# Patient Record
Sex: Male | Born: 1945 | Race: White | Hispanic: No | Marital: Married | Smoking: Former smoker
Health system: Southern US, Community
[De-identification: ages and names within clinical notes are randomized; demographics above are authoritative.]

## PROBLEM LIST (undated history)

## (undated) DIAGNOSIS — J449 Chronic obstructive pulmonary disease, unspecified: Secondary | ICD-10-CM

## (undated) DIAGNOSIS — I6529 Occlusion and stenosis of unspecified carotid artery: Secondary | ICD-10-CM

## (undated) DIAGNOSIS — Z9289 Personal history of other medical treatment: Secondary | ICD-10-CM

## (undated) DIAGNOSIS — E119 Type 2 diabetes mellitus without complications: Secondary | ICD-10-CM

## (undated) DIAGNOSIS — I350 Nonrheumatic aortic (valve) stenosis: Secondary | ICD-10-CM

## (undated) DIAGNOSIS — H269 Unspecified cataract: Secondary | ICD-10-CM

## (undated) DIAGNOSIS — E785 Hyperlipidemia, unspecified: Secondary | ICD-10-CM

## (undated) DIAGNOSIS — E669 Obesity, unspecified: Secondary | ICD-10-CM

## (undated) DIAGNOSIS — I771 Stricture of artery: Secondary | ICD-10-CM

## (undated) DIAGNOSIS — I10 Essential (primary) hypertension: Secondary | ICD-10-CM

## (undated) DIAGNOSIS — I2510 Atherosclerotic heart disease of native coronary artery without angina pectoris: Secondary | ICD-10-CM

## (undated) DIAGNOSIS — N289 Disorder of kidney and ureter, unspecified: Secondary | ICD-10-CM

## (undated) DIAGNOSIS — I4891 Unspecified atrial fibrillation: Secondary | ICD-10-CM

## (undated) DIAGNOSIS — R0600 Dyspnea, unspecified: Secondary | ICD-10-CM

## (undated) HISTORY — DX: Occlusion and stenosis of unspecified carotid artery: I65.29

## (undated) HISTORY — PX: OTHER SURGICAL HISTORY: SHX169

## (undated) HISTORY — DX: Unspecified cataract: H26.9

## (undated) HISTORY — DX: Stricture of artery: I77.1

## (undated) HISTORY — DX: Obesity, unspecified: E66.9

## (undated) HISTORY — DX: Atherosclerotic heart disease of native coronary artery without angina pectoris: I25.10

## (undated) HISTORY — PX: CATARACT EXTRACTION: SUR2

## (undated) HISTORY — DX: Unspecified atrial fibrillation: I48.91

## (undated) HISTORY — PX: CORONARY ARTERY BYPASS GRAFT: SHX141

## (undated) HISTORY — DX: Chronic obstructive pulmonary disease, unspecified: J44.9

## (undated) HISTORY — PX: HERNIA REPAIR: SHX51

## (undated) HISTORY — DX: Disorder of kidney and ureter, unspecified: N28.9

## (undated) HISTORY — DX: Personal history of other medical treatment: Z92.89

## (undated) HISTORY — PX: TONSILLECTOMY: SUR1361

## (undated) HISTORY — DX: Hyperlipidemia, unspecified: E78.5

## (undated) HISTORY — DX: Essential (primary) hypertension: I10

## (undated) HISTORY — DX: Type 2 diabetes mellitus without complications: E11.9

---
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
## 2008-01-11 ENCOUNTER — Emergency Department (HOSPITAL_COMMUNITY): Admission: EM | Admit: 2008-01-11 | Discharge: 2008-01-12 | Payer: Self-pay | Admitting: Emergency Medicine

## 2009-09-29 ENCOUNTER — Ambulatory Visit: Payer: Self-pay | Admitting: Internal Medicine

## 2009-09-29 ENCOUNTER — Inpatient Hospital Stay (HOSPITAL_COMMUNITY): Admission: EM | Admit: 2009-09-29 | Discharge: 2009-10-05 | Payer: Self-pay | Admitting: Emergency Medicine

## 2009-09-30 ENCOUNTER — Ambulatory Visit: Payer: Self-pay | Admitting: Surgery

## 2009-10-07 ENCOUNTER — Telehealth (INDEPENDENT_AMBULATORY_CARE_PROVIDER_SITE_OTHER): Payer: Self-pay | Admitting: *Deleted

## 2009-10-26 ENCOUNTER — Encounter: Payer: Self-pay | Admitting: Cardiovascular Disease

## 2009-10-26 ENCOUNTER — Encounter: Admission: RE | Admit: 2009-10-26 | Discharge: 2009-10-26 | Payer: Self-pay | Admitting: Surgery

## 2009-10-26 ENCOUNTER — Ambulatory Visit: Payer: Self-pay | Admitting: Surgery

## 2009-10-26 DIAGNOSIS — I10 Essential (primary) hypertension: Secondary | ICD-10-CM

## 2009-10-26 DIAGNOSIS — E1159 Type 2 diabetes mellitus with other circulatory complications: Secondary | ICD-10-CM | POA: Insufficient documentation

## 2009-11-10 ENCOUNTER — Ambulatory Visit: Payer: Self-pay | Admitting: Cardiology

## 2009-11-10 DIAGNOSIS — R609 Edema, unspecified: Secondary | ICD-10-CM | POA: Insufficient documentation

## 2009-11-10 DIAGNOSIS — E785 Hyperlipidemia, unspecified: Secondary | ICD-10-CM

## 2009-11-10 DIAGNOSIS — I2510 Atherosclerotic heart disease of native coronary artery without angina pectoris: Secondary | ICD-10-CM | POA: Insufficient documentation

## 2009-11-10 DIAGNOSIS — E1169 Type 2 diabetes mellitus with other specified complication: Secondary | ICD-10-CM | POA: Insufficient documentation

## 2009-12-09 ENCOUNTER — Telehealth (INDEPENDENT_AMBULATORY_CARE_PROVIDER_SITE_OTHER): Payer: Self-pay | Admitting: *Deleted

## 2009-12-14 ENCOUNTER — Telehealth: Payer: Self-pay | Admitting: Cardiology

## 2009-12-15 ENCOUNTER — Ambulatory Visit: Payer: Self-pay | Admitting: Cardiology

## 2010-01-07 ENCOUNTER — Encounter: Payer: Self-pay | Admitting: Cardiology

## 2010-01-11 ENCOUNTER — Telehealth: Payer: Self-pay | Admitting: Cardiology

## 2010-01-12 ENCOUNTER — Ambulatory Visit: Payer: Self-pay | Admitting: Cardiology

## 2010-01-24 ENCOUNTER — Emergency Department (HOSPITAL_COMMUNITY): Admission: EM | Admit: 2010-01-24 | Discharge: 2010-01-24 | Payer: Self-pay | Admitting: Emergency Medicine

## 2010-01-24 ENCOUNTER — Telehealth (INDEPENDENT_AMBULATORY_CARE_PROVIDER_SITE_OTHER): Payer: Self-pay | Admitting: *Deleted

## 2010-01-27 ENCOUNTER — Encounter: Payer: Self-pay | Admitting: Cardiology

## 2010-01-31 ENCOUNTER — Telehealth: Payer: Self-pay | Admitting: Cardiology

## 2010-02-05 ENCOUNTER — Emergency Department (HOSPITAL_COMMUNITY): Admission: EM | Admit: 2010-02-05 | Discharge: 2010-02-05 | Payer: Self-pay | Admitting: Emergency Medicine

## 2010-02-07 ENCOUNTER — Emergency Department (HOSPITAL_COMMUNITY): Admission: EM | Admit: 2010-02-07 | Discharge: 2010-02-07 | Payer: Self-pay | Admitting: Emergency Medicine

## 2010-02-09 ENCOUNTER — Ambulatory Visit: Payer: Self-pay | Admitting: Cardiology

## 2010-02-09 DIAGNOSIS — R229 Localized swelling, mass and lump, unspecified: Secondary | ICD-10-CM | POA: Insufficient documentation

## 2010-03-15 ENCOUNTER — Ambulatory Visit: Payer: Self-pay | Admitting: Surgery

## 2010-07-06 NOTE — Assessment & Plan Note (Signed)
Summary: [facility]   Visit Type:  Follow-up Primary Provider:  Belva Agee, NP  CC:  CAD.  History of Present Illness: The patient presents for followup after CABG. He presented with a non-Q-wave myocardial infarction on April 27. He subsequently was found to have three-vessel coronary artery disease and underwent bypass surgery as described below. He has done relatively well postoperatively although he continues to have significant lower extremity swelling. He has had some mild incisional chest discomfort. He has had no cough fevers or chills. He had none of the chest discomfort that was his previous angina. He denies any palpitations, presyncope or syncope.  Current Medications (verified): 1)  Pravastatin Sodium 40 Mg Tabs (Pravastatin Sodium) .Marland Kitchen.. 1 By Mouth At Bedtime 2)  Klor-Con M20 20 Meq Cr-Tabs (Potassium Chloride Crys Cr) .Marland Kitchen.. 1 By Mouth Daily 3)  Metoprolol Tartrate 25 Mg Tabs (Metoprolol Tartrate) .... 1/2 By Mouth Two Times A Day 4)  Furosemide 40 Mg Tabs (Furosemide) .Marland Kitchen.. 1 By Mouth Daily 5)  Levothroid 25 Mcg Tabs (Levothyroxine Sodium) .Marland Kitchen.. 1 By Mouth Daily  Allergies (verified): No Known Drug Allergies  Past History:  Past Medical History: Hypertension Coronary artery disease Renal insufficiency COPD Obesity  Past Surgical History: Hernia repair Tonsillectomy  CABG( left internal mammary artery   graft to left anterior descending coronary artery, with a saphenous vein   graft to the second obtuse marginal branch of the left circumflex   coronary artery, and a sequential saphenous vein graft to the posterior   descending and posterolateral branches of the right coronary artery.   Endoscopic vein harvesting from the right leg.)  Review of Systems       As stated in the HPI and negative for all other systems.   Vital Signs:  Patient profile:   65 year old male Height:      69 inches Weight:      234 pounds BMI:     34.68 Pulse rate:   76 /  minute Resp:     18 per minute BP sitting:   102 / 68  (right arm)  Vitals Entered By: Marrion Coy, CNA (November 10, 2009 1:39 PM)  Physical Exam  General:  Well developed, well nourished, in no acute distress. Head:  normocephalic and atraumatic Eyes:  PERRLA/EOM intact; conjunctiva and lids normal. Neck:  Neck supple, no JVD. No masses, thyromegaly or abnormal cervical nodes. Chest Wall:  Well-healed sternotomy scar Lungs:  Clear bilaterally to auscultation and percussion. Abdomen:  Bowel sounds positive; abdomen soft and non-tender without masses, organomegaly, or hernias noted. No hepatosplenomegaly, obese Msk:  Back normal, normal gait. Muscle strength and tone normal. Extremities:  moderate to severe bilateral lower extremity edema to the knees Neurologic:  Alert and oriented x 3. Skin:  Intact without lesions or rashes. Cervical Nodes:  no significant adenopathy Psych:  Normal affect.   Detailed Cardiovascular Exam  Neck    Carotids: Carotids full and equal bilaterally without bruits.      Neck Veins: Normal, no JVD.    Heart    Inspection: no deformities or lifts noted.      Palpation: normal PMI with no thrills palpable.      Auscultation: regular rate and rhythm, S1, S2 without murmurs, rubs, gallops, or clicks.    Vascular    Abdominal Aorta: no palpable masses, pulsations, or audible bruits.      Femoral Pulses: normal femoral pulses bilaterally.      Pedal Pulses: normal pedal  pulses bilaterally.      Radial Pulses: normal radial pulses bilaterally.     EKG  Procedure date:  11/10/2009  Findings:      Sinus rhythm, rate 76, axis within normal limits, intervals within normal limits, old anteroseptal infarct  Impression & Recommendations:  Problem # 1:  CORONARY ATHEROSCLEROSIS NATIVE CORONARY ARTERY (ICD-414.01)  The patient is doing well status post CABG. We will continue with risk reduction. He does not want dissipating cardiac  rehabilitation. Orders: EKG w/ Interpretation (93000)  His updated medication list for this problem includes:    Metoprolol Tartrate 25 Mg Tabs (Metoprolol tartrate) .Marland Kitchen... 1/2 by mouth two times a day  Problem # 2:  DYSLIPIDEMIA (ICD-272.4)  his LDL was 168 in the hospital. I doubt that the pravastatin is bringing up to goal but I will check this with a fasting lipid profile. His updated medication list for this problem includes:    Pravastatin Sodium 40 Mg Tabs (Pravastatin sodium) .Marland Kitchen... 1 by mouth at bedtime  His updated medication list for this problem includes:    Pravastatin Sodium 40 Mg Tabs (Pravastatin sodium) .Marland Kitchen... 1 by mouth at bedtime  Problem # 3:  EDEMA (ICD-782.3) I will switch him to torsemide 20 mg daily with potassium 10 mL. He'll get compression stockings. I instructed him on how to keep his feet elevated. I will check a basic metabolic profile in one   Patient Instructions: 1)  Your physician recommends that you schedule a follow-up appointment in: 1 month with Dr Antoine Poche 2)  Your physician recommends that you return for lab work in: 10 days BMP, fasting lipid and liver 3)  Your physician has recommended you make the following change in your medication: Stop Furosemide and start Torsemide 20 mg a day  dedrease potassium chlo to 10 MEq a day Prescriptions: JOBST KNEE HIGH COMPRESSION SM  MISC (ELASTIC BANDAGES & SUPPORTS) 20 to 30 mm/hg  #1 x 0   Entered by:   Charolotte Capuchin, RN   Authorized by:   Rollene Rotunda, MD, [facility]   Signed by:   Charolotte Capuchin, RN on 11/10/2009   Method used:   Electronically to        [facility]  [address] (retail)       [address]       [place], [place]  [address]       Ph: [phone]       Fax: [phone]   RxID:   [id number] TORSEMIDE 20 MG TABS (TORSEMIDE) one daily  #30 x 11   Entered by:   Charolotte Capuchin, RN   Authorized by:   Rollene Rotunda, MD, [facility]   Signed by:   Charolotte Capuchin, RN on 11/10/2009   Method used:   Electronically to        [facility]  [address] (retail)       [address]       [place], [place]  [address]       Ph: [phone]       Fax: [phone]   RxID:   [id number]

## 2010-07-06 NOTE — Progress Notes (Signed)
Summary: work note  Phone Note Call from Patient Call back at [employer] [phone]   Caller: Spouse/Lois Reason for Call: Talk to Nurse Summary of Call: wife needs a note for work.... she is going to be out from 5/3-5/19, while caring for her husband.  Works @ [employer] Initial call taken by: Migdalia Dk,  Oct 07, 2009 9:31 AM  Follow-up for Phone Call        Spoke with wife about leave of absence letter. Advised her to contact Dr. Laneta Simmers office as her husband was just discharged 2 days ago following CABG.  Pt will see Dr. Antoine Poche as 11/03/2009 in [place].  She understands. Lisabeth Devoid RN

## 2010-07-06 NOTE — Progress Notes (Signed)
  Request Recieved from DDS forwarded to [facility]  December 09, 2009 11:19 AM    Appended Document:  Recieved Request from DDS sent to [facility]

## 2010-07-06 NOTE — Letter (Signed)
Summary: [facility]   [facility]   Imported By: Roderic Ovens 12/18/2009 11:29:10  _____________________________________________________________________  External Attachment:    Type:   Image     Comment:   External Document

## 2010-07-06 NOTE — Assessment & Plan Note (Signed)
Summary: [facility]   Visit Type:  Follow-up Primary Provider:  Belva Agee, NP  CC:  Edema and CAD.  History of Present Illness: The patient presents for followup of his lower extremity edema. He had this at the last visit and I increased his diuretics were short period. He had followup laboratories included a normal basic metabolic profile. He also had a lipid profile done recently demonstrates his LDL to be 187 with an HDL of 34. He presents today for followup of the edema. He has not had any chest pressure, neck or upper arm discomfort. He has not had any palpitations, presyncope or syncope. He's had no new shortness of breath, PND or orthopnea.  Of note he has some swelling under his left elbow. He has been to 3 emergency room doctors apparently and had x-rays. The diagnosis has not been forthcoming. He's had some swelling in the antecubital fossa and some discomfort but no fevers or chills. He has no trauma to this area.  Current Medications (verified): 1)  Pravastatin Sodium 40 Mg Tabs (Pravastatin Sodium) .Marland Kitchen.. 1 By Mouth At Bedtime 2)  Klor-Con M20 20 Meq Cr-Tabs (Potassium Chloride Crys Cr) .Marland Kitchen.. 1 By Mouth Daily 3)  Metoprolol Tartrate 25 Mg Tabs (Metoprolol Tartrate) .... 1/2 By Mouth Two Times A Day 4)  Levothroid 25 Mcg Tabs (Levothyroxine Sodium) .Marland Kitchen.. 1 By Mouth Daily 5)  Torsemide 20 Mg Tabs (Torsemide) .... One Daily 6)  Jobst Knee High Compression Sm  Misc (Elastic Bandages & Supports) .... 20 To 30 Mm/hg  Allergies (verified): No Known Drug Allergies  Past History:  Past Medical History: Reviewed history from 11/10/2009 and no changes required. Hypertension Coronary artery disease Renal insufficiency COPD Obesity  Past Surgical History: Reviewed history from 11/10/2009 and no changes required. Hernia repair Tonsillectomy  CABG( left internal mammary artery   graft to left anterior descending coronary artery, with a saphenous vein   graft to the second  obtuse marginal branch of the left circumflex   coronary artery, and a sequential saphenous vein graft to the posterior   descending and posterolateral branches of the right coronary artery.   Endoscopic vein harvesting from the right leg.)  Review of Systems       As stated in the HPI and negative for all other systems.   Vital Signs:  Patient profile:   65 year old male Height:      69 inches Weight:      253 pounds BMI:     37.50 Pulse rate:   72 / minute Resp:     18 per minute BP sitting:   120 / 68  (right arm)  Vitals Entered By: Marrion Coy, CNA (February 09, 2010 2:40 PM)  Physical Exam  General:  Well developed, well nourished, in no acute distress. Head:  normocephalic and atraumatic Eyes:  PERRLA/EOM intact; conjunctiva and lids normal. Mouth:  edentulous.. Oral mucosa normal. Neck:  Neck supple, no JVD. No masses, thyromegaly or abnormal cervical nodes. Chest Wall:  Well-healed sternotomy scar Lungs:  Clear bilaterally to auscultation and percussion. Abdomen:  Bowel sounds positive; abdomen soft and non-tender without masses, organomegaly, or hernias noted. No hepatosplenomegaly, obese Msk:  Back normal, normal gait. Muscle strength and tone normal, There is a firm 3 x 7 cm swelling under the left elbow that is nonfluctuant and nontender Extremities:  Mildto severe bilateral lower extremity edema to the knees Neurologic:  Alert and oriented x 3. Skin:  Intact without lesions or  rashes. Cervical Nodes:  no significant adenopathy Inguinal Nodes:  no significant adenopathy Psych:  Normal affect.   Detailed Cardiovascular Exam  Neck    Carotids: Carotids full and equal bilaterally without bruits.      Neck Veins: Normal, no JVD.    Heart    Inspection: no deformities or lifts noted.      Palpation: normal PMI with no thrills palpable.      Auscultation: regular rate and rhythm, S1, S2 without murmurs, rubs, gallops, or clicks.    Vascular    Abdominal  Aorta: no palpable masses, pulsations, or audible bruits.      Femoral Pulses: normal femoral pulses bilaterally.      Pedal Pulses: normal pedal pulses bilaterally.      Radial Pulses: normal radial pulses bilaterally.     Impression & Recommendations:  Problem # 1:  EDEMA (ICD-782.3) His edema is much improved. He will continue on the meds as listed above in salt and keeping his feet elevated. He will wear his compression stockings.  Problem # 2:  DYSLIPIDEMIA (ICD-272.4) His lipid profile is not at target. He can only afford generics. I will switch him to simvastatin 40 mg daily and check a lipid profile with liver enzymes in 10 weeks.  Problem # 3:  HYPERTENSION (ICD-401.9) His blood pressure is controlled. He'll continue the meds as listed.  Problem # 4:  CORONARY ATHEROSCLEROSIS NATIVE CORONARY ARTERY (ICD-414.01) He has no new symptoms. He will continue the meds as listed.  Problem # 5:  LOCALIZED SUPERFICIAL SWELLING MASS OR LUMP (ICD-782.2) I have suggested that he followup with an orthopedist and then an MRI might be indicated.  Patient Instructions: 1)  Your physician recommends that you schedule a follow-up appointment in: 6 months with Dr Antoine Poche 2)  Your physician recommends that you return for a FASTING lipid and liver profile: in 10 weeks 3)  Your physician has recommended you make the following change in your medication: Stop Pravastatin and start Simvastatin 40 mg one a day Prescriptions: SIMVASTATIN 40 MG TABS (SIMVASTATIN) one daily  #90 x 3   Entered by:   Charolotte Capuchin, RN   Authorized by:   Rollene Rotunda, MD, [facility]   Signed by:   Charolotte Capuchin, RN on 02/09/2010   Method used:   Electronically to        [facility]  [address] (retail)       [address]       [place], [place]  [address]       Ph: [phone]       Fax: [phone]   RxID:   [id number]  I have reviewed and approved all prescriptions at the time of this  visit. Rollene Rotunda, MD, [facility]  February 09, 2010 3:21 PM

## 2010-07-06 NOTE — Progress Notes (Signed)
Summary: pt needs to have dental work done  Phone Note Call from Patient Call back at [employer] [phone]   Caller: Patient Reason for Call: Talk to Nurse, Talk to Doctor Summary of Call: pt needs to have teeth pulled and needs approval and needs to know if he needs pre meds Initial call taken by: Omer Jack,  December 14, 2009 4:01 PM  Follow-up for Phone Call        [facility] for surgery.  No need for SBE prophylaxis. Follow-up by: Rollene Rotunda, MD, [facility],  December 14, 2009 6:01 PM  Additional Follow-up for Phone Call Additional follow up Details #1::        pt aware of Dr Shontez Sermon's orders and states understanding Additional Follow-up by: Charolotte Capuchin, RN,  December 14, 2009 6:11 PM

## 2010-07-06 NOTE — Progress Notes (Signed)
  DDS request recieved, sent to [facility]. Cala Bradford Mesiemore  January 24, 2010 9:16 AM

## 2010-07-06 NOTE — Progress Notes (Signed)
Summary: pt having chest pain  Phone Note Call from Patient Call back at Home Phone [phone]   Caller: Patient Reason for Call: Talk to Nurse, Talk to Doctor Summary of Call: pt having chest pain and numdness in hands and arms Initial call taken by: Omer Jack,  January 11, 2010 11:52 AM  Follow-up for Phone Call        has been having chest pain for the past week, comes and goes described as "tightness"  doesn't feel he needs to go to ER, pt also complains that his chest swells up sometimes, he feels the left side gets bigger than the right, sch appt w/Dr Antoine Poche tom in [facility], RN  January 11, 2010 12:02 PM

## 2010-07-06 NOTE — Assessment & Plan Note (Signed)
Summary: [facility]   Visit Type:  Follow-up Primary Drayden Lukas:  Belva Agee, NP  CC:  CAD.  History of Present Illness: The patient presents for followup of his known coronary disease. He is status post CABG. Since I last saw him he has had none of the chest pain that was his previous angina. He has had some atypical chest discomfort some associated with movement or position possibly related to his sternal incision. He continues to have dyspnea which is episodic. He is not describing PND or orthopnea. He does have increased lower extremity swelling compared with previous. He does not keep his feet elevated during the day. He doesn't add salt but he doesn't avoid it either.  Current Medications (verified): 1)  Pravastatin Sodium 40 Mg Tabs (Pravastatin Sodium) .Marland Kitchen.. 1 By Mouth At Bedtime 2)  Klor-Con M20 20 Meq Cr-Tabs (Potassium Chloride Crys Cr) .Marland Kitchen.. 1 By Mouth Daily 3)  Metoprolol Tartrate 25 Mg Tabs (Metoprolol Tartrate) .... 1/2 By Mouth Two Times A Day 4)  Levothroid 25 Mcg Tabs (Levothyroxine Sodium) .Marland Kitchen.. 1 By Mouth Daily 5)  Torsemide 20 Mg Tabs (Torsemide) .... One Daily 6)  Jobst Knee High Compression Sm  Misc (Elastic Bandages & Supports) .... 20 To 30 Mm/hg  Allergies (verified): No Known Drug Allergies  Past History:  Past Medical History: Reviewed history from 11/10/2009 and no changes required. Hypertension Coronary artery disease Renal insufficiency COPD Obesity  Past Surgical History: Reviewed history from 11/10/2009 and no changes required. Hernia repair Tonsillectomy  CABG( left internal mammary artery   graft to left anterior descending coronary artery, with a saphenous vein   graft to the second obtuse marginal branch of the left circumflex   coronary artery, and a sequential saphenous vein graft to the posterior   descending and posterolateral branches of the right coronary artery.   Endoscopic vein harvesting from the right leg.)  Review of  Systems       As stated in the HPI and negative for all other systems.   Vital Signs:  Patient profile:   65 year old male Height:      69 inches Weight:      251 pounds BMI:     37.20 Pulse rate:   69 / minute Resp:     20 per minute BP sitting:   110 / 84  (right arm)  Vitals Entered By: Marrion Coy, CNA (January 12, 2010 1:55 PM)  Physical Exam  General:  Well developed, well nourished, in no acute distress. Head:  normocephalic and atraumatic Eyes:  PERRLA/EOM intact; conjunctiva and lids normal. Mouth:  edentulous.. Oral mucosa normal. Neck:  Neck supple, no JVD. No masses, thyromegaly or abnormal cervical nodes. Chest Wall:  Well-healed sternotomy scar Lungs:  Clear bilaterally to auscultation and percussion. Abdomen:  Bowel sounds positive; abdomen soft and non-tender without masses, organomegaly, or hernias noted. No hepatosplenomegaly, obese Msk:  Back normal, normal gait. Muscle strength and tone normal. Extremities:  moderate to severe bilateral lower extremity edema to the knees Neurologic:  Alert and oriented x 3. Skin:  Intact without lesions or rashes. Cervical Nodes:  no significant adenopathy Inguinal Nodes:  no significant adenopathy Psych:  Normal affect.   Detailed Cardiovascular Exam  Neck    Carotids: Carotids full and equal bilaterally without bruits.      Neck Veins: Normal, no JVD.    Heart    Inspection: no deformities or lifts noted.      Palpation: normal PMI with no  thrills palpable.      Auscultation: regular rate and rhythm, S1, S2 without murmurs, rubs, gallops, or clicks.    Vascular    Abdominal Aorta: no palpable masses, pulsations, or audible bruits.      Femoral Pulses: normal femoral pulses bilaterally.      Pedal Pulses: normal pedal pulses bilaterally.      Radial Pulses: normal radial pulses bilaterally.     EKG  Procedure date:  01/12/2010  Findings:      Sinus rhythm, rate 69, axis within normal limits, intervals  within normal limits, premature atrial contractions, nonspecific T-wave flattening.  Impression & Recommendations:  Problem # 1:  CORONARY ATHEROSCLEROSIS NATIVE CORONARY ARTERY (ICD-414.01) The patient has no new chest pain consistent with angina. He will continue with risk reduction. Orders: EKG w/ Interpretation (93000)  Problem # 2:  DYSLIPIDEMIA (ICD-272.4)  When he comes back L. make sure he has a fasting lipid profile and dose adjustment of his statin to get his LDL less than 100 and HDL greater than 40.  Problem # 3:  HYPERTENSION (ICD-401.9)  His blood pressure is controlled. He will continue the meds as listed. Orders: EKG w/ Interpretation (93000)  His updated medication list for this problem includes:    Metoprolol Tartrate 25 Mg Tabs (Metoprolol tartrate) .Marland Kitchen... 1/2 by mouth two times a day    Torsemide 20 Mg Tabs (Torsemide) ..... One daily  Problem # 4:  EDEMA (ICD-782.3) He has continued lower extremity edema. We discussed salt restriction and keeping his feet elevated. For 3 days he will take torsemide 40 mg and double his potassium. He will get a basic metabolic profile after this and go back to 20 mg daily  Patient Instructions: 1)  Your physician recommends that you schedule a follow-up appointment October 19, 2011at 12N 2)  Your physician has recommended you make the following change in your medication: for the next three days, double your Torsemide and take one whole tablet of potassium chlo.  Have basic metabolic panel in 2 weeks  401.1  v58.69

## 2010-07-06 NOTE — Progress Notes (Signed)
Summary: pt needs refill  Phone Note Refill Request Call back at Home Phone [phone] Message from:  Patient on [facility]  Refills Requested: Medication #1:  KLOR-CON M20 20 MEQ CR-TABS 1 by mouth daily Initial call taken by: Omer Jack,  January 31, 2010 12:39 PM  Follow-up for Phone Call        RX sent into pharmacy. Pt notified Marrion Coy, CNA  January 31, 2010 1:00 PM  Follow-up by: Marrion Coy, CNA,  January 31, 2010 1:00 PM    Prescriptions: KLOR-CON M20 20 MEQ CR-TABS (POTASSIUM CHLORIDE CRYS CR) 1 by mouth daily  #30 x 6   Entered by:   Marrion Coy, CNA   Authorized by:   Rollene Rotunda, MD, [facility]   Signed by:   Marrion Coy, CNA on 01/31/2010   Method used:   Electronically to        [facility]  [address] (retail)       [address]       [place], [place]  [address]       Ph: [phone]       Fax: [phone]   RxID:   [id number]

## 2010-08-17 ENCOUNTER — Encounter: Payer: Self-pay | Admitting: Cardiology

## 2010-08-17 ENCOUNTER — Ambulatory Visit (INDEPENDENT_AMBULATORY_CARE_PROVIDER_SITE_OTHER): Payer: Self-pay | Admitting: Cardiology

## 2010-08-17 DIAGNOSIS — R609 Edema, unspecified: Secondary | ICD-10-CM

## 2010-08-17 DIAGNOSIS — I2510 Atherosclerotic heart disease of native coronary artery without angina pectoris: Secondary | ICD-10-CM

## 2010-08-22 ENCOUNTER — Telehealth: Payer: Self-pay | Admitting: Cardiology

## 2010-08-23 LAB — BASIC METABOLIC PANEL
BUN: 13 mg/dL (ref 6–23)
BUN: 17 mg/dL (ref 6–23)
BUN: 11 mg/dL (ref 6–23)
BUN: 16 mg/dL (ref 6–23)
BUN: 7 mg/dL (ref 6–23)
CO2: 29 mEq/L (ref 19–32)
CO2: 33 mEq/L — ABNORMAL HIGH (ref 19–32)
CO2: 24 mEq/L (ref 19–32)
CO2: 25 mEq/L (ref 19–32)
CO2: 26 mEq/L (ref 19–32)
Calcium: 8.2 mg/dL — ABNORMAL LOW (ref 8.4–10.5)
Calcium: 8.2 mg/dL — ABNORMAL LOW (ref 8.4–10.5)
Calcium: 7.8 mg/dL — ABNORMAL LOW (ref 8.4–10.5)
Calcium: 8.6 mg/dL (ref 8.4–10.5)
Calcium: 8.8 mg/dL (ref 8.4–10.5)
Chloride: 102 mEq/L (ref 96–112)
Chloride: 99 mEq/L (ref 96–112)
Chloride: 105 mEq/L (ref 96–112)
Chloride: 106 mEq/L (ref 96–112)
Chloride: 109 mEq/L (ref 96–112)
Creatinine, Ser: 0.9 mg/dL (ref 0.4–1.5)
Creatinine, Ser: 0.98 mg/dL (ref 0.4–1.5)
Creatinine, Ser: 0.85 mg/dL (ref 0.4–1.5)
Creatinine, Ser: 0.88 mg/dL (ref 0.4–1.5)
Creatinine, Ser: 0.98 mg/dL (ref 0.4–1.5)
GFR calc Af Amer: >60 mL/min (ref >60)
GFR calc Af Amer: >60 mL/min (ref >60)
GFR calc Af Amer: >60 mL/min (ref >60)
GFR calc Af Amer: >60 mL/min (ref >60)
GFR calc Af Amer: >60 mL/min (ref >60)
GFR calc non Af Amer: >60 mL/min (ref >60)
GFR calc non Af Amer: >60 mL/min (ref >60)
GFR calc non Af Amer: >60 mL/min (ref >60)
GFR calc non Af Amer: >60 mL/min (ref >60)
GFR calc non Af Amer: >60 mL/min (ref >60)
Glucose, Bld: 121 mg/dL — ABNORMAL HIGH (ref 70–99)
Glucose, Bld: 151 mg/dL — ABNORMAL HIGH (ref 70–99)
Glucose, Bld: 119 mg/dL — ABNORMAL HIGH (ref 70–99)
Glucose, Bld: 119 mg/dL — ABNORMAL HIGH (ref 70–99)
Glucose, Bld: 129 mg/dL — ABNORMAL HIGH (ref 70–99)
Potassium: 3.9 mEq/L (ref 3.5–5.1)
Potassium: 4.3 mEq/L (ref 3.5–5.1)
Potassium: 3.8 mEq/L (ref 3.5–5.1)
Potassium: 4 mEq/L (ref 3.5–5.1)
Potassium: 4.5 mEq/L (ref 3.5–5.1)
Sodium: 137 mEq/L (ref 135–145)
Sodium: 139 mEq/L (ref 135–145)
Sodium: 137 mEq/L (ref 135–145)
Sodium: 138 mEq/L (ref 135–145)
Sodium: 139 mEq/L (ref 135–145)

## 2010-08-23 LAB — CBC
HCT: 35.9 % — ABNORMAL LOW (ref 39.0–52.0)
HCT: 36 % — ABNORMAL LOW (ref 39.0–52.0)
HCT: 31.5 % — ABNORMAL LOW (ref 39.0–52.0)
HCT: 32.4 % — ABNORMAL LOW (ref 39.0–52.0)
HCT: 35.9 % — ABNORMAL LOW (ref 39.0–52.0)
HCT: 39.3 % (ref 39.0–52.0)
HCT: 40.5 % (ref 39.0–52.0)
HCT: 42.7 % (ref 39.0–52.0)
Hemoglobin: 12.6 g/dL — ABNORMAL LOW (ref 13.0–17.0)
Hemoglobin: 12.6 g/dL — ABNORMAL LOW (ref 13.0–17.0)
Hemoglobin: 11 g/dL — ABNORMAL LOW (ref 13.0–17.0)
Hemoglobin: 11.4 g/dL — ABNORMAL LOW (ref 13.0–17.0)
Hemoglobin: 12.7 g/dL — ABNORMAL LOW (ref 13.0–17.0)
Hemoglobin: 13.5 g/dL (ref 13.0–17.0)
Hemoglobin: 14.2 g/dL (ref 13.0–17.0)
Hemoglobin: 15 g/dL (ref 13.0–17.0)
MCHC: 34.9 g/dL (ref 30.0–36.0)
MCHC: 35.1 g/dL (ref 30.0–36.0)
MCHC: 34.3 g/dL (ref 30.0–36.0)
MCHC: 34.9 g/dL (ref 30.0–36.0)
MCHC: 35 g/dL (ref 30.0–36.0)
MCHC: 35.1 g/dL (ref 30.0–36.0)
MCHC: 35.3 g/dL (ref 30.0–36.0)
MCHC: 35.3 g/dL (ref 30.0–36.0)
MCV: 91.9 fL (ref 78.0–100.0)
MCV: 92.1 fL (ref 78.0–100.0)
MCV: 91 fL (ref 78.0–100.0)
MCV: 91.2 fL (ref 78.0–100.0)
MCV: 91.3 fL (ref 78.0–100.0)
MCV: 91.4 fL (ref 78.0–100.0)
MCV: 91.7 fL (ref 78.0–100.0)
MCV: 92.3 fL (ref 78.0–100.0)
Platelets: 138 10*3/uL — ABNORMAL LOW (ref 150–400)
Platelets: 144 10*3/uL — ABNORMAL LOW (ref 150–400)
Platelets: 125 10*3/uL — ABNORMAL LOW (ref 150–400)
Platelets: 126 10*3/uL — ABNORMAL LOW (ref 150–400)
Platelets: 139 10*3/uL — ABNORMAL LOW (ref 150–400)
Platelets: 140 10*3/uL — ABNORMAL LOW (ref 150–400)
Platelets: 143 10*3/uL — ABNORMAL LOW (ref 150–400)
Platelets: 147 10*3/uL — ABNORMAL LOW (ref 150–400)
RBC: 3.91 MIL/uL — ABNORMAL LOW (ref 4.22–5.81)
RBC: 3.91 MIL/uL — ABNORMAL LOW (ref 4.22–5.81)
RBC: 3.42 MIL/uL — ABNORMAL LOW (ref 4.22–5.81)
RBC: 3.55 MIL/uL — ABNORMAL LOW (ref 4.22–5.81)
RBC: 3.93 MIL/uL — ABNORMAL LOW (ref 4.22–5.81)
RBC: 4.29 MIL/uL (ref 4.22–5.81)
RBC: 4.44 MIL/uL (ref 4.22–5.81)
RBC: 4.69 MIL/uL (ref 4.22–5.81)
RDW: 13.8 % (ref 11.5–15.5)
RDW: 13.8 % (ref 11.5–15.5)
RDW: 13.4 % (ref 11.5–15.5)
RDW: 13.5 % (ref 11.5–15.5)
RDW: 13.5 % (ref 11.5–15.5)
RDW: 13.6 % (ref 11.5–15.5)
RDW: 13.6 % (ref 11.5–15.5)
RDW: 13.7 % (ref 11.5–15.5)
WBC: 12.1 10*3/uL — ABNORMAL HIGH (ref 4.0–10.5)
WBC: 12.1 10*3/uL — ABNORMAL HIGH (ref 4.0–10.5)
WBC: 12.5 10*3/uL — ABNORMAL HIGH (ref 4.0–10.5)
WBC: 7 10*3/uL (ref 4.0–10.5)
WBC: 8.1 10*3/uL (ref 4.0–10.5)
WBC: 8.4 10*3/uL (ref 4.0–10.5)
WBC: 8.6 10*3/uL (ref 4.0–10.5)
WBC: 8.6 10*3/uL (ref 4.0–10.5)

## 2010-08-23 LAB — APTT
aPTT: 32 seconds (ref 24–37)
aPTT: 47 seconds — ABNORMAL HIGH (ref 24–37)

## 2010-08-23 LAB — HEMOGLOBIN A1C
Hgb A1c MFr Bld: 5.9 % — ABNORMAL HIGH (ref <5.7)
Hgb A1c MFr Bld: 5.9 % — ABNORMAL HIGH (ref <5.7)
Hgb A1c MFr Bld: 6.1 % — ABNORMAL HIGH (ref <5.7)
Mean Plasma Glucose: 123 mg/dL — ABNORMAL HIGH (ref <117)
Mean Plasma Glucose: 123 mg/dL — ABNORMAL HIGH (ref <117)
Mean Plasma Glucose: 128 mg/dL — ABNORMAL HIGH (ref <117)

## 2010-08-23 LAB — COMPREHENSIVE METABOLIC PANEL
ALT: 22 U/L (ref 0–53)
AST: 21 U/L (ref 0–37)
Albumin: 3.5 g/dL (ref 3.5–5.2)
Alkaline Phosphatase: 60 U/L (ref 39–117)
BUN: 12 mg/dL (ref 6–23)
CO2: 25 mEq/L (ref 19–32)
Calcium: 8.3 mg/dL — ABNORMAL LOW (ref 8.4–10.5)
Chloride: 107 mEq/L (ref 96–112)
Creatinine, Ser: 0.87 mg/dL (ref 0.4–1.5)
GFR calc Af Amer: >60 mL/min (ref >60)
GFR calc non Af Amer: >60 mL/min (ref >60)
Glucose, Bld: 102 mg/dL — ABNORMAL HIGH (ref 70–99)
Potassium: 3.6 mEq/L (ref 3.5–5.1)
Sodium: 136 mEq/L (ref 135–145)
Total Bilirubin: 0.7 mg/dL (ref 0.3–1.2)
Total Protein: 6.1 g/dL (ref 6.0–8.3)

## 2010-08-23 LAB — POCT I-STAT 3, VENOUS BLOOD GAS (G3P V)
Acid-base deficit: 2 mmol/L (ref 0.0–2.0)
Bicarbonate: 23.3 mEq/L (ref 20.0–24.0)
O2 Saturation: 72 %
TCO2: 25 mmol/L (ref 0–100)
pCO2, Ven: 42.7 mmHg — ABNORMAL LOW (ref 45.0–50.0)
pH, Ven: 7.345 — ABNORMAL HIGH (ref 7.250–7.300)
pO2, Ven: 40 mmHg (ref 30.0–45.0)

## 2010-08-23 LAB — URINALYSIS, ROUTINE W REFLEX MICROSCOPIC
Bilirubin Urine: NEGATIVE
Glucose, UA: NEGATIVE mg/dL
Hgb urine dipstick: NEGATIVE
Ketones, ur: NEGATIVE mg/dL
Nitrite: NEGATIVE
Protein, ur: NEGATIVE mg/dL
Specific Gravity, Urine: 1.017 (ref 1.005–1.030)
Urobilinogen, UA: 1 mg/dL (ref 0.0–1.0)
pH: 6 (ref 5.0–8.0)

## 2010-08-23 LAB — TROPONIN I: Troponin I: 0.32 ng/mL — ABNORMAL HIGH (ref 0.00–0.06)

## 2010-08-23 LAB — BLOOD GAS, ARTERIAL
Acid-Base Excess: 0.8 mmol/L (ref 0.0–2.0)
Bicarbonate: 25 mEq/L — ABNORMAL HIGH (ref 20.0–24.0)
FIO2: 0.21 %
O2 Saturation: 93.9 %
Patient temperature: 98.6
TCO2: 26.2 mmol/L (ref 0–100)
pCO2 arterial: 40.7 mmHg (ref 35.0–45.0)
pH, Arterial: 7.405 (ref 7.350–7.450)
pO2, Arterial: 68.1 mmHg — ABNORMAL LOW (ref 80.0–100.0)

## 2010-08-23 LAB — POCT CARDIAC MARKERS
CKMB, poc: 3 ng/mL (ref 1.0–8.0)
CKMB, poc: 3.3 ng/mL (ref 1.0–8.0)
Myoglobin, poc: 102 ng/mL (ref 12–200)
Myoglobin, poc: 75 ng/mL (ref 12–200)
Troponin i, poc: 0.06 ng/mL (ref 0.00–0.09)
Troponin i, poc: 0.13 ng/mL — ABNORMAL HIGH (ref 0.00–0.09)

## 2010-08-23 LAB — HEMOGLOBIN AND HEMATOCRIT, BLOOD
HCT: 28 % — ABNORMAL LOW (ref 39.0–52.0)
Hemoglobin: 9.9 g/dL — ABNORMAL LOW (ref 13.0–17.0)

## 2010-08-23 LAB — LIPID PANEL
Cholesterol: 218 mg/dL — ABNORMAL HIGH (ref 0–200)
Cholesterol: 230 mg/dL — ABNORMAL HIGH (ref 0–200)
HDL: 33 mg/dL — ABNORMAL LOW (ref >39)
HDL: 37 mg/dL — ABNORMAL LOW (ref >39)
LDL Cholesterol: 165 mg/dL — ABNORMAL HIGH (ref 0–99)
LDL Cholesterol: 182 mg/dL — ABNORMAL HIGH (ref 0–99)
Total CHOL/HDL Ratio: 5.9 RATIO
Total CHOL/HDL Ratio: 7 RATIO
Triglycerides: 76 mg/dL (ref <150)
Triglycerides: 82 mg/dL (ref <150)
VLDL: 15 mg/dL (ref 0–40)
VLDL: 16 mg/dL (ref 0–40)

## 2010-08-23 LAB — POCT I-STAT 3, ART BLOOD GAS (G3+)
Acid-Base Excess: 1 mmol/L (ref 0.0–2.0)
Acid-Base Excess: 2 mmol/L (ref 0.0–2.0)
Acid-base deficit: 1 mmol/L (ref 0.0–2.0)
Acid-base deficit: 1 mmol/L (ref 0.0–2.0)
Bicarbonate: 24 mEq/L (ref 20.0–24.0)
Bicarbonate: 24.2 mEq/L — ABNORMAL HIGH (ref 20.0–24.0)
Bicarbonate: 25 mEq/L — ABNORMAL HIGH (ref 20.0–24.0)
Bicarbonate: 25.5 mEq/L — ABNORMAL HIGH (ref 20.0–24.0)
Bicarbonate: 25.6 mEq/L — ABNORMAL HIGH (ref 20.0–24.0)
Bicarbonate: 26.4 mEq/L — ABNORMAL HIGH (ref 20.0–24.0)
O2 Saturation: 100 %
O2 Saturation: 100 %
O2 Saturation: 83 %
O2 Saturation: 90 %
O2 Saturation: 96 %
O2 Saturation: 98 %
Patient temperature: 35.2
Patient temperature: 37.2
TCO2: 25 mmol/L (ref 0–100)
TCO2: 25 mmol/L (ref 0–100)
TCO2: 26 mmol/L (ref 0–100)
TCO2: 27 mmol/L (ref 0–100)
TCO2: 27 mmol/L (ref 0–100)
TCO2: 28 mmol/L (ref 0–100)
pCO2 arterial: 37.7 mmHg (ref 35.0–45.0)
pCO2 arterial: 38 mmHg (ref 35.0–45.0)
pCO2 arterial: 38.4 mmHg (ref 35.0–45.0)
pCO2 arterial: 38.6 mmHg (ref 35.0–45.0)
pCO2 arterial: 40.5 mmHg (ref 35.0–45.0)
pCO2 arterial: 45.8 mmHg — ABNORMAL HIGH (ref 35.0–45.0)
pH, Arterial: 7.357 (ref 7.350–7.450)
pH, Arterial: 7.385 (ref 7.350–7.450)
pH, Arterial: 7.403 (ref 7.350–7.450)
pH, Arterial: 7.42 (ref 7.350–7.450)
pH, Arterial: 7.43 (ref 7.350–7.450)
pH, Arterial: 7.449 (ref 7.350–7.450)
pO2, Arterial: 259 mmHg — ABNORMAL HIGH (ref 80.0–100.0)
pO2, Arterial: 308 mmHg — ABNORMAL HIGH (ref 80.0–100.0)
pO2, Arterial: 48 mmHg — ABNORMAL LOW (ref 80.0–100.0)
pO2, Arterial: 52 mmHg — ABNORMAL LOW (ref 80.0–100.0)
pO2, Arterial: 84 mmHg (ref 80.0–100.0)
pO2, Arterial: 97 mmHg (ref 80.0–100.0)

## 2010-08-23 LAB — PREPARE PLATELETS

## 2010-08-23 LAB — CREATININE, SERUM
Creatinine, Ser: 0.93 mg/dL (ref 0.4–1.5)
GFR calc Af Amer: >60 mL/min (ref >60)
GFR calc non Af Amer: >60 mL/min (ref >60)

## 2010-08-23 LAB — POCT I-STAT 4, (NA,K, GLUC, HGB,HCT)
Glucose, Bld: 101 mg/dL — ABNORMAL HIGH (ref 70–99)
Glucose, Bld: 121 mg/dL — ABNORMAL HIGH (ref 70–99)
Glucose, Bld: 122 mg/dL — ABNORMAL HIGH (ref 70–99)
Glucose, Bld: 129 mg/dL — ABNORMAL HIGH (ref 70–99)
Glucose, Bld: 92 mg/dL (ref 70–99)
HCT: 27 % — ABNORMAL LOW (ref 39.0–52.0)
HCT: 28 % — ABNORMAL LOW (ref 39.0–52.0)
HCT: 29 % — ABNORMAL LOW (ref 39.0–52.0)
HCT: 35 % — ABNORMAL LOW (ref 39.0–52.0)
HCT: 38 % — ABNORMAL LOW (ref 39.0–52.0)
Hemoglobin: 11.9 g/dL — ABNORMAL LOW (ref 13.0–17.0)
Hemoglobin: 12.9 g/dL — ABNORMAL LOW (ref 13.0–17.0)
Hemoglobin: 9.2 g/dL — ABNORMAL LOW (ref 13.0–17.0)
Hemoglobin: 9.5 g/dL — ABNORMAL LOW (ref 13.0–17.0)
Hemoglobin: 9.9 g/dL — ABNORMAL LOW (ref 13.0–17.0)
Potassium: 4 mEq/L (ref 3.5–5.1)
Potassium: 4.1 mEq/L (ref 3.5–5.1)
Potassium: 4.5 mEq/L (ref 3.5–5.1)
Potassium: 5 mEq/L (ref 3.5–5.1)
Potassium: 5.4 mEq/L — ABNORMAL HIGH (ref 3.5–5.1)
Sodium: 135 mEq/L (ref 135–145)
Sodium: 136 mEq/L (ref 135–145)
Sodium: 138 mEq/L (ref 135–145)
Sodium: 138 mEq/L (ref 135–145)
Sodium: 139 mEq/L (ref 135–145)

## 2010-08-23 LAB — DIFFERENTIAL
Basophils Absolute: 0 10*3/uL (ref 0.0–0.1)
Basophils Relative: 0 % (ref 0–1)
Eosinophils Absolute: 0.3 10*3/uL (ref 0.0–0.7)
Eosinophils Relative: 4 % (ref 0–5)
Lymphocytes Relative: 17 % (ref 12–46)
Lymphs Abs: 1.4 10*3/uL (ref 0.7–4.0)
Monocytes Absolute: 0.4 10*3/uL (ref 0.1–1.0)
Monocytes Relative: 5 % (ref 3–12)
Neutro Abs: 6.2 10*3/uL (ref 1.7–7.7)
Neutrophils Relative %: 74 % (ref 43–77)

## 2010-08-23 LAB — GLUCOSE, CAPILLARY
Glucose-Capillary: 124 mg/dL — ABNORMAL HIGH (ref 70–99)
Glucose-Capillary: 144 mg/dL — ABNORMAL HIGH (ref 70–99)
Glucose-Capillary: 128 mg/dL — ABNORMAL HIGH (ref 70–99)
Glucose-Capillary: 132 mg/dL — ABNORMAL HIGH (ref 70–99)
Glucose-Capillary: 134 mg/dL — ABNORMAL HIGH (ref 70–99)
Glucose-Capillary: 136 mg/dL — ABNORMAL HIGH (ref 70–99)
Glucose-Capillary: 139 mg/dL — ABNORMAL HIGH (ref 70–99)
Glucose-Capillary: 174 mg/dL — ABNORMAL HIGH (ref 70–99)
Glucose-Capillary: 91 mg/dL (ref 70–99)

## 2010-08-23 LAB — POCT I-STAT, CHEM 8
BUN: 10 mg/dL (ref 6–23)
BUN: 7 mg/dL (ref 6–23)
Calcium, Ion: 1.1 mmol/L — ABNORMAL LOW (ref 1.12–1.32)
Calcium, Ion: 1.1 mmol/L — ABNORMAL LOW (ref 1.12–1.32)
Chloride: 101 mEq/L (ref 96–112)
Chloride: 107 mEq/L (ref 96–112)
Creatinine, Ser: 0.6 mg/dL (ref 0.4–1.5)
Creatinine, Ser: 0.9 mg/dL (ref 0.4–1.5)
Glucose, Bld: 100 mg/dL — ABNORMAL HIGH (ref 70–99)
Glucose, Bld: 142 mg/dL — ABNORMAL HIGH (ref 70–99)
HCT: 32 % — ABNORMAL LOW (ref 39.0–52.0)
HCT: 38 % — ABNORMAL LOW (ref 39.0–52.0)
Hemoglobin: 10.9 g/dL — ABNORMAL LOW (ref 13.0–17.0)
Hemoglobin: 12.9 g/dL — ABNORMAL LOW (ref 13.0–17.0)
Potassium: 3.5 mEq/L (ref 3.5–5.1)
Potassium: 4.1 mEq/L (ref 3.5–5.1)
Sodium: 139 mEq/L (ref 135–145)
Sodium: 142 mEq/L (ref 135–145)
TCO2: 24 mmol/L (ref 0–100)
TCO2: 27 mmol/L (ref 0–100)

## 2010-08-23 LAB — CARDIAC PANEL(CRET KIN+CKTOT+MB+TROPI)
CK, MB: 2.1 ng/mL (ref 0.3–4.0)
CK, MB: 2.8 ng/mL (ref 0.3–4.0)
CK, MB: 3 ng/mL (ref 0.3–4.0)
CK, MB: 4.6 ng/mL — ABNORMAL HIGH (ref 0.3–4.0)
Relative Index: 1.9 (ref 0.0–2.5)
Relative Index: 2.3 (ref 0.0–2.5)
Relative Index: 2.4 (ref 0.0–2.5)
Relative Index: 2.5 (ref 0.0–2.5)
Total CK: 109 U/L (ref 7–232)
Total CK: 121 U/L (ref 7–232)
Total CK: 126 U/L (ref 7–232)
Total CK: 186 U/L (ref 7–232)
Troponin I: 0.15 ng/mL — ABNORMAL HIGH (ref 0.00–0.06)
Troponin I: 0.17 ng/mL — ABNORMAL HIGH (ref 0.00–0.06)
Troponin I: 0.19 ng/mL — ABNORMAL HIGH (ref 0.00–0.06)
Troponin I: 0.33 ng/mL — ABNORMAL HIGH (ref 0.00–0.06)

## 2010-08-23 LAB — TYPE AND SCREEN
ABO/RH(D): B NEG
Antibody Screen: NEGATIVE

## 2010-08-23 LAB — MRSA PCR SCREENING: MRSA by PCR: NEGATIVE

## 2010-08-23 LAB — CK TOTAL AND CKMB (NOT AT ARMC)
CK, MB: 5.3 ng/mL — ABNORMAL HIGH (ref 0.3–4.0)
Relative Index: 2.4 (ref 0.0–2.5)
Total CK: 221 U/L (ref 7–232)

## 2010-08-23 LAB — POCT I-STAT GLUCOSE
Glucose, Bld: 132 mg/dL — ABNORMAL HIGH (ref 70–99)
Operator id: 3390

## 2010-08-23 LAB — HEPARIN LEVEL (UNFRACTIONATED)
Heparin Unfractionated: 0.24 IU/mL — ABNORMAL LOW (ref 0.30–0.70)
Heparin Unfractionated: 0.31 IU/mL (ref 0.30–0.70)
Heparin Unfractionated: 0.43 IU/mL (ref 0.30–0.70)

## 2010-08-23 LAB — ABO/RH: ABO/RH(D): B NEG

## 2010-08-23 LAB — D-DIMER, QUANTITATIVE (NOT AT ARMC): D-Dimer, Quant: 0.54 ug/mL-FEU — ABNORMAL HIGH (ref 0.00–0.48)

## 2010-08-23 LAB — MAGNESIUM
Magnesium: 2.1 mg/dL (ref 1.5–2.5)
Magnesium: 2.3 mg/dL (ref 1.5–2.5)

## 2010-08-23 LAB — PROTIME-INR
INR: 0.96 (ref 0.00–1.49)
INR: 1.17 (ref 0.00–1.49)
Prothrombin Time: 12.7 seconds (ref 11.6–15.2)
Prothrombin Time: 14.8 seconds (ref 11.6–15.2)

## 2010-08-23 LAB — PLATELET COUNT: Platelets: 91 10*3/uL — ABNORMAL LOW (ref 150–400)

## 2010-08-23 LAB — TSH: TSH: 52.128 u[IU]/mL — ABNORMAL HIGH (ref 0.350–4.500)

## 2010-08-23 NOTE — Assessment & Plan Note (Signed)
Summary: FOLLOW UP - 6 MONTHS/mt   Visit Type:  Follow-up Primary Provider:  Belva Agee, NP  CC:  CAD.  History of Present Illness: The patient presents for followup of his known coronary disease. Since I last saw him he has had no new anginal chest pain. He does describe some occasional sharp pains worse with certain movements of his arms. These have been occurring since his sternotomy. He denies any new neck or arm discomfort. He has had no new shortness of breath, PND or orthopnea. He has no palpitations presyncope or syncope. Of note he does have continued lower extremity swelling left greater than right leg though he says he tried to reduce his salt and keep his legs elevated at night.  Current Medications (verified): 1)  Klor-Con M20 20 Meq Cr-Tabs (Potassium Chloride Crys Cr) .Marland Kitchen.. 1 By Mouth Daily 2)  Levothroid 25 Mcg Tabs (Levothyroxine Sodium) .Marland Kitchen.. 1 By Mouth Daily 3)  Torsemide 20 Mg Tabs (Torsemide) .... One Daily 4)  Jobst Knee High Compression Sm  Misc (Elastic Bandages & Supports) .... 20 To 30 Mm/hg 5)  Simvastatin 40 Mg Tabs (Simvastatin) .... One Daily 6)  Aspirin 325 Mg  Tabs (Aspirin) .Marland Kitchen.. 1 By Mouth Daily  Allergies (verified): No Known Drug Allergies  Past History:  Past Medical History: Reviewed history from 11/10/2009 and no changes required. Hypertension Coronary artery disease Renal insufficiency COPD Obesity  Past Surgical History: Reviewed history from 11/10/2009 and no changes required. Hernia repair Tonsillectomy  CABG( left internal mammary artery   graft to left anterior descending coronary artery, with a saphenous vein   graft to the second obtuse marginal branch of the left circumflex   coronary artery, and a sequential saphenous vein graft to the posterior   descending and posterolateral branches of the right coronary artery.   Endoscopic vein harvesting from the right leg.)  Review of Systems       As stated in the HPI and negative for  all other systems.   Vital Signs:  Patient profile:   65 year old male Height:      69 inches Weight:      259 pounds BMI:     38.39 Pulse rate:   79 / minute Resp:     18 per minute BP sitting:   130 / 88  (right arm)  Vitals Entered By: Marrion Coy, CNA (August 17, 2010 1:26 PM)  Physical Exam  General:  Well developed, well nourished, in no acute distress. Head:  normocephalic and atraumatic Eyes:  PERRLA/EOM intact; conjunctiva and lids normal. Neck:  Neck supple, no JVD. No masses, thyromegaly or abnormal cervical nodes. Chest Wall:  Well-healed sternotomy scar Lungs:  Clear bilaterally to auscultation and percussion. Abdomen:  Bowel sounds positive; abdomen soft and non-tender without masses, organomegaly, or hernias noted. No hepatosplenomegaly, obese Msk:  Back normal, normal gait. Muscle strength and tone normal, There is a firm 3 x 7 cm swelling under the left elbow that is nonfluctuant and nontender Extremities:  Moderate bilateral lower extremity edema to the knees Neurologic:  Alert and oriented x 3. Skin:  Intact without lesions or rashes. Cervical Nodes:  no significant adenopathy Inguinal Nodes:  no significant adenopathy Psych:  Normal affect.   Detailed Cardiovascular Exam  Neck    Carotids: Carotids full and equal bilaterally without bruits.      Neck Veins: Normal, no JVD.    Heart    Inspection: no deformities or lifts noted.  Palpation: normal PMI with no thrills palpable.      Auscultation: regular rate and rhythm, S1, S2 without murmurs, rubs, gallops, or clicks.    Vascular    Abdominal Aorta: no palpable masses, pulsations, or audible bruits.      Femoral Pulses: normal femoral pulses bilaterally.      Pedal Pulses: normal pedal pulses bilaterally.      Radial Pulses: normal radial pulses bilaterally.     EKG  Procedure date:  08/17/2010  Findings:      Sinus rhythm, rate 80, axis within normal limits, intervals within normal  limits, no acute ST-T wave changes  Impression & Recommendations:  Problem # 1:  EDEMA (ICD-782.3) I will increase his torsemide to 2 pills daily for the next 3 days along with an extra potassium pill on these days. He will come back in about 2 weeks for a basic metabolic profile. We discussed keeping his feet elevated and reducing salt. We also discussed weight loss.  Problem # 2:  DYSLIPIDEMIA (ICD-272.4) He will come back in 2 weeks for a fasting lipid profile with a goal LDL less than 100 and HDL greater than 40. His updated medication list for this problem includes:    Simvastatin 40 Mg Tabs (Simvastatin) ..... One daily  Problem # 3:  CORONARY ATHEROSCLEROSIS NATIVE CORONARY ARTERY (ICD-414.01) He is having some nonanginal incisional chest discomfort. We will continue with the risk reduction Orders: EKG w/ Interpretation (93000)  Problem # 4:  HYPERTENSION (ICD-401.9) He will continue meds as listed.  Patient Instructions: 1)  Your physician recommends that you schedule a follow-up appointment in: 6 months with Dr Antoine Poche 2)  Your physician recommends that you return for lab work in:  2 weeks for a BMP and fasting lipid profile 401.1  272.0  v58.69 3)  Your physician has recommended you make the following change in your medication: Increase Torsemide to two tablets for 3 days then back to your normal dose.  Take one extra potassium daily for the next three days

## 2010-09-01 ENCOUNTER — Encounter: Payer: Self-pay | Admitting: Cardiology

## 2010-09-01 NOTE — Progress Notes (Signed)
Summary: pt rtn call   Phone Note Call from Patient   Caller: Patient [phone] Reason for Call: Talk to Nurse Summary of Call: pt calling re lab results Initial call taken by: Glynda Jaeger,  August 22, 2010 11:32 AM  Follow-up for Phone Call        na at home number   pt due for lab as outlined in last office note by Dr Dorchester Lions around 3/28  Avie Arenas, RN  I spoke with both pt and wife who says [facility] called them and said they have his blood work results and that he "needs to see a doctor".    I requested the pt call them and clarify what it is they need.   Follow-up by: Charolotte Capuchin, RN,  August 22, 2010 5:12 PM

## 2010-10-12 ENCOUNTER — Other Ambulatory Visit: Payer: Self-pay | Admitting: Cardiology

## 2010-10-13 ENCOUNTER — Telehealth: Payer: Self-pay | Admitting: Cardiology

## 2010-10-13 MED ORDER — POTASSIUM CHLORIDE CRYS ER 20 MEQ PO TBCR: 20.0000 meq | EXTENDED_RELEASE_TABLET | Freq: Every day | ORAL | Status: DC

## 2010-10-13 NOTE — Telephone Encounter (Signed)
Pt needs his potassium to be call in to [facility] # [phone]

## 2010-10-18 NOTE — Assessment & Plan Note (Signed)
OFFICE VISIT   Matthew May, Matthew May  DOB:  1945-10-28                                        Oct 26, 2009  CHART #:  [id number]   HISTORY:  The patient returned today for followup status post coronary  bypass graft surgery x4 on October 01, 2009.  He said that he has  continued to abstain from smoking and is feeling well overall.  He is  walking daily without chest pain or shortness of breath.   PHYSICAL EXAMINATION:  Blood pressure 125/80, pulse is 68 and regular,  and respiratory rate is 18, unlabored.  Oxygen saturation on room air is  95%.  He looks well.  Cardiac exam shows regular rate and rhythm with  normal heart sounds.  His lung exam is clear.  The chest incision is  healing well and the sternum is stable.  He does have mild bilateral  lower extremity edema, greater in the right lower leg than the left  lower leg, which is his vein harvest side.  The vein harvest incisions  are healing well.   MEDICATIONS:  1. Synthroid 25 mcg daily.  2. Lopressor 12.5 mg b.i.d.  3. Crestor 40 mg nightly.  4. Aspirin 325 mg daily.  5. Oxycodone IR 5 mg p.r.n.   DIAGNOSTIC TESTS:  Chest x-ray today shows a tiny left pleural effusion  and bibasilar atelectasis.   IMPRESSION:  Overall, the patient is making a good recovery following  his surgery.  He does have bilateral lower extremity edema as well as a  small left effusion on chest x-ray.  I have decided to start him on  Lasix 40 mg daily and potassium chloride 20 mEq daily.  I told him that  if his edema completely resolved, he could discontinue these.  I  encouraged him to continue abstaining from smoking.  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  He will continue to follow up with Dr. Clifton James for his  cardiology care and will contact me if he develops any problems with his  incisions.   Evelene Croon, M.D.  Electronically Signed   BB/MEDQ  D:  10/26/2009  T:  10/26/2009  Job:  [id number]   cc:   Verne Carrow, MD

## 2010-11-07 ENCOUNTER — Emergency Department (HOSPITAL_COMMUNITY): Payer: Self-pay

## 2010-11-07 ENCOUNTER — Emergency Department (HOSPITAL_COMMUNITY)
Admission: EM | Admit: 2010-11-07 | Discharge: 2010-11-07 | Disposition: A | Discharge status: Home / Self Care | Payer: Self-pay | Attending: Emergency Medicine | Admitting: Emergency Medicine

## 2010-11-07 DIAGNOSIS — Z951 Presence of aortocoronary bypass graft: Secondary | ICD-10-CM | POA: Insufficient documentation

## 2010-11-07 DIAGNOSIS — Z79899 Other long term (current) drug therapy: Secondary | ICD-10-CM | POA: Insufficient documentation

## 2010-11-07 DIAGNOSIS — I252 Old myocardial infarction: Secondary | ICD-10-CM | POA: Insufficient documentation

## 2010-11-07 DIAGNOSIS — Z7982 Long term (current) use of aspirin: Secondary | ICD-10-CM | POA: Insufficient documentation

## 2010-11-07 DIAGNOSIS — I10 Essential (primary) hypertension: Secondary | ICD-10-CM | POA: Insufficient documentation

## 2010-11-07 DIAGNOSIS — M25569 Pain in unspecified knee: Secondary | ICD-10-CM | POA: Insufficient documentation

## 2010-11-07 DIAGNOSIS — E7800 Pure hypercholesterolemia, unspecified: Secondary | ICD-10-CM | POA: Insufficient documentation

## 2010-12-14 ENCOUNTER — Other Ambulatory Visit: Payer: Self-pay | Admitting: Cardiology

## 2011-01-03 IMAGING — CR DG ELBOW COMPLETE 3+V*L*
4 series · 4 of 4 positions shown · non-contrast
Comparison: 02/05/2010; 01/24/2010

CLINICAL DATA: Elbow swelling.  Pain.  No known injury.

LEFT ELBOW - COMPLETE 3+ VIEW

[x elbow joint ap left]
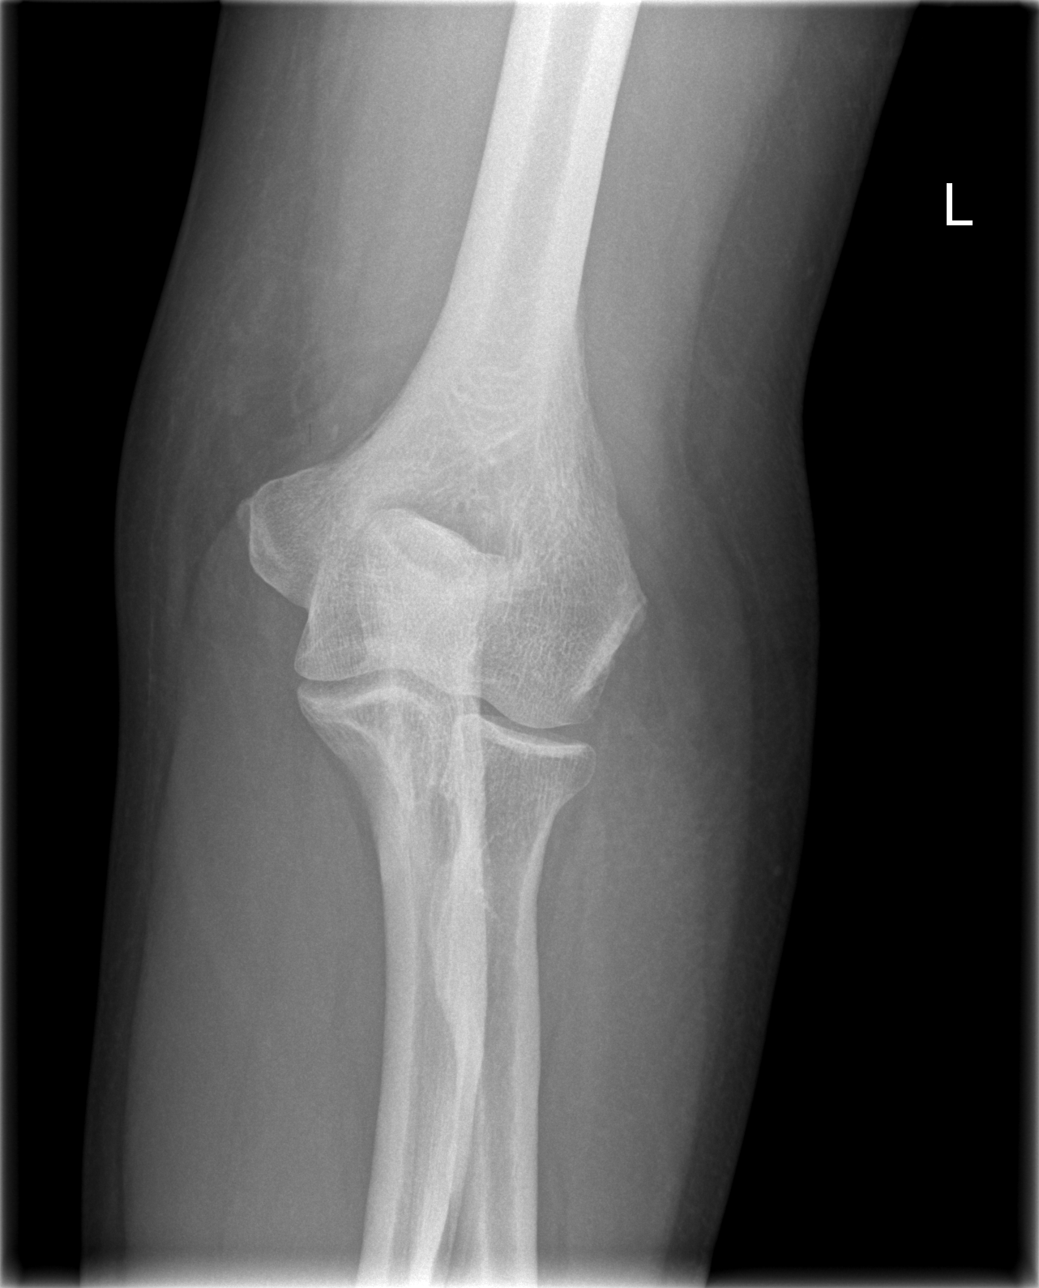

[x elbow joint obl. left (1 of 2)]
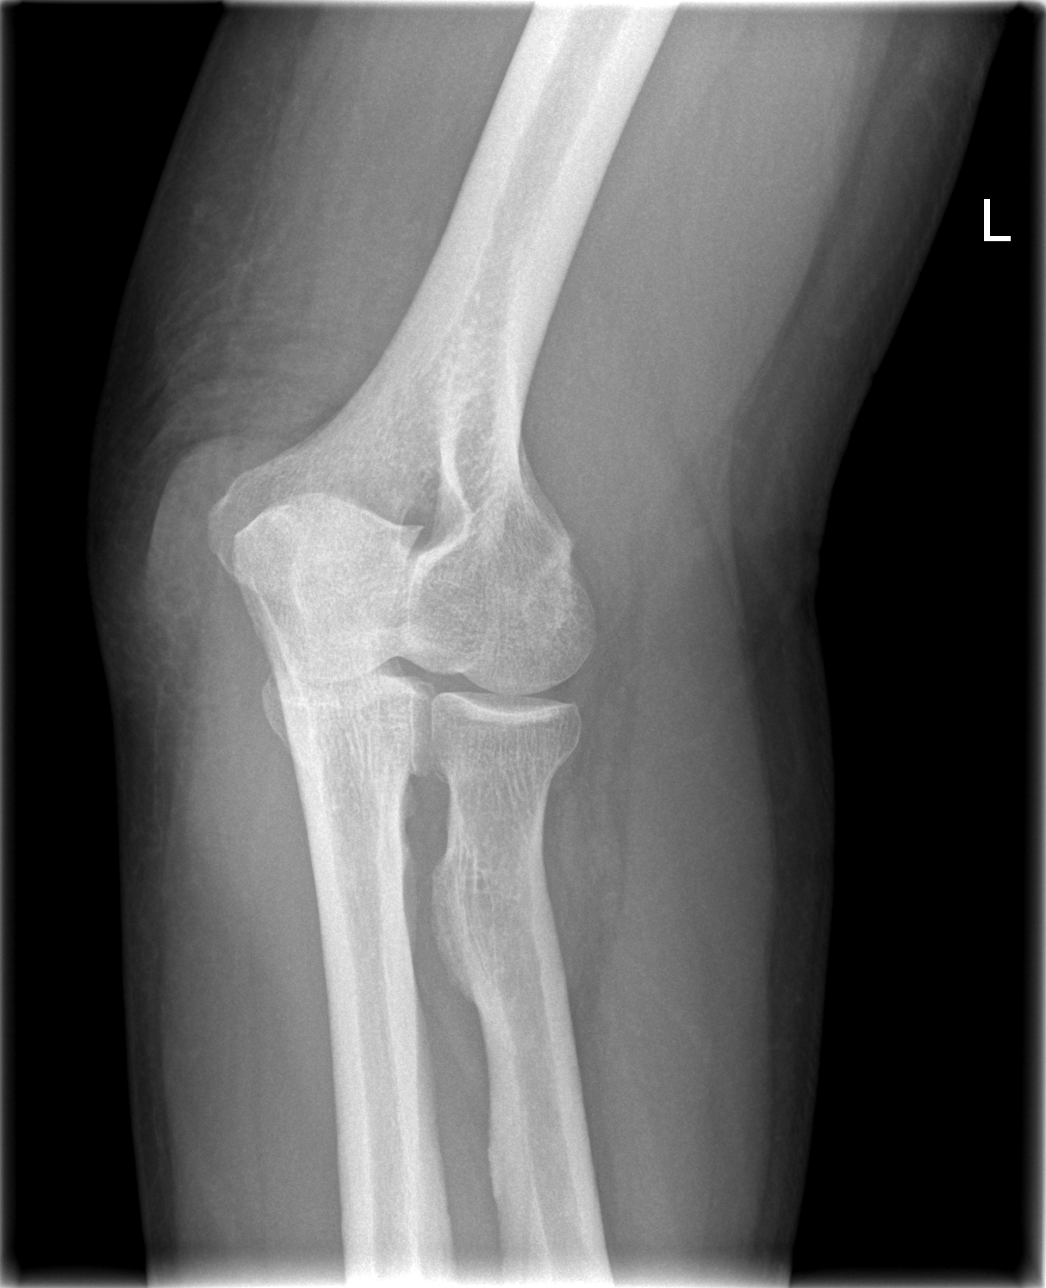

[x elbow joint obl. left (2 of 2)]
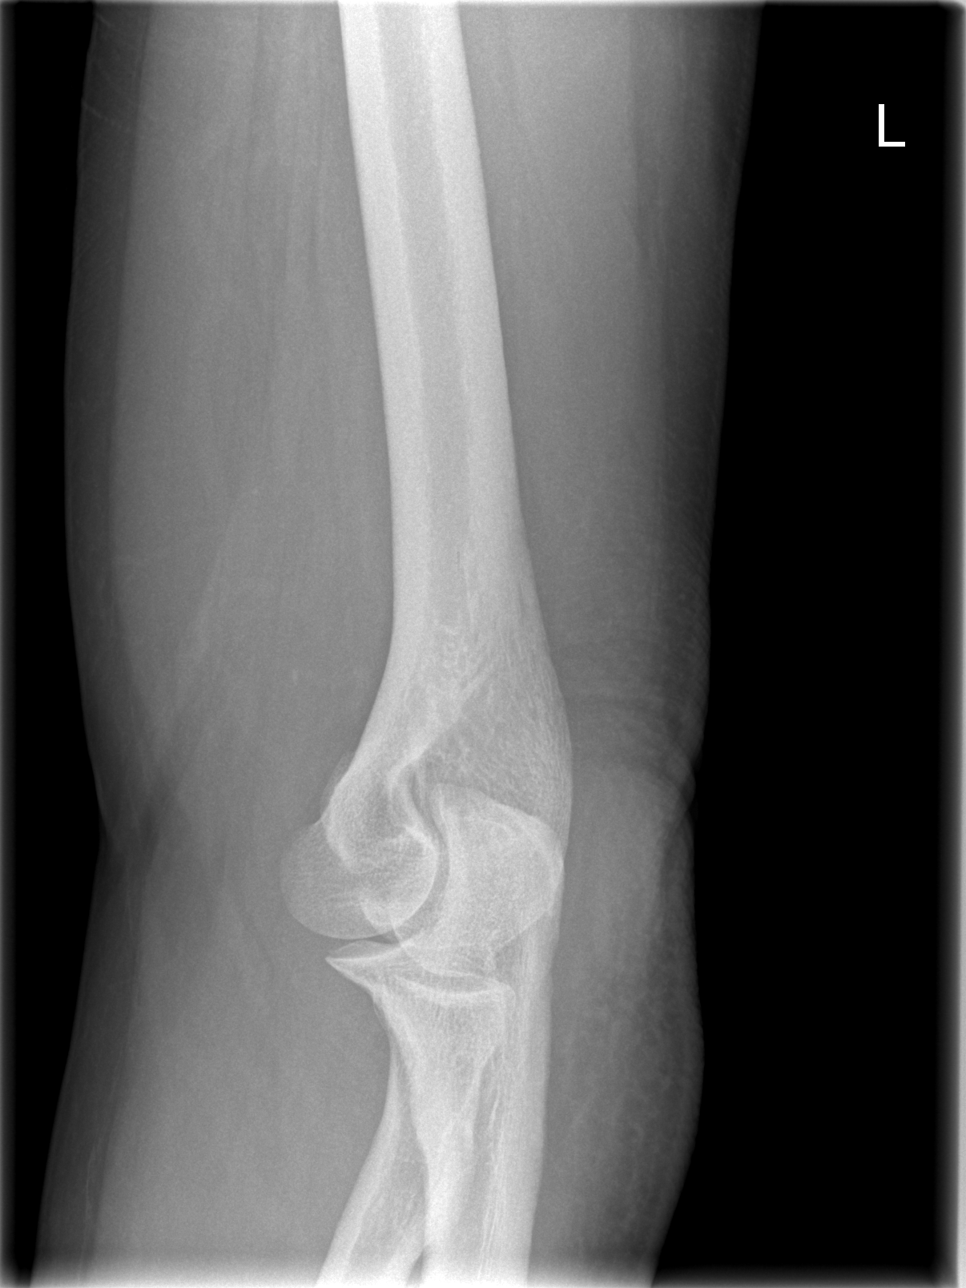

[x elbow joint lat left]
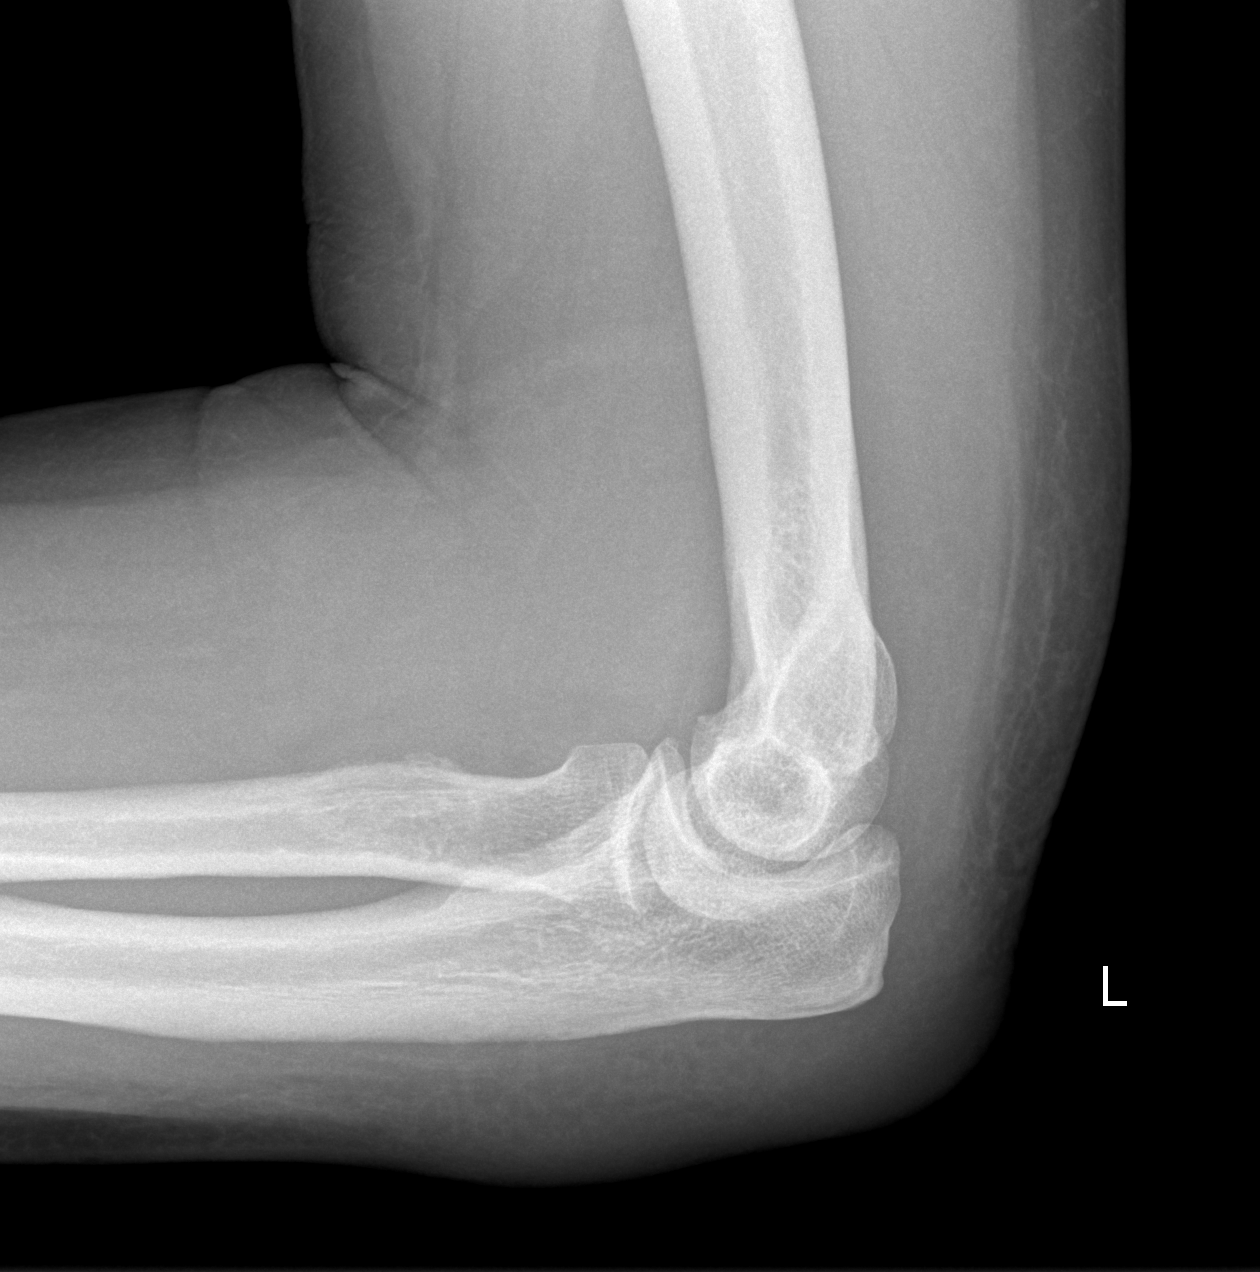

[4 of 4 positions shown; findings below may reference images not displayed]

FINDINGS: There continues to be a significant posterior soft tissue
swelling.  No evidence for acute fracture, subluxation, or joint
effusion.  No radiopaque foreign body identified.
IMPRESSION: Soft tissue swelling.

## 2011-01-06 ENCOUNTER — Encounter: Payer: Self-pay | Admitting: Cardiology

## 2011-02-15 ENCOUNTER — Other Ambulatory Visit: Payer: Self-pay | Admitting: Cardiology

## 2011-03-03 LAB — DIFFERENTIAL
Basophils Absolute: 0
Basophils Relative: 0
Eosinophils Absolute: 0.5
Eosinophils Relative: 6 — ABNORMAL HIGH
Lymphocytes Relative: 25
Lymphs Abs: 2.2
Monocytes Absolute: 0.4
Monocytes Relative: 4
Neutro Abs: 5.8
Neutrophils Relative %: 65

## 2011-03-03 LAB — BASIC METABOLIC PANEL
BUN: 14
CO2: 26
Calcium: 9.2
Chloride: 108
Creatinine, Ser: 0.94
GFR calc Af Amer: >60
GFR calc non Af Amer: >60
Glucose, Bld: 118 — ABNORMAL HIGH
Potassium: 4
Sodium: 139

## 2011-03-03 LAB — URINALYSIS, ROUTINE W REFLEX MICROSCOPIC
Glucose, UA: NEGATIVE
Ketones, ur: NEGATIVE
Leukocytes, UA: NEGATIVE
Nitrite: NEGATIVE
Protein, ur: NEGATIVE
Specific Gravity, Urine: >1.03 — ABNORMAL HIGH
Urobilinogen, UA: 1
pH: 6

## 2011-03-03 LAB — URINE MICROSCOPIC-ADD ON

## 2011-03-03 LAB — CBC
HCT: 42.5
Hemoglobin: 14.4
MCHC: 33.9
MCV: 90.6
Platelets: 166
RBC: 4.7
RDW: 14.1
WBC: 9

## 2011-03-03 LAB — URINE CULTURE: Colony Count: >=100000

## 2011-03-15 ENCOUNTER — Other Ambulatory Visit: Payer: Self-pay | Admitting: Cardiology

## 2011-03-15 NOTE — Telephone Encounter (Signed)
rx request. Can not find rx for lopressor 25 in pt chart other than sept last. Is said to be a [place] pt. Has been seen since sept 2011 but not mention of lopressor in recent visits discontinued or taking.

## 2011-03-16 NOTE — Telephone Encounter (Signed)
Tried to call patient on number listed. No answer after several rings. Will try again.

## 2011-03-29 ENCOUNTER — Ambulatory Visit (INDEPENDENT_AMBULATORY_CARE_PROVIDER_SITE_OTHER): Payer: Self-pay | Admitting: Cardiology

## 2011-03-29 ENCOUNTER — Encounter: Payer: Self-pay | Admitting: Cardiology

## 2011-03-29 DIAGNOSIS — E785 Hyperlipidemia, unspecified: Secondary | ICD-10-CM

## 2011-03-29 DIAGNOSIS — I10 Essential (primary) hypertension: Secondary | ICD-10-CM

## 2011-03-29 DIAGNOSIS — I2510 Atherosclerotic heart disease of native coronary artery without angina pectoris: Secondary | ICD-10-CM

## 2011-03-29 DIAGNOSIS — E0590 Thyrotoxicosis, unspecified without thyrotoxic crisis or storm: Secondary | ICD-10-CM

## 2011-03-29 MED ORDER — LEVOTHYROXINE SODIUM 25 MCG PO TABS: 25.0000 ug | ORAL_TABLET | Freq: Every day | ORAL | Status: DC

## 2011-03-29 MED ORDER — SIMVASTATIN 40 MG PO TABS: 40.0000 mg | ORAL_TABLET | Freq: Every day | ORAL | Status: DC

## 2011-03-29 NOTE — Assessment & Plan Note (Signed)
I will check a lipid profile fasting.

## 2011-03-29 NOTE — Assessment & Plan Note (Signed)
The patient has no new sypmtoms.  No further cardiovascular testing is indicated.  We will continue with aggressive risk reduction and meds as listed.  He is s/p CABG in 2011.

## 2011-03-29 NOTE — Assessment & Plan Note (Signed)
The blood pressure is at target. No change in medications is indicated. We will continue with therapeutic lifestyle changes (TLC).  

## 2011-03-29 NOTE — Patient Instructions (Addendum)
The current medical regimen is effective;  continue present plan and medications.  Please have a fasting lipid, liver profile and a TSH.  Follow up in 6 months with Dr Antoine Poche.  You will receive a letter in the mail 2 months before you are due.  Please call us when you receive this letter to schedule your follow up appointment.

## 2011-03-29 NOTE — Progress Notes (Signed)
HPI The patient returns for followup of his known coronary disease. He continues to have some discomfort with his sternal incision but none of the angina that he had previously. He denies any chest pressure, neck or arm discomfort. He has no shortness of breath, PND or orthopnea. He has had no weight gain. He does have mild lower extremity swelling. This is chronic. 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 He says at night when he does stretch his legs he will start to get an ache in his posterior thigh that goes away when he gets up and ambulates.  No Known Allergies  Current Outpatient Prescriptions  Medication Sig Dispense Refill  . aspirin 325 MG tablet Take 325 mg by mouth daily.        . NON FORMULARY jobst knee high compression sm misc       . potassium chloride SA (K-DUR,KLOR-CON) 20 MEQ tablet Take 1 tablet (20 mEq total) by mouth daily.  30 tablet  10  . torsemide (DEMADEX) 20 MG tablet TAKE ONE TABLET BY MOUTH EVERY DAY  30 tablet  6  . levothyroxine (SYNTHROID, LEVOTHROID) 25 MCG tablet Take 25 mcg by mouth daily.        . simvastatin (ZOCOR) 40 MG tablet Take 40 mg by mouth daily.          Past Medical History  Diagnosis Date  . HTN (hypertension)   . CAD (coronary artery disease)   . Renal insufficiency   . COPD (chronic obstructive pulmonary disease)   . Obesity     Past Surgical History  Procedure Date  . Hernia repair   . Tonsillectomy   . Coronary artery bypass graft     LIMA to LAD coronary artery, SVG to OM2 branch of lect circumflex coronary artery, and a sequential SVG to psot descening to posterolateral branches to RCA  . Endoscopic vein harvesting     right leg     ROS:  As stated in the HPI and negative for all other systems.  PHYSICAL EXAM BP 134/80  Pulse 77  Resp 18  Ht 5\' 9"  (1.753 m)  Wt 260 lb (117.935 kg)  BMI 38.40 kg/m2 GENERAL:  Well appearing HEENT:  Pupils equal round and reactive, fundi not visualized, oral mucosa  unremarkable NECK:  No jugular venous distention, waveform within normal limits, carotid upstroke brisk and symmetric, no bruits, no thyromegaly LYMPHATICS:  No cervical, inguinal adenopathy LUNGS:  Clear to auscultation bilaterally BACK:  No CVA tenderness CHEST:  Well healed sternotomy scar. HEART:  PMI not displaced or sustained,S1 and S2 within normal limits, no S3, no S4, no clicks, no rubs, soft right upper sternal border murmur early peaking ABD:  Flat, positive bowel sounds normal in frequency in pitch, no bruits, no rebound, no guarding, no midline pulsatile mass, no hepatomegaly, no splenomegaly EXT:  2 plus pulses throughout, moderate edema, no cyanosis no clubbing SKIN:  No rashes no nodules NEURO:  Cranial nerves II through XII grossly intact, motor grossly intact throughout PSYCH:  Cognitively intact, oriented to person place and time  EKG:  Sinus rhythm, rate 73, axis within normal limits, and intervals within normal limits, old anteroseptal MI, premature ventricular contractions, nonspecific lateral T wave flattening  ASSESSMENT AND PLAN

## 2011-05-15 ENCOUNTER — Encounter: Payer: Self-pay | Admitting: Cardiology

## 2011-06-22 ENCOUNTER — Other Ambulatory Visit: Payer: Self-pay | Admitting: Cardiology

## 2011-06-22 MED ORDER — POTASSIUM CHLORIDE CRYS ER 20 MEQ PO TBCR: 20.0000 meq | EXTENDED_RELEASE_TABLET | Freq: Every day | ORAL | Status: DC

## 2011-06-22 MED ORDER — TORSEMIDE 20 MG PO TABS: 20.0000 mg | ORAL_TABLET | Freq: Every day | ORAL | Status: DC

## 2011-06-22 NOTE — Telephone Encounter (Signed)
New Problem   Patient wants to get refill on meds but does not know what all of his medications are.  Please verify refill info with patient.  He can be reached at hm#

## 2011-07-12 ENCOUNTER — Encounter: Payer: Self-pay | Admitting: Cardiology

## 2011-07-12 ENCOUNTER — Ambulatory Visit (INDEPENDENT_AMBULATORY_CARE_PROVIDER_SITE_OTHER): Payer: Self-pay | Admitting: Cardiology

## 2011-07-12 DIAGNOSIS — I2510 Atherosclerotic heart disease of native coronary artery without angina pectoris: Secondary | ICD-10-CM

## 2011-07-12 DIAGNOSIS — I10 Essential (primary) hypertension: Secondary | ICD-10-CM

## 2011-07-12 DIAGNOSIS — E785 Hyperlipidemia, unspecified: Secondary | ICD-10-CM

## 2011-07-12 DIAGNOSIS — E0590 Thyrotoxicosis, unspecified without thyrotoxic crisis or storm: Secondary | ICD-10-CM

## 2011-07-12 DIAGNOSIS — E039 Hypothyroidism, unspecified: Secondary | ICD-10-CM

## 2011-07-12 MED ORDER — SIMVASTATIN 40 MG PO TABS: 40.0000 mg | ORAL_TABLET | Freq: Every day | ORAL | Status: DC

## 2011-07-12 MED ORDER — LEVOTHYROXINE SODIUM 25 MCG PO TABS: 25.0000 ug | ORAL_TABLET | Freq: Every day | ORAL | Status: DC

## 2011-07-12 NOTE — Progress Notes (Signed)
   HPI The patient returns for followup of his known coronary disease.  Since I last saw him he has had no new cardiovascular complaints. He has been out of his Synthroid and simvastatin. His last cholesterol was 263 with an LDL of 208. His last TSH was 75.625. He denies any chest pressure, neck or arm discomfort. He denies any palpitations, presyncope or syncope. He has had no shortness of breath, PND or orthopnea. He says he is active when the weather permits.  No Known Allergies  Current Outpatient Prescriptions  Medication Sig Dispense Refill  . aspirin 325 MG tablet Take 325 mg by mouth daily.        Marland Kitchen torsemide (DEMADEX) 20 MG tablet Take 1 tablet (20 mg total) by mouth daily.  30 tablet  6  . levothyroxine (SYNTHROID, LEVOTHROID) 25 MCG tablet Take 1 tablet (25 mcg total) by mouth daily.  30 tablet  6  . NON FORMULARY jobst knee high compression sm misc       . potassium chloride SA (K-DUR,KLOR-CON) 20 MEQ tablet Take 1 tablet (20 mEq total) by mouth daily.  30 tablet  10  . simvastatin (ZOCOR) 40 MG tablet Take 1 tablet (40 mg total) by mouth daily.  30 tablet  6    Past Medical History  Diagnosis Date  . HTN (hypertension)   . CAD (coronary artery disease)   . Renal insufficiency   . COPD (chronic obstructive pulmonary disease)   . Obesity     Past Surgical History  Procedure Date  . Hernia repair   . Tonsillectomy   . Coronary artery bypass graft     LIMA to LAD coronary artery, SVG to OM2 branch of lect circumflex coronary artery, and a sequential SVG to psot descening to posterolateral branches to RCA  . Endoscopic vein harvesting     right leg     ROS:  As stated in the HPI and negative for all other systems.  PHYSICAL EXAM BP 110/76  Pulse 76  Ht 5\' 9"  (1.753 m)  Wt 264 lb (119.75 kg)  BMI 38.99 kg/m2 GENERAL:  Well appearing HEENT:  Pupils equal round and reactive, fundi not visualized, oral mucosa unremarkable NECK:  No jugular venous distention, waveform  within normal limits, carotid upstroke brisk and symmetric, no bruits, no thyromegaly LYMPHATICS:  No cervical, inguinal adenopathy LUNGS:  Clear to auscultation bilaterally BACK:  No CVA tenderness CHEST:  Well healed sternotomy scar. HEART:  PMI not displaced or sustained,S1 and S2 within normal limits, no S3, no S4, no clicks, no rubs, soft right upper sternal border murmur early peaking ABD:  Flat, positive bowel sounds normal in frequency in pitch, no bruits, no rebound, no guarding, no midline pulsatile mass, no hepatomegaly, no splenomegaly, obese EXT:  2 plus pulses throughout, moderate edema, no cyanosis no clubbing SKIN:  No rashes no nodules NEURO:  Cranial nerves II through XII grossly intact, motor grossly intact throughout PSYCH:  Cognitively intact, oriented to person place and time  EKG:  Sinus rhythm, rate 76, axis within normal limits, and intervals within normal limits, old anteroseptal MI, premature atrial contractions, nonspecific lateral T wave flattening  07/12/2011  ASSESSMENT AND PLAN

## 2011-07-12 NOTE — Assessment & Plan Note (Signed)
The blood pressure is at target. No change in medications is indicated. We will continue with therapeutic lifestyle changes (TLC).  

## 2011-07-12 NOTE — Assessment & Plan Note (Signed)
I have renewed this and directed him to followup with his primary providers.

## 2011-07-12 NOTE — Patient Instructions (Addendum)
Please continue all medications as listed  Follow up in 1 year with Dr Antoine Poche.  You will receive a letter in the mail 2 months before you are due.  Please call us when you receive this letter to schedule your follow up appointment.

## 2011-07-12 NOTE — Assessment & Plan Note (Signed)
His lipids are not well controlled he's been off his meds. I will renew this. I will send him back to her [facility] to follow up this and the markedly abnormal TSH.

## 2011-07-12 NOTE — Assessment & Plan Note (Signed)
He is having no new symptoms. No further cardiovascular testing is suggested that he does need continued risk reduction.

## 2011-07-13 ENCOUNTER — Telehealth: Payer: Self-pay | Admitting: Cardiology

## 2011-07-13 MED ORDER — NITROGLYCERIN 0.4 MG SL SUBL: 0.4000 mg | SUBLINGUAL_TABLET | SUBLINGUAL | Status: DC | PRN

## 2011-07-13 NOTE — Telephone Encounter (Signed)
New problem Pt saw Dr Antoine Poche yesterday and Nitroglycerin  Refill was to be called to [facility] in [place]. They have not received it Please resend

## 2012-02-13 ENCOUNTER — Other Ambulatory Visit: Payer: Self-pay | Admitting: Cardiology

## 2012-07-03 ENCOUNTER — Emergency Department (HOSPITAL_COMMUNITY)
Admission: EM | Admit: 2012-07-03 | Discharge: 2012-07-03 | Disposition: A | Discharge status: Home / Self Care | Payer: Medicare Other | Attending: Emergency Medicine | Admitting: Emergency Medicine

## 2012-07-03 ENCOUNTER — Encounter (HOSPITAL_COMMUNITY): Payer: Self-pay | Admitting: Emergency Medicine

## 2012-07-03 ENCOUNTER — Emergency Department (HOSPITAL_COMMUNITY): Payer: Medicare Other

## 2012-07-03 DIAGNOSIS — R7309 Other abnormal glucose: Secondary | ICD-10-CM | POA: Insufficient documentation

## 2012-07-03 DIAGNOSIS — Z79899 Other long term (current) drug therapy: Secondary | ICD-10-CM | POA: Insufficient documentation

## 2012-07-03 DIAGNOSIS — I10 Essential (primary) hypertension: Secondary | ICD-10-CM | POA: Insufficient documentation

## 2012-07-03 DIAGNOSIS — J4489 Other specified chronic obstructive pulmonary disease: Secondary | ICD-10-CM | POA: Insufficient documentation

## 2012-07-03 DIAGNOSIS — IMO0002 Reserved for concepts with insufficient information to code with codable children: Secondary | ICD-10-CM | POA: Insufficient documentation

## 2012-07-03 DIAGNOSIS — Y939 Activity, unspecified: Secondary | ICD-10-CM | POA: Insufficient documentation

## 2012-07-03 DIAGNOSIS — J449 Chronic obstructive pulmonary disease, unspecified: Secondary | ICD-10-CM | POA: Insufficient documentation

## 2012-07-03 DIAGNOSIS — Z87448 Personal history of other diseases of urinary system: Secondary | ICD-10-CM | POA: Insufficient documentation

## 2012-07-03 DIAGNOSIS — I2510 Atherosclerotic heart disease of native coronary artery without angina pectoris: Secondary | ICD-10-CM | POA: Insufficient documentation

## 2012-07-03 DIAGNOSIS — Z87891 Personal history of nicotine dependence: Secondary | ICD-10-CM | POA: Insufficient documentation

## 2012-07-03 DIAGNOSIS — Y92009 Unspecified place in unspecified non-institutional (private) residence as the place of occurrence of the external cause: Secondary | ICD-10-CM | POA: Insufficient documentation

## 2012-07-03 DIAGNOSIS — R739 Hyperglycemia, unspecified: Secondary | ICD-10-CM

## 2012-07-03 DIAGNOSIS — L0390 Cellulitis, unspecified: Secondary | ICD-10-CM

## 2012-07-03 DIAGNOSIS — Z7982 Long term (current) use of aspirin: Secondary | ICD-10-CM | POA: Insufficient documentation

## 2012-07-03 DIAGNOSIS — Z951 Presence of aortocoronary bypass graft: Secondary | ICD-10-CM | POA: Insufficient documentation

## 2012-07-03 DIAGNOSIS — L0291 Cutaneous abscess, unspecified: Secondary | ICD-10-CM | POA: Insufficient documentation

## 2012-07-03 DIAGNOSIS — E669 Obesity, unspecified: Secondary | ICD-10-CM | POA: Insufficient documentation

## 2012-07-03 DIAGNOSIS — L539 Erythematous condition, unspecified: Secondary | ICD-10-CM | POA: Insufficient documentation

## 2012-07-03 DIAGNOSIS — Z8614 Personal history of Methicillin resistant Staphylococcus aureus infection: Secondary | ICD-10-CM | POA: Insufficient documentation

## 2012-07-03 LAB — CBC WITH DIFFERENTIAL/PLATELET
Basophils Absolute: 0 10*3/uL (ref 0.0–0.1)
Basophils Relative: 0 % (ref 0–1)
Eosinophils Absolute: 0.3 10*3/uL (ref 0.0–0.7)
Eosinophils Relative: 4 % (ref 0–5)
HCT: 40.5 % (ref 39.0–52.0)
Hemoglobin: 13.8 g/dL (ref 13.0–17.0)
Lymphocytes Relative: 18 % (ref 12–46)
Lymphs Abs: 1.2 10*3/uL (ref 0.7–4.0)
MCH: 29.6 pg (ref 26.0–34.0)
MCHC: 34.1 g/dL (ref 30.0–36.0)
MCV: 86.9 fL (ref 78.0–100.0)
Monocytes Absolute: 0.3 10*3/uL (ref 0.1–1.0)
Monocytes Relative: 5 % (ref 3–12)
Neutro Abs: 4.8 10*3/uL (ref 1.7–7.7)
Neutrophils Relative %: 73 % (ref 43–77)
Platelets: 141 10*3/uL — ABNORMAL LOW (ref 150–400)
RBC: 4.66 MIL/uL (ref 4.22–5.81)
RDW: 12.7 % (ref 11.5–15.5)
WBC: 6.6 10*3/uL (ref 4.0–10.5)

## 2012-07-03 LAB — COMPREHENSIVE METABOLIC PANEL
ALT: 36 U/L (ref 0–53)
AST: 31 U/L (ref 0–37)
Albumin: 3.9 g/dL (ref 3.5–5.2)
Alkaline Phosphatase: 80 U/L (ref 39–117)
BUN: 13 mg/dL (ref 6–23)
CO2: 30 mEq/L (ref 19–32)
Calcium: 9.3 mg/dL (ref 8.4–10.5)
Chloride: 95 mEq/L — ABNORMAL LOW (ref 96–112)
Creatinine, Ser: 0.78 mg/dL (ref 0.50–1.35)
GFR calc Af Amer: >90 mL/min (ref >90)
GFR calc non Af Amer: >90 mL/min (ref >90)
Glucose, Bld: 362 mg/dL — ABNORMAL HIGH (ref 70–99)
Potassium: 4 mEq/L (ref 3.5–5.1)
Sodium: 133 mEq/L — ABNORMAL LOW (ref 135–145)
Total Bilirubin: 0.3 mg/dL (ref 0.3–1.2)
Total Protein: 7.5 g/dL (ref 6.0–8.3)

## 2012-07-03 MED ORDER — METFORMIN HCL 500 MG PO TABS: 500.0000 mg | ORAL_TABLET | Freq: Two times a day (BID) | ORAL | Status: DC

## 2012-07-03 MED ORDER — INSULIN ASPART 100 UNIT/ML ~~LOC~~ SOLN
10.0000 [IU] | Freq: Once | SUBCUTANEOUS | Status: AC
  Administered 2012-07-03: 10 [IU] via INTRAVENOUS
  Filled 2012-07-03: qty 1

## 2012-07-03 MED ORDER — VANCOMYCIN HCL IN DEXTROSE 1-5 GM/200ML-% IV SOLN
1000.0000 mg | Freq: Once | INTRAVENOUS | Status: AC
  Administered 2012-07-03: 1000 mg via INTRAVENOUS
  Filled 2012-07-03: qty 200

## 2012-07-03 MED ORDER — INSULIN REGULAR HUMAN 100 UNIT/ML IJ SOLN
10.0000 [IU] | Freq: Once | INTRAMUSCULAR | Status: DC
  Filled 2012-07-03: qty 0.1

## 2012-07-03 MED ORDER — CLINDAMYCIN HCL 300 MG PO CAPS: 300.0000 mg | ORAL_CAPSULE | Freq: Three times a day (TID) | ORAL | Status: DC

## 2012-07-03 NOTE — ED Notes (Signed)
Pt hit L anterior leg last week. States swelling/would redness still present. Redness/swelling noted with dime size black wound in middle

## 2012-07-03 NOTE — ED Provider Notes (Signed)
History  This chart was scribed for Matthew Racer, MD by Bennett Scrape, ED Scribe. This patient was seen in room APA19/[facility]19 and the patient's care was started at 11:20 AM.  CSN: [id number]  Arrival date & time 07/03/12  1100   First MD Initiated Contact with Patient 07/03/12 1120      Chief Complaint  Patient presents with  . Leg Pain    Patient is a 67 y.o. male presenting with leg pain. The history is provided by the patient. No language interpreter was used.  Leg Pain  The incident occurred more than 2 days ago. The incident occurred at home. The pain is present in the left leg. The pain is mild. The pain has been constant since onset. He reports no foreign bodies present. The symptoms are aggravated by bearing weight. He has tried nothing for the symptoms.    Matthew May is a 67 y.o. male who presents to the Emergency Department complaining of 4 days of sudden onset, gradually worsening, constant left lower leg pain that started 4 days ago after hitting his leg on the corner of the stove with associated erythema and edema that started 2 days ago. The pain is worse with ambulation, but pt states that he can ambulate without difficulty. He denies taking OTC medications at home to improve symptoms. He reports a similar episode on his right hand that was diagnosed as MRSA. He reports chronic right leg swelling and denies changes. He denies fevers, chills, nausea, emesis and abdominal pain as associated symptoms. He has a h/o HTN, CAD, COPD and is a former smoker but denies alcohol use.  Past Medical History  Diagnosis Date  . HTN (hypertension)   . CAD (coronary artery disease)   . Renal insufficiency   . COPD (chronic obstructive pulmonary disease)   . Obesity     Past Surgical History  Procedure Date  . Hernia repair   . Tonsillectomy   . Coronary artery bypass graft     2011, LIMA to LAD coronary artery, SVG to OM2 branch of lect circumflex coronary artery, and a  sequential SVG to psot descening to posterolateral branches to RCA  . Endoscopic vein harvesting     right leg     History reviewed. No pertinent family history.  History  Substance Use Topics  . Smoking status: Former Smoker    Quit date: 07/11/2009  . Smokeless tobacco: Not on file     Comment: smokes about 10 cig/day; used to smoke 2 ppd for many years   . Alcohol Use: No      Review of Systems  Constitutional: Negative for fever and chills.  Gastrointestinal: Negative for nausea, vomiting and abdominal pain.  Musculoskeletal: Positive for myalgias (left lower leg pain). Negative for back pain.  Skin: Positive for color change (lower left leg redness ).  All other systems reviewed and are negative.    Allergies  Review of patient's allergies indicates no known allergies.  Home Medications   Current Outpatient Rx  Name  Route  Sig  Dispense  Refill  . ASPIRIN 325 MG PO TABS   Oral   Take 325 mg by mouth daily.           Marland Kitchen LEVOTHYROXINE SODIUM 25 MCG PO TABS   Oral   Take 1 tablet (25 mcg total) by mouth daily.   30 tablet   11   . NITROGLYCERIN 0.4 MG SL SUBL   Sublingual   Place 1 tablet (  0.4 mg total) under the tongue every 5 (five) minutes as needed for chest pain.   25 tablet   6   . POTASSIUM CHLORIDE CRYS ER 20 MEQ PO TBCR   Oral   Take 1 tablet (20 mEq total) by mouth daily.   30 tablet   10   . SIMVASTATIN 40 MG PO TABS   Oral   Take 1 tablet (40 mg total) by mouth daily.   30 tablet   11   . TORSEMIDE 20 MG PO TABS   Oral   Take 20 mg by mouth daily.         Marland Kitchen CLINDAMYCIN HCL 300 MG PO CAPS   Oral   Take 1 capsule (300 mg total) by mouth 3 (three) times daily.   30 capsule   0   . METFORMIN HCL 500 MG PO TABS   Oral   Take 1 tablet (500 mg total) by mouth 2 (two) times daily with a meal.   60 tablet   0   . NON FORMULARY      jobst knee high compression sm misc          . TORSEMIDE 20 MG PO TABS                  Triage Vitals: BP 128/94  Pulse 68  Temp 98.7 F (37.1 C) (Oral)  Resp 20  Ht 5\' 9"  (1.753 m)  Wt 265 lb (120.203 kg)  BMI 39.13 kg/m2  SpO2 100%  Physical Exam  Nursing note and vitals reviewed. Constitutional: He is oriented to person, place, and time. He appears well-developed and well-nourished. No distress.  HENT:  Head: Normocephalic and atraumatic.  Eyes: EOM are normal.  Neck: Neck supple. No tracheal deviation present.  Cardiovascular: Normal rate.   Pulmonary/Chest: Effort normal. No respiratory distress.  Musculoskeletal: He exhibits edema and tenderness.       Left calf tenderness to palpation, 2 cm aberrated area on left anterior tibia with 5 to 6 cm surrounding erythema   Neurological: He is alert and oriented to person, place, and time.  Skin: Skin is warm and dry.  Psychiatric: He has a normal mood and affect. His behavior is normal.    ED Course  Procedures (including critical care time)  DIAGNOSTIC STUDIES: Oxygen Saturation is 100% on room air, normal by my interpretation.    COORDINATION OF CARE: 11:24 AM- Discussed treatment plan which includes IV antibiotics, [place] and CBC with pt at bedside and pt agreed to plan.   11:45 AM- Ordered IV /vancomycin  Labs Reviewed  CBC WITH DIFFERENTIAL - Abnormal; Notable for the following:    Platelets 141 (*)     All other components within normal limits  COMPREHENSIVE METABOLIC PANEL - Abnormal; Notable for the following:    Sodium 133 (*)     Chloride 95 (*)     Glucose, Bld 362 (*)     All other components within normal limits   US Venous Img Lower Unilateral Left  07/03/2012  *RADIOLOGY REPORT*  Clinical Data: r/o DVT;;  LEFT LOWER EXTREMITY VENOUS DUPLEX ULTRASOUND  Technique: Gray-scale sonography with compression, as well as color and duplex ultrasound, were performed to evaluate the deep venous system from the level of the common femoral vein through the popliteal and proximal calf veins.  Comparison:  None  Findings:  Normal compressibility and normal Doppler signal within the common femoral, superficial femoral and popliteal veins, down to the proximal  calf veins.  No grayscale filling defects to suggest DVT.  IMPRESSION: No evidence of left lower extremity deep vein thrombosis.   Original Report Authenticated By: Charlett Nose, M.D.      1. Cellulitis   2. Hyperglycemia       MDM  I personally performed the services described in this documentation, which was scribed in my presence. The recorded information has been reviewed and is accurate.   Pt advised to return in 48 hour for wound/cellulitis re-check. Advised to return immediately for constitutional symptoms, worsening erythema or warmth.   I will start pt on metformin for presumed new onset DM. Pt states he will find PMD to follow.    Matthew Racer, MD 07/03/12 1359

## 2012-07-06 ENCOUNTER — Encounter (HOSPITAL_COMMUNITY): Payer: Self-pay | Admitting: *Deleted

## 2012-07-06 ENCOUNTER — Emergency Department (HOSPITAL_COMMUNITY): Payer: Medicare Other

## 2012-07-06 ENCOUNTER — Emergency Department (HOSPITAL_COMMUNITY)
Admission: EM | Admit: 2012-07-06 | Discharge: 2012-07-06 | Disposition: A | Discharge status: Home / Self Care | Payer: Medicare Other | Attending: Emergency Medicine | Admitting: Emergency Medicine

## 2012-07-06 DIAGNOSIS — I2510 Atherosclerotic heart disease of native coronary artery without angina pectoris: Secondary | ICD-10-CM | POA: Insufficient documentation

## 2012-07-06 DIAGNOSIS — I10 Essential (primary) hypertension: Secondary | ICD-10-CM | POA: Insufficient documentation

## 2012-07-06 DIAGNOSIS — J4489 Other specified chronic obstructive pulmonary disease: Secondary | ICD-10-CM | POA: Insufficient documentation

## 2012-07-06 DIAGNOSIS — Z7982 Long term (current) use of aspirin: Secondary | ICD-10-CM | POA: Insufficient documentation

## 2012-07-06 DIAGNOSIS — Z79899 Other long term (current) drug therapy: Secondary | ICD-10-CM | POA: Insufficient documentation

## 2012-07-06 DIAGNOSIS — Z87891 Personal history of nicotine dependence: Secondary | ICD-10-CM | POA: Insufficient documentation

## 2012-07-06 DIAGNOSIS — Z87448 Personal history of other diseases of urinary system: Secondary | ICD-10-CM | POA: Insufficient documentation

## 2012-07-06 DIAGNOSIS — E669 Obesity, unspecified: Secondary | ICD-10-CM | POA: Insufficient documentation

## 2012-07-06 DIAGNOSIS — J449 Chronic obstructive pulmonary disease, unspecified: Secondary | ICD-10-CM | POA: Insufficient documentation

## 2012-07-06 DIAGNOSIS — Z951 Presence of aortocoronary bypass graft: Secondary | ICD-10-CM | POA: Insufficient documentation

## 2012-07-06 DIAGNOSIS — R079 Chest pain, unspecified: Secondary | ICD-10-CM | POA: Insufficient documentation

## 2012-07-06 LAB — CBC WITH DIFFERENTIAL/PLATELET
Basophils Absolute: 0 10*3/uL (ref 0.0–0.1)
Basophils Relative: 1 % (ref 0–1)
Eosinophils Absolute: 0.3 10*3/uL (ref 0.0–0.7)
Eosinophils Relative: 4 % (ref 0–5)
HCT: 43.2 % (ref 39.0–52.0)
Hemoglobin: 14.8 g/dL (ref 13.0–17.0)
Lymphocytes Relative: 22 % (ref 12–46)
Lymphs Abs: 1.8 10*3/uL (ref 0.7–4.0)
MCH: 30.1 pg (ref 26.0–34.0)
MCHC: 34.3 g/dL (ref 30.0–36.0)
MCV: 87.8 fL (ref 78.0–100.0)
Monocytes Absolute: 0.4 10*3/uL (ref 0.1–1.0)
Monocytes Relative: 5 % (ref 3–12)
Neutro Abs: 5.7 10*3/uL (ref 1.7–7.7)
Neutrophils Relative %: 69 % (ref 43–77)
Platelets: 163 10*3/uL (ref 150–400)
RBC: 4.92 MIL/uL (ref 4.22–5.81)
RDW: 12.8 % (ref 11.5–15.5)
WBC: 8.2 10*3/uL (ref 4.0–10.5)

## 2012-07-06 LAB — BASIC METABOLIC PANEL
BUN: 14 mg/dL (ref 6–23)
CO2: 30 mEq/L (ref 19–32)
Calcium: 9.8 mg/dL (ref 8.4–10.5)
Chloride: 95 mEq/L — ABNORMAL LOW (ref 96–112)
Creatinine, Ser: 1.1 mg/dL (ref 0.50–1.35)
GFR calc Af Amer: 80 mL/min — ABNORMAL LOW (ref >90)
GFR calc non Af Amer: 69 mL/min — ABNORMAL LOW (ref >90)
Glucose, Bld: 279 mg/dL — ABNORMAL HIGH (ref 70–99)
Potassium: 3.9 mEq/L (ref 3.5–5.1)
Sodium: 134 mEq/L — ABNORMAL LOW (ref 135–145)

## 2012-07-06 LAB — TROPONIN I: Troponin I: <0.3 ng/mL (ref <0.30)

## 2012-07-06 MED ORDER — NITROGLYCERIN 0.4 MG SL SUBL: 0.4000 mg | SUBLINGUAL_TABLET | SUBLINGUAL | Status: DC | PRN

## 2012-07-06 MED ORDER — HYDROCODONE-ACETAMINOPHEN 5-325 MG PO TABS: 1.0000 | ORAL_TABLET | Freq: Three times a day (TID) | ORAL | Status: DC | PRN

## 2012-07-06 NOTE — ED Notes (Addendum)
Pt c/o chest pain that started last night and has been off and on today. Pt states he feels like his chest is "stuffy" Pt believes he was placed on a medicine for diabetes and an antibiotic and he feels like his pain is coming from the medicine. Pt was seen here 2 days ago for lower left leg swelling.

## 2012-07-06 NOTE — ED Notes (Signed)
MD at bedside. 

## 2012-07-06 NOTE — ED Provider Notes (Signed)
History     CSN: [id number]  Arrival date & time 07/06/12  1857   First MD Initiated Contact with Patient 07/06/12 1910      Chief Complaint  Patient presents with  . Chest Pain     HPI  The patient presents several days after evaluation for cellulitis of the left distal lower extremity, now with ongoing chest discomfort.  He states over the past 2 days he has had diffuse tenderness across his chest.  No exertional or pleuritic pain.  Mild associated generalized pain.  No nausea, no lightheadedness, no vomiting, no syncope. Pain is not clearly relieved by anything.there is also no clear exacerbating factors. The patient continues to have pain in his left lower extremity, though he notes that this is improved.  The other characteristics of the leg has not changed appreciably.    Past Medical History  Diagnosis Date  . HTN (hypertension)   . CAD (coronary artery disease)   . Renal insufficiency   . COPD (chronic obstructive pulmonary disease)   . Obesity     Past Surgical History  Procedure Date  . Hernia repair   . Tonsillectomy   . Coronary artery bypass graft     2011, LIMA to LAD coronary artery, SVG to OM2 branch of lect circumflex coronary artery, and a sequential SVG to psot descening to posterolateral branches to RCA  . Endoscopic vein harvesting     right leg     History reviewed. No pertinent family history.  History  Substance Use Topics  . Smoking status: Former Smoker    Quit date: 07/11/2009  . Smokeless tobacco: Not on file     Comment: smokes about 10 cig/day; used to smoke 2 ppd for many years   . Alcohol Use: No      Review of Systems  Constitutional:       Per HPI, otherwise negative  HENT:       Per HPI, otherwise negative  Eyes: Negative.   Respiratory:       Per HPI, otherwise negative  Cardiovascular:       Per HPI, otherwise negative  Gastrointestinal: Negative for vomiting.  Genitourinary: Negative.   Musculoskeletal:       Per  HPI, otherwise negative  Skin: Negative.   Neurological: Negative for syncope.    Allergies  Review of patient's allergies indicates no known allergies.  Home Medications   Current Outpatient Rx  Name  Route  Sig  Dispense  Refill  . ASPIRIN 325 MG PO TABS   Oral   Take 325 mg by mouth daily.           Marland Kitchen CLINDAMYCIN HCL 300 MG PO CAPS   Oral   Take 1 capsule (300 mg total) by mouth 3 (three) times daily.   30 capsule   0   . LEVOTHYROXINE SODIUM 25 MCG PO TABS   Oral   Take 1 tablet (25 mcg total) by mouth daily.   30 tablet   11   . METFORMIN HCL 500 MG PO TABS   Oral   Take 1 tablet (500 mg total) by mouth 2 (two) times daily with a meal.   60 tablet   0   . NON FORMULARY      jobst knee high compression sm misc          . POTASSIUM CHLORIDE CRYS ER 20 MEQ PO TBCR   Oral   Take 1 tablet (20 mEq total) by mouth  daily.   30 tablet   10   . SIMVASTATIN 40 MG PO TABS   Oral   Take 1 tablet (40 mg total) by mouth daily.   30 tablet   11   . TORSEMIDE 20 MG PO TABS   Oral   Take 20 mg by mouth daily.         Marland Kitchen HYDROCODONE-ACETAMINOPHEN 5-325 MG PO TABS   Oral   Take 1 tablet by mouth every 8 (eight) hours as needed for pain.   10 tablet   0   . NITROGLYCERIN 0.4 MG SL SUBL   Sublingual   Place 1 tablet (0.4 mg total) under the tongue every 5 (five) minutes as needed for chest pain.   25 tablet   6     BP 144/61  Pulse 68  Temp 97.9 F (36.6 C) (Oral)  Resp 17  Ht 5\' 9"  (1.753 m)  Wt 265 lb (120.203 kg)  BMI 39.13 kg/m2  SpO2 95%  Physical Exam  Nursing note and vitals reviewed. Constitutional: He is oriented to person, place, and time. He appears well-developed. No distress.  HENT:  Head: Normocephalic and atraumatic.  Eyes: Conjunctivae normal and EOM are normal.  Cardiovascular: Normal rate and regular rhythm.   Pulmonary/Chest: Effort normal. No stridor. No respiratory distress.  Abdominal: He exhibits no distension.    Musculoskeletal: He exhibits no edema.  Neurological: He is alert and oriented to person, place, and time.  Skin: Skin is warm and dry.     Psychiatric: He has a normal mood and affect.    ED Course  Procedures (including critical care time)  Labs Reviewed  BASIC METABOLIC PANEL - Abnormal; Notable for the following:    Sodium 134 (*)     Chloride 95 (*)     Glucose, Bld 279 (*)     GFR calc non Af Amer 69 (*)     GFR calc Af Amer 80 (*)     All other components within normal limits  TROPONIN I  CBC WITH DIFFERENTIAL   Dg Chest Portable 1 View  07/06/2012  *RADIOLOGY REPORT*  Clinical Data: Chest pain.  PORTABLE CHEST - 1 VIEW  Comparison: Two-view chest 10/26/2009.  Findings: The heart is enlarged.  The patient is status post median sternotomy for CABG.  Previously seen left basilar airspace disease and effusion have cleared.  Minimal bibasilar atelectasis is evident.  There is no edema or effusion to suggest failure.  IMPRESSION:  1.  Stable cardiomegaly without failure. 2.  Minimal bibasilar atelectasis.   Original Report Authenticated By: Marin Roberts, M.D.      1. Chest pain    Cardiac 70 sinus rhythm normal Pulse ox 100% are normal    Date: 07/06/2012  Rate: 72  Rhythm: normal sinus rhythm  QRS Axis: normal  Intervals: PR prolonged  ST/T Wave abnormalities: nonspecific T wave changes  Conduction Disutrbances:nonspecific intraventricular conduction delay and pvc  Narrative Interpretation:   Old EKG Reviewed: none available  ABNORMAL - non-ischemic   On several repeat evaluation the patient states that he is pain-free.  We discussed all results, x-ray findings, EKG, the patient's history of COPD, long smoking history, need for primary care followup.  The patient requested a refill of his nitroglycerin tablets, as his have expired.   MDM  This patient presents with his after an initial evaluation for left distal lower extremity cellulitis, now with her  systems chest pain for greater than one day.  On exam the patient is in no distress, with unremarkable vital signs, nonischemic EKG, and little suspicion for acute ongoing disease.  The patient's labs, including negative troponin are consistent with the low suspicion of acute coronary ischemia.  We discussed all results, the need for primary care followup, and the patient was discharged with close follow up instructions.        Gerhard Munch, MD 07/06/12 2156

## 2012-07-10 ENCOUNTER — Ambulatory Visit (INDEPENDENT_AMBULATORY_CARE_PROVIDER_SITE_OTHER): Payer: Medicare Other | Admitting: Cardiology

## 2012-07-10 ENCOUNTER — Encounter: Payer: Self-pay | Admitting: Cardiology

## 2012-07-10 VITALS — BP 115/80 | HR 60 | Ht 69.0 in | Wt 250.0 lb

## 2012-07-10 DIAGNOSIS — I10 Essential (primary) hypertension: Secondary | ICD-10-CM

## 2012-07-10 DIAGNOSIS — R229 Localized swelling, mass and lump, unspecified: Secondary | ICD-10-CM

## 2012-07-10 DIAGNOSIS — E785 Hyperlipidemia, unspecified: Secondary | ICD-10-CM

## 2012-07-10 DIAGNOSIS — I2510 Atherosclerotic heart disease of native coronary artery without angina pectoris: Secondary | ICD-10-CM

## 2012-07-10 DIAGNOSIS — R609 Edema, unspecified: Secondary | ICD-10-CM

## 2012-07-10 NOTE — Patient Instructions (Addendum)
The current medical regimen is effective;  continue present plan and medications.  Follow up in 6 months with Dr Hochrein.  You will receive a letter in the mail 2 months before you are due.  Please call us when you receive this letter to schedule your follow up appointment.  

## 2012-07-10 NOTE — Progress Notes (Signed)
HPI The patient returns for followup of his known coronary disease.  Since I last saw him he has been in the emergency room twice. Once was in January with cellulitis of his leg after he bumped his shin. The second time was with chest discomfort after he started his antibiotics. This was severe. It was mid sternal. It happened tonight in a row. It was somewhat sharp without radiation to his jaw or to his arms. I reviewed the emergency room records and his cardiac enzymes were negative. EKG demonstrated no acute findings. Since that time he has had no further symptoms. He says this discomfort was not like his previous angina. He has been doing some walking and his usual activities without bradycardia on further discomfort. He denies any new shortness of breath, PND or orthopnea. He has had no new palpitations, presyncope or syncope.  Of note he has been diagnosed with diabetes and is started on metformin.    No Known Allergies  Current Outpatient Prescriptions  Medication Sig Dispense Refill  . aspirin 325 MG tablet Take 325 mg by mouth daily.        . clindamycin (CLEOCIN) 300 MG capsule Take 1 capsule (300 mg total) by mouth 3 (three) times daily.  30 capsule  0  . levothyroxine (SYNTHROID, LEVOTHROID) 25 MCG tablet Take 1 tablet (25 mcg total) by mouth daily.  30 tablet  11  . metFORMIN (GLUCOPHAGE) 500 MG tablet Take 1 tablet (500 mg total) by mouth 2 (two) times daily with a meal.  60 tablet  0  . nitroGLYCERIN (NITROSTAT) 0.4 MG SL tablet Place 1 tablet (0.4 mg total) under the tongue every 5 (five) minutes as needed for chest pain.  25 tablet  6  . potassium chloride SA (K-DUR,KLOR-CON) 20 MEQ tablet Take 1 tablet (20 mEq total) by mouth daily.  30 tablet  10  . simvastatin (ZOCOR) 40 MG tablet Take 1 tablet (40 mg total) by mouth daily.  30 tablet  11  . torsemide (DEMADEX) 20 MG tablet Take 20 mg by mouth daily.        Past Medical History  Diagnosis Date  . HTN (hypertension)   . CAD  (coronary artery disease)   . Renal insufficiency   . COPD (chronic obstructive pulmonary disease)   . Obesity     Past Surgical History  Procedure Date  . Hernia repair   . Tonsillectomy   . Coronary artery bypass graft     2011, LIMA to LAD coronary artery, SVG to OM2 branch of lect circumflex coronary artery, and a sequential SVG to psot descening to posterolateral branches to RCA  . Endoscopic vein harvesting     right leg     ROS:  As stated in the HPI and negative for all other systems.  PHYSICAL EXAM BP 115/80  Pulse 60  Ht 5\' 9"  (1.753 m)  Wt 250 lb (113.399 kg)  BMI 36.92 kg/m2 GENERAL:  Well appearing HEENT:  Pupils equal round and reactive, fundi not visualized, oral mucosa unremarkable NECK:  No jugular venous distention, waveform within normal limits, carotid upstroke brisk and symmetric, no bruits, no thyromegaly LYMPHATICS:  No cervical, inguinal adenopathy LUNGS:  Clear to auscultation bilaterally BACK:  No CVA tenderness CHEST:  Well healed sternotomy scar. HEART:  PMI not displaced or sustained,S1 and S2 within normal limits, no S3, no S4, no clicks, no rubs, soft right upper sternal border murmur early peaking ABD:  Flat, positive bowel sounds normal  in frequency in pitch, no bruits, no rebound, no guarding, no midline pulsatile mass, no hepatomegaly, no splenomegaly, obese EXT:  2 plus pulses throughout, moderate edema, no cyanosis no clubbing SKIN:  No rashes no nodules NEURO:  Cranial nerves II through XII grossly intact, motor grossly intact throughout PSYCH:  Cognitively intact, oriented to person place and time   ASSESSMENT AND PLAN  CORONARY ATHEROSCLEROSIS NATIVE CORONARY ARTERY - I do not suspect that the pain the other night was cardiac.  He is having no further pain.  No further work up is planned.  He will continue with risk reduciton.  HYPERTENSION -  The blood pressure is at target. No change in medications is indicated. We will continue  with therapeutic lifestyle changes (TLC).   DYSLIPIDEMIA -   I have asked him to establish with a primary MD.  He needs to have his hypothyroidism, diabetes as well as his lipids followed.    Hypothyroidism -   I have renewed this and directed him to followup with his primary providers.  Diabetes - He has been newly diagnosed with this and we discussed this.  He will continue with the metformin.  He will establish with a primary MD.

## 2012-07-16 ENCOUNTER — Other Ambulatory Visit: Payer: Self-pay | Admitting: Cardiology

## 2012-07-31 ENCOUNTER — Other Ambulatory Visit: Payer: Self-pay | Admitting: *Deleted

## 2012-07-31 DIAGNOSIS — E785 Hyperlipidemia, unspecified: Secondary | ICD-10-CM

## 2012-07-31 DIAGNOSIS — E0590 Thyrotoxicosis, unspecified without thyrotoxic crisis or storm: Secondary | ICD-10-CM

## 2012-07-31 MED ORDER — SIMVASTATIN 40 MG PO TABS: 40.0000 mg | ORAL_TABLET | Freq: Every day | ORAL | Status: DC

## 2012-07-31 MED ORDER — LEVOTHYROXINE SODIUM 25 MCG PO TABS: 25.0000 ug | ORAL_TABLET | Freq: Every day | ORAL | Status: DC

## 2012-07-31 NOTE — Telephone Encounter (Signed)
Fax Received. Refill Completed. Matthew May (R.M.A)   

## 2012-08-05 ENCOUNTER — Telehealth: Payer: Self-pay | Admitting: *Deleted

## 2012-08-05 NOTE — Telephone Encounter (Signed)
pharmacy called to say they were changing manufacturers of pts thyroid medication.  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  Pharmacy should notify his PCP.

## 2012-08-12 ENCOUNTER — Other Ambulatory Visit: Payer: Self-pay | Admitting: Cardiology

## 2012-08-26 ENCOUNTER — Ambulatory Visit (INDEPENDENT_AMBULATORY_CARE_PROVIDER_SITE_OTHER): Payer: Medicare Other | Admitting: Pharmacist

## 2012-08-26 VITALS — BP 128/74 | HR 72 | Ht 69.0 in | Wt 245.0 lb

## 2012-08-26 DIAGNOSIS — E559 Vitamin D deficiency, unspecified: Secondary | ICD-10-CM

## 2012-08-26 DIAGNOSIS — E119 Type 2 diabetes mellitus without complications: Secondary | ICD-10-CM | POA: Insufficient documentation

## 2012-08-26 DIAGNOSIS — R609 Edema, unspecified: Secondary | ICD-10-CM

## 2012-08-26 DIAGNOSIS — E1159 Type 2 diabetes mellitus with other circulatory complications: Secondary | ICD-10-CM

## 2012-08-26 MED ORDER — METFORMIN HCL 1000 MG PO TABS: 1000.0000 mg | ORAL_TABLET | Freq: Two times a day (BID) | ORAL | Status: DC

## 2012-08-26 MED ORDER — TORSEMIDE 20 MG PO TABS: 40.0000 mg | ORAL_TABLET | Freq: Every day | ORAL | Status: DC

## 2012-08-26 NOTE — Progress Notes (Signed)
Called [facility] - they confirmed that patient has been getting simvastatin 40mg  filled regularly. Called patient and he does have simvastatin 40mg  at home.  Discussed the importance of compliance with this medication. Pt agrees to take daily and will have cholesterol rechecked in June 2014.  If LDL-P still not at goal will consider medication change.

## 2012-08-26 NOTE — Progress Notes (Signed)
  Subjective:    Patient ID: Matthew May, male    DOB: [birth year], 67 y.o.   MRN: [id number]  HPI:  Pt diagnosed with type 2 DM in Feb. 2014 when he presented to ER for edema.   Most recent A1C was 8.2% on 08/06/2012.  No family h/o of DM.   Pt also c/o of continued edema for last 6 weeks.    Review of Systems: WD obese WM in NAD.   Denies SOB Denies diarrhea since starting metformin Denies polydipsia, polyuria and polyphagia  Low fat/carbohydrate diet?  no Nicotine Abuse? no Medication Compliance?  No - per pt he has not been taking simvastatin because he did know he was suppose to be taking anything for cholesterol Exercise?  occasional walking Alcohol Abuse? no  Glucose Readings Pt's wife checks BG QD - usually in the am Did not bring meter in today but pt reports this am's reading was 220.        Objective:   Physical Exam Filed Vitals:   08/26/12 0845  BP: 128/74  Pulse: 72  BMI:  Body mass index is 36.16 kg/(m^2).   Heart Rate - Regular rate and rhythm 2+ putting edema bilaterally  TSH  25.780 (H)  - 08/06/2012 Vitamin D  13 (L) - 08/06/2012 LDL-P  1393 (H) - 08/06/2012     Assessment & Plan:     Assessment: 1.  type 2 Diabetes.  Recently diagnosis and uncontrollled  2.  Blood Pressure Control.  controlled 3.  Lipid Control.  LDL-P goal of less than 1000/ LDL goal less than 70 4.  Edema  Recommendations: 1.  1800 calorie, carbohydrate counting diet.  Patient is counseled extensively on carbohydrate counting, serving sizes, saturated fat intake and meal planning.  Patient is instructed to eat 3 meals a day and 3 small snacks.  Patient will supplement snacks based on physical activity. 45 grams of CHO or less per meal and 15 grams of CHO or less per snack. 2. 10 minutes of physical activity daily to start with a goal of 50min/day as tolerated by patient.   Patient is counseled to always carry glucose tablets, lifesavers, hard candies, etc., while exercising  in case of hypoglycemic event. 3.  Patient is counseled on pathophysiology of diabetes and the risk of long-term complications.  Fasting blood glucose goals are 70-130mg /dL.  Post-prandial goals are < 180.  A1C goals < 7.0%. 4.  LDL goal of < 70, HDL > 40 and TG < 150; BP goal < 130/80 5.  Continue to check BG daily and vary times during the day.   6.  Increase metformin 1000mg  1 tablet by mouth BID 7.  Increase torsemide 20mg  1 tablets (=40mg ) by mouth daily 8.  Recheck BMET at appt with Dr. Modesto Charon in April 9.  F/U with [facility] in May 2014    Time spent counseling patient:  60 minutes

## 2012-10-29 ENCOUNTER — Ambulatory Visit (INDEPENDENT_AMBULATORY_CARE_PROVIDER_SITE_OTHER): Payer: Medicare Other | Admitting: Physician Assistant

## 2012-10-29 ENCOUNTER — Ambulatory Visit (INDEPENDENT_AMBULATORY_CARE_PROVIDER_SITE_OTHER): Payer: Medicare Other | Admitting: Pharmacist Clinician (PhC)/ Clinical Pharmacy Specialist

## 2012-10-29 ENCOUNTER — Encounter: Payer: Self-pay | Admitting: Physician Assistant

## 2012-10-29 VITALS — BP 90/60 | HR 64 | Ht 69.0 in | Wt 229.1 lb

## 2012-10-29 DIAGNOSIS — R6889 Other general symptoms and signs: Secondary | ICD-10-CM

## 2012-10-29 DIAGNOSIS — I2510 Atherosclerotic heart disease of native coronary artery without angina pectoris: Secondary | ICD-10-CM

## 2012-10-29 DIAGNOSIS — R079 Chest pain, unspecified: Secondary | ICD-10-CM

## 2012-10-29 DIAGNOSIS — I493 Ventricular premature depolarization: Secondary | ICD-10-CM

## 2012-10-29 DIAGNOSIS — I10 Essential (primary) hypertension: Secondary | ICD-10-CM

## 2012-10-29 DIAGNOSIS — E785 Hyperlipidemia, unspecified: Secondary | ICD-10-CM

## 2012-10-29 DIAGNOSIS — I4949 Other premature depolarization: Secondary | ICD-10-CM

## 2012-10-29 DIAGNOSIS — R011 Cardiac murmur, unspecified: Secondary | ICD-10-CM

## 2012-10-29 DIAGNOSIS — E039 Hypothyroidism, unspecified: Secondary | ICD-10-CM

## 2012-10-29 NOTE — Progress Notes (Signed)
Subjective:    Patient ID: Matthew May, male    DOB: 06-15-1945, 67 y.o.   MRN: [id number]  HPI:  Follow up Diabetes Visit    Review of Systems  Constitutional: Negative for appetite change.  Respiratory: Positive for chest tightness.   Cardiovascular: Negative for chest pain.  Skin: Negative.        Objective:   Physical Exam  Constitutional: He is oriented to person, place, and time. He appears well-developed and well-nourished.  Neurological: He is alert and oriented to person, place, and time.  Skin: Skin is warm and dry.  Psychiatric: He has a normal mood and affect. His behavior is normal. Judgment and thought content normal.          Assessment & Plan:   Diabetes Follow-Up Visit Chief Complaint:  No chief complaint on file.    Exam Regularity:  IRIR Edema:  positive  Polyuria:  neg  Polydipsia:  neg Polyphagia:  neg  BMI:  There is no weight on file to calculate BMI.   Weight changes:  Weight loss General Appearance:  alert, oriented, no acute distress Mood/Affect:  normal  HPI:  Follow up visit for type 2 diabetes  Low fat/carbohydrate diet?  No Nicotine Abuse?  No Medication Compliance?  Yes Exercise?  No Alcohol Abuse?  No  Home BG Monitoring:  Checking 1 times a day. Average:  unknown  High: 200  Low:  135   Lab Results  Component Value Date   HGBA1C  Value: 6.1 (NOTE)                                                                       According to the ADA Clinical Practice Recommendations for 2011, when HbA1c is used as a screening test:   >=6.5%   Diagnostic of Diabetes Mellitus           (if abnormal result  is confirmed)  5.7-6.4%   Increased risk of developing Diabetes Mellitus  References:Diagnosis and Classification of Diabetes Mellitus,Diabetes Care,2011,34(Suppl 1):S62-S69 and Standards of Medical Care in         Diabetes - 2011,Diabetes Care,2011,34  (Suppl 1):S11-S61.* 09/30/2009    No results found for this basename: MICROALBUR,  [id number]    Lab Results  Component Value Date   CHOL  Value: 218        ATP III CLASSIFICATION:  <200     mg/dL   Desirable  562-130  mg/dL   Borderline High  >=865    mg/dL   High       * 7/84/6962   HDL 37* 10/01/2009   LDLCALC  Value: 165        Total Cholesterol/HDL:CHD Risk Coronary Heart Disease Risk Table                     Men   Women  1/2 Average Risk   3.4   3.3  Average Risk       5.0   4.4  2 X Average Risk   9.6   7.1  3 X Average Risk  23.4   11.0        Use the calculated Patient Ratio above and the CHD Risk Table to  determine the patient's CHD Risk.        ATP III CLASSIFICATION (LDL):  <100     mg/dL   Optimal  191-478  mg/dL   Near or Above                    Optimal  130-159  mg/dL   Borderline  295-621  mg/dL   High  >308     mg/dL   Very High* 6/57/8469   TRIG 82 10/01/2009   CHOLHDL 5.9 10/01/2009      Assessment: 1.  Diabetes.  Improving control  2.  Blood Pressure.  142/75 3.  Lipids.  Pending check in June, previously LDL-P elevated 4.  Foot Care.  Reviewed daily 5.  Dental Care.  unknown 6.  Eye Care/Exam.  annual  Recommendations: 1.  Patient is counseled on appropriate foot care. 2.  BP goal < 130/80. 3.  LDL goal of < 100, HDL > 40 and TG < 150. 4.  Eye Exam yearly and Dental Exam every 6 months. 5.  Dietary recommendations:  1800 cal ADA diet reviewed 6.  Physical Activity recommendations:  15 minutes  Day as tolerated and ok by cardiologist 7.  Medication recommendations at this time are as follows:  Continue metformin 1 gm bid 8.  Return to clinic in 4-6 wks   Time spent counseling patient:  30 min  Physician time spent with patient:  0 Referring provider:  moore   PharmD:  Chari Manning, [facility]

## 2012-10-29 NOTE — Patient Instructions (Addendum)
LABS TODAY; BMET, CBC W/DIFF, TSH  PLEASE SCHEDULE FOR EXERCISE MYOVIEW DX CHEST PAIN  PLEASE SCHEDULE ECHO DX MURMUR  PLEASE SCHEDULE CAROTID DUPLEX DX BP DISCREPANCY RIGHT 90/60; LEFT 120/70  PLEASE FOLLOW UP WITH DR. HOCHREIN IN [place]  NO MEDICATION CHANGES TODAY

## 2012-10-29 NOTE — Progress Notes (Signed)
[address], [address] [place], [place]  [address] Phone: [phone] Fax:  [phone]  Date:  10/29/2012   ID:  Matthew May, DOB 03-Dec-1945, MRN [id number]  PCP:  No primary provider on file.  Cardiologist:  Dr. Rollene Rotunda     History of Present Illness: Matthew May is a 67 y.o. male who is added on to my schedule today for PVCs.  He has a hx of CAD, s/p CABG in the setting of NSTEMI 09/2009, preserved LVF, HTN, HL, DM2, COPD.  He has had CP off and on since his CABG.  He notes increased CP this past week.  Sometimes has it with positional changes.  Also gets it at rest.  No assoc symptoms.  No dyspnea.  No syncope.  No orthopnea, PND, edema.  No palpitations.  Describes chest pain as "chest is full."  He discussed his symptoms with his PCP today.  ECG was done and he was referred here for evaluation.    Labs (1/14):  K 4, Cr 0.78, ALT 36 Labs (2/14):  K 3.9, Cr 1.10, Hgb 14.8  Wt Readings from Last 3 Encounters:  10/29/12 229 lb 1.9 oz (103.928 kg)  08/26/12 245 lb (111.131 kg)  07/10/12 250 lb (113.399 kg)     Past Medical History  Diagnosis Date  . HTN (hypertension)   . CAD (coronary artery disease)     s/p NSTEMI 4/11 => s/p CABG (L-LAD, S-OM2, S-PDA/PL)  . Renal insufficiency   . COPD (chronic obstructive pulmonary disease)   . Obesity   . HLD (hyperlipidemia)   . Leg edema     Current Outpatient Prescriptions  Medication Sig Dispense Refill  . aspirin 325 MG tablet Take 325 mg by mouth daily.        Marland Kitchen KLOR-CON M20 20 MEQ tablet TAKE ONE TABLET BY MOUTH ONCE DAILY  30 tablet  6  . levothyroxine (SYNTHROID, LEVOTHROID) 75 MCG tablet Take 75 mcg by mouth daily.      . metFORMIN (GLUCOPHAGE) 1000 MG tablet Take 1 tablet (1,000 mg total) by mouth 2 (two) times daily with a meal.  180 tablet  0  . nitroGLYCERIN (NITROSTAT) 0.4 MG SL tablet Place 1 tablet (0.4 mg total) under the tongue every 5 (five) minutes as needed for chest pain.  25 tablet  6    . simvastatin (ZOCOR) 40 MG tablet Take 1 tablet (40 mg total) by mouth daily.  30 tablet  6  . torsemide (DEMADEX) 20 MG tablet Take 2 tablets (40 mg total) by mouth daily.  180 tablet  0  . Vitamin D, Ergocalciferol, (DRISDOL) 50000 UNITS CAPS Take 50,000 Units by mouth every 7 (seven) days.       No current facility-administered medications for this visit.    Allergies:   No Known Allergies  Social History:  The patient  reports that he quit smoking about 3 years ago. He does not have any smokeless tobacco history on file. He reports that he does not drink alcohol or use illicit drugs.   ROS:  Please see the history of present illness.   He occasionally gets lightheadedness.  This lasts just seconds.   All other systems reviewed and negative.   PHYSICAL EXAM: VS:  BP 120/70  Pulse 64  Ht 5\' 9"  (1.753 m)  Wt 229 lb 1.9 oz (103.928 kg)  BMI 33.82 kg/m2  SpO2 98% L arm 120/70;  R arm 90/60  Well nourished, well developed,  in no acute distress HEENT: normal Neck: no JVD Vascular:  No carotid bruits Cardiac:  normal S1, S2; RRR; 2/6 harsh systolic murmur at the RUSB Lungs:  clear to auscultation bilaterally, no wheezing, rhonchi or rales Abd: soft, nontender, no hepatomegaly Ext: trace to 1+ LE edema Skin: warm and dry Neuro:  CNs 2-12 intact, no focal abnormalities noted  EKG:  NSR, HR 67, PVC, no change from prior tracing     ASSESSMENT AND PLAN:  1. Chest Pain:  He has had CP off and on since his CABG.  Pain seems to have escalated recently. He is a diabetic.  It has been 3 years since his CABG.  Will arrange ETT-Myoview.  Check CBC, BMET, TSH. 2. Murmur:  I cannot find an echo in his chart.  Heart sounds are difficult to hear. But, this sounds like AS.  Will obtain an echocardiogram. 3. PVC's:  Largely asymptomatic.  Check BMET and TSH. 4. BP Discrepancy:  He has a 30 point difference in systolic BP from L to R arms.  No syncope or near syncope.  No carotid or subclavian  bruits.  Will obtain carotid dopplers to r/o retrograde vertebral flow.  He has been advised to have his BP taken on the left. 5. CAD:  Continue ASA and statin.  He has had a lot of chest pain.  Obtain myoview as noted. 6. Hypertension:  Controlled. 7. Hyperlipidemia:  Continue statin. 8. LE Edema:  Continue torsemide.  Check BMET today. 9. Hypothyroidism:  Check TSH today.  10. Disposition:  F/u with Dr. Rollene Rotunda in 1 month.   Signed, Tereso Newcomer, PA-C  10/29/2012 4:54 PM

## 2012-10-30 ENCOUNTER — Telehealth: Payer: Self-pay | Admitting: *Deleted

## 2012-10-30 LAB — CBC WITH DIFFERENTIAL/PLATELET
Basophils Absolute: 0 10*3/uL (ref 0.0–0.1)
Basophils Relative: 0.2 % (ref 0.0–3.0)
Eosinophils Absolute: 0.3 10*3/uL (ref 0.0–0.7)
Eosinophils Relative: 3.1 % (ref 0.0–5.0)
HCT: 41.2 % (ref 39.0–52.0)
Hemoglobin: 13.9 g/dL (ref 13.0–17.0)
Lymphocytes Relative: 23.3 % (ref 12.0–46.0)
Lymphs Abs: 2.1 10*3/uL (ref 0.7–4.0)
MCHC: 33.7 g/dL (ref 30.0–36.0)
MCV: 87.5 fl (ref 78.0–100.0)
Monocytes Absolute: 0.5 10*3/uL (ref 0.1–1.0)
Monocytes Relative: 5.7 % (ref 3.0–12.0)
Neutro Abs: 6 10*3/uL (ref 1.4–7.7)
Neutrophils Relative %: 67.7 % (ref 43.0–77.0)
Platelets: 162 10*3/uL (ref 150.0–400.0)
RBC: 4.7 Mil/uL (ref 4.22–5.81)
RDW: 14.1 % (ref 11.5–14.6)
WBC: 8.8 10*3/uL (ref 4.5–10.5)

## 2012-10-30 LAB — BASIC METABOLIC PANEL
BUN: 14 mg/dL (ref 6–23)
CO2: 31 mEq/L (ref 19–32)
Calcium: 8.7 mg/dL (ref 8.4–10.5)
Chloride: 97 mEq/L (ref 96–112)
Creatinine, Ser: 1 mg/dL (ref 0.4–1.5)
GFR: 84.51 mL/min (ref >60.00)
Glucose, Bld: 99 mg/dL (ref 70–99)
Potassium: 3.7 mEq/L (ref 3.5–5.1)
Sodium: 136 mEq/L (ref 135–145)

## 2012-10-30 LAB — TSH: TSH: 11.12 u[IU]/mL — ABNORMAL HIGH (ref 0.35–5.50)

## 2012-10-30 NOTE — Telephone Encounter (Signed)
pt notified about lab results and will mail copy of results to pt today, pt aware to f/u w/PCP about elevated TSH and needs to have synthroid adjusted for hypothyroidism, pt verbalized understanding today

## 2012-10-30 NOTE — Telephone Encounter (Signed)
Message copied by Tarri Fuller on Wed Oct 30, 2012  4:50 PM ------      Message from: [place], [place] T      Created: Wed Oct 30, 2012  1:41 PM       K+, creatinine, Hgb ok.      TSH elevated.      Please fax copy of labs to PCP and have patient f/u with PCP to adjust synthroid for hypothyroidism.      Tereso Newcomer, PA-C        10/30/2012 1:41 PM ------

## 2012-11-06 ENCOUNTER — Encounter: Payer: Self-pay | Admitting: Family Medicine

## 2012-11-06 ENCOUNTER — Encounter (INDEPENDENT_AMBULATORY_CARE_PROVIDER_SITE_OTHER): Payer: Medicare Other | Admitting: Family Medicine

## 2012-11-06 NOTE — Progress Notes (Signed)
Pt left without being seen.

## 2012-11-06 NOTE — Progress Notes (Signed)
Patient ID: Matthew May, male   DOB: 1946-03-15, 67 y.o.   MRN: [id number] Not seen. Patient left. [name] P. [place] Charon, M.D.

## 2012-11-12 ENCOUNTER — Other Ambulatory Visit: Payer: Self-pay | Admitting: Family Medicine

## 2012-11-12 ENCOUNTER — Encounter (INDEPENDENT_AMBULATORY_CARE_PROVIDER_SITE_OTHER): Payer: Medicare Other

## 2012-11-12 ENCOUNTER — Ambulatory Visit (HOSPITAL_COMMUNITY): Payer: Medicare Other | Attending: Cardiology | Admitting: Radiology

## 2012-11-12 ENCOUNTER — Telehealth: Payer: Self-pay | Admitting: *Deleted

## 2012-11-12 ENCOUNTER — Ambulatory Visit (HOSPITAL_BASED_OUTPATIENT_CLINIC_OR_DEPARTMENT_OTHER): Payer: Medicare Other | Admitting: Radiology

## 2012-11-12 ENCOUNTER — Encounter: Payer: Self-pay | Admitting: Physician Assistant

## 2012-11-12 VITALS — BP 129/60 | Ht 67.5 in | Wt 229.0 lb

## 2012-11-12 DIAGNOSIS — I4949 Other premature depolarization: Secondary | ICD-10-CM | POA: Insufficient documentation

## 2012-11-12 DIAGNOSIS — E669 Obesity, unspecified: Secondary | ICD-10-CM | POA: Insufficient documentation

## 2012-11-12 DIAGNOSIS — R011 Cardiac murmur, unspecified: Secondary | ICD-10-CM

## 2012-11-12 DIAGNOSIS — J4489 Other specified chronic obstructive pulmonary disease: Secondary | ICD-10-CM | POA: Insufficient documentation

## 2012-11-12 DIAGNOSIS — G458 Other transient cerebral ischemic attacks and related syndromes: Secondary | ICD-10-CM

## 2012-11-12 DIAGNOSIS — Z951 Presence of aortocoronary bypass graft: Secondary | ICD-10-CM | POA: Insufficient documentation

## 2012-11-12 DIAGNOSIS — I6529 Occlusion and stenosis of unspecified carotid artery: Secondary | ICD-10-CM

## 2012-11-12 DIAGNOSIS — E119 Type 2 diabetes mellitus without complications: Secondary | ICD-10-CM | POA: Insufficient documentation

## 2012-11-12 DIAGNOSIS — I079 Rheumatic tricuspid valve disease, unspecified: Secondary | ICD-10-CM | POA: Insufficient documentation

## 2012-11-12 DIAGNOSIS — Z87891 Personal history of nicotine dependence: Secondary | ICD-10-CM | POA: Insufficient documentation

## 2012-11-12 DIAGNOSIS — R002 Palpitations: Secondary | ICD-10-CM | POA: Insufficient documentation

## 2012-11-12 DIAGNOSIS — R609 Edema, unspecified: Secondary | ICD-10-CM | POA: Insufficient documentation

## 2012-11-12 DIAGNOSIS — R6889 Other general symptoms and signs: Secondary | ICD-10-CM

## 2012-11-12 DIAGNOSIS — R42 Dizziness and giddiness: Secondary | ICD-10-CM | POA: Insufficient documentation

## 2012-11-12 DIAGNOSIS — E039 Hypothyroidism, unspecified: Secondary | ICD-10-CM | POA: Insufficient documentation

## 2012-11-12 DIAGNOSIS — E785 Hyperlipidemia, unspecified: Secondary | ICD-10-CM | POA: Insufficient documentation

## 2012-11-12 DIAGNOSIS — R079 Chest pain, unspecified: Secondary | ICD-10-CM

## 2012-11-12 DIAGNOSIS — J449 Chronic obstructive pulmonary disease, unspecified: Secondary | ICD-10-CM | POA: Insufficient documentation

## 2012-11-12 DIAGNOSIS — I359 Nonrheumatic aortic valve disorder, unspecified: Secondary | ICD-10-CM | POA: Insufficient documentation

## 2012-11-12 DIAGNOSIS — R0602 Shortness of breath: Secondary | ICD-10-CM

## 2012-11-12 DIAGNOSIS — I2510 Atherosclerotic heart disease of native coronary artery without angina pectoris: Secondary | ICD-10-CM

## 2012-11-12 DIAGNOSIS — I252 Old myocardial infarction: Secondary | ICD-10-CM | POA: Insufficient documentation

## 2012-11-12 DIAGNOSIS — I10 Essential (primary) hypertension: Secondary | ICD-10-CM | POA: Insufficient documentation

## 2012-11-12 MED ORDER — TECHNETIUM TC 99M SESTAMIBI GENERIC - CARDIOLITE
30.0000 | Freq: Once | INTRAVENOUS | Status: AC | PRN
  Administered 2012-11-12: 30 via INTRAVENOUS

## 2012-11-12 MED ORDER — TECHNETIUM TC 99M SESTAMIBI GENERIC - CARDIOLITE
10.0000 | Freq: Once | INTRAVENOUS | Status: AC | PRN
  Administered 2012-11-12: 10 via INTRAVENOUS

## 2012-11-12 NOTE — Telephone Encounter (Signed)
pt notified about echo results with verbal understanding; repeat echo in 1 yr

## 2012-11-12 NOTE — Progress Notes (Signed)
[facility] SITE 3 NUCLEAR MED [address] [place], [place] [address] [phone]    Cardiology Nuclear Med Study  Matthew May is a 67 y.o. male     MRN : [id number]     DOB: [birth year]  Procedure Date: 11/12/2012  Nuclear Med Background Indication for Stress Test:  Evaluation for Ischemia History:  COPD and '11 MI-Heart Cath: EF: 65% -CABG Cardiac Risk Factors: History of Smoking, Hypertension and Lipids  Symptoms:  Chest Pain, Light-Headedness and Palpitations   Nuclear Pre-Procedure Caffeine/Decaff Intake:  None NPO After: 10:30pm   Lungs:  clear O2 Sat: 94-98% on room air. IV 0.9% NS with Angio Cath:  20g  IV Site: R Antecubital  IV Started by:  Stanton Kidney, EMT-P  Chest Size (in):  44 Cup Size: n/a  Height: 5' 7.5" (1.715 m)  Weight:  229 lb (103.874 kg)  BMI:  Body mass index is 35.32 kg/(m^2). Tech Comments:  CBG= 140 @ 8am, per patient.    Nuclear Med Study 1 or 2 day study: 1 day  Stress Test Type:  Stress  Reading MD: Kristeen Miss, MD  Order Authorizing Provider:  J. Hochrein  Resting Radionuclide: Technetium 41m Sestamibi  Resting Radionuclide Dose: 11.0 mCi   Stress Radionuclide:  Technetium 77m Sestamibi  Stress Radionuclide Dose: 33.0 mCi           Stress Protocol Rest HR: 70 Stress HR: 136  Rest BP: 129/60 Stress BP: 188/59  Exercise Time (min): 5:00 METS: 6.50   Predicted Max HR: 155 bpm % Max HR: 87.74 bpm Rate Pressure Product: 21308   Dose of Adenosine (mg):  n/a Dose of Lexiscan: n/a mg  Dose of Atropine (mg): n/a Dose of Dobutamine: n/a mcg/kg/min (at max HR)  Stress Test Technologist: Milana Na, EMT-P  Nuclear Technologist:  Domenic Polite, CNMT     Rest Procedure:  Myocardial perfusion imaging was performed at rest 45 minutes following the intravenous administration of Technetium 56m Sestamibi. Rest ECG: NSR with frequent PVCs,   Stress Procedure:  The patient exercised on the treadmill utilizing the Bruce  Protocol for 5:00 minutes. The patient stopped due to sob, fatigue, freq pvcs/trigeminy and denied any chest pain.  Technetium 78m Sestamibi was injected at peak exercise and myocardial perfusion imaging was performed after a brief delay. Stress ECG: No significant ST segment change suggestive of ischemia.  QPS Raw Data Images:  Normal; no motion artifact; normal heart/lung ratio. Stress Images:  Normal homogeneous uptake in all areas of the myocardium. Rest Images:  Normal homogeneous uptake in all areas of the myocardium. Subtraction (SDS):  No evidence of ischemia. Transient Ischemic Dilatation (Normal <1.22):  0.97 Lung/Heart Ratio (Normal <0.45):  0.34  Quantitative Gated Spect Images QGS EDV:  101 ml QGS ESV:  33 ml  Impression Exercise Capacity:  Fair exercise capacity. BP Response:  Normal blood pressure response. Clinical Symptoms:  No significant symptoms noted. ECG Impression:  No significant ST segment change suggestive of ischemia. Comparison with Prior Nuclear Study: No images to compare  Overall Impression:  Normal stress nuclear study.  LV Ejection Fraction: 67%.  LV Wall Motion:  NL LV Function; NL Wall Motion.   Vesta Mixer, Montez Hageman., MD, [facility] 11/12/2012, 3:36 PM Office - [phone] Pager [phone]

## 2012-11-12 NOTE — Progress Notes (Signed)
Echocardiogram performed.  

## 2012-11-13 ENCOUNTER — Other Ambulatory Visit: Payer: Self-pay | Admitting: Family Medicine

## 2012-11-13 ENCOUNTER — Telehealth: Payer: Self-pay | Admitting: Family Medicine

## 2012-11-13 DIAGNOSIS — E039 Hypothyroidism, unspecified: Secondary | ICD-10-CM

## 2012-11-13 MED ORDER — LEVOTHYROXINE SODIUM 88 MCG PO TABS: 75.0000 ug | ORAL_TABLET | Freq: Every day | ORAL | Status: DC

## 2012-11-13 NOTE — Telephone Encounter (Signed)
[facility] notfiied and rx given for torsemide and levothyroxine

## 2012-11-14 ENCOUNTER — Telehealth: Payer: Self-pay | Admitting: *Deleted

## 2012-11-14 ENCOUNTER — Encounter: Payer: Self-pay | Admitting: Physician Assistant

## 2012-11-14 DIAGNOSIS — I2510 Atherosclerotic heart disease of native coronary artery without angina pectoris: Secondary | ICD-10-CM

## 2012-11-14 NOTE — Telephone Encounter (Signed)
no answer at the Home # or cell #. I will try again later tcb to give myoview and carotid results and recommendations

## 2012-11-14 NOTE — Telephone Encounter (Signed)
Message copied by Tarri Fuller on Thu Nov 14, 2012  9:34 AM ------      Message from: [place], [place] T      Created: Thu Nov 14, 2012  9:15 AM       Mild to mod plaque bilat carotids - repeat carotid U/S in 1 year.      Suggestion of right subclavian steal on dopplers.  His R arm BP is much lower.  He did note occasional lightheadedness when I saw him.      Probably nothing has to be done for this.      But, would like to refer to PV (Dr. Kirke Corin or Dr. Clifton James) for consultation of right subclavian steal to make sure.      Tereso Newcomer, PA-C        11/14/2012 9:11 AM ------

## 2012-11-15 ENCOUNTER — Telehealth: Payer: Self-pay | Admitting: *Deleted

## 2012-11-15 NOTE — Telephone Encounter (Signed)
error 

## 2012-11-15 NOTE — Telephone Encounter (Signed)
pt notified about myoview and carotid results. Per Bing Neighbors. PA pt needs PV consult per carotid results. will have scheduling call pt with date , time and provider

## 2012-11-27 ENCOUNTER — Ambulatory Visit (INDEPENDENT_AMBULATORY_CARE_PROVIDER_SITE_OTHER): Payer: Medicare Other | Admitting: Cardiology

## 2012-11-27 ENCOUNTER — Encounter: Payer: Self-pay | Admitting: Cardiology

## 2012-11-27 VITALS — BP 133/78 | HR 68 | Ht 67.0 in | Wt 228.4 lb

## 2012-11-27 DIAGNOSIS — I10 Essential (primary) hypertension: Secondary | ICD-10-CM

## 2012-11-27 DIAGNOSIS — I2510 Atherosclerotic heart disease of native coronary artery without angina pectoris: Secondary | ICD-10-CM

## 2012-11-27 DIAGNOSIS — E785 Hyperlipidemia, unspecified: Secondary | ICD-10-CM

## 2012-11-27 NOTE — Progress Notes (Signed)
HPI The patient returns for followup of his known coronary disease.  Since I last saw him he was in the emergency room again. I reviewed these records. He came back to our office. He has been having some PVCs. He was sent for a stress perfusion study which demonstrated an EF of 67% with no ischemia. He had a carotid Doppler which demonstrated some moderate carotid plaque suggested a possible right subclavian steal. Because he is having dizziness he has been set up to see one of our PV specialist. Finally he had an echocardiogram which demonstrated only mild aortic stenosis.  He does think he's feeling pretty well. He has some dizziness though it is not necessarily with positions of his arm overhead. He's not had any frank syncope. He does feel some palpitations although these are not particularly severe or sustained. He's had no further chest pressure, neck or arm discomfort. He's had no new shortness of breath, PND or orthopnea. He's lost about 37 pounds over the course of several months.  He remains very active in his job.  No Known Allergies  Current Outpatient Prescriptions  Medication Sig Dispense Refill  . aspirin 325 MG tablet Take 325 mg by mouth daily.        Marland Kitchen KLOR-CON M20 20 MEQ tablet TAKE ONE TABLET BY MOUTH ONCE DAILY  30 tablet  6  . levothyroxine (SYNTHROID, LEVOTHROID) 88 MCG tablet Take 1 tablet (88 mcg total) by mouth daily.  30 tablet  3  . metFORMIN (GLUCOPHAGE) 1000 MG tablet Take 1 tablet (1,000 mg total) by mouth 2 (two) times daily with a meal.  180 tablet  0  . nitroGLYCERIN (NITROSTAT) 0.4 MG SL tablet Place 1 tablet (0.4 mg total) under the tongue every 5 (five) minutes as needed for chest pain.  25 tablet  6  . simvastatin (ZOCOR) 40 MG tablet Take 1 tablet (40 mg total) by mouth daily.  30 tablet  6  . torsemide (DEMADEX) 20 MG tablet TAKE TWO TABLETS BY MOUTH ONCE DAILY  180 tablet  0  . Vitamin D, Ergocalciferol, (DRISDOL) 50000 UNITS CAPS Take 50,000 Units by  mouth every 7 (seven) days.       No current facility-administered medications for this visit.    Past Medical History  Diagnosis Date  . HTN (hypertension)   . CAD (coronary artery disease)     a. s/p NSTEMI 4/11 => s/p CABG (L-LAD, S-OM2, S-PDA/PL);  b.  ETT-Myoview 6/14:  normal study, no ischemia, EF 67%  . Renal insufficiency   . COPD (chronic obstructive pulmonary disease)   . Obesity   . HLD (hyperlipidemia)   . Leg edema   . Hx of echocardiogram     Echo 6/14: Mod LVH, focal basal hypertrophy, EF 50-55%, mild AS (mean 17 mmHg) and mild AI, mild to mod LAE, mild RAE  . Carotid stenosis     Carotid U/S 6/14:  RICA 1-39%, LICA 40-59%; right vertebral flow retrograde suggestive of steel-consider PV consult  . Subclavian artery stenosis, right     based upon carotid U/S done 11/2012    Past Surgical History  Procedure Laterality Date  . Hernia repair    . Tonsillectomy    . Coronary artery bypass graft      2011, LIMA to LAD coronary artery, SVG to OM2 branch of lect circumflex coronary artery, and a sequential SVG to psot descening to posterolateral branches to RCA  . Endoscopic vein harvesting  right leg     ROS:  As stated in the HPI and negative for all other systems.  PHYSICAL EXAM BP 133/78  Pulse 68  Ht 5\' 7"  (1.702 m)  Wt 228 lb 6.4 oz (103.602 kg)  BMI 35.76 kg/m2 GENERAL:  Well appearing HEENT:  Pupils equal round and reactive, fundi not visualized, oral mucosa unremarkable NECK:  No jugular venous distention, waveform within normal limits, carotid upstroke brisk and symmetric, no bruits, no thyromegaly LYMPHATICS:  No cervical, inguinal adenopathy LUNGS:  Clear to auscultation bilaterally BACK:  No CVA tenderness CHEST:  Well healed sternotomy scar. HEART:  PMI not displaced or sustained,S1 and S2 within normal limits, no S3, no S4, no clicks, no rubs, soft right upper sternal border murmur early peaking ABD:  Flat, positive bowel sounds normal in  frequency in pitch, no bruits, no rebound, no guarding, no midline pulsatile mass, no hepatomegaly, no splenomegaly, obese EXT:  2 plus pulses throughout, moderate edema, no cyanosis no clubbing SKIN:  No rashes no nodules NEURO:  Cranial nerves II through XII grossly intact, motor grossly intact throughout PSYCH:  Cognitively intact, oriented to person place and time  EKG: Sinus rhythm, rate 68, axis within normal limits, intervals within normal limits, no acute ST-T wave changes.  11/27/2012   ASSESSMENT AND PLAN  CORONARY ATHEROSCLEROSIS NATIVE CORONARY ARTERY - I reviewed the results of his stress perfusion study. No further testing is suggested. He will continue with risk reduction.  HYPERTENSION -  The blood pressure is at target. No change in medications is indicated. We will continue with therapeutic lifestyle changes (TLC).   DYSLIPIDEMIA -   I have asked him to establish with a primary MD.  He needs to have his hypothyroidism, diabetes as well as his lipids followed.    PVD -   I don't really think that he had symptoms consistent with subclavian steal. However, he would like to keep his appointment in PV.    PVCs - At this point he is a mild but he will let me know if they worsen. I will likely add a low-dose beta blocker at that point.  Diabetes - He has been newly diagnosed with this and we discussed this.  He will continue with the metformin.  His weight loss should help with this area and  AS - This is mild and we can follow this clinically.

## 2012-11-27 NOTE — Patient Instructions (Addendum)
The current medical regimen is effective;  continue present plan and medications.  Follow up in 6 months with Dr Hochrein.  You will receive a letter in the mail 2 months before you are due.  Please call us when you receive this letter to schedule your follow up appointment.  

## 2012-12-02 ENCOUNTER — Other Ambulatory Visit: Payer: Self-pay | Admitting: Family Medicine

## 2012-12-03 ENCOUNTER — Encounter: Payer: Self-pay | Admitting: Cardiovascular Disease

## 2012-12-03 ENCOUNTER — Ambulatory Visit (INDEPENDENT_AMBULATORY_CARE_PROVIDER_SITE_OTHER): Payer: Medicare Other | Admitting: Cardiovascular Disease

## 2012-12-03 VITALS — BP 132/70 | HR 64 | Ht 67.0 in | Wt 226.8 lb

## 2012-12-03 DIAGNOSIS — I708 Atherosclerosis of other arteries: Secondary | ICD-10-CM

## 2012-12-03 DIAGNOSIS — I771 Stricture of artery: Secondary | ICD-10-CM

## 2012-12-03 NOTE — Progress Notes (Signed)
HPI This is a 67 year old male who is referred for evaluation of right subclavian stenosis and possible steal syndrome. He has known history of coronary artery disease status post CABG. He was noted to have discrepancy in blood pressure between the right and left arm. He had a carotid Doppler which demonstrated  moderate carotid plaque with evidence of right subclavian stenosis with retrograde flow in the vertebral artery.  He is right-handed and denies any right arm discomfort even with intense physical activities. He does complain of dizziness but that's mainly happens with sudden standing up. It does not happen after using the right arm. He denies any nausea, vomiting or vision changes.  No Known Allergies  Current Outpatient Prescriptions  Medication Sig Dispense Refill  . aspirin 325 MG tablet Take 325 mg by mouth daily.        Marland Kitchen KLOR-CON M20 20 MEQ tablet TAKE ONE TABLET BY MOUTH ONCE DAILY  30 tablet  6  . levothyroxine (SYNTHROID, LEVOTHROID) 88 MCG tablet Take 1 tablet (88 mcg total) by mouth daily.  30 tablet  3  . metFORMIN (GLUCOPHAGE) 1000 MG tablet Take 1 tablet (1,000 mg total) by mouth 2 (two) times daily with a meal.  180 tablet  0  . nitroGLYCERIN (NITROSTAT) 0.4 MG SL tablet Place 1 tablet (0.4 mg total) under the tongue every 5 (five) minutes as needed for chest pain.  25 tablet  6  . simvastatin (ZOCOR) 40 MG tablet Take 1 tablet (40 mg total) by mouth daily.  30 tablet  6  . torsemide (DEMADEX) 20 MG tablet TAKE TWO TABLETS BY MOUTH ONCE DAILY  180 tablet  0  . Vitamin D, Ergocalciferol, (DRISDOL) 50000 UNITS CAPS Take 50,000 Units by mouth every 7 (seven) days.       No current facility-administered medications for this visit.    Past Medical History  Diagnosis Date  . HTN (hypertension)   . CAD (coronary artery disease)     a. s/p NSTEMI 4/11 => s/p CABG (L-LAD, S-OM2, S-PDA/PL);  b.  ETT-Myoview 6/14:  normal study, no ischemia, EF 67%  . Renal insufficiency   .  COPD (chronic obstructive pulmonary disease)   . Obesity   . HLD (hyperlipidemia)   . Leg edema   . Hx of echocardiogram     Echo 6/14: Mod LVH, focal basal hypertrophy, EF 50-55%, mild AS (mean 17 mmHg) and mild AI, mild to mod LAE, mild RAE  . Carotid stenosis     Carotid U/S 6/14:  RICA 1-39%, LICA 40-59%; right vertebral flow retrograde suggestive of steel-consider PV consult  . Subclavian artery stenosis, right     based upon carotid U/S done 11/2012    Past Surgical History  Procedure Laterality Date  . Hernia repair    . Tonsillectomy    . Coronary artery bypass graft      2011, LIMA to LAD coronary artery, SVG to OM2 branch of lect circumflex coronary artery, and a sequential SVG to psot descening to posterolateral branches to RCA  . Endoscopic vein harvesting      right leg     ROS:  As stated in the HPI and negative for all other systems.  PHYSICAL EXAM BP 132/70  Pulse 64  Ht 5\' 7"  (1.702 m)  Wt 226 lb 12.8 oz (102.876 kg)  BMI 35.51 kg/m2  SpO2 98% GENERAL:  Well appearing HEENT:  Pupils equal round and reactive, fundi not visualized, oral mucosa unremarkable NECK:  No  jugular venous distention, waveform within normal limits, carotid upstroke brisk and symmetric, no bruits, no thyromegaly LYMPHATICS:  No cervical, inguinal adenopathy LUNGS:  Clear to auscultation bilaterally BACK:  No CVA tenderness CHEST:  Well healed sternotomy scar. HEART:  PMI not displaced or sustained,S1 and S2 within normal limits, no S3, no S4, no clicks, no rubs, soft right upper sternal border murmur early peaking ABD:  Flat, positive bowel sounds normal in frequency in pitch, no bruits, no rebound, no guarding, no midline pulsatile mass, no hepatomegaly, no splenomegaly, obese EXT:  2 plus pulses throughout, moderate edema, no cyanosis no clubbing SKIN:  No rashes no nodules NEURO:  Cranial nerves II through XII grossly intact, motor grossly intact throughout PSYCH:  Cognitively  intact, oriented to person place and time Right radial pulse is very diminished. Left radial pulse is normal.   ASSESSMENT AND PLAN

## 2012-12-03 NOTE — Patient Instructions (Addendum)
Your physician recommends that you schedule a follow-up appointment as needed  

## 2012-12-03 NOTE — Assessment & Plan Note (Addendum)
The patient seems to have at least moderate right subclavian artery stenosis with diminished radial pulse and blood pressure discrepancy between the left and right arm. By Doppler, there was evidence of retrograde flow in the left vertebral artery suggestive of steal by Doppler criteria. However, clinically he does not have symptoms of subclavian steal syndrome even with vigorous use of his right arm. He denies any arm claudication. His dizziness seems to be suggestive more of orthostatic hypotension.  Based on the above, there is no indication for revascularization. Continue observation for now. I discussed with him the expected symptoms of this to get worse.  His blood pressure should be always checked in the left arm which better reflects his central aortic pressure.

## 2012-12-26 ENCOUNTER — Ambulatory Visit (INDEPENDENT_AMBULATORY_CARE_PROVIDER_SITE_OTHER): Payer: Medicare Other | Admitting: Family Medicine

## 2012-12-26 ENCOUNTER — Encounter: Payer: Self-pay | Admitting: Family Medicine

## 2012-12-26 VITALS — BP 140/70 | HR 61 | Temp 97.0°F | Wt 224.6 lb

## 2012-12-26 DIAGNOSIS — E039 Hypothyroidism, unspecified: Secondary | ICD-10-CM | POA: Insufficient documentation

## 2012-12-26 DIAGNOSIS — E785 Hyperlipidemia, unspecified: Secondary | ICD-10-CM

## 2012-12-26 DIAGNOSIS — E559 Vitamin D deficiency, unspecified: Secondary | ICD-10-CM

## 2012-12-26 DIAGNOSIS — E1159 Type 2 diabetes mellitus with other circulatory complications: Secondary | ICD-10-CM

## 2012-12-26 DIAGNOSIS — I10 Essential (primary) hypertension: Secondary | ICD-10-CM

## 2012-12-26 DIAGNOSIS — I771 Stricture of artery: Secondary | ICD-10-CM

## 2012-12-26 DIAGNOSIS — I2510 Atherosclerotic heart disease of native coronary artery without angina pectoris: Secondary | ICD-10-CM

## 2012-12-26 DIAGNOSIS — I708 Atherosclerosis of other arteries: Secondary | ICD-10-CM

## 2012-12-26 LAB — POCT GLYCOSYLATED HEMOGLOBIN (HGB A1C): Hemoglobin A1C: 6

## 2012-12-26 MED ORDER — METFORMIN HCL 1000 MG PO TABS: ORAL_TABLET | ORAL | Status: DC

## 2012-12-26 NOTE — Patient Instructions (Addendum)
      Dr Cadin Luka's Recommendations  Diet and Exercise discussed with patient.  For nutrition information, I recommend books:  1).Eat to Live by Dr Joel Fuhrman. 2).Prevent and Reverse Heart Disease by Dr Caldwell Esselstyn. 3) Dr Neal Barnard's Book:  Program to Reverse Diabetes  Exercise recommendations are:  If unable to walk, then the patient can exercise in a chair 3 times a day. By flapping arms like a bird gently and raising legs outwards to the front.  If ambulatory, the patient can go for walks for 30 minutes 3 times a week. Then increase the intensity and duration as tolerated.  Goal is to try to attain exercise frequency to 5 times a week.  If applicable: Best to perform resistance exercises (machines or weights) 2 days a week and cardio type exercises 3 days per week.  

## 2012-12-26 NOTE — Progress Notes (Signed)
Patient ID: AMUN STEMM, male   DOB: 08-26-1945, 67 y.o.   MRN: [id number] SUBJECTIVE: CC: Chief Complaint  Patient presents with  . Follow-up    pt happy --cont. to diet   refill metformin    HPI: Patient is here for follow up of Diabetes Mellitus: Symptoms of DM: Denies Nocturia ,Denies Urinary Frequency , denies Blurred vision ,deniesDizziness,denies.Dysuria,denies paresthesias, denies extremity pain or ulcers.Matthew May chest pain. has had an annual eye exam. do check the feet. Does check CBGs. Average CBG:144 this am Denies episodes of hypoglycemia. Does have an emergency hypoglycemic plan. admits toCompliance with medications. Denies Problems with medications. Pleased with his progress  Past Medical History  Diagnosis Date  . HTN (hypertension)   . CAD (coronary artery disease)     a. s/p NSTEMI 4/11 => s/p CABG (L-LAD, S-OM2, S-PDA/PL);  b.  ETT-Myoview 6/14:  normal study, no ischemia, EF 67%  . Renal insufficiency   . COPD (chronic obstructive pulmonary disease)   . Obesity   . HLD (hyperlipidemia)   . Leg edema   . Hx of echocardiogram     Echo 6/14: Mod LVH, focal basal hypertrophy, EF 50-55%, mild AS (mean 17 mmHg) and mild AI, mild to mod LAE, mild RAE  . Carotid stenosis     Carotid U/S 6/14:  RICA 1-39%, LICA 40-59%; right vertebral flow retrograde suggestive of steel-consider PV consult  . Subclavian artery stenosis, right     based upon carotid U/S done 11/2012   Past Surgical History  Procedure Laterality Date  . Hernia repair    . Tonsillectomy    . Coronary artery bypass graft      2011, LIMA to LAD coronary artery, SVG to OM2 branch of lect circumflex coronary artery, and a sequential SVG to psot descening to posterolateral branches to RCA  . Endoscopic vein harvesting      right leg    History   Social History  . Marital Status: Married    Spouse Name: N/A    Number of Children: N/A  . Years of Education: N/A   Occupational History  . Not  on file.   Social History Main Topics  . Smoking status: Former Smoker    Quit date: 07/11/2009  . Smokeless tobacco: Not on file     Comment: smokes about 10 cig/day; used to smoke 2 ppd for many years   . Alcohol Use: No  . Drug Use: No  . Sexually Active: Not on file   Other Topics Concern  . Not on file   Social History Narrative   Lives with family   No family history on file. Current Outpatient Prescriptions on File Prior to Visit  Medication Sig Dispense Refill  . aspirin 325 MG tablet Take 325 mg by mouth daily.        Matthew Kitchen KLOR-CON M20 20 MEQ tablet TAKE ONE TABLET BY MOUTH ONCE DAILY  30 tablet  6  . levothyroxine (SYNTHROID, LEVOTHROID) 88 MCG tablet Take 1 tablet (88 mcg total) by mouth daily.  30 tablet  3  . metFORMIN (GLUCOPHAGE) 1000 MG tablet TAKE ONE TABLET BY MOUTH TWICE DAILY WITH MEALS  60 tablet  1  . nitroGLYCERIN (NITROSTAT) 0.4 MG SL tablet Place 1 tablet (0.4 mg total) under the tongue every 5 (five) minutes as needed for chest pain.  25 tablet  6  . simvastatin (ZOCOR) 40 MG tablet Take 1 tablet (40 mg total) by mouth daily.  30 tablet  6  .  torsemide (DEMADEX) 20 MG tablet TAKE TWO TABLETS BY MOUTH ONCE DAILY  180 tablet  0  . Vitamin D, Ergocalciferol, (DRISDOL) 50000 UNITS CAPS Take 50,000 Units by mouth every 7 (seven) days.       No current facility-administered medications on file prior to visit.   No Known Allergies  There is no immunization history on file for this patient. Prior to Admission medications   Medication Sig Start Date End Date Taking? Authorizing Provider  aspirin 325 MG tablet Take 325 mg by mouth daily.     Yes Historical Provider, MD  KLOR-CON M20 20 MEQ tablet TAKE ONE TABLET BY MOUTH ONCE DAILY 08/12/12  Yes Rollene Rotunda, MD  levothyroxine (SYNTHROID, LEVOTHROID) 88 MCG tablet Take 1 tablet (88 mcg total) by mouth daily. 11/13/12  Yes Ileana Ladd, MD  metFORMIN (GLUCOPHAGE) 1000 MG tablet TAKE ONE TABLET BY MOUTH TWICE DAILY  WITH MEALS 12/02/12  Yes Ernestina Penna, MD  nitroGLYCERIN (NITROSTAT) 0.4 MG SL tablet Place 1 tablet (0.4 mg total) under the tongue every 5 (five) minutes as needed for chest pain. 07/06/12 07/06/13 Yes Gerhard Munch, MD  simvastatin (ZOCOR) 40 MG tablet Take 1 tablet (40 mg total) by mouth daily. 07/31/12  Yes Rollene Rotunda, MD  torsemide (DEMADEX) 20 MG tablet TAKE TWO TABLETS BY MOUTH ONCE DAILY 11/12/12  Yes Ileana Ladd, MD  Vitamin D, Ergocalciferol, (DRISDOL) 50000 UNITS CAPS Take 50,000 Units by mouth every 7 (seven) days. 08/06/12  Yes Historical Provider, MD    ROS: As above in the HPI. All other systems are stable or negative.  OBJECTIVE: APPEARANCE:  Patient in no acute distress.The patient appeared well nourished and normally developed. Acyanotic. Waist: VITAL SIGNS:BP 140/70  Pulse 61  Temp(Src) 97 F (36.1 C) (Oral)  Wt 224 lb 9.6 oz (101.878 kg)  BMI 35.17 kg/m2 Obese WM Health status and appearance improving with weight loss  SKIN: warm and  Dry without overt rashes, tattoos and scars  HEAD and Neck: without JVD, Head and scalp: normal Eyes:No scleral icterus. Fundi normal, eye movements normal. Ears: Auricle normal, canal normal, Tympanic membranes normal, insufflation normal. Nose: normal Throat: normal Neck & thyroid: normal  CHEST & LUNGS: Chest wall: normal Lungs: Clear  CVS: Reveals the PMI to be normally located. Regular rhythm, First and Second Heart sounds are normal,  absence of murmurs, rubs or gallops. Peripheral vasculature: Radial pulses: normal Dorsal pedis pulses: normal  ABdomen Appearance: Obese Benign, no organomegaly, no masses, no Abdominal Aortic enlargement. No Guarding , no rebound. No Bruits. Bowel sounds: normal  RECTAL: N/A GU: N/A  EXTREMETIES: nonedematous. Both Femoral and Pedal pulses are normal.  MUSCULOSKELETAL:  Spine: normal Joints: intact  NEUROLOGIC: oriented to time,place and person;  nonfocal. Strength is normal Sensory is normal Reflexes are normal Cranial Nerves are normal.  Results for orders placed in visit on 10/29/12  BASIC METABOLIC PANEL      Result Value Range   Sodium 136  135 - 145 mEq/L   Potassium 3.7  3.5 - 5.1 mEq/L   Chloride 97  96 - 112 mEq/L   CO2 31  19 - 32 mEq/L   Glucose, Bld 99  70 - 99 mg/dL   BUN 14  6 - 23 mg/dL   Creatinine, Ser 1.0  0.4 - 1.5 mg/dL   Calcium 8.7  8.4 - 19.1 mg/dL   GFR 47.82  >95.62 mL/min  CBC WITH DIFFERENTIAL      Result Value  Range   WBC 8.8  4.5 - 10.5 K/uL   RBC 4.70  4.22 - 5.81 Mil/uL   Hemoglobin 13.9  13.0 - 17.0 g/dL   HCT 62.1  30.8 - 65.7 %   MCV 87.5  78.0 - 100.0 fl   MCHC 33.7  30.0 - 36.0 g/dL   RDW 84.6  96.2 - 95.2 %   Platelets 162.0  150.0 - 400.0 K/uL   Neutrophils Relative % 67.7  43.0 - 77.0 %   Lymphocytes Relative 23.3  12.0 - 46.0 %   Monocytes Relative 5.7  3.0 - 12.0 %   Eosinophils Relative 3.1  0.0 - 5.0 %   Basophils Relative 0.2  0.0 - 3.0 %   Neutro Abs 6.0  1.4 - 7.7 K/uL   Lymphs Abs 2.1  0.7 - 4.0 K/uL   Monocytes Absolute 0.5  0.1 - 1.0 K/uL   Eosinophils Absolute 0.3  0.0 - 0.7 K/uL   Basophils Absolute 0.0  0.0 - 0.1 K/uL  TSH      Result Value Range   TSH 11.12 (*) 0.35 - 5.50 uIU/mL    ASSESSMENT: DYSLIPIDEMIA - Plan: COMPLETE METABOLIC PANEL WITH GFR, NMR Lipoprofile with Lipids  Unspecified vitamin D deficiency - Plan: Vitamin D 25 hydroxy  Type II or unspecified type diabetes mellitus with peripheral circulatory disorders, uncontrolled(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 COMPLETE METABOLIC PANEL WITH GFR  . NMR Lipoprofile  with Lipids  . Vitamin D 25 hydroxy  . TSH  . POCT glycosylated hemoglobin (Hb A1C)  . POCT UA - Microalbumin   Meds ordered this encounter  Medications  . metFORMIN (GLUCOPHAGE) 1000 MG tablet    Sig: TAKE ONE TABLET BY MOUTH TWICE DAILY WITH MEALS    Dispense:  60 tablet    Refill:  11        Dr Woodroe Mode Recommendations  Diet and Exercise discussed with patient.  For nutrition information, I recommend books:  1).Eat to Live by Dr Monico Hoar. 2).Prevent and Reverse Heart Disease by Dr Suzzette Righter. 3) Dr Katherina Right Book:  Program to Reverse Diabetes  Exercise recommendations are:  If unable to walk, then the patient can exercise in a chair 3 times a day. By flapping arms like a bird gently and raising legs outwards to the front.  If ambulatory, the patient can go for walks for 30 minutes 3 times a week. Then increase the intensity and duration as tolerated.  Goal is to try to attain exercise frequency to 5 times a week.  If applicable: Best to perform resistance exercises (machines or weights) 2 days a week and cardio type exercises 3 days per week.   Praised patient on his  Successes and he needs to continue with his  Successful program of dietary changes and exercise.  Return in about 3 months (around 03/28/2013) for Recheck medical problems.  Javayah Magaw P. [place] Charon, M.D.

## 2012-12-27 LAB — COMPLETE METABOLIC PANEL WITH GFR
ALT: 23 U/L (ref 0–53)
AST: 23 U/L (ref 0–37)
Albumin: 4.2 g/dL (ref 3.5–5.2)
Alkaline Phosphatase: 62 U/L (ref 39–117)
BUN: 16 mg/dL (ref 6–23)
CO2: 25 mEq/L (ref 19–32)
Calcium: 9.1 mg/dL (ref 8.4–10.5)
Chloride: 102 mEq/L (ref 96–112)
Creat: 0.84 mg/dL (ref 0.50–1.35)
GFR, Est African American: >89 mL/min
GFR, Est Non African American: >89 mL/min
Glucose, Bld: 98 mg/dL (ref 70–99)
Potassium: 4.2 mEq/L (ref 3.5–5.3)
Sodium: 139 mEq/L (ref 135–145)
Total Bilirubin: 0.4 mg/dL (ref 0.3–1.2)
Total Protein: 7.2 g/dL (ref 6.0–8.3)

## 2012-12-27 LAB — VITAMIN D 25 HYDROXY (VIT D DEFICIENCY, FRACTURES): Vit D, 25-Hydroxy: 56 ng/mL (ref 30–89)

## 2012-12-27 LAB — TSH: TSH: 5.026 u[IU]/mL — ABNORMAL HIGH (ref 0.350–4.500)

## 2012-12-30 LAB — NMR LIPOPROFILE WITH LIPIDS
Cholesterol, Total: 138 mg/dL (ref <200)
HDL Particle Number: 25.3 umol/L — ABNORMAL LOW (ref >=30.5)
HDL Size: 8.6 nm — ABNORMAL LOW (ref >=9.2)
HDL-C: 33 mg/dL — ABNORMAL LOW (ref >=40)
LDL (calc): 83 mg/dL (ref <100)
LDL Particle Number: 1272 nmol/L — ABNORMAL HIGH (ref <1000)
LDL Size: 20.4 nm — ABNORMAL LOW (ref >20.5)
LP-IR Score: 71 — ABNORMAL HIGH (ref <=45)
Large HDL-P: <1.3 umol/L — ABNORMAL LOW (ref >=4.8)
Large VLDL-P: 2.6 nmol/L (ref <=2.7)
Small LDL Particle Number: 723 nmol/L — ABNORMAL HIGH (ref <=527)
Triglycerides: 108 mg/dL (ref <150)
VLDL Size: 47.3 nm — ABNORMAL HIGH (ref <=46.6)

## 2012-12-31 ENCOUNTER — Other Ambulatory Visit: Payer: Self-pay | Admitting: Family Medicine

## 2012-12-31 DIAGNOSIS — E039 Hypothyroidism, unspecified: Secondary | ICD-10-CM

## 2012-12-31 MED ORDER — LEVOTHYROXINE SODIUM 100 MCG PO TABS: 100.0000 ug | ORAL_TABLET | Freq: Every day | ORAL | Status: DC

## 2012-12-31 NOTE — Progress Notes (Signed)
Quick Note:  Labs abnormal.Thyroid still not at goal although better. This would affect the lipids which are close to goal. Sugar is good and at goal. So I will just increase the thyroid medication and recheck in 6 weeks the thyroid. Orders placed in EPIC.  ______

## 2013-01-14 ENCOUNTER — Other Ambulatory Visit: Payer: Self-pay | Admitting: Family Medicine

## 2013-01-21 ENCOUNTER — Telehealth: Payer: Self-pay | Admitting: Family Medicine

## 2013-01-21 NOTE — Telephone Encounter (Signed)
MED CALLED IN 01/15/13

## 2013-01-22 NOTE — Telephone Encounter (Signed)
Spoke with pt called to report he took last vit d on Tuesday. Per last labs vit d level was 56 and advised pt since appt next week would address at office visit. Pt verbalized understanding and no further questions.

## 2013-01-29 ENCOUNTER — Ambulatory Visit: Payer: Medicare Other | Admitting: Family Medicine

## 2013-02-20 ENCOUNTER — Ambulatory Visit (INDEPENDENT_AMBULATORY_CARE_PROVIDER_SITE_OTHER): Payer: Medicare Other | Admitting: Family Medicine

## 2013-02-20 ENCOUNTER — Encounter: Payer: Self-pay | Admitting: Family Medicine

## 2013-02-20 VITALS — BP 127/69 | HR 62 | Temp 97.1°F | Ht 67.1 in | Wt 221.2 lb

## 2013-02-20 DIAGNOSIS — E1159 Type 2 diabetes mellitus with other circulatory complications: Secondary | ICD-10-CM

## 2013-02-20 DIAGNOSIS — E559 Vitamin D deficiency, unspecified: Secondary | ICD-10-CM

## 2013-02-20 DIAGNOSIS — R229 Localized swelling, mass and lump, unspecified: Secondary | ICD-10-CM

## 2013-02-20 DIAGNOSIS — E785 Hyperlipidemia, unspecified: Secondary | ICD-10-CM

## 2013-02-20 DIAGNOSIS — E039 Hypothyroidism, unspecified: Secondary | ICD-10-CM

## 2013-02-20 DIAGNOSIS — I10 Essential (primary) hypertension: Secondary | ICD-10-CM

## 2013-02-20 DIAGNOSIS — I2510 Atherosclerotic heart disease of native coronary artery without angina pectoris: Secondary | ICD-10-CM

## 2013-02-20 LAB — POCT GLYCOSYLATED HEMOGLOBIN (HGB A1C): Hemoglobin A1C: 5.9

## 2013-02-20 NOTE — Progress Notes (Signed)
Patient ID: Matthew May, male   DOB: Feb 29, 1946, 67 y.o.   MRN: [id number] SUBJECTIVE: CC: Chief Complaint  Patient presents with  . Follow-up    FASTING concerned about legs swelling  . Medication Refill    refill strips and lancets, simvatatin, potassium,toresmide.  refill NTG    HPI: Patient is here for follow up of Diabetes Mellitus: Symptoms evaluated: Denies Nocturia ,Denies Urinary Frequency , denies Blurred vision ,deniesDizziness,denies.Dysuria,denies paresthesias, denies extremity pain or ulcers.Marland Kitchendenies chest pain. has had an annual eye exam. do check the feet. Does check CBGs. Average CBG:117 Denies episodes of hypoglycemia. Does have an emergency hypoglycemic plan. admits toCompliance with medications. Denies Problems with medications.  Feels  So much better since having his meds adjusted.  Here to recheck the thyroid as well.   Past Medical History  Diagnosis Date  . HTN (hypertension)   . CAD (coronary artery disease)     a. s/p NSTEMI 4/11 => s/p CABG (L-LAD, S-OM2, S-PDA/PL);  b.  ETT-Myoview 6/14:  normal study, no ischemia, EF 67%  . Renal insufficiency   . COPD (chronic obstructive pulmonary disease)   . Obesity   . HLD (hyperlipidemia)   . Leg edema   . Hx of echocardiogram     Echo 6/14: Mod LVH, focal basal hypertrophy, EF 50-55%, mild AS (mean 17 mmHg) and mild AI, mild to mod LAE, mild RAE  . Carotid stenosis     Carotid U/S 6/14:  RICA 1-39%, LICA 40-59%; right vertebral flow retrograde suggestive of steel-consider PV consult  . Subclavian artery stenosis, right     based upon carotid U/S done 11/2012   Past Surgical History  Procedure Laterality Date  . Hernia repair    . Tonsillectomy    . Coronary artery bypass graft      2011, LIMA to LAD coronary artery, SVG to OM2 branch of lect circumflex coronary artery, and a sequential SVG to psot descening to posterolateral branches to RCA  . Endoscopic vein harvesting      right leg     History   Social History  . Marital Status: Married    Spouse Name: N/A    Number of Children: N/A  . Years of Education: N/A   Occupational History  . Not on file.   Social History Main Topics  . Smoking status: Former Smoker    Quit date: 07/11/2009  . Smokeless tobacco: Not on file     Comment: smokes about 10 cig/day; used to smoke 2 ppd for many years   . Alcohol Use: No  . Drug Use: No  . Sexual Activity: Not on file   Other Topics Concern  . Not on file   Social History Narrative   Lives with family   No family history on file. Current Outpatient Prescriptions on File Prior to Visit  Medication Sig Dispense Refill  . aspirin 325 MG tablet Take 325 mg by mouth daily.        Marland Kitchen KLOR-CON M20 20 MEQ tablet TAKE ONE TABLET BY MOUTH ONCE DAILY  30 tablet  6  . levothyroxine (SYNTHROID, LEVOTHROID) 100 MCG tablet Take 1 tablet (100 mcg total) by mouth daily.  30 tablet  3  . metFORMIN (GLUCOPHAGE) 1000 MG tablet TAKE ONE TABLET BY MOUTH TWICE DAILY WITH MEALS  60 tablet  11  . nitroGLYCERIN (NITROSTAT) 0.4 MG SL tablet Place 1 tablet (0.4 mg total) under the tongue every 5 (five) minutes as needed for chest pain.  25 tablet  6  . simvastatin (ZOCOR) 40 MG tablet Take 1 tablet (40 mg total) by mouth daily.  30 tablet  6  . torsemide (DEMADEX) 20 MG tablet TAKE TWO TABLETS BY MOUTH ONCE DAILY  180 tablet  0  . Vitamin D, Ergocalciferol, (DRISDOL) 50000 UNITS CAPS capsule TAKE ONE CAPSULE BY MOUTH ONCE A WEEK  12 capsule  0   No current facility-administered medications on file prior to visit.   No Known Allergies  There is no immunization history on file for this patient. Prior to Admission medications   Medication Sig Start Date End Date Taking? Authorizing Provider  aspirin 325 MG tablet Take 325 mg by mouth daily.     Yes Historical Provider, MD  KLOR-CON M20 20 MEQ tablet TAKE ONE TABLET BY MOUTH ONCE DAILY 08/12/12  Yes Rollene Rotunda, MD  levothyroxine (SYNTHROID,  LEVOTHROID) 100 MCG tablet Take 1 tablet (100 mcg total) by mouth daily. 12/31/12  Yes Ileana Ladd, MD  metFORMIN (GLUCOPHAGE) 1000 MG tablet TAKE ONE TABLET BY MOUTH TWICE DAILY WITH MEALS 12/26/12  Yes Ileana Ladd, MD  nitroGLYCERIN (NITROSTAT) 0.4 MG SL tablet Place 1 tablet (0.4 mg total) under the tongue every 5 (five) minutes as needed for chest pain. 07/06/12 07/06/13 Yes Gerhard Munch, MD  simvastatin (ZOCOR) 40 MG tablet Take 1 tablet (40 mg total) by mouth daily. 07/31/12  Yes Rollene Rotunda, MD  torsemide (DEMADEX) 20 MG tablet TAKE TWO TABLETS BY MOUTH ONCE DAILY 11/12/12  Yes Ileana Ladd, MD  Vitamin D, Ergocalciferol, (DRISDOL) 50000 UNITS CAPS capsule TAKE ONE CAPSULE BY MOUTH ONCE A WEEK 01/14/13  Yes Ernestina Penna, MD    ROS: As above in the HPI. All other systems are stable or negative.  OBJECTIVE: APPEARANCE:  Patient in no acute distress.The patient appeared well nourished and normally developed. Acyanotic. Waist: VITAL SIGNS:BP 127/69  Pulse 62  Temp(Src) 97.1 F (36.2 C) (Oral)  Ht 5' 7.1" (1.704 m)  Wt 221 lb 3.2 oz (100.336 kg)  BMI 34.56 kg/m2 WM central obesity. Looks well today.  SKIN: warm and  Dry without overt rashes, tattoos and scars  HEAD and Neck: without JVD, Head and scalp: normal Eyes:No scleral icterus. Fundi normal, eye movements normal. Ears: Auricle normal, canal normal, Tympanic membranes normal, insufflation normal. Nose: normal Throat: normal Neck & thyroid: normal  CHEST & LUNGS: Chest wall: normal Lungs: Clear  CVS: Reveals the PMI to be normally located. Regular rhythm, First and Second Heart sounds are normal,  absence of murmurs, rubs or gallops. Peripheral vasculature: Radial pulses: normal Dorsal pedis pulses: normal Posterior pulses: normal  ABDOMEN:  Appearance: Obese Benign, no organomegaly, no masses, no Abdominal Aortic enlargement. No Guarding , no rebound. No Bruits. Bowel sounds: normal  RECTAL:  N/A GU: N/A  EXTREMETIES: nonedematous.  MUSCULOSKELETAL:  Spine: normal Joints: intact  NEUROLOGIC: oriented to time,place and person; nonfocal. Strength is normal Sensory is normal Reflexes are normal Cranial Nerves are normal. Results for orders placed in visit on 12/26/12  COMPLETE METABOLIC PANEL WITH GFR      Result Value Range   Sodium 139  135 - 145 mEq/L   Potassium 4.2  3.5 - 5.3 mEq/L   Chloride 102  96 - 112 mEq/L   CO2 25  19 - 32 mEq/L   Glucose, Bld 98  70 - 99 mg/dL   BUN 16  6 - 23 mg/dL   Creat 1.61  0.96 - 0.45  mg/dL   Total Bilirubin 0.4  0.3 - 1.2 mg/dL   Alkaline Phosphatase 62  39 - 117 U/L   AST 23  0 - 37 U/L   ALT 23  0 - 53 U/L   Total Protein 7.2  6.0 - 8.3 g/dL   Albumin 4.2  3.5 - 5.2 g/dL   Calcium 9.1  8.4 - 16.1 mg/dL   GFR, Est African American >89     GFR, Est Non African American >89    NMR LIPOPROFILE WITH LIPIDS      Result Value Range   LDL Particle Number [id number] (*) <1000 nmol/L   LDL (calc) 83  <100 mg/dL   HDL-C 33 (*) >=09 mg/dL   Triglycerides 604  <540 mg/dL   Cholesterol, Total 981  <200 mg/dL   HDL Particle Number 19.1 (*) >=30.5 umol/L   Large HDL-P <1.3 (*) >=4.8 umol/L   Large VLDL-P 2.6  <=2.7 nmol/L   Small LDL Particle Number 723 (*) <=527 nmol/L   LDL Size 20.4 (*) >20.5 nm   HDL Size 8.6 (*) >=9.2 nm   VLDL Size 47.3 (*) <=46.6 nm   LP-IR Score 71 (*) <=45  VITAMIN D 25 HYDROXY      Result Value Range   Vit D, 25-Hydroxy 56  30 - 89 ng/mL  TSH      Result Value Range   TSH 5.026 (*) 0.350 - 4.500 uIU/mL  POCT GLYCOSYLATED HEMOGLOBIN (HGB A1C)      Result Value Range   Hemoglobin A1C 6.0%      ASSESSMENT: DYSLIPIDEMIA  CORONARY ATHEROSCLEROSIS NATIVE CORONARY ARTERY  HYPERTENSION - Plan: CMP14+EGFR  Hypothyroidism - Plan: TSH  LOCALIZED SUPERFICIAL SWELLING MASS OR LUMP  Type II or unspecified type diabetes mellitus with peripheral circulatory disorders, uncontrolled(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 CMP14+EGFR  . TSH  . Vit D  25 hydroxy (rtn osteoporosis monitoring)  . POCT glycosylated hemoglobin (Hb A1C)    Diet and exercise.  Await labs.  Return in about 2 months (around 04/22/2013) for Recheck medical problems.  Kylyn Mcdade P. [place] Charon, M.D.

## 2013-02-21 LAB — CMP14+EGFR
ALT: 21 IU/L (ref 0–44)
AST: 23 IU/L (ref 0–40)
Albumin/Globulin Ratio: 1.8 (ref 1.1–2.5)
Albumin: 4.4 g/dL (ref 3.6–4.8)
Alkaline Phosphatase: 61 IU/L (ref 39–117)
BUN/Creatinine Ratio: 15 (ref 10–22)
BUN: 13 mg/dL (ref 8–27)
CO2: 27 mmol/L (ref 18–29)
Calcium: 9 mg/dL (ref 8.6–10.2)
Chloride: 96 mmol/L — ABNORMAL LOW (ref 97–108)
Creatinine, Ser: 0.86 mg/dL (ref 0.76–1.27)
GFR calc Af Amer: 105 mL/min/{1.73_m2} (ref >59)
GFR calc non Af Amer: 91 mL/min/{1.73_m2} (ref >59)
Globulin, Total: 2.5 g/dL (ref 1.5–4.5)
Glucose: 98 mg/dL (ref 65–99)
Potassium: 4.3 mmol/L (ref 3.5–5.2)
Sodium: 137 mmol/L (ref 134–144)
Total Bilirubin: 0.6 mg/dL (ref 0.0–1.2)
Total Protein: 6.9 g/dL (ref 6.0–8.5)

## 2013-02-21 LAB — TSH: TSH: 2.67 u[IU]/mL (ref 0.450–4.500)

## 2013-02-21 LAB — VITAMIN D 25 HYDROXY (VIT D DEFICIENCY, FRACTURES): Vit D, 25-Hydroxy: 63.3 ng/mL (ref 30.0–100.0)

## 2013-02-22 ENCOUNTER — Other Ambulatory Visit: Payer: Self-pay | Admitting: Family Medicine

## 2013-02-22 DIAGNOSIS — R7303 Prediabetes: Secondary | ICD-10-CM | POA: Insufficient documentation

## 2013-02-24 ENCOUNTER — Other Ambulatory Visit: Payer: Self-pay | Admitting: Family Medicine

## 2013-02-24 ENCOUNTER — Telehealth: Payer: Self-pay | Admitting: *Deleted

## 2013-02-24 DIAGNOSIS — E785 Hyperlipidemia, unspecified: Secondary | ICD-10-CM

## 2013-02-24 MED ORDER — SIMVASTATIN 40 MG PO TABS: 40.0000 mg | ORAL_TABLET | Freq: Every day | ORAL | Status: DC

## 2013-02-24 MED ORDER — TORSEMIDE 20 MG PO TABS: ORAL_TABLET | ORAL | Status: DC

## 2013-02-24 MED ORDER — NITROGLYCERIN 0.4 MG SL SUBL: 0.4000 mg | SUBLINGUAL_TABLET | SUBLINGUAL | Status: DC | PRN

## 2013-02-24 NOTE — Telephone Encounter (Signed)
rx called in to [facility] and [facility] strips and lancets.   Pt aware.

## 2013-02-24 NOTE — Telephone Encounter (Signed)
Patient needs Rx for strips and lancets.  His machine is a One Touch Ultra Mini (50 strips).  He said that he was told this was going to be done after visit the other day, but pharmacy doesn't have anything.

## 2013-02-24 NOTE — Telephone Encounter (Signed)
Rx ready for Phone in.please The other Rx were refilled inEPIC.

## 2013-03-12 ENCOUNTER — Other Ambulatory Visit: Payer: Self-pay

## 2013-03-12 MED ORDER — POTASSIUM CHLORIDE CRYS ER 20 MEQ PO TBCR: EXTENDED_RELEASE_TABLET | ORAL | Status: DC

## 2013-04-01 ENCOUNTER — Ambulatory Visit: Payer: Medicare Other | Admitting: Family Medicine

## 2013-04-29 ENCOUNTER — Other Ambulatory Visit: Payer: Self-pay | Admitting: Family Medicine

## 2013-05-07 ENCOUNTER — Telehealth: Payer: Self-pay | Admitting: Family Medicine

## 2013-05-08 ENCOUNTER — Other Ambulatory Visit: Payer: Self-pay | Admitting: Family Medicine

## 2013-05-08 MED ORDER — LEVOTHYROXINE SODIUM 100 MCG PO TABS: ORAL_TABLET | ORAL | Status: DC

## 2013-05-08 NOTE — Telephone Encounter (Signed)
Call patient : Prescription refilled & sent to pharmacy in [facility]. 

## 2013-05-08 NOTE — Telephone Encounter (Signed)
Pt notified rx called to [facility] for levothyroxine

## 2013-05-23 ENCOUNTER — Ambulatory Visit (INDEPENDENT_AMBULATORY_CARE_PROVIDER_SITE_OTHER): Payer: Medicare Other

## 2013-05-23 ENCOUNTER — Ambulatory Visit (INDEPENDENT_AMBULATORY_CARE_PROVIDER_SITE_OTHER): Payer: Medicare Other | Admitting: Family Medicine

## 2013-05-23 ENCOUNTER — Encounter: Payer: Self-pay | Admitting: Family Medicine

## 2013-05-23 ENCOUNTER — Encounter (INDEPENDENT_AMBULATORY_CARE_PROVIDER_SITE_OTHER): Payer: Self-pay

## 2013-05-23 VITALS — BP 138/71 | HR 72 | Temp 97.0°F | Ht 67.0 in | Wt 222.6 lb

## 2013-05-23 DIAGNOSIS — E559 Vitamin D deficiency, unspecified: Secondary | ICD-10-CM

## 2013-05-23 DIAGNOSIS — I771 Stricture of artery: Secondary | ICD-10-CM

## 2013-05-23 DIAGNOSIS — I2510 Atherosclerotic heart disease of native coronary artery without angina pectoris: Secondary | ICD-10-CM

## 2013-05-23 DIAGNOSIS — I708 Atherosclerosis of other arteries: Secondary | ICD-10-CM

## 2013-05-23 DIAGNOSIS — R0789 Other chest pain: Secondary | ICD-10-CM | POA: Insufficient documentation

## 2013-05-23 DIAGNOSIS — R0602 Shortness of breath: Secondary | ICD-10-CM

## 2013-05-23 DIAGNOSIS — E785 Hyperlipidemia, unspecified: Secondary | ICD-10-CM

## 2013-05-23 DIAGNOSIS — I498 Other specified cardiac arrhythmias: Secondary | ICD-10-CM

## 2013-05-23 DIAGNOSIS — E039 Hypothyroidism, unspecified: Secondary | ICD-10-CM

## 2013-05-23 DIAGNOSIS — E119 Type 2 diabetes mellitus without complications: Secondary | ICD-10-CM

## 2013-05-23 DIAGNOSIS — R008 Other abnormalities of heart beat: Secondary | ICD-10-CM | POA: Insufficient documentation

## 2013-05-23 DIAGNOSIS — I10 Essential (primary) hypertension: Secondary | ICD-10-CM

## 2013-05-23 DIAGNOSIS — E1159 Type 2 diabetes mellitus with other circulatory complications: Secondary | ICD-10-CM

## 2013-05-23 DIAGNOSIS — R609 Edema, unspecified: Secondary | ICD-10-CM

## 2013-05-23 LAB — POCT GLYCOSYLATED HEMOGLOBIN (HGB A1C): Hemoglobin A1C: 6

## 2013-05-23 LAB — POCT UA - MICROALBUMIN: Microalbumin Ur, POC: 20 mg/L

## 2013-05-23 NOTE — Addendum Note (Signed)
Addended by: Prescott Gum on: 05/23/2013 03:21 PM   Modules accepted: Orders

## 2013-05-23 NOTE — Progress Notes (Addendum)
Patient ID: Matthew May, male   DOB: [birth year], 67 y.o.   MRN: [id number] SUBJECTIVE: CC: Chief Complaint  Patient presents with  . Hypertension  . Hyperlipidemia  . Diabetes  . Vit D deficiency    HPI: Patient is here for follow up of Diabetes Mellitus/HTN/HLD/Vit D Def: Symptoms evaluated: Denies Nocturia ,Denies Urinary Frequency , denies Blurred vision ,deniesDizziness,denies.Dysuria,denies paresthesias, denies extremity pain or ulcers. chest pain: stuffy feeling in the chest for the past week. Occasional thump felt. Always has had the thump. Had symptoms today earlier.  has had an annual eye exam. do check the feet. Does check CBGs. Average CBG:? Not checked regularly Denies episodes of hypoglycemia. Does have an emergency hypoglycemic plan. admits toCompliance with medications. Denies Problems with medications.  Past Medical History  Diagnosis Date  . HTN (hypertension)   . CAD (coronary artery disease)     a. s/p NSTEMI 4/11 => s/p CABG (L-LAD, S-OM2, S-PDA/PL);  b.  ETT-Myoview 6/14:  normal study, no ischemia, EF 67%  . Renal insufficiency   . COPD (chronic obstructive pulmonary disease)   . Obesity   . HLD (hyperlipidemia)   . Leg edema   . Hx of echocardiogram     Echo 6/14: Mod LVH, focal basal hypertrophy, EF 50-55%, mild AS (mean 17 mmHg) and mild AI, mild to mod LAE, mild RAE  . Carotid stenosis     Carotid U/S 6/14:  RICA 1-39%, LICA 40-59%; right vertebral flow retrograde suggestive of steel-consider PV consult  . Subclavian artery stenosis, right     based upon carotid U/S done 11/2012   Past Surgical History  Procedure Laterality Date  . Hernia repair    . Tonsillectomy    . Coronary artery bypass graft      2011, LIMA to LAD coronary artery, SVG to OM2 branch of lect circumflex coronary artery, and a sequential SVG to psot descening to posterolateral branches to RCA  . Endoscopic vein harvesting      right leg    History   Social History  .  Marital Status: Married    Spouse Name: N/A    Number of Children: N/A  . Years of Education: N/A   Occupational History  . Not on file.   Social History Main Topics  . Smoking status: Former Smoker    Quit date: 07/11/2009  . Smokeless tobacco: Not on file     Comment: smokes about 10 cig/day; used to smoke 2 ppd for many years   . Alcohol Use: No  . Drug Use: No  . Sexual Activity: Not on file   Other Topics Concern  . Not on file   Social History Narrative   Lives with family   History reviewed. No pertinent family history. Current Outpatient Prescriptions on File Prior to Visit  Medication Sig Dispense Refill  . aspirin 325 MG tablet Take 325 mg by mouth daily.        Marland Kitchen levothyroxine (SYNTHROID, LEVOTHROID) 100 MCG tablet TAKE ONE TABLET BY MOUTH ONCE DAILY  30 tablet  2  . metFORMIN (GLUCOPHAGE) 1000 MG tablet TAKE ONE TABLET BY MOUTH TWICE DAILY WITH MEALS  60 tablet  11  . nitroGLYCERIN (NITROSTAT) 0.4 MG SL tablet Place 1 tablet (0.4 mg total) under the tongue every 5 (five) minutes as needed for chest pain.  25 tablet  3  . potassium chloride SA (KLOR-CON M20) 20 MEQ tablet TAKE ONE TABLET BY MOUTH ONCE DAILY  30 tablet  6  .  simvastatin (ZOCOR) 40 MG tablet Take 1 tablet (40 mg total) by mouth daily.  30 tablet  6  . torsemide (DEMADEX) 20 MG tablet TAKE TWO TABLETS BY MOUTH ONCE DAILY  180 tablet  1  . Vitamin D, Ergocalciferol, (DRISDOL) 50000 UNITS CAPS capsule TAKE ONE CAPSULE BY MOUTH ONCE A WEEK  12 capsule  0   No current facility-administered medications on file prior to visit.   No Known Allergies  There is no immunization history on file for this patient. Prior to Admission medications   Medication Sig Start Date End Date Taking? Authorizing Provider  aspirin 325 MG tablet Take 325 mg by mouth daily.      Historical Provider, MD  levothyroxine (SYNTHROID, LEVOTHROID) 100 MCG tablet TAKE ONE TABLET BY MOUTH ONCE DAILY 05/08/13   Ileana Ladd, MD   metFORMIN (GLUCOPHAGE) 1000 MG tablet TAKE ONE TABLET BY MOUTH TWICE DAILY WITH MEALS 12/26/12   Ileana Ladd, MD  nitroGLYCERIN (NITROSTAT) 0.4 MG SL tablet Place 1 tablet (0.4 mg total) under the tongue every 5 (five) minutes as needed for chest pain. 02/24/13 02/24/14  Ileana Ladd, MD  potassium chloride SA (KLOR-CON M20) 20 MEQ tablet TAKE ONE TABLET BY MOUTH ONCE DAILY 03/12/13   Rollene Rotunda, MD  simvastatin (ZOCOR) 40 MG tablet Take 1 tablet (40 mg total) by mouth daily. 02/24/13   Ileana Ladd, MD  torsemide (DEMADEX) 20 MG tablet TAKE TWO TABLETS BY MOUTH ONCE DAILY 02/24/13   Ileana Ladd, MD  Vitamin D, Ergocalciferol, (DRISDOL) 50000 UNITS CAPS capsule TAKE ONE CAPSULE BY MOUTH ONCE A WEEK 01/14/13   Ernestina Penna, MD     ROS: As above in the HPI. All other systems are stable or negative.  OBJECTIVE: APPEARANCE:  Patient in no acute distress.The patient appeared well nourished and normally developed. Acyanotic. Waist: VITAL SIGNS:BP 138/71  Pulse 72  Temp(Src) 97 F (36.1 C) (Oral)  Ht 5\' 7"  (1.702 m)  Wt 222 lb 9.6 oz (100.971 kg)  BMI 34.86 kg/m2  SpO2 96%  Obese WM  SKIN: warm and  Dry without overt rashes, tattoos and scars  HEAD and Neck: without JVD, Head and scalp: normal Eyes:No scleral icterus. Fundi normal, eye movements normal. Ears: Auricle normal, canal normal, Tympanic membranes normal, insufflation normal. Nose: normal Throat: normal Neck & thyroid: normal  CHEST & LUNGS: Chest wall: normal Lungs: Clear  CVS: Reveals the PMI to be normally located. Regular rhythm, First and Second Heart sounds are normal,  absence of murmurs, rubs or gallops. Peripheral vasculature: Radial pulses: normal Dorsal pedis pulses: normal Posterior pulses: normal  ABDOMEN:  Appearance: Obese Benign, no organomegaly, no masses, no Abdominal Aortic enlargement. No Guarding , no rebound. No Bruits. Bowel sounds: normal  RECTAL: N/A GU:  N/A  EXTREMETIES: trace edema  NEUROLOGIC: oriented to time,place and person; nonfocal.  Results for orders placed in visit on 02/20/13  CMP14+EGFR      Result Value Range   Glucose 98  65 - 99 mg/dL   BUN 13  8 - 27 mg/dL   Creatinine, Ser 1.61  0.76 - 1.27 mg/dL   GFR calc non Af Amer 91  >59 mL/min/1.73   GFR calc Af Amer 105  >59 mL/min/1.73   BUN/Creatinine Ratio 15  10 - 22   Sodium 137  134 - 144 mmol/L   Potassium 4.3  3.5 - 5.2 mmol/L   Chloride 96 (*) 97 - 108 mmol/L  CO2 27  18 - 29 mmol/L   Calcium 9.0  8.6 - 10.2 mg/dL   Total Protein 6.9  6.0 - 8.5 g/dL   Albumin 4.4  3.6 - 4.8 g/dL   Globulin, Total 2.5  1.5 - 4.5 g/dL   Albumin/Globulin Ratio 1.8  1.1 - 2.5   Total Bilirubin 0.6  0.0 - 1.2 mg/dL   Alkaline Phosphatase 61  39 - 117 IU/L   AST 23  0 - 40 IU/L   ALT 21  0 - 44 IU/L  TSH      Result Value Range   TSH 2.670  0.450 - 4.500 uIU/mL  VITAMIN D 25 HYDROXY      Result Value Range   Vit D, 25-Hydroxy 63.3  30.0 - 100.0 ng/mL  POCT GLYCOSYLATED HEMOGLOBIN (HGB A1C)      Result Value Range   Hemoglobin A1C 5.9      ASSESSMENT: Atypical chest pain  Hypertension - Plan: CMP14+EGFR  Hypothyroid - Plan: TSH  Diabetes - Plan: POCT glycosylated hemoglobin (Hb A1C), POCT UA - Microalbumin, CMP14+EGFR  Unspecified vitamin D deficiency  Hyperlipemia - Plan: CMP14+EGFR, NMR, lipoprofile  Unspecified hypothyroidism  Edema  Hypothyroidism  Type II or unspecified type diabetes mellitus with peripheral circulatory disorders, uncontrolled(250.72)  Subclavian artery stenosis, right  HYPERTENSION  DYSLIPIDEMIA  CORONARY ATHEROSCLEROSIS NATIVE CORONARY ARTERY - Plan: Ambulatory referral to Cardiology  SOB (shortness of breath) - Plan: EKG 12-Lead, DG Chest 2 View, Ambulatory referral to Cardiology  Trigeminy  PLAN: [facility] reading (PRIMARY) by  Dr. Modesto May: stable cardiomegaly and basilar atelectasis. Postop changes.                                   Orders Placed This Encounter  Procedures  . DG Chest 2 View    Order Specific Question:  Reason for Exam (SYMPTOM  OR DIAGNOSIS REQUIRED)    Answer:  sob    Order Specific Question:  Preferred imaging location?    Answer:  Internal  . CMP14+EGFR  . NMR, lipoprofile  . TSH  . Ambulatory referral to Cardiology    Referral Priority:  Routine    Referral Type:  Consultation    Referral Reason:  Specialty Services Required    Requested Specialty:  Cardiology    Number of Visits Requested:  1  . POCT glycosylated hemoglobin (Hb A1C)  . POCT UA - Microalbumin  . EKG 12-Lead   EKG : trigeminy                               Same medications. Meds ordered this encounter  Medications  . ONE TOUCH ULTRA TEST test strip    Sig:   . FLUZONE HIGH-DOSE injection    Sig:   . ONETOUCH DELICA LANCETS 33G MISC    Sig:    There are no discontinued medications. Return in about 3 months (around 08/21/2013), or Cardiology eavaluation today, for Recheck medical problems. In 3 months.  Matthew May, M.D.    Addendum: discussed with Dr Patty Sermons. Check labs for electrolyte disturbance. Nothing acute on EKG, only the Trigeminy. Ensure he has NTG. Call 911 if symptoms return or any other symptoms. Will expedite an early cardiac follow up with Dr Antoine Poche. Patient informed.  Matthew May, M.D.

## 2013-05-24 LAB — MICROALBUMIN, URINE: Microalbumin, Urine: 3.5 ug/mL (ref 0.0–17.0)

## 2013-05-25 ENCOUNTER — Other Ambulatory Visit: Payer: Self-pay | Admitting: Family Medicine

## 2013-05-25 DIAGNOSIS — E039 Hypothyroidism, unspecified: Secondary | ICD-10-CM

## 2013-05-25 LAB — CMP14+EGFR
ALT: 18 IU/L (ref 0–44)
AST: 20 IU/L (ref 0–40)
Albumin/Globulin Ratio: 1.6 (ref 1.1–2.5)
Albumin: 4.5 g/dL (ref 3.6–4.8)
Alkaline Phosphatase: 74 IU/L (ref 39–117)
BUN/Creatinine Ratio: 17 (ref 10–22)
BUN: 17 mg/dL (ref 8–27)
CO2: 29 mmol/L (ref 18–29)
Calcium: 9.3 mg/dL (ref 8.6–10.2)
Chloride: 96 mmol/L — ABNORMAL LOW (ref 97–108)
Creatinine, Ser: 1.01 mg/dL (ref 0.76–1.27)
GFR calc Af Amer: 90 mL/min/{1.73_m2} (ref >59)
GFR calc non Af Amer: 78 mL/min/{1.73_m2} (ref >59)
Globulin, Total: 2.8 g/dL (ref 1.5–4.5)
Glucose: 127 mg/dL — ABNORMAL HIGH (ref 65–99)
Potassium: 4.6 mmol/L (ref 3.5–5.2)
Sodium: 141 mmol/L (ref 134–144)
Total Bilirubin: 0.4 mg/dL (ref 0.0–1.2)
Total Protein: 7.3 g/dL (ref 6.0–8.5)

## 2013-05-25 LAB — NMR, LIPOPROFILE
Cholesterol: 133 mg/dL (ref <200)
HDL Cholesterol by NMR: 34 mg/dL — ABNORMAL LOW (ref >=40)
HDL Particle Number: 28.4 umol/L — ABNORMAL LOW (ref >=30.5)
LDL Particle Number: 1432 nmol/L — ABNORMAL HIGH (ref <1000)
LDL Size: 19.8 nm — ABNORMAL LOW (ref >20.5)
LDLC SERPL CALC-MCNC: 81 mg/dL (ref <100)
LP-IR Score: 60 — ABNORMAL HIGH (ref <=45)
Small LDL Particle Number: 1057 nmol/L — ABNORMAL HIGH (ref <=527)
Triglycerides by NMR: 90 mg/dL (ref <150)

## 2013-05-25 LAB — TSH: TSH: 5.66 u[IU]/mL — ABNORMAL HIGH (ref 0.450–4.500)

## 2013-05-25 MED ORDER — LEVOTHYROXINE SODIUM 112 MCG PO TABS: ORAL_TABLET | ORAL | Status: DC

## 2013-06-25 ENCOUNTER — Encounter: Payer: Self-pay | Admitting: Cardiology

## 2013-06-25 ENCOUNTER — Ambulatory Visit (INDEPENDENT_AMBULATORY_CARE_PROVIDER_SITE_OTHER): Payer: Medicare Other | Admitting: Cardiology

## 2013-06-25 VITALS — BP 140/76 | HR 62 | Ht 67.0 in | Wt 228.0 lb

## 2013-06-25 DIAGNOSIS — I2510 Atherosclerotic heart disease of native coronary artery without angina pectoris: Secondary | ICD-10-CM

## 2013-06-25 NOTE — Patient Instructions (Signed)
The current medical regimen is effective;  continue present plan and medications.  Follow up in 1 year with Dr Hochrein.  You will receive a letter in the mail 2 months before you are due.  Please call us when you receive this letter to schedule your follow up appointment.  

## 2013-06-25 NOTE — Progress Notes (Signed)
HPI The patient returns for followup of his known coronary disease.  He has had an extensive workup in past year with a nuclear study an echo. He did have some symptoms of some shortness of breath and any palpitations.  I reviewed some records from mid December. Chest x-ray was unremarkable.  He does have PVCs on his EKG but it seemed his in the past. He actually thinks his getting along relatively well now. He denies any new chest pressure, neck or arm discomfort. He's not really noticing any palpitations, presyncope or syncope. He's had no PND or orthopnea. He's had no weight gain or edema.  No Known Allergies  Current Outpatient Prescriptions  Medication Sig Dispense Refill  . aspirin 325 MG tablet Take 325 mg by mouth daily.        Marland Kitchen FLUZONE HIGH-DOSE injection       . levothyroxine (SYNTHROID, LEVOTHROID) 112 MCG tablet TAKE ONE TABLET BY MOUTH ONCE DAILY  30 tablet  2  . metFORMIN (GLUCOPHAGE) 1000 MG tablet TAKE ONE TABLET BY MOUTH TWICE DAILY WITH MEALS  60 tablet  11  . nitroGLYCERIN (NITROSTAT) 0.4 MG SL tablet Place 1 tablet (0.4 mg total) under the tongue every 5 (five) minutes as needed for chest pain.  25 tablet  3  . ONE TOUCH ULTRA TEST test strip       . ONETOUCH DELICA LANCETS 52W MISC       . potassium chloride SA (KLOR-CON M20) 20 MEQ tablet TAKE ONE TABLET BY MOUTH ONCE DAILY  30 tablet  6  . simvastatin (ZOCOR) 40 MG tablet Take 1 tablet (40 mg total) by mouth daily.  30 tablet  6  . torsemide (DEMADEX) 20 MG tablet TAKE TWO TABLETS BY MOUTH ONCE DAILY  180 tablet  1  . Vitamin D, Ergocalciferol, (DRISDOL) 50000 UNITS CAPS capsule TAKE ONE CAPSULE BY MOUTH ONCE A WEEK  12 capsule  0   No current facility-administered medications for this visit.    Past Medical History  Diagnosis Date  . HTN (hypertension)   . CAD (coronary artery disease)     a. s/p NSTEMI 4/11 => s/p CABG (L-LAD, S-OM2, S-PDA/PL);  b.  ETT-Myoview 6/14:  normal study, no ischemia, EF 67%  . Renal  insufficiency   . COPD (chronic obstructive pulmonary disease)   . Obesity   . HLD (hyperlipidemia)   . Leg edema   . Hx of echocardiogram     Echo 6/14: Mod LVH, focal basal hypertrophy, EF 50-55%, mild AS (mean 17 mmHg) and mild AI, mild to mod LAE, mild RAE  . Carotid stenosis     Carotid U/S 4/13:  RICA 2-44%, LICA 01-02%; right vertebral flow retrograde suggestive of steel-consider PV consult  . Subclavian artery stenosis, right     based upon carotid U/S done 11/2012    Past Surgical History  Procedure Laterality Date  . Hernia repair    . Tonsillectomy    . Coronary artery bypass graft      2011, LIMA to LAD coronary artery, SVG to OM2 branch of lect circumflex coronary artery, and a sequential SVG to psot descening to posterolateral branches to RCA  . Endoscopic vein harvesting      right leg     ROS:  As stated in the HPI and negative for all other systems.  PHYSICAL EXAM BP 140/76  Pulse 62  Ht 5\' 7"  (1.702 m)  Wt 228 lb (103.42 kg)  BMI 35.70 kg/m2  GENERAL:  Well appearing HEENT:  Pupils equal round and reactive, fundi not visualized, oral mucosa unremarkable NECK:  No jugular venous distention, waveform within normal limits, carotid upstroke brisk and symmetric, no bruits, no thyromegaly LUNGS:  Clear to auscultation bilaterally BACK:  No CVA tenderness CHEST:  Well healed sternotomy scar. HEART:  PMI not displaced or sustained,S1 and S2 within normal limits, no S3, no S4, no clicks, no rubs, soft right upper sternal border murmur early peaking ABD:  Flat, positive bowel sounds normal in frequency in pitch, no bruits, no rebound, no guarding, no midline pulsatile mass, no hepatomegaly, no splenomegaly, obese EXT:  2 plus pulses throughout, moderate edema, no cyanosis no clubbing   EKG: Sinus rhythm, rate 68, axis within normal limits, intervals within normal limits, no acute ST-T wave changes.  06/25/2013   ASSESSMENT AND PLAN  CORONARY ATHEROSCLEROSIS NATIVE  CORONARY ARTERY - He's had no new symptoms consistent with angina since his stress test last year. At this point no change in therapy or further imaging is indicated.  HYPERTENSION -  The blood pressure is slightly elevated but typically at target. No change in medications is indicated. We will continue with therapeutic lifestyle changes (TLC).   DYSLIPIDEMIA -   This is now followed at [facility]  PVD -   He did see Dr. Fletcher Anon and was not thought to have evidence of subclavian steal. No further therapy is indicated.   PVCs - Reason not particularly symptomatic. No change in therapy is indicated.  AS - This is mild and we can follow this clinically.

## 2013-07-10 ENCOUNTER — Other Ambulatory Visit: Payer: Self-pay | Admitting: Family Medicine

## 2013-07-14 ENCOUNTER — Telehealth: Payer: Self-pay | Admitting: Family Medicine

## 2013-07-14 NOTE — Telephone Encounter (Signed)
Last seen 05/23/13  [facility]   Last Vit D 02/20/13  63.3

## 2013-07-14 NOTE — Telephone Encounter (Signed)
Call patient : Prescription refilled & sent to pharmacy in [facility]. 

## 2013-08-22 ENCOUNTER — Ambulatory Visit (INDEPENDENT_AMBULATORY_CARE_PROVIDER_SITE_OTHER): Payer: Medicare Other | Admitting: Family Medicine

## 2013-08-22 ENCOUNTER — Encounter: Payer: Self-pay | Admitting: Family Medicine

## 2013-08-22 VITALS — BP 126/68 | HR 69 | Temp 97.3°F | Ht 67.0 in | Wt 228.4 lb

## 2013-08-22 DIAGNOSIS — E039 Hypothyroidism, unspecified: Secondary | ICD-10-CM

## 2013-08-22 DIAGNOSIS — I771 Stricture of artery: Secondary | ICD-10-CM

## 2013-08-22 DIAGNOSIS — E785 Hyperlipidemia, unspecified: Secondary | ICD-10-CM

## 2013-08-22 DIAGNOSIS — R609 Edema, unspecified: Secondary | ICD-10-CM

## 2013-08-22 DIAGNOSIS — I2510 Atherosclerotic heart disease of native coronary artery without angina pectoris: Secondary | ICD-10-CM

## 2013-08-22 DIAGNOSIS — E559 Vitamin D deficiency, unspecified: Secondary | ICD-10-CM

## 2013-08-22 DIAGNOSIS — I10 Essential (primary) hypertension: Secondary | ICD-10-CM

## 2013-08-22 DIAGNOSIS — I708 Atherosclerosis of other arteries: Secondary | ICD-10-CM

## 2013-08-22 DIAGNOSIS — I498 Other specified cardiac arrhythmias: Secondary | ICD-10-CM

## 2013-08-22 DIAGNOSIS — E1159 Type 2 diabetes mellitus with other circulatory complications: Secondary | ICD-10-CM

## 2013-08-22 DIAGNOSIS — R008 Other abnormalities of heart beat: Secondary | ICD-10-CM

## 2013-08-22 LAB — POCT GLYCOSYLATED HEMOGLOBIN (HGB A1C): Hemoglobin A1C: 5.9

## 2013-08-22 MED ORDER — GLUCOSE BLOOD VI STRP: ORAL_STRIP | Status: DC

## 2013-08-22 MED ORDER — ONETOUCH DELICA LANCETS 33G MISC: Status: DC

## 2013-08-22 NOTE — Patient Instructions (Signed)
Diabetes and Foot Care Diabetes may cause you to have problems because of poor blood supply (circulation) to your feet and legs. This may cause the skin on your feet to become thinner, break easier, and heal more slowly. Your skin may become dry, and the skin may peel and crack. You may also have nerve damage in your legs and feet causing decreased feeling in them. You may not notice minor injuries to your feet that could lead to infections or more serious problems. Taking care of your feet is one of the most important things you can do for yourself.  HOME CARE INSTRUCTIONS  Wear shoes at all times, even in the house. Do not go barefoot. Bare feet are easily injured.  Check your feet daily for blisters, cuts, and redness. If you cannot see the bottom of your feet, use a mirror or ask someone for help.  Wash your feet with warm water (do not use hot water) and mild soap. Then pat your feet and the areas between your toes until they are completely dry. Do not soak your feet as this can dry your skin.  Apply a moisturizing lotion or petroleum jelly (that does not contain alcohol and is unscented) to the skin on your feet and to dry, brittle toenails. Do not apply lotion between your toes.  Trim your toenails straight across. Do not dig under them or around the cuticle. File the edges of your nails with an emery board or nail file.  Do not cut corns or calluses or try to remove them with medicine.  Wear clean socks or stockings every day. Make sure they are not too tight. Do not wear knee-high stockings since they may decrease blood flow to your legs.  Wear shoes that fit properly and have enough cushioning. To break in new shoes, wear them for just a few hours a day. This prevents you from injuring your feet. Always look in your shoes before you put them on to be sure there are no objects inside.  Do not cross your legs. This may decrease the blood flow to your feet.  If you find a minor scrape,  cut, or break in the skin on your feet, keep it and the skin around it clean and dry. These areas may be cleansed with mild soap and water. Do not cleanse the area with peroxide, alcohol, or iodine.  When you remove an adhesive bandage, be sure not to damage the skin around it.  If you have a wound, look at it several times a day to make sure it is healing.  Do not use heating pads or hot water bottles. They may burn your skin. If you have lost feeling in your feet or legs, you may not know it is happening until it is too late.  Make sure your health care provider performs a complete foot exam at least annually or more often if you have foot problems. Report any cuts, sores, or bruises to your health care provider immediately. SEEK MEDICAL CARE IF:   You have an injury that is not healing.  You have cuts or breaks in the skin.  You have an ingrown nail.  You notice redness on your legs or feet.  You feel burning or tingling in your legs or feet.  You have pain or cramps in your legs and feet.  Your legs or feet are numb.  Your feet always feel cold. SEEK IMMEDIATE MEDICAL CARE IF:   There is increasing redness,   swelling, or pain in or around a wound.  There is a red line that goes up your leg.  Pus is coming from a wound.  You develop a fever or as directed by your health care provider.  You notice a bad smell coming from an ulcer or wound. Document Released: 05/19/2000 Document Revised: 01/22/2013 Document Reviewed: 10/29/2012 [facility] Patient Information 2014 [place].        Dr Paula Libra Recommendations  For nutrition information, I recommend books:  1).Eat to Live by Dr Excell Seltzer. 2).Prevent and Reverse Heart Disease by Dr Karl Luke. 3) Dr Janene Harvey Book:  Program to Reverse Diabetes  Exercise recommendations are:  If unable to walk, then the patient can exercise in a chair 3 times a day. By flapping arms like a bird gently and  raising legs outwards to the front.  If ambulatory, the patient can go for walks for 30 minutes 3 times a week. Then increase the intensity and duration as tolerated.  Goal is to try to attain exercise frequency to 5 times a week.  If applicable: Best to perform resistance exercises (machines or weights) 2 days a week and cardio type exercises 3 days per week.

## 2013-08-22 NOTE — Progress Notes (Signed)
Patient ID: Matthew May, male   DOB: [birth year], 68 y.o.   MRN: [id number] SUBJECTIVE: CC: Chief Complaint  Patient presents with  . Follow-up    3 month follow up chronic problems  refill lancets and strips    HPI: Patient is here for follow up of Diabetes Mellitus/CAD/hypothyroid//vit d def/HTN: Symptoms evaluated: Denies Nocturia ,Denies Urinary Frequency , denies Blurred vision ,deniesDizziness,denies.Dysuria,denies paresthesias, denies extremity pain or ulcers.Marland Kitchendenies chest pain. has had an annual eye exam. do check the feet. Does check CBGs. Average CBG:100s Denies episodes of hypoglycemia. Does have an emergency hypoglycemic plan. admits toCompliance with medications. Denies Problems with medications.  Doing and feeling great. Plans to work on losing 20 lbs.  Past Medical History  Diagnosis Date  . HTN (hypertension)   . CAD (coronary artery disease)     a. s/p NSTEMI 4/11 => s/p CABG (L-LAD, S-OM2, S-PDA/PL);  b.  ETT-Myoview 6/14:  normal study, no ischemia, EF 67%  . Renal insufficiency   . COPD (chronic obstructive pulmonary disease)   . Obesity   . HLD (hyperlipidemia)   . Leg edema   . Hx of echocardiogram     Echo 6/14: Mod LVH, focal basal hypertrophy, EF 50-55%, mild AS (mean 17 mmHg) and mild AI, mild to mod LAE, mild RAE  . Carotid stenosis     Carotid U/S 5/97:  RICA 4-16%, LICA 38-45%; right vertebral flow retrograde suggestive of steel-consider PV consult  . Subclavian artery stenosis, right     based upon carotid U/S done 11/2012   Past Surgical History  Procedure Laterality Date  . Hernia repair    . Tonsillectomy    . Coronary artery bypass graft      2011, LIMA to LAD coronary artery, SVG to OM2 branch of lect circumflex coronary artery, and a sequential SVG to psot descening to posterolateral branches to RCA  . Endoscopic vein harvesting      right leg    History   Social History  . Marital Status: Married    Spouse Name: N/A    Number  of Children: N/A  . Years of Education: N/A   Occupational History  . Not on file.   Social History Main Topics  . Smoking status: Former Smoker    Quit date: 07/11/2009  . Smokeless tobacco: Not on file     Comment: smokes about 10 cig/day; used to smoke 2 ppd for many years   . Alcohol Use: No  . Drug Use: No  . Sexual Activity: Not on file   Other Topics Concern  . Not on file   Social History Narrative   Lives with family   No family history on file. Current Outpatient Prescriptions on File Prior to Visit  Medication Sig Dispense Refill  . aspirin 325 MG tablet Take 325 mg by mouth daily.        Marland Kitchen FLUZONE HIGH-DOSE injection       . levothyroxine (SYNTHROID, LEVOTHROID) 112 MCG tablet TAKE ONE TABLET BY MOUTH ONCE DAILY  30 tablet  2  . metFORMIN (GLUCOPHAGE) 1000 MG tablet TAKE ONE TABLET BY MOUTH TWICE DAILY WITH MEALS  60 tablet  11  . nitroGLYCERIN (NITROSTAT) 0.4 MG SL tablet Place 1 tablet (0.4 mg total) under the tongue every 5 (five) minutes as needed for chest pain.  25 tablet  3  . potassium chloride SA (KLOR-CON M20) 20 MEQ tablet TAKE ONE TABLET BY MOUTH ONCE DAILY  30 tablet  6  .  simvastatin (ZOCOR) 40 MG tablet Take 1 tablet (40 mg total) by mouth daily.  30 tablet  6  . torsemide (DEMADEX) 20 MG tablet TAKE TWO TABLETS BY MOUTH ONCE DAILY  180 tablet  1  . Vitamin D, Ergocalciferol, (DRISDOL) 50000 UNITS CAPS capsule TAKE ONE CAPSULE BY MOUTH ONCE A WEEK  12 capsule  0   No current facility-administered medications on file prior to visit.   No Known Allergies Immunization History  Administered Date(s) Administered  . Influenza-Unspecified 02/03/2013   Prior to Admission medications   Medication Sig Start Date End Date Taking? Authorizing Provider  aspirin 325 MG tablet Take 325 mg by mouth daily.      Historical Provider, MD  FLUZONE HIGH-DOSE injection  04/08/13   Historical Provider, MD  levothyroxine (SYNTHROID, LEVOTHROID) 112 MCG tablet TAKE ONE  TABLET BY MOUTH ONCE DAILY 05/25/13   Vernie Shanks, MD  metFORMIN (GLUCOPHAGE) 1000 MG tablet TAKE ONE TABLET BY MOUTH TWICE DAILY WITH MEALS 12/26/12   Vernie Shanks, MD  nitroGLYCERIN (NITROSTAT) 0.4 MG SL tablet Place 1 tablet (0.4 mg total) under the tongue every 5 (five) minutes as needed for chest pain. 02/24/13 02/24/14  Vernie Shanks, MD  ONE TOUCH ULTRA TEST test strip  02/24/13   Historical Provider, MD  Jonetta Speak LANCETS 16X [place]  02/24/13   Historical Provider, MD  potassium chloride SA (KLOR-CON M20) 20 MEQ tablet TAKE ONE TABLET BY MOUTH ONCE DAILY 03/12/13   Minus Breeding, MD  simvastatin (ZOCOR) 40 MG tablet Take 1 tablet (40 mg total) by mouth daily. 02/24/13   Vernie Shanks, MD  torsemide (DEMADEX) 20 MG tablet TAKE TWO TABLETS BY MOUTH ONCE DAILY 02/24/13   Vernie Shanks, MD  Vitamin D, Ergocalciferol, (DRISDOL) 50000 UNITS CAPS capsule TAKE ONE CAPSULE BY MOUTH ONCE A WEEK 07/10/13   Vernie Shanks, MD     ROS: As above in the HPI. All other systems are stable or negative.  OBJECTIVE: APPEARANCE:  Patient in no acute distress.The patient appeared well nourished and normally developed. Acyanotic. Waist: VITAL SIGNS:BP 126/68  Pulse 69  Temp(Src) 97.3 F (36.3 C) (Oral)  Ht '5\' 7"'  (1.702 m)  Wt 228 lb 6.4 oz (103.602 kg)  BMI 35.76 kg/m2  WM obese  SKIN: warm and  Dry without overt rashes, tattoos and scars  HEAD and Neck: without JVD, Head and scalp: normal Eyes:No scleral icterus. Fundi normal, eye movements normal. Ears: Auricle normal, canal normal, Tympanic membranes normal, insufflation normal. Nose: normal Throat: normal Neck & thyroid: normal  CHEST & LUNGS: Chest wall: normal Lungs: Clear  CVS: Reveals the PMI to be normally located. Regular rhythm, First and Second Heart sounds are normal,  absence of murmurs, rubs or gallops. Peripheral vasculature: Radial pulses: normal Dorsal pedis pulses: normal Posterior pulses: normal  ABDOMEN:   Appearance: obese Benign, no organomegaly, no masses, no Abdominal Aortic enlargement. No Guarding , no rebound. No Bruits. Bowel sounds: normal  RECTAL: N/A GU: N/A  EXTREMETIES: nonedematous.  MUSCULOSKELETAL: Ambulates with a cane  NEUROLOGIC: oriented to time,place and person; nonfocal. Results for orders placed in visit on 05/23/13  CMP14+EGFR      Result Value Ref Range   Glucose 127 (*) 65 - 99 mg/dL   BUN 17  8 - 27 mg/dL   Creatinine, Ser 1.01  0.76 - 1.27 mg/dL   GFR calc non Af Amer 78  >59 mL/min/1.73   GFR calc Af Amer 90  >  59 mL/min/1.73   BUN/Creatinine Ratio 17  10 - 22   Sodium 141  134 - 144 mmol/L   Potassium 4.6  3.5 - 5.2 mmol/L   Chloride 96 (*) 97 - 108 mmol/L   CO2 29  18 - 29 mmol/L   Calcium 9.3  8.6 - 10.2 mg/dL   Total Protein 7.3  6.0 - 8.5 g/dL   Albumin 4.5  3.6 - 4.8 g/dL   Globulin, Total 2.8  1.5 - 4.5 g/dL   Albumin/Globulin Ratio 1.6  1.1 - 2.5   Total Bilirubin 0.4  0.0 - 1.2 mg/dL   Alkaline Phosphatase 74  39 - 117 IU/L   AST 20  0 - 40 IU/L   ALT 18  0 - 44 IU/L  NMR, LIPOPROFILE      Result Value Ref Range   LDL Particle Number [id number] (*) <1000 nmol/L   LDLC SERPL CALC-MCNC 81  <100 mg/dL   HDL Cholesterol by NMR 34 (*) >=40 mg/dL   Triglycerides by NMR 90  <150 mg/dL   Cholesterol 133  <200 mg/dL   HDL Particle Number 28.4 (*) >=30.5 umol/L   Small LDL Particle Number [id number] (*) <=527 nmol/L   LDL Size 19.8 (*) >20.5 nm   LP-IR Score 60 (*) <=45  TSH      Result Value Ref Range   TSH 5.660 (*) 0.450 - 4.500 uIU/mL  MICROALBUMIN, URINE      Result Value Ref Range   Microalbum.,U,Random 3.5  0.0 - 17.0 ug/mL  POCT GLYCOSYLATED HEMOGLOBIN (HGB A1C)      Result Value Ref Range   Hemoglobin A1C 6.0    POCT UA - MICROALBUMIN      Result Value Ref Range   Microalbumin Ur, POC 20      ASSESSMENT:  Type II or unspecified type diabetes mellitus with peripheral circulatory disorders, uncontrolled(250.72) - Plan: glucose blood  (ONE TOUCH ULTRA TEST) test strip, ONETOUCH DELICA LANCETS 01V MISC, POCT glycosylated hemoglobin (Hb A1C), CMP14+EGFR  HYPERTENSION  Subclavian artery stenosis, right  DYSLIPIDEMIA - Plan: CMP14+EGFR, Lipid panel  CORONARY ATHEROSCLEROSIS NATIVE CORONARY ARTERY  Unspecified vitamin D deficiency - Plan: Vit D  25 hydroxy (rtn osteoporosis monitoring)  Unspecified hypothyroidism - Plan: TSH  Hypothyroidism  Edema  Trigeminy  PLAN:       Dr Paula Libra Recommendations  For nutrition information, I recommend books:  1).Eat to Live by Dr Excell Seltzer. 2).Prevent and Reverse Heart Disease by Dr Karl Luke. 3) Dr Janene Harvey Book:  Program to Reverse Diabetes  Exercise recommendations are:  If unable to walk, then the patient can exercise in a chair 3 times a day. By flapping arms like a bird gently and raising legs outwards to the front.  If ambulatory, the patient can go for walks for 30 minutes 3 times a week. Then increase the intensity and duration as tolerated.  Goal is to try to attain exercise frequency to 5 times a week.  If applicable: Best to perform resistance exercises (machines or weights) 2 days a week and cardio type exercises 3 days per week.   DM foot care Orders Placed This Encounter  Procedures  . CMP14+EGFR  . Lipid panel  . TSH  . Vit D  25 hydroxy (rtn osteoporosis monitoring)  . POCT glycosylated hemoglobin (Hb A1C)   Meds ordered this encounter  Medications  . glucose blood (ONE TOUCH ULTRA TEST) test strip    Sig: Use to test daily  Dispense:  100 each    Refill:  3  . ONETOUCH DELICA LANCETS 94W MISC    Sig: Use one daily    Dispense:  100 each    Refill:  3   Medications Discontinued During This Encounter  Medication Reason  . ONE TOUCH ULTRA TEST test strip Reorder  . ONETOUCH DELICA LANCETS 96P MISC Reorder   Return in about 3 months (around 11/22/2013) for Recheck medical problems.  Whitni Pasquini P. Jacelyn Grip,  M.D.

## 2013-08-23 LAB — CMP14+EGFR
ALT: 22 IU/L (ref 0–44)
AST: 22 IU/L (ref 0–40)
Albumin/Globulin Ratio: 1.8 (ref 1.1–2.5)
Albumin: 4.5 g/dL (ref 3.6–4.8)
Alkaline Phosphatase: 60 IU/L (ref 39–117)
BUN/Creatinine Ratio: 17 (ref 10–22)
BUN: 15 mg/dL (ref 8–27)
CO2: 25 mmol/L (ref 18–29)
Calcium: 9.2 mg/dL (ref 8.6–10.2)
Chloride: 95 mmol/L — ABNORMAL LOW (ref 97–108)
Creatinine, Ser: 0.88 mg/dL (ref 0.76–1.27)
GFR calc Af Amer: 103 mL/min/{1.73_m2} (ref >59)
GFR calc non Af Amer: 90 mL/min/{1.73_m2} (ref >59)
Globulin, Total: 2.5 g/dL (ref 1.5–4.5)
Glucose: 107 mg/dL — ABNORMAL HIGH (ref 65–99)
Potassium: 3.8 mmol/L (ref 3.5–5.2)
Sodium: 139 mmol/L (ref 134–144)
Total Bilirubin: 0.5 mg/dL (ref 0.0–1.2)
Total Protein: 7 g/dL (ref 6.0–8.5)

## 2013-08-23 LAB — TSH: TSH: 3.27 u[IU]/mL (ref 0.450–4.500)

## 2013-08-23 LAB — LIPID PANEL
Chol/HDL Ratio: 4.4 ratio units (ref 0.0–5.0)
Cholesterol, Total: 141 mg/dL (ref 100–199)
HDL: 32 mg/dL — ABNORMAL LOW (ref >39)
LDL Calculated: 90 mg/dL (ref 0–99)
Triglycerides: 96 mg/dL (ref 0–149)
VLDL Cholesterol Cal: 19 mg/dL (ref 5–40)

## 2013-08-23 LAB — VITAMIN D 25 HYDROXY (VIT D DEFICIENCY, FRACTURES): Vit D, 25-Hydroxy: 47.3 ng/mL (ref 30.0–100.0)

## 2013-09-02 ENCOUNTER — Telehealth: Payer: Self-pay | Admitting: Family Medicine

## 2013-09-02 ENCOUNTER — Other Ambulatory Visit: Payer: Self-pay | Admitting: Family Medicine

## 2013-09-03 ENCOUNTER — Other Ambulatory Visit: Payer: Self-pay | Admitting: Family Medicine

## 2013-09-03 MED ORDER — LEVOTHYROXINE SODIUM 112 MCG PO TABS: ORAL_TABLET | ORAL | Status: DC

## 2013-09-03 NOTE — Telephone Encounter (Signed)
Call patient : Prescription refilled & sent to pharmacy in [facility]. 

## 2013-09-10 ENCOUNTER — Other Ambulatory Visit: Payer: Self-pay | Admitting: Family Medicine

## 2013-09-26 ENCOUNTER — Other Ambulatory Visit: Payer: Self-pay | Admitting: Family Medicine

## 2013-09-29 NOTE — Telephone Encounter (Signed)
Last seen 08/22/13 FPW  Last Vit D level 08/23/13 normal 47.3

## 2013-09-29 NOTE — Telephone Encounter (Signed)
Call patient : Prescription refilled & sent to pharmacy in [facility]. 

## 2013-09-30 ENCOUNTER — Telehealth: Payer: Self-pay | Admitting: Family Medicine

## 2013-10-13 ENCOUNTER — Other Ambulatory Visit: Payer: Self-pay | Admitting: Cardiology

## 2013-10-13 ENCOUNTER — Other Ambulatory Visit: Payer: Self-pay | Admitting: Family Medicine

## 2013-10-14 ENCOUNTER — Telehealth: Payer: Self-pay | Admitting: Family

## 2013-11-24 ENCOUNTER — Ambulatory Visit: Payer: Medicare Other | Admitting: Family

## 2013-12-01 ENCOUNTER — Encounter (HOSPITAL_COMMUNITY): Payer: Medicare Other

## 2013-12-01 ENCOUNTER — Other Ambulatory Visit (HOSPITAL_COMMUNITY): Payer: Self-pay | Admitting: Cardiology

## 2013-12-01 DIAGNOSIS — I6529 Occlusion and stenosis of unspecified carotid artery: Secondary | ICD-10-CM

## 2013-12-11 ENCOUNTER — Other Ambulatory Visit: Payer: Self-pay | Admitting: *Deleted

## 2013-12-11 MED ORDER — VITAMIN D (ERGOCALCIFEROL) 1.25 MG (50000 UNIT) PO CAPS: ORAL_CAPSULE | ORAL | Status: DC

## 2013-12-11 NOTE — Telephone Encounter (Signed)
Last vit D was normal 3/15. Dr. Jacelyn Grip pt

## 2013-12-12 ENCOUNTER — Ambulatory Visit (HOSPITAL_COMMUNITY): Payer: Medicare Other | Attending: Cardiology | Admitting: Cardiology

## 2013-12-12 DIAGNOSIS — F17200 Nicotine dependence, unspecified, uncomplicated: Secondary | ICD-10-CM | POA: Insufficient documentation

## 2013-12-12 DIAGNOSIS — G458 Other transient cerebral ischemic attacks and related syndromes: Secondary | ICD-10-CM | POA: Insufficient documentation

## 2013-12-12 DIAGNOSIS — I2510 Atherosclerotic heart disease of native coronary artery without angina pectoris: Secondary | ICD-10-CM | POA: Insufficient documentation

## 2013-12-12 DIAGNOSIS — R42 Dizziness and giddiness: Secondary | ICD-10-CM | POA: Insufficient documentation

## 2013-12-12 DIAGNOSIS — I10 Essential (primary) hypertension: Secondary | ICD-10-CM | POA: Insufficient documentation

## 2013-12-12 DIAGNOSIS — E119 Type 2 diabetes mellitus without complications: Secondary | ICD-10-CM | POA: Insufficient documentation

## 2013-12-12 DIAGNOSIS — I6529 Occlusion and stenosis of unspecified carotid artery: Secondary | ICD-10-CM | POA: Insufficient documentation

## 2013-12-12 DIAGNOSIS — E785 Hyperlipidemia, unspecified: Secondary | ICD-10-CM | POA: Insufficient documentation

## 2013-12-12 NOTE — Progress Notes (Signed)
Carotid duplex performed 

## 2013-12-16 ENCOUNTER — Ambulatory Visit (INDEPENDENT_AMBULATORY_CARE_PROVIDER_SITE_OTHER): Payer: Medicare Other | Admitting: Family

## 2013-12-16 ENCOUNTER — Encounter: Payer: Self-pay | Admitting: Family

## 2013-12-16 VITALS — BP 101/69 | HR 62 | Temp 97.0°F | Ht 67.0 in | Wt 223.2 lb

## 2013-12-16 DIAGNOSIS — R609 Edema, unspecified: Secondary | ICD-10-CM

## 2013-12-16 DIAGNOSIS — Z125 Encounter for screening for malignant neoplasm of prostate: Secondary | ICD-10-CM

## 2013-12-16 DIAGNOSIS — E785 Hyperlipidemia, unspecified: Secondary | ICD-10-CM

## 2013-12-16 DIAGNOSIS — E559 Vitamin D deficiency, unspecified: Secondary | ICD-10-CM

## 2013-12-16 DIAGNOSIS — E1159 Type 2 diabetes mellitus with other circulatory complications: Secondary | ICD-10-CM

## 2013-12-16 DIAGNOSIS — E039 Hypothyroidism, unspecified: Secondary | ICD-10-CM

## 2013-12-16 LAB — POCT GLYCOSYLATED HEMOGLOBIN (HGB A1C): Hemoglobin A1C: 5.8

## 2013-12-16 MED ORDER — VITAMIN D (ERGOCALCIFEROL) 1.25 MG (50000 UNIT) PO CAPS: ORAL_CAPSULE | ORAL | Status: DC

## 2013-12-16 MED ORDER — TORSEMIDE 20 MG PO TABS: ORAL_TABLET | ORAL | Status: DC

## 2013-12-16 NOTE — Patient Instructions (Signed)

## 2013-12-16 NOTE — Progress Notes (Signed)
Subjective:    Patient ID: Matthew May, male    DOB: [birth year], 68 y.o.   MRN: [id number]  Diabetes He presents for his follow-up diabetic visit. He has type 2 diabetes mellitus. His disease course has been improving. Pertinent negatives for hypoglycemia include no confusion or dizziness. Pertinent negatives for diabetes include no foot paresthesias, no foot ulcerations and no visual change. Pertinent negatives for hypoglycemia complications include no blackouts. Symptoms are stable. Diabetic complications include heart disease. Pertinent negatives for diabetic complications include no nephropathy or peripheral neuropathy. Risk factors for coronary artery disease include diabetes mellitus, family history, dyslipidemia, hypertension, male sex and post-menopausal. Current diabetic treatment includes oral agent (monotherapy). He is compliant with treatment all of the time. He is following a diabetic diet. His breakfast blood glucose range is generally 110-130 mg/dl. Eye exam is not current.  Hyperlipidemia This is a chronic problem. The current episode started more than 1 year ago. The problem is controlled. Recent lipid tests were reviewed and are normal. Exacerbating diseases include diabetes and hypothyroidism. Factors aggravating his hyperlipidemia include fatty foods. Pertinent negatives include no leg pain or myalgias. Current antihyperlipidemic treatment includes statins. The current treatment provides moderate improvement of lipids. Risk factors for coronary artery disease include diabetes mellitus, dyslipidemia, hypertension and male sex.  Thyroid Problem Presents for follow-up visit. Symptoms include leg swelling. Patient reports no anxiety, constipation, diarrhea, hair loss, palpitations or visual change. The symptoms have been stable. Past treatments include levothyroxine. The treatment provided significant relief. His past medical history is significant for diabetes and hyperlipidemia.       Review of Systems  Constitutional: Negative.   HENT: Negative.   Respiratory: Negative.   Cardiovascular: Negative.  Negative for palpitations.  Gastrointestinal: Negative.  Negative for diarrhea and constipation.  Endocrine: Negative.   Genitourinary: Negative.   Musculoskeletal: Negative.  Negative for myalgias.  Neurological: Negative.  Negative for dizziness.  Hematological: Negative.   Psychiatric/Behavioral: Negative.  Negative for confusion.  All other systems reviewed and are negative.      Objective:   Physical Exam  Vitals reviewed. Constitutional: He is oriented to person, place, and time. He appears well-developed and well-nourished. No distress.  HENT:  Head: Normocephalic.  Right Ear: External ear normal.  Left Ear: External ear normal.  Mouth/Throat: Oropharynx is clear and moist.  Eyes: Pupils are equal, round, and reactive to light. Right eye exhibits no discharge. Left eye exhibits no discharge.  Neck: Normal range of motion. Neck supple. No thyromegaly present.  Cardiovascular: Normal rate, regular rhythm, normal heart sounds and intact distal pulses.   No murmur heard. Pulmonary/Chest: Effort normal and breath sounds normal. No respiratory distress. He has no wheezes.  Abdominal: Soft. Bowel sounds are normal. He exhibits no distension. There is no tenderness.  Musculoskeletal: Normal range of motion. He exhibits no edema and no tenderness.  Neurological: He is alert and oriented to person, place, and time. He has normal reflexes. No cranial nerve deficit.  Skin: Skin is warm and dry. No rash noted. No erythema.  Psychiatric: He has a normal mood and affect. His behavior is normal. Judgment and thought content normal.    BP 101/69  Pulse 62  Temp(Src) 97 F (36.1 C) (Oral)  Ht '5\' 7"'  (1.702 m)  Wt 223 lb 3.2 oz (101.243 kg)  BMI 34.95 kg/m2       Assessment & Plan:  1. Type II or unspecified type diabetes mellitus with peripheral  circulatory disorders, uncontrolled(250.72) -  CMP14+EGFR - POCT glycosylated hemoglobin (Hb A1C)  2. Hypothyroidism, unspecified hypothyroidism type - Thyroid Panel With TSH  3. Unspecified vitamin D deficiency - Vit D  25 hydroxy (rtn osteoporosis monitoring) - Vitamin D, Ergocalciferol, (DRISDOL) 50000 UNITS CAPS capsule; TAKE ONE CAPSULE BY MOUTH ONCE A WEEK  Dispense: 12 capsule; Refill: 6  4. DYSLIPIDEMIA - Lipid panel  5. Peripheral edema - torsemide (DEMADEX) 20 MG tablet; TAKE TWO TABLETS BY MOUTH ONCE DAILY  Dispense: 180 tablet; Refill: 1  6. Prostate cancer screening - PSA, total and free   Continue all meds Labs pending Health Maintenance reviewed Diet and exercise encouraged RTO 6 months  Evelina Dun, FNP

## 2013-12-17 ENCOUNTER — Encounter: Payer: Self-pay | Admitting: *Deleted

## 2013-12-17 LAB — THYROID PANEL WITH TSH
Free Thyroxine Index: 2.2 (ref 1.2–4.9)
T3 Uptake Ratio: 29 % (ref 24–39)
T4, Total: 7.6 ug/dL (ref 4.5–12.0)
TSH: 4.05 u[IU]/mL (ref 0.450–4.500)

## 2013-12-17 LAB — CMP14+EGFR
ALT: 19 IU/L (ref 0–44)
AST: 20 IU/L (ref 0–40)
Albumin/Globulin Ratio: 2 (ref 1.1–2.5)
Albumin: 4.5 g/dL (ref 3.6–4.8)
Alkaline Phosphatase: 62 IU/L (ref 39–117)
BUN/Creatinine Ratio: 14 (ref 10–22)
BUN: 12 mg/dL (ref 8–27)
CO2: 24 mmol/L (ref 18–29)
Calcium: 9 mg/dL (ref 8.6–10.2)
Chloride: 97 mmol/L (ref 97–108)
Creatinine, Ser: 0.87 mg/dL (ref 0.76–1.27)
GFR calc Af Amer: 104 mL/min/{1.73_m2} (ref >59)
GFR calc non Af Amer: 90 mL/min/{1.73_m2} (ref >59)
Globulin, Total: 2.3 g/dL (ref 1.5–4.5)
Glucose: 115 mg/dL — ABNORMAL HIGH (ref 65–99)
Potassium: 4.1 mmol/L (ref 3.5–5.2)
Sodium: 140 mmol/L (ref 134–144)
Total Bilirubin: 0.4 mg/dL (ref 0.0–1.2)
Total Protein: 6.8 g/dL (ref 6.0–8.5)

## 2013-12-17 LAB — LIPID PANEL
Chol/HDL Ratio: 3.7 ratio units (ref 0.0–5.0)
Cholesterol, Total: 131 mg/dL (ref 100–199)
HDL: 35 mg/dL — ABNORMAL LOW (ref >39)
LDL Calculated: 75 mg/dL (ref 0–99)
Triglycerides: 107 mg/dL (ref 0–149)
VLDL Cholesterol Cal: 21 mg/dL (ref 5–40)

## 2013-12-17 LAB — PSA, TOTAL AND FREE
PSA, Free Pct: 52.9 %
PSA, Free: 0.37 ng/mL
PSA: 0.7 ng/mL (ref 0.0–4.0)

## 2013-12-17 LAB — VITAMIN D 25 HYDROXY (VIT D DEFICIENCY, FRACTURES): Vit D, 25-Hydroxy: 46.9 ng/mL (ref 30.0–100.0)

## 2014-01-14 ENCOUNTER — Other Ambulatory Visit: Payer: Self-pay | Admitting: *Deleted

## 2014-01-14 DIAGNOSIS — E1159 Type 2 diabetes mellitus with other circulatory complications: Secondary | ICD-10-CM

## 2014-01-14 MED ORDER — METFORMIN HCL 1000 MG PO TABS: ORAL_TABLET | ORAL | Status: DC

## 2014-02-10 ENCOUNTER — Other Ambulatory Visit: Payer: Self-pay

## 2014-02-10 MED ORDER — SIMVASTATIN 40 MG PO TABS: ORAL_TABLET | ORAL | Status: DC

## 2014-03-18 ENCOUNTER — Encounter (INDEPENDENT_AMBULATORY_CARE_PROVIDER_SITE_OTHER): Payer: Self-pay

## 2014-03-18 ENCOUNTER — Ambulatory Visit (INDEPENDENT_AMBULATORY_CARE_PROVIDER_SITE_OTHER): Payer: Medicare Other | Admitting: Family

## 2014-03-18 ENCOUNTER — Encounter: Payer: Self-pay | Admitting: Family

## 2014-03-18 VITALS — BP 122/71 | HR 59 | Temp 97.0°F | Ht 67.0 in | Wt 228.4 lb

## 2014-03-18 DIAGNOSIS — E785 Hyperlipidemia, unspecified: Secondary | ICD-10-CM

## 2014-03-18 DIAGNOSIS — E559 Vitamin D deficiency, unspecified: Secondary | ICD-10-CM

## 2014-03-18 DIAGNOSIS — I2510 Atherosclerotic heart disease of native coronary artery without angina pectoris: Secondary | ICD-10-CM

## 2014-03-18 DIAGNOSIS — E119 Type 2 diabetes mellitus without complications: Secondary | ICD-10-CM

## 2014-03-18 DIAGNOSIS — E039 Hypothyroidism, unspecified: Secondary | ICD-10-CM

## 2014-03-18 DIAGNOSIS — I10 Essential (primary) hypertension: Secondary | ICD-10-CM

## 2014-03-18 DIAGNOSIS — R609 Edema, unspecified: Secondary | ICD-10-CM

## 2014-03-18 LAB — POCT GLYCOSYLATED HEMOGLOBIN (HGB A1C): Hemoglobin A1C: 6

## 2014-03-18 MED ORDER — VITAMIN D (ERGOCALCIFEROL) 1.25 MG (50000 UNIT) PO CAPS: ORAL_CAPSULE | ORAL | Status: DC

## 2014-03-18 MED ORDER — TORSEMIDE 20 MG PO TABS: ORAL_TABLET | ORAL | Status: DC

## 2014-03-18 NOTE — Patient Instructions (Signed)

## 2014-03-18 NOTE — Progress Notes (Signed)
Subjective:    Patient ID: Matthew May, male    DOB: 1945/07/08, 68 y.o.   MRN: [id number]  Hyperlipidemia This is a chronic problem. The current episode started more than 1 year ago. The problem is controlled. Recent lipid tests were reviewed and are normal. Exacerbating diseases include diabetes and hypothyroidism. He has no history of obesity. Factors aggravating his hyperlipidemia include fatty foods. Pertinent negatives include no leg pain or myalgias. Current antihyperlipidemic treatment includes statins. The current treatment provides moderate improvement of lipids. Risk factors for coronary artery disease include diabetes mellitus, dyslipidemia, hypertension and male sex.  Diabetes He presents for his follow-up diabetic visit. He has type 2 diabetes mellitus. His disease course has been improving. Pertinent negatives for hypoglycemia include no confusion or dizziness. Pertinent negatives for diabetes include no foot paresthesias, no foot ulcerations and no visual change. Pertinent negatives for hypoglycemia complications include no blackouts. Symptoms are stable. Diabetic complications include heart disease. Pertinent negatives for diabetic complications include no CVA, nephropathy or peripheral neuropathy. Risk factors for coronary artery disease include diabetes mellitus, family history, dyslipidemia, hypertension, male sex and post-menopausal. Current diabetic treatment includes oral agent (monotherapy). He is compliant with treatment all of the time. He is following a diabetic diet. His breakfast blood glucose range is generally 110-130 mg/dl. An ACE inhibitor/angiotensin II receptor blocker is not being taken. Eye exam is not current.  Thyroid Problem Presents for follow-up visit. Symptoms include leg swelling. Patient reports no anxiety, constipation, diarrhea, hair loss or visual change. The symptoms have been stable. Past treatments include levothyroxine. The treatment provided  significant relief. His past medical history is significant for diabetes and hyperlipidemia.      Review of Systems  Constitutional: Negative.   HENT: Negative.   Respiratory: Negative.   Cardiovascular: Negative.   Gastrointestinal: Negative.  Negative for diarrhea and constipation.  Endocrine: Negative.   Genitourinary: Negative.   Musculoskeletal: Negative.  Negative for myalgias.  Neurological: Negative.  Negative for dizziness.  Hematological: Negative.   Psychiatric/Behavioral: Negative.  Negative for confusion.  All other systems reviewed and are negative.      Objective:   Physical Exam  Vitals reviewed. Constitutional: He is oriented to person, place, and time. He appears well-developed and well-nourished. No distress.  HENT:  Head: Normocephalic.  Right Ear: External ear normal.  Left Ear: External ear normal.  Nose: Nose normal.  Mouth/Throat: Oropharynx is clear and moist.  Eyes: Pupils are equal, round, and reactive to light. Right eye exhibits no discharge. Left eye exhibits no discharge.  Neck: Normal range of motion. Neck supple. No thyromegaly present.  Cardiovascular: Normal rate, regular rhythm and intact distal pulses.   Murmur heard. Pulmonary/Chest: Effort normal and breath sounds normal. No respiratory distress. He has no wheezes.  Abdominal: Soft. Bowel sounds are normal. He exhibits no distension. There is no tenderness.  Musculoskeletal: Normal range of motion. He exhibits no edema and no tenderness.  Neurological: He is alert and oriented to person, place, and time. He has normal reflexes. No cranial nerve deficit.  Skin: Skin is warm and dry. No rash noted. No erythema.  Psychiatric: He has a normal mood and affect. His behavior is normal. Judgment and thought content normal.      BP 122/71  Pulse 59  Temp(Src) 97 F (36.1 C) (Oral)  Ht '5\' 7"'  (1.702 m)  Wt 228 lb 6.4 oz (103.602 kg)  BMI 35.76 kg/m2     Assessment & Plan:  1. Type  2  diabetes mellitus without complication - POCT glycosylated hemoglobin (Hb A1C) - CMP14+EGFR  2. Essential hypertension - CMP14+EGFR  3. Atherosclerosis of native coronary artery of native heart without angina pectoris - CMP14+EGFR  4. Hypothyroidism, unspecified hypothyroidism type - CMP14+EGFR - Thyroid Panel With TSH  5. Vitamin D deficiency - CMP14+EGFR - Vit D  25 hydroxy (rtn osteoporosis monitoring) - Vitamin D, Ergocalciferol, (DRISDOL) 50000 UNITS CAPS capsule; TAKE ONE CAPSULE BY MOUTH ONCE A WEEK  Dispense: 12 capsule; Refill: 6  6. Hyperlipidemia - CMP14+EGFR - Lipid panel  7. Peripheral edema - CMP14+EGFR - torsemide (DEMADEX) 20 MG tablet; TAKE TWO TABLETS BY MOUTH ONCE DAILY  Dispense: 180 tablet; Refill: 1   Continue all meds Labs pending Health Maintenance reviewed-hemoccult cards given to patient with directions Diet and exercise encouraged RTO 6 months  Evelina Dun, FNP

## 2014-03-19 ENCOUNTER — Other Ambulatory Visit: Payer: Self-pay | Admitting: Family

## 2014-03-19 DIAGNOSIS — E785 Hyperlipidemia, unspecified: Secondary | ICD-10-CM

## 2014-03-19 LAB — THYROID PANEL WITH TSH
Free Thyroxine Index: 1.9 (ref 1.2–4.9)
T3 Uptake Ratio: 29 % (ref 24–39)
T4, Total: 6.5 ug/dL (ref 4.5–12.0)
TSH: 4.37 u[IU]/mL (ref 0.450–4.500)

## 2014-03-19 LAB — CMP14+EGFR
ALT: 23 IU/L (ref 0–44)
AST: 18 IU/L (ref 0–40)
Albumin/Globulin Ratio: 1.7 (ref 1.1–2.5)
Albumin: 4.2 g/dL (ref 3.6–4.8)
Alkaline Phosphatase: 62 IU/L (ref 39–117)
BUN/Creatinine Ratio: 21 (ref 10–22)
BUN: 17 mg/dL (ref 8–27)
CO2: 21 mmol/L (ref 18–29)
Calcium: 9 mg/dL (ref 8.6–10.2)
Chloride: 101 mmol/L (ref 97–108)
Creatinine, Ser: 0.81 mg/dL (ref 0.76–1.27)
GFR calc Af Amer: 107 mL/min/{1.73_m2} (ref >59)
GFR calc non Af Amer: 93 mL/min/{1.73_m2} (ref >59)
Globulin, Total: 2.5 g/dL (ref 1.5–4.5)
Glucose: 114 mg/dL — ABNORMAL HIGH (ref 65–99)
Potassium: 4.4 mmol/L (ref 3.5–5.2)
Sodium: 141 mmol/L (ref 134–144)
Total Bilirubin: <0.2 mg/dL (ref 0.0–1.2)
Total Protein: 6.7 g/dL (ref 6.0–8.5)

## 2014-03-19 LAB — LIPID PANEL
Chol/HDL Ratio: 6.1 ratio units — ABNORMAL HIGH (ref 0.0–5.0)
Cholesterol, Total: 190 mg/dL (ref 100–199)
HDL: 31 mg/dL — ABNORMAL LOW (ref >39)
LDL Calculated: 121 mg/dL — ABNORMAL HIGH (ref 0–99)
Triglycerides: 189 mg/dL — ABNORMAL HIGH (ref 0–149)
VLDL Cholesterol Cal: 38 mg/dL (ref 5–40)

## 2014-03-19 LAB — VITAMIN D 25 HYDROXY (VIT D DEFICIENCY, FRACTURES): Vit D, 25-Hydroxy: 42.5 ng/mL (ref 30.0–100.0)

## 2014-03-19 MED ORDER — ATORVASTATIN CALCIUM 40 MG PO TABS: 40.0000 mg | ORAL_TABLET | Freq: Every day | ORAL | Status: DC

## 2014-03-20 ENCOUNTER — Other Ambulatory Visit: Payer: Medicare Other

## 2014-03-20 DIAGNOSIS — Z1212 Encounter for screening for malignant neoplasm of rectum: Secondary | ICD-10-CM

## 2014-03-20 NOTE — Progress Notes (Signed)
Lab only 

## 2014-03-22 LAB — FECAL OCCULT BLOOD, IMMUNOCHEMICAL: Fecal Occult Bld: NEGATIVE

## 2014-03-23 ENCOUNTER — Telehealth: Payer: Self-pay | Admitting: *Deleted

## 2014-03-23 NOTE — Telephone Encounter (Signed)
Aware. 

## 2014-03-24 ENCOUNTER — Telehealth: Payer: Self-pay | Admitting: Family Medicine

## 2014-03-24 NOTE — Telephone Encounter (Signed)
Pt aware of lab results and rx at pharmacy. °

## 2014-03-30 ENCOUNTER — Encounter: Payer: Self-pay | Admitting: *Deleted

## 2014-04-14 ENCOUNTER — Other Ambulatory Visit: Payer: Self-pay | Admitting: Nurse Practitioner

## 2014-04-17 ENCOUNTER — Other Ambulatory Visit: Payer: Self-pay

## 2014-04-17 MED ORDER — POTASSIUM CHLORIDE CRYS ER 20 MEQ PO TBCR: EXTENDED_RELEASE_TABLET | ORAL | Status: DC

## 2014-06-17 ENCOUNTER — Other Ambulatory Visit: Payer: Self-pay | Admitting: Family Medicine

## 2014-07-15 ENCOUNTER — Other Ambulatory Visit: Payer: Self-pay | Admitting: Family Medicine

## 2014-08-11 ENCOUNTER — Other Ambulatory Visit: Payer: Self-pay | Admitting: Family

## 2014-09-15 ENCOUNTER — Other Ambulatory Visit: Payer: Self-pay | Admitting: Family

## 2014-09-16 ENCOUNTER — Encounter: Payer: Self-pay | Admitting: *Deleted

## 2014-09-16 ENCOUNTER — Ambulatory Visit (INDEPENDENT_AMBULATORY_CARE_PROVIDER_SITE_OTHER): Payer: Medicare Other | Admitting: *Deleted

## 2014-09-16 VITALS — BP 127/70 | HR 70 | Ht 67.0 in | Wt 229.0 lb

## 2014-09-16 DIAGNOSIS — E559 Vitamin D deficiency, unspecified: Secondary | ICD-10-CM | POA: Diagnosis not present

## 2014-09-16 DIAGNOSIS — Z0000 Encounter for general adult medical examination without abnormal findings: Secondary | ICD-10-CM | POA: Diagnosis not present

## 2014-09-16 DIAGNOSIS — Z23 Encounter for immunization: Secondary | ICD-10-CM

## 2014-09-16 DIAGNOSIS — E119 Type 2 diabetes mellitus without complications: Secondary | ICD-10-CM

## 2014-09-16 MED ORDER — GLUCOSE BLOOD VI STRP: ORAL_STRIP | Status: DC

## 2014-09-16 MED ORDER — ONETOUCH DELICA LANCETS 33G MISC: Status: DC

## 2014-09-16 MED ORDER — VITAMIN D (ERGOCALCIFEROL) 1.25 MG (50000 UNIT) PO CAPS: ORAL_CAPSULE | ORAL | Status: DC

## 2014-09-16 NOTE — Patient Instructions (Addendum)
I have scheduled you for a follow up appointment with Evelina Dun NP on Wednesday September 21, 2014 at 11:25 am  I am going to check into referring you to an eye doctor and a dentist  You received a pneumonia booster shot today  The website to look at for information on Advanced Directives is [email or web address]  Preventive Care for Adults A healthy lifestyle and preventive care can promote health and wellness. Preventive health guidelines for men include the following key practices:  A routine yearly physical is a good way to check with your health care provider about your health and preventative screening. It is a chance to share any concerns and updates on your health and to receive a thorough exam.  Visit your dentist for a routine exam and preventative care every 6 months. Brush your teeth twice a day and floss once a day. Good oral hygiene prevents tooth decay and gum disease.  The frequency of eye exams is based on your age, health, family medical history, use of contact lenses, and other factors. Follow your health care provider's recommendations for frequency of eye exams.  Eat a healthy diet. Foods such as vegetables, fruits, whole grains, low-fat dairy products, and lean protein foods contain the nutrients you need without too many calories. Decrease your intake of foods high in solid fats, added sugars, and salt. Eat the right amount of calories for you.Get information about a proper diet from your health care provider, if necessary.  Regular physical exercise is one of the most important things you can do for your health. Most adults should get at least 150 minutes of moderate-intensity exercise (any activity that increases your heart rate and causes you to sweat) each week. In addition, most adults need muscle-strengthening exercises on 2 or more days a week.  Maintain a healthy weight. The body mass index (BMI) is a screening tool to identify possible weight problems. It provides  an estimate of body fat based on height and weight. Your health care provider can find your BMI and can help you achieve or maintain a healthy weight.For adults 20 years and older:  A BMI below 18.5 is considered underweight.  A BMI of 18.5 to 24.9 is normal.  A BMI of 25 to 29.9 is considered overweight.  A BMI of 30 and above is considered obese.  Maintain normal blood lipids and cholesterol levels by exercising and minimizing your intake of saturated fat. Eat a balanced diet with plenty of fruit and vegetables. Blood tests for lipids and cholesterol should begin at age 45 and be repeated every 5 years. If your lipid or cholesterol levels are high, you are over 50, or you are at high risk for heart disease, you may need your cholesterol levels checked more frequently.Ongoing high lipid and cholesterol levels should be treated with medicines if diet and exercise are not working.  If you smoke, find out from your health care provider how to quit. If you do not use tobacco, do not start.  Lung cancer screening is recommended for adults aged 59-80 years who are at high risk for developing lung cancer because of a history of smoking. A yearly low-dose CT scan of the lungs is recommended for people who have at least a 30-pack-year history of smoking and are a current smoker or have quit within the past 15 years. A pack year of smoking is smoking an average of 1 pack of cigarettes a day for 1 year (for example: 1 pack a day  for 30 years or 2 packs a day for 15 years). Yearly screening should continue until the smoker has stopped smoking for at least 15 years. Yearly screening should be stopped for people who develop a health problem that would prevent them from having lung cancer treatment.  If you choose to drink alcohol, do not have more than 2 drinks per day. One drink is considered to be 12 ounces (355 mL) of beer, 5 ounces (148 mL) of wine, or 1.5 ounces (44 mL) of liquor.  Avoid use of street  drugs. Do not share needles with anyone. Ask for help if you need support or instructions about stopping the use of drugs.  High blood pressure causes heart disease and increases the risk of stroke. Your blood pressure should be checked at least every 1-2 years. Ongoing high blood pressure should be treated with medicines, if weight loss and exercise are not effective.  If you are 16-37 years old, ask your health care provider if you should take aspirin to prevent heart disease.  Diabetes screening involves taking a blood sample to check your fasting blood sugar level. This should be done once every 3 years, after age 27, if you are within normal weight and without risk factors for diabetes. Testing should be considered at a younger age or be carried out more frequently if you are overweight and have at least 1 risk factor for diabetes.  Colorectal cancer can be detected and often prevented. Most routine colorectal cancer screening begins at the age of 72 and continues through age 37. However, your health care provider may recommend screening at an earlier age if you have risk factors for colon cancer. On a yearly basis, your health care provider may provide home test kits to check for hidden blood in the stool. Use of a small camera at the end of a tube to directly examine the colon (sigmoidoscopy or colonoscopy) can detect the earliest forms of colorectal cancer. Talk to your health care provider about this at age 24, when routine screening begins. Direct exam of the colon should be repeated every 5-10 years through age 6, unless early forms of precancerous polyps or small growths are found.  People who are at an increased risk for hepatitis B should be screened for this virus. You are considered at high risk for hepatitis B if:  You were born in a country where hepatitis B occurs often. Talk with your health care provider about which countries are considered high risk.  Your parents were born in a  high-risk country and you have not received a shot to protect against hepatitis B (hepatitis B vaccine).  You have HIV or AIDS.  You use needles to inject street drugs.  You live with, or have sex with, someone who has hepatitis B.  You are a man who has sex with other men (MSM).  You get hemodialysis treatment.  You take certain medicines for conditions such as cancer, organ transplantation, and autoimmune conditions.  Hepatitis C blood testing is recommended for all people born from [birth year] through 1965 and any individual with known risks for hepatitis C.  Practice safe sex. Use condoms and avoid high-risk sexual practices to reduce the spread of sexually transmitted infections (STIs). STIs include gonorrhea, chlamydia, syphilis, trichomonas, herpes, HPV, and human immunodeficiency virus (HIV). Herpes, HIV, and HPV are viral illnesses that have no cure. They can result in disability, cancer, and death.  If you are at risk of being infected with HIV, it  is recommended that you take a prescription medicine daily to prevent HIV infection. This is called preexposure prophylaxis (PrEP). You are considered at risk if:  You are a man who has sex with other men (MSM) and have other risk factors.  You are a heterosexual man, are sexually active, and are at increased risk for HIV infection.  You take drugs by injection.  You are sexually active with a partner who has HIV.  Talk with your health care provider about whether you are at high risk of being infected with HIV. If you choose to begin PrEP, you should first be tested for HIV. You should then be tested every 3 months for as long as you are taking PrEP.  A one-time screening for abdominal aortic aneurysm (AAA) and surgical repair of large AAAs by ultrasound are recommended for men ages 80 to 72 years who are current or former smokers.  Healthy men should no longer receive prostate-specific antigen (PSA) blood tests as part of routine  cancer screening. Talk with your health care provider about prostate cancer screening.  Testicular cancer screening is not recommended for adult males who have no symptoms. Screening includes self-exam, a health care provider exam, and other screening tests. Consult with your health care provider about any symptoms you have or any concerns you have about testicular cancer.  Use sunscreen. Apply sunscreen liberally and repeatedly throughout the day. You should seek shade when your shadow is shorter than you. Protect yourself by wearing long sleeves, pants, a wide-brimmed hat, and sunglasses year round, whenever you are outdoors.  Once a month, do a whole-body skin exam, using a mirror to look at the skin on your back. Tell your health care provider about new moles, moles that have irregular borders, moles that are larger than a pencil eraser, or moles that have changed in shape or color.  Stay current with required vaccines (immunizations).  Influenza vaccine. All adults should be immunized every year.  Tetanus, diphtheria, and acellular pertussis (Td, Tdap) vaccine. An adult who has not previously received Tdap or who does not know his vaccine status should receive 1 dose of Tdap. This initial dose should be followed by tetanus and diphtheria toxoids (Td) booster doses every 10 years. Adults with an unknown or incomplete history of completing a 3-dose immunization series with Td-containing vaccines should begin or complete a primary immunization series including a Tdap dose. Adults should receive a Td booster every 10 years.  Varicella vaccine. An adult without evidence of immunity to varicella should receive 2 doses or a second dose if he has previously received 1 dose.  Human papillomavirus (HPV) vaccine. Males aged 77-21 years who have not received the vaccine previously should receive the 3-dose series. Males aged 22-26 years may be immunized. Immunization is recommended through the age of 44  years for any male who has sex with males and did not get any or all doses earlier. Immunization is recommended for any person with an immunocompromised condition through the age of 37 years if he did not get any or all doses earlier. During the 3-dose series, the second dose should be obtained 4-8 weeks after the first dose. The third dose should be obtained 24 weeks after the first dose and 16 weeks after the second dose.  Zoster vaccine. One dose is recommended for adults aged 20 years or older unless certain conditions are present.  Measles, mumps, and rubella (MMR) vaccine. Adults born before [birth year] generally are considered immune to measles  and mumps. Adults born in [birth year] or later should have 1 or more doses of MMR vaccine unless there is a contraindication to the vaccine or there is laboratory evidence of immunity to each of the three diseases. A routine second dose of MMR vaccine should be obtained at least 28 days after the first dose for students attending postsecondary schools, health care workers, or international travelers. People who received inactivated measles vaccine or an unknown type of measles vaccine during 1963-1967 should receive 2 doses of MMR vaccine. People who received inactivated mumps vaccine or an unknown type of mumps vaccine before 1979 and are at high risk for mumps infection should consider immunization with 2 doses of MMR vaccine. Unvaccinated health care workers born before [birth year] who lack laboratory evidence of measles, mumps, or rubella immunity or laboratory confirmation of disease should consider measles and mumps immunization with 2 doses of MMR vaccine or rubella immunization with 1 dose of MMR vaccine.  Pneumococcal 13-valent conjugate (PCV13) vaccine. When indicated, a person who is uncertain of his immunization history and has no record of immunization should receive the PCV13 vaccine. An adult aged 41 years or older who has certain medical conditions and has not been  previously immunized should receive 1 dose of PCV13 vaccine. This PCV13 should be followed with a dose of pneumococcal polysaccharide (PPSV23) vaccine. The PPSV23 vaccine dose should be obtained at least 8 weeks after the dose of PCV13 vaccine. An adult aged 39 years or older who has certain medical conditions and previously received 1 or more doses of PPSV23 vaccine should receive 1 dose of PCV13. The PCV13 vaccine dose should be obtained 1 or more years after the last PPSV23 vaccine dose.  Pneumococcal polysaccharide (PPSV23) vaccine. When PCV13 is also indicated, PCV13 should be obtained first. All adults aged 84 years and older should be immunized. An adult younger than age 66 years who has certain medical conditions should be immunized. Any person who resides in a nursing home or long-term care facility should be immunized. An adult smoker should be immunized. People with an immunocompromised condition and certain other conditions should receive both PCV13 and PPSV23 vaccines. People with human immunodeficiency virus (HIV) infection should be immunized as soon as possible after diagnosis. Immunization during chemotherapy or radiation therapy should be avoided. Routine use of PPSV23 vaccine is not recommended for American Indians, [place] Natives, or people younger than 65 years unless there are medical conditions that require PPSV23 vaccine. When indicated, people who have unknown immunization and have no record of immunization should receive PPSV23 vaccine. One-time revaccination 5 years after the first dose of PPSV23 is recommended for people aged 19-64 years who have chronic kidney failure, nephrotic syndrome, asplenia, or immunocompromised conditions. People who received 1-2 doses of PPSV23 before age 43 years should receive another dose of PPSV23 vaccine at age 44 years or later if at least 5 years have passed since the previous dose. Doses of PPSV23 are not needed for people immunized with PPSV23 at or  after age 78 years.  Meningococcal vaccine. Adults with asplenia or persistent complement component deficiencies should receive 2 doses of quadrivalent meningococcal conjugate (MenACWY-D) vaccine. The doses should be obtained at least 2 months apart. Microbiologists working with certain meningococcal bacteria, [employer] recruits, people at risk during an outbreak, and people who travel to or live in countries with a high rate of meningitis should be immunized. A first-year college student up through age 35 years who is living in a residence hall  should receive a dose if he did not receive a dose on or after his 16th birthday. Adults who have certain high-risk conditions should receive one or more doses of vaccine.  Hepatitis A vaccine. Adults who wish to be protected from this disease, have certain high-risk conditions, work with hepatitis A-infected animals, work in hepatitis A research labs, or travel to or work in countries with a high rate of hepatitis A should be immunized. Adults who were previously unvaccinated and who anticipate close contact with an international adoptee during the first 60 days after arrival in the [place] from a country with a high rate of hepatitis A should be immunized.  Hepatitis B vaccine. Adults should be immunized if they wish to be protected from this disease, have certain high-risk conditions, may be exposed to blood or other infectious body fluids, are household contacts or sex partners of hepatitis B positive people, are clients or workers in certain care facilities, or travel to or work in countries with a high rate of hepatitis B.  Haemophilus influenzae type b (Hib) vaccine. A previously unvaccinated person with asplenia or sickle cell disease or having a scheduled splenectomy should receive 1 dose of Hib vaccine. Regardless of previous immunization, a recipient of a hematopoietic stem cell transplant should receive a 3-dose series 6-12 months after his  successful transplant. Hib vaccine is not recommended for adults with HIV infection. Preventive Service / Frequency Ages 52 to 17  Blood pressure check.** / Every 1 to 2 years.  Lipid and cholesterol check.** / Every 5 years beginning at age 57.  Hepatitis C blood test.** / For any individual with known risks for hepatitis C.  Skin self-exam. / Monthly.  Influenza vaccine. / Every year.  Tetanus, diphtheria, and acellular pertussis (Tdap, Td) vaccine.** / Consult your health care provider. 1 dose of Td every 10 years.  Varicella vaccine.** / Consult your health care provider.  HPV vaccine. / 3 doses over 6 months, if 45 or younger.  Measles, mumps, rubella (MMR) vaccine.** / You need at least 1 dose of MMR if you were born in 1957 or later. You may also need a second dose.  Pneumococcal 13-valent conjugate (PCV13) vaccine.** / Consult your health care provider.  Pneumococcal polysaccharide (PPSV23) vaccine.** / 1 to 2 doses if you smoke cigarettes or if you have certain conditions.  Meningococcal vaccine.** / 1 dose if you are age 11 to 66 years and a Market researcher living in a residence hall, or have one of several medical conditions. You may also need additional booster doses.  Hepatitis A vaccine.** / Consult your health care provider.  Hepatitis B vaccine.** / Consult your health care provider.  Haemophilus influenzae type b (Hib) vaccine.** / Consult your health care provider. Ages 34 to 15  Blood pressure check.** / Every 1 to 2 years.  Lipid and cholesterol check.** / Every 5 years beginning at age 65.  Lung cancer screening. / Every year if you are aged 44-80 years and have a 30-pack-year history of smoking and currently smoke or have quit within the past 15 years. Yearly screening is stopped once you have quit smoking for at least 15 years or develop a health problem that would prevent you from having lung cancer treatment.  Fecal occult blood test (FOBT)  of stool. / Every year beginning at age 8 and continuing until age 29. You may not have to do this test if you get a colonoscopy every 10 years.  Flexible  sigmoidoscopy** or colonoscopy.** / Every 5 years for a flexible sigmoidoscopy or every 10 years for a colonoscopy beginning at age 22 and continuing until age 47.  Hepatitis C blood test.** / For all people born from [birth year] through 1965 and any individual with known risks for hepatitis C.  Skin self-exam. / Monthly.  Influenza vaccine. / Every year.  Tetanus, diphtheria, and acellular pertussis (Tdap/Td) vaccine.** / Consult your health care provider. 1 dose of Td every 10 years.  Varicella vaccine.** / Consult your health care provider.  Zoster vaccine.** / 1 dose for adults aged 76 years or older.  Measles, mumps, rubella (MMR) vaccine.** / You need at least 1 dose of MMR if you were born in 1955 or later. You may also need a second dose.  Pneumococcal 13-valent conjugate (PCV13) vaccine.** / Consult your health care provider.  Pneumococcal polysaccharide (PPSV23) vaccine.** / 1 to 2 doses if you smoke cigarettes or if you have certain conditions.  Meningococcal vaccine.** / Consult your health care provider.  Hepatitis A vaccine.** / Consult your health care provider.  Hepatitis B vaccine.** / Consult your health care provider.  Haemophilus influenzae type b (Hib) vaccine.** / Consult your health care provider. Ages 61 and over  Blood pressure check.** / Every 1 to 2 years.  Lipid and cholesterol check.**/ Every 5 years beginning at age 52.  Lung cancer screening. / Every year if you are aged 50-80 years and have a 30-pack-year history of smoking and currently smoke or have quit within the past 15 years. Yearly screening is stopped once you have quit smoking for at least 15 years or develop a health problem that would prevent you from having lung cancer treatment.  Fecal occult blood test (FOBT) of stool. / Every year  beginning at age 22 and continuing until age 47. You may not have to do this test if you get a colonoscopy every 10 years.  Flexible sigmoidoscopy** or colonoscopy.** / Every 5 years for a flexible sigmoidoscopy or every 10 years for a colonoscopy beginning at age 50 and continuing until age 67.  Hepatitis C blood test.** / For all people born from [birth year] through 1963 and any individual with known risks for hepatitis C.  Abdominal aortic aneurysm (AAA) screening.** / A one-time screening for ages 13 to 40 years who are current or former smokers.  Skin self-exam. / Monthly.  Influenza vaccine. / Every year.  Tetanus, diphtheria, and acellular pertussis (Tdap/Td) vaccine.** / 1 dose of Td every 10 years.  Varicella vaccine.** / Consult your health care provider.  Zoster vaccine.** / 1 dose for adults aged 38 years or older.  Pneumococcal 13-valent conjugate (PCV13) vaccine.** / Consult your health care provider.  Pneumococcal polysaccharide (PPSV23) vaccine.** / 1 dose for all adults aged 20 years and older.  Meningococcal vaccine.** / Consult your health care provider.  Hepatitis A vaccine.** / Consult your health care provider.  Hepatitis B vaccine.** / Consult your health care provider.  Haemophilus influenzae type b (Hib) vaccine.** / Consult your health care provider. **Family history and personal history of risk and conditions may change your health care provider's recommendations. Document Released: 07/18/2001 Document Revised: 05/27/2013 Document Reviewed: 10/17/2010 [facility] Patient Information 2015 [employer], [place]. This information is not intended to replace advice given to you by your health care provider. Make sure you discuss any questions you have with your health care provider.

## 2014-09-17 ENCOUNTER — Other Ambulatory Visit: Payer: Self-pay

## 2014-09-17 MED ORDER — LEVOTHYROXINE SODIUM 112 MCG PO TABS: ORAL_TABLET | ORAL | Status: DC

## 2014-09-17 NOTE — Progress Notes (Signed)
Subjective:   Matthew May is a 69 y.o. male who presents for an Initial Medicare Annual Wellness Visit.  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  He lives at home with his wife who works at a Conservator, museum/gallery home.  He had CABG in 2011 and follows up with Dr. Percival Spanish at least yearly.  He denies any chest pain, states he has slight shortness of breath when he bends over.  He requests refills on several of his medications today, and after reviewing medication list I determined that patient is taking both zocor and lipitor.  After lab results from last visit, Evelina Dun stopped zocor and started lipitor per note.  Discussed this medication changed with patient and he expressed understanding.  Patient reports that he has fallen once this year when he tripped on next to last step on his porch.  States he does not feel unsteady when walking, this was an accident and does not usually have problems using stairs.   Review of Systems  Cardiac Risk Factors include: advanced age (>4men, >60 women);diabetes mellitus;dyslipidemia;hypertension;male gender    Objective:    Today's Vitals   09/16/14 1424  BP: 127/70  Pulse: 70  Height: 5\' 7"  (1.702 m)  Weight: 229 lb (103.874 kg)    Current Medications (verified) Outpatient Encounter Prescriptions as of 09/16/2014  Medication Sig  . aspirin 325 MG tablet Take 325 mg by mouth daily.    Marland Kitchen atorvastatin (LIPITOR) 40 MG tablet Take 1 tablet (40 mg total) by mouth daily.  Marland Kitchen glucose blood (ONE TOUCH ULTRA TEST) test strip Use to test daily  . levothyroxine (SYNTHROID, LEVOTHROID) 112 MCG tablet TAKE ONE TABLET BY MOUTH ONCE DAILY  . metFORMIN (GLUCOPHAGE) 1000 MG tablet TAKE ONE TABLET BY MOUTH TWICE DAILY WITH MEALS  . ONETOUCH DELICA LANCETS 52D MISC Use one daily  . potassium chloride SA (KLOR-CON M20) 20 MEQ tablet TAKE ONE TABLET BY  MOUTH ONCE DAILY  . simvastatin (ZOCOR) 40 MG tablet TAKE ONE TABLET BY MOUTH ONCE DAILY  . torsemide (DEMADEX) 20 MG tablet TAKE TWO TABLETS BY MOUTH ONCE DAILY  . Vitamin D, Ergocalciferol, (DRISDOL) 50000 UNITS CAPS capsule TAKE ONE CAPSULE BY MOUTH ONCE A WEEK  . [DISCONTINUED] glucose blood (ONE TOUCH ULTRA TEST) test strip Use to test daily  . [DISCONTINUED] ONETOUCH DELICA LANCETS 78E MISC Use one daily  . [DISCONTINUED] Vitamin D, Ergocalciferol, (DRISDOL) 50000 UNITS CAPS capsule TAKE ONE CAPSULE BY MOUTH ONCE A WEEK  . nitroGLYCERIN (NITROSTAT) 0.4 MG SL tablet Place 1 tablet (0.4 mg total) under the tongue every 5 (five) minutes as needed for chest pain.    Allergies (verified) Review of patient's allergies indicates no known allergies.   History: Past Medical History  Diagnosis Date  . HTN (hypertension)   . CAD (coronary artery disease)     a. s/p NSTEMI 4/11 => s/p CABG (L-LAD, S-OM2, S-PDA/PL);  b.  ETT-Myoview 6/14:  normal study, no ischemia, EF 67%  . Renal insufficiency   . COPD (chronic obstructive pulmonary disease)   . Obesity   . HLD (hyperlipidemia)   . Leg edema   . Hx of echocardiogram     Echo 6/14: Mod LVH, focal basal hypertrophy, EF 50-55%, mild AS (mean 17 mmHg) and mild AI, mild to mod LAE, mild RAE  . Carotid stenosis     Carotid  U/S 3/71:  RICA 0-62%, LICA 69-48%; right vertebral flow retrograde suggestive of steel-consider PV consult  . Subclavian artery stenosis, right     based upon carotid U/S done 11/2012   Past Surgical History  Procedure Laterality Date  . Hernia repair    . Tonsillectomy    . Coronary artery bypass graft      2011, LIMA to LAD coronary artery, SVG to OM2 branch of lect circumflex coronary artery, and a sequential SVG to psot descening to posterolateral branches to RCA  . Endoscopic vein harvesting      right leg    Family History  Problem Relation Age of Onset  . Diabetes Brother    Social History   Occupational  History  . Not on file.   Social History Main Topics  . Smoking status: Former Smoker    Quit date: 07/11/2009  . Smokeless tobacco: Never Used     Comment: smoked about 10 cig/day; used to smoke 2 ppd for many years   . Alcohol Use: No  . Drug Use: No  . Sexual Activity: Not on file   Tobacco Counseling Counseling given: Not Answered   Activities of Daily Living In your present state of health, do you have any difficulty performing the following activities: 09/16/2014  Hearing? N  Vision? Y  Difficulty concentrating or making decisions? N  Walking or climbing stairs? N  Dressing or bathing? N  Doing errands, shopping? N  Preparing Food and eating ? N  Using the Toilet? N  In the past six months, have you accidently leaked urine? N  Do you have problems with loss of bowel control? N  Managing your Medications? N  Managing your Finances? N  Housekeeping or managing your Housekeeping? N    Immunizations and Health Maintenance Immunization History  Administered Date(s) Administered  . Influenza, High Dose Seasonal PF 03/31/2014  . Influenza-Unspecified 02/03/2013   Health Maintenance Due  Topic Date Due  . OPHTHALMOLOGY EXAM  08/06/1957  . TETANUS/TDAP  08/07/1966  . COLONOSCOPY  08/06/1997  . ZOSTAVAX  08/07/2007  . PNA vac Low Risk Adult (1 of 2 - PCV13) 08/06/2012  . URINE MICROALBUMIN  05/23/2014  . FOOT EXAM  08/23/2014  . HEMOGLOBIN A1C  09/17/2014     Patient Care Team: Sharion Balloon, FNP as PCP - General (Nurse Practitioner)  Indicate any recent Medical Services you may have received from other than [facility] providers in the past year (date may be approximate).    Assessment:     Hearing/Vision screen Patient indicates that he has never had an eye exam as he did not know if it was covered by his insurance.  Made call to [facility] in [place], [place] - they do accept patient's insurance.  Called patient and gave him the information needed to make an  appointment there.   No hearing deficit noted     Dietary issues and exercise activities discussed: Current Exercise Habits:: The patient does not participate in regular exercise at present (patient states he is very active around home and walks around stores daily- for exercise)   Depression Screen PHQ 2/9 Scores 03/18/2014 12/16/2013  PHQ - 2 Score 0 0    Fall Risk Fall Risk  03/18/2014 12/16/2013  Falls in the past year? No No    Cognitive Function:   Screening Tests Health Maintenance  Topic Date Due  . OPHTHALMOLOGY EXAM  08/06/1957  . TETANUS/TDAP  08/07/1966  . COLONOSCOPY  08/06/1997  .  ZOSTAVAX  08/07/2007  . PNA vac Low Risk Adult (1 of 2 - PCV13) 08/06/2012  . URINE MICROALBUMIN  05/23/2014  . FOOT EXAM  08/23/2014  . HEMOGLOBIN A1C  09/17/2014  . INFLUENZA VACCINE  01/04/2015   Deferred colonoscopy appointment.  Deferred zostavax and tdap due to cost.   Prevnar 13 given today       Plan:   Follow up with Evelina Dun Monday 09/21/14 on chronic medical issues.  Continue current walking regimen for exercise.  Call [facility] office in [place] to schedule vision check- no referral needed, they accept [facility] Medicare Call dentist to schedule check up on gums, and to discuss getting bottom dentures replaced  During the course of the visit Sylvanus was educated and counseled about the following appropriate screening and preventive services:   Vaccines to include Pneumoccal, Influenza, Td, Zostavax  Colorectal cancer screening- deferred colonoscopy  Cardiovascular disease screening- sees Dr. Percival Spanish  Diabetes screening- scheduled follow up appointment with Evelina Dun, FNP 09/21/14  Glaucoma screening- information for [facility] center given - no referral needed for patient to make appointment, urged him to call for an appointment  Prostate cancer screening - up to date  Smoking cessation counseling- encouraged to continue with cessation   Patient  Instructions (the written plan) were given to the patient.   Laveah Gloster M, RN   09/17/2014       I have reviewed and agree with the above AWV documentation.  Claretta Fraise, M.D.

## 2014-09-21 ENCOUNTER — Ambulatory Visit: Payer: Medicare Other | Admitting: Family

## 2014-09-28 DIAGNOSIS — M79674 Pain in right toe(s): Secondary | ICD-10-CM | POA: Diagnosis not present

## 2014-09-28 DIAGNOSIS — M19071 Primary osteoarthritis, right ankle and foot: Secondary | ICD-10-CM | POA: Diagnosis not present

## 2014-09-30 ENCOUNTER — Ambulatory Visit: Payer: Self-pay | Admitting: Family

## 2014-10-16 ENCOUNTER — Other Ambulatory Visit: Payer: Self-pay

## 2014-10-16 MED ORDER — POTASSIUM CHLORIDE CRYS ER 20 MEQ PO TBCR: EXTENDED_RELEASE_TABLET | ORAL | Status: DC

## 2014-10-23 ENCOUNTER — Other Ambulatory Visit: Payer: Self-pay | Admitting: Family Medicine

## 2014-10-28 ENCOUNTER — Ambulatory Visit (INDEPENDENT_AMBULATORY_CARE_PROVIDER_SITE_OTHER): Payer: Self-pay | Admitting: Cardiology

## 2014-10-28 ENCOUNTER — Encounter: Payer: Self-pay | Admitting: Cardiology

## 2014-10-28 VITALS — BP 110/70 | HR 78 | Ht 67.5 in | Wt 227.0 lb

## 2014-10-28 DIAGNOSIS — E785 Hyperlipidemia, unspecified: Secondary | ICD-10-CM

## 2014-10-28 DIAGNOSIS — I2510 Atherosclerotic heart disease of native coronary artery without angina pectoris: Secondary | ICD-10-CM

## 2014-10-28 NOTE — Progress Notes (Signed)
HPI The patient returns for followup of his known coronary disease.  He has had an extensive workup in 2014 with a nuclear study an echo.  He was having some atypical chest pain at that point. He says that he is continuing to have this. However, he's not sure if it's the same. It is some right-sided pain. It is sporadic. He can do activities such as working in his garden without bringing on any of these symptoms. He's not describing any substernal chest pressure, neck or arm discomfort. Is not having any palpitations, presyncope or syncope. He's had no PND or orthopnea.   No Known Allergies  Current Outpatient Prescriptions  Medication Sig Dispense Refill  . aspirin 325 MG tablet Take 325 mg by mouth daily.      Marland Kitchen atorvastatin (LIPITOR) 40 MG tablet Take 1 tablet (40 mg total) by mouth daily. 90 tablet 3  . HYDROcodone-acetaminophen (NORCO/VICODIN) 5-325 MG per tablet Take 1 tablet by mouth every 6 (six) hours as needed for moderate pain (gout).     Marland Kitchen levothyroxine (SYNTHROID, LEVOTHROID) 112 MCG tablet TAKE ONE TABLET BY MOUTH ONCE DAILY 30 tablet 5  . metFORMIN (GLUCOPHAGE) 1000 MG tablet TAKE ONE TABLET BY MOUTH TWICE DAILY WITH MEALS NEEDS  LABS 60 tablet 0  . potassium chloride SA (KLOR-CON M20) 20 MEQ tablet TAKE ONE TABLET BY MOUTH ONCE DAILY 30 tablet 1  . torsemide (DEMADEX) 20 MG tablet TAKE TWO TABLETS BY MOUTH ONCE DAILY 180 tablet 1  . Vitamin D, Ergocalciferol, (DRISDOL) 50000 UNITS CAPS capsule TAKE ONE CAPSULE BY MOUTH ONCE A WEEK 12 capsule 0  . glucose blood (ONE TOUCH ULTRA TEST) test strip Use to test daily 100 each 3  . nitroGLYCERIN (NITROSTAT) 0.4 MG SL tablet Place 1 tablet (0.4 mg total) under the tongue every 5 (five) minutes as needed for chest pain. 25 tablet 3  . ONETOUCH DELICA LANCETS 32G MISC Use one daily 100 each 3   No current facility-administered medications for this visit.    Past Medical History  Diagnosis Date  . HTN (hypertension)   . CAD (coronary  artery disease)     a. s/p NSTEMI 4/11 => s/p CABG (L-LAD, S-OM2, S-PDA/PL);  b.  ETT-Myoview 6/14:  normal study, no ischemia, EF 67%  . Renal insufficiency   . COPD (chronic obstructive pulmonary disease)   . Obesity   . HLD (hyperlipidemia)   . Leg edema   . Hx of echocardiogram     Echo 6/14: Mod LVH, focal basal hypertrophy, EF 50-55%, mild AS (mean 17 mmHg) and mild AI, mild to mod LAE, mild RAE  . Carotid stenosis     Carotid U/S 4/01:  RICA 0-27%, LICA 25-36%; right vertebral flow retrograde suggestive of steel-consider PV consult  . Subclavian artery stenosis, right     based upon carotid U/S done 11/2012    Past Surgical History  Procedure Laterality Date  . Hernia repair    . Tonsillectomy    . Coronary artery bypass graft      2011, LIMA to LAD coronary artery, SVG to OM2 branch of lect circumflex coronary artery, and a sequential SVG to psot descening to posterolateral branches to RCA  . Endoscopic vein harvesting      right leg     ROS:  As stated in the HPI and negative for all other systems.  PHYSICAL EXAM BP 110/70 mmHg  Pulse 78  Ht 5' 7.5" (1.715 m)  Wt 227 lb (  102.967 kg)  BMI 35.01 kg/m2 GENERAL:  Well appearing HEENT:  Pupils equal round and reactive, fundi not visualized, oral mucosa unremarkable NECK:  No jugular venous distention, waveform within normal limits, carotid upstroke brisk and symmetric, no bruits, no thyromegaly LUNGS:  Clear to auscultation bilaterally BACK:  No CVA tenderness CHEST:  Well healed sternotomy scar. HEART:  PMI not displaced or sustained,S1 and S2 within normal limits, no S3, no S4, no clicks, no rubs, soft right upper sternal border murmur early peaking ABD:  Flat, positive bowel sounds normal in frequency in pitch, no bruits, no rebound, no guarding, no midline pulsatile mass, no hepatomegaly, no splenomegaly, obese EXT:  2 plus pulses throughout, moderate edema, no cyanosis no clubbing   EKG: Sinus rhythm, rate 78, axis  within normal limits, intervals within normal limits, no acute ST-T wave changes.  10/28/2014   ASSESSMENT AND PLAN  CORONARY ATHEROSCLEROSIS NATIVE CORONARY ARTERY - He's had no new symptoms consistent with angina since his stress test in 2014.   We will reduce the ASA to 81 mg.    HYPERTENSION -  The blood pressure is at target.  He will remain on the meds as listed.   DYSLIPIDEMIA -   I will have him come back for a fasting lipid profile.   If he is still not at target and he'll need to be on 80 mg of Lipitor.  AS - This is mild and we can follow this clinically.

## 2014-10-28 NOTE — Patient Instructions (Signed)
Medication Instructions:  Your physician recommends that you continue on your current medications as directed. Please refer to the Current Medication list given to you today.  Labwork: Please have fasting blood work drawn at [facility] (lipid).  Follow-Up: Follow up in 1 year with Dr. Percival Spanish in [place].  You will receive a letter in the mail 2 months before you are due.  Please call us when you receive this letter to schedule your follow up appointment.  Thank you for choosing [facility]!!

## 2014-10-29 ENCOUNTER — Other Ambulatory Visit (INDEPENDENT_AMBULATORY_CARE_PROVIDER_SITE_OTHER): Payer: Medicare Other

## 2014-10-29 DIAGNOSIS — E785 Hyperlipidemia, unspecified: Secondary | ICD-10-CM | POA: Diagnosis not present

## 2014-10-29 NOTE — Progress Notes (Signed)
Lab only 

## 2014-10-30 LAB — LIPID PANEL
Chol/HDL Ratio: 4.4 ratio units (ref 0.0–5.0)
Cholesterol, Total: 142 mg/dL (ref 100–199)
HDL: 32 mg/dL — ABNORMAL LOW (ref >39)
LDL Calculated: 83 mg/dL (ref 0–99)
Triglycerides: 136 mg/dL (ref 0–149)
VLDL Cholesterol Cal: 27 mg/dL (ref 5–40)

## 2014-11-05 ENCOUNTER — Ambulatory Visit (INDEPENDENT_AMBULATORY_CARE_PROVIDER_SITE_OTHER): Payer: Medicare Other | Admitting: Family

## 2014-11-05 ENCOUNTER — Encounter: Payer: Self-pay | Admitting: Family

## 2014-11-05 VITALS — BP 135/80 | HR 85 | Temp 97.7°F | Ht 67.5 in | Wt 225.4 lb

## 2014-11-05 DIAGNOSIS — E1165 Type 2 diabetes mellitus with hyperglycemia: Secondary | ICD-10-CM | POA: Diagnosis not present

## 2014-11-05 DIAGNOSIS — E039 Hypothyroidism, unspecified: Secondary | ICD-10-CM

## 2014-11-05 DIAGNOSIS — E559 Vitamin D deficiency, unspecified: Secondary | ICD-10-CM

## 2014-11-05 DIAGNOSIS — E785 Hyperlipidemia, unspecified: Secondary | ICD-10-CM | POA: Diagnosis not present

## 2014-11-05 DIAGNOSIS — I2510 Atherosclerotic heart disease of native coronary artery without angina pectoris: Secondary | ICD-10-CM | POA: Diagnosis not present

## 2014-11-05 LAB — POCT GLYCOSYLATED HEMOGLOBIN (HGB A1C): Hemoglobin A1C: 6.9

## 2014-11-05 LAB — POCT UA - MICROALBUMIN: Microalbumin Ur, POC: 20 mg/L

## 2014-11-05 NOTE — Addendum Note (Signed)
Addended by: Earlene Plater on: 11/05/2014 04:06 PM   Modules accepted: Orders

## 2014-11-05 NOTE — Patient Instructions (Signed)

## 2014-11-05 NOTE — Progress Notes (Signed)
Subjective:    Patient ID: Matthew May, male    DOB: [birth year], 69 y.o.   MRN: [id number]  Diabetes He presents for his follow-up diabetic visit. He has type 2 diabetes mellitus. His disease course has been stable. Pertinent negatives for hypoglycemia include no confusion or dizziness. There are no diabetic associated symptoms. Pertinent negatives for diabetes include no blurred vision, no foot paresthesias, no foot ulcerations and no visual change. Pertinent negatives for hypoglycemia complications include no blackouts. Symptoms are stable. Diabetic complications include heart disease. Pertinent negatives for diabetic complications include no CVA, nephropathy or peripheral neuropathy. Risk factors for coronary artery disease include diabetes mellitus, family history, dyslipidemia, hypertension, male sex and post-menopausal. Current diabetic treatment includes oral agent (monotherapy). He is compliant with treatment all of the time. He is following a diabetic diet. His breakfast blood glucose range is generally 110-130 mg/dl. An ACE inhibitor/angiotensin II receptor blocker is not being taken. Eye exam is not current.  Hyperlipidemia This is a chronic problem. The current episode started more than 1 year ago. The problem is uncontrolled. Recent lipid tests were reviewed and are high. Exacerbating diseases include diabetes and hypothyroidism. Pertinent negatives include no leg pain or myalgias. Current antihyperlipidemic treatment includes statins. The current treatment provides moderate improvement of lipids. Risk factors for coronary artery disease include dyslipidemia, diabetes mellitus, family history, hypertension, male sex and obesity.  Thyroid Problem Presents for follow-up visit. Patient reports no constipation, depressed mood, diarrhea, heat intolerance, nail problem or visual change. The symptoms have been stable. Past treatments include levothyroxine. The treatment provided significant  relief. His past medical history is significant for diabetes and hyperlipidemia.   *Pt is followed by Cardiologists. Pt had "open heart surgery" in May 2011.    Review of Systems  Constitutional: Negative.   HENT: Negative.   Eyes: Negative for blurred vision.  Respiratory: Negative.   Cardiovascular: Negative.   Gastrointestinal: Negative.  Negative for diarrhea and constipation.  Endocrine: Negative.  Negative for heat intolerance.  Genitourinary: Negative.   Musculoskeletal: Negative.  Negative for myalgias.  Neurological: Negative.  Negative for dizziness.  Hematological: Negative.   Psychiatric/Behavioral: Negative.  Negative for confusion.  All other systems reviewed and are negative.      Objective:   Physical Exam  Constitutional: He is oriented to person, place, and time. He appears well-developed and well-nourished. No distress.  HENT:  Head: Normocephalic.  Right Ear: External ear normal.  Left Ear: External ear normal.  Nose: Nose normal.  Mouth/Throat: Oropharynx is clear and moist.  Eyes: Pupils are equal, round, and reactive to light. Right eye exhibits no discharge. Left eye exhibits no discharge.  Neck: Normal range of motion. Neck supple. No thyromegaly present.  Cardiovascular: Normal rate, regular rhythm, normal heart sounds and intact distal pulses.   No murmur heard. Pulmonary/Chest: Effort normal and breath sounds normal. No respiratory distress. He has no wheezes.  Abdominal: Soft. Bowel sounds are normal. He exhibits no distension. There is no tenderness.  Musculoskeletal: Normal range of motion. He exhibits no edema or tenderness.  Neurological: He is alert and oriented to person, place, and time. He has normal reflexes. No cranial nerve deficit.  Skin: Skin is warm and dry. No rash noted. No erythema.  Psychiatric: He has a normal mood and affect. His behavior is normal. Judgment and thought content normal.  Vitals reviewed.   BP 135/80 mmHg   Pulse 85  Temp(Src) 97.7 F (36.5 C) (Oral)  Ht 5' 7.5" (  1.715 m)  Wt 225 lb 6.4 oz (102.241 kg)  BMI 34.76 kg/m2       Assessment & Plan:  1. Atherosclerosis of native coronary artery of native heart without angina pectoris - CMP14+EGFR  2. Type 2 diabetes mellitus with hyperglycemia - POCT glycosylated hemoglobin (Hb A1C) - POCT UA - Microalbumin - CMP14+EGFR  3. Hypothyroidism, unspecified hypothyroidism type - CMP14+EGFR - Thyroid Panel With TSH  4. Hyperlipidemia - CMP14+EGFR  5. Vitamin D deficiency - CMP14+EGFR - Vit D  25 hydroxy (rtn osteoporosis monitoring)   Continue all meds Labs pending Health Maintenance reviewed Diet and exercise encouraged RTO 6 months  Evelina Dun, FNP

## 2014-11-06 ENCOUNTER — Other Ambulatory Visit: Payer: Self-pay | Admitting: Family

## 2014-11-06 ENCOUNTER — Telehealth: Payer: Self-pay | Admitting: *Deleted

## 2014-11-06 LAB — CMP14+EGFR
ALT: 32 IU/L (ref 0–44)
AST: 23 IU/L (ref 0–40)
Albumin/Globulin Ratio: 1.6 (ref 1.1–2.5)
Albumin: 4.4 g/dL (ref 3.6–4.8)
Alkaline Phosphatase: 78 IU/L (ref 39–117)
BUN/Creatinine Ratio: 16 (ref 10–22)
BUN: 16 mg/dL (ref 8–27)
Bilirubin Total: 0.4 mg/dL (ref 0.0–1.2)
CO2: 25 mmol/L (ref 18–29)
Calcium: 9.5 mg/dL (ref 8.6–10.2)
Chloride: 98 mmol/L (ref 97–108)
Creatinine, Ser: 1.02 mg/dL (ref 0.76–1.27)
GFR calc Af Amer: 88 mL/min/{1.73_m2} (ref >59)
GFR calc non Af Amer: 76 mL/min/{1.73_m2} (ref >59)
Globulin, Total: 2.7 g/dL (ref 1.5–4.5)
Glucose: 160 mg/dL — ABNORMAL HIGH (ref 65–99)
Potassium: 4 mmol/L (ref 3.5–5.2)
Sodium: 142 mmol/L (ref 134–144)
Total Protein: 7.1 g/dL (ref 6.0–8.5)

## 2014-11-06 LAB — MICROALBUMIN, URINE: Microalbumin, Urine: <3 ug/mL

## 2014-11-06 LAB — THYROID PANEL WITH TSH
Free Thyroxine Index: 2.3 (ref 1.2–4.9)
T3 Uptake Ratio: 29 % (ref 24–39)
T4, Total: 8 ug/dL (ref 4.5–12.0)
TSH: 2.13 u[IU]/mL (ref 0.450–4.500)

## 2014-11-06 LAB — VITAMIN D 25 HYDROXY (VIT D DEFICIENCY, FRACTURES): Vit D, 25-Hydroxy: 47 ng/mL (ref 30.0–100.0)

## 2014-11-06 MED ORDER — LISINOPRIL 10 MG PO TABS: 10.0000 mg | ORAL_TABLET | Freq: Every day | ORAL | Status: DC

## 2014-11-06 NOTE — Telephone Encounter (Signed)
-----   Message from Sharion Balloon, [place] sent at 11/06/2014 10:18 AM EDT ----- Microalbumin in urine-  Lisinopril 10 mg sent to pharmacy- Pt needs good control over blood glucose and HTN HgbA1C - WNL Kidney and liver function stable Thyroid levels WNL Vit D levels WNL

## 2014-11-11 ENCOUNTER — Other Ambulatory Visit: Payer: Self-pay | Admitting: Family Medicine

## 2014-12-17 ENCOUNTER — Other Ambulatory Visit: Payer: Self-pay | Admitting: *Deleted

## 2014-12-17 MED ORDER — POTASSIUM CHLORIDE CRYS ER 20 MEQ PO TBCR: EXTENDED_RELEASE_TABLET | ORAL | Status: DC

## 2015-01-12 ENCOUNTER — Other Ambulatory Visit: Payer: Self-pay | Admitting: Family

## 2015-03-12 ENCOUNTER — Other Ambulatory Visit: Payer: Self-pay | Admitting: Family Medicine

## 2015-03-13 NOTE — Telephone Encounter (Signed)
Sent X 3 months.   Controlled A1C with kidney function checked in June.   Laroy Apple, MD [facility] 03/13/2015, 11:11 AM

## 2015-03-17 ENCOUNTER — Other Ambulatory Visit: Payer: Self-pay | Admitting: Family

## 2015-03-17 ENCOUNTER — Other Ambulatory Visit: Payer: Self-pay | Admitting: Family Medicine

## 2015-05-18 ENCOUNTER — Other Ambulatory Visit: Payer: Self-pay | Admitting: Family

## 2015-05-19 NOTE — Telephone Encounter (Signed)
Last seen and last Vit D 11/05/14  Christy   47.0

## 2015-06-11 ENCOUNTER — Ambulatory Visit: Payer: Medicare Other | Admitting: Family

## 2015-06-16 ENCOUNTER — Other Ambulatory Visit: Payer: Self-pay | Admitting: Family Medicine

## 2015-06-16 ENCOUNTER — Other Ambulatory Visit: Payer: Self-pay | Admitting: Family

## 2015-06-16 NOTE — Telephone Encounter (Signed)
Last seen 11/05/14  Matthew May

## 2015-06-18 ENCOUNTER — Other Ambulatory Visit: Payer: Self-pay | Admitting: Family Medicine

## 2015-07-06 ENCOUNTER — Telehealth: Payer: Self-pay | Admitting: Family

## 2015-08-10 ENCOUNTER — Encounter: Payer: Medicare Other | Admitting: Family

## 2015-08-11 ENCOUNTER — Encounter: Payer: Self-pay | Admitting: Family

## 2015-08-23 ENCOUNTER — Ambulatory Visit (INDEPENDENT_AMBULATORY_CARE_PROVIDER_SITE_OTHER): Payer: Medicare Other | Admitting: Family

## 2015-08-23 ENCOUNTER — Encounter: Payer: Self-pay | Admitting: Family

## 2015-08-23 VITALS — BP 109/65 | HR 74 | Temp 97.1°F | Ht 67.5 in | Wt 241.6 lb

## 2015-08-23 DIAGNOSIS — R0789 Other chest pain: Secondary | ICD-10-CM

## 2015-08-23 DIAGNOSIS — R600 Localized edema: Secondary | ICD-10-CM

## 2015-08-23 DIAGNOSIS — E039 Hypothyroidism, unspecified: Secondary | ICD-10-CM

## 2015-08-23 DIAGNOSIS — Z1211 Encounter for screening for malignant neoplasm of colon: Secondary | ICD-10-CM

## 2015-08-23 DIAGNOSIS — E559 Vitamin D deficiency, unspecified: Secondary | ICD-10-CM

## 2015-08-23 DIAGNOSIS — E1165 Type 2 diabetes mellitus with hyperglycemia: Secondary | ICD-10-CM | POA: Diagnosis not present

## 2015-08-23 DIAGNOSIS — E785 Hyperlipidemia, unspecified: Secondary | ICD-10-CM | POA: Diagnosis not present

## 2015-08-23 DIAGNOSIS — I2510 Atherosclerotic heart disease of native coronary artery without angina pectoris: Secondary | ICD-10-CM

## 2015-08-23 DIAGNOSIS — Z1159 Encounter for screening for other viral diseases: Secondary | ICD-10-CM

## 2015-08-23 LAB — BAYER DCA HB A1C WAIVED: HB A1C (BAYER DCA - WAIVED): 7.6 % — ABNORMAL HIGH (ref <7.0)

## 2015-08-23 MED ORDER — NITROGLYCERIN 0.4 MG SL SUBL: 0.4000 mg | SUBLINGUAL_TABLET | SUBLINGUAL | Status: DC | PRN

## 2015-08-23 NOTE — Progress Notes (Signed)
Subjective:    Patient ID: Matthew May, male    DOB: [birth year], 70 y.o.   MRN: [id number]  Pt presents to the office today for chronic follow up. Pt is followed by Cardiologists, but states he has not seen him in "about a year". Pt encouraged to make follow up appt.  Pt had "open heart surgery" in May 2011.  Diabetes He presents for his follow-up diabetic visit. He has type 2 diabetes mellitus. His disease course has been stable. Pertinent negatives for hypoglycemia include no confusion or dizziness. There are no diabetic associated symptoms. Pertinent negatives for diabetes include no blurred vision, no foot paresthesias, no foot ulcerations and no visual change. Pertinent negatives for hypoglycemia complications include no blackouts. Symptoms are stable. Diabetic complications include heart disease. Pertinent negatives for diabetic complications include no CVA, nephropathy or peripheral neuropathy. Risk factors for coronary artery disease include diabetes mellitus, family history, dyslipidemia, hypertension, male sex and post-menopausal. Current diabetic treatment includes oral agent (monotherapy). He is compliant with treatment all of the time. He is following a diabetic diet. His breakfast blood glucose range is generally 110-130 mg/dl. An ACE inhibitor/angiotensin II receptor blocker is not being taken. Eye exam is not current.  Hyperlipidemia This is a chronic problem. The current episode started more than 1 year ago. The problem is controlled. Recent lipid tests were reviewed and are normal. Exacerbating diseases include diabetes and hypothyroidism. Pertinent negatives include no leg pain or myalgias. Current antihyperlipidemic treatment includes statins. The current treatment provides moderate improvement of lipids. Risk factors for coronary artery disease include dyslipidemia, diabetes mellitus, family history, hypertension, male sex and obesity.  Thyroid Problem Presents for follow-up  visit. Patient reports no constipation, depressed mood, diarrhea, heat intolerance, nail problem or visual change. The symptoms have been stable. Past treatments include levothyroxine. The treatment provided significant relief. His past medical history is significant for diabetes and hyperlipidemia.      Review of Systems  Constitutional: Negative.   HENT: Negative.   Eyes: Negative for blurred vision.  Respiratory: Negative.   Cardiovascular: Negative.   Gastrointestinal: Negative.  Negative for diarrhea and constipation.  Endocrine: Negative.  Negative for heat intolerance.  Genitourinary: Negative.   Musculoskeletal: Negative.  Negative for myalgias.  Neurological: Negative.  Negative for dizziness.  Hematological: Negative.   Psychiatric/Behavioral: Negative.  Negative for confusion.  All other systems reviewed and are negative.      Objective:   Physical Exam  Constitutional: He is oriented to person, place, and time. He appears well-developed and well-nourished. No distress.  HENT:  Head: Normocephalic.  Right Ear: External ear normal.  Left Ear: External ear normal.  Nose: Nose normal.  Mouth/Throat: Oropharynx is clear and moist.  Eyes: Pupils are equal, round, and reactive to light. Right eye exhibits no discharge. Left eye exhibits no discharge.  Neck: Normal range of motion. Neck supple. No thyromegaly present.  Cardiovascular: Normal rate, regular rhythm and intact distal pulses.   Murmur heard. Pulmonary/Chest: Effort normal. No respiratory distress.  Diminished breath sounds   Abdominal: Soft. Bowel sounds are normal. He exhibits no distension. There is no tenderness.  Musculoskeletal: Normal range of motion. He exhibits no edema or tenderness.  Neurological: He is alert and oriented to person, place, and time. He has normal reflexes. No cranial nerve deficit.  Skin: Skin is warm and dry. No rash noted. No erythema.  Psychiatric: He has a normal mood and  affect. His behavior is normal. Judgment and thought content  normal.  Vitals reviewed.   BP 109/65 mmHg  Pulse 74  Temp(Src) 97.1 F (36.2 C) (Oral)  Ht 5' 7.5" (1.715 m)  Wt 241 lb 9.6 oz (109.589 kg)  BMI 37.26 kg/m2       Assessment & Plan:  1. Hypothyroidism, unspecified hypothyroidism type - CMP14+EGFR - Thyroid Panel With TSH  2. Type 2 diabetes mellitus with hyperglycemia, without long-term current use of insulin (HCC) - CMP14+EGFR - Microalbumin / creatinine urine ratio - Bayer DCA Hb A1c Waived - Ambulatory referral to Ophthalmology  3. Vitamin D deficiency - CMP14+EGFR  4. Hyperlipidemia - CMP14+EGFR - Lipid panel  5. Atherosclerosis of native coronary artery of native heart without angina pectoris - CMP14+EGFR  6. Localized edema - CMP14+EGFR  7. Need for hepatitis C screening test - CMP14+EGFR - Hepatitis C antibody  8. Atypical chest pain  - CMP14+EGFR - nitroGLYCERIN (NITROSTAT) 0.4 MG SL tablet; Place 1 tablet (0.4 mg total) under the tongue every 5 (five) minutes as needed for chest pain.  Dispense: 25 tablet; Refill: 3  9. Colon cancer screening - CMP14+EGFR - Fecal occult blood, imunochemical; Future   Continue all meds, pt needs appt with Cardiologists  Labs pending Health Maintenance reviewed Diet and exercise encouraged RTO 3 months  Evelina Dun, FNP

## 2015-08-23 NOTE — Patient Instructions (Signed)

## 2015-08-24 ENCOUNTER — Telehealth: Payer: Self-pay

## 2015-08-24 ENCOUNTER — Other Ambulatory Visit: Payer: Self-pay | Admitting: *Deleted

## 2015-08-24 DIAGNOSIS — R609 Edema, unspecified: Secondary | ICD-10-CM

## 2015-08-24 LAB — LIPID PANEL
Chol/HDL Ratio: 4.5 ratio units (ref 0.0–5.0)
Cholesterol, Total: 154 mg/dL (ref 100–199)
HDL: 34 mg/dL — ABNORMAL LOW (ref >39)
LDL Calculated: 93 mg/dL (ref 0–99)
Triglycerides: 136 mg/dL (ref 0–149)
VLDL Cholesterol Cal: 27 mg/dL (ref 5–40)

## 2015-08-24 LAB — CMP14+EGFR
ALT: 33 IU/L (ref 0–44)
AST: 21 IU/L (ref 0–40)
Albumin/Globulin Ratio: 1.6 (ref 1.2–2.2)
Albumin: 4.5 g/dL (ref 3.6–4.8)
Alkaline Phosphatase: 82 IU/L (ref 39–117)
BUN/Creatinine Ratio: 20 (ref 10–22)
BUN: 21 mg/dL (ref 8–27)
Bilirubin Total: 0.5 mg/dL (ref 0.0–1.2)
CO2: 23 mmol/L (ref 18–29)
Calcium: 9.5 mg/dL (ref 8.6–10.2)
Chloride: 94 mmol/L — ABNORMAL LOW (ref 96–106)
Creatinine, Ser: 1.04 mg/dL (ref 0.76–1.27)
GFR calc Af Amer: 85 mL/min/{1.73_m2} (ref >59)
GFR calc non Af Amer: 73 mL/min/{1.73_m2} (ref >59)
Globulin, Total: 2.9 g/dL (ref 1.5–4.5)
Glucose: 170 mg/dL — ABNORMAL HIGH (ref 65–99)
Potassium: 4.5 mmol/L (ref 3.5–5.2)
Sodium: 137 mmol/L (ref 134–144)
Total Protein: 7.4 g/dL (ref 6.0–8.5)

## 2015-08-24 LAB — HCV ANTIBODY: Hep C Virus Ab: 0.1 s/co ratio (ref 0.0–0.9)

## 2015-08-24 LAB — MICROALBUMIN / CREATININE URINE RATIO
Creatinine, Urine: 45.6 mg/dL
MICROALB/CREAT RATIO: <6.6 mg/g creat (ref 0.0–30.0)
Microalbumin, Urine: <3 ug/mL

## 2015-08-24 LAB — THYROID PANEL WITH TSH
Free Thyroxine Index: 2.4 (ref 1.2–4.9)
T3 Uptake Ratio: 28 % (ref 24–39)
T4, Total: 8.6 ug/dL (ref 4.5–12.0)
TSH: 6.23 u[IU]/mL — ABNORMAL HIGH (ref 0.450–4.500)

## 2015-08-24 MED ORDER — TORSEMIDE 20 MG PO TABS: ORAL_TABLET | ORAL | Status: DC

## 2015-08-24 MED ORDER — LEVOTHYROXINE SODIUM 112 MCG PO TABS: 112.0000 ug | ORAL_TABLET | Freq: Every day | ORAL | Status: DC

## 2015-08-24 MED ORDER — POTASSIUM CHLORIDE CRYS ER 20 MEQ PO TBCR: EXTENDED_RELEASE_TABLET | ORAL | Status: DC

## 2015-08-24 MED ORDER — ATORVASTATIN CALCIUM 40 MG PO TABS: 40.0000 mg | ORAL_TABLET | Freq: Every day | ORAL | Status: DC

## 2015-08-24 MED ORDER — LISINOPRIL 10 MG PO TABS: 10.0000 mg | ORAL_TABLET | Freq: Every day | ORAL | Status: DC

## 2015-08-24 NOTE — Telephone Encounter (Signed)
rx refill

## 2015-08-25 ENCOUNTER — Other Ambulatory Visit: Payer: Medicare Other

## 2015-08-25 DIAGNOSIS — Z1211 Encounter for screening for malignant neoplasm of colon: Secondary | ICD-10-CM | POA: Diagnosis not present

## 2015-08-26 ENCOUNTER — Other Ambulatory Visit: Payer: Self-pay | Admitting: Family

## 2015-08-26 MED ORDER — LEVOTHYROXINE SODIUM 125 MCG PO TABS: 125.0000 ug | ORAL_TABLET | Freq: Every day | ORAL | Status: DC

## 2015-08-26 MED ORDER — METFORMIN HCL 1000 MG PO TABS: ORAL_TABLET | ORAL | Status: DC

## 2015-08-27 ENCOUNTER — Other Ambulatory Visit: Payer: Self-pay

## 2015-08-27 MED ORDER — POTASSIUM CHLORIDE CRYS ER 20 MEQ PO TBCR: EXTENDED_RELEASE_TABLET | ORAL | Status: DC

## 2015-08-28 LAB — FECAL OCCULT BLOOD, IMMUNOCHEMICAL: Fecal Occult Bld: NEGATIVE

## 2015-09-08 ENCOUNTER — Other Ambulatory Visit: Payer: Self-pay

## 2015-09-08 MED ORDER — LEVOTHYROXINE SODIUM 125 MCG PO TABS: 125.0000 ug | ORAL_TABLET | Freq: Every day | ORAL | Status: DC

## 2015-09-08 MED ORDER — VITAMIN D (ERGOCALCIFEROL) 1.25 MG (50000 UNIT) PO CAPS: 50000.0000 [IU] | ORAL_CAPSULE | ORAL | Status: DC

## 2015-09-13 ENCOUNTER — Other Ambulatory Visit: Payer: Self-pay | Admitting: *Deleted

## 2015-09-13 DIAGNOSIS — R609 Edema, unspecified: Secondary | ICD-10-CM

## 2015-09-13 DIAGNOSIS — E119 Type 2 diabetes mellitus without complications: Secondary | ICD-10-CM

## 2015-09-13 MED ORDER — ONETOUCH DELICA LANCETS 33G MISC: Status: DC

## 2015-09-13 MED ORDER — TORSEMIDE 20 MG PO TABS: ORAL_TABLET | ORAL | Status: DC

## 2015-09-13 MED ORDER — METFORMIN HCL 1000 MG PO TABS: ORAL_TABLET | ORAL | Status: DC

## 2015-09-13 MED ORDER — ATORVASTATIN CALCIUM 40 MG PO TABS: 40.0000 mg | ORAL_TABLET | Freq: Every day | ORAL | Status: DC

## 2015-09-13 MED ORDER — GLUCOSE BLOOD VI STRP: ORAL_STRIP | Status: DC

## 2015-10-06 ENCOUNTER — Ambulatory Visit (INDEPENDENT_AMBULATORY_CARE_PROVIDER_SITE_OTHER): Payer: Medicare Other | Admitting: Cardiology

## 2015-10-06 ENCOUNTER — Encounter: Payer: Self-pay | Admitting: Cardiology

## 2015-10-06 VITALS — BP 119/63 | HR 74 | Ht 67.5 in | Wt 243.0 lb

## 2015-10-06 DIAGNOSIS — I6523 Occlusion and stenosis of bilateral carotid arteries: Secondary | ICD-10-CM | POA: Diagnosis not present

## 2015-10-06 DIAGNOSIS — R0602 Shortness of breath: Secondary | ICD-10-CM

## 2015-10-06 NOTE — Progress Notes (Signed)
HPI The patient returns for followup of his known coronary disease.  He has had an extensive workup in 2014 with a nuclear study an echo.   He returns for routine follow-up. He does say that he occasionally gets some chest discomfort but he thinks this pattern is similar to previous. Once in a while he did take a nitroglycerin but this not usual.  He does get some SOB with moderate activity.  He denies PND or orthopnea.  He has put on about 15 lbs since I last saw him.    No Known Allergies  Current Outpatient Prescriptions  Medication Sig Dispense Refill  . aspirin EC 81 MG tablet Take 81 mg by mouth daily.    Marland Kitchen atorvastatin (LIPITOR) 40 MG tablet Take 1 tablet (40 mg total) by mouth daily. 90 tablet 1  . levothyroxine (SYNTHROID, LEVOTHROID) 125 MCG tablet Take 1 tablet (125 mcg total) by mouth daily. 90 tablet 1  . lisinopril (PRINIVIL) 10 MG tablet Take 1 tablet (10 mg total) by mouth daily. 90 tablet 1  . metFORMIN (GLUCOPHAGE) 1000 MG tablet TAKE ONE TABLET BY MOUTH TWICE DAILY 180 tablet 1  . nitroGLYCERIN (NITROSTAT) 0.4 MG SL tablet Place 1 tablet (0.4 mg total) under the tongue every 5 (five) minutes as needed for chest pain. 25 tablet 3  . potassium chloride SA (KLOR-CON M20) 20 MEQ tablet TAKE ONE TABLET BY MOUTH ONCE DAILY 90 tablet 1  . torsemide (DEMADEX) 20 MG tablet TAKE TWO TABLETS BY MOUTH ONCE DAILY 180 tablet 0  . Vitamin D, Ergocalciferol, (DRISDOL) 50000 units CAPS capsule Take 1 capsule (50,000 Units total) by mouth once a week. 12 capsule 1  . glucose blood (ONE TOUCH ULTRA TEST) test strip Use to test daily 300 each 3  . ONETOUCH DELICA LANCETS [id number] MISC Use one daily 300 each 3   No current facility-administered medications for this visit.    Past Medical History  Diagnosis Date  . HTN (hypertension)   . CAD (coronary artery disease)     a. s/p NSTEMI 4/11 => s/p CABG (L-LAD, S-OM2, S-PDA/PL);  b.  ETT-Myoview 6/14:  normal study, no ischemia, EF 67%  . Renal  insufficiency   . COPD (chronic obstructive pulmonary disease) ([place])   . Obesity   . HLD (hyperlipidemia)   . Leg edema   . Hx of echocardiogram     Echo 6/14: Mod LVH, focal basal hypertrophy, EF 50-55%, mild AS (mean 17 mmHg) and mild AI, mild to mod LAE, mild RAE  . Carotid stenosis     Carotid U/S [id number]:  RICA 123456, LICA 123456; right vertebral flow retrograde suggestive of steel-consider PV consult  . Subclavian artery stenosis, right     based upon carotid U/S done 11/2012    Past Surgical History  Procedure Laterality Date  . Hernia repair    . Tonsillectomy    . Coronary artery bypass graft      2011, LIMA to LAD coronary artery, SVG to OM2 branch of lect circumflex coronary artery, and a sequential SVG to psot descening to posterolateral branches to RCA  . Endoscopic vein harvesting      right leg     ROS:  As stated in the HPI and negative for all other systems.  PHYSICAL EXAM BP 119/63 mmHg  Pulse 74  Ht 5' 7.5" (1.715 m)  Wt 243 lb (110.224 kg)  BMI 37.48 kg/m2 GENERAL:  Well appearing HEENT:  Pupils equal round and reactive,  fundi not visualized, oral mucosa unremarkable NECK:  No jugular venous distention, waveform within normal limits, carotid upstroke brisk and symmetric, no bruits, no thyromegaly LUNGS:  Clear to auscultation bilaterally BACK:  No CVA tenderness CHEST:  Well healed sternotomy scar. HEART:  PMI not displaced or sustained,S1 and S2 within normal limits, no S3, no S4, no clicks, no rubs, soft right upper sternal border murmur early peaking ABD:  Flat, positive bowel sounds normal in frequency in pitch, no bruits, no rebound, no guarding, no midline pulsatile mass, no hepatomegaly, no splenomegaly, obese EXT:  2 plus pulses throughout, moderate edema, no cyanosis no clubbing   EKG: Sinus rhythm, rate 80, axis within normal limits, intervals within normal limits, no acute ST-T wave changes.  PVCs. 10/06/2015   ASSESSMENT AND PLAN  CORONARY  ATHEROSCLEROSIS NATIVE CORONARY ARTERY - He's had no new symptoms consistent with angina since his stress test in 2014.   He needs better risk reduction.    HYPERTENSION -  The blood pressure is at target.  He will remain on the meds as listed.   DYSLIPIDEMIA -   I will have him come back for a fasting lipid profile.   If he is still not at target and he'll need to be on 80 mg of Lipitor.  AS - I will follow up with an echo.    CAROTID STENOSIS - He is due for follow up.  He has less than 60% bilateral stenosis.   OBESITY - The patient understands the need to lose weight with diet and exercise. We have discussed specific strategies for this.  He is going to cut out bread.

## 2015-10-06 NOTE — Patient Instructions (Signed)
Medication Instructions:  The current medical regimen is effective;  continue present plan and medications.  Testing/Procedures: Your physician has requested that you have an echocardiogram. Echocardiography is a painless test that uses sound waves to create images of your heart. It provides your doctor with information about the size and shape of your heart and how well your heart's chambers and valves are working. This procedure takes approximately one hour. There are no restrictions for this procedure.  Your physician has requested that you have a carotid duplex. This test is an ultrasound of the carotid arteries in your neck. It looks at blood flow through these arteries that supply the brain with blood. Allow one hour for this exam. There are no restrictions or special instructions.  Follow-Up: Follow up in 1 year with Dr. Hochrein.  You will receive a letter in the mail 2 months before you are due.  Please call us when you receive this letter to schedule your follow up appointment.  If you need a refill on your cardiac medications before your next appointment, please call your pharmacy.  Thank you for choosing [facility]!!     

## 2015-10-11 ENCOUNTER — Ambulatory Visit (HOSPITAL_COMMUNITY)
Admission: RE | Admit: 2015-10-11 | Discharge: 2015-10-11 | Disposition: A | Discharge status: Home / Self Care | Payer: Medicare Other | Source: Ambulatory Visit | Attending: Cardiology | Admitting: Cardiology

## 2015-10-11 DIAGNOSIS — R0602 Shortness of breath: Secondary | ICD-10-CM

## 2015-10-11 DIAGNOSIS — I517 Cardiomegaly: Secondary | ICD-10-CM | POA: Diagnosis not present

## 2015-10-11 DIAGNOSIS — I351 Nonrheumatic aortic (valve) insufficiency: Secondary | ICD-10-CM | POA: Diagnosis not present

## 2015-10-11 DIAGNOSIS — E119 Type 2 diabetes mellitus without complications: Secondary | ICD-10-CM | POA: Insufficient documentation

## 2015-10-11 DIAGNOSIS — E785 Hyperlipidemia, unspecified: Secondary | ICD-10-CM | POA: Diagnosis not present

## 2015-10-11 DIAGNOSIS — I059 Rheumatic mitral valve disease, unspecified: Secondary | ICD-10-CM | POA: Diagnosis not present

## 2015-10-11 DIAGNOSIS — Z951 Presence of aortocoronary bypass graft: Secondary | ICD-10-CM | POA: Diagnosis not present

## 2015-10-11 DIAGNOSIS — I6523 Occlusion and stenosis of bilateral carotid arteries: Secondary | ICD-10-CM

## 2015-10-11 DIAGNOSIS — I350 Nonrheumatic aortic (valve) stenosis: Secondary | ICD-10-CM | POA: Diagnosis present

## 2015-11-01 ENCOUNTER — Other Ambulatory Visit: Payer: Self-pay | Admitting: Family

## 2015-11-04 ENCOUNTER — Other Ambulatory Visit: Payer: Self-pay | Admitting: *Deleted

## 2015-11-04 MED ORDER — LISINOPRIL 10 MG PO TABS: 10.0000 mg | ORAL_TABLET | Freq: Every day | ORAL | Status: DC

## 2015-11-24 ENCOUNTER — Other Ambulatory Visit: Payer: Self-pay | Admitting: Family

## 2015-11-24 ENCOUNTER — Other Ambulatory Visit: Payer: Self-pay | Admitting: Cardiology

## 2015-11-29 ENCOUNTER — Ambulatory Visit (INDEPENDENT_AMBULATORY_CARE_PROVIDER_SITE_OTHER): Payer: Medicare Other | Admitting: Family

## 2015-11-29 ENCOUNTER — Encounter: Payer: Self-pay | Admitting: Family

## 2015-11-29 ENCOUNTER — Ambulatory Visit (INDEPENDENT_AMBULATORY_CARE_PROVIDER_SITE_OTHER): Payer: Medicare Other

## 2015-11-29 VITALS — BP 135/65 | HR 66 | Temp 97.0°F | Ht 67.5 in | Wt 242.6 lb

## 2015-11-29 DIAGNOSIS — E669 Obesity, unspecified: Secondary | ICD-10-CM | POA: Diagnosis not present

## 2015-11-29 DIAGNOSIS — R062 Wheezing: Secondary | ICD-10-CM

## 2015-11-29 DIAGNOSIS — E559 Vitamin D deficiency, unspecified: Secondary | ICD-10-CM | POA: Diagnosis not present

## 2015-11-29 DIAGNOSIS — I2510 Atherosclerotic heart disease of native coronary artery without angina pectoris: Secondary | ICD-10-CM

## 2015-11-29 DIAGNOSIS — E785 Hyperlipidemia, unspecified: Secondary | ICD-10-CM

## 2015-11-29 DIAGNOSIS — E1165 Type 2 diabetes mellitus with hyperglycemia: Secondary | ICD-10-CM | POA: Diagnosis not present

## 2015-11-29 DIAGNOSIS — E039 Hypothyroidism, unspecified: Secondary | ICD-10-CM

## 2015-11-29 LAB — BAYER DCA HB A1C WAIVED: HB A1C (BAYER DCA - WAIVED): 7.2 % — ABNORMAL HIGH (ref <7.0)

## 2015-11-29 MED ORDER — ALBUTEROL SULFATE HFA 108 (90 BASE) MCG/ACT IN AERS: 2.0000 | INHALATION_SPRAY | Freq: Four times a day (QID) | RESPIRATORY_TRACT | Status: DC | PRN

## 2015-11-29 NOTE — Progress Notes (Signed)
Subjective:    Patient ID: Matthew May, male    DOB: June 15, 1945, 70 y.o.   MRN: [id number]  Pt presents to the office today for chronic follow up. Pt is followed by Cardiologists annually for CAD. Diabetes He presents for his follow-up diabetic visit. He has type 2 diabetes mellitus. His disease course has been stable. Pertinent negatives for hypoglycemia include no confusion or dizziness. There are no diabetic associated symptoms. Pertinent negatives for diabetes include no blurred vision, no foot paresthesias, no foot ulcerations and no visual change. Pertinent negatives for hypoglycemia complications include no blackouts. Symptoms are stable. Diabetic complications include heart disease. Pertinent negatives for diabetic complications include no CVA, nephropathy or peripheral neuropathy. Risk factors for coronary artery disease include diabetes mellitus, family history, dyslipidemia, hypertension, male sex and post-menopausal. Current diabetic treatment includes oral agent (monotherapy). He is compliant with treatment all of the time. He is following a diabetic diet. His breakfast blood glucose range is generally 110-130 mg/dl. An ACE inhibitor/angiotensin II receptor blocker is being taken. Eye exam is not current.  Hyperlipidemia This is a chronic problem. The current episode started more than 1 year ago. The problem is controlled. Recent lipid tests were reviewed and are normal. Exacerbating diseases include diabetes and hypothyroidism. Pertinent negatives include no leg pain or myalgias. Current antihyperlipidemic treatment includes statins. The current treatment provides moderate improvement of lipids. Risk factors for coronary artery disease include dyslipidemia, diabetes mellitus, family history, hypertension, male sex and obesity.  Thyroid Problem Presents for follow-up visit. Patient reports no constipation, depressed mood, diarrhea, heat intolerance, nail problem or visual change. The  symptoms have been stable. Past treatments include levothyroxine. The treatment provided significant relief. His past medical history is significant for diabetes and hyperlipidemia.      Review of Systems  Constitutional: Negative.   HENT: Negative.   Eyes: Negative for blurred vision.  Respiratory: Negative.   Cardiovascular: Negative.   Gastrointestinal: Negative.  Negative for diarrhea and constipation.  Endocrine: Negative.  Negative for heat intolerance.  Genitourinary: Negative.   Musculoskeletal: Negative.  Negative for myalgias.  Neurological: Negative.  Negative for dizziness.  Hematological: Negative.   Psychiatric/Behavioral: Negative.  Negative for confusion.  All other systems reviewed and are negative.      Objective:   Physical Exam  Constitutional: He is oriented to person, place, and time. He appears well-developed and well-nourished. No distress.  HENT:  Head: Normocephalic.  Right Ear: External ear normal.  Left Ear: External ear normal.  Nose: Nose normal.  Mouth/Throat: Oropharynx is clear and moist.  Eyes: Pupils are equal, round, and reactive to light. Right eye exhibits no discharge. Left eye exhibits no discharge.  Neck: Normal range of motion. Neck supple. No thyromegaly present.  Cardiovascular: Normal rate, regular rhythm and intact distal pulses.   Murmur heard. Pulmonary/Chest: Effort normal. No respiratory distress. He has wheezes.  Diminished breath sounds   Abdominal: Soft. Bowel sounds are normal. He exhibits no distension. There is no tenderness.  Musculoskeletal: Normal range of motion. He exhibits no edema or tenderness.  Neurological: He is alert and oriented to person, place, and time. He has normal reflexes. No cranial nerve deficit.  Skin: Skin is warm and dry. No rash noted. No erythema.  Psychiatric: He has a normal mood and affect. His behavior is normal. Judgment and thought content normal.  Vitals reviewed.   BP 135/65 mmHg   Pulse 66  Temp(Src) 97 F (36.1 C) (Oral)  Ht 5' 7.5" (  1.715 m)  Wt 242 lb 9.6 oz (110.043 kg)  BMI 37.41 kg/m2  Chest x-ray- Enlarged heart Preliminary reading by Evelina Dun, FNP [facility]      Assessment & Plan:  1. Vitamin D deficiency - CMP14+EGFR  2. Hyperlipidemia - Lipid panel - CMP14+EGFR  3. Type 2 diabetes mellitus with hyperglycemia, without long-term current use of insulin (HCC) - Bayer DCA Hb A1c Waived - CMP14+EGFR - Ambulatory referral to Ophthalmology  4. Hypothyroidism, unspecified hypothyroidism type - CMP14+EGFR - Thyroid Panel With TSH  5. Atherosclerosis of native coronary artery of native heart without angina pectoris - CMP14+EGFR  6. Obesity (BMI 30-39.9) - CMP14+EGFR  7. Wheezing -PT started on albuterol today - DG Chest 2 View; Future - albuterol (PROVENTIL HFA;VENTOLIN HFA) 108 (90 Base) MCG/ACT inhaler; Inhale 2 puffs into the lungs every 6 (six) hours as needed for wheezing or shortness of breath.  Dispense: 1 Inhaler; Refill: 2   Continue all meds Labs pending Health Maintenance reviewed Diet and exercise encouraged RTO 3 months  Evelina Dun, FNP

## 2015-11-29 NOTE — Patient Instructions (Signed)
Diabetes Mellitus and Food It is important for you to manage your blood sugar (glucose) level. Your blood glucose level can be greatly affected by what you eat. Eating healthier foods in the appropriate amounts throughout the day at about the same time each day will help you control your blood glucose level. It can also help slow or prevent worsening of your diabetes mellitus. Healthy eating may even help you improve the level of your blood pressure and reach or maintain a healthy weight.  General recommendations for healthful eating and cooking habits include:  Eating meals and snacks regularly. Avoid going long periods of time without eating to lose weight.  Eating a diet that consists mainly of plant-based foods, such as fruits, vegetables, nuts, legumes, and whole grains.  Using low-heat cooking methods, such as baking, instead of high-heat cooking methods, such as deep frying. Work with your dietitian to make sure you understand how to use the Nutrition Facts information on food labels. HOW CAN FOOD AFFECT ME? Carbohydrates Carbohydrates affect your blood glucose level more than any other type of food. Your dietitian will help you determine how many carbohydrates to eat at each meal and teach you how to count carbohydrates. Counting carbohydrates is important to keep your blood glucose at a healthy level, especially if you are using insulin or taking certain medicines for diabetes mellitus. Alcohol Alcohol can cause sudden decreases in blood glucose (hypoglycemia), especially if you use insulin or take certain medicines for diabetes mellitus. Hypoglycemia can be a life-threatening condition. Symptoms of hypoglycemia (sleepiness, dizziness, and disorientation) are similar to symptoms of having too much alcohol.  If your health care provider has given you approval to drink alcohol, do so in moderation and use the following guidelines:  Women should not have more than one drink per day, and men  should not have more than two drinks per day. One drink is equal to:  12 oz of beer.  5 oz of wine.  1 oz of hard liquor.  Do not drink on an empty stomach.  Keep yourself hydrated. Have water, diet soda, or unsweetened iced tea.  Regular soda, juice, and other mixers might contain a lot of carbohydrates and should be counted. WHAT FOODS ARE NOT RECOMMENDED? As you make food choices, it is important to remember that all foods are not the same. Some foods have fewer nutrients per serving than other foods, even though they might have the same number of calories or carbohydrates. It is difficult to get your body what it needs when you eat foods with fewer nutrients. Examples of foods that you should avoid that are high in calories and carbohydrates but low in nutrients include:  Trans fats (most processed foods list trans fats on the Nutrition Facts label).  Regular soda.  Juice.  Candy.  Sweets, such as cake, pie, doughnuts, and cookies.  Fried foods. WHAT FOODS CAN I EAT? Eat nutrient-rich foods, which will nourish your body and keep you healthy. The food you should eat also will depend on several factors, including:  The calories you need.  The medicines you take.  Your weight.  Your blood glucose level.  Your blood pressure level.  Your cholesterol level. You should eat a variety of foods, including:  Protein.  Lean cuts of meat.  Proteins low in saturated fats, such as fish, egg whites, and beans. Avoid processed meats.  Fruits and vegetables.  Fruits and vegetables that may help control blood glucose levels, such as apples, mangoes, and   yams.  Dairy products.  Choose fat-free or low-fat dairy products, such as milk, yogurt, and cheese.  Grains, bread, pasta, and rice.  Choose whole grain products, such as multigrain bread, whole oats, and brown rice. These foods may help control blood pressure.  Fats.  Foods containing healthful fats, such as nuts,  avocado, olive oil, canola oil, and fish. DOES EVERYONE WITH DIABETES MELLITUS HAVE THE SAME MEAL PLAN? Because every person with diabetes mellitus is different, there is not one meal plan that works for everyone. It is very important that you meet with a dietitian who will help you create a meal plan that is just right for you.   This information is not intended to replace advice given to you by your health care provider. Make sure you discuss any questions you have with your health care provider.   Document Released: 02/16/2005 Document Revised: 06/12/2014 Document Reviewed: 04/18/2013 [employer] Interactive Patient Education 2016 [employer]  

## 2015-11-30 LAB — CMP14+EGFR
ALT: 31 IU/L (ref 0–44)
AST: 19 IU/L (ref 0–40)
Albumin/Globulin Ratio: 1.7 (ref 1.2–2.2)
Albumin: 4.3 g/dL (ref 3.6–4.8)
Alkaline Phosphatase: 72 IU/L (ref 39–117)
BUN/Creatinine Ratio: 15 (ref 10–24)
BUN: 13 mg/dL (ref 8–27)
Bilirubin Total: 0.4 mg/dL (ref 0.0–1.2)
CO2: 26 mmol/L (ref 18–29)
Calcium: 9.1 mg/dL (ref 8.6–10.2)
Chloride: 100 mmol/L (ref 96–106)
Creatinine, Ser: 0.86 mg/dL (ref 0.76–1.27)
GFR calc Af Amer: 103 mL/min/{1.73_m2} (ref >59)
GFR calc non Af Amer: 89 mL/min/{1.73_m2} (ref >59)
Globulin, Total: 2.5 g/dL (ref 1.5–4.5)
Glucose: 142 mg/dL — ABNORMAL HIGH (ref 65–99)
Potassium: 4.9 mmol/L (ref 3.5–5.2)
Sodium: 141 mmol/L (ref 134–144)
Total Protein: 6.8 g/dL (ref 6.0–8.5)

## 2015-11-30 LAB — LIPID PANEL
Chol/HDL Ratio: 3.8 ratio units (ref 0.0–5.0)
Cholesterol, Total: 118 mg/dL (ref 100–199)
HDL: 31 mg/dL — ABNORMAL LOW (ref >39)
LDL Calculated: 60 mg/dL (ref 0–99)
Triglycerides: 133 mg/dL (ref 0–149)
VLDL Cholesterol Cal: 27 mg/dL (ref 5–40)

## 2015-11-30 LAB — THYROID PANEL WITH TSH
Free Thyroxine Index: 2.2 (ref 1.2–4.9)
T3 Uptake Ratio: 29 % (ref 24–39)
T4, Total: 7.7 ug/dL (ref 4.5–12.0)
TSH: 2.31 u[IU]/mL (ref 0.450–4.500)

## 2015-12-03 ENCOUNTER — Other Ambulatory Visit: Payer: Self-pay | Admitting: Family Medicine

## 2015-12-03 DIAGNOSIS — E119 Type 2 diabetes mellitus without complications: Secondary | ICD-10-CM

## 2015-12-03 MED ORDER — GLUCOSE BLOOD VI STRP: ORAL_STRIP | Status: DC

## 2015-12-06 ENCOUNTER — Telehealth: Payer: Self-pay | Admitting: *Deleted

## 2015-12-06 MED ORDER — GLUCOSE BLOOD VI STRP: ORAL_STRIP | Status: DC

## 2015-12-06 NOTE — Telephone Encounter (Signed)
done

## 2016-01-10 ENCOUNTER — Telehealth: Payer: Self-pay | Admitting: Family

## 2016-01-12 ENCOUNTER — Other Ambulatory Visit: Payer: Self-pay | Admitting: Family

## 2016-02-23 ENCOUNTER — Encounter: Payer: Self-pay | Admitting: Pharmacist

## 2016-02-23 ENCOUNTER — Ambulatory Visit (INDEPENDENT_AMBULATORY_CARE_PROVIDER_SITE_OTHER): Payer: Medicare Other | Admitting: Pharmacist

## 2016-02-23 VITALS — BP 132/66 | HR 70 | Ht 67.5 in | Wt 242.5 lb

## 2016-02-23 DIAGNOSIS — Z0000 Encounter for general adult medical examination without abnormal findings: Secondary | ICD-10-CM | POA: Diagnosis not present

## 2016-02-23 DIAGNOSIS — R062 Wheezing: Secondary | ICD-10-CM

## 2016-02-23 MED ORDER — ALBUTEROL SULFATE HFA 108 (90 BASE) MCG/ACT IN AERS: 2.0000 | INHALATION_SPRAY | Freq: Four times a day (QID) | RESPIRATORY_TRACT | 1 refills | Status: DC | PRN

## 2016-02-23 MED ORDER — VALSARTAN 80 MG PO TABS: 80.0000 mg | ORAL_TABLET | Freq: Every day | ORAL | 1 refills | Status: DC

## 2016-02-23 NOTE — Progress Notes (Signed)
Patient ID: Matthew May, male   DOB: 05/23/1946, 70 y.o.   MRN: [id number]     Subjective:   Matthew May is a 70 y.o. male who presents for a subsequent Medicare Annual Wellness Visit.  Matthew May is married for 47 years.  He and his wife along with his 43 year old grandson live in [place], [place].   He is retired now.  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    Matthew May served in the [employer] when he was younger 3 to 4 years with 2 tours in [place].   Matthew May reports that he has developed a dry cough over the last 6 months.He recently read an article in the newspaper about ACE-I and that they can cause a cough. He is concerned that his lisinopril is causing cough.     Current Medications (verified) Outpatient Encounter Prescriptions as of 02/23/2016  Medication Sig  . albuterol (PROVENTIL HFA;VENTOLIN HFA) 108 (90 Base) MCG/ACT inhaler Inhale 2 puffs into the lungs every 6 (six) hours as needed for wheezing or shortness of breath.  Marland Kitchen aspirin EC 81 MG tablet Take 81 mg by mouth daily.  Marland Kitchen atorvastatin (LIPITOR) 40 MG tablet Take 1 tablet by mouth  daily  . glucose blood (ONETOUCH VERIO) test strip Test sugar qd. DX E11.9  . levothyroxine (SYNTHROID, LEVOTHROID) 125 MCG tablet Take 1 tablet by mouth  daily  . metFORMIN (GLUCOPHAGE) 1000 MG tablet Take 1 tablet by mouth  twice a day  . nitroGLYCERIN (NITROSTAT) 0.4 MG SL tablet Place 1 tablet (0.4 mg total) under the tongue every 5 (five) minutes as needed for chest pain.  Glory Rosebush DELICA LANCETS 99991111 MISC Use one daily  . potassium chloride SA (K-DUR,KLOR-CON) 20 MEQ tablet Take 1 tablet by mouth once daily  . torsemide (DEMADEX) 20 MG tablet Take 2 tablets by mouth  once daily  . Vitamin D, Ergocalciferol, (DRISDOL) 50000 units CAPS capsule Take 1 capsule (50,000 Units total) by mouth once a week.  . [DISCONTINUED] albuterol  (PROVENTIL HFA;VENTOLIN HFA) 108 (90 Base) MCG/ACT inhaler Inhale 2 puffs into the lungs every 6 (six) hours as needed for wheezing or shortness of breath.  . [DISCONTINUED] lisinopril (PRINIVIL) 10 MG tablet Take 1 tablet (10 mg total) by mouth daily.  . valsartan (DIOVAN) 80 MG tablet Take 1 tablet (80 mg total) by mouth daily. Discontinue lisinopril due to cough   No facility-administered encounter medications on file as of 02/23/2016.     Allergies (verified) Lisinopril   History: Past Medical History:  Diagnosis Date  . CAD (coronary artery disease)    a. s/p NSTEMI 4/11 => s/p CABG (L-LAD, S-OM2, S-PDA/PL);  b.  ETT-Myoview 6/14:  normal study, no ischemia, EF 67%  . Carotid stenosis    Carotid U/S [id number]:  RICA 123456, LICA 123456; right vertebral flow retrograde suggestive of steel-consider PV consult  . COPD (chronic obstructive pulmonary disease) ([place])   . HLD (hyperlipidemia)   . HTN (hypertension)   . Hx of echocardiogram    Echo 6/14: Mod LVH, focal basal hypertrophy, EF 50-55%, mild AS (mean 17 mmHg) and mild AI, mild to mod LAE, mild RAE  . Leg edema   . Obesity   . Renal insufficiency   . Subclavian artery stenosis, right    based upon carotid U/S done 11/2012   Past Surgical History:  Procedure Laterality Date  . CORONARY ARTERY BYPASS GRAFT     2011, LIMA to LAD coronary artery, SVG to OM2 branch of lect circumflex coronary artery, and a sequential SVG to psot descening to posterolateral branches to RCA  . endoscopic vein harvesting     right leg   . HERNIA REPAIR    . TONSILLECTOMY     Family History  Problem Relation Age of Onset  . Heart disease Father   . Heart attack Father   . Mental illness Mother   . Mental illness Sister   . Diabetes Brother   . Depression Maternal Aunt    Social History   Occupational History  . Not on file.   Social History Main Topics  . Smoking status: Former Smoker    Quit date: 07/11/2009  . Smokeless tobacco: Never Used      Comment: smoked about 10 cig/day; used to smoke 2 ppd for many years   . Alcohol use No  . Drug use: No  . Sexual activity: No    Do you feel safe at home?  Yes Are there smokers in your home (other than you)? No  Dietary issues and exercise activities discussed: Current Exercise Habits: The patient does not participate in regular exercise at present, Exercise limited by: cardiac condition(s)  Current Dietary habits:  Patient states he limits sodium intake (only salts his watermelon).  He eats lots of vegetables but sometimes likes them fried like fried okra.    Cardiac Risk Factors include: advanced age (>80men, >27 women);diabetes mellitus;dyslipidemia;hypertension;male gender;obesity (BMI >30kg/m2)  Objective:    Today's Vitals   02/23/16 1525  BP: 132/66  Pulse: 70  Weight: 242 lb 8 oz (110 kg)  Height: 5' 7.5" (1.715 m)  PainSc: 0-No pain   Body mass index is 37.42 kg/m.   Activities of Daily Living In your present state of health, do you have any difficulty performing the following activities: 02/23/2016 08/23/2015  Hearing? N N  Vision? Y N  Difficulty concentrating or making decisions? N N  Walking or climbing stairs? Y N  Dressing or bathing? N N  Doing errands, shopping? N N  Preparing Food and eating ? N -  Using the Toilet? N -  In the past six months, have you accidently leaked urine? N -  Do you have problems with loss of bowel control? N -  Managing your Medications? N -  Managing your Finances? N -  Housekeeping or managing your Housekeeping? N -  Some recent data might be hidden     Depression Screen PHQ 2/9 Scores 02/23/2016 11/29/2015 08/23/2015 09/17/2014  PHQ - 2 Score 0 0 0 0     Fall Risk Fall Risk  02/23/2016 11/29/2015 08/23/2015 09/17/2014 03/18/2014  Falls in the past year? Yes No Yes Yes No  Number falls in past yr: 1 - 1 1 -  Injury with Fall? No - No No -  Risk for fall due to : Impaired balance/gait - - Impaired balance/gait -    Risk for fall due to (comments): c/o of occ leg pain with excertion - - - -  Follow up Falls prevention discussed - Falls prevention discussed Education provided -    Cognitive Function: MMSE - Mini Mental State Exam 02/23/2016 09/17/2014  Orientation to time 5 5  Orientation to Place 5 5  Registration 3 3  Attention/ Calculation 4 5  Recall 2 1  Language- name 2 objects 2 2  Language- repeat 1  1  Language- follow 3 step command 2 3  Language- read & follow direction 1 1  Write a sentence 1 1  Copy design 1 1  Total score 27 28    Immunizations and Health Maintenance Immunization History  Administered Date(s) Administered  . Influenza, High Dose Seasonal PF 03/31/2014, 04/24/2015  . Influenza-Unspecified 02/03/2013  . Pneumococcal Conjugate-13 09/16/2014  . Pneumococcal Polysaccharide-23 08/06/2012   Health Maintenance Due  Topic Date Due  . INFLUENZA VACCINE  01/04/2016    Patient Care Team: Sharion Balloon, FNP as PCP - General (Nurse Practitioner) Minus Breeding, MD as Consulting Physician (Cardiology)  Indicate any recent Medical Services you may have received from other than [facility] providers in the past year (date may be approximate).    Assessment:    Annual Wellness Visit  Leg pain with history of falls Dry cough - Possible side effect from lisinopril - ACE    Screening Tests Health Maintenance  Topic Date Due  . INFLUENZA VACCINE  01/04/2016  . OPHTHALMOLOGY EXAM  02/23/2016 (Originally 08/06/1955)  . COLONOSCOPY  02/23/2016 (Originally 08/06/1997)  . ZOSTAVAX  02/23/2016 (Originally 08/07/2007)  . HEMOGLOBIN A1C  05/30/2016  . COLON CANCER SCREENING ANNUAL FOBT  08/24/2016  . FOOT EXAM  11/28/2016  . TETANUS/TDAP  10/04/2019  . Hepatitis C Screening  Completed  . PNA vac Low Risk Adult  Completed        Plan:   During the course of the visit Antonin was educated and counseled about the following appropriate screening and preventive services:    Vaccines to include Pneumoccal, Influenza,  Td, Zostavax - patient plans to get flu vaccine tomorrow at [facility] with his grandson.  De declined Zostavax due to cost.    Colorectal cancer screening - patient refuses colonoscopy  Cardiovascular disease screening - follows up with Dr Jenkins [place] regularly.  EKG, ECHO and carotid US is UTD   Glaucoma screening / Diabetic Eye Exam - patient  Refuses.  States that he feels would be a waste of time because he cannot afford glasses.  I discussed going to [place] in [place] as he should qualify for [facility] services however he refused  Nutrition counseling - discussed limiting fried foods and alternative ways to prepare vegetables (not fried).  Discussed limiting sugar and high CHO foods.  Changed lisinopril to valsartan 80mg  1 tablet daily  Advanced Directives - declined  Physical Activity - Recommend walking daily as able.   Offered referral to PCP or vascular for evaluation of leg pain.  Patient declined.     Patient Instructions (the written plan) were given to the patient.   Cherre Robins, PharmD   02/23/2016

## 2016-02-23 NOTE — Patient Instructions (Addendum)
[name] , Thank you for taking time to come for your Medicare Wellness Visit. I appreciate your ongoing commitment to your health goals. Please review the following plan we discussed and let me know if I can assist you in the future.   These are the goals we discussed: Continue to stay active - gardening.   Increase non-starchy vegetables - carrots, green bean, squash, zucchini, tomatoes, onions, peppers, spinach and other green leafy vegetables, cabbage, lettuce, cucumbers, asparagus, okra (not fried), eggplant Limit sugar and processed foods (cakes, cookies, ice cream, crackers and chips) Increase fresh fruit but limit serving sizes 1/2 cup or about the size of tennis or baseball Limit red meat to no more than 1-2 times per week (serving size about the size of your palm) Choose whole grains / lean proteins - whole wheat bread, quinoa, whole grain rice (1/2 cup), fish, chicken, [place] Avoid sugar and calorie containing beverages - soda, sweet tea and juice.  Choose water or unsweetened tea instead.    This is a list of the screening recommended for you and due dates:  Health Maintenance  Topic Date Due  . Flu Shot  01/04/2016  . Eye exam for diabetics  02/23/2016*  . Colon Cancer Screening  02/23/2016*  . Shingles Vaccine  02/23/2016*  . Hemoglobin A1C  05/30/2016  . Stool Blood Test  08/24/2016  . Complete foot exam   11/28/2016  . Tetanus Vaccine  10/04/2019  .  Hepatitis C: One time screening is recommended by Center for Disease Control  (CDC) for  adults born from [birth year] through 1965.   Completed  . Pneumonia vaccines  Completed  *Topic was postponed. The date shown is not the original due date.   Health Maintenance, Male A healthy lifestyle and preventative care can promote health and wellness.  Maintain regular health, dental, and eye exams.  Eat a healthy diet. Foods like vegetables, fruits, whole grains, low-fat dairy products, and lean protein foods contain the  nutrients you need and are low in calories. Decrease your intake of foods high in solid fats, added sugars, and salt. Get information about a proper diet from your health care provider, if necessary.  Regular physical exercise is one of the most important things you can do for your health. Most adults should get at least 150 minutes of moderate-intensity exercise (any activity that increases your heart rate and causes you to sweat) each week. In addition, most adults need muscle-strengthening exercises on 2 or more days a week.   Maintain a healthy weight. The body mass index (BMI) is a screening tool to identify possible weight problems. It provides an estimate of body fat based on height and weight. Your health care provider can find your BMI and can help you achieve or maintain a healthy weight. For males 20 years and older:  A BMI below 18.5 is considered underweight.  A BMI of 18.5 to 24.9 is normal.  A BMI of 25 to 29.9 is considered overweight.  A BMI of 30 and above is considered obese.  Maintain normal blood lipids and cholesterol by exercising and minimizing your intake of saturated fat. Eat a balanced diet with plenty of fruits and vegetables. Blood tests for lipids and cholesterol should begin at age 19 and be repeated every 5 years. If your lipid or cholesterol levels are high, you are over age 63, or you are at high risk for heart disease, you may need your cholesterol levels checked more frequently.Ongoing high lipid and  cholesterol levels should be treated with medicines if diet and exercise are not working.  If you smoke, find out from your health care provider how to quit. If you do not use tobacco, do not start.  Lung cancer screening is recommended for adults aged 55-80 years who are at high risk for developing lung cancer because of a history of smoking. A yearly low-dose CT scan of the lungs is recommended for people who have at least a 30-pack-year history of smoking and  are current smokers or have quit within the past 15 years. A pack year of smoking is smoking an average of 1 pack of cigarettes a day for 1 year (for example, a 30-pack-year history of smoking could mean smoking 1 pack a day for 30 years or 2 packs a day for 15 years). Yearly screening should continue until the smoker has stopped smoking for at least 15 years. Yearly screening should be stopped for people who develop a health problem that would prevent them from having lung cancer treatment.  If you choose to drink alcohol, do not have more than 2 drinks per day. One drink is considered to be 12 oz (360 mL) of beer, 5 oz (150 mL) of wine, or 1.5 oz (45 mL) of liquor.  Avoid the use of street drugs. Do not share needles with anyone. Ask for help if you need support or instructions about stopping the use of drugs.  High blood pressure causes heart disease and increases the risk of stroke. High blood pressure is more likely to develop in:  People who have blood pressure in the end of the normal range (100-139/85-89 mm Hg).  People who are overweight or obese.  People who are African American.  If you are 52-40 years of age, have your blood pressure checked every 3-5 years. If you are 31 years of age or older, have your blood pressure checked every year. You should have your blood pressure measured twice--once when you are at a hospital or clinic, and once when you are not at a hospital or clinic. Record the average of the two measurements. To check your blood pressure when you are not at a hospital or clinic, you can use:  An automated blood pressure machine at a pharmacy.  A home blood pressure monitor.  If you are 63-88 years old, ask your health care provider if you should take aspirin to prevent heart disease.  Diabetes screening involves taking a blood sample to check your fasting blood sugar level. This should be done once every 3 years after age 27 if you are at a normal weight and without  risk factors for diabetes. Testing should be considered at a younger age or be carried out more frequently if you are overweight and have at least 1 risk factor for diabetes.  Colorectal cancer can be detected and often prevented. Most routine colorectal cancer screening begins at the age of 37 and continues through age 2. However, your health care provider may recommend screening at an earlier age if you have risk factors for colon cancer. On a yearly basis, your health care provider may provide home test kits to check for hidden blood in the stool. A small camera at the end of a tube may be used to directly examine the colon (sigmoidoscopy or colonoscopy) to detect the earliest forms of colorectal cancer. Talk to your health care provider about this at age 33 when routine screening begins. A direct exam of the colon should be  repeated every 5-10 years through age 41, unless early forms of precancerous polyps or small growths are found.  People who are at an increased risk for hepatitis B should be screened for this virus. You are considered at high risk for hepatitis B if:  You were born in a country where hepatitis B occurs often. Talk with your health care provider about which countries are considered high risk.  Your parents were born in a high-risk country and you have not received a shot to protect against hepatitis B (hepatitis B vaccine).  You have HIV or AIDS.  You use needles to inject street drugs.  You live with, or have sex with, someone who has hepatitis B.  You are a man who has sex with other men (MSM).  You get hemodialysis treatment.  You take certain medicines for conditions like cancer, organ transplantation, and autoimmune conditions.  Hepatitis C blood testing is recommended for all people born from [birth year] through 1965 and any individual with known risk factors for hepatitis C.  Healthy men should no longer receive prostate-specific antigen (PSA) blood tests as part of  routine cancer screening. Talk to your health care provider about prostate cancer screening.  Testicular cancer screening is not recommended for adolescents or adult males who have no symptoms. Screening includes self-exam, a health care provider exam, and other screening tests. Consult with your health care provider about any symptoms you have or any concerns you have about testicular cancer.  Practice safe sex. Use condoms and avoid high-risk sexual practices to reduce the spread of sexually transmitted infections (STIs).  You should be screened for STIs, including gonorrhea and chlamydia if:  You are sexually active and are younger than 24 years.  You are older than 24 years, and your health care provider tells you that you are at risk for this type of infection.  Your sexual activity has changed since you were last screened, and you are at an increased risk for chlamydia or gonorrhea. Ask your health care provider if you are at risk.  If you are at risk of being infected with HIV, it is recommended that you take a prescription medicine daily to prevent HIV infection. This is called pre-exposure prophylaxis (PrEP). You are considered at risk if:  You are a man who has sex with other men (MSM).  You are a heterosexual man who is sexually active with multiple partners.  You take drugs by injection.  You are sexually active with a partner who has HIV.  Talk with your health care provider about whether you are at high risk of being infected with HIV. If you choose to begin PrEP, you should first be tested for HIV. You should then be tested every 3 months for as long as you are taking PrEP.  Use sunscreen. Apply sunscreen liberally and repeatedly throughout the day. You should seek shade when your shadow is shorter than you. Protect yourself by wearing long sleeves, pants, a wide-brimmed hat, and sunglasses year round whenever you are outdoors.  Tell your health care provider of new moles  or changes in moles, especially if there is a change in shape or color. Also, tell your health care provider if a mole is larger than the size of a pencil eraser.  A one-time screening for abdominal aortic aneurysm (AAA) and surgical repair of large AAAs by ultrasound is recommended for men aged 34-75 years who are current or former smokers.  Stay current with your vaccines (immunizations).  This information is not intended to replace advice given to you by your health care provider. Make sure you discuss any questions you have with your health care provider.   Document Released: 11/18/2007 Document Revised: 06/12/2014 Document Reviewed: 10/17/2010 Elsevier Interactive Patient Education 2016 [employer] Inc[employer]  

## 2016-02-28 ENCOUNTER — Telehealth: Payer: Self-pay | Admitting: Family

## 2016-02-28 NOTE — Telephone Encounter (Signed)
Lisinopril was discontinued due to c/o cough.  Switched to Diovan / valsartan 80mg  - take 1 tablet daily

## 2016-02-28 NOTE — Telephone Encounter (Signed)
Patient aware of medication change. 

## 2016-03-01 ENCOUNTER — Ambulatory Visit: Payer: Medicare Other | Admitting: Family

## 2016-03-07 ENCOUNTER — Ambulatory Visit (INDEPENDENT_AMBULATORY_CARE_PROVIDER_SITE_OTHER): Payer: Medicare Other | Admitting: Family Medicine

## 2016-03-07 ENCOUNTER — Encounter: Payer: Self-pay | Admitting: Family Medicine

## 2016-03-07 VITALS — BP 107/67 | HR 105 | Temp 97.1°F | Ht 67.5 in | Wt 238.5 lb

## 2016-03-07 DIAGNOSIS — I872 Venous insufficiency (chronic) (peripheral): Secondary | ICD-10-CM

## 2016-03-07 MED ORDER — JOBST ACTIVE 20-30MMHG MEDIUM MISC: 11 refills | Status: DC

## 2016-03-07 MED ORDER — CIPROFLOXACIN HCL 500 MG PO TABS: 500.0000 mg | ORAL_TABLET | Freq: Two times a day (BID) | ORAL | 0 refills | Status: DC

## 2016-03-07 NOTE — Patient Instructions (Signed)
Elevate the right leg so that the ankle is higher than the heart except for essential trips such as going to the restroom etc.

## 2016-03-07 NOTE — Progress Notes (Signed)
Subjective:  Patient ID: Matthew May, male    DOB: June 20, 1945  Age: 70 y.o. MRN: [id number]  CC: Leg Swelling (pt here today c/o right lower leg swelling and red that he noticed about 2 days ago. The redness has gotten worse and is painful.)   HPI [name] presents for Onset 2 days ago with fever and chills. That went away yesterday but today he noted more and more swelling in the right leg from the knee to the ankle. This afternoon he noted more redness in the shin and calf. It is exquisitely painful. He says he always has some swelling ever since he had his open heart surgery. Veins were harvested from the right leg. He keeps it elevated and that helps some but as soon as he puts it down it hurts severely for several minutes until the blood start circulating. He does not work compression stocking History Jermain has a past medical history of CAD (coronary artery disease); Carotid stenosis; COPD (chronic obstructive pulmonary disease) (HCC); HLD (hyperlipidemia); HTN (hypertension); echocardiogram; Leg edema; Obesity; Renal insufficiency; and Subclavian artery stenosis, right ([name]).   He has a past surgical history that includes Hernia repair; Tonsillectomy; Coronary artery bypass graft; and endoscopic vein harvesting.   His family history includes Depression in his maternal aunt; Diabetes in his brother; Heart attack in his father; Heart disease in his father; Mental illness in his mother and sister.He reports that he quit smoking about 6 years ago. He has never used smokeless tobacco. He reports that he does not drink alcohol or use drugs.    ROS Review of Systems  Constitutional: Negative for chills, diaphoresis and fever.  HENT: Negative for rhinorrhea and sore throat.   Respiratory: Negative for cough and shortness of breath.   Cardiovascular: Negative for chest pain.  Gastrointestinal: Negative for abdominal pain.  Musculoskeletal: Negative for arthralgias and myalgias.    Skin: Negative for rash.  Neurological: Negative for weakness and headaches.    Objective:  BP 107/67   Pulse (!) 105   Temp 97.1 F (36.2 C) (Oral)   Ht 5' 7.5" (1.715 m)   Wt 238 lb 8 oz (108.2 kg)   BMI 36.80 kg/m   BP Readings from Last 3 Encounters:  03/07/16 107/67  02/23/16 132/66  11/29/15 135/65    Wt Readings from Last 3 Encounters:  03/07/16 238 lb 8 oz (108.2 kg)  02/23/16 242 lb 8 oz (110 kg)  11/29/15 242 lb 9.6 oz (110 kg)     Physical Exam  Constitutional: He appears well-developed and well-nourished.  HENT:  Head: Normocephalic and atraumatic.  Right Ear: Tympanic membrane and external ear normal. No decreased hearing is noted.  Left Ear: Tympanic membrane and external ear normal. No decreased hearing is noted.  Nose: Mucosal edema present. Right sinus exhibits no frontal sinus tenderness. Left sinus exhibits no frontal sinus tenderness.  Mouth/Throat: No oropharyngeal exudate or posterior oropharyngeal erythema.  Eyes: Pupils are equal, round, and reactive to light.  Neck: Normal range of motion. Neck supple. No Brudzinski's sign noted.  Cardiovascular: Normal rate and regular rhythm.   No murmur heard. Pulmonary/Chest: Breath sounds normal. No respiratory distress.  Abdominal: Soft. Bowel sounds are normal. He exhibits no mass. There is no tenderness.  Lymphadenopathy:       Head (right side): No preauricular adenopathy present.       Head (left side): No preauricular adenopathy present.       Right cervical: No superficial  cervical adenopathy present.      Left cervical: No superficial cervical adenopathy present.  Vitals reviewed.    Lab Results  Component Value Date   WBC 8.8 10/29/2012   HGB 13.9 10/29/2012   HCT 41.2 10/29/2012   PLT 162.0 10/29/2012   GLUCOSE 142 (H) 11/29/2015   CHOL 118 11/29/2015   TRIG 133 11/29/2015   HDL 31 (L) 11/29/2015   LDLCALC 60 11/29/2015   ALT 31 11/29/2015   AST 19 11/29/2015   NA 141 11/29/2015    K 4.9 11/29/2015   CL 100 11/29/2015   CREATININE 0.86 11/29/2015   BUN 13 11/29/2015   CO2 26 11/29/2015   TSH 2.310 11/29/2015   PSA 0.7 12/16/2013   INR 1.17 10/01/2009   HGBA1C 6.9 11/05/2014   MICROALBUR 20 11/05/2014    US Carotid Duplex Bilateral  Result Date: 10/11/2015 CLINICAL DATA:  Follow-up carotid artery stenosis. History of retrograde flow within the right vertebral artery. EXAM: BILATERAL CAROTID DUPLEX ULTRASOUND TECHNIQUE: Pearline Cables scale imaging, color Doppler and duplex ultrasound were performed of bilateral carotid and vertebral arteries in the neck. COMPARISON:  Chest CT - 09/29/2009 FINDINGS: Criteria: Quantification of carotid stenosis is based on velocity parameters that correlate the residual internal carotid diameter with NASCET-based stenosis levels, using the diameter of the distal internal carotid lumen as the denominator for stenosis measurement. The following velocity measurements were obtained: RIGHT ICA:  90/28 cm/sec CCA:  14/78 cm/sec SYSTOLIC ICA/CCA RATIO:  1.0 DIASTOLIC ICA/CCA RATIO:  1.5 ECA:  89 cm/sec LEFT ICA:  95/30 cm/sec CCA:  295/62 cm/sec SYSTOLIC ICA/CCA RATIO:  0.9 DIASTOLIC ICA/CCA RATIO:  1.4 ECA:  86 cm/sec RIGHT CAROTID ARTERY: There is a minimal to moderate amount of eccentric mixed echogenic plaque within the right carotid bulb (images 15 and 17), extending to involve the origin and proximal aspect the right internal carotid artery (image 25, not resulting in elevated peak systolic velocities within the interrogated course the right internal carotid artery to suggest a hemodynamically significant stenosis. RIGHT VERTEBRAL ARTERY:  Retrograde flow (image 36). LEFT CAROTID ARTERY: There is a moderate amount of eccentric mixed echogenic partially shadowing plaque within the left carotid bulb (image 49 and 52), which extends to involve the origin and proximal aspect the left internal carotid artery (59, not resulting in elevated peak systolic velocities  within the interrogated course of the left internal carotid artery to suggest a hemodynamically significant stenosis. LEFT VERTEBRAL ARTERY:  Antegrade Flow IMPRESSION: 1. Moderate amount of bilateral atherosclerotic plaque, not resulting in elevated peak systolic velocities within either internal carotid artery to suggest a hemodynamically significant stenosis. 2. Retrograde flow demonstrated within the right vertebral artery - per report, this is of chronic etiology though a comparison examination is not available at the time of this dictation. Correlation with prior outside examinations is recommended. Retrograde flow within the right vertebral artery could be seen in the setting of right sided subclavian artery steal syndrome and correlation for asymmetric upper extremity blood pressures and posterior fossa symptoms during right upper extremity exercise / activity is recommended. Further evaluation with CTA could performed as clinically indicated. Electronically Signed   By: Sandi Mariscal M.D.   On: 10/11/2015 12:36    Assessment & Plan:   Kensley was seen today for leg swelling.  Diagnoses and all orders for this visit:  Venous stasis dermatitis of right lower extremity -     CBC with Differential/Platelet -     CMP14+EGFR -  D-dimer, quantitative (not at [facility])  Other orders -     ciprofloxacin (CIPRO) 500 MG tablet; Take 1 tablet (500 mg total) by mouth 2 (two) times daily. -     Elastic Bandages & Supports ([place] 20-30MMHG MEDIUM) MISC; Wear daily to support circulation and prevent swelling      I am having Mr. Paris start on ciprofloxacin and JOBST ACTIVE 20-30MMHG MEDIUM. I am also having him maintain his aspirin EC, nitroGLYCERIN, Vitamin D (Ergocalciferol), ONETOUCH DELICA LANCETS 40C, levothyroxine, atorvastatin, metFORMIN, potassium chloride SA, glucose blood, torsemide, valsartan, and albuterol.  Meds ordered this encounter  Medications  . ciprofloxacin (CIPRO) 500 MG  tablet    Sig: Take 1 tablet (500 mg total) by mouth 2 (two) times daily.    Dispense:  14 tablet    Refill:  0  . Elastic Bandages & Supports (JOBST ACTIVE 20-30MMHG MEDIUM) MISC    Sig: Wear daily to support circulation and prevent swelling    Dispense:  2 each    Refill:  11     Follow-up: Return in about 3 days (around 03/10/2016).  Claretta Fraise, M.D.

## 2016-03-08 ENCOUNTER — Telehealth: Payer: Self-pay | Admitting: Family Medicine

## 2016-03-08 LAB — D-DIMER, QUANTITATIVE (NOT AT ARMC): D-DIMER: 0.94 mg/L FEU — ABNORMAL HIGH (ref 0.00–0.49)

## 2016-03-08 LAB — CMP14+EGFR
ALT: 38 IU/L (ref 0–44)
AST: 24 IU/L (ref 0–40)
Albumin/Globulin Ratio: 1.5 (ref 1.2–2.2)
Albumin: 4.4 g/dL (ref 3.6–4.8)
Alkaline Phosphatase: 75 IU/L (ref 39–117)
BUN/Creatinine Ratio: 16 (ref 10–24)
BUN: 20 mg/dL (ref 8–27)
Bilirubin Total: 0.6 mg/dL (ref 0.0–1.2)
CO2: 25 mmol/L (ref 18–29)
Calcium: 9.1 mg/dL (ref 8.6–10.2)
Chloride: 95 mmol/L — ABNORMAL LOW (ref 96–106)
Creatinine, Ser: 1.26 mg/dL (ref 0.76–1.27)
GFR calc Af Amer: 67 mL/min/{1.73_m2} (ref >59)
GFR calc non Af Amer: 58 mL/min/{1.73_m2} — ABNORMAL LOW (ref >59)
Globulin, Total: 3 g/dL (ref 1.5–4.5)
Glucose: 154 mg/dL — ABNORMAL HIGH (ref 65–99)
Potassium: 4.1 mmol/L (ref 3.5–5.2)
Sodium: 139 mmol/L (ref 134–144)
Total Protein: 7.4 g/dL (ref 6.0–8.5)

## 2016-03-08 LAB — CBC WITH DIFFERENTIAL/PLATELET
Basophils Absolute: 0 10*3/uL (ref 0.0–0.2)
Basos: 0 %
EOS (ABSOLUTE): 0.1 10*3/uL (ref 0.0–0.4)
Eos: 1 %
Hematocrit: 38.8 % (ref 37.5–51.0)
Hemoglobin: 13.3 g/dL (ref 12.6–17.7)
Immature Grans (Abs): 0 10*3/uL (ref 0.0–0.1)
Immature Granulocytes: 0 %
Lymphocytes Absolute: 1.3 10*3/uL (ref 0.7–3.1)
Lymphs: 12 %
MCH: 29.9 pg (ref 26.6–33.0)
MCHC: 34.3 g/dL (ref 31.5–35.7)
MCV: 87 fL (ref 79–97)
Monocytes Absolute: 0.7 10*3/uL (ref 0.1–0.9)
Monocytes: 6 %
Neutrophils Absolute: 8.8 10*3/uL — ABNORMAL HIGH (ref 1.4–7.0)
Neutrophils: 81 %
Platelets: 188 10*3/uL (ref 150–379)
RBC: 4.45 x10E6/uL (ref 4.14–5.80)
RDW: 13.6 % (ref 12.3–15.4)
WBC: 10.9 10*3/uL — ABNORMAL HIGH (ref 3.4–10.8)

## 2016-03-08 MED ORDER — CIPROFLOXACIN HCL 500 MG PO TABS: 500.0000 mg | ORAL_TABLET | Freq: Two times a day (BID) | ORAL | 0 refills | Status: DC

## 2016-03-08 NOTE — Telephone Encounter (Signed)
Cipro needed to be sent to [facility] and not mail order. Rx canceled and sent to correct pharmacy.

## 2016-03-13 ENCOUNTER — Ambulatory Visit (INDEPENDENT_AMBULATORY_CARE_PROVIDER_SITE_OTHER): Payer: Medicare Other | Admitting: Family Medicine

## 2016-03-13 ENCOUNTER — Encounter: Payer: Self-pay | Admitting: Family Medicine

## 2016-03-13 VITALS — BP 107/63 | HR 68 | Temp 98.3°F | Ht 67.5 in | Wt 240.0 lb

## 2016-03-13 DIAGNOSIS — I872 Venous insufficiency (chronic) (peripheral): Secondary | ICD-10-CM | POA: Diagnosis not present

## 2016-03-13 DIAGNOSIS — R609 Edema, unspecified: Secondary | ICD-10-CM

## 2016-03-13 DIAGNOSIS — E669 Obesity, unspecified: Secondary | ICD-10-CM

## 2016-03-13 MED ORDER — JOBST ACTIVE 20-30MMHG MEDIUM MISC: 11 refills | Status: DC

## 2016-03-13 NOTE — Progress Notes (Signed)
Subjective:  Patient ID: Matthew May, male    DOB: 01-11-1946  Age: 70 y.o. MRN: [id number]  CC: Venous Stasis Ulcer (pt here today for a recheck on his right lower leg, pt states it has improved quite a bit.)   HPI Matthew May presents for Onset 2 days ago with fever and chills. That went away yesterday but today he noted more and more swelling in the right leg from the knee to the ankle. This afternoon he noted more redness in the shin and calf. It is exquisitely painful. He says he always has some swelling ever since he had his open heart surgery. Veins were harvested from the right leg. He keeps it elevated and that helps some but as soon as he puts it down it hurts severely for several minutes until the blood start circulating. He does not work compression stocking History Matthew May has a past medical history of CAD (coronary artery disease); Carotid stenosis; COPD (chronic obstructive pulmonary disease) (HCC); HLD (hyperlipidemia); HTN (hypertension); echocardiogram; Leg edema; Obesity; Renal insufficiency; and Subclavian artery stenosis, right ([place]).   He has a past surgical history that includes Hernia repair; Tonsillectomy; Coronary artery bypass graft; and endoscopic vein harvesting.   His family history includes Depression in his maternal aunt; Diabetes in his brother; Heart attack in his father; Heart disease in his father; Mental illness in his mother and sister.He reports that he quit smoking about 6 years ago. He has never used smokeless tobacco. He reports that he does not drink alcohol or use drugs.    ROS Review of Systems  Constitutional: Negative for chills, diaphoresis and fever.  HENT: Negative for rhinorrhea and sore throat.   Respiratory: Negative for cough and shortness of breath.   Cardiovascular: Negative for chest pain.  Gastrointestinal: Negative for abdominal pain.  Musculoskeletal: Negative for arthralgias and myalgias.  Skin: Negative for rash.    Neurological: Negative for weakness and headaches.    Objective:  BP 107/63   Pulse 68   Temp 98.3 F (36.8 C) (Oral)   Ht 5' 7.5" (1.715 m)   Wt 240 lb (108.9 kg)   BMI 37.03 kg/m   BP Readings from Last 3 Encounters:  03/13/16 107/63  03/07/16 107/67  02/23/16 132/66    Wt Readings from Last 3 Encounters:  03/13/16 240 lb (108.9 kg)  03/07/16 238 lb 8 oz (108.2 kg)  02/23/16 242 lb 8 oz (110 kg)     Physical Exam  Constitutional: He appears well-developed and well-nourished.  HENT:  Head: Normocephalic and atraumatic.  Right Ear: Tympanic membrane and external ear normal. No decreased hearing is noted.  Left Ear: Tympanic membrane and external ear normal. No decreased hearing is noted.  Nose: Mucosal edema present. Right sinus exhibits no frontal sinus tenderness. Left sinus exhibits no frontal sinus tenderness.  Mouth/Throat: No oropharyngeal exudate or posterior oropharyngeal erythema.  Eyes: Pupils are equal, round, and reactive to light.  Neck: Normal range of motion. Neck supple. No Brudzinski's sign noted.  Cardiovascular: Normal rate and regular rhythm.   No murmur heard. Pulmonary/Chest: Breath sounds normal. No respiratory distress.  Abdominal: Soft. Bowel sounds are normal. He exhibits no mass. There is no tenderness.  Lymphadenopathy:       Head (right side): No preauricular adenopathy present.       Head (left side): No preauricular adenopathy present.       Right cervical: No superficial cervical adenopathy present.      Left cervical:  No superficial cervical adenopathy present.  Vitals reviewed.    Lab Results  Component Value Date   WBC 10.9 (H) 03/07/2016   HGB 13.9 10/29/2012   HCT 38.8 03/07/2016   PLT 188 03/07/2016   GLUCOSE 154 (H) 03/07/2016   CHOL 118 11/29/2015   TRIG 133 11/29/2015   HDL 31 (L) 11/29/2015   LDLCALC 60 11/29/2015   ALT 38 03/07/2016   AST 24 03/07/2016   NA 139 03/07/2016   K 4.1 03/07/2016   CL 95 (L)  03/07/2016   CREATININE 1.26 03/07/2016   BUN 20 03/07/2016   CO2 25 03/07/2016   TSH 2.310 11/29/2015   PSA 0.7 12/16/2013   INR 1.17 10/01/2009   HGBA1C 6.9 11/05/2014   MICROALBUR 20 11/05/2014    US Carotid Duplex Bilateral  Result Date: 10/11/2015 CLINICAL DATA:  Follow-up carotid artery stenosis. History of retrograde flow within the right vertebral artery. EXAM: BILATERAL CAROTID DUPLEX ULTRASOUND TECHNIQUE: Pearline Cables scale imaging, color Doppler and duplex ultrasound were performed of bilateral carotid and vertebral arteries in the neck. COMPARISON:  Chest CT - 09/29/2009 FINDINGS: Criteria: Quantification of carotid stenosis is based on velocity parameters that correlate the residual internal carotid diameter with NASCET-based stenosis levels, using the diameter of the distal internal carotid lumen as the denominator for stenosis measurement. The following velocity measurements were obtained: RIGHT ICA:  90/28 cm/sec CCA:  [id number] cm/sec SYSTOLIC ICA/CCA RATIO:  1.0 DIASTOLIC ICA/CCA RATIO:  1.5 ECA:  89 cm/sec LEFT ICA:  95/30 cm/sec CCA:  [id number] cm/sec SYSTOLIC ICA/CCA RATIO:  0.9 DIASTOLIC ICA/CCA RATIO:  1.4 ECA:  86 cm/sec RIGHT CAROTID ARTERY: There is a minimal to moderate amount of eccentric mixed echogenic plaque within the right carotid bulb (images 15 and 17), extending to involve the origin and proximal aspect the right internal carotid artery (image 25, not resulting in elevated peak systolic velocities within the interrogated course the right internal carotid artery to suggest a hemodynamically significant stenosis. RIGHT VERTEBRAL ARTERY:  Retrograde flow (image 36). LEFT CAROTID ARTERY: There is a moderate amount of eccentric mixed echogenic partially shadowing plaque within the left carotid bulb (image 49 and 52), which extends to involve the origin and proximal aspect the left internal carotid artery (59, not resulting in elevated peak systolic velocities within the interrogated  course of the left internal carotid artery to suggest a hemodynamically significant stenosis. LEFT VERTEBRAL ARTERY:  Antegrade Flow IMPRESSION: 1. Moderate amount of bilateral atherosclerotic plaque, not resulting in elevated peak systolic velocities within either internal carotid artery to suggest a hemodynamically significant stenosis. 2. Retrograde flow demonstrated within the right vertebral artery - per report, this is of chronic etiology though a comparison examination is not available at the time of this dictation. Correlation with prior outside examinations is recommended. Retrograde flow within the right vertebral artery could be seen in the setting of right sided subclavian artery steal syndrome and correlation for asymmetric upper extremity blood pressures and posterior fossa symptoms during right upper extremity exercise / activity is recommended. Further evaluation with CTA could performed as clinically indicated. Electronically Signed   By: Sandi Mariscal M.D.   On: 10/11/2015 12:36    Assessment & Plan:   Matthew May was seen today for venous stasis ulcer.  Diagnoses and all orders for this visit:  Venous stasis dermatitis of right lower extremity  Peripheral edema  Obesity (BMI 30-39.9)  Other orders -     Elastic Bandages & Supports (JOBST ACTIVE 20-30MMHG MEDIUM) MISC;  Wear daily to support circulation and prevent swelling      I am having Matthew May maintain his aspirin EC, nitroGLYCERIN, Vitamin D (Ergocalciferol), ONETOUCH DELICA LANCETS [id number], levothyroxine, atorvastatin, potassium chloride SA, glucose blood, torsemide, valsartan, albuterol, ciprofloxacin, and JOBST ACTIVE 20-30MMHG MEDIUM.  Meds ordered this encounter  Medications  . Elastic Bandages & Supports (JOBST ACTIVE 20-30MMHG MEDIUM) MISC    Sig: Wear daily to support circulation and prevent swelling    Dispense:  2 each    Refill:  11     Follow-up: Return in about 2 weeks (around 03/27/2016).  Claretta Fraise, M.D.

## 2016-03-15 ENCOUNTER — Other Ambulatory Visit: Payer: Self-pay | Admitting: Family

## 2016-04-25 ENCOUNTER — Other Ambulatory Visit: Payer: Self-pay | Admitting: Family

## 2016-05-22 ENCOUNTER — Other Ambulatory Visit: Payer: Self-pay | Admitting: Pharmacist

## 2016-05-22 ENCOUNTER — Other Ambulatory Visit: Payer: Self-pay | Admitting: Family

## 2016-06-15 LAB — HM DIABETES EYE EXAM

## 2016-07-10 ENCOUNTER — Other Ambulatory Visit: Payer: Self-pay | Admitting: Family

## 2016-07-17 ENCOUNTER — Other Ambulatory Visit: Payer: Self-pay | Admitting: Family

## 2016-08-21 ENCOUNTER — Other Ambulatory Visit: Payer: Self-pay | Admitting: Family

## 2016-08-21 MED ORDER — METFORMIN HCL 1000 MG PO TABS: 1000.0000 mg | ORAL_TABLET | Freq: Two times a day (BID) | ORAL | 0 refills | Status: DC

## 2016-08-21 MED ORDER — TORSEMIDE 20 MG PO TABS: 40.0000 mg | ORAL_TABLET | Freq: Every day | ORAL | 0 refills | Status: DC

## 2016-08-28 ENCOUNTER — Other Ambulatory Visit: Payer: Self-pay | Admitting: *Deleted

## 2016-08-28 MED ORDER — ATORVASTATIN CALCIUM 40 MG PO TABS: 40.0000 mg | ORAL_TABLET | Freq: Every day | ORAL | 0 refills | Status: DC

## 2016-08-28 MED ORDER — VALSARTAN 80 MG PO TABS: 80.0000 mg | ORAL_TABLET | Freq: Every day | ORAL | 0 refills | Status: DC

## 2016-09-13 ENCOUNTER — Encounter: Payer: Self-pay | Admitting: Family Medicine

## 2016-09-13 ENCOUNTER — Ambulatory Visit: Payer: Medicare Other | Admitting: Family Medicine

## 2016-09-13 ENCOUNTER — Ambulatory Visit (INDEPENDENT_AMBULATORY_CARE_PROVIDER_SITE_OTHER): Payer: Medicare Other | Admitting: Family Medicine

## 2016-09-13 VITALS — BP 91/67 | HR 76 | Temp 97.4°F | Ht 67.5 in | Wt 232.0 lb

## 2016-09-13 DIAGNOSIS — E1165 Type 2 diabetes mellitus with hyperglycemia: Secondary | ICD-10-CM | POA: Diagnosis not present

## 2016-09-13 DIAGNOSIS — E039 Hypothyroidism, unspecified: Secondary | ICD-10-CM | POA: Diagnosis not present

## 2016-09-13 DIAGNOSIS — E782 Mixed hyperlipidemia: Secondary | ICD-10-CM | POA: Diagnosis not present

## 2016-09-13 LAB — BAYER DCA HB A1C WAIVED: HB A1C (BAYER DCA - WAIVED): 7.2 % — ABNORMAL HIGH (ref <7.0)

## 2016-09-13 LAB — URINALYSIS
Bilirubin, UA: NEGATIVE
Glucose, UA: NEGATIVE
Ketones, UA: NEGATIVE
Leukocytes, UA: NEGATIVE
Nitrite, UA: NEGATIVE
Protein, UA: NEGATIVE
RBC, UA: NEGATIVE
Specific Gravity, UA: 1.01 (ref 1.005–1.030)
Urobilinogen, Ur: 0.2 mg/dL (ref 0.2–1.0)
pH, UA: 5 (ref 5.0–7.5)

## 2016-09-13 NOTE — Progress Notes (Signed)
Subjective:  Patient ID: Matthew May, male    DOB: [birth year]  Age: 71 y.o. MRN: [id number]  CC: Diabetes (pt here today for routine follow up on Diabetes)   HPI Yadir Zentner Marchio presents for  follow-up of hypertension. Patient has no history of headache chest pain or shortness of breath or recent cough. Patient also denies symptoms of TIA such as numbness weakness lateralizing. Patient checks  blood pressure at homerarely. Recent readings have been good Patient denies side effects from medication. States taking it regularly.  Patient also  in for follow-up of elevated cholesterol. Doing well without complaints on current medication. Denies side effects of statin including myalgia and arthralgia and nausea. Also in today for liver function testing. Currently no chest pain, shortness of breath or other cardiovascular related symptoms noted.  Follow-up of diabetes. Patient does check blood sugar at home. Readings are "good."  Patient denies symptoms such as polyuria, polydipsia, excessive hunger, nausea No significant hypoglycemic spells noted. Medications reviewed. Pt reports taking them regularly. Pt. denies complication/adverse reaction today.    History Ashur has a past medical history of CAD (coronary artery disease); Carotid stenosis; COPD (chronic obstructive pulmonary disease) (HCC); HLD (hyperlipidemia); HTN (hypertension); echocardiogram; Leg edema; Obesity; Renal insufficiency; and Subclavian artery stenosis, right ([place]).   He has a past surgical history that includes Hernia repair; Tonsillectomy; Coronary artery bypass graft; and endoscopic vein harvesting.   His family history includes Depression in his maternal aunt; Diabetes in his brother; Heart attack in his father; Heart disease in his father; Mental illness in his mother and sister.He reports that he quit smoking about 7 years ago. He has never used smokeless tobacco. He reports that he does not drink alcohol or use  drugs.  Current Outpatient Prescriptions on File Prior to Visit  Medication Sig Dispense Refill  . albuterol (PROVENTIL HFA;VENTOLIN HFA) 108 (90 Base) MCG/ACT inhaler Inhale 2 puffs into the lungs every 6 (six) hours as needed for wheezing or shortness of breath. 1 Inhaler 1  . aspirin EC 81 MG tablet Take 81 mg by mouth daily.    Marland Kitchen atorvastatin (LIPITOR) 40 MG tablet Take 1 tablet (40 mg total) by mouth daily. 90 tablet 0  . Elastic Bandages & Supports (JOBST ACTIVE 20-30MMHG MEDIUM) MISC Wear daily to support circulation and prevent swelling 2 each 11  . glucose blood (ONETOUCH VERIO) test strip Test sugar qd. DX E11.9 300 each 1  . levothyroxine (SYNTHROID, LEVOTHROID) 125 MCG tablet TAKE 1 TABLET BY MOUTH  DAILY 90 tablet 0  . metFORMIN (GLUCOPHAGE) 1000 MG tablet Take 1 tablet (1,000 mg total) by mouth 2 (two) times daily. 180 tablet 0  . nitroGLYCERIN (NITROSTAT) 0.4 MG SL tablet Place 1 tablet (0.4 mg total) under the tongue every 5 (five) minutes as needed for chest pain. 25 tablet 3  . ONETOUCH DELICA LANCETS 78H MISC Use one daily 300 each 3  . potassium chloride SA (K-DUR,KLOR-CON) 20 MEQ tablet Take 1 tablet by mouth once daily 90 tablet 3  . torsemide (DEMADEX) 20 MG tablet Take 2 tablets (40 mg total) by mouth daily. 180 tablet 0  . valsartan (DIOVAN) 80 MG tablet Take 1 tablet (80 mg total) by mouth daily. 90 tablet 0  . Vitamin D, Ergocalciferol, (DRISDOL) 50000 units CAPS capsule Take 1 capsule (50,000 Units total) by mouth once a week. 12 capsule 1   No current facility-administered medications on file prior to visit.     ROS Review of  Systems  Constitutional: Negative for chills, diaphoresis, fever and unexpected weight change.  HENT: Negative for congestion, hearing loss, rhinorrhea and sore throat.   Eyes: Negative for visual disturbance.  Respiratory: Negative for cough and shortness of breath.   Cardiovascular: Negative for chest pain.  Gastrointestinal: Negative  for abdominal pain, constipation and diarrhea.  Genitourinary: Negative for dysuria and flank pain.  Musculoskeletal: Negative for arthralgias and joint swelling.  Skin: Negative for rash.  Neurological: Negative for dizziness and headaches.  Psychiatric/Behavioral: Negative for dysphoric mood and sleep disturbance.    Objective:  BP 91/67   Pulse 76   Temp 97.4 F (36.3 C) (Oral)   Ht 5' 7.5" (1.715 m)   Wt 232 lb (105.2 kg)   BMI 35.80 kg/m   BP Readings from Last 3 Encounters:  09/13/16 91/67  03/13/16 107/63  03/07/16 107/67    Wt Readings from Last 3 Encounters:  09/13/16 232 lb (105.2 kg)  03/13/16 240 lb (108.9 kg)  03/07/16 238 lb 8 oz (108.2 kg)     Physical Exam  Constitutional: He is oriented to person, place, and time. He appears well-developed and well-nourished. No distress.  HENT:  Head: Normocephalic and atraumatic.  Right Ear: External ear normal.  Left Ear: External ear normal.  Nose: Nose normal.  Mouth/Throat: Oropharynx is clear and moist.  Eyes: Conjunctivae and EOM are normal. Pupils are equal, round, and reactive to light.  Neck: Normal range of motion. Neck supple. No thyromegaly present.  Cardiovascular: Normal rate, regular rhythm and normal heart sounds.   No murmur heard. Pulmonary/Chest: Effort normal and breath sounds normal. No respiratory distress. He has no wheezes. He has no rales.  Abdominal: Soft. Bowel sounds are normal. He exhibits no distension. There is no tenderness.  Lymphadenopathy:    He has no cervical adenopathy.  Neurological: He is alert and oriented to person, place, and time. He has normal reflexes.  Skin: Skin is warm and dry.  Psychiatric: He has a normal mood and affect. His behavior is normal. Judgment and thought content normal.    No components found for: BAYER DCA HB A1C WAIVED    Assessment & Plan:   Bubber was seen today for diabetes.  Diagnoses and all orders for this visit:  Mixed  hyperlipidemia  Hypothyroidism, unspecified type -     TSH + free T4  Type 2 diabetes mellitus with hyperglycemia, without long-term current use of insulin (HCC) -     Microalbumin / creatinine urine ratio -     Urinalysis -     [place] DCA Hb A1c Waived -     Lipid panel -     CMP14+EGFR -     TSH + free T4   I have discontinued Mr. Rarick's ciprofloxacin. I am also having him maintain his aspirin EC, nitroGLYCERIN, Vitamin D (Ergocalciferol), ONETOUCH DELICA LANCETS 51V, potassium chloride SA, glucose blood, albuterol, JOBST ACTIVE 20-30MMHG MEDIUM, levothyroxine, torsemide, metFORMIN, atorvastatin, and valsartan.  No orders of the defined types were placed in this encounter.    Follow-up: Return in about 3 months (around 12/13/2016).  Claretta Fraise, M.D.

## 2016-09-14 LAB — CMP14+EGFR
ALT: 33 IU/L (ref 0–44)
AST: 23 IU/L (ref 0–40)
Albumin/Globulin Ratio: 1.5 (ref 1.2–2.2)
Albumin: 4.4 g/dL (ref 3.6–4.8)
Alkaline Phosphatase: 82 IU/L (ref 39–117)
BUN/Creatinine Ratio: 24 (ref 10–24)
BUN: 20 mg/dL (ref 8–27)
Bilirubin Total: 0.6 mg/dL (ref 0.0–1.2)
CO2: 25 mmol/L (ref 18–29)
Calcium: 9.1 mg/dL (ref 8.6–10.2)
Chloride: 95 mmol/L — ABNORMAL LOW (ref 96–106)
Creatinine, Ser: 0.85 mg/dL (ref 0.76–1.27)
GFR calc Af Amer: 103 mL/min/{1.73_m2} (ref >59)
GFR calc non Af Amer: 89 mL/min/{1.73_m2} (ref >59)
Globulin, Total: 3 g/dL (ref 1.5–4.5)
Glucose: 139 mg/dL — ABNORMAL HIGH (ref 65–99)
Potassium: 4.3 mmol/L (ref 3.5–5.2)
Sodium: 139 mmol/L (ref 134–144)
Total Protein: 7.4 g/dL (ref 6.0–8.5)

## 2016-09-14 LAB — MICROALBUMIN / CREATININE URINE RATIO
Creatinine, Urine: 21.9 mg/dL
Microalb/Creat Ratio: <13.7 mg/g creat (ref 0.0–30.0)
Microalbumin, Urine: <3 ug/mL

## 2016-09-14 LAB — LIPID PANEL
Chol/HDL Ratio: 4.4 ratio (ref 0.0–5.0)
Cholesterol, Total: 137 mg/dL (ref 100–199)
HDL: 31 mg/dL — ABNORMAL LOW (ref >39)
LDL Calculated: 78 mg/dL (ref 0–99)
Triglycerides: 141 mg/dL (ref 0–149)
VLDL Cholesterol Cal: 28 mg/dL (ref 5–40)

## 2016-09-14 LAB — TSH+FREE T4
Free T4: 1.42 ng/dL (ref 0.82–1.77)
TSH: 1.28 u[IU]/mL (ref 0.450–4.500)

## 2016-10-12 ENCOUNTER — Other Ambulatory Visit: Payer: Self-pay | Admitting: Family Medicine

## 2016-10-12 NOTE — Telephone Encounter (Signed)
Vit D level was last checked 11/2014

## 2016-10-24 ENCOUNTER — Other Ambulatory Visit: Payer: Self-pay | Admitting: Family Medicine

## 2016-10-26 ENCOUNTER — Other Ambulatory Visit: Payer: Self-pay | Admitting: Family Medicine

## 2016-10-27 NOTE — Telephone Encounter (Signed)
Last vit D level was 11/05/14

## 2016-10-30 NOTE — Progress Notes (Signed)
HPI The patient returns for followup of his known coronary disease.  He has had an extensive workup in 2014 with a nuclear study and echo.   Since I last saw him he has done well.  He already is harvesting squash from his garden.  The patient denies any new symptoms such as chest discomfort, neck or arm discomfort. There has been no new shortness of breath, PND or orthopnea. There have been no reported palpitations, presyncope or syncope.  He does have some mild left leg pain with ambulation at times but this is not severe.  Allergies  Allergen Reactions  . Lisinopril Cough    Current Outpatient Prescriptions  Medication Sig Dispense Refill  . albuterol (PROVENTIL HFA;VENTOLIN HFA) 108 (90 Base) MCG/ACT inhaler Inhale 2 puffs into the lungs every 6 (six) hours as needed for wheezing or shortness of breath. 1 Inhaler 1  . aspirin EC 81 MG tablet Take 81 mg by mouth daily.    Marland Kitchen atorvastatin (LIPITOR) 40 MG tablet Take 1 tablet (40 mg total) by mouth daily. 90 tablet 0  . levothyroxine (SYNTHROID, LEVOTHROID) 125 MCG tablet TAKE 1 TABLET BY MOUTH  DAILY 90 tablet 0  . metFORMIN (GLUCOPHAGE) 1000 MG tablet Take 1 tablet (1,000 mg total) by mouth 2 (two) times daily. 180 tablet 0  . potassium chloride SA (K-DUR,KLOR-CON) 20 MEQ tablet Take 1 tablet by mouth once daily 90 tablet 3  . torsemide (DEMADEX) 20 MG tablet Take 2 tablets (40 mg total) by mouth daily. 180 tablet 0  . valsartan (DIOVAN) 80 MG tablet Take 1 tablet (80 mg total) by mouth daily. 90 tablet 0  . Vitamin D, Ergocalciferol, (DRISDOL) 50000 units CAPS capsule Take 1 capsule (50,000 Units total) by mouth once a week. 12 capsule 1  . Elastic Bandages & Supports (JOBST ACTIVE 20-30MMHG MEDIUM) MISC Wear daily to support circulation and prevent swelling 2 each 11  . glucose blood (ONETOUCH VERIO) test strip Test sugar qd. DX E11.9 300 each 1  . nitroGLYCERIN (NITROSTAT) 0.4 MG SL tablet Place 1 tablet (0.4 mg total) under the tongue  every 5 (five) minutes as needed for chest pain. 25 tablet prn  . ONETOUCH DELICA LANCETS 10C MISC Use one daily 300 each 3   No current facility-administered medications for this visit.     Past Medical History:  Diagnosis Date  . CAD (coronary artery disease)    a. s/p NSTEMI 4/11 => s/p CABG (L-LAD, S-OM2, S-PDA/PL);  b.  ETT-Myoview 6/14:  normal study, no ischemia, EF 67%  . Carotid stenosis    Carotid U/S 5/85:  RICA 2-77%, LICA 82-42%; right vertebral flow retrograde suggestive of steel-consider PV consult  . COPD (chronic obstructive pulmonary disease) ([place])   . HLD (hyperlipidemia)   . HTN (hypertension)   . Hx of echocardiogram    Echo 6/14: Mod LVH, focal basal hypertrophy, EF 50-55%, mild AS (mean 17 mmHg) and mild AI, mild to mod LAE, mild RAE  . Leg edema   . Obesity   . Renal insufficiency   . Subclavian artery stenosis, right ([place])    based upon carotid U/S done 11/2012    Past Surgical History:  Procedure Laterality Date  . CORONARY ARTERY BYPASS GRAFT     2011, LIMA to LAD coronary artery, SVG to OM2 branch of lect circumflex coronary artery, and a sequential SVG to psot descening to posterolateral branches to RCA  . endoscopic vein harvesting     right leg   .  HERNIA REPAIR    . TONSILLECTOMY      ROS:  As stated in the HPI and negative for all other systems.  PHYSICAL EXAM BP 116/62   Pulse 70   Ht 5' 7.5" (1.715 m)   Wt 227 lb (103 kg)   BMI 35.03 kg/m   GENERAL:  Well appearing NECK:  No jugular venous distention, waveform within normal limits, carotid upstroke brisk and symmetric, no bruits, no thyromegaly LUNGS:  Clear to auscultation bilaterally BACK:  No CVA tenderness CHEST:  Well healed sternotomy scar. HEART:  PMI not displaced or sustained,S1 and S2 within normal limits, no S3, no S4, no clicks, no rubs, 2 out of 6 apical systolic murmur radiating slightly out the aortic tract, no diastolic murmurs ABD:  Flat, positive bowel sounds normal  in frequency in pitch, no bruits, no rebound, no guarding, no midline pulsatile mass, no hepatomegaly, no splenomegaly EXT:  2 plus pulses upper and decreased left DP/PT, trace edema, no cyanosis no clubbing SKIN:  No rashes no nodules, mild chronic venous stasis changes   EKG: Sinus rhythm, rate 69, axis within normal limits, intervals within normal limits, no acute ST-T wave changes. PVCs PVCs. 11/01/2016   Lab Results  Component Value Date   CHOL 137 09/13/2016   TRIG 141 09/13/2016   HDL 31 (L) 09/13/2016   LDLCALC 78 09/13/2016     ASSESSMENT AND PLAN  CORONARY ATHEROSCLEROSIS NATIVE CORONARY ARTERY -  He's had no new symptoms consistent with angina since his stress test in 2014.   I will continue with risk reduction.  No further testing is indicated this year.   HYPERTENSION -  The blood pressure is at target. No change in medications is indicated. We will continue with therapeutic lifestyle changes (TLC).  DYSLIPIDEMIA -   LDL was 78.  He will remain on the meds as listed.  He will continue on meds as listed.   AS - I will order an echo to follow up.  CAROTID STENOSIS - He is due for follow up.  I will order this.  OBESITY - He has lost some weight and I am proud of this and I encouraged more of the same.

## 2016-10-31 ENCOUNTER — Encounter: Payer: Self-pay | Admitting: Cardiology

## 2016-11-01 ENCOUNTER — Ambulatory Visit (INDEPENDENT_AMBULATORY_CARE_PROVIDER_SITE_OTHER): Payer: Medicare Other | Admitting: Cardiology

## 2016-11-01 ENCOUNTER — Encounter: Payer: Self-pay | Admitting: Cardiology

## 2016-11-01 VITALS — BP 116/62 | HR 70 | Ht 67.5 in | Wt 227.0 lb

## 2016-11-01 DIAGNOSIS — I2510 Atherosclerotic heart disease of native coronary artery without angina pectoris: Secondary | ICD-10-CM | POA: Diagnosis not present

## 2016-11-01 DIAGNOSIS — I6523 Occlusion and stenosis of bilateral carotid arteries: Secondary | ICD-10-CM | POA: Diagnosis not present

## 2016-11-01 DIAGNOSIS — I350 Nonrheumatic aortic (valve) stenosis: Secondary | ICD-10-CM | POA: Insufficient documentation

## 2016-11-01 DIAGNOSIS — R0789 Other chest pain: Secondary | ICD-10-CM | POA: Diagnosis not present

## 2016-11-01 MED ORDER — NITROGLYCERIN 0.4 MG SL SUBL: 0.4000 mg | SUBLINGUAL_TABLET | SUBLINGUAL | 99 refills | Status: DC | PRN

## 2016-11-01 NOTE — Patient Instructions (Signed)
Medication Instructions:  The current medical regimen is effective;  continue present plan and medications.  Testing/Procedures: Your physician has requested that you have an echocardiogram. Echocardiography is a painless test that uses sound waves to create images of your heart. It provides your doctor with information about the size and shape of your heart and how well your heart's chambers and valves are working. This procedure takes approximately one hour. There are no restrictions for this procedure.  Your physician has requested that you have a carotid duplex. This test is an ultrasound of the carotid arteries in your neck. It looks at blood flow through these arteries that supply the brain with blood. Allow one hour for this exam. There are no restrictions or special instructions.  Follow-Up: Follow up in 1 year with Dr. Percival Spanish.  You will receive a letter in the mail 2 months before you are due.  Please call us when you receive this letter to schedule your follow up appointment.  If you need a refill on your cardiac medications before your next appointment, please call your pharmacy.  Thank you for choosing [facility]!!

## 2016-11-06 ENCOUNTER — Ambulatory Visit (HOSPITAL_COMMUNITY)
Admission: RE | Admit: 2016-11-06 | Discharge: 2016-11-06 | Disposition: A | Discharge status: Home / Self Care | Payer: Medicare Other | Source: Ambulatory Visit | Attending: Cardiology | Admitting: Cardiology

## 2016-11-06 DIAGNOSIS — I6523 Occlusion and stenosis of bilateral carotid arteries: Secondary | ICD-10-CM | POA: Insufficient documentation

## 2016-11-06 DIAGNOSIS — I082 Rheumatic disorders of both aortic and tricuspid valves: Secondary | ICD-10-CM | POA: Insufficient documentation

## 2016-11-06 DIAGNOSIS — E669 Obesity, unspecified: Secondary | ICD-10-CM | POA: Diagnosis not present

## 2016-11-06 DIAGNOSIS — E119 Type 2 diabetes mellitus without complications: Secondary | ICD-10-CM | POA: Insufficient documentation

## 2016-11-06 DIAGNOSIS — I2510 Atherosclerotic heart disease of native coronary artery without angina pectoris: Secondary | ICD-10-CM | POA: Insufficient documentation

## 2016-11-06 DIAGNOSIS — E785 Hyperlipidemia, unspecified: Secondary | ICD-10-CM | POA: Insufficient documentation

## 2016-11-06 DIAGNOSIS — I350 Nonrheumatic aortic (valve) stenosis: Secondary | ICD-10-CM

## 2016-11-06 DIAGNOSIS — Z6835 Body mass index (BMI) 35.0-35.9, adult: Secondary | ICD-10-CM | POA: Diagnosis not present

## 2016-11-06 LAB — ECHOCARDIOGRAM COMPLETE
AO mean calculated velocity dopler: 264 cm/s
AV Area VTI index: 0.32 cm2/m2
AV Area VTI: 0.78 cm2
AV Area mean vel: 0.7 cm2
AV Mean grad: 31 mmHg
AV Peak grad: 52 mmHg
AV VEL mean LVOT/AV: 0.25
AV area mean vel ind: 0.31 cm2/m2
AV peak Index: 0.35
AV pk vel: 360 cm/s
AV vel: 0.72
Ao pk vel: 0.28 m/s
E decel time: 268 msec
E/e' ratio: 12.42
FS: 25 % — AB (ref 28–44)
IVS/LV PW RATIO, ED: 1.18
LA ID, A-P, ES: 48 mm
LA diam end sys: 48 mm
LA diam index: 2.13 cm/m2
LA vol A4C: 51.7 ml
LA vol index: 21.6 mL/m2
LA vol: 48.6 mL
LV E/e' medial: 12.42
LV E/e'average: 12.42
LV PW d: 12.2 mm — AB (ref 0.6–1.1)
LV dias vol index: 38 mL/m2
LV dias vol: 85 mL (ref 62–150)
LV e' LATERAL: 8.05 cm/s
LV sys vol index: 16 mL/m2
LV sys vol: 36 mL (ref 21–61)
LVOT SV: 60 mL
LVOT VTI: 21 cm
LVOT area: 2.84 cm2
LVOT diameter: 19 mm
LVOT peak VTI: 0.25 cm
LVOT peak grad rest: 4 mmHg
LVOT peak vel: 99 cm/s
Lateral S' vel: 9.68 cm/s
MV Dec: 268
MV Peak grad: 4 mmHg
MV pk A vel: 123 m/s
MV pk E vel: 100 m/s
P 1/2 time: 445 ms
Simpson's disk: 58
Stroke v: 49 ml
TAPSE: 13.1 mm
TDI e' lateral: 8.05
TDI e' medial: 6.42
VTI: 82.7 cm
Valve area index: 0.32
Valve area: 0.72 cm2

## 2016-11-06 NOTE — Progress Notes (Signed)
*  PRELIMINARY RESULTS* Echocardiogram 2D Echocardiogram has been performed.  Leavy Cella 11/06/2016, 3:20 PM

## 2016-11-13 ENCOUNTER — Telehealth: Payer: Self-pay | Admitting: Cardiology

## 2016-11-13 NOTE — Telephone Encounter (Signed)
Patient returning  Your call,thanks.

## 2016-11-13 NOTE — Telephone Encounter (Signed)
Close encounter 

## 2016-11-13 NOTE — Telephone Encounter (Signed)
PT AWARE  OF ECHO  AND  CAROTID RESULTS .Matthew May

## 2016-12-18 ENCOUNTER — Ambulatory Visit: Payer: Medicare Other | Admitting: Family Medicine

## 2016-12-19 ENCOUNTER — Encounter: Payer: Self-pay | Admitting: Family

## 2016-12-19 ENCOUNTER — Ambulatory Visit (INDEPENDENT_AMBULATORY_CARE_PROVIDER_SITE_OTHER): Payer: Medicare Other | Admitting: Family

## 2016-12-19 VITALS — BP 106/53 | HR 68 | Temp 97.2°F | Ht 67.5 in | Wt 227.0 lb

## 2016-12-19 DIAGNOSIS — E669 Obesity, unspecified: Secondary | ICD-10-CM | POA: Diagnosis not present

## 2016-12-19 DIAGNOSIS — E1165 Type 2 diabetes mellitus with hyperglycemia: Secondary | ICD-10-CM

## 2016-12-19 DIAGNOSIS — Z1211 Encounter for screening for malignant neoplasm of colon: Secondary | ICD-10-CM | POA: Diagnosis not present

## 2016-12-19 DIAGNOSIS — E039 Hypothyroidism, unspecified: Secondary | ICD-10-CM

## 2016-12-19 DIAGNOSIS — E559 Vitamin D deficiency, unspecified: Secondary | ICD-10-CM

## 2016-12-19 DIAGNOSIS — E782 Mixed hyperlipidemia: Secondary | ICD-10-CM

## 2016-12-19 LAB — BAYER DCA HB A1C WAIVED: HB A1C (BAYER DCA - WAIVED): 7 % — ABNORMAL HIGH (ref <7.0)

## 2016-12-19 NOTE — Progress Notes (Signed)
Subjective:    Patient ID: Matthew May, male    DOB: May 05, 1946, 71 y.o.   MRN: [id number]  Pt presents to the office today for chronic follow up. Pt is followed by Cardiologists annually for CAD. Pt's granddaughter is with pt today.  Hyperlipidemia  This is a chronic problem. The current episode started more than 1 year ago. The problem is controlled. Recent lipid tests were reviewed and are normal. Exacerbating diseases include obesity. Current antihyperlipidemic treatment includes statins. The current treatment provides moderate improvement of lipids. Risk factors for coronary artery disease include dyslipidemia, diabetes mellitus, hypertension, a sedentary lifestyle, male sex and obesity.  Diabetes  He presents for his follow-up diabetic visit. He has type 2 diabetes mellitus. His disease course has been stable. There are no hypoglycemic associated symptoms. Associated symptoms include fatigue. Pertinent negatives for diabetes include no blurred vision, no foot paresthesias and no visual change. There are no hypoglycemic complications. Symptoms are stable. Diabetic complications include heart disease. Pertinent negatives for diabetic complications include no peripheral neuropathy. Risk factors for coronary artery disease include diabetes mellitus, dyslipidemia, male sex, obesity and sedentary lifestyle. His breakfast blood glucose range is generally 110-130 mg/dl. An ACE inhibitor/angiotensin II receptor blocker is being taken. Eye exam is not current.  Thyroid Problem  Presents for follow-up visit. Symptoms include constipation, fatigue and hoarse voice. Patient reports no cold intolerance, depressed mood, diarrhea or visual change. The symptoms have been stable. His past medical history is significant for hyperlipidemia.      Review of Systems  Constitutional: Positive for fatigue.  HENT: Positive for hoarse voice.   Eyes: Negative for blurred vision.  Gastrointestinal: Positive for  constipation. Negative for diarrhea.  Endocrine: Negative for cold intolerance.  All other systems reviewed and are negative.      Objective:   Physical Exam  Constitutional: He is oriented to person, place, and time. He appears well-developed and well-nourished. No distress.  HENT:  Head: Normocephalic.  Right Ear: External ear normal.  Left Ear: External ear normal.  Nose: Nose normal.  Mouth/Throat: Oropharynx is clear and moist.  Eyes: Pupils are equal, round, and reactive to light. Right eye exhibits no discharge. Left eye exhibits no discharge.  Neck: Normal range of motion. Neck supple. No thyromegaly present.  Cardiovascular: Normal rate, regular rhythm and intact distal pulses.   Murmur heard. Pulmonary/Chest: Effort normal and breath sounds normal. No respiratory distress. He has no wheezes.  Abdominal: Soft. Bowel sounds are normal. He exhibits no distension. There is no tenderness.  Musculoskeletal: Normal range of motion. He exhibits edema (trace LLE). He exhibits no tenderness.  Neurological: He is alert and oriented to person, place, and time.  Skin: Skin is warm and dry. No rash noted. No erythema.  Psychiatric: He has a normal mood and affect. His behavior is normal. Judgment and thought content normal.  Vitals reviewed.  Diabetic Foot Exam - Simple   Simple Foot Form Diabetic Foot exam was performed with the following findings:  Yes 12/19/2016  8:45 AM  Visual Inspection No deformities, no ulcerations, no other skin breakdown bilaterally:  Yes Sensation Testing Intact to touch and monofilament testing bilaterally:  Yes Pulse Check Posterior Tibialis and Dorsalis pulse intact bilaterally:  Yes Comments      BP (!) 106/53   Pulse 68   Temp (!) 97.2 F (36.2 C) (Oral)   Ht 5' 7.5" (1.715 m)   Wt 227 lb (103 kg)   BMI 35.03 kg/m  Assessment & Plan:  1. Hypothyroidism, unspecified type - CMP14+EGFR - Thyroid Panel With TSH  2. Type 2 diabetes  mellitus with hyperglycemia, without long-term current use of insulin (HCC) - CMP14+EGFR - Bayer DCA Hb A1c Waived - Ambulatory referral to Ophthalmology  3. Mixed hyperlipidemia  - CMP14+EGFR - Lipid panel  4. Obesity (BMI 30-39.9) - CMP14+EGFR  5. Vitamin D deficiency - CMP14+EGFR  6. Colon cancer screening - Fecal occult blood, imunochemical; Future   Continue all meds Labs pending Health Maintenance reviewed Diet and exercise encouraged RTO 6 months   Evelina Dun, FNP

## 2016-12-19 NOTE — Patient Instructions (Signed)
Diabetes Mellitus and Food It is important for you to manage your blood sugar (glucose) level. Your blood glucose level can be greatly affected by what you eat. Eating healthier foods in the appropriate amounts throughout the day at about the same time each day will help you control your blood glucose level. It can also help slow or prevent worsening of your diabetes mellitus. Healthy eating may even help you improve the level of your blood pressure and reach or maintain a healthy weight. General recommendations for healthful eating and cooking habits include:  Eating meals and snacks regularly. Avoid going long periods of time without eating to lose weight.  Eating a diet that consists mainly of plant-based foods, such as fruits, vegetables, nuts, legumes, and whole grains.  Using low-heat cooking methods, such as baking, instead of high-heat cooking methods, such as deep frying.  Work with your dietitian to make sure you understand how to use the Nutrition Facts information on food labels. How can food affect me? Carbohydrates Carbohydrates affect your blood glucose level more than any other type of food. Your dietitian will help you determine how many carbohydrates to eat at each meal and teach you how to count carbohydrates. Counting carbohydrates is important to keep your blood glucose at a healthy level, especially if you are using insulin or taking certain medicines for diabetes mellitus. Alcohol Alcohol can cause sudden decreases in blood glucose (hypoglycemia), especially if you use insulin or take certain medicines for diabetes mellitus. Hypoglycemia can be a life-threatening condition. Symptoms of hypoglycemia (sleepiness, dizziness, and disorientation) are similar to symptoms of having too much alcohol. If your health care provider has given you approval to drink alcohol, do so in moderation and use the following guidelines:  Women should not have more than one drink per day, and men  should not have more than two drinks per day. One drink is equal to: ? 12 oz of beer. ? 5 oz of wine. ? 1 oz of hard liquor.  Do not drink on an empty stomach.  Keep yourself hydrated. Have water, diet soda, or unsweetened iced tea.  Regular soda, juice, and other mixers might contain a lot of carbohydrates and should be counted.  What foods are not recommended? As you make food choices, it is important to remember that all foods are not the same. Some foods have fewer nutrients per serving than other foods, even though they might have the same number of calories or carbohydrates. It is difficult to get your body what it needs when you eat foods with fewer nutrients. Examples of foods that you should avoid that are high in calories and carbohydrates but low in nutrients include:  Trans fats (most processed foods list trans fats on the Nutrition Facts label).  Regular soda.  Juice.  Candy.  Sweets, such as cake, pie, doughnuts, and cookies.  Fried foods.  What foods can I eat? Eat nutrient-rich foods, which will nourish your body and keep you healthy. The food you should eat also will depend on several factors, including:  The calories you need.  The medicines you take.  Your weight.  Your blood glucose level.  Your blood pressure level.  Your cholesterol level.  You should eat a variety of foods, including:  Protein. ? Lean cuts of meat. ? Proteins low in saturated fats, such as fish, egg whites, and beans. Avoid processed meats.  Fruits and vegetables. ? Fruits and vegetables that may help control blood glucose levels, such as apples,   mangoes, and yams.  Dairy products. ? Choose fat-free or low-fat dairy products, such as milk, yogurt, and cheese.  Grains, bread, pasta, and rice. ? Choose whole grain products, such as multigrain bread, whole oats, and brown rice. These foods may help control blood pressure.  Fats. ? Foods containing healthful fats, such as  nuts, avocado, olive oil, canola oil, and fish.  Does everyone with diabetes mellitus have the same meal plan? Because every person with diabetes mellitus is different, there is not one meal plan that works for everyone. It is very important that you meet with a dietitian who will help you create a meal plan that is just right for you. This information is not intended to replace advice given to you by your health care provider. Make sure you discuss any questions you have with your health care provider. Document Released: 02/16/2005 Document Revised: 10/28/2015 Document Reviewed: 04/18/2013 [employer] Interactive Patient Education  2017 [employer]  

## 2016-12-20 LAB — CMP14+EGFR
ALT: 23 IU/L (ref 0–44)
AST: 17 IU/L (ref 0–40)
Albumin/Globulin Ratio: 1.6 (ref 1.2–2.2)
Albumin: 4.2 g/dL (ref 3.6–4.8)
Alkaline Phosphatase: 67 IU/L (ref 39–117)
BUN/Creatinine Ratio: 19 (ref 10–24)
BUN: 16 mg/dL (ref 8–27)
Bilirubin Total: 0.4 mg/dL (ref 0.0–1.2)
CO2: 23 mmol/L (ref 20–29)
Calcium: 9.2 mg/dL (ref 8.6–10.2)
Chloride: 99 mmol/L (ref 96–106)
Creatinine, Ser: 0.84 mg/dL (ref 0.76–1.27)
GFR calc Af Amer: 103 mL/min/{1.73_m2} (ref >59)
GFR calc non Af Amer: 89 mL/min/{1.73_m2} (ref >59)
Globulin, Total: 2.6 g/dL (ref 1.5–4.5)
Glucose: 128 mg/dL — ABNORMAL HIGH (ref 65–99)
Potassium: 4.4 mmol/L (ref 3.5–5.2)
Sodium: 142 mmol/L (ref 134–144)
Total Protein: 6.8 g/dL (ref 6.0–8.5)

## 2016-12-20 LAB — THYROID PANEL WITH TSH
Free Thyroxine Index: 2.3 (ref 1.2–4.9)
T3 Uptake Ratio: 27 % (ref 24–39)
T4, Total: 8.4 ug/dL (ref 4.5–12.0)
TSH: 0.839 u[IU]/mL (ref 0.450–4.500)

## 2016-12-20 LAB — LIPID PANEL
Chol/HDL Ratio: 3.4 ratio (ref 0.0–5.0)
Cholesterol, Total: 106 mg/dL (ref 100–199)
HDL: 31 mg/dL — ABNORMAL LOW (ref >39)
LDL Calculated: 55 mg/dL (ref 0–99)
Triglycerides: 100 mg/dL (ref 0–149)
VLDL Cholesterol Cal: 20 mg/dL (ref 5–40)

## 2017-01-09 ENCOUNTER — Other Ambulatory Visit: Payer: Self-pay | Admitting: Cardiology

## 2017-01-09 ENCOUNTER — Other Ambulatory Visit: Payer: Self-pay | Admitting: Family

## 2017-01-09 ENCOUNTER — Other Ambulatory Visit: Payer: Self-pay | Admitting: Family Medicine

## 2017-01-12 ENCOUNTER — Other Ambulatory Visit: Payer: Self-pay | Admitting: Family Medicine

## 2017-01-16 ENCOUNTER — Emergency Department (HOSPITAL_COMMUNITY)
Admission: EM | Admit: 2017-01-16 | Discharge: 2017-01-17 | Disposition: A | Discharge status: Home / Self Care | Payer: Medicare Other | Source: Home / Self Care | Attending: Emergency Medicine | Admitting: Emergency Medicine

## 2017-01-16 ENCOUNTER — Encounter (HOSPITAL_COMMUNITY): Payer: Self-pay | Admitting: *Deleted

## 2017-01-16 DIAGNOSIS — M109 Gout, unspecified: Secondary | ICD-10-CM

## 2017-01-16 DIAGNOSIS — Z888 Allergy status to other drugs, medicaments and biological substances status: Secondary | ICD-10-CM | POA: Diagnosis not present

## 2017-01-16 DIAGNOSIS — M10072 Idiopathic gout, left ankle and foot: Secondary | ICD-10-CM | POA: Insufficient documentation

## 2017-01-16 DIAGNOSIS — L03115 Cellulitis of right lower limb: Secondary | ICD-10-CM

## 2017-01-16 DIAGNOSIS — E119 Type 2 diabetes mellitus without complications: Secondary | ICD-10-CM | POA: Insufficient documentation

## 2017-01-16 DIAGNOSIS — I350 Nonrheumatic aortic (valve) stenosis: Secondary | ICD-10-CM | POA: Diagnosis not present

## 2017-01-16 DIAGNOSIS — Z7982 Long term (current) use of aspirin: Secondary | ICD-10-CM | POA: Insufficient documentation

## 2017-01-16 DIAGNOSIS — L03116 Cellulitis of left lower limb: Secondary | ICD-10-CM

## 2017-01-16 DIAGNOSIS — E559 Vitamin D deficiency, unspecified: Secondary | ICD-10-CM | POA: Diagnosis not present

## 2017-01-16 DIAGNOSIS — Z951 Presence of aortocoronary bypass graft: Secondary | ICD-10-CM | POA: Diagnosis not present

## 2017-01-16 DIAGNOSIS — I10 Essential (primary) hypertension: Secondary | ICD-10-CM | POA: Insufficient documentation

## 2017-01-16 DIAGNOSIS — M79605 Pain in left leg: Secondary | ICD-10-CM | POA: Diagnosis not present

## 2017-01-16 DIAGNOSIS — Z87891 Personal history of nicotine dependence: Secondary | ICD-10-CM | POA: Insufficient documentation

## 2017-01-16 DIAGNOSIS — Z79899 Other long term (current) drug therapy: Secondary | ICD-10-CM

## 2017-01-16 DIAGNOSIS — E039 Hypothyroidism, unspecified: Secondary | ICD-10-CM | POA: Diagnosis not present

## 2017-01-16 DIAGNOSIS — Z7984 Long term (current) use of oral hypoglycemic drugs: Secondary | ICD-10-CM

## 2017-01-16 DIAGNOSIS — J449 Chronic obstructive pulmonary disease, unspecified: Secondary | ICD-10-CM

## 2017-01-16 DIAGNOSIS — I2510 Atherosclerotic heart disease of native coronary artery without angina pectoris: Secondary | ICD-10-CM

## 2017-01-16 DIAGNOSIS — M79662 Pain in left lower leg: Secondary | ICD-10-CM | POA: Diagnosis not present

## 2017-01-16 DIAGNOSIS — E785 Hyperlipidemia, unspecified: Secondary | ICD-10-CM | POA: Diagnosis not present

## 2017-01-16 NOTE — ED Triage Notes (Signed)
Pt c/o redness and swelling to left foot that started today

## 2017-01-17 LAB — CBC WITH DIFFERENTIAL/PLATELET
Basophils Absolute: 0 10*3/uL (ref 0.0–0.1)
Basophils Relative: 0 %
Eosinophils Absolute: 0.3 10*3/uL (ref 0.0–0.7)
Eosinophils Relative: 3 %
HCT: 38.2 % — ABNORMAL LOW (ref 39.0–52.0)
Hemoglobin: 12.8 g/dL — ABNORMAL LOW (ref 13.0–17.0)
Lymphocytes Relative: 19 %
Lymphs Abs: 1.9 10*3/uL (ref 0.7–4.0)
MCH: 30 pg (ref 26.0–34.0)
MCHC: 33.5 g/dL (ref 30.0–36.0)
MCV: 89.7 fL (ref 78.0–100.0)
Monocytes Absolute: 0.6 10*3/uL (ref 0.1–1.0)
Monocytes Relative: 6 %
Neutro Abs: 7.3 10*3/uL (ref 1.7–7.7)
Neutrophils Relative %: 72 %
Platelets: 170 10*3/uL (ref 150–400)
RBC: 4.26 MIL/uL (ref 4.22–5.81)
RDW: 13.2 % (ref 11.5–15.5)
WBC: 10.1 10*3/uL (ref 4.0–10.5)

## 2017-01-17 LAB — BASIC METABOLIC PANEL
Anion gap: 10 (ref 5–15)
BUN: 14 mg/dL (ref 6–20)
CO2: 27 mmol/L (ref 22–32)
Calcium: 9 mg/dL (ref 8.9–10.3)
Chloride: 104 mmol/L (ref 101–111)
Creatinine, Ser: 0.93 mg/dL (ref 0.61–1.24)
GFR calc Af Amer: >60 mL/min (ref >60)
GFR calc non Af Amer: >60 mL/min (ref >60)
Glucose, Bld: 207 mg/dL — ABNORMAL HIGH (ref 65–99)
Potassium: 3.8 mmol/L (ref 3.5–5.1)
Sodium: 141 mmol/L (ref 135–145)

## 2017-01-17 LAB — URIC ACID: Uric Acid, Serum: 8.9 mg/dL — ABNORMAL HIGH (ref 4.4–7.6)

## 2017-01-17 MED ORDER — INDOMETHACIN 25 MG PO CAPS: ORAL_CAPSULE | ORAL | 0 refills | Status: DC

## 2017-01-17 MED ORDER — DOXYCYCLINE HYCLATE 100 MG PO CAPS: 100.0000 mg | ORAL_CAPSULE | Freq: Two times a day (BID) | ORAL | 0 refills | Status: DC

## 2017-01-17 MED ORDER — DOXYCYCLINE HYCLATE 100 MG PO TABS
100.0000 mg | ORAL_TABLET | Freq: Once | ORAL | Status: AC
Start: 2017-01-17 — End: 2017-01-17
  Administered 2017-01-17: 100 mg via ORAL
  Filled 2017-01-17: qty 1

## 2017-01-17 MED ORDER — COLCHICINE 0.6 MG PO TABS
0.6000 mg | ORAL_TABLET | Freq: Once | ORAL | Status: AC
  Administered 2017-01-17: 0.6 mg via ORAL
  Filled 2017-01-17: qty 1

## 2017-01-17 MED ORDER — COLCHICINE 0.6 MG PO TABS: 0.6000 mg | ORAL_TABLET | Freq: Two times a day (BID) | ORAL | 0 refills | Status: DC

## 2017-01-17 MED ORDER — NAPROXEN 250 MG PO TABS
500.0000 mg | ORAL_TABLET | Freq: Once | ORAL | Status: AC
  Administered 2017-01-17: 500 mg via ORAL
  Filled 2017-01-17: qty 2

## 2017-01-17 NOTE — ED Provider Notes (Signed)
[facility] DEPT Provider Note   CSN: [id number] Arrival date & time: 01/16/17  2259  Time seen 12:26 AM   History   Chief Complaint Chief Complaint  Patient presents with  . Leg Swelling    HPI CUAHUTEMOC May is a 71 y.o. male.  HPI  Patient reports about dark this evening which would be around 9 PM he had acute onset of pain and redness in his left foot. He denies any known injury including insect sting or bite, stepping on something or twisting his ankle. He denies fever or chills. He states he's never had this before. He complains of a lot of pain in his foot. He is diabetic and states his CBGs have been normal. He denies any family history of gout.  PCP Sharion Balloon, FNP   Past Medical History:  Diagnosis Date  . CAD (coronary artery disease)    a. s/p NSTEMI 4/11 => s/p CABG (L-LAD, S-OM2, S-PDA/PL);  b.  ETT-Myoview 6/14:  normal study, no ischemia, EF 67%  . Carotid stenosis    Carotid U/S 1/24:  RICA 5-80%, LICA 99-83%; right vertebral flow retrograde suggestive of steel-consider PV consult  . COPD (chronic obstructive pulmonary disease) ([place])   . HLD (hyperlipidemia)   . HTN (hypertension)   . Hx of echocardiogram    Echo 6/14: Mod LVH, focal basal hypertrophy, EF 50-55%, mild AS (mean 17 mmHg) and mild AI, mild to mod LAE, mild RAE  . Leg edema   . Obesity   . Renal insufficiency   . Subclavian artery stenosis, right ([place])    based upon carotid U/S done 11/2012    Patient Active Problem List   Diagnosis Date Noted  . Bilateral carotid artery stenosis 11/01/2016  . Aortic valve stenosis 11/01/2016  . Obesity (BMI 30-39.9) 11/29/2015  . Trigeminy 05/23/2013  . Subclavian artery stenosis, right ([place]) 12/03/2012  . Diabetes mellitus, type 2 ([place]) 08/26/2012  . Vitamin D deficiency 08/26/2012  . Hypothyroidism 07/12/2011  . Hyperlipidemia 11/10/2009  . CORONARY ATHEROSCLEROSIS NATIVE CORONARY ARTERY 11/10/2009    Past Surgical History:  Procedure  Laterality Date  . CORONARY ARTERY BYPASS GRAFT     2011, LIMA to LAD coronary artery, SVG to OM2 branch of lect circumflex coronary artery, and a sequential SVG to psot descening to posterolateral branches to RCA  . endoscopic vein harvesting     right leg   . HERNIA REPAIR    . TONSILLECTOMY         Home Medications    Prior to Admission medications   Medication Sig Start Date End Date Taking? Authorizing Provider  albuterol (PROVENTIL HFA;VENTOLIN HFA) 108 (90 Base) MCG/ACT inhaler Inhale 2 puffs into the lungs every 6 (six) hours as needed for wheezing or shortness of breath. 02/23/16   Evelina Dun A, FNP  aspirin EC 81 MG tablet Take 81 mg by mouth daily.    [provider]  atorvastatin (LIPITOR) 40 MG tablet TAKE 1 TABLET BY MOUTH  DAILY 01/09/17   Evelina Dun A, FNP  colchicine 0.6 MG tablet Take 1 tablet (0.6 mg total) by mouth 2 (two) times daily. 01/17/17   Rolland Porter, MD  doxycycline (VIBRAMYCIN) 100 MG capsule Take 1 capsule (100 mg total) by mouth 2 (two) times daily. 01/17/17   Rolland Porter, MD  Elastic Bandages & Supports ([place] 20-30MMHG MEDIUM) MISC Wear daily to support circulation and prevent swelling 03/13/16   Claretta Fraise, MD  indomethacin (INDOCIN) 25 MG  capsule Take 1 po TID x 2 d then 1 po BID x 3d then 1 po QD x 3d. Take with food. 01/17/17   Rolland Porter, MD  levothyroxine (SYNTHROID, LEVOTHROID) 125 MCG tablet TAKE 1 TABLET BY MOUTH  DAILY 01/09/17   Evelina Dun A, FNP  metFORMIN (GLUCOPHAGE) 1000 MG tablet TAKE 1 TABLET BY MOUTH TWO  TIMES DAILY 01/09/17   Evelina Dun A, FNP  nitroGLYCERIN (NITROSTAT) 0.4 MG SL tablet Place 1 tablet (0.4 mg total) under the tongue every 5 (five) minutes as needed for chest pain. 11/01/16 11/23/17  Minus Breeding, MD  [facility] DELICA LANCETS 02H MISC Use one daily 09/13/15   Sharion Balloon, FNP  [facility] VERIO test strip TEST EVERY DAY 01/09/17   Evelina Dun A, FNP  potassium chloride SA (K-DUR,KLOR-CON) 20 MEQ  tablet TAKE 1 TABLET BY MOUTH  EVERY DAY 01/09/17   Minus Breeding, MD  torsemide (DEMADEX) 20 MG tablet TAKE 2 TABLETS BY MOUTH  DAILY 01/09/17   Evelina Dun A, FNP  valsartan (DIOVAN) 80 MG tablet TAKE 1 TABLET BY MOUTH  DAILY 01/09/17   Evelina Dun A, FNP  Vitamin D, Ergocalciferol, (DRISDOL) 50000 units CAPS capsule Take 1 capsule (50,000 Units total) by mouth once a week. 09/08/15   Sharion Balloon, FNP  Vitamin D, Ergocalciferol, (DRISDOL) 50000 units CAPS capsule TAKE 1 CAPSULE BY MOUTH ONCE A WEEK 01/15/17   Sharion Balloon, FNP    Family History Family History  Problem Relation Age of Onset  . Heart disease Father   . Heart attack Father   . Mental illness Mother   . Mental illness Sister   . Diabetes Brother   . Depression Maternal Aunt     Social History Social History  Substance Use Topics  . Smoking status: Former Smoker    Quit date: 07/11/2009  . Smokeless tobacco: Never Used     Comment: smoked about 10 cig/day; used to smoke 2 ppd for many years   . Alcohol use No  lives at home Lives with spouse   Allergies   Lisinopril   Review of Systems Review of Systems  All other systems reviewed and are negative.    Physical Exam Updated Vital Signs BP 128/78 (BP Location: Right Arm)   Pulse 77   Temp 97.8 F (36.6 C) (Oral)   Resp 18   Ht 5' 7.5" (1.715 m)   Wt 98.4 kg (217 lb)   SpO2 98%   BMI 33.49 kg/m   Vital signs normal    Physical Exam  Constitutional: He is oriented to person, place, and time. He appears well-developed and well-nourished.  Non-toxic appearance. He does not appear ill. No distress.  HENT:  Head: Normocephalic and atraumatic.  Right Ear: External ear normal.  Left Ear: External ear normal.  Nose: Nose normal. No mucosal edema or rhinorrhea.  Mouth/Throat: No dental abscesses or uvula swelling.  Eyes: Pupils are equal, round, and reactive to light. Conjunctivae and EOM are normal.  Neck: Normal range of motion and full  passive range of motion without pain. Neck supple.  Cardiovascular: Normal rate.   Pulmonary/Chest: Effort normal. No respiratory distress. He has no rhonchi. He exhibits no crepitus.  Abdominal: Normal appearance. There is no tenderness.  Musculoskeletal: Normal range of motion. He exhibits edema and tenderness.  Moves all extremities well.  Patient's noted to have diffuse reddening of his distal left foot with swelling. There is a gap between his ankle and his  lower leg and then there is another area of redness, it is well circumscribed and is questionably from a sock. His foot is tender to even light touch. When I inspect the skin there is no obvious insect sting bite areas.there are no breaks in the skin.  Neurological: He is alert and oriented to person, place, and time. He has normal strength. No cranial nerve deficit.  Skin: Skin is warm, dry and intact. No rash noted. No erythema. No pallor.  Psychiatric: He has a normal mood and affect. His speech is normal and behavior is normal. His mood appears not anxious.  Nursing note and vitals reviewed.         ED Treatments / Results  Labs (all labs ordered are listed, but only abnormal results are displayed) Results for orders placed or performed during the hospital encounter of 53/66/44  Basic metabolic panel  Result Value Ref Range   Sodium 141 135 - 145 mmol/L   Potassium 3.8 3.5 - 5.1 mmol/L   Chloride 104 101 - 111 mmol/L   CO2 27 22 - 32 mmol/L   Glucose, Bld 207 (H) 65 - 99 mg/dL   BUN 14 6 - 20 mg/dL   Creatinine, Ser 0.93 0.61 - 1.24 mg/dL   Calcium 9.0 8.9 - 10.3 mg/dL   GFR calc non Af Amer >60 >60 mL/min   GFR calc Af Amer >60 >60 mL/min   Anion gap 10 5 - 15  CBC with Differential  Result Value Ref Range   WBC 10.1 4.0 - 10.5 K/uL   RBC 4.26 4.22 - 5.81 MIL/uL   Hemoglobin 12.8 (L) 13.0 - 17.0 g/dL   HCT 38.2 (L) 39.0 - 52.0 %   MCV 89.7 78.0 - 100.0 fL   MCH 30.0 26.0 - 34.0 pg   MCHC 33.5 30.0 - 36.0 g/dL     RDW 13.2 11.5 - 15.5 %   Platelets 170 150 - 400 K/uL   Neutrophils Relative % 72 %   Neutro Abs 7.3 1.7 - 7.7 K/uL   Lymphocytes Relative 19 %   Lymphs Abs 1.9 0.7 - 4.0 K/uL   Monocytes Relative 6 %   Monocytes Absolute 0.6 0.1 - 1.0 K/uL   Eosinophils Relative 3 %   Eosinophils Absolute 0.3 0.0 - 0.7 K/uL   Basophils Relative 0 %   Basophils Absolute 0.0 0.0 - 0.1 K/uL  Uric acid  Result Value Ref Range   Uric Acid, Serum 8.9 (H) 4.4 - 7.6 mg/dL   Laboratory interpretation all normal except Mild anemia, elevated uric acid with normal total white blood cell count    EKG  EKG Interpretation None       Radiology No results found.  Procedures Procedures (including critical care time)  Medications Ordered in ED Medications  doxycycline (VIBRA-TABS) tablet 100 mg (100 mg Oral Given 01/17/17 0037)  colchicine tablet 0.6 mg (0.6 mg Oral Given 01/17/17 0037)  naproxen (NAPROSYN) tablet 500 mg (500 mg Oral Given 01/17/17 0037)     Initial Impression / Assessment and Plan / ED Course  I have reviewed the triage vital signs and the nursing notes.  Pertinent labs & imaging results that were available during my care of the patient were reviewed by me and considered in my medical decision making (see chart for details).     Patient was extremely painful just a minimal touch, he was given cultures seen in the proximal and for pain for possible acute gouty arthritis episode and  also doxycycline in case it was more of a cellulitic problem. Patient's BUN and creatinine from October were normal.  Patient was sleeping at time of discharge. He states his pain is much improved. The redness is almost gone. We discussed his test results and need to follow-up.the etiology of his gout is not clear, he does not drink alcohol, he has no family history, he is not on a dyazide diuretic.  Final Clinical Impressions(s) / ED Diagnoses   Final diagnoses:  Acute gout of left foot, unspecified  cause  Cellulitis of right foot    New Prescriptions New Prescriptions   COLCHICINE 0.6 MG TABLET    Take 1 tablet (0.6 mg total) by mouth 2 (two) times daily.   DOXYCYCLINE (VIBRAMYCIN) 100 MG CAPSULE    Take 1 capsule (100 mg total) by mouth 2 (two) times daily.   INDOMETHACIN (INDOCIN) 25 MG CAPSULE    Take 1 po TID x 2 d then 1 po BID x 3d then 1 po QD x 3d. Take with food.   Plan discharge  Rolland Porter, MD, Barbette Or, MD 01/17/17 [phone]

## 2017-01-17 NOTE — Discharge Instructions (Signed)
Elevate your foot. Use ice and heat for comfort. Take the medications as prescribed. Please call your doctor's office to let them know you had an attack of gout.  Return to the ED if you get a fever, or the pain, swelling or redness gets worse.

## 2017-01-18 ENCOUNTER — Emergency Department (HOSPITAL_COMMUNITY): Payer: Medicare Other

## 2017-01-18 ENCOUNTER — Inpatient Hospital Stay (HOSPITAL_COMMUNITY)
Admission: EM | Admit: 2017-01-18 | Discharge: 2017-01-20 | DRG: 603 | Disposition: A | Discharge status: Home / Self Care | Payer: Medicare Other | Attending: Internal Medicine | Admitting: Internal Medicine

## 2017-01-18 ENCOUNTER — Encounter (HOSPITAL_COMMUNITY): Payer: Self-pay | Admitting: Emergency Medicine

## 2017-01-18 ENCOUNTER — Ambulatory Visit: Payer: Medicare Other | Admitting: Nurse Practitioner

## 2017-01-18 DIAGNOSIS — L02416 Cutaneous abscess of left lower limb: Secondary | ICD-10-CM | POA: Diagnosis present

## 2017-01-18 DIAGNOSIS — Z888 Allergy status to other drugs, medicaments and biological substances status: Secondary | ICD-10-CM | POA: Diagnosis not present

## 2017-01-18 DIAGNOSIS — M79662 Pain in left lower leg: Secondary | ICD-10-CM | POA: Diagnosis not present

## 2017-01-18 DIAGNOSIS — L03116 Cellulitis of left lower limb: Secondary | ICD-10-CM | POA: Diagnosis not present

## 2017-01-18 DIAGNOSIS — E785 Hyperlipidemia, unspecified: Secondary | ICD-10-CM | POA: Diagnosis present

## 2017-01-18 DIAGNOSIS — L02419 Cutaneous abscess of limb, unspecified: Secondary | ICD-10-CM

## 2017-01-18 DIAGNOSIS — Z951 Presence of aortocoronary bypass graft: Secondary | ICD-10-CM

## 2017-01-18 DIAGNOSIS — E038 Other specified hypothyroidism: Secondary | ICD-10-CM

## 2017-01-18 DIAGNOSIS — I2510 Atherosclerotic heart disease of native coronary artery without angina pectoris: Secondary | ICD-10-CM | POA: Diagnosis present

## 2017-01-18 DIAGNOSIS — I10 Essential (primary) hypertension: Secondary | ICD-10-CM | POA: Diagnosis present

## 2017-01-18 DIAGNOSIS — Z6833 Body mass index (BMI) 33.0-33.9, adult: Secondary | ICD-10-CM | POA: Diagnosis not present

## 2017-01-18 DIAGNOSIS — E559 Vitamin D deficiency, unspecified: Secondary | ICD-10-CM | POA: Diagnosis present

## 2017-01-18 DIAGNOSIS — Z87891 Personal history of nicotine dependence: Secondary | ICD-10-CM | POA: Diagnosis not present

## 2017-01-18 DIAGNOSIS — Z7984 Long term (current) use of oral hypoglycemic drugs: Secondary | ICD-10-CM | POA: Diagnosis not present

## 2017-01-18 DIAGNOSIS — Z7982 Long term (current) use of aspirin: Secondary | ICD-10-CM | POA: Diagnosis not present

## 2017-01-18 DIAGNOSIS — I350 Nonrheumatic aortic (valve) stenosis: Secondary | ICD-10-CM

## 2017-01-18 DIAGNOSIS — E119 Type 2 diabetes mellitus without complications: Secondary | ICD-10-CM | POA: Diagnosis present

## 2017-01-18 DIAGNOSIS — E1169 Type 2 diabetes mellitus with other specified complication: Secondary | ICD-10-CM | POA: Diagnosis present

## 2017-01-18 DIAGNOSIS — J449 Chronic obstructive pulmonary disease, unspecified: Secondary | ICD-10-CM | POA: Diagnosis present

## 2017-01-18 DIAGNOSIS — M79605 Pain in left leg: Secondary | ICD-10-CM | POA: Diagnosis present

## 2017-01-18 DIAGNOSIS — E784 Other hyperlipidemia: Secondary | ICD-10-CM | POA: Diagnosis not present

## 2017-01-18 DIAGNOSIS — L03119 Cellulitis of unspecified part of limb: Secondary | ICD-10-CM | POA: Diagnosis not present

## 2017-01-18 DIAGNOSIS — E669 Obesity, unspecified: Secondary | ICD-10-CM

## 2017-01-18 DIAGNOSIS — E039 Hypothyroidism, unspecified: Secondary | ICD-10-CM | POA: Diagnosis present

## 2017-01-18 LAB — CBC WITH DIFFERENTIAL/PLATELET
Basophils Absolute: 0 10*3/uL (ref 0.0–0.1)
Basophils Relative: 0 %
Eosinophils Absolute: 0.1 10*3/uL (ref 0.0–0.7)
Eosinophils Relative: 1 %
HCT: 38 % — ABNORMAL LOW (ref 39.0–52.0)
Hemoglobin: 12.7 g/dL — ABNORMAL LOW (ref 13.0–17.0)
Lymphocytes Relative: 12 %
Lymphs Abs: 1.4 10*3/uL (ref 0.7–4.0)
MCH: 29.5 pg (ref 26.0–34.0)
MCHC: 33.4 g/dL (ref 30.0–36.0)
MCV: 88.2 fL (ref 78.0–100.0)
Monocytes Absolute: 0.7 10*3/uL (ref 0.1–1.0)
Monocytes Relative: 6 %
Neutro Abs: 9.8 10*3/uL — ABNORMAL HIGH (ref 1.7–7.7)
Neutrophils Relative %: 81 %
Platelets: 149 10*3/uL — ABNORMAL LOW (ref 150–400)
RBC: 4.31 MIL/uL (ref 4.22–5.81)
RDW: 13 % (ref 11.5–15.5)
WBC: 12.1 10*3/uL — ABNORMAL HIGH (ref 4.0–10.5)

## 2017-01-18 LAB — BASIC METABOLIC PANEL
Anion gap: 10 (ref 5–15)
BUN: 16 mg/dL (ref 6–20)
CO2: 26 mmol/L (ref 22–32)
Calcium: 9 mg/dL (ref 8.9–10.3)
Chloride: 100 mmol/L — ABNORMAL LOW (ref 101–111)
Creatinine, Ser: 1.05 mg/dL (ref 0.61–1.24)
GFR calc Af Amer: >60 mL/min (ref >60)
GFR calc non Af Amer: >60 mL/min (ref >60)
Glucose, Bld: 179 mg/dL — ABNORMAL HIGH (ref 65–99)
Potassium: 3.8 mmol/L (ref 3.5–5.1)
Sodium: 136 mmol/L (ref 135–145)

## 2017-01-18 LAB — TROPONIN I: Troponin I: <0.03 ng/mL (ref <0.03)

## 2017-01-18 LAB — CK: Total CK: 83 U/L (ref 49–397)

## 2017-01-18 MED ORDER — ONDANSETRON HCL 4 MG PO TABS: 4.0000 mg | ORAL_TABLET | Freq: Four times a day (QID) | ORAL | Status: DC | PRN

## 2017-01-18 MED ORDER — CEFAZOLIN SODIUM-DEXTROSE 1-4 GM/50ML-% IV SOLN
INTRAVENOUS | Status: AC
  Filled 2017-01-18: qty 100

## 2017-01-18 MED ORDER — ENOXAPARIN SODIUM 40 MG/0.4ML ~~LOC~~ SOLN
40.0000 mg | SUBCUTANEOUS | Status: DC
  Administered 2017-01-18 – 2017-01-19 (×2): 40 mg via SUBCUTANEOUS
  Filled 2017-01-18 (×2): qty 0.4

## 2017-01-18 MED ORDER — LEVOTHYROXINE SODIUM 50 MCG PO TABS
125.0000 ug | ORAL_TABLET | Freq: Every day | ORAL | Status: DC
  Administered 2017-01-19 – 2017-01-20 (×2): 125 ug via ORAL
  Filled 2017-01-18 (×2): qty 3

## 2017-01-18 MED ORDER — ASPIRIN EC 81 MG PO TBEC
81.0000 mg | DELAYED_RELEASE_TABLET | Freq: Every day | ORAL | Status: DC
  Administered 2017-01-19 – 2017-01-20 (×2): 81 mg via ORAL
  Filled 2017-01-18 (×2): qty 1

## 2017-01-18 MED ORDER — ONDANSETRON HCL 4 MG/2ML IJ SOLN: 4.0000 mg | Freq: Four times a day (QID) | INTRAMUSCULAR | Status: DC | PRN

## 2017-01-18 MED ORDER — TORSEMIDE 20 MG PO TABS
40.0000 mg | ORAL_TABLET | Freq: Every day | ORAL | Status: DC
  Administered 2017-01-19 – 2017-01-20 (×2): 40 mg via ORAL
  Filled 2017-01-18 (×2): qty 2

## 2017-01-18 MED ORDER — POTASSIUM CHLORIDE CRYS ER 20 MEQ PO TBCR
20.0000 meq | EXTENDED_RELEASE_TABLET | Freq: Every day | ORAL | Status: DC
  Administered 2017-01-19 – 2017-01-20 (×2): 20 meq via ORAL
  Filled 2017-01-18 (×2): qty 1

## 2017-01-18 MED ORDER — CEFAZOLIN SODIUM-DEXTROSE 2-4 GM/100ML-% IV SOLN
2.0000 g | Freq: Three times a day (TID) | INTRAVENOUS | Status: DC
  Administered 2017-01-18 – 2017-01-20 (×6): 2 g via INTRAVENOUS
  Filled 2017-01-18 (×13): qty 100

## 2017-01-18 MED ORDER — IRBESARTAN 75 MG PO TABS
75.0000 mg | ORAL_TABLET | Freq: Every day | ORAL | Status: DC
  Administered 2017-01-19 – 2017-01-20 (×2): 75 mg via ORAL
  Filled 2017-01-18 (×2): qty 1

## 2017-01-18 MED ORDER — ACETAMINOPHEN 325 MG PO TABS: 650.0000 mg | ORAL_TABLET | Freq: Four times a day (QID) | ORAL | Status: DC | PRN

## 2017-01-18 MED ORDER — ATORVASTATIN CALCIUM 40 MG PO TABS
40.0000 mg | ORAL_TABLET | Freq: Every day | ORAL | Status: DC
  Administered 2017-01-19: 40 mg via ORAL
  Filled 2017-01-18: qty 1

## 2017-01-18 MED ORDER — HYDROCODONE-ACETAMINOPHEN 5-325 MG PO TABS: 1.0000 | ORAL_TABLET | ORAL | Status: DC | PRN

## 2017-01-18 MED ORDER — VITAMIN D (ERGOCALCIFEROL) 1.25 MG (50000 UNIT) PO CAPS
50000.0000 [IU] | ORAL_CAPSULE | ORAL | Status: DC
  Filled 2017-01-18 (×2): qty 1

## 2017-01-18 MED ORDER — ACETAMINOPHEN 650 MG RE SUPP: 650.0000 mg | Freq: Four times a day (QID) | RECTAL | Status: DC | PRN

## 2017-01-18 NOTE — ED Provider Notes (Signed)
[facility] DEPT Provider Note   CSN: [id number] Arrival date & time: 01/18/17  1115     History   Chief Complaint Chief Complaint  Patient presents with  . Gout    left leg    HPI Matthew May is a 71 y.o. male.  Patient is a 71 year old male with past medical history of CAD with CABG, COPD, hypertension, chronic renal insufficiency. He presents today for evaluation of left leg pain, redness, and swelling that has worsened over the past several days. He was seen here 2 days ago and diagnosed with possible gout versus cellulitis. He was started on colchicine, Indocin, and doxycycline, however the redness, swelling, and pain has worsened. He denies any fevers or chills. He denies any chest pain or shortness of breath.   The history is provided by the patient.    Past Medical History:  Diagnosis Date  . CAD (coronary artery disease)    a. s/p NSTEMI 4/11 => s/p CABG (L-LAD, S-OM2, S-PDA/PL);  b.  ETT-Myoview 6/14:  normal study, no ischemia, EF 67%  . Carotid stenosis    Carotid U/S 4/78:  RICA 2-95%, LICA 62-13%; right vertebral flow retrograde suggestive of steel-consider PV consult  . COPD (chronic obstructive pulmonary disease) ([place])   . HLD (hyperlipidemia)   . HTN (hypertension)   . Hx of echocardiogram    Echo 6/14: Mod LVH, focal basal hypertrophy, EF 50-55%, mild AS (mean 17 mmHg) and mild AI, mild to mod LAE, mild RAE  . Leg edema   . Obesity   . Renal insufficiency   . Subclavian artery stenosis, right ([place])    based upon carotid U/S done 11/2012    Patient Active Problem List   Diagnosis Date Noted  . Bilateral carotid artery stenosis 11/01/2016  . Aortic valve stenosis 11/01/2016  . Obesity (BMI 30-39.9) 11/29/2015  . Trigeminy 05/23/2013  . Subclavian artery stenosis, right ([place]) 12/03/2012  . Diabetes mellitus, type 2 ([place]) 08/26/2012  . Vitamin D deficiency 08/26/2012  . Hypothyroidism 07/12/2011  . Hyperlipidemia 11/10/2009  . CORONARY  ATHEROSCLEROSIS NATIVE CORONARY ARTERY 11/10/2009    Past Surgical History:  Procedure Laterality Date  . CORONARY ARTERY BYPASS GRAFT     2011, LIMA to LAD coronary artery, SVG to OM2 branch of lect circumflex coronary artery, and a sequential SVG to psot descening to posterolateral branches to RCA  . endoscopic vein harvesting     right leg   . HERNIA REPAIR    . TONSILLECTOMY         Home Medications    Prior to Admission medications   Medication Sig Start Date End Date Taking? Authorizing Provider  albuterol (PROVENTIL HFA;VENTOLIN HFA) 108 (90 Base) MCG/ACT inhaler Inhale 2 puffs into the lungs every 6 (six) hours as needed for wheezing or shortness of breath. 02/23/16   Evelina Dun A, FNP  aspirin EC 81 MG tablet Take 81 mg by mouth daily.    [provider]  atorvastatin (LIPITOR) 40 MG tablet TAKE 1 TABLET BY MOUTH  DAILY 01/09/17   Evelina Dun A, FNP  colchicine 0.6 MG tablet Take 1 tablet (0.6 mg total) by mouth 2 (two) times daily. 01/17/17   Rolland Porter, MD  doxycycline (VIBRAMYCIN) 100 MG capsule Take 1 capsule (100 mg total) by mouth 2 (two) times daily. 01/17/17   Rolland Porter, MD  Elastic Bandages & Supports ([place] 20-30MMHG MEDIUM) MISC Wear daily to support circulation and prevent swelling 03/13/16   Claretta Fraise,  MD  indomethacin (INDOCIN) 25 MG capsule Take 1 po TID x 2 d then 1 po BID x 3d then 1 po QD x 3d. Take with food. 01/17/17   Rolland Porter, MD  levothyroxine (SYNTHROID, LEVOTHROID) 125 MCG tablet TAKE 1 TABLET BY MOUTH  DAILY 01/09/17   Evelina Dun A, FNP  metFORMIN (GLUCOPHAGE) 1000 MG tablet TAKE 1 TABLET BY MOUTH TWO  TIMES DAILY 01/09/17   Evelina Dun A, FNP  nitroGLYCERIN (NITROSTAT) 0.4 MG SL tablet Place 1 tablet (0.4 mg total) under the tongue every 5 (five) minutes as needed for chest pain. 11/01/16 11/23/17  Minus Breeding, MD  [facility] DELICA LANCETS 26J MISC Use one daily 09/13/15   Sharion Balloon, FNP  [facility] VERIO test strip  TEST EVERY DAY 01/09/17   Evelina Dun A, FNP  potassium chloride SA (K-DUR,KLOR-CON) 20 MEQ tablet TAKE 1 TABLET BY MOUTH  EVERY DAY 01/09/17   Minus Breeding, MD  torsemide (DEMADEX) 20 MG tablet TAKE 2 TABLETS BY MOUTH  DAILY 01/09/17   Evelina Dun A, FNP  valsartan (DIOVAN) 80 MG tablet TAKE 1 TABLET BY MOUTH  DAILY 01/09/17   Evelina Dun A, FNP  Vitamin D, Ergocalciferol, (DRISDOL) 50000 units CAPS capsule Take 1 capsule (50,000 Units total) by mouth once a week. 09/08/15   Sharion Balloon, FNP  Vitamin D, Ergocalciferol, (DRISDOL) 50000 units CAPS capsule TAKE 1 CAPSULE BY MOUTH ONCE A WEEK 01/15/17   Sharion Balloon, FNP    Family History Family History  Problem Relation Age of Onset  . Heart disease Father   . Heart attack Father   . Mental illness Mother   . Mental illness Sister   . Diabetes Brother   . Depression Maternal Aunt     Social History Social History  Substance Use Topics  . Smoking status: Former Smoker    Quit date: 07/11/2009  . Smokeless tobacco: Never Used     Comment: smoked about 10 cig/day; used to smoke 2 ppd for many years   . Alcohol use No     Allergies   Lisinopril   Review of Systems Review of Systems  All other systems reviewed and are negative.    Physical Exam Updated Vital Signs BP 135/70 (BP Location: Left Arm)   Temp 97.9 F (36.6 C) (Oral)   Resp 16   Ht 5\' 7"  (1.702 m)   Wt 98.4 kg (217 lb)   SpO2 98%   BMI 33.99 kg/m   Physical Exam  Constitutional: He is oriented to person, place, and time. He appears well-developed and well-nourished. No distress.  HENT:  Head: Normocephalic and atraumatic.  Mouth/Throat: Oropharynx is clear and moist.  Neck: Normal range of motion. Neck supple.  Cardiovascular: Normal rate and regular rhythm.  Exam reveals no friction rub.   No murmur heard. Pulmonary/Chest: Effort normal and breath sounds normal. No respiratory distress. He has no wheezes. He has no rales.  Abdominal: Soft.  Bowel sounds are normal. He exhibits no distension. There is no tenderness.  Musculoskeletal: Normal range of motion. He exhibits edema.  There is redness, warmth, swelling, and tenderness of the left lower leg in a stocking distribution. DP pulses are palpable.  Neurological: He is alert and oriented to person, place, and time. Coordination normal.  Skin: Skin is warm and dry. He is not diaphoretic.  Nursing note and vitals reviewed.    ED Treatments / Results  Labs (all labs ordered are listed, but only abnormal  results are displayed) Labs Reviewed  BASIC METABOLIC PANEL  CBC WITH DIFFERENTIAL/PLATELET    EKG  EKG Interpretation None       Radiology No results found.  Procedures Procedures (including critical care time)  Medications Ordered in ED Medications - No data to display   Initial Impression / Assessment and Plan / ED Course  I have reviewed the triage vital signs and the nursing notes.  Pertinent labs & imaging results that were available during my care of the patient were reviewed by me and considered in my medical decision making (see chart for details).  Patient returns to the ER for recheck 2 days after being diagnosed with possible cellulitis versus gout of the left ankle. The redness, warmth, and pain or spreading up the leg despite 2 days of doxycycline. DVT study today is negative. As he is worsening despite outpatient therapy, I feel as though he will require admission for repeat doses of IV antibiotics. He was given IV vancomycin. I've spoken with Dr. Carles Collet who will evaluate and determine the final disposition.  Final Clinical Impressions(s) / ED Diagnoses   Final diagnoses:  None    New Prescriptions New Prescriptions   No medications on file     Veryl Speak, MD 01/18/17 1536

## 2017-01-18 NOTE — ED Triage Notes (Signed)
Here In [facility] on 08/14 for left leg pain.  Instructed to come back to ED if not any better.  Left leg with redness knee to feet with edema noted.  Rates pain 7/10.

## 2017-01-18 NOTE — H&P (Signed)
History and Physical  Matthew May OQH:[id number] DOB: [birth year] DOA: 01/18/2017   PCP: Sharion Balloon, FNP   Patient coming from: Home  Chief Complaint: left leg pain  HPI:  Matthew May is a 71 y.o. male with medical history of coronary artery disease status post CABG, hypertension, hyperlipidemia, aortic stenosis, hypothyroidism presented with 3-4 day history of left leg pain. The patient denies any recent injury, trauma, or insect bites. The patient presented to the emergency department on the morning of 01/17/2017. He was discharged home with doxycycline, indomethacin, and colchicine. Since that period of time, he states that the erythema in his left leg has progressed proximally, and it has been more swollen. He states that the pain is about the same as it was on 01/17/2017. He denies any fevers, chills, shortness breath, nausea, vomiting, diarrhea, abdominal pain. He states that he had "chest stuffiness" this morning while sitting at the kitchen table. He states that it lasted only a few minutes and it has resolved. There are no associated symptoms or radiation to his arm or neck. He endorses compliance with the medications that were provided to him on 01/17/2017. Because of worsening redness, and swelling, the patient presented back to the emergency department on 01/18/2017. In the emergency department, the patient was afebrile and hemodynamically stable centering on 90% on room air. WBC was 12.1.  BMP was unremarkable. Left lower extremity ultrasound was negative for DVT. Because of failure to respond to oral antibiotics, the patient was admitted for intravenous antibiotics for left lower extremity cellulitis.   Assessment/Plan: Left lower extremity cellulitis -The patient failed oral doxycycline as outpatient -Start cefazolin -Check CPK -WBC increased 10.1-->12.1  Atypical chest discomfort /Coronary artery disease -Cycle troponins  -11/06/2000 echo EF 55-60%, no WMA,  grade 1 daily, severe AS -Continue aspirin and Lipitor  Severe aortic stenosis  -Patient follows Dr. Percival Spanish -He has been essentially asymptomatic   Diabetes mellitus type 2 -Holding metformin -12/19/2016 hemoglobin A1c 7.0 -NovoLog sliding scale  Essential hypertension -Continue ARB  Hypothyroidism -Continue levothyroxine  Hyperlipidemia -Continue statin  Obesity (BMI 30-39.9)    Past Medical History:  Diagnosis Date  . CAD (coronary artery disease)    a. s/p NSTEMI 4/11 => s/p CABG (L-LAD, S-OM2, S-PDA/PL);  b.  ETT-Myoview 6/14:  normal study, no ischemia, EF 67%  . Carotid stenosis    Carotid U/S 5/46:  RICA 5-68%, LICA 12-75%; right vertebral flow retrograde suggestive of steel-consider PV consult  . COPD (chronic obstructive pulmonary disease) ([place])   . HLD (hyperlipidemia)   . HTN (hypertension)   . Hx of echocardiogram    Echo 6/14: Mod LVH, focal basal hypertrophy, EF 50-55%, mild AS (mean 17 mmHg) and mild AI, mild to mod LAE, mild RAE  . Leg edema   . Obesity   . Renal insufficiency   . Subclavian artery stenosis, right ([place])    based upon carotid U/S done 11/2012   Past Surgical History:  Procedure Laterality Date  . CORONARY ARTERY BYPASS GRAFT     2011, LIMA to LAD coronary artery, SVG to OM2 branch of lect circumflex coronary artery, and a sequential SVG to psot descening to posterolateral branches to RCA  . endoscopic vein harvesting     right leg   . HERNIA REPAIR    . TONSILLECTOMY     Social History:  reports that he quit smoking about 7 years ago. He has never used smokeless tobacco. He reports that  he does not drink alcohol or use drugs.   Family History  Problem Relation Age of Onset  . Heart disease Father   . Heart attack Father   . Mental illness Mother   . Mental illness Sister   . Diabetes Brother   . Depression Maternal Aunt      Allergies  Allergen Reactions  . Lisinopril Cough     Prior to Admission medications     Medication Sig Start Date End Date Taking? Authorizing Provider  albuterol (PROVENTIL HFA;VENTOLIN HFA) 108 (90 Base) MCG/ACT inhaler Inhale 2 puffs into the lungs every 6 (six) hours as needed for wheezing or shortness of breath. 02/23/16  Yes [name], Christy A, FNP  aspirin EC 81 MG tablet Take 81 mg by mouth daily.   Yes [provider]  atorvastatin (LIPITOR) 40 MG tablet TAKE 1 TABLET BY MOUTH  DAILY 01/09/17  Yes [name], Christy A, FNP  colchicine 0.6 MG tablet Take 1 tablet (0.6 mg total) by mouth 2 (two) times daily. 01/17/17  Yes Rolland Porter, MD  doxycycline (VIBRAMYCIN) 100 MG capsule Take 1 capsule (100 mg total) by mouth 2 (two) times daily. 01/17/17  Yes Rolland Porter, MD  indomethacin (INDOCIN) 25 MG capsule Take 1 po TID x 2 d then 1 po BID x 3d then 1 po QD x 3d. Take with food. 01/17/17  Yes Rolland Porter, MD  levothyroxine (SYNTHROID, LEVOTHROID) 125 MCG tablet TAKE 1 TABLET BY MOUTH  DAILY 01/09/17  Yes Evelina Dun A, FNP  metFORMIN (GLUCOPHAGE) 1000 MG tablet TAKE 1 TABLET BY MOUTH TWO  TIMES DAILY 01/09/17  Yes [name], Christy A, FNP  nitroGLYCERIN (NITROSTAT) 0.4 MG SL tablet Place 1 tablet (0.4 mg total) under the tongue every 5 (five) minutes as needed for chest pain. 11/01/16 11/23/17 Yes Minus Breeding, MD  potassium chloride SA (K-DUR,KLOR-CON) 20 MEQ tablet TAKE 1 TABLET BY MOUTH  EVERY DAY 01/09/17  Yes Minus Breeding, MD  torsemide (DEMADEX) 20 MG tablet TAKE 2 TABLETS BY MOUTH  DAILY 01/09/17  Yes Evelina Dun A, FNP  valsartan (DIOVAN) 80 MG tablet TAKE 1 TABLET BY MOUTH  DAILY 01/09/17  Yes [name], Christy A, FNP  Vitamin D, Ergocalciferol, (DRISDOL) 50000 units CAPS capsule Take 1 capsule (50,000 Units total) by mouth once a week. 09/08/15  Yes [name], Christy A, FNP  Vitamin D, Ergocalciferol, (DRISDOL) 50000 units CAPS capsule TAKE 1 CAPSULE BY MOUTH ONCE A WEEK 01/15/17   Evelina Dun A, FNP    Review of Systems:  Constitutional:  No weight loss, night sweats, Fevers,  chills, fatigue.  Head&Eyes: No headache.  No vision loss.  No eye pain or scotoma ENT:  No Difficulty swallowing,Tooth/dental problems,Sore throat,  No ear ache, post nasal drip,  Cardio-vascular:  No chest pain, Orthopnea, PND, swelling in lower extremities,  dizziness, palpitations  GI:  No  abdominal pain, nausea, vomiting, diarrhea, loss of appetite, hematochezia, melena, heartburn, indigestion, Resp:  No shortness of breath with exertion or at rest. No cough. No coughing up of blood .No wheezing.No chest wall deformity  Skin:  no rash or lesions.  GU:  no dysuria, change in color of urine, no urgency or frequency. No flank pain.  Musculoskeletal:  C/o left leg pain.. No decreased range of motion. No back pain.  Psych:  No change in mood or affect. No depression or anxiety. Neurologic: No headache, no dysesthesia, no focal weakness, no vision loss. No syncope  Physical Exam: Vitals:   01/18/17  1124 01/18/17 1527 01/18/17 1528 01/18/17 1530  BP:  (!) 117/92  128/79  Pulse:   78 80  Resp:      Temp:      TempSrc:      SpO2:   95% 96%  Weight: 98.4 kg (217 lb)     Height: 5\' 7"  (1.702 m)      General:  A&O x 3, NAD, nontoxic, pleasant/cooperative Head/Eye: No conjunctival hemorrhage, no icterus, /AT, No nystagmus ENT:  No icterus,  No thrush, good dentition, no pharyngeal exudate Neck:  No masses, no lymphadenpathy, no bruits CV:  RRR, no rub, no gallop, no S3 Lung:  CTAB, good air movement, no wheeze, no rhonchi Abdomen: soft/NT, +BS, nondistended, no peritoneal signs Ext: No cyanosis, No rashes, No petechiae, No lymphangitis, 1 + LLE edema--see picture of leg below Neuro: CNII-XII intact, strength 4/5 in bilateral upper and lower extremities, no dysmetria        Labs on Admission:  Basic Metabolic Panel:  Recent Labs Lab 01/17/17 0036 01/18/17 1345  NA 141 136  K 3.8 3.8  CL 104 100*  CO2 27 26  GLUCOSE 207* 179*  BUN 14 16  CREATININE 0.93 1.05    CALCIUM 9.0 9.0   Liver Function Tests: No results for input(s): AST, ALT, ALKPHOS, BILITOT, PROT, ALBUMIN in the last 168 hours. No results for input(s): LIPASE, AMYLASE in the last 168 hours. No results for input(s): AMMONIA in the last 168 hours. CBC:  Recent Labs Lab 01/17/17 0036 01/18/17 1345  WBC 10.1 12.1*  NEUTROABS 7.3 9.8*  HGB 12.8* 12.7*  HCT 38.2* 38.0*  MCV 89.7 88.2  PLT 170 149*   Coagulation Profile: No results for input(s): INR, PROTIME in the last 168 hours. Cardiac Enzymes: No results for input(s): CKTOTAL, CKMB, CKMBINDEX, TROPONINI in the last 168 hours. BNP: Invalid input(s): POCBNP CBG: No results for input(s): GLUCAP in the last 168 hours. Urine analysis:    Component Value Date/Time   COLORURINE YELLOW 09/30/2009 2108   APPEARANCEUR Clear 09/13/2016 0849   LABSPEC 1.017 09/30/2009 2108   PHURINE 6.0 09/30/2009 2108   GLUCOSEU Negative 09/13/2016 0849   HGBUR NEGATIVE 09/30/2009 2108   BILIRUBINUR Negative 09/13/2016 0849   KETONESUR NEGATIVE 09/30/2009 2108   PROTEINUR Negative 09/13/2016 0849   PROTEINUR NEGATIVE 09/30/2009 2108   UROBILINOGEN 1.0 09/30/2009 2108   NITRITE Negative 09/13/2016 0849   NITRITE NEGATIVE 09/30/2009 2108   LEUKOCYTESUR Negative 09/13/2016 0849   Sepsis Labs: @LABRCNTIP (procalcitonin:4,lacticidven:4) )No results found for this or any previous visit (from the past 240 hour(s)).   Radiological Exams on Admission: US Venous Img Lower Unilateral Left  Result Date: 01/18/2017 CLINICAL DATA:  Left lower extremity pain and swelling for 2 days. EXAM: Left LOWER EXTREMITY VENOUS DOPPLER ULTRASOUND TECHNIQUE: Gray-scale sonography with graded compression, as well as color Doppler and duplex ultrasound were performed to evaluate the lower extremity deep venous systems from the level of the common femoral vein and including the common femoral, femoral, profunda femoral, popliteal and calf veins including the posterior  tibial, peroneal and gastrocnemius veins when visible. The superficial great saphenous vein was also interrogated. Spectral Doppler was utilized to evaluate flow at rest and with distal augmentation maneuvers in the common femoral, femoral and popliteal veins. COMPARISON:  Ultrasound of July 03, 2012. FINDINGS: Contralateral Common Femoral Vein: Respiratory phasicity is normal and symmetric with the symptomatic side. No evidence of thrombus. Normal compressibility. Common Femoral Vein: No evidence of thrombus. Normal compressibility,  respiratory phasicity and response to augmentation. Saphenofemoral Junction: No evidence of thrombus. Normal compressibility and flow on color Doppler imaging. Profunda Femoral Vein: No evidence of thrombus. Normal compressibility and flow on color Doppler imaging. Femoral Vein: No evidence of thrombus. Normal compressibility, respiratory phasicity and response to augmentation. Popliteal Vein: No evidence of thrombus. Normal compressibility, respiratory phasicity and response to augmentation. Calf Veins: No evidence of thrombus. Normal compressibility and flow on color Doppler imaging. Superficial Great Saphenous Vein: No evidence of thrombus. Normal compressibility and flow on color Doppler imaging. Venous Reflux:  None. Other Findings:  None. IMPRESSION: No evidence of DVT within the left lower extremity. Electronically Signed   By: Marijo Conception, M.D.   On: 01/18/2017 13:57    EKG: Independently reviewed. pending    Time spent:60 minutes Code Status:   FULL Family Communication:  Spouse updated at bedside Disposition Plan: expect 2-3 day hospitalization Consults called: none DVT Prophylaxis: [place] Lovenox  Amarien Carne, DO  [facility] Pager [phone]  If 7PM-7AM, please contact night-coverage [email or web address] Password [facility] 01/18/2017, 3:49 PM

## 2017-01-19 DIAGNOSIS — L03116 Cellulitis of left lower limb: Secondary | ICD-10-CM

## 2017-01-19 DIAGNOSIS — E784 Other hyperlipidemia: Secondary | ICD-10-CM

## 2017-01-19 LAB — CBC
HCT: 37.5 % — ABNORMAL LOW (ref 39.0–52.0)
Hemoglobin: 12.6 g/dL — ABNORMAL LOW (ref 13.0–17.0)
MCH: 29.9 pg (ref 26.0–34.0)
MCHC: 33.6 g/dL (ref 30.0–36.0)
MCV: 89.1 fL (ref 78.0–100.0)
Platelets: 165 10*3/uL (ref 150–400)
RBC: 4.21 MIL/uL — ABNORMAL LOW (ref 4.22–5.81)
RDW: 13.2 % (ref 11.5–15.5)
WBC: 9.4 10*3/uL (ref 4.0–10.5)

## 2017-01-19 LAB — BASIC METABOLIC PANEL
Anion gap: 8 (ref 5–15)
BUN: 15 mg/dL (ref 6–20)
CO2: 26 mmol/L (ref 22–32)
Calcium: 8.7 mg/dL — ABNORMAL LOW (ref 8.9–10.3)
Chloride: 101 mmol/L (ref 101–111)
Creatinine, Ser: 0.82 mg/dL (ref 0.61–1.24)
GFR calc Af Amer: >60 mL/min (ref >60)
GFR calc non Af Amer: >60 mL/min (ref >60)
Glucose, Bld: 209 mg/dL — ABNORMAL HIGH (ref 65–99)
Potassium: 3.7 mmol/L (ref 3.5–5.1)
Sodium: 135 mmol/L (ref 135–145)

## 2017-01-19 LAB — HEMOGLOBIN A1C
Hgb A1c MFr Bld: 6.7 % — ABNORMAL HIGH (ref 4.8–5.6)
Mean Plasma Glucose: 145.59 mg/dL

## 2017-01-19 LAB — TROPONIN I
Troponin I: <0.03 ng/mL (ref <0.03)
Troponin I: <0.03 ng/mL (ref <0.03)

## 2017-01-19 NOTE — Progress Notes (Addendum)
PROGRESS NOTE  Matthew May XKG:[id number] DOB: 1946-04-20 DOA: 01/18/2017 PCP: Sharion Balloon, FNP  Brief History:  71 y.o. male with medical history of coronary artery disease status post CABG, hypertension, hyperlipidemia, aortic stenosis, hypothyroidism presented with 3-4 day history of left leg pain. The patient denies any recent injury, trauma, or insect bites. The patient presented to the emergency department on the morning of 01/17/2017. He was discharged home with doxycycline, indomethacin, and colchicine. Since that period of time, he states that the erythema in his left leg has progressed proximally, and it has been more swollen. He states that the pain is about the same as it was on 01/17/2017.  Because of worsening redness, and swelling, the patient presented back to the emergency department on 01/18/2017. In the emergency department, the patient was afebrile and hemodynamically stable centering on 90% on room air. WBC was 12.1.  BMP was unremarkable. Left lower extremity ultrasound was negative for DVT. Because of failure to respond to oral antibiotics, the patient was admitted for intravenous antibiotics for left lower extremity cellulitis.  Assessment/Plan: Left lower extremity cellulitis -The patient failed oral doxycycline as outpatient -continue cefazolin--improving -Check CPK--83 -WBC improving -am CBC  Atypical chest discomfort /Coronary artery disease -Cycle troponins--neg x 3 -11/06/2016 echo EF 55-60%, no WMA, grade 1 daily, severe AS -Continue aspirin and Lipitor  Severe aortic stenosis  -Patient follows Dr. Percival Spanish -He has been essentially asymptomatic   Diabetes mellitus type 2 -Holding metformin -12/19/2016 hemoglobin A1c 7.0 -d/c SSI  Essential hypertension -Continue ARB  Hypothyroidism -Continue levothyroxine  Hyperlipidemia -Continue statin  Obesity (BMI 30-39.9)    Disposition Plan:   Home 8/18 if stable Family  Communication:   Sister updated at bedside 8/17  Consultants: none   Code Status:  FULL   DVT Prophylaxis:  [place] Lovenox   Procedures: As Listed in Progress Note Above  Antibiotics: Cefazolin 8/16>>>    Subjective: Patient states that the sharp pain (mild) in his left lower extremity is improving. It is isolated to the pretibial area and calf. He denies any chest pain, suspect, nausea, vomiting, diarrhea, abdominal pain. No dysuria or hematuria.  Objective: Vitals:   01/18/17 1921 01/18/17 2245 01/19/17 0528 01/19/17 1407  BP: (!) 144/63 (!) 131/44 (!) 118/57 128/61  Pulse: 77 69 82 75  Resp: 16 16 16 16   Temp: 97.8 F (36.6 C) 98.2 F (36.8 C) 97.7 F (36.5 C) 98.6 F (37 C)  TempSrc:  Oral Oral Oral  SpO2: 98% 98% 96% 98%  Weight: 103 kg (227 lb)     Height:        Intake/Output Summary (Last 24 hours) at 01/19/17 1739 Last data filed at 01/19/17 1300  Gross per 24 hour  Intake              880 ml  Output                0 ml  Net              880 ml   Weight change:  Exam:   General:  Pt is alert, follows commands appropriately, not in acute distress  HEENT: No icterus, No thrush, No neck mass, [place]/AT  Cardiovascular: RRR, S1/S2, no rubs, no gallops  Respiratory: CTA bilaterally, no wheezing, no crackles, no rhonchi  Abdomen: Soft/+BS, non tender, non distended, no guarding  Extremities: 1 + LLE without necrosis--see pictures below  Data Reviewed: I have personally reviewed following labs and imaging studies Basic Metabolic Panel:  Recent Labs Lab 01/17/17 0036 01/18/17 1345 01/19/17 0634  NA 141 136 135  K 3.8 3.8 3.7  CL 104 100* 101  CO2 27 26 26   GLUCOSE 207* 179* 209*  BUN 14 16 15   CREATININE 0.93 1.05 0.82  CALCIUM 9.0 9.0 8.7*   Liver Function Tests: No results for input(s): AST, ALT, ALKPHOS, BILITOT, PROT, ALBUMIN in the last 168 hours. No results for input(s): LIPASE, AMYLASE in the last 168 hours. No results for  input(s): AMMONIA in the last 168 hours. Coagulation Profile: No results for input(s): INR, PROTIME in the last 168 hours. CBC:  Recent Labs Lab 01/17/17 0036 01/18/17 1345 01/19/17 0634  WBC 10.1 12.1* 9.4  NEUTROABS 7.3 9.8*  --   HGB 12.8* 12.7* 12.6*  HCT 38.2* 38.0* 37.5*  MCV 89.7 88.2 89.1  PLT 170 149* 165   Cardiac Enzymes:  Recent Labs Lab 01/18/17 1754 01/19/17 0018 01/19/17 0634  CKTOTAL 83  --   --   TROPONINI <0.03 <0.03 <0.03   BNP: Invalid input(s): POCBNP CBG: No results for input(s): GLUCAP in the last 168 hours. HbA1C:  Recent Labs  01/18/17 1345  HGBA1C 6.7*   Urine analysis:    Component Value Date/Time   COLORURINE YELLOW 09/30/2009 2108   APPEARANCEUR Clear 09/13/2016 0849   LABSPEC 1.017 09/30/2009 2108   PHURINE 6.0 09/30/2009 2108   GLUCOSEU Negative 09/13/2016 0849   HGBUR NEGATIVE 09/30/2009 2108   BILIRUBINUR Negative 09/13/2016 0849   KETONESUR NEGATIVE 09/30/2009 2108   PROTEINUR Negative 09/13/2016 0849   PROTEINUR NEGATIVE 09/30/2009 2108   UROBILINOGEN 1.0 09/30/2009 2108   NITRITE Negative 09/13/2016 0849   NITRITE NEGATIVE 09/30/2009 2108   LEUKOCYTESUR Negative 09/13/2016 0849   Sepsis Labs: @LABRCNTIP (procalcitonin:4,lacticidven:4) )No results found for this or any previous visit (from the past 240 hour(s)).   Scheduled Meds: . aspirin EC  81 mg Oral Daily  . atorvastatin  40 mg Oral q1800  . enoxaparin (LOVENOX) injection  40 mg Subcutaneous Q24H  . irbesartan  75 mg Oral Daily  . levothyroxine  125 mcg Oral QAC breakfast  . potassium chloride SA  20 mEq Oral Daily  . torsemide  40 mg Oral Daily  . Vitamin D (Ergocalciferol)  50,000 Units Oral Weekly   Continuous Infusions: .  ceFAZolin (ANCEF) IV 2 g (01/19/17 1719)    Procedures/Studies: US Venous Img Lower Unilateral Left  Result Date: 01/18/2017 CLINICAL DATA:  Left lower extremity pain and swelling for 2 days. EXAM: Left LOWER EXTREMITY VENOUS  DOPPLER ULTRASOUND TECHNIQUE: Gray-scale sonography with graded compression, as well as color Doppler and duplex ultrasound were performed to evaluate the lower extremity deep venous systems from the level of the common femoral vein and including the common femoral, femoral, profunda femoral, popliteal and calf veins including the posterior tibial, peroneal and gastrocnemius veins when visible. The superficial great saphenous vein was also interrogated. Spectral Doppler was utilized to evaluate flow at rest and with distal augmentation maneuvers in the common femoral, femoral and popliteal veins. COMPARISON:  Ultrasound of July 03, 2012. FINDINGS: Contralateral Common Femoral Vein: Respiratory phasicity is normal and symmetric with the symptomatic side. No evidence of thrombus. Normal compressibility. Common Femoral Vein: No evidence of thrombus. Normal compressibility, respiratory phasicity and response to augmentation. Saphenofemoral Junction: No evidence of thrombus. Normal compressibility and flow on color Doppler imaging. Profunda Femoral Vein: No evidence of thrombus.  Normal compressibility and flow on color Doppler imaging. Femoral Vein: No evidence of thrombus. Normal compressibility, respiratory phasicity and response to augmentation. Popliteal Vein: No evidence of thrombus. Normal compressibility, respiratory phasicity and response to augmentation. Calf Veins: No evidence of thrombus. Normal compressibility and flow on color Doppler imaging. Superficial Great Saphenous Vein: No evidence of thrombus. Normal compressibility and flow on color Doppler imaging. Venous Reflux:  None. Other Findings:  None. IMPRESSION: No evidence of DVT within the left lower extremity. Electronically Signed   By: Marijo Conception, M.D.   On: 01/18/2017 13:57    Phong Isenberg, DO  [facility] Pager [phone]  If 7PM-7AM, please contact night-coverage [email or web address] Password [email or web address] 01/19/2017, 5:39 PM   LOS: 1 day

## 2017-01-19 NOTE — Care Management Obs Status (Signed)
[facility] NOTIFICATION   Patient Details  Name: Matthew May MRN: [id number] Date of Birth: 22-Sep-1945   Medicare Observation Status Notification Given:  Yes    Sherald Barge, RN 01/19/2017, 11:44 AM

## 2017-01-20 LAB — CBC
HCT: 36.6 % — ABNORMAL LOW (ref 39.0–52.0)
Hemoglobin: 12.2 g/dL — ABNORMAL LOW (ref 13.0–17.0)
MCH: 29.9 pg (ref 26.0–34.0)
MCHC: 33.3 g/dL (ref 30.0–36.0)
MCV: 89.7 fL (ref 78.0–100.0)
Platelets: 165 10*3/uL (ref 150–400)
RBC: 4.08 MIL/uL — ABNORMAL LOW (ref 4.22–5.81)
RDW: 13.3 % (ref 11.5–15.5)
WBC: 7.5 10*3/uL (ref 4.0–10.5)

## 2017-01-20 MED ORDER — CEPHALEXIN 500 MG PO CAPS: 500.0000 mg | ORAL_CAPSULE | Freq: Four times a day (QID) | ORAL | Status: DC

## 2017-01-20 MED ORDER — CEPHALEXIN 500 MG PO CAPS: 500.0000 mg | ORAL_CAPSULE | Freq: Four times a day (QID) | ORAL | 0 refills | Status: DC

## 2017-01-20 NOTE — Progress Notes (Signed)
Patient IV removed, tolerated well. Patient and family given discharge instructions at bedside.  

## 2017-01-20 NOTE — Discharge Summary (Signed)
Physician Discharge Summary  Matthew May HYI:[id number] DOB: March 17, 1946 DOA: 01/18/2017  PCP: Sharion Balloon, FNP  Admit date: 01/18/2017 Discharge date: 01/20/2017  Admitted From:Home Disposition:  Home  Recommendations for Outpatient Follow-up:  1. Follow up with PCP in 1-2 weeks 2. Please obtain BMP/CBC in one week    Discharge Condition: Stable CODE STATUS: FULL Diet recommendation: Heart Healthy / Carb Modified  Brief/Interim Summary: 71 y.o.malewith medical history of coronary artery disease status post CABG, hypertension, hyperlipidemia, aortic stenosis, hypothyroidism presented with 3-4 day history of left leg pain. The patient denies any recent injury, trauma, or insect bites. The patient presented to the emergency department on the morning of 01/17/2017. He was discharged home with doxycycline, indomethacin, and colchicine. Since that period of time, he states that the erythema in his left leg has progressed proximally, and it has been more swollen. He states that the pain is about the same as it was on 01/17/2017.  Because of worsening redness, and swelling, the patient presented back to the emergency department on 01/18/2017. In the emergency department, the patient was afebrile and hemodynamically stable centering on 90% on room air. WBC was 12.1. BMP was unremarkable. Left lower extremity ultrasound was negative for DVT. Because of failure to respond to oral antibiotics, the patient was admitted for intravenous antibiotics for left lower extremity cellulitis.  Discharge Diagnoses:  Left lower extremity cellulitis -The patient failed oral doxycycline as outpatient -continue cefazolin--improving -Check CPK--83 -WBC improving -home with cephalexin x 5 more days to complete one week of tx  Atypical chest discomfort /Coronary artery disease -Cycle troponins--neg x 3 -11/06/2016 echo EF 55-60%, no WMA, grade 1 daily, severe AS -Continue aspirin and  Lipitor  Severe aortic stenosis  -Patient follows Dr. Percival Spanish -He has been essentially asymptomatic   Diabetes mellitus type 2 -Holding metformin -12/19/2016 hemoglobin A1c 7.0 -d/c SSI  Essential hypertension -Continue ARB  Hypothyroidism -Continue levothyroxine  Hyperlipidemia -Continue statin  Obesity (BMI 30-39.9)   Discharge Instructions  Discharge Instructions    Diet - low sodium heart healthy    Complete by:  As directed    Increase activity slowly    Complete by:  As directed      Allergies as of 01/20/2017      Reactions   Lisinopril Cough      Medication List    STOP taking these medications   colchicine 0.6 MG tablet   doxycycline 100 MG capsule Commonly known as:  VIBRAMYCIN   indomethacin 25 MG capsule Commonly known as:  INDOCIN     TAKE these medications   albuterol 108 (90 Base) MCG/ACT inhaler Commonly known as:  PROVENTIL HFA;VENTOLIN HFA Inhale 2 puffs into the lungs every 6 (six) hours as needed for wheezing or shortness of breath.   aspirin EC 81 MG tablet Take 81 mg by mouth daily.   atorvastatin 40 MG tablet Commonly known as:  LIPITOR TAKE 1 TABLET BY MOUTH  DAILY   cephALEXin 500 MG capsule Commonly known as:  KEFLEX Take 1 capsule (500 mg total) by mouth every 6 (six) hours.   levothyroxine 125 MCG tablet Commonly known as:  SYNTHROID, LEVOTHROID TAKE 1 TABLET BY MOUTH  DAILY   metFORMIN 1000 MG tablet Commonly known as:  GLUCOPHAGE TAKE 1 TABLET BY MOUTH TWO  TIMES DAILY   nitroGLYCERIN 0.4 MG SL tablet Commonly known as:  NITROSTAT Place 1 tablet (0.4 mg total) under the tongue every 5 (five) minutes as needed for chest pain.  potassium chloride SA 20 MEQ tablet Commonly known as:  K-DUR,KLOR-CON TAKE 1 TABLET BY MOUTH  EVERY DAY   torsemide 20 MG tablet Commonly known as:  DEMADEX TAKE 2 TABLETS BY MOUTH  DAILY   valsartan 80 MG tablet Commonly known as:  DIOVAN TAKE 1 TABLET BY MOUTH  DAILY    Vitamin D (Ergocalciferol) 50000 units Caps capsule Commonly known as:  DRISDOL Take 1 capsule (50,000 Units total) by mouth once a week. What changed:  Another medication with the same name was removed. Continue taking this medication, and follow the directions you see here.       Allergies  Allergen Reactions  . Lisinopril Cough    Consultations:  none   Procedures/Studies: US Venous Img Lower Unilateral Left  Result Date: 01/18/2017 CLINICAL DATA:  Left lower extremity pain and swelling for 2 days. EXAM: Left LOWER EXTREMITY VENOUS DOPPLER ULTRASOUND TECHNIQUE: Gray-scale sonography with graded compression, as well as color Doppler and duplex ultrasound were performed to evaluate the lower extremity deep venous systems from the level of the common femoral vein and including the common femoral, femoral, profunda femoral, popliteal and calf veins including the posterior tibial, peroneal and gastrocnemius veins when visible. The superficial great saphenous vein was also interrogated. Spectral Doppler was utilized to evaluate flow at rest and with distal augmentation maneuvers in the common femoral, femoral and popliteal veins. COMPARISON:  Ultrasound of July 03, 2012. FINDINGS: Contralateral Common Femoral Vein: Respiratory phasicity is normal and symmetric with the symptomatic side. No evidence of thrombus. Normal compressibility. Common Femoral Vein: No evidence of thrombus. Normal compressibility, respiratory phasicity and response to augmentation. Saphenofemoral Junction: No evidence of thrombus. Normal compressibility and flow on color Doppler imaging. Profunda Femoral Vein: No evidence of thrombus. Normal compressibility and flow on color Doppler imaging. Femoral Vein: No evidence of thrombus. Normal compressibility, respiratory phasicity and response to augmentation. Popliteal Vein: No evidence of thrombus. Normal compressibility, respiratory phasicity and response to augmentation.  Calf Veins: No evidence of thrombus. Normal compressibility and flow on color Doppler imaging. Superficial Great Saphenous Vein: No evidence of thrombus. Normal compressibility and flow on color Doppler imaging. Venous Reflux:  None. Other Findings:  None. IMPRESSION: No evidence of DVT within the left lower extremity. Electronically Signed   By: Marijo Conception, M.D.   On: 01/18/2017 13:57        Discharge Exam: Vitals:   01/19/17 2147 01/20/17 0550  BP: (!) 127/53 139/86  Pulse: 64 64  Resp: 16 16  Temp: 98 F (36.7 C) 97.6 F (36.4 C)  SpO2: 98% 98%   Vitals:   01/19/17 0528 01/19/17 1407 01/19/17 2147 01/20/17 0550  BP: (!) 118/57 128/61 (!) 127/53 139/86  Pulse: 82 75 64 64  Resp: 16 16 16 16   Temp: 97.7 F (36.5 C) 98.6 F (37 C) 98 F (36.7 C) 97.6 F (36.4 C)  TempSrc: Oral Oral Oral Oral  SpO2: 96% 98% 98% 98%  Weight:      Height:        General: Pt is alert, awake, not in acute distress Cardiovascular: RRR, S1/S2 +, no rubs, no gallops Respiratory: CTA bilaterally, no wheezing, no rhonchi Abdominal: Soft, NT, ND, bowel sounds + Extremities: no edema, no cyanosis   The results of significant diagnostics from this hospitalization (including imaging, microbiology, ancillary and laboratory) are listed below for reference.    Significant Diagnostic Studies: US Venous Img Lower Unilateral Left  Result Date: 01/18/2017 CLINICAL DATA:  Left lower extremity pain and swelling for 2 days. EXAM: Left LOWER EXTREMITY VENOUS DOPPLER ULTRASOUND TECHNIQUE: Gray-scale sonography with graded compression, as well as color Doppler and duplex ultrasound were performed to evaluate the lower extremity deep venous systems from the level of the common femoral vein and including the common femoral, femoral, profunda femoral, popliteal and calf veins including the posterior tibial, peroneal and gastrocnemius veins when visible. The superficial great saphenous vein was also interrogated.  Spectral Doppler was utilized to evaluate flow at rest and with distal augmentation maneuvers in the common femoral, femoral and popliteal veins. COMPARISON:  Ultrasound of July 03, 2012. FINDINGS: Contralateral Common Femoral Vein: Respiratory phasicity is normal and symmetric with the symptomatic side. No evidence of thrombus. Normal compressibility. Common Femoral Vein: No evidence of thrombus. Normal compressibility, respiratory phasicity and response to augmentation. Saphenofemoral Junction: No evidence of thrombus. Normal compressibility and flow on color Doppler imaging. Profunda Femoral Vein: No evidence of thrombus. Normal compressibility and flow on color Doppler imaging. Femoral Vein: No evidence of thrombus. Normal compressibility, respiratory phasicity and response to augmentation. Popliteal Vein: No evidence of thrombus. Normal compressibility, respiratory phasicity and response to augmentation. Calf Veins: No evidence of thrombus. Normal compressibility and flow on color Doppler imaging. Superficial Great Saphenous Vein: No evidence of thrombus. Normal compressibility and flow on color Doppler imaging. Venous Reflux:  None. Other Findings:  None. IMPRESSION: No evidence of DVT within the left lower extremity. Electronically Signed   By: Marijo Conception, M.D.   On: 01/18/2017 13:57     Microbiology: No results found for this or any previous visit (from the past 240 hour(s)).   Labs: Basic Metabolic Panel:  Recent Labs Lab 01/17/17 0036 01/18/17 1345 01/19/17 0634  NA 141 136 135  K 3.8 3.8 3.7  CL 104 100* 101  CO2 27 26 26   GLUCOSE 207* 179* 209*  BUN 14 16 15   CREATININE 0.93 1.05 0.82  CALCIUM 9.0 9.0 8.7*   Liver Function Tests: No results for input(s): AST, ALT, ALKPHOS, BILITOT, PROT, ALBUMIN in the last 168 hours. No results for input(s): LIPASE, AMYLASE in the last 168 hours. No results for input(s): AMMONIA in the last 168 hours. CBC:  Recent Labs Lab  01/17/17 0036 01/18/17 1345 01/19/17 0634 01/20/17 0608  WBC 10.1 12.1* 9.4 7.5  NEUTROABS 7.3 9.8*  --   --   HGB 12.8* 12.7* 12.6* 12.2*  HCT 38.2* 38.0* 37.5* 36.6*  MCV 89.7 88.2 89.1 89.7  PLT 170 149* 165 165   Cardiac Enzymes:  Recent Labs Lab 01/18/17 1754 01/19/17 0018 01/19/17 0634  CKTOTAL 83  --   --   TROPONINI <0.03 <0.03 <0.03   BNP: Invalid input(s): POCBNP CBG: No results for input(s): GLUCAP in the last 168 hours.  Time coordinating discharge:  Greater than 30 minutes  Signed:  Mireille Lacombe, DO [facility] Pager: [phone] 01/20/2017, 10:54 AM

## 2017-02-13 ENCOUNTER — Encounter: Payer: Self-pay | Admitting: Physician Assistant

## 2017-02-13 ENCOUNTER — Ambulatory Visit (INDEPENDENT_AMBULATORY_CARE_PROVIDER_SITE_OTHER): Payer: Medicare Other | Admitting: Physician Assistant

## 2017-02-13 VITALS — BP 125/68 | HR 79 | Temp 98.3°F | Ht 67.5 in | Wt 221.0 lb

## 2017-02-13 DIAGNOSIS — L03115 Cellulitis of right lower limb: Secondary | ICD-10-CM

## 2017-02-13 MED ORDER — CLINDAMYCIN HCL 300 MG PO CAPS: 300.0000 mg | ORAL_CAPSULE | Freq: Three times a day (TID) | ORAL | 1 refills | Status: DC

## 2017-02-13 NOTE — Patient Instructions (Signed)
In a few days you may receive a survey in the mail or online from [employer] regarding your visit with us today. Please take a moment to fill this out. Your feedback is very important to our whole office. It can help us better understand your needs as well as improve your experience and satisfaction. Thank you for taking your time to complete it. We care about you.  Jaelee Laughter, PA-C  

## 2017-02-14 ENCOUNTER — Ambulatory Visit: Payer: Medicare Other | Admitting: Family Medicine

## 2017-02-14 LAB — CBC WITH DIFFERENTIAL/PLATELET
Basophils Absolute: 0 10*3/uL (ref 0.0–0.2)
Basos: 0 %
EOS (ABSOLUTE): 0.1 10*3/uL (ref 0.0–0.4)
Eos: 1 %
Hematocrit: 36.9 % — ABNORMAL LOW (ref 37.5–51.0)
Hemoglobin: 12.5 g/dL — ABNORMAL LOW (ref 13.0–17.7)
Immature Grans (Abs): 0 10*3/uL (ref 0.0–0.1)
Immature Granulocytes: 0 %
Lymphocytes Absolute: 1.8 10*3/uL (ref 0.7–3.1)
Lymphs: 16 %
MCH: 29.1 pg (ref 26.6–33.0)
MCHC: 33.9 g/dL (ref 31.5–35.7)
MCV: 86 fL (ref 79–97)
Monocytes Absolute: 0.7 10*3/uL (ref 0.1–0.9)
Monocytes: 6 %
Neutrophils Absolute: 8.4 10*3/uL — ABNORMAL HIGH (ref 1.4–7.0)
Neutrophils: 77 %
Platelets: 175 10*3/uL (ref 150–379)
RBC: 4.3 x10E6/uL (ref 4.14–5.80)
RDW: 13.8 % (ref 12.3–15.4)
WBC: 11.1 10*3/uL — ABNORMAL HIGH (ref 3.4–10.8)

## 2017-02-14 LAB — CMP14+EGFR
ALT: 15 IU/L (ref 0–44)
AST: 14 IU/L (ref 0–40)
Albumin/Globulin Ratio: 1.6 (ref 1.2–2.2)
Albumin: 4.2 g/dL (ref 3.6–4.8)
Alkaline Phosphatase: 76 IU/L (ref 39–117)
BUN/Creatinine Ratio: 16 (ref 10–24)
BUN: 15 mg/dL (ref 8–27)
Bilirubin Total: 0.7 mg/dL (ref 0.0–1.2)
CO2: 21 mmol/L (ref 20–29)
Calcium: 8.7 mg/dL (ref 8.6–10.2)
Chloride: 96 mmol/L (ref 96–106)
Creatinine, Ser: 0.95 mg/dL (ref 0.76–1.27)
GFR calc Af Amer: 94 mL/min/{1.73_m2} (ref >59)
GFR calc non Af Amer: 81 mL/min/{1.73_m2} (ref >59)
Globulin, Total: 2.7 g/dL (ref 1.5–4.5)
Glucose: 159 mg/dL — ABNORMAL HIGH (ref 65–99)
Potassium: 3.8 mmol/L (ref 3.5–5.2)
Sodium: 139 mmol/L (ref 134–144)
Total Protein: 6.9 g/dL (ref 6.0–8.5)

## 2017-02-14 NOTE — Progress Notes (Signed)
Patient labs given by Georgina Pillion.

## 2017-02-14 NOTE — Progress Notes (Signed)
BP 125/68   Pulse 79   Temp 98.3 F (36.8 C) (Oral)   Ht 5' 7.5" (1.715 m)   Wt 221 lb (100.2 kg)   BMI 34.10 kg/m    Subjective:    Patient ID: Matthew May, male    DOB: [birth year], 71 y.o.   MRN: [id number]  HPI: Matthew May is a 71 y.o. male presenting on 02/13/2017 for Redness and swelling in right leg (fever, chills and vomiting yesterday, fever today)  Approximately one month ago the patient was hospitalized for cellulitis in the left leg. He feels that it is getting better. However his right leg has begun in the past few days. It has started the same way. He did complete his antibiotic once he left the hospital, it was Keflex. He denies any fever or chills. He denies any shortness of breath. The patient has had a vein harvested from the right leg many years ago.  Relevant past medical, surgical, family and social history reviewed and updated as indicated. Allergies and medications reviewed and updated.  Past Medical History:  Diagnosis Date  . CAD (coronary artery disease)    a. s/p NSTEMI 4/11 => s/p CABG (L-LAD, S-OM2, S-PDA/PL);  b.  ETT-Myoview 6/14:  normal study, no ischemia, EF 67%  . Carotid stenosis    Carotid U/S 4/09:  RICA 8-11%, LICA 91-47%; right vertebral flow retrograde suggestive of steel-consider PV consult  . COPD (chronic obstructive pulmonary disease) ([place])   . HLD (hyperlipidemia)   . HTN (hypertension)   . Hx of echocardiogram    Echo 6/14: Mod LVH, focal basal hypertrophy, EF 50-55%, mild AS (mean 17 mmHg) and mild AI, mild to mod LAE, mild RAE  . Leg edema   . Obesity   . Renal insufficiency   . Subclavian artery stenosis, right ([place])    based upon carotid U/S done 11/2012    Past Surgical History:  Procedure Laterality Date  . CORONARY ARTERY BYPASS GRAFT     2011, LIMA to LAD coronary artery, SVG to OM2 branch of lect circumflex coronary artery, and a sequential SVG to psot descening to posterolateral branches to RCA  . endoscopic  vein harvesting     right leg   . HERNIA REPAIR    . TONSILLECTOMY      Review of Systems  Constitutional: Negative.  Negative for appetite change and fatigue.  HENT: Negative.   Eyes: Negative.  Negative for pain and visual disturbance.  Respiratory: Negative.  Negative for cough, chest tightness, shortness of breath and wheezing.   Cardiovascular: Negative.  Negative for chest pain, palpitations and leg swelling.  Gastrointestinal: Negative.  Negative for abdominal pain, diarrhea, nausea and vomiting.  Endocrine: Negative.   Genitourinary: Negative.   Musculoskeletal: Negative.   Skin: Positive for color change and rash. Negative for wound.  Neurological: Negative.  Negative for weakness, numbness and headaches.  Psychiatric/Behavioral: Negative.     Allergies as of 02/13/2017      Reactions   Lisinopril Cough      Medication List       Accurate as of 02/13/17 11:59 PM. Always use your most recent med list.          albuterol 108 (90 Base) MCG/ACT inhaler Commonly known as:  PROVENTIL HFA;VENTOLIN HFA Inhale 2 puffs into the lungs every 6 (six) hours as needed for wheezing or shortness of breath.   aspirin EC 81 MG tablet Take 81 mg by mouth daily.  atorvastatin 40 MG tablet Commonly known as:  LIPITOR TAKE 1 TABLET BY MOUTH  DAILY   clindamycin 300 MG capsule Commonly known as:  CLEOCIN Take 1 capsule (300 mg total) by mouth 3 (three) times daily.   levothyroxine 125 MCG tablet Commonly known as:  SYNTHROID, LEVOTHROID TAKE 1 TABLET BY MOUTH  DAILY   metFORMIN 1000 MG tablet Commonly known as:  GLUCOPHAGE TAKE 1 TABLET BY MOUTH TWO  TIMES DAILY   nitroGLYCERIN 0.4 MG SL tablet Commonly known as:  NITROSTAT Place 1 tablet (0.4 mg total) under the tongue every 5 (five) minutes as needed for chest pain.   potassium chloride SA 20 MEQ tablet Commonly known as:  K-DUR,KLOR-CON TAKE 1 TABLET BY MOUTH  EVERY DAY   torsemide 20 MG tablet Commonly known as:   DEMADEX TAKE 2 TABLETS BY MOUTH  DAILY   valsartan 80 MG tablet Commonly known as:  DIOVAN TAKE 1 TABLET BY MOUTH  DAILY   Vitamin D (Ergocalciferol) 50000 units Caps capsule Commonly known as:  DRISDOL Take 1 capsule (50,000 Units total) by mouth once a week.            Discharge Care Instructions        Start     Ordered   02/13/17 0000  clindamycin (CLEOCIN) 300 MG capsule  3 times daily    Question:  Supervising Provider  Answer:  Timmothy Euler   02/13/17 1738   02/13/17 0000  CBC with Differential     02/13/17 1742   02/13/17 0000  CMP14+EGFR     02/13/17 1742         Objective:    BP 125/68   Pulse 79   Temp 98.3 F (36.8 C) (Oral)   Ht 5' 7.5" (1.715 m)   Wt 221 lb (100.2 kg)   BMI 34.10 kg/m   Allergies  Allergen Reactions  . Lisinopril Cough    Physical Exam  Constitutional: He appears well-developed and well-nourished.  HENT:  Head: Normocephalic and atraumatic.  Eyes: Pupils are equal, round, and reactive to light. Conjunctivae and EOM are normal.  Neck: Normal range of motion. Neck supple.  Cardiovascular: Normal rate, regular rhythm and normal heart sounds.   Pulmonary/Chest: Effort normal and breath sounds normal.  Abdominal: Soft. Bowel sounds are normal.  Musculoskeletal: Normal range of motion.  Skin: Skin is warm and dry. Rash noted. Rash is macular. There is erythema.     Swelling in right lower leg with edema. There is a demarcation line on the anterior shin and to the mid calf on the back. Pulses are good, temperature is warm, there are no open wounds    Results for orders placed or performed in visit on 02/13/17  CBC with Differential  Result Value Ref Range   WBC 11.1 (H) 3.4 - 10.8 x10E3/uL   RBC 4.30 4.14 - 5.80 x10E6/uL   Hemoglobin 12.5 (L) 13.0 - 17.7 g/dL   Hematocrit 36.9 (L) 37.5 - 51.0 %   MCV 86 79 - 97 fL   MCH 29.1 26.6 - 33.0 pg   MCHC 33.9 31.5 - 35.7 g/dL   RDW 13.8 12.3 - 15.4 %   Platelets 175 150 -  379 x10E3/uL   Neutrophils 77 Not Estab. %   Lymphs 16 Not Estab. %   Monocytes 6 Not Estab. %   Eos 1 Not Estab. %   Basos 0 Not Estab. %   Neutrophils Absolute 8.4 (H) 1.4 - 7.0  x10E3/uL   Lymphocytes Absolute 1.8 0.7 - 3.1 x10E3/uL   Monocytes Absolute 0.7 0.1 - 0.9 x10E3/uL   EOS (ABSOLUTE) 0.1 0.0 - 0.4 x10E3/uL   Basophils Absolute 0.0 0.0 - 0.2 x10E3/uL   Immature Granulocytes 0 Not Estab. %   Immature Grans (Abs) 0.0 0.0 - 0.1 x10E3/uL  CMP14+EGFR  Result Value Ref Range   Glucose 159 (H) 65 - 99 mg/dL   BUN 15 8 - 27 mg/dL   Creatinine, Ser 0.95 0.76 - 1.27 mg/dL   GFR calc non Af Amer 81 >59 mL/min/1.73   GFR calc Af Amer 94 >59 mL/min/1.73   BUN/Creatinine Ratio 16 10 - 24   Sodium 139 134 - 144 mmol/L   Potassium 3.8 3.5 - 5.2 mmol/L   Chloride 96 96 - 106 mmol/L   CO2 21 20 - 29 mmol/L   Calcium 8.7 8.6 - 10.2 mg/dL   Total Protein 6.9 6.0 - 8.5 g/dL   Albumin 4.2 3.6 - 4.8 g/dL   Globulin, Total 2.7 1.5 - 4.5 g/dL   Albumin/Globulin Ratio 1.6 1.2 - 2.2   Bilirubin Total 0.7 0.0 - 1.2 mg/dL   Alkaline Phosphatase 76 39 - 117 IU/L   AST 14 0 - 40 IU/L   ALT 15 0 - 44 IU/L      Assessment & Plan:   1. Cellulitis of right lower extremity - clindamycin (CLEOCIN) 300 MG capsule; Take 1 capsule (300 mg total) by mouth 3 (three) times daily.  Dispense: 30 capsule; Refill: 1 - CBC with Differential - CMP14+EGFR    Current Outpatient Prescriptions:  .  albuterol (PROVENTIL HFA;VENTOLIN HFA) 108 (90 Base) MCG/ACT inhaler, Inhale 2 puffs into the lungs every 6 (six) hours as needed for wheezing or shortness of breath., Disp: 1 Inhaler, Rfl: 1 .  aspirin EC 81 MG tablet, Take 81 mg by mouth daily., Disp: , Rfl:  .  atorvastatin (LIPITOR) 40 MG tablet, TAKE 1 TABLET BY MOUTH  DAILY, Disp: 90 tablet, Rfl: 1 .  levothyroxine (SYNTHROID, LEVOTHROID) 125 MCG tablet, TAKE 1 TABLET BY MOUTH  DAILY, Disp: 90 tablet, Rfl: 3 .  metFORMIN (GLUCOPHAGE) 1000 MG tablet, TAKE 1  TABLET BY MOUTH TWO  TIMES DAILY, Disp: 180 tablet, Rfl: 0 .  nitroGLYCERIN (NITROSTAT) 0.4 MG SL tablet, Place 1 tablet (0.4 mg total) under the tongue every 5 (five) minutes as needed for chest pain., Disp: 25 tablet, Rfl: prn .  potassium chloride SA (K-DUR,KLOR-CON) 20 MEQ tablet, TAKE 1 TABLET BY MOUTH  EVERY DAY, Disp: 90 tablet, Rfl: 2 .  torsemide (DEMADEX) 20 MG tablet, TAKE 2 TABLETS BY MOUTH  DAILY, Disp: 180 tablet, Rfl: 1 .  valsartan (DIOVAN) 80 MG tablet, TAKE 1 TABLET BY MOUTH  DAILY, Disp: 90 tablet, Rfl: 1 .  Vitamin D, Ergocalciferol, (DRISDOL) 50000 units CAPS capsule, Take 1 capsule (50,000 Units total) by mouth once a week., Disp: 12 capsule, Rfl: 1 .  clindamycin (CLEOCIN) 300 MG capsule, Take 1 capsule (300 mg total) by mouth 3 (three) times daily., Disp: 30 capsule, Rfl: 1 Continue all other maintenance medications as listed above.  Follow up plan: Return if symptoms worsen or fail to improve.  Educational handout given for [place] PA-C [place] [address]  [place], [place] [address] [phone]   02/14/2017, 2:00 PM

## 2017-03-14 DIAGNOSIS — G44219 Episodic tension-type headache, not intractable: Secondary | ICD-10-CM | POA: Diagnosis not present

## 2017-03-14 DIAGNOSIS — H40033 Anatomical narrow angle, bilateral: Secondary | ICD-10-CM | POA: Diagnosis not present

## 2017-03-20 DIAGNOSIS — H029 Unspecified disorder of eyelid: Secondary | ICD-10-CM | POA: Diagnosis not present

## 2017-03-20 DIAGNOSIS — E119 Type 2 diabetes mellitus without complications: Secondary | ICD-10-CM | POA: Diagnosis not present

## 2017-03-20 DIAGNOSIS — H25812 Combined forms of age-related cataract, left eye: Secondary | ICD-10-CM | POA: Diagnosis not present

## 2017-03-20 DIAGNOSIS — L720 Epidermal cyst: Secondary | ICD-10-CM | POA: Diagnosis not present

## 2017-03-20 DIAGNOSIS — H2512 Age-related nuclear cataract, left eye: Secondary | ICD-10-CM | POA: Diagnosis not present

## 2017-03-20 DIAGNOSIS — H02831 Dermatochalasis of right upper eyelid: Secondary | ICD-10-CM | POA: Diagnosis not present

## 2017-03-21 DIAGNOSIS — D438 Neoplasm of uncertain behavior of other specified parts of central nervous system: Secondary | ICD-10-CM | POA: Diagnosis not present

## 2017-03-21 DIAGNOSIS — D485 Neoplasm of uncertain behavior of skin: Secondary | ICD-10-CM | POA: Diagnosis not present

## 2017-04-22 ENCOUNTER — Other Ambulatory Visit: Payer: Self-pay | Admitting: Family

## 2017-04-23 ENCOUNTER — Encounter: Payer: Self-pay | Admitting: Family

## 2017-04-23 ENCOUNTER — Ambulatory Visit: Payer: Medicare Other | Admitting: Family

## 2017-04-23 VITALS — BP 128/64 | HR 77 | Temp 97.7°F | Ht 67.5 in | Wt 232.8 lb

## 2017-04-23 DIAGNOSIS — I10 Essential (primary) hypertension: Secondary | ICD-10-CM

## 2017-04-23 DIAGNOSIS — E1165 Type 2 diabetes mellitus with hyperglycemia: Secondary | ICD-10-CM

## 2017-04-23 DIAGNOSIS — E785 Hyperlipidemia, unspecified: Secondary | ICD-10-CM | POA: Diagnosis not present

## 2017-04-23 DIAGNOSIS — Z1211 Encounter for screening for malignant neoplasm of colon: Secondary | ICD-10-CM

## 2017-04-23 DIAGNOSIS — E038 Other specified hypothyroidism: Secondary | ICD-10-CM | POA: Diagnosis not present

## 2017-04-23 DIAGNOSIS — E559 Vitamin D deficiency, unspecified: Secondary | ICD-10-CM

## 2017-04-23 DIAGNOSIS — E1159 Type 2 diabetes mellitus with other circulatory complications: Secondary | ICD-10-CM | POA: Diagnosis not present

## 2017-04-23 DIAGNOSIS — I2510 Atherosclerotic heart disease of native coronary artery without angina pectoris: Secondary | ICD-10-CM

## 2017-04-23 DIAGNOSIS — E1169 Type 2 diabetes mellitus with other specified complication: Secondary | ICD-10-CM

## 2017-04-23 DIAGNOSIS — I152 Hypertension secondary to endocrine disorders: Secondary | ICD-10-CM

## 2017-04-23 LAB — BAYER DCA HB A1C WAIVED: HB A1C (BAYER DCA - WAIVED): 7.1 % — ABNORMAL HIGH (ref <7.0)

## 2017-04-23 MED ORDER — NITROGLYCERIN 0.4 MG SL SUBL: 0.4000 mg | SUBLINGUAL_TABLET | SUBLINGUAL | 99 refills | Status: DC | PRN

## 2017-04-23 NOTE — Progress Notes (Signed)
Subjective:    Patient ID: Matthew May, male    DOB: 24-Oct-1945, 71 y.o.   MRN: [id number]  Pt presents to the office today for chronic follow up. Pt is followed by Cardiologists every 3-6 months for CAD.  Diabetes  He presents for his follow-up diabetic visit. He has type 2 diabetes mellitus. His disease course has been stable. There are no hypoglycemic associated symptoms. There are no diabetic associated symptoms. Pertinent negatives for diabetes include no blurred vision, no foot paresthesias and no foot ulcerations. Symptoms are stable. Diabetic complications include heart disease. Pertinent negatives for diabetic complications include no nephropathy. Risk factors for coronary artery disease include diabetes mellitus, dyslipidemia, obesity, male sex, hypertension and sedentary lifestyle. He is following a generally unhealthy diet. His breakfast blood glucose range is generally 130-140 mg/dl. Eye exam is current.  Hypertension  This is a chronic problem. The current episode started more than 1 year ago. The problem has been resolved since onset. The problem is controlled. Associated symptoms include peripheral edema ("in my legs"). Pertinent negatives include no blurred vision or shortness of breath. Risk factors for coronary artery disease include diabetes mellitus, dyslipidemia, obesity and male gender. Hypertensive end-organ damage includes CAD/MI. There is no history of heart failure.  Hyperlipidemia  This is a chronic problem. The current episode started more than 1 year ago. The problem is controlled. Recent lipid tests were reviewed and are normal. Exacerbating diseases include obesity. Pertinent negatives include no shortness of breath. Current antihyperlipidemic treatment includes statins. The current treatment provides moderate improvement of lipids. Risk factors for coronary artery disease include diabetes mellitus, dyslipidemia, male sex, obesity and a sedentary lifestyle.       Review of Systems  Eyes: Negative for blurred vision.  Respiratory: Negative for shortness of breath.   All other systems reviewed and are negative.      Objective:   Physical Exam  Constitutional: He is oriented to person, place, and time. He appears well-developed and well-nourished. No distress.  HENT:  Head: Normocephalic.  Right Ear: External ear normal.  Left Ear: External ear normal.  Nose: Nose normal.  Mouth/Throat: Oropharynx is clear and moist.  Eyes: Pupils are equal, round, and reactive to light. Right eye exhibits no discharge. Left eye exhibits no discharge.  Neck: Normal range of motion. Neck supple. No thyromegaly present.  Cardiovascular: Normal rate, regular rhythm and intact distal pulses.  Murmur heard. Pulmonary/Chest: Effort normal and breath sounds normal. No respiratory distress. He has no wheezes.  Abdominal: Soft. Bowel sounds are normal. He exhibits no distension. There is no tenderness.  Musculoskeletal: Normal range of motion. He exhibits edema (2+ BLE). He exhibits no tenderness.  Neurological: He is alert and oriented to person, place, and time.  Skin: Skin is warm and dry. No rash noted. No erythema.  Psychiatric: He has a normal mood and affect. His behavior is normal. Judgment and thought content normal.  Vitals reviewed.     BP 128/64   Pulse 77   Temp 97.7 F (36.5 C) (Oral)   Ht 5' 7.5" (1.715 m)   Wt 232 lb 12.8 oz (105.6 kg)   BMI 35.92 kg/m      Assessment & Plan:  1. Type 2 diabetes mellitus with hyperglycemia, without long-term current use of insulin (HCC) - CMP14+EGFR - Bayer DCA Hb A1c Waived  2. Other specified hypothyroidism - CMP14+EGFR  3. Hyperlipidemia associated with type 2 diabetes mellitus (HCC) - CMP14+EGFR - Lipid panel  4.  Vitamin D deficiency - CMP14+EGFR - VITAMIN D 25 Hydroxy (Vit-D Deficiency, Fractures)  5. Morbid obesity ([place]) - CMP14+EGFR  6. Colon cancer screening - CMP14+EGFR -  Fecal occult blood, imunochemical; Future  7. Hypertension associated with diabetes ([place])  8. Atherosclerosis of native coronary artery of native heart without angina pectoris   Continue all meds Labs pending Health Maintenance reviewed Diet and exercise encouraged RTO 3 months and keep Cardiologists appt!!!  Evelina Dun, FNP

## 2017-04-23 NOTE — Patient Instructions (Signed)
 Exercise Guidelines During Cardiac Rehabilitation When you are recovering from a heart condition, such as from heart surgery, heart attack, or heart failure, it is important to have heart-healthy habits, including exercise routines. Discuss an appropriate exercise program with your heart specialist (cardiologist) and rehabilitation therapist. It is important to design a program that is safe and effective for you. The program should meet your specific abilities and needs. Walking, biking, jogging, and swimming are all good aerobic activities. These take light to moderate effort. Adding some light resistance training is also important. Even simple lifestyle changes can help. These lifestyle changes may include parking farther from the store or taking the stairs instead of the elevator. At first, you may begin exercising under supervision, such as at a hospital or clinic. Over time, you may begin exercising at home, with your health care provider's approval. Types of exercise Aerobic exercise  During cardiac rehabilitation, it is important to do aerobic activities. Aerobic exercise keeps joints and muscles moving. It involves large muscle groups. It is also rhythmic and must be done for a longer period of time. Doing these exercises improves circulation and endurance. Examples of aerobic exercise include:  Swimming.  Walking.  Hiking.  Jogging.  Cross-country skiing.  Biking.  Dancing. Static exercise  Static exercise (isometric exercise) uses muscles at high intensities without moving the joints. Some examples of static exercise include pushing against a heavy couch that does not move, doing a wall sit, or holding a plank position. Static exercise improves strength but also quickly increases blood pressure. Follow these guidelines:  If you have circulation problems or high blood pressure, talk with your health care provider before starting any static exercise routines. Do not do static  exercises if your health care provider tells you not to.  Do not hold your breath while doing static exercises. Holding your breath during static exercises can raise your blood pressure to a dangerously high level. Weight-resistance exercise  Weight-resistance exercises are another important part of rehabilitation. These exercises strengthen your muscles by making them work against resistance. Resistance exercises may help you return to activities of daily living sooner and improve your quality of life. They also help reduce cardiac risk factors. Examples of weight-resistance exercise include using:  Free weights.  Weight-lifting machines.  Large, specially designed rubber bands. You will usually do weight-resistance exercises 2 times a week with a 2-day rest period between workouts. Stretching Stretching before you exercise warms up your muscles and prevents injury. Stretching also improves your flexibility, balance, coordination, and range of motion. Follow these guidelines:  Stretch both before and after exercising.  Do not force a muscle or joint into a painful angle. Stretching should be a relaxing part of your exercise routine.  Once you feel resistance in your muscle, hold the stretch for a few seconds. Make sure you keep breathing while you hold the stretch.  Go slowly when doing all stretches. Setting a pace  Choose a pace that is comfortable for you.  You should be able to talk while exercising. If you are short of breath or unable to speak while you exercise, slow down.  If you are able to sing while exercising, you are not exercising hard enough.  Keep track of how hard you are working as you exercise (exertion level). Your rehabilitation therapist can teach you to use a mental scale to measure your level of exertion (perceived exertion). Using a mental scale, you will think about your exertion level and rate it in   a range from 6 to 20.  A rating of 6 to 9. This means that  you are doing "very light" exercise and are not exerting yourself enough. For a healthy person, this may be walking at a slow pace.  A rating of 11 to 15. This is exercise that is "somewhat hard." For a healthy exercise session, you should aim for an exertion rate that is within this range.  A rating of 16 to 17. This is considered "very hard" or strenuous. For a healthy person, exercise at this rating may start to feel heavy and difficult.  A rating of 19 to 20. This means that you are working "extremely hard." For most people, these numbers represent the hardest you've ever worked to exercise.  Your health care provider or cardiac rehabilitation specialist may also recommend that you wear a heart rate monitor while you exercise. This will help you keep track of your heart rate zones and how hard your heart is working. Frequency As you are recovering, it is important to start exercising slowly and to gradually work up to your goal. Work with your health care provider to set up an exercise routine that works for you. Generally, cardiac rehabilitation exercise should include:  40 minutes of aerobic activity 3 - 4 days a week.  Stretching and strength exercises 2 - 3 days a week. Contact a doctor if:  You have any of the following symptoms while exercising:  Pain, pressure, or burning in your chest, jaw, shoulder, or back (angina).  Lightheadedness.  Dizziness.  Irregular or fast heartbeat.  Shortness of breath.  You are extremely tired after exercising. Get help right away if:  You have angina that does not get better with medicine and lasts for more than 5 minutes.  You have nausea or you vomit.  You have excessive sweating that is not caused by exercise. Summary  When you are recovering from a heart condition, it is important to have heart-healthy habits, including exercise routines.  At first, you may begin exercising under supervision, such as at a hospital or clinic. Over  time, you may begin exercising at home, with your health care provider's approval.  Aim for 40 minutes of aerobic exercises 3 - 4 days a week.  Aim to do stretching and strength exercises 2 - 3 days a week.  Choose a pace that is comfortable for you. You should be able to talk while exercising. This information is not intended to replace advice given to you by your health care provider. Make sure you discuss any questions you have with your health care provider. Document Released: 05/27/2013 Document Revised: 04/21/2016 Document Reviewed: 04/21/2016 Elsevier Interactive Patient Education  2017 [employer]  

## 2017-04-24 ENCOUNTER — Telehealth: Payer: Self-pay | Admitting: Cardiology

## 2017-04-24 ENCOUNTER — Other Ambulatory Visit: Payer: Self-pay | Admitting: Family

## 2017-04-24 ENCOUNTER — Other Ambulatory Visit: Payer: Medicare Other

## 2017-04-24 DIAGNOSIS — R0789 Other chest pain: Secondary | ICD-10-CM

## 2017-04-24 DIAGNOSIS — I350 Nonrheumatic aortic (valve) stenosis: Secondary | ICD-10-CM

## 2017-04-24 DIAGNOSIS — Z1211 Encounter for screening for malignant neoplasm of colon: Secondary | ICD-10-CM

## 2017-04-24 LAB — CMP14+EGFR
ALT: 22 IU/L (ref 0–44)
AST: 18 IU/L (ref 0–40)
Albumin/Globulin Ratio: 1.8 (ref 1.2–2.2)
Albumin: 4.4 g/dL (ref 3.6–4.8)
Alkaline Phosphatase: 78 IU/L (ref 39–117)
BUN/Creatinine Ratio: 17 (ref 10–24)
BUN: 15 mg/dL (ref 8–27)
Bilirubin Total: 0.4 mg/dL (ref 0.0–1.2)
CO2: 25 mmol/L (ref 20–29)
Calcium: 9.2 mg/dL (ref 8.6–10.2)
Chloride: 96 mmol/L (ref 96–106)
Creatinine, Ser: 0.87 mg/dL (ref 0.76–1.27)
GFR calc Af Amer: 102 mL/min/{1.73_m2} (ref >59)
GFR calc non Af Amer: 88 mL/min/{1.73_m2} (ref >59)
Globulin, Total: 2.5 g/dL (ref 1.5–4.5)
Glucose: 146 mg/dL — ABNORMAL HIGH (ref 65–99)
Potassium: 4.2 mmol/L (ref 3.5–5.2)
Sodium: 138 mmol/L (ref 134–144)
Total Protein: 6.9 g/dL (ref 6.0–8.5)

## 2017-04-24 LAB — LIPID PANEL
Chol/HDL Ratio: 4 ratio (ref 0.0–5.0)
Cholesterol, Total: 137 mg/dL (ref 100–199)
HDL: 34 mg/dL — ABNORMAL LOW (ref >39)
LDL Calculated: 77 mg/dL (ref 0–99)
Triglycerides: 130 mg/dL (ref 0–149)
VLDL Cholesterol Cal: 26 mg/dL (ref 5–40)

## 2017-04-24 LAB — VITAMIN D 25 HYDROXY (VIT D DEFICIENCY, FRACTURES): Vit D, 25-Hydroxy: 56 ng/mL (ref 30.0–100.0)

## 2017-04-24 NOTE — Telephone Encounter (Signed)
Left msg to call.

## 2017-04-24 NOTE — Telephone Encounter (Signed)
New message  Pt call requesting to speak with RN. Pt would like to be seen by Dr. Percival Spanish as soon as possible, but preferred to discuss what's going on with him with the RN. Please call back to discuss

## 2017-04-24 NOTE — Telephone Encounter (Signed)
He can follow with me or APP.

## 2017-04-24 NOTE — Telephone Encounter (Signed)
Spoke with patient who is c/o feeling "stuffy" on the left side of his chest and also feels like it is swollen.  He denies CP and reports he only feels SOB when it's cold outside.  In the past 2 to 3 weeks he has been having bouts of dizziness 2 to 3 times a day.  They revolve on their own.  BP has been running around 120/60s and HR 50 to 60s.  He was seen yesterday by Evelina Dun, NP though nothing documented as abnormal in cardiovascular exam except a notation of "heard murmur".  Pt's last 2 D Echo demonstrated an increase in his aortic valve stenosis.  The orders were to had echo repeated in 6 months.  This will be ordered to be completed at [facility] as requested by pt.  I will forward this information to Dr Percival Spanish for review and further recommendations/orders.

## 2017-04-25 ENCOUNTER — Encounter: Payer: Self-pay | Admitting: *Deleted

## 2017-04-25 ENCOUNTER — Ambulatory Visit (INDEPENDENT_AMBULATORY_CARE_PROVIDER_SITE_OTHER): Payer: Medicare Other | Admitting: *Deleted

## 2017-04-25 VITALS — BP 129/69 | HR 67 | Ht 67.0 in | Wt 234.0 lb

## 2017-04-25 DIAGNOSIS — Z0000 Encounter for general adult medical examination without abnormal findings: Secondary | ICD-10-CM | POA: Diagnosis not present

## 2017-04-25 NOTE — Progress Notes (Signed)
Subjective:   Matthew May is a 71 y.o. male who presents for an Initial Medicare Annual Wellness Visit. Matthew May is retired and lives at home with his wife. They have 3 adult children.   Review of Systems  Health is about the same as last year.   Cardiac Risk Factors include: advanced age (>38men, >84 women);dyslipidemia;hypertension;sedentary lifestyle;obesity (BMI >30kg/m2);male gender;diabetes mellitus. Recent visit with cardiologist. Having an echocardiogram in 05/2017 to follow up on valve disorder. Has had several recent episodes of angina that were relieved with 1 nitroglycerin.   Other systems negative today.     Objective:    Today's Vitals   04/25/17 1013  BP: 129/69  Pulse: 67  Weight: 234 lb (106.1 kg)  Height: 5\' 7"  (1.702 m)   Body mass index is 36.65 kg/m.  Current Medications (verified) Outpatient Encounter Medications as of 04/25/2017  Medication Sig  . albuterol (PROVENTIL HFA;VENTOLIN HFA) 108 (90 Base) MCG/ACT inhaler Inhale 2 puffs into the lungs every 6 (six) hours as needed for wheezing or shortness of breath.  Marland Kitchen aspirin EC 81 MG tablet Take 81 mg by mouth daily.  Marland Kitchen atorvastatin (LIPITOR) 40 MG tablet TAKE 1 TABLET BY MOUTH  DAILY  . levothyroxine (SYNTHROID, LEVOTHROID) 125 MCG tablet TAKE 1 TABLET BY MOUTH  DAILY  . metFORMIN (GLUCOPHAGE) 1000 MG tablet TAKE 1 TABLET BY MOUTH TWO  TIMES DAILY  . nitroGLYCERIN (NITROSTAT) 0.4 MG SL tablet Place 1 tablet (0.4 mg total) every 5 (five) minutes as needed under the tongue for chest pain.  . nitroGLYCERIN (NITROSTAT) 0.4 MG SL tablet DISSOLVE ONE TABLET UNDER THE TONGUE EVERY 5 MINUTES AS NEEDED FOR CHEST PAIN.  DO NOT EXCEED A TOTAL OF 3 DOSES IN 15 MINUTES  . ONETOUCH VERIO test strip   . potassium chloride SA (K-DUR,KLOR-CON) 20 MEQ tablet TAKE 1 TABLET BY MOUTH  EVERY DAY  . torsemide (DEMADEX) 20 MG tablet TAKE 2 TABLETS BY MOUTH  DAILY  . valsartan (DIOVAN) 80 MG tablet TAKE 1 TABLET BY MOUTH   DAILY  . Vitamin D, Ergocalciferol, (DRISDOL) 50000 units CAPS capsule Take 1 capsule (50,000 Units total) by mouth once a week.   No facility-administered encounter medications on file as of 04/25/2017.     Allergies (verified) Lisinopril   History: Past Medical History:  Diagnosis Date  . CAD (coronary artery disease)    a. s/p NSTEMI 4/11 => s/p CABG (L-LAD, S-OM2, S-PDA/PL);  b.  ETT-Myoview 6/14:  normal study, no ischemia, EF 67%  . Carotid stenosis    Carotid U/S 3/15:  RICA 1-76%, LICA 16-07%; right vertebral flow retrograde suggestive of steel-consider PV consult  . COPD (chronic obstructive pulmonary disease) ([place])   . HLD (hyperlipidemia)   . HTN (hypertension)   . Hx of echocardiogram    Echo 6/14: Mod LVH, focal basal hypertrophy, EF 50-55%, mild AS (mean 17 mmHg) and mild AI, mild to mod LAE, mild RAE  . Leg edema   . Obesity   . Renal insufficiency   . Subclavian artery stenosis, right ([place])    based upon carotid U/S done 11/2012   Past Surgical History:  Procedure Laterality Date  . CORONARY ARTERY BYPASS GRAFT     2011, LIMA to LAD coronary artery, SVG to OM2 branch of lect circumflex coronary artery, and a sequential SVG to psot descening to posterolateral branches to RCA  . endoscopic vein harvesting     right leg   . HERNIA REPAIR    .  TONSILLECTOMY     Family History  Problem Relation Age of Onset  . Heart disease Father   . Heart attack Father   . Mental illness Mother   . Mental illness Sister   . Diabetes Brother   . Depression Maternal Aunt    Social History   Occupational History  . Occupation: Retired  Tobacco Use  . Smoking status: Former Smoker    Last attempt to quit: 07/11/2009    Years since quitting: 7.7  . Smokeless tobacco: Never Used  . Tobacco comment: smoked about 10 cig/day; used to smoke 2 ppd for many years   Substance and Sexual Activity  . Alcohol use: No    Alcohol/week: 0.0 oz  . Drug use: No  . Sexual activity: No     Tobacco Counseling Counseling given: Not Answered Comment: smoked about 10 cig/day; used to smoke 2 ppd for many years    Activities of Daily Living In your present state of health, do you have any difficulty performing the following activities: 04/25/2017 01/18/2017  Hearing? N N  Vision? N N  Difficulty concentrating or making decisions? Y N  Comment sometimes has trouble remembering what he's talking about -  Walking or climbing stairs? Y N  Comment dyspnea with exertion -  Dressing or bathing? N N  Doing errands, shopping? N N  Preparing Food and eating ? N -  Using the Toilet? N -  In the past six months, have you accidently leaked urine? N -  Do you have problems with loss of bowel control? N -  Managing your Medications? N -  Managing your Finances? N -  Housekeeping or managing your Housekeeping? N -  Some recent data might be hidden    Immunizations and Health Maintenance Immunization History  Administered Date(s) Administered  . Influenza, High Dose Seasonal PF 03/31/2014, 04/24/2015, 03/29/2017  . Influenza-Unspecified 02/03/2013, 03/31/2016, 03/29/2017  . Pneumococcal Conjugate-13 09/16/2014  . Pneumococcal Polysaccharide-23 08/06/2012   There are no preventive care reminders to display for this patient.  Patient Care Team: Sharion Balloon, FNP as PCP - General (Nurse Practitioner) Minus Breeding, MD as Consulting Physician (Cardiology)  ED visit on 01/16/17 for acute gout and ED to hospital admission on 01/18/17 for left leg cellulitis. No surgeries this past year.   Assessment:   This is a routine wellness examination for [facility].   Hearing/Vision screen No deficits noted during visit. Eye exam is current. Has had left cataract removed.   Dietary issues and exercise activities discussed: Current Exercise Habits: The patient does not participate in regular exercise at present, Exercise limited by: respiratory conditions(s);cardiac condition(s)    Diet: Typically only eats supper. Has breakfast on occasion. Today he had an egg sandwich for breakfast. No problems with access to food. Drinks 2 to 3 diet sodas a day.   Goals Increase physical activity slowly. Goal of walking 10 min a day at first.  Decrease soda intake. Increase water intake.  Plan 3 meals a day  Depression Screen PHQ 2/9 Scores 04/25/2017 04/23/2017 02/13/2017 12/19/2016  PHQ - 2 Score 0 0 0 0    Fall Risk Fall Risk  04/25/2017 04/23/2017 02/13/2017 12/19/2016 09/13/2016  Falls in the past year? No No No No No  Number falls in past yr: - - - - -  Injury with Fall? - - - - -  Risk for fall due to : - - - - -  Risk for fall due to: Comment - - - - -  Follow up - - - - -    Cognitive Function: MMSE - Mini Mental State Exam 02/23/2016 09/17/2014  Orientation to time 5 5  Orientation to Place 5 5  Registration 3 3  Attention/ Calculation 4 5  Recall 2 1  Language- name 2 objects 2 2  Language- repeat 1 1  Language- follow 3 step command 2 3  Language- read & follow direction 1 1  Write a sentence 1 1  Copy design 1 1  Total score 27 28        Screening Tests Health Maintenance  Topic Date Due  . COLON CANCER SCREENING ANNUAL FOBT  10/23/2017 (Originally 08/24/2016)  . OPHTHALMOLOGY EXAM  06/15/2017  . HEMOGLOBIN A1C  10/21/2017  . FOOT EXAM  12/19/2017  . TETANUS/TDAP  10/04/2019  . INFLUENZA VACCINE  Completed  . Hepatitis C Screening  Completed  . PNA vac Low Risk Adult  Completed        Plan:  Keep f/u with PCP Decrease soda intake. Increase water intake.  Plan 3 meals a day.  Increase physical activity as tolerated. Aim for walking 10 min a day at first.    I have personally reviewed and noted the following in the patient's chart:   . Medical and social history . Use of alcohol, tobacco or illicit drugs  . Current medications and supplements . Functional ability and status . Nutritional status . Physical activity . Advanced  directives . List of other physicians . Hospitalizations, surgeries, and ER visits in previous 12 months . Vitals . Screenings to include cognitive, depression, and falls . Referrals and appointments  In addition, I have reviewed and discussed with patient certain preventive protocols, quality metrics, and best practice recommendations. A written personalized care plan for preventive services as well as general preventive health recommendations were provided to patient.     Chong Sicilian, RN   04/25/2017    I have reviewed and agree with the above AWV documentation.   Evelina Dun, FNP

## 2017-04-25 NOTE — Patient Instructions (Addendum)
  Matthew May ,  Thank you for taking time to come for your Medicare Wellness Visit. I appreciate your ongoing commitment to your health goals. Please review the following plan we discussed and let me know if I can assist you in the future.   These are the goals we discussed: Decrease soda intake. Increase water intake. Plan three meals a day Try to walk for 10 minutes a day  This is a list of the screening recommended for you and due dates:  Health Maintenance  Topic Date Due  . Stool Blood Test  10/23/2017*  . Eye exam for diabetics  06/15/2017  . Hemoglobin A1C  10/21/2017  . Complete foot exam   12/19/2017  . Tetanus Vaccine  10/04/2019  . Flu Shot  Completed  .  Hepatitis C: One time screening is recommended by Center for Disease Control  (CDC) for  adults born from [birth year] through 1963.   Completed  . Pneumonia vaccines  Completed  *Topic was postponed. The date shown is not the original due date.

## 2017-04-26 LAB — FECAL OCCULT BLOOD, IMMUNOCHEMICAL: Fecal Occult Bld: NEGATIVE

## 2017-05-01 ENCOUNTER — Telehealth: Payer: Self-pay | Admitting: Cardiology

## 2017-05-01 NOTE — Telephone Encounter (Signed)
msg left for patient to call. 

## 2017-05-01 NOTE — Telephone Encounter (Signed)
Pt called to see when next appt with Hochrein was, I told him he is due in May 2019, he says he comes every 3 months. I explained at last ov in May 2018 he was to come back in one year. He states he can't see him before then, I asked what problem he was having and he said none he is just suppose to be seen every 3 months and it's been over that, I explained again that he was not due this time for a year, due in May. He says he needs to see Dr. Percival Spanish in [place], asked agin if he was having any problems, now he says some SOB and some chest pain at night about 2-3 days ago-constant, did not take Nitro. He would like to know when he should take the [facility], and if he needs to make a sooner appt. Pls advise

## 2017-05-02 ENCOUNTER — Ambulatory Visit (HOSPITAL_COMMUNITY)
Admission: RE | Admit: 2017-05-02 | Discharge: 2017-05-02 | Disposition: A | Discharge status: Home / Self Care | Payer: Medicare Other | Source: Ambulatory Visit | Attending: Cardiology | Admitting: Cardiology

## 2017-05-02 DIAGNOSIS — I080 Rheumatic disorders of both mitral and aortic valves: Secondary | ICD-10-CM | POA: Diagnosis not present

## 2017-05-02 DIAGNOSIS — E039 Hypothyroidism, unspecified: Secondary | ICD-10-CM | POA: Insufficient documentation

## 2017-05-02 DIAGNOSIS — E119 Type 2 diabetes mellitus without complications: Secondary | ICD-10-CM | POA: Insufficient documentation

## 2017-05-02 DIAGNOSIS — E785 Hyperlipidemia, unspecified: Secondary | ICD-10-CM | POA: Diagnosis not present

## 2017-05-02 DIAGNOSIS — I429 Cardiomyopathy, unspecified: Secondary | ICD-10-CM | POA: Diagnosis not present

## 2017-05-02 DIAGNOSIS — I10 Essential (primary) hypertension: Secondary | ICD-10-CM | POA: Diagnosis not present

## 2017-05-02 DIAGNOSIS — I6523 Occlusion and stenosis of bilateral carotid arteries: Secondary | ICD-10-CM | POA: Insufficient documentation

## 2017-05-02 DIAGNOSIS — I350 Nonrheumatic aortic (valve) stenosis: Secondary | ICD-10-CM | POA: Diagnosis not present

## 2017-05-02 LAB — ECHOCARDIOGRAM COMPLETE
AO mean calculated velocity dopler: 239 cm/s
AV Area VTI index: 0.47 cm2/m2
AV Area VTI: 0.98 cm2
AV Area mean vel: 1.07 cm2
AV Mean grad: 28 mmHg
AV Peak grad: 54 mmHg
AV VEL mean LVOT/AV: 0.28
AV area mean vel ind: 0.47 cm2/m2
AV peak Index: 0.43
AV pk vel: 367 cm/s
AV vel: 1.08
Ao pk vel: 0.26 m/s
E decel time: 232 msec
E/e' ratio: 13.3
FS: 50 % — AB (ref 28–44)
IVS/LV PW RATIO, ED: 1.03
LA ID, A-P, ES: 49 mm
LA diam end sys: 49 mm
LA diam index: 2.14 cm/m2
LA vol A4C: 54.5 ml
LA vol index: 19.2 mL/m2
LA vol: 43.8 mL
LV E/e' medial: 13.3
LV E/e'average: 13.3
LV PW d: 12.7 mm — AB (ref 0.6–1.1)
LV dias vol index: 55 mL/m2
LV dias vol: 126 mL (ref 62–150)
LV e' LATERAL: 10.3 cm/s
LV sys vol index: 18 mL/m2
LV sys vol: 41 mL (ref 21–61)
LVOT SV: 95 mL
LVOT VTI: 25 cm
LVOT area: 3.8 cm2
LVOT diameter: 22 mm
LVOT peak VTI: 0.28 cm
LVOT peak grad rest: 4 mmHg
LVOT peak vel: 95 cm/s
Lateral S' vel: 9.25 cm/s
MV Dec: 232
MV Peak grad: 8 mmHg
MV pk A vel: 127 m/s
MV pk E vel: 137 m/s
P 1/2 time: 685 ms
Simpson's disk: 67
Stroke v: 85 ml
TAPSE: 14.7 mm
TDI e' lateral: 10.3
TDI e' medial: 7.94
VTI: 88.3 cm
Valve area index: 0.47
Valve area: 1.08 cm2

## 2017-05-02 NOTE — Progress Notes (Signed)
*  PRELIMINARY RESULTS* Echocardiogram 2D Echocardiogram has been performed.  Samuel Germany 05/02/2017, 3:09 PM

## 2017-05-21 ENCOUNTER — Encounter: Payer: Self-pay | Admitting: *Deleted

## 2017-05-24 DIAGNOSIS — Z0289 Encounter for other administrative examinations: Secondary | ICD-10-CM

## 2017-06-03 ENCOUNTER — Other Ambulatory Visit: Payer: Self-pay | Admitting: Family

## 2017-06-03 DIAGNOSIS — E119 Type 2 diabetes mellitus without complications: Secondary | ICD-10-CM

## 2017-06-15 ENCOUNTER — Emergency Department (HOSPITAL_COMMUNITY)
Admission: EM | Admit: 2017-06-15 | Discharge: 2017-06-15 | Disposition: A | Discharge status: Home / Self Care | Payer: Medicare Other | Attending: Emergency Medicine | Admitting: Emergency Medicine

## 2017-06-15 ENCOUNTER — Encounter (HOSPITAL_COMMUNITY): Payer: Self-pay | Admitting: Emergency Medicine

## 2017-06-15 ENCOUNTER — Other Ambulatory Visit: Payer: Self-pay

## 2017-06-15 DIAGNOSIS — Z7984 Long term (current) use of oral hypoglycemic drugs: Secondary | ICD-10-CM | POA: Insufficient documentation

## 2017-06-15 DIAGNOSIS — E1159 Type 2 diabetes mellitus with other circulatory complications: Secondary | ICD-10-CM | POA: Insufficient documentation

## 2017-06-15 DIAGNOSIS — J449 Chronic obstructive pulmonary disease, unspecified: Secondary | ICD-10-CM | POA: Diagnosis not present

## 2017-06-15 DIAGNOSIS — R109 Unspecified abdominal pain: Secondary | ICD-10-CM | POA: Insufficient documentation

## 2017-06-15 DIAGNOSIS — Z87891 Personal history of nicotine dependence: Secondary | ICD-10-CM | POA: Insufficient documentation

## 2017-06-15 DIAGNOSIS — E039 Hypothyroidism, unspecified: Secondary | ICD-10-CM | POA: Diagnosis not present

## 2017-06-15 DIAGNOSIS — I10 Essential (primary) hypertension: Secondary | ICD-10-CM | POA: Diagnosis not present

## 2017-06-15 DIAGNOSIS — Z951 Presence of aortocoronary bypass graft: Secondary | ICD-10-CM | POA: Diagnosis not present

## 2017-06-15 DIAGNOSIS — Z7982 Long term (current) use of aspirin: Secondary | ICD-10-CM | POA: Diagnosis not present

## 2017-06-15 DIAGNOSIS — Z79899 Other long term (current) drug therapy: Secondary | ICD-10-CM | POA: Diagnosis not present

## 2017-06-15 DIAGNOSIS — M545 Low back pain: Secondary | ICD-10-CM | POA: Diagnosis present

## 2017-06-15 DIAGNOSIS — M5432 Sciatica, left side: Secondary | ICD-10-CM | POA: Insufficient documentation

## 2017-06-15 DIAGNOSIS — I2510 Atherosclerotic heart disease of native coronary artery without angina pectoris: Secondary | ICD-10-CM | POA: Insufficient documentation

## 2017-06-15 DIAGNOSIS — M5442 Lumbago with sciatica, left side: Secondary | ICD-10-CM | POA: Diagnosis not present

## 2017-06-15 LAB — BASIC METABOLIC PANEL
Anion gap: 14 (ref 5–15)
BUN: 21 mg/dL — ABNORMAL HIGH (ref 6–20)
CO2: 25 mmol/L (ref 22–32)
Calcium: 9.3 mg/dL (ref 8.9–10.3)
Chloride: 99 mmol/L — ABNORMAL LOW (ref 101–111)
Creatinine, Ser: 1.1 mg/dL (ref 0.61–1.24)
GFR calc Af Amer: >60 mL/min (ref >60)
GFR calc non Af Amer: >60 mL/min (ref >60)
Glucose, Bld: 138 mg/dL — ABNORMAL HIGH (ref 65–99)
Potassium: 4 mmol/L (ref 3.5–5.1)
Sodium: 138 mmol/L (ref 135–145)

## 2017-06-15 LAB — URINALYSIS, ROUTINE W REFLEX MICROSCOPIC
Bilirubin Urine: NEGATIVE
Glucose, UA: NEGATIVE mg/dL
Hgb urine dipstick: NEGATIVE
Ketones, ur: NEGATIVE mg/dL
Leukocytes, UA: NEGATIVE
Nitrite: NEGATIVE
Protein, ur: NEGATIVE mg/dL
Specific Gravity, Urine: 1.018 (ref 1.005–1.030)
pH: 5 (ref 5.0–8.0)

## 2017-06-15 LAB — CBC WITH DIFFERENTIAL/PLATELET
Basophils Absolute: 0 10*3/uL (ref 0.0–0.1)
Basophils Relative: 0 %
Eosinophils Absolute: 0.3 10*3/uL (ref 0.0–0.7)
Eosinophils Relative: 3 %
HCT: 41.8 % (ref 39.0–52.0)
Hemoglobin: 13.9 g/dL (ref 13.0–17.0)
Lymphocytes Relative: 26 %
Lymphs Abs: 2.3 10*3/uL (ref 0.7–4.0)
MCH: 29.7 pg (ref 26.0–34.0)
MCHC: 33.3 g/dL (ref 30.0–36.0)
MCV: 89.3 fL (ref 78.0–100.0)
Monocytes Absolute: 0.5 10*3/uL (ref 0.1–1.0)
Monocytes Relative: 6 %
Neutro Abs: 5.6 10*3/uL (ref 1.7–7.7)
Neutrophils Relative %: 65 %
Platelets: 168 10*3/uL (ref 150–400)
RBC: 4.68 MIL/uL (ref 4.22–5.81)
RDW: 13.1 % (ref 11.5–15.5)
WBC: 8.6 10*3/uL (ref 4.0–10.5)

## 2017-06-15 MED ORDER — PREDNISONE 20 MG PO TABS: 40.0000 mg | ORAL_TABLET | Freq: Every day | ORAL | 0 refills | Status: DC

## 2017-06-15 MED ORDER — PREDNISONE 50 MG PO TABS
60.0000 mg | ORAL_TABLET | ORAL | Status: AC
  Administered 2017-06-15: 60 mg via ORAL
  Filled 2017-06-15: qty 1

## 2017-06-15 MED ORDER — KETOROLAC TROMETHAMINE 30 MG/ML IJ SOLN
15.0000 mg | Freq: Once | INTRAMUSCULAR | Status: AC
  Administered 2017-06-15: 15 mg via INTRAVENOUS
  Filled 2017-06-15: qty 1

## 2017-06-15 MED ORDER — LIDOCAINE 5 % EX PTCH: 1.0000 | MEDICATED_PATCH | CUTANEOUS | 0 refills | Status: DC

## 2017-06-15 NOTE — ED Triage Notes (Signed)
Left side lower back pain x 4 days. Has not taken anything for pain. States pain started radiating down left leg today

## 2017-06-15 NOTE — ED Provider Notes (Signed)
[facility] EMERGENCY DEPARTMENT Provider Note   CSN: [id number] Arrival date & time: 06/15/17  1616     History   Chief Complaint Chief Complaint  Patient presents with  . Back Pain    HPI Matthew May is a 72 y.o. male.  HPI Patient presents with concern of left low back and left flank pain. Onset was about 3 days ago. Since onset pain is been persistent, sore, severe, worse with ambulation. Pain radiates down the left lateral thigh. No other back pain, no history of back pain, nor back surgery. No concurrent dysuria, hematuria, fever, chills, chest pain. Patient does have history of multiple medical issues including hypertension, CAD, status post bypass, with graft harvest from the right leg. He has a recent episode of cellulitis in the left leg, but notes that this has resolved entirely.  \Since this episode of pain began he has taken no new medication for pain relief. Past Medical History:  Diagnosis Date  . CAD (coronary artery disease)    a. s/p NSTEMI 4/11 => s/p CABG (L-LAD, S-OM2, S-PDA/PL);  b.  ETT-Myoview 6/14:  normal study, no ischemia, EF 67%  . Carotid stenosis    Carotid U/S 4/78:  RICA 2-95%, LICA 62-13%; right vertebral flow retrograde suggestive of steel-consider PV consult  . COPD (chronic obstructive pulmonary disease) ([place])   . HLD (hyperlipidemia)   . HTN (hypertension)   . Hx of echocardiogram    Echo 6/14: Mod LVH, focal basal hypertrophy, EF 50-55%, mild AS (mean 17 mmHg) and mild AI, mild to mod LAE, mild RAE  . Leg edema   . Obesity   . Renal insufficiency   . Subclavian artery stenosis, right ([place])    based upon carotid U/S done 11/2012    Patient Active Problem List   Diagnosis Date Noted  . Cellulitis and abscess of left leg 01/18/2017  . Bilateral carotid artery stenosis 11/01/2016  . Aortic valve stenosis 11/01/2016  . Morbid obesity ([place]) 11/29/2015  . Trigeminy 05/23/2013  . Subclavian artery stenosis, right ([place])  12/03/2012  . Diabetes mellitus, type 2 ([place]) 08/26/2012  . Vitamin D deficiency 08/26/2012  . Hypothyroidism 07/12/2011  . Hyperlipidemia associated with type 2 diabetes mellitus ([place]) 11/10/2009  . CORONARY ATHEROSCLEROSIS NATIVE CORONARY ARTERY 11/10/2009  . Hypertension associated with diabetes ([place]) 10/26/2009    Past Surgical History:  Procedure Laterality Date  . CORONARY ARTERY BYPASS GRAFT     2011, LIMA to LAD coronary artery, SVG to OM2 branch of lect circumflex coronary artery, and a sequential SVG to psot descening to posterolateral branches to RCA  . endoscopic vein harvesting     right leg   . HERNIA REPAIR    . TONSILLECTOMY         Home Medications    Prior to Admission medications   Medication Sig Start Date End Date Taking? Authorizing Provider  albuterol (PROVENTIL HFA;VENTOLIN HFA) 108 (90 Base) MCG/ACT inhaler Inhale 2 puffs into the lungs every 6 (six) hours as needed for wheezing or shortness of breath. 02/23/16   Evelina Dun A, FNP  aspirin EC 81 MG tablet Take 81 mg by mouth daily.    [provider]  atorvastatin (LIPITOR) 40 MG tablet TAKE 1 TABLET BY MOUTH  DAILY 06/06/17   Evelina Dun A, FNP  levothyroxine (SYNTHROID, LEVOTHROID) 125 MCG tablet TAKE 1 TABLET BY MOUTH  DAILY 01/09/17   Evelina Dun A, FNP  metFORMIN (GLUCOPHAGE) 1000 MG tablet TAKE 1 TABLET BY  MOUTH TWO  TIMES DAILY 04/23/17   Evelina Dun A, FNP  nitroGLYCERIN (NITROSTAT) 0.4 MG SL tablet Place 1 tablet (0.4 mg total) every 5 (five) minutes as needed under the tongue for chest pain. 04/23/17 05/15/18  Evelina Dun A, FNP  nitroGLYCERIN (NITROSTAT) 0.4 MG SL tablet DISSOLVE ONE TABLET UNDER THE TONGUE EVERY 5 MINUTES AS NEEDED FOR CHEST PAIN.  DO NOT EXCEED A TOTAL OF 3 DOSES IN 15 MINUTES 04/24/17   Sharion Balloon, FNP  [facility] DELICA LANCETS 95M MISC USE ONE DAILY 06/06/17   Sharion Balloon, FNP  [facility] VERIO test strip  03/04/17   [provider]    potassium chloride SA (K-DUR,KLOR-CON) 20 MEQ tablet TAKE 1 TABLET BY MOUTH  EVERY DAY 01/09/17   Minus Breeding, MD  torsemide (DEMADEX) 20 MG tablet TAKE 2 TABLETS BY MOUTH  DAILY 06/06/17   Evelina Dun A, FNP  valsartan (DIOVAN) 80 MG tablet TAKE 1 TABLET BY MOUTH  DAILY 06/06/17   Evelina Dun A, FNP  Vitamin D, Ergocalciferol, (DRISDOL) 50000 units CAPS capsule Take 1 capsule (50,000 Units total) by mouth once a week. 09/08/15   Sharion Balloon, FNP    Family History Family History  Problem Relation Age of Onset  . Heart disease Father   . Heart attack Father   . Mental illness Mother   . Mental illness Sister   . Diabetes Brother   . Depression Maternal Aunt     Social History Social History   Tobacco Use  . Smoking status: Former Smoker    Last attempt to quit: 07/11/2009    Years since quitting: 7.9  . Smokeless tobacco: Never Used  . Tobacco comment: smoked about 10 cig/day; used to smoke 2 ppd for many years   Substance Use Topics  . Alcohol use: No    Alcohol/week: 0.0 oz  . Drug use: No     Allergies   Lisinopril   Review of Systems Review of Systems  Constitutional:       Per HPI, otherwise negative  HENT:       Per HPI, otherwise negative  Respiratory:       Per HPI, otherwise negative  Cardiovascular:       Per HPI, otherwise negative  Gastrointestinal: Negative for vomiting.  Endocrine:       Negative aside from HPI  Genitourinary:       Neg aside from HPI   Musculoskeletal:       Per HPI, otherwise negative  Skin: Negative.   Neurological: Negative for syncope.     Physical Exam Updated Vital Signs BP (!) 121/57 (BP Location: Right Arm)   Pulse 70   Temp (!) 97.3 F (36.3 C) (Oral)   Resp 20   Ht 5' 7.5" (1.715 m)   Wt 104.3 kg (230 lb)   SpO2 96%   BMI 35.49 kg/m   Physical Exam  Constitutional: He is oriented to person, place, and time. He appears well-developed. No distress.  HENT:  Head: Normocephalic and atraumatic.   Eyes: Conjunctivae and EOM are normal.  Cardiovascular: Normal rate and regular rhythm.  Pulmonary/Chest: Effort normal. No stridor. No respiratory distress.  Abdominal: He exhibits no distension.  Musculoskeletal: He exhibits no edema.       Arms: Neurological: He is alert and oriented to person, place, and time.  Skin: Skin is warm and dry.  Psychiatric: He has a normal mood and affect.  Nursing note and vitals reviewed.  ED Treatments / Results  Labs (all labs ordered are listed, but only abnormal results are displayed) Labs Reviewed  BASIC METABOLIC PANEL - Abnormal; Notable for the following components:      Result Value   Chloride 99 (*)    Glucose, Bld 138 (*)    BUN 21 (*)    All other components within normal limits  CBC WITH DIFFERENTIAL/PLATELET  URINALYSIS, ROUTINE W REFLEX MICROSCOPIC     Procedures Procedures (including critical care time)  Medications Ordered in ED Medications  ketorolac (TORADOL) 30 MG/ML injection 15 mg (not administered)     Initial Impression / Assessment and Plan / ED Course  I have reviewed the triage vital signs and the nursing notes.  Pertinent labs & imaging results that were available during my care of the patient were reviewed by me and considered in my medical decision making (see chart for details).     6:47 PM Patient in no distress.  This elderly male presents with new left flank and low back pain, with radiation on the left leg. Patient has no history of sciatica, nor lumbar spine disease, but today's presentation is most consistent with the former.  No evidence for distal neurologic dysfunction, no abdominal pain, low suspicion for aortic pathology or peritonitis given his otherwise denial of complaints. Labs do not suggest renal etiology either. Patient discharged with topical meds, outpatient follow-up.  Final Clinical Impressions(s) / ED Diagnoses  Acute left-sided low back pain   Carmin Muskrat,  MD 06/15/17 [phone]

## 2017-06-18 ENCOUNTER — Ambulatory Visit: Payer: Medicare Other | Admitting: Family

## 2017-06-18 ENCOUNTER — Encounter: Payer: Self-pay | Admitting: Family

## 2017-06-18 VITALS — BP 135/59 | HR 64 | Temp 96.6°F | Ht 67.5 in | Wt 228.0 lb

## 2017-06-18 DIAGNOSIS — A084 Viral intestinal infection, unspecified: Secondary | ICD-10-CM | POA: Diagnosis not present

## 2017-06-18 MED ORDER — ONDANSETRON 4 MG PO TBDP: 4.0000 mg | ORAL_TABLET | Freq: Three times a day (TID) | ORAL | 0 refills | Status: DC | PRN

## 2017-06-18 NOTE — Patient Instructions (Signed)

## 2017-06-18 NOTE — Progress Notes (Signed)
   Subjective:    Patient ID: Matthew May, male    DOB: 1946-04-26, 72 y.o.   MRN: [id number]  Emesis   This is a new problem. The current episode started in the past 7 days (started two days ago). The problem occurs 2 to 4 times per day. The problem has been rapidly worsening. The emesis has an appearance of stomach contents. There has been no fever. Associated symptoms include coughing, dizziness, headaches and sweats. Pertinent negatives include no abdominal pain, chills, diarrhea, fever or myalgias. Risk factors: Recently at ED. He has tried bed rest and increased fluids for the symptoms. The treatment provided mild relief.      Review of Systems  Constitutional: Negative for chills and fever.  Respiratory: Positive for cough.   Gastrointestinal: Positive for vomiting. Negative for abdominal pain and diarrhea.  Musculoskeletal: Negative for myalgias.  Neurological: Positive for dizziness and headaches.  All other systems reviewed and are negative.      Objective:   Physical Exam  Constitutional: He is oriented to person, place, and time. He appears well-developed and well-nourished. No distress.  HENT:  Head: Normocephalic.  Right Ear: External ear normal.  Left Ear: External ear normal.  Mouth/Throat: Posterior oropharyngeal erythema present.  Eyes: Pupils are equal, round, and reactive to light. Right eye exhibits no discharge. Left eye exhibits no discharge.  Neck: Normal range of motion. Neck supple. No thyromegaly present.  Cardiovascular: Normal rate, regular rhythm, normal heart sounds and intact distal pulses.  No murmur heard. Pulmonary/Chest: Effort normal and breath sounds normal. No respiratory distress. He has no wheezes.  Abdominal: Soft. Bowel sounds are normal. He exhibits no distension. There is no tenderness.  Musculoskeletal: Normal range of motion. He exhibits no edema or tenderness.  Neurological: He is alert and oriented to person, place, and time.    Skin: Skin is warm and dry. No rash noted. No erythema.  Psychiatric: He has a normal mood and affect. His behavior is normal. Judgment and thought content normal.  Vitals reviewed.    BP (!) 135/59   Pulse 64   Temp (!) 96.6 F (35.9 C) (Oral)   Ht 5' 7.5" (1.715 m)   Wt 228 lb (103.4 kg)   BMI 35.18 kg/m      Assessment & Plan:  1. Viral gastroenteritis Force fluids Bland diet Zofran as needed If dizziness worsens he will need to go to ED, fall precautions discussed  - ondansetron (ZOFRAN ODT) 4 MG disintegrating tablet; Take 1 tablet (4 mg total) by mouth every 8 (eight) hours as needed for nausea or vomiting.  Dispense: 20 tablet; Refill: 0    Evelina Dun, FNP

## 2017-06-29 ENCOUNTER — Other Ambulatory Visit: Payer: Self-pay | Admitting: Family

## 2017-07-02 NOTE — Telephone Encounter (Signed)
Last Vit D 04/23/17  56.0

## 2017-07-22 ENCOUNTER — Other Ambulatory Visit: Payer: Self-pay | Admitting: Family

## 2017-07-24 ENCOUNTER — Ambulatory Visit: Payer: Medicare Other | Admitting: Family

## 2017-07-25 ENCOUNTER — Encounter: Payer: Self-pay | Admitting: Family

## 2017-07-27 ENCOUNTER — Ambulatory Visit: Payer: Medicare Other | Admitting: Family

## 2017-07-27 ENCOUNTER — Telehealth: Payer: Self-pay | Admitting: Cardiology

## 2017-07-27 ENCOUNTER — Encounter: Payer: Self-pay | Admitting: Family

## 2017-07-27 VITALS — BP 122/67 | HR 84 | Temp 97.3°F | Ht 67.5 in | Wt 234.0 lb

## 2017-07-27 DIAGNOSIS — I350 Nonrheumatic aortic (valve) stenosis: Secondary | ICD-10-CM | POA: Diagnosis not present

## 2017-07-27 DIAGNOSIS — E1169 Type 2 diabetes mellitus with other specified complication: Secondary | ICD-10-CM

## 2017-07-27 DIAGNOSIS — E1165 Type 2 diabetes mellitus with hyperglycemia: Secondary | ICD-10-CM | POA: Diagnosis not present

## 2017-07-27 DIAGNOSIS — I10 Essential (primary) hypertension: Secondary | ICD-10-CM | POA: Diagnosis not present

## 2017-07-27 DIAGNOSIS — E785 Hyperlipidemia, unspecified: Secondary | ICD-10-CM | POA: Diagnosis not present

## 2017-07-27 DIAGNOSIS — I152 Hypertension secondary to endocrine disorders: Secondary | ICD-10-CM

## 2017-07-27 DIAGNOSIS — E1159 Type 2 diabetes mellitus with other circulatory complications: Secondary | ICD-10-CM | POA: Diagnosis not present

## 2017-07-27 DIAGNOSIS — I2510 Atherosclerotic heart disease of native coronary artery without angina pectoris: Secondary | ICD-10-CM | POA: Diagnosis not present

## 2017-07-27 DIAGNOSIS — E038 Other specified hypothyroidism: Secondary | ICD-10-CM

## 2017-07-27 DIAGNOSIS — E559 Vitamin D deficiency, unspecified: Secondary | ICD-10-CM

## 2017-07-27 LAB — BAYER DCA HB A1C WAIVED: HB A1C (BAYER DCA - WAIVED): 7.3 % — ABNORMAL HIGH (ref <7.0)

## 2017-07-27 NOTE — Telephone Encounter (Signed)
New message  Patient has concerns about dizziness that has been happening for weeks.  Patient states he has chest pain from time to time.  STAT if patient feels like he/she is going to faint   1) Are you dizzy now? No  2) Do you feel faint or have you passed out? No  3) Do you have any other symptoms? dizziness  4) Have you checked your HR and BP (record if available)? Patient states he saw PCP recently and BP was good.    Pt c/o of Chest Pain: STAT if CP now or developed within 24 hours  1. Are you having CP right now? NO  2. Are you experiencing any other symptoms (ex. SOB, nausea, vomiting, sweating)? NO  3. How long have you been experiencing CP? Weeks, off and on  4. Is your CP continuous or coming and going? Coming and going  5. Have you taken Nitroglycerin? NO ?

## 2017-07-27 NOTE — Telephone Encounter (Signed)
LMTCB

## 2017-07-27 NOTE — Progress Notes (Signed)
Subjective:    Patient ID: Matthew May, male    DOB: 11-May-1946, 72 y.o.   MRN: [id number]  Pt presents to the office today for chronic follow up. Pt is followed by Cardiologists every 3-6 months for CAD.  Diabetes  He presents for his follow-up diabetic visit. He has type 2 diabetes mellitus. His disease course has been stable. There are no hypoglycemic associated symptoms. Associated symptoms include fatigue. Pertinent negatives for diabetes include no blurred vision, no foot paresthesias and no visual change. Symptoms are stable. Pertinent negatives for diabetic complications include no CVA or peripheral neuropathy. Risk factors for coronary artery disease include diabetes mellitus, dyslipidemia, male sex, obesity and sedentary lifestyle. He is following a generally unhealthy diet. His breakfast blood glucose range is generally 110-130 mg/dl. Eye exam is not current.  Hyperlipidemia  This is a chronic problem. The current episode started more than 1 year ago. The problem is controlled. Recent lipid tests were reviewed and are normal. Exacerbating diseases include obesity. Associated symptoms include leg pain. Pertinent negatives include no shortness of breath. Current antihyperlipidemic treatment includes statins. The current treatment provides moderate improvement of lipids. Risk factors for coronary artery disease include diabetes mellitus, dyslipidemia, family history, male sex, obesity, a sedentary lifestyle and hypertension.  Hypertension  This is a chronic problem. The current episode started more than 1 year ago. The problem has been resolved since onset. The problem is controlled. Associated symptoms include malaise/fatigue and peripheral edema. Pertinent negatives include no blurred vision or shortness of breath. Risk factors for coronary artery disease include diabetes mellitus, dyslipidemia, obesity and male gender. The current treatment provides moderate improvement. There is no  history of CVA. Identifiable causes of hypertension include a thyroid problem.  Thyroid Problem  Presents for follow-up visit. Symptoms include fatigue. Patient reports no constipation, depressed mood, diarrhea or visual change. The symptoms have been stable. His past medical history is significant for hyperlipidemia.  Leg Pain   There was no injury mechanism. The pain is present in the left leg. The quality of the pain is described as aching. The pain is at a severity of 9/10. The pain is moderate. The pain has been intermittent since onset. The symptoms are aggravated by movement. He has tried NSAIDs for the symptoms. The treatment provided mild relief.      Review of Systems  Constitutional: Positive for fatigue and malaise/fatigue.  Eyes: Negative for blurred vision.  Respiratory: Negative for shortness of breath.   Gastrointestinal: Negative for constipation and diarrhea.  All other systems reviewed and are negative.      Objective:   Physical Exam  Constitutional: He is oriented to person, place, and time. He appears well-developed and well-nourished. No distress.  HENT:  Head: Normocephalic.  Right Ear: External ear normal.  Left Ear: External ear normal.  Nose: Nose normal.  Mouth/Throat: Oropharynx is clear and moist.  Eyes: Pupils are equal, round, and reactive to light. Right eye exhibits no discharge. Left eye exhibits no discharge.  Neck: Normal range of motion. Neck supple. No thyromegaly present.  Cardiovascular: Normal rate, regular rhythm and intact distal pulses.  Murmur heard. Pulmonary/Chest: Effort normal. No respiratory distress. He has decreased breath sounds. He has no wheezes.  Abdominal: Soft. Bowel sounds are normal. He exhibits no distension. There is no tenderness.  Musculoskeletal: Normal range of motion. He exhibits edema (trace BLE). He exhibits no tenderness.  Neurological: He is alert and oriented to person, place, and time.  Skin: Skin  is warm and  dry. No rash noted. No erythema.  Psychiatric: He has a normal mood and affect. His behavior is normal. Judgment and thought content normal.  Vitals reviewed.    BP 122/67   Pulse 84   Temp (!) 97.3 F (36.3 C) (Oral)   Ht 5' 7.5" (1.715 m)   Wt 234 lb (106.1 kg)   BMI 36.11 kg/m      Assessment & Plan:  1. Hypertension associated with diabetes ([place]) - CMP14+EGFR - Ambulatory referral to Cardiology  2. Hyperlipidemia associated with type 2 diabetes mellitus ([facility]) - CMP14+EGFR - Lipid panel  3. Other specified hypothyroidism  - CMP14+EGFR - TSH  4. Type 2 diabetes mellitus with hyperglycemia, without long-term current use of insulin ([facility]) - Bayer DCA Hb A1c Waived - CMP14+EGFR  5. Morbid obesity ([facility]) - CMP14+EGFR  6. Vitamin D deficiency - CMP14+EGFR - VITAMIN D 25 Hydroxy (Vit-D Deficiency, Fractures)  7. Atherosclerosis of native coronary artery of native heart without angina pectoris  - CMP14+EGFR - Ambulatory referral to Cardiology  8. Aortic valve stenosis, etiology of cardiac valve disease unspecified - CMP14+EGFR - Ambulatory referral to Cardiology   Continue all meds Labs pending Health Maintenance reviewed Diet and exercise encouraged RTO 3 months   Evelina Dun, FNP

## 2017-07-27 NOTE — Patient Instructions (Signed)
Sciatica Sciatica is pain, numbness, weakness, or tingling along the path of the sciatic nerve. The sciatic nerve starts in the lower back and runs down the back of each leg. The nerve controls the muscles in the lower leg and in the back of the knee. It also provides feeling (sensation) to the back of the thigh, the lower leg, and the sole of the foot. Sciatica is a symptom of another medical condition that pinches or puts pressure on the sciatic nerve. Generally, sciatica only affects one side of the body. Sciatica usually goes away on its own or with treatment. In some cases, sciatica may keep coming back (recur). What are the causes? This condition is caused by pressure on the sciatic nerve, or pinching of the sciatic nerve. This may be the result of:  A disk in between the bones of the spine (vertebrae) bulging out too far (herniated disk).  Age-related changes in the spinal disks (degenerative disk disease).  A pain disorder that affects a muscle in the buttock (piriformis syndrome).  Extra bone growth (bone spur) near the sciatic nerve.  An injury or break (fracture) of the pelvis.  Pregnancy.  Tumor (rare).  What increases the risk? The following factors may make you more likely to develop this condition:  Playing sports that place pressure or stress on the spine, such as football or weight lifting.  Having poor strength and flexibility.  A history of back injury.  A history of back surgery.  Sitting for long periods of time.  Doing activities that involve repetitive bending or lifting.  Obesity.  What are the signs or symptoms? Symptoms can vary from mild to very severe, and they may include:  Any of these problems in the lower back, leg, hip, or buttock: ? Mild tingling or dull aches. ? Burning sensations. ? Sharp pains.  Numbness in the back of the calf or the sole of the foot.  Leg weakness.  Severe back pain that makes movement difficult.  These  symptoms may get worse when you cough, sneeze, or laugh, or when you sit or stand for long periods of time. Being overweight may also make symptoms worse. In some cases, symptoms may recur over time. How is this diagnosed? This condition may be diagnosed based on:  Your symptoms.  A physical exam. Your health care provider may ask you to do certain movements to check whether those movements trigger your symptoms.  You may have tests, including: ? Blood tests. ? X-rays. ? MRI. ? CT scan.  How is this treated? In many cases, this condition improves on its own, without any treatment. However, treatment may include:  Reducing or modifying physical activity during periods of pain.  Exercising and stretching to strengthen your abdomen and improve the flexibility of your spine.  Icing and applying heat to the affected area.  Medicines that help: ? To relieve pain and swelling. ? To relax your muscles.  Injections of medicines that help to relieve pain, irritation, and inflammation around the sciatic nerve (steroids).  Surgery.  Follow these instructions at home: Medicines  Take over-the-counter and prescription medicines only as told by your health care provider.  Do not drive or operate heavy machinery while taking prescription pain medicine. Managing pain  If directed, apply ice to the affected area. ? Put ice in a plastic bag. ? Place a towel between your skin and the bag. ? Leave the ice on for 20 minutes, 2-3 times a day.  After icing, apply   heat to the affected area before you exercise or as often as told by your health care provider. Use the heat source that your health care provider recommends, such as a moist heat pack or a heating pad. ? Place a towel between your skin and the heat source. ? Leave the heat on for 20-30 minutes. ? Remove the heat if your skin turns bright red. This is especially important if you are unable to feel pain, heat, or cold. You may have a  greater risk of getting burned. Activity  Return to your normal activities as told by your health care provider. Ask your health care provider what activities are safe for you. ? Avoid activities that make your symptoms worse.  Take brief periods of rest throughout the day. Resting in a lying or standing position is usually better than sitting to rest. ? When you rest for longer periods, mix in some mild activity or stretching between periods of rest. This will help to prevent stiffness and pain. ? Avoid sitting for long periods of time without moving. Get up and move around at least one time each hour.  Exercise and stretch regularly, as told by your health care provider.  Do not lift anything that is heavier than 10 lb (4.5 kg) while you have symptoms of sciatica. When you do not have symptoms, you should still avoid heavy lifting, especially repetitive heavy lifting.  When you lift objects, always use proper lifting technique, which includes: ? Bending your knees. ? Keeping the load close to your body. ? Avoiding twisting. General instructions  Use good posture. ? Avoid leaning forward while sitting. ? Avoid hunching over while standing.  Maintain a healthy weight. Excess weight puts extra stress on your back and makes it difficult to maintain good posture.  Wear supportive, comfortable shoes. Avoid wearing high heels.  Avoid sleeping on a mattress that is too soft or too hard. A mattress that is firm enough to support your back when you sleep may help to reduce your pain.  Keep all follow-up visits as told by your health care provider. This is important. Contact a health care provider if:  You have pain that wakes you up when you are sleeping.  You have pain that gets worse when you lie down.  Your pain is worse than you have experienced in the past.  Your pain lasts longer than 4 weeks.  You experience unexplained weight loss. Get help right away if:  You lose control  of your bowel or bladder (incontinence).  You have: ? Weakness in your lower back, pelvis, buttocks, or legs that gets worse. ? Redness or swelling of your back. ? A burning sensation when you urinate. This information is not intended to replace advice given to you by your health care provider. Make sure you discuss any questions you have with your health care provider. Document Released: 05/16/2001 Document Revised: 10/26/2015 Document Reviewed: 01/29/2015 Elsevier Interactive Patient Education  2018 [employer]  

## 2017-07-28 LAB — CMP14+EGFR
ALT: 31 IU/L (ref 0–44)
AST: 23 IU/L (ref 0–40)
Albumin/Globulin Ratio: 1.6 (ref 1.2–2.2)
Albumin: 4.6 g/dL (ref 3.6–4.8)
Alkaline Phosphatase: 78 IU/L (ref 39–117)
BUN/Creatinine Ratio: 20 (ref 10–24)
BUN: 18 mg/dL (ref 8–27)
Bilirubin Total: 0.5 mg/dL (ref 0.0–1.2)
CO2: 26 mmol/L (ref 20–29)
Calcium: 9.2 mg/dL (ref 8.6–10.2)
Chloride: 98 mmol/L (ref 96–106)
Creatinine, Ser: 0.88 mg/dL (ref 0.76–1.27)
GFR calc Af Amer: 101 mL/min/{1.73_m2} (ref >59)
GFR calc non Af Amer: 88 mL/min/{1.73_m2} (ref >59)
Globulin, Total: 2.8 g/dL (ref 1.5–4.5)
Glucose: 182 mg/dL — ABNORMAL HIGH (ref 65–99)
Potassium: 4.2 mmol/L (ref 3.5–5.2)
Sodium: 141 mmol/L (ref 134–144)
Total Protein: 7.4 g/dL (ref 6.0–8.5)

## 2017-07-28 LAB — LIPID PANEL
Chol/HDL Ratio: 3.9 ratio (ref 0.0–5.0)
Cholesterol, Total: 133 mg/dL (ref 100–199)
HDL: 34 mg/dL — ABNORMAL LOW (ref >39)
LDL Calculated: 67 mg/dL (ref 0–99)
Triglycerides: 162 mg/dL — ABNORMAL HIGH (ref 0–149)
VLDL Cholesterol Cal: 32 mg/dL (ref 5–40)

## 2017-07-28 LAB — TSH: TSH: 2.71 u[IU]/mL (ref 0.450–4.500)

## 2017-07-28 LAB — VITAMIN D 25 HYDROXY (VIT D DEFICIENCY, FRACTURES): Vit D, 25-Hydroxy: 53.6 ng/mL (ref 30.0–100.0)

## 2017-07-30 NOTE — Telephone Encounter (Signed)
Unable to reach pt or leave a message  

## 2017-08-01 NOTE — Telephone Encounter (Signed)
Left message with man answering to have patient return call

## 2017-08-01 NOTE — Telephone Encounter (Signed)
Patient aware of MD advice. He voiced understanding. No further assistance needed at this time.

## 2017-08-01 NOTE — Telephone Encounter (Signed)
New message ° °Pt verbalized that he is returning call for RN °

## 2017-08-01 NOTE — Telephone Encounter (Signed)
Patient of Dr. Percival Spanish who complains of dizziness for a few weeks and occasional chest pain. He reports pain under his right shoulder blade and goes across his back - described like a "crick in his neck" - symptoms for more two weeks. This pain is constant. The pain is sometimes relieved when he moves his right arm around. He is concerned about the artery is his shoulder - per chart review ?subclavian artery steal in 2014 and was evaluated by Dr. Fletcher Anon July 2014. He reports dizziness on occasion, such as when he is bending over to pick things up. He has not been "falling around" and he has not felt like he would pass out.   He saw PCP on 07/27/17 - in epic.   Will route to MD to review and advise what follow up is needed. He is scheduled to see MD in April in [place]

## 2017-08-01 NOTE — Telephone Encounter (Signed)
LMTCB

## 2017-08-01 NOTE — Telephone Encounter (Signed)
His subclavian should not be causing those symptoms.  I would suggest that he start with follow up with Sharion Balloon, FNP.  She can move his appt up if necessary.

## 2017-08-16 ENCOUNTER — Telehealth: Payer: Self-pay | Admitting: Cardiology

## 2017-08-16 NOTE — Telephone Encounter (Signed)
Closed Encounter  °

## 2017-09-05 ENCOUNTER — Ambulatory Visit: Payer: Medicare Other | Admitting: Cardiology

## 2017-09-21 ENCOUNTER — Other Ambulatory Visit: Payer: Self-pay | Admitting: Family

## 2017-09-24 NOTE — Telephone Encounter (Signed)
Last Vit D 07/27/17  53.6 

## 2017-10-26 ENCOUNTER — Other Ambulatory Visit: Payer: Self-pay | Admitting: Cardiology

## 2017-10-26 ENCOUNTER — Other Ambulatory Visit: Payer: Self-pay | Admitting: Family

## 2017-10-26 NOTE — Telephone Encounter (Signed)
Rx request sent to pharmacy.  

## 2017-10-29 NOTE — Progress Notes (Deleted)
HPI The patient returns for followup of his known coronary disease.  He has had an extensive workup in 2014 with a nuclear study and echo.   Since I last saw him ***   he has done well.  He already is harvesting squash from his garden.  The patient denies any new symptoms such as chest discomfort, neck or arm discomfort. There has been no new shortness of breath, PND or orthopnea. There have been no reported palpitations, presyncope or syncope.  He does have some mild left leg pain with ambulation at times but this is not severe.  Allergies  Allergen Reactions  . Lisinopril Cough    Current Outpatient Medications  Medication Sig Dispense Refill  . aspirin EC 81 MG tablet Take 81 mg by mouth daily.    Marland Kitchen atorvastatin (LIPITOR) 40 MG tablet TAKE 1 TABLET BY MOUTH  DAILY 90 tablet 0  . levothyroxine (SYNTHROID, LEVOTHROID) 125 MCG tablet TAKE 1 TABLET BY MOUTH  DAILY 90 tablet 3  . metFORMIN (GLUCOPHAGE) 1000 MG tablet TAKE 1 TABLET BY MOUTH TWO  TIMES DAILY 180 tablet 0  . nitroGLYCERIN (NITROSTAT) 0.4 MG SL tablet DISSOLVE ONE TABLET UNDER THE TONGUE EVERY 5 MINUTES AS NEEDED FOR CHEST PAIN.  DO NOT EXCEED A TOTAL OF 3 DOSES IN 15 MINUTES (Patient not taking: Reported on 07/27/2017) 25 tablet 3  . ONETOUCH DELICA LANCETS 17G MISC USE ONE DAILY (Patient not taking: Reported on 07/27/2017) 100 each 1  . ONETOUCH VERIO test strip     . ONETOUCH VERIO test strip TEST EVERY DAY 100 each 1  . potassium chloride SA (K-DUR,KLOR-CON) 20 MEQ tablet TAKE 1 TABLET BY MOUTH  EVERY DAY 90 tablet 2  . torsemide (DEMADEX) 20 MG tablet TAKE 2 TABLETS BY MOUTH  DAILY 180 tablet 0  . valsartan (DIOVAN) 80 MG tablet TAKE 1 TABLET BY MOUTH  DAILY 90 tablet 0  . Vitamin D, Ergocalciferol, (DRISDOL) 50000 units CAPS capsule Take 1 capsule (50,000 Units total) by mouth once a week. (Patient not taking: Reported on 07/27/2017) 12 capsule 1  . Vitamin D, Ergocalciferol, (DRISDOL) 50000 units CAPS capsule TAKE 1  CAPSULE BY MOUTH ONCE A WEEK 12 capsule 0   No current facility-administered medications for this visit.     Past Medical History:  Diagnosis Date  . CAD (coronary artery disease)    a. s/p NSTEMI 4/11 => s/p CABG (L-LAD, S-OM2, S-PDA/PL);  b.  ETT-Myoview 6/14:  normal study, no ischemia, EF 67%  . Carotid stenosis    Carotid U/S 0/17:  RICA 4-94%, LICA 49-67%; right vertebral flow retrograde suggestive of steel-consider PV consult  . COPD (chronic obstructive pulmonary disease) ([place])   . HLD (hyperlipidemia)   . HTN (hypertension)   . Hx of echocardiogram    Echo 6/14: Mod LVH, focal basal hypertrophy, EF 50-55%, mild AS (mean 17 mmHg) and mild AI, mild to mod LAE, mild RAE  . Leg edema   . Obesity   . Renal insufficiency   . Subclavian artery stenosis, right ([place])    based upon carotid U/S done 11/2012    Past Surgical History:  Procedure Laterality Date  . CORONARY ARTERY BYPASS GRAFT     2011, LIMA to LAD coronary artery, SVG to OM2 branch of lect circumflex coronary artery, and a sequential SVG to psot descening to posterolateral branches to RCA  . endoscopic vein harvesting     right leg   . HERNIA REPAIR    .  TONSILLECTOMY      ROS: ***  PHYSICAL EXAM There were no vitals taken for this visit.  GENERAL:  Well appearing NECK:  No jugular venous distention, waveform within normal limits, carotid upstroke brisk and symmetric, no bruits, no thyromegaly LUNGS:  Clear to auscultation bilaterally CHEST:  Well healed sternotomy scar.   HEART:  PMI not displaced or sustained,S1 and S2 within normal limits, no S3, no S4, no clicks, no rubs, *** murmurs ABD:  Flat, positive bowel sounds normal in frequency in pitch, no bruits, no rebound, no guarding, no midline pulsatile mass, no hepatomegaly, no splenomegaly EXT:  2 plus pulses throughout, no edema, no cyanosis no clubbing    GENERAL:  Well appearing NECK:  No jugular venous distention, waveform within normal limits,  carotid upstroke brisk and symmetric, no bruits, no thyromegaly LUNGS:  Clear to auscultation bilaterally BACK:  No CVA tenderness CHEST:  Well healed sternotomy scar. HEART:  PMI not displaced or sustained,S1 and S2 within normal limits, no S3, no S4, no clicks, no rubs, 2 out of 6 apical systolic murmur radiating slightly out the aortic tract, no diastolic murmurs ABD:  Flat, positive bowel sounds normal in frequency in pitch, no bruits, no rebound, no guarding, no midline pulsatile mass, no hepatomegaly, no splenomegaly EXT:  2 plus pulses upper and decreased left DP/PT, trace edema, no cyanosis no clubbing SKIN:  No rashes no nodules, mild chronic venous stasis changes   EKG: Sinus rhythm, rate 69, axis within normal limits, intervals within normal limits, no acute ST-T wave changes. PVCs PVCs. 10/29/2017   Lab Results  Component Value Date   CHOL 133 07/27/2017   TRIG 162 (H) 07/27/2017   HDL 34 (L) 07/27/2017   LDLCALC 67 07/27/2017     ASSESSMENT AND PLAN  CORONARY ATHEROSCLEROSIS NATIVE CORONARY ARTERY -  ***  He's had no new symptoms consistent with angina since his stress test in 2014.   I will continue with risk reduction.  No further testing is indicated this year.   HYPERTENSION -  The blood pressure is *** at target. No change in medications is indicated. We will continue with therapeutic lifestyle changes (TLC).  DYSLIPIDEMIA -   LDL was as above.  ***    He will remain on the meds as listed.  He will continue on meds as listed.   AS - This was severe last year in Nov but stable.  ***  I will order an echo to follow up.  CAROTID STENOSIS - This was done in June of last year and was moderate.  ***  He is due for follow up.  I will order this.  OBESITY - ***  He has lost some weight and I am proud of this and I encouraged more of the same.

## 2017-10-31 ENCOUNTER — Ambulatory Visit: Payer: Medicare Other | Admitting: Cardiology

## 2017-11-23 ENCOUNTER — Encounter: Payer: Self-pay | Admitting: Pediatrics

## 2017-11-23 ENCOUNTER — Ambulatory Visit: Payer: Medicare Other | Admitting: Pediatrics

## 2017-11-23 VITALS — BP 121/76 | HR 100 | Temp 98.2°F | Resp 22 | Ht 67.5 in | Wt 238.2 lb

## 2017-11-23 DIAGNOSIS — J441 Chronic obstructive pulmonary disease with (acute) exacerbation: Secondary | ICD-10-CM | POA: Diagnosis not present

## 2017-11-23 MED ORDER — AZITHROMYCIN 250 MG PO TABS: ORAL_TABLET | ORAL | 0 refills | Status: DC

## 2017-11-23 MED ORDER — ALBUTEROL SULFATE HFA 108 (90 BASE) MCG/ACT IN AERS: 2.0000 | INHALATION_SPRAY | Freq: Four times a day (QID) | RESPIRATORY_TRACT | 0 refills | Status: DC | PRN

## 2017-11-23 MED ORDER — PREDNISONE 20 MG PO TABS: ORAL_TABLET | ORAL | 0 refills | Status: DC

## 2017-11-23 NOTE — Progress Notes (Signed)
  Subjective:   Patient ID: Matthew May, male    DOB: 1945/07/17, 72 y.o.   MRN: [id number] CC: Sore Throat and Nasal Congestion  HPI: Matthew May is a 72 y.o. male   Sore throat started yesterday, feeling worse today . Now feels like his breathing is affected.  Coughing some off and on.  He does not usually cough.  He had to be on inhalers in the past, has not needed any recently.  No fevers.  No ear pain.  Having some shortness of breath.  No chest pain.  Relevant past medical, surgical, family and social history reviewed. Allergies and medications reviewed and updated. Social History   Tobacco Use  Smoking Status Former Smoker  . Last attempt to quit: 07/11/2009  . Years since quitting: 8.3  Smokeless Tobacco Never Used  Tobacco Comment   smoked about 10 cig/day; used to smoke 2 ppd for many years    ROS: Per HPI   Objective:    BP 121/76   Pulse 100   Temp 98.2 F (36.8 C) (Oral)   Resp (!) 22   Ht 5' 7.5" (1.715 m)   Wt 238 lb 3.2 oz (108 kg)   SpO2 100%   BMI 36.76 kg/m   Wt Readings from Last 3 Encounters:  11/23/17 238 lb 3.2 oz (108 kg)  07/27/17 234 lb (106.1 kg)  06/18/17 228 lb (103.4 kg)    Gen: NAD, alert, cooperative with exam, NCAT EYES: EOMI, no conjunctival injection, or no icterus ENT: Right TM obscured by cerumen, normal left TM. OP with erythema LYMPH: no cervical LAD CV: NRRR, normal D3/O6, II/VI systolic ejection murmur Resp: Expiratory wheezing bilaterally, most prominent anteriorly.  No crackles.  Comfortable WOB Abd: +BS, soft, NTND. no guarding or organomegaly Ext: No edema, warm Neuro: Alert and oriented   Assessment & Plan:  Junie was seen today for sore throat and nasal congestion.  Diagnoses and all orders for this visit:  COPD exacerbation ([place]) + Shortness of breath, wheezing.  Start below.  Return precautions discussed. -     predniSONE (DELTASONE) 20 MG tablet; 2 po at same time daily for 3 days -     azithromycin  (ZITHROMAX) 250 MG tablet; Take 2 the first day and then one each day after. -     albuterol (PROVENTIL HFA;VENTOLIN HFA) 108 (90 Base) MCG/ACT inhaler; Inhale 2 puffs into the lungs every 6 (six) hours as needed for wheezing or shortness of breath.   Follow up plan: As scheduled with PCP on Monday Assunta Found, MD [place]

## 2017-11-26 ENCOUNTER — Ambulatory Visit (INDEPENDENT_AMBULATORY_CARE_PROVIDER_SITE_OTHER): Payer: Medicare Other

## 2017-11-26 ENCOUNTER — Encounter: Payer: Self-pay | Admitting: Family

## 2017-11-26 ENCOUNTER — Ambulatory Visit: Payer: Medicare Other | Admitting: Family

## 2017-11-26 VITALS — BP 131/66 | HR 83 | Temp 97.0°F | Ht 67.5 in | Wt 232.0 lb

## 2017-11-26 DIAGNOSIS — E559 Vitamin D deficiency, unspecified: Secondary | ICD-10-CM | POA: Diagnosis not present

## 2017-11-26 DIAGNOSIS — J42 Unspecified chronic bronchitis: Secondary | ICD-10-CM

## 2017-11-26 DIAGNOSIS — Z1212 Encounter for screening for malignant neoplasm of rectum: Secondary | ICD-10-CM

## 2017-11-26 DIAGNOSIS — I350 Nonrheumatic aortic (valve) stenosis: Secondary | ICD-10-CM

## 2017-11-26 DIAGNOSIS — Z1211 Encounter for screening for malignant neoplasm of colon: Secondary | ICD-10-CM

## 2017-11-26 DIAGNOSIS — E038 Other specified hypothyroidism: Secondary | ICD-10-CM

## 2017-11-26 DIAGNOSIS — R062 Wheezing: Secondary | ICD-10-CM

## 2017-11-26 DIAGNOSIS — E785 Hyperlipidemia, unspecified: Secondary | ICD-10-CM | POA: Diagnosis not present

## 2017-11-26 DIAGNOSIS — I2510 Atherosclerotic heart disease of native coronary artery without angina pectoris: Secondary | ICD-10-CM

## 2017-11-26 DIAGNOSIS — E1159 Type 2 diabetes mellitus with other circulatory complications: Secondary | ICD-10-CM | POA: Diagnosis not present

## 2017-11-26 DIAGNOSIS — I10 Essential (primary) hypertension: Secondary | ICD-10-CM

## 2017-11-26 DIAGNOSIS — E1165 Type 2 diabetes mellitus with hyperglycemia: Secondary | ICD-10-CM | POA: Diagnosis not present

## 2017-11-26 DIAGNOSIS — J449 Chronic obstructive pulmonary disease, unspecified: Secondary | ICD-10-CM | POA: Diagnosis not present

## 2017-11-26 DIAGNOSIS — E1169 Type 2 diabetes mellitus with other specified complication: Secondary | ICD-10-CM | POA: Diagnosis not present

## 2017-11-26 LAB — BAYER DCA HB A1C WAIVED: HB A1C (BAYER DCA - WAIVED): 8.2 % — ABNORMAL HIGH (ref <7.0)

## 2017-11-26 MED ORDER — FLUTICASONE FUROATE-VILANTEROL 100-25 MCG/INH IN AEPB: 1.0000 | INHALATION_SPRAY | Freq: Every day | RESPIRATORY_TRACT | 2 refills | Status: DC

## 2017-11-26 MED ORDER — PREDNISONE 10 MG (21) PO TBPK: ORAL_TABLET | ORAL | 0 refills | Status: DC

## 2017-11-26 NOTE — Patient Instructions (Signed)

## 2017-11-26 NOTE — Progress Notes (Signed)
Subjective:    Patient ID: Matthew May, male    DOB: [birth year], 72 y.o.   MRN: [id number]  Chief Complaint  Patient presents with  . Medical Management of Chronic Issues    four month recheck   Pt presents to the office today for chronic follow up. Pt is followed by Cardiologistsevery 3-6 months for CAD. Diabetes  He presents for his follow-up diabetic visit. He has type 2 diabetes mellitus. His disease course has been stable. There are no hypoglycemic associated symptoms. Associated symptoms include fatigue and foot paresthesias. Pertinent negatives for diabetes include no blurred vision and no visual change. There are no hypoglycemic complications. Symptoms are worsening. Diabetic complications include heart disease. Pertinent negatives for diabetic complications include no CVA. Risk factors for coronary artery disease include dyslipidemia, hypertension, male sex, obesity and sedentary lifestyle. He is following a generally unhealthy diet. (Pt states his wife checks it and states he " thinks they are good") Eye exam is not current.  Hypertension  This is a chronic problem. The current episode started more than 1 year ago. The problem has been waxing and waning since onset. The problem is controlled. Associated symptoms include malaise/fatigue and shortness of breath. Pertinent negatives include no blurred vision or peripheral edema. Risk factors for coronary artery disease include dyslipidemia, diabetes mellitus, male gender and obesity. The current treatment provides moderate improvement. There is no history of CVA. Identifiable causes of hypertension include a thyroid problem.  Hyperlipidemia  This is a chronic problem. The current episode started more than 1 year ago. The problem is controlled. Recent lipid tests were reviewed and are normal. Exacerbating diseases include obesity. Associated symptoms include shortness of breath. Current antihyperlipidemic treatment includes statins. The  current treatment provides moderate improvement of lipids. Risk factors for coronary artery disease include dyslipidemia, male sex, hypertension and a sedentary lifestyle.  Thyroid Problem  Presents for follow-up visit. Symptoms include depressed mood, fatigue and hoarse voice. Patient reports no visual change. The symptoms have been stable. His past medical history is significant for hyperlipidemia.  COPD Pt states he quit smoking in 2011. Having SOB with productive cough. Takine Zpak and prednisone.     Review of Systems  Constitutional: Positive for fatigue and malaise/fatigue.  HENT: Positive for hoarse voice.   Eyes: Negative for blurred vision.  Respiratory: Positive for shortness of breath.   All other systems reviewed and are negative.      Objective:   Physical Exam  Constitutional: He is oriented to person, place, and time. He appears well-developed and well-nourished. No distress.  HENT:  Head: Normocephalic.  Right Ear: External ear normal.  Left Ear: External ear normal.  Mouth/Throat: Oropharynx is clear and moist.  Eyes: Pupils are equal, round, and reactive to light. Right eye exhibits no discharge. Left eye exhibits no discharge.  Neck: Normal range of motion. Neck supple. No thyromegaly present.  Cardiovascular: Normal rate, regular rhythm, normal heart sounds and intact distal pulses.  No murmur heard. Pulmonary/Chest: Effort normal. No respiratory distress. He has wheezes.  Abdominal: Soft. Bowel sounds are normal. He exhibits no distension. There is no tenderness.  Musculoskeletal: Normal range of motion. He exhibits no edema or tenderness.  Using cane  Neurological: He is alert and oriented to person, place, and time. He has normal reflexes. No cranial nerve deficit.  Skin: Skin is warm and dry. No rash noted. No erythema.  Psychiatric: He has a normal mood and affect. His behavior is normal. Judgment and  thought content normal.  Vitals reviewed.     BP  131/66   Pulse 83   Temp (!) 97 F (36.1 C) (Oral)   Ht 5' 7.5" (1.715 m)   Wt 232 lb (105.2 kg)   BMI 35.80 kg/m      Assessment & Plan:  Matthew May comes in today with chief complaint of Medical Management of Chronic Issues (four month recheck)   Diagnosis and orders addressed:  1. Hyperlipidemia associated with type 2 diabetes mellitus (HCC) - CMP14+EGFR - Lipid panel  2. Hypertension associated with diabetes ([place]) - CMP14+EGFR  3. Atherosclerosis of native coronary artery of native heart without angina pectoris - CMP14+EGFR  4. Other specified hypothyroidism - CMP14+EGFR - TSH  5. Type 2 diabetes mellitus with hyperglycemia, without long-term current use of insulin (HCC) - CMP14+EGFR - Bayer DCA Hb A1c Waived - Microalbumin / creatinine urine ratio  6. Morbid obesity ([place]) - CMP14+EGFR  7. Aortic valve stenosis, etiology of cardiac valve disease unspecified - CMP14+EGFR  8. Vitamin D deficiency - CMP14+EGFR  9. Colon cancer screening - CMP14+EGFR - Cologuard  10. Screening for malignant neoplasm of the rectum - CMP14+EGFR - Cologuard  11. Chronic bronchitis, unspecified chronic bronchitis type ([place]) Will start Breo today and do another round of prednisone  - fluticasone furoate-vilanterol (BREO ELLIPTA) 100-25 MCG/INH AEPB; Inhale 1 puff into the lungs daily.  Dispense: 1 each; Refill: 2 - DG Chest 2 View; Future - predniSONE (STERAPRED UNI-PAK 21 TAB) 10 MG (21) TBPK tablet; Use as directed  Dispense: 21 tablet; Refill: 0  12. Wheezing - DG Chest 2 View; Future - predniSONE (STERAPRED UNI-PAK 21 TAB) 10 MG (21) TBPK tablet; Use as directed  Dispense: 21 tablet; Refill: 0   Labs pending Health Maintenance reviewed Diet and exercise encouraged  Follow up plan: 3 months    Evelina Dun, FNP

## 2017-11-27 ENCOUNTER — Telehealth: Payer: Self-pay

## 2017-11-27 ENCOUNTER — Other Ambulatory Visit: Payer: Self-pay | Admitting: Family

## 2017-11-27 LAB — TSH: TSH: 2.97 u[IU]/mL (ref 0.450–4.500)

## 2017-11-27 LAB — CMP14+EGFR
ALT: 43 IU/L (ref 0–44)
AST: 24 IU/L (ref 0–40)
Albumin/Globulin Ratio: 1.6 (ref 1.2–2.2)
Albumin: 4.6 g/dL (ref 3.5–4.8)
Alkaline Phosphatase: 77 IU/L (ref 39–117)
BUN/Creatinine Ratio: 23 (ref 10–24)
BUN: 25 mg/dL (ref 8–27)
Bilirubin Total: 0.4 mg/dL (ref 0.0–1.2)
CO2: 24 mmol/L (ref 20–29)
Calcium: 9.8 mg/dL (ref 8.6–10.2)
Chloride: 100 mmol/L (ref 96–106)
Creatinine, Ser: 1.1 mg/dL (ref 0.76–1.27)
GFR calc Af Amer: 78 mL/min/{1.73_m2} (ref >59)
GFR calc non Af Amer: 68 mL/min/{1.73_m2} (ref >59)
Globulin, Total: 2.8 g/dL (ref 1.5–4.5)
Glucose: 220 mg/dL — ABNORMAL HIGH (ref 65–99)
Potassium: 4 mmol/L (ref 3.5–5.2)
Sodium: 142 mmol/L (ref 134–144)
Total Protein: 7.4 g/dL (ref 6.0–8.5)

## 2017-11-27 LAB — LIPID PANEL
Chol/HDL Ratio: 4.2 ratio (ref 0.0–5.0)
Cholesterol, Total: 143 mg/dL (ref 100–199)
HDL: 34 mg/dL — ABNORMAL LOW (ref >39)
LDL Calculated: 88 mg/dL (ref 0–99)
Triglycerides: 103 mg/dL (ref 0–149)
VLDL Cholesterol Cal: 21 mg/dL (ref 5–40)

## 2017-11-27 LAB — MICROALBUMIN / CREATININE URINE RATIO
Creatinine, Urine: 126.6 mg/dL
Microalb/Creat Ratio: 4.3 mg/g creat (ref 0.0–30.0)
Microalbumin, Urine: 5.5 ug/mL

## 2017-11-27 MED ORDER — EMPAGLIFLOZIN 10 MG PO TABS: 10.0000 mg | ORAL_TABLET | Freq: Every day | ORAL | 2 refills | Status: DC

## 2017-11-27 MED ORDER — DAPAGLIFLOZIN PROPANEDIOL 5 MG PO TABS: 5.0000 mg | ORAL_TABLET | Freq: Every day | ORAL | 1 refills | Status: DC

## 2017-11-27 NOTE — Telephone Encounter (Signed)
Will change Faxiga to [place] per insurance.

## 2017-11-27 NOTE — Telephone Encounter (Signed)
Insurance denfod Faxiga  Must try two of the following  Invokana Jardiance, Metformin, Metformin ER or Riomet

## 2017-11-27 NOTE — Telephone Encounter (Signed)
Patient aware and verbalizes understanding. 

## 2017-11-30 ENCOUNTER — Encounter (HOSPITAL_COMMUNITY): Payer: Self-pay | Admitting: *Deleted

## 2017-11-30 ENCOUNTER — Emergency Department (HOSPITAL_COMMUNITY)
Admission: EM | Admit: 2017-11-30 | Discharge: 2017-12-01 | Disposition: A | Discharge status: Home / Self Care | Payer: Medicare Other | Attending: Emergency Medicine | Admitting: Emergency Medicine

## 2017-11-30 ENCOUNTER — Other Ambulatory Visit: Payer: Self-pay

## 2017-11-30 DIAGNOSIS — Z951 Presence of aortocoronary bypass graft: Secondary | ICD-10-CM | POA: Diagnosis not present

## 2017-11-30 DIAGNOSIS — I2510 Atherosclerotic heart disease of native coronary artery without angina pectoris: Secondary | ICD-10-CM | POA: Insufficient documentation

## 2017-11-30 DIAGNOSIS — J449 Chronic obstructive pulmonary disease, unspecified: Secondary | ICD-10-CM | POA: Insufficient documentation

## 2017-11-30 DIAGNOSIS — Z7982 Long term (current) use of aspirin: Secondary | ICD-10-CM | POA: Insufficient documentation

## 2017-11-30 DIAGNOSIS — Z79899 Other long term (current) drug therapy: Secondary | ICD-10-CM | POA: Diagnosis not present

## 2017-11-30 DIAGNOSIS — R739 Hyperglycemia, unspecified: Secondary | ICD-10-CM

## 2017-11-30 DIAGNOSIS — E1165 Type 2 diabetes mellitus with hyperglycemia: Secondary | ICD-10-CM | POA: Insufficient documentation

## 2017-11-30 DIAGNOSIS — R0981 Nasal congestion: Secondary | ICD-10-CM | POA: Diagnosis not present

## 2017-11-30 DIAGNOSIS — I10 Essential (primary) hypertension: Secondary | ICD-10-CM | POA: Insufficient documentation

## 2017-11-30 DIAGNOSIS — Z7984 Long term (current) use of oral hypoglycemic drugs: Secondary | ICD-10-CM | POA: Insufficient documentation

## 2017-11-30 DIAGNOSIS — R05 Cough: Secondary | ICD-10-CM | POA: Diagnosis not present

## 2017-11-30 DIAGNOSIS — Z87891 Personal history of nicotine dependence: Secondary | ICD-10-CM | POA: Insufficient documentation

## 2017-11-30 LAB — URINALYSIS, ROUTINE W REFLEX MICROSCOPIC
Bacteria, UA: NONE SEEN
Bilirubin Urine: NEGATIVE
Glucose, UA: >=500 mg/dL — AB
Hgb urine dipstick: NEGATIVE
Ketones, ur: NEGATIVE mg/dL
Leukocytes, UA: NEGATIVE
Nitrite: NEGATIVE
Protein, ur: NEGATIVE mg/dL
Specific Gravity, Urine: 1.03 (ref 1.005–1.030)
pH: 5 (ref 5.0–8.0)

## 2017-11-30 LAB — CBG MONITORING, ED: Glucose-Capillary: 380 mg/dL — ABNORMAL HIGH (ref 70–99)

## 2017-11-30 MED ORDER — IPRATROPIUM-ALBUTEROL 0.5-2.5 (3) MG/3ML IN SOLN
3.0000 mL | Freq: Once | RESPIRATORY_TRACT | Status: AC
  Administered 2017-12-01: 3 mL via RESPIRATORY_TRACT
  Filled 2017-11-30: qty 3

## 2017-11-30 MED ORDER — SODIUM CHLORIDE 0.9 % IV BOLUS
1000.0000 mL | Freq: Once | INTRAVENOUS | Status: AC
  Administered 2017-12-01: 1000 mL via INTRAVENOUS

## 2017-11-30 MED ORDER — INSULIN ASPART 100 UNIT/ML ~~LOC~~ SOLN
15.0000 [IU] | Freq: Once | SUBCUTANEOUS | Status: AC
  Administered 2017-12-01: 15 [IU] via SUBCUTANEOUS
  Filled 2017-11-30: qty 1

## 2017-11-30 NOTE — ED Provider Notes (Signed)
[facility] EMERGENCY DEPARTMENT Provider Note   CSN: [id number] Arrival date & time: 11/30/17  2201     History   Chief Complaint Chief Complaint  Patient presents with  . Hyperglycemia    HPI Matthew May is a 72 y.o. male.  Patient presents to the emergency department for evaluation of elevated blood sugar.  Patient has a history of diabetes.  He has been on metformin for a long time.  He has had upper respiratory infection symptoms, his doctor started him on prednisone this past week.  Blood sugars have been elevated, his doctor started him on Jardiance, but sugars have stayed elevated.  He reports that his blood sugar was over 500 tonight, so he came to the ER for evaluation.     Past Medical History:  Diagnosis Date  . CAD (coronary artery disease)    a. s/p NSTEMI 4/11 => s/p CABG (L-LAD, S-OM2, S-PDA/PL);  b.  ETT-Myoview 6/14:  normal study, no ischemia, EF 67%  . Carotid stenosis    Carotid U/S 0/24:  RICA 0-97%, LICA 35-32%; right vertebral flow retrograde suggestive of steel-consider PV consult  . COPD (chronic obstructive pulmonary disease) ([place])   . HLD (hyperlipidemia)   . HTN (hypertension)   . Hx of echocardiogram    Echo 6/14: Mod LVH, focal basal hypertrophy, EF 50-55%, mild AS (mean 17 mmHg) and mild AI, mild to mod LAE, mild RAE  . Leg edema   . Obesity   . Renal insufficiency   . Subclavian artery stenosis, right ([place])    based upon carotid U/S done 11/2012    Patient Active Problem List   Diagnosis Date Noted  . Cellulitis and abscess of left leg 01/18/2017  . Bilateral carotid artery stenosis 11/01/2016  . Aortic valve stenosis 11/01/2016  . Morbid obesity ([place]) 11/29/2015  . Trigeminy 05/23/2013  . Subclavian artery stenosis, right ([place]) 12/03/2012  . Diabetes mellitus, type 2 ([place]) 08/26/2012  . Vitamin D deficiency 08/26/2012  . Hypothyroidism 07/12/2011  . Hyperlipidemia associated with type 2 diabetes mellitus ([place]) 11/10/2009  .  CORONARY ATHEROSCLEROSIS NATIVE CORONARY ARTERY 11/10/2009  . Hypertension associated with diabetes ([place]) 10/26/2009    Past Surgical History:  Procedure Laterality Date  . CORONARY ARTERY BYPASS GRAFT     2011, LIMA to LAD coronary artery, SVG to OM2 branch of lect circumflex coronary artery, and a sequential SVG to psot descening to posterolateral branches to RCA  . endoscopic vein harvesting     right leg   . HERNIA REPAIR    . TONSILLECTOMY          Home Medications    Prior to Admission medications   Medication Sig Start Date End Date Taking? Authorizing Provider  albuterol (PROVENTIL HFA;VENTOLIN HFA) 108 (90 Base) MCG/ACT inhaler Inhale 2 puffs into the lungs every 6 (six) hours as needed for wheezing or shortness of breath. 11/23/17   Eustaquio Maize, MD  aspirin EC 81 MG tablet Take 81 mg by mouth daily.    [provider]  atorvastatin (LIPITOR) 40 MG tablet TAKE 1 TABLET BY MOUTH  DAILY 10/26/17   Evelina Dun A, FNP  azithromycin (ZITHROMAX) 250 MG tablet Take 2 the first day and then one each day after. 11/23/17   Eustaquio Maize, MD  empagliflozin (JARDIANCE) 10 MG TABS tablet Take 10 mg by mouth daily. 11/27/17   [name], Alyse Low A, FNP  fluticasone furoate-vilanterol (BREO ELLIPTA) 100-25 MCG/INH AEPB Inhale 1 puff into the  lungs daily. 11/26/17   Sharion Balloon, FNP  levothyroxine (SYNTHROID, LEVOTHROID) 125 MCG tablet TAKE 1 TABLET BY MOUTH  DAILY 01/09/17   Evelina Dun A, FNP  metFORMIN (GLUCOPHAGE) 1000 MG tablet TAKE 1 TABLET BY MOUTH TWO  TIMES DAILY 10/26/17   Evelina Dun A, FNP  nitroGLYCERIN (NITROSTAT) 0.4 MG SL tablet DISSOLVE ONE TABLET UNDER THE TONGUE EVERY 5 MINUTES AS NEEDED FOR CHEST PAIN.  DO NOT EXCEED A TOTAL OF 3 DOSES IN 15 MINUTES 04/24/17   Sharion Balloon, FNP  [facility] DELICA LANCETS 59D MISC USE ONE DAILY 06/06/17   Sharion Balloon, FNP  [facility] VERIO test strip  03/04/17   [provider]  [facility] VERIO test strip TEST  EVERY DAY 10/26/17   Evelina Dun A, FNP  potassium chloride SA (K-DUR,KLOR-CON) 20 MEQ tablet TAKE 1 TABLET BY MOUTH  EVERY DAY 10/26/17   Minus Breeding, MD  torsemide (DEMADEX) 20 MG tablet TAKE 2 TABLETS BY MOUTH  DAILY 10/26/17   Evelina Dun A, FNP  valsartan (DIOVAN) 80 MG tablet TAKE 1 TABLET BY MOUTH  DAILY 10/26/17   Sharion Balloon, FNP  Vitamin D, Ergocalciferol, (DRISDOL) 50000 units CAPS capsule TAKE 1 CAPSULE BY MOUTH ONCE A WEEK 09/24/17   Sharion Balloon, FNP    Family History Family History  Problem Relation Age of Onset  . Heart disease Father   . Heart attack Father   . Mental illness Mother   . Mental illness Sister   . Diabetes Brother   . Depression Maternal Aunt     Social History Social History   Tobacco Use  . Smoking status: Former Smoker    Last attempt to quit: 07/11/2009    Years since quitting: 8.3  . Smokeless tobacco: Never Used  . Tobacco comment: smoked about 10 cig/day; used to smoke 2 ppd for many years   Substance Use Topics  . Alcohol use: No    Alcohol/week: 0.0 oz  . Drug use: No     Allergies   Lisinopril   Review of Systems Review of Systems  HENT: Positive for congestion.   Respiratory: Positive for cough.   All other systems reviewed and are negative.    Physical Exam Updated Vital Signs BP (!) 152/72   Pulse 83   Temp 97.7 F (36.5 C) (Oral)   Resp 20   Ht 5\' 7"  (1.702 m)   Wt 103.9 kg (229 lb)   SpO2 95%   BMI 35.87 kg/m   Physical Exam  Constitutional: He is oriented to person, place, and time. He appears well-developed and well-nourished. No distress.  HENT:  Head: Normocephalic and atraumatic.  Right Ear: Hearing normal.  Left Ear: Hearing normal.  Nose: Nose normal.  Mouth/Throat: Oropharynx is clear and moist and mucous membranes are normal.  Eyes: Pupils are equal, round, and reactive to light. Conjunctivae and EOM are normal.  Neck: Normal range of motion. Neck supple.  Cardiovascular: Regular  rhythm, S1 normal and S2 normal. Exam reveals no gallop and no friction rub.  No murmur heard. Pulmonary/Chest: Effort normal. No respiratory distress. He has wheezes. He exhibits no tenderness.  Abdominal: Soft. Normal appearance and bowel sounds are normal. There is no hepatosplenomegaly. There is no tenderness. There is no rebound, no guarding, no tenderness at McBurney's point and negative Murphy's sign. No hernia.  Musculoskeletal: Normal range of motion.  Neurological: He is alert and oriented to person, place, and time. He has normal strength.  No cranial nerve deficit or sensory deficit. Coordination normal. GCS eye subscore is 4. GCS verbal subscore is 5. GCS motor subscore is 6.  Skin: Skin is warm, dry and intact. No rash noted. No cyanosis.  Psychiatric: He has a normal mood and affect. His speech is normal and behavior is normal. Thought content normal.  Nursing note and vitals reviewed.    ED Treatments / Results  Labs (all labs ordered are listed, but only abnormal results are displayed) Labs Reviewed  URINALYSIS, ROUTINE W REFLEX MICROSCOPIC - Abnormal; Notable for the following components:      Result Value   Color, Urine STRAW (*)    Glucose, UA >=500 (*)    All other components within normal limits  CBG MONITORING, ED - Abnormal; Notable for the following components:   Glucose-Capillary 380 (*)    All other components within normal limits  CBG MONITORING, ED - Abnormal; Notable for the following components:   Glucose-Capillary 241 (*)    All other components within normal limits  I-STAT CHEM 8, ED - Abnormal; Notable for the following components:   Potassium 3.4 (*)    BUN 28 (*)    Glucose, Bld 233 (*)    Calcium, Ion 0.90 (*)    Hemoglobin 11.9 (*)    HCT 35.0 (*)    All other components within normal limits    EKG None  Radiology No results found.  Procedures Procedures (including critical care time)  Medications Ordered in ED Medications  sodium  chloride 0.9 % bolus 1,000 mL (0 mLs Intravenous Stopped 12/01/17 0133)  insulin aspart (novoLOG) injection 15 Units (15 Units Subcutaneous Given 12/01/17 0013)  ipratropium-albuterol (DUONEB) 0.5-2.5 (3) MG/3ML nebulizer solution 3 mL (3 mLs Nebulization Given 12/01/17 0036)  insulin aspart (novoLOG) injection 5 Units (5 Units Subcutaneous Given 12/01/17 0215)     Initial Impression / Assessment and Plan / ED Course  I have reviewed the triage vital signs and the nursing notes.  Pertinent labs & imaging results that were available during my care of the patient were reviewed by me and considered in my medical decision making (see chart for details).     Patient presents to the emergency department for evaluation of elevated blood sugar.  He reports that his blood sugar was over 500 at home.  Sugar was 380 at arrival.  He was given IV fluid and insulin and has significantly improved.  Chemistries do not suggest DKA.  Patient appears well.  Elevated sugars secondary to prednisone use.  He was prescribed prednisone for upper respiratory symptoms.  He did have slight wheezing here in the ER.  This improved with DuoNeb.  We will continue bronchodilator therapy at home, stop prednisone.  Follow-up with PCP.  Final Clinical Impressions(s) / ED Diagnoses   Final diagnoses:  Hyperglycemia    ED Discharge Orders    None       Pollina, Gwenyth Allegra, MD 12/01/17 [phone]

## 2017-11-30 NOTE — ED Triage Notes (Signed)
Pt arrives ambulatory to triage room. Pt says his sugar was in the 500's at home tonight. He was started on prednisone on Monday and his sugars have been running high since. Pt is taking all of his other medications as prescribed, started on Jardiance on Tuesday.

## 2017-12-01 LAB — I-STAT CHEM 8, ED
BUN: 28 mg/dL — ABNORMAL HIGH (ref 8–23)
Calcium, Ion: 0.9 mmol/L — ABNORMAL LOW (ref 1.15–1.40)
Chloride: 99 mmol/L (ref 98–111)
Creatinine, Ser: 0.9 mg/dL (ref 0.61–1.24)
Glucose, Bld: 233 mg/dL — ABNORMAL HIGH (ref 70–99)
HCT: 35 % — ABNORMAL LOW (ref 39.0–52.0)
Hemoglobin: 11.9 g/dL — ABNORMAL LOW (ref 13.0–17.0)
Potassium: 3.4 mmol/L — ABNORMAL LOW (ref 3.5–5.1)
Sodium: 135 mmol/L (ref 135–145)
TCO2: 26 mmol/L (ref 22–32)

## 2017-12-01 LAB — CBG MONITORING, ED: Glucose-Capillary: 241 mg/dL — ABNORMAL HIGH (ref 70–99)

## 2017-12-01 MED ORDER — INSULIN ASPART 100 UNIT/ML ~~LOC~~ SOLN
5.0000 [IU] | Freq: Once | SUBCUTANEOUS | Status: AC
Start: 2017-12-01 — End: 2017-12-01
  Administered 2017-12-01: 5 [IU] via SUBCUTANEOUS
  Filled 2017-12-01: qty 1

## 2017-12-01 NOTE — Discharge Instructions (Signed)
Stop taking the Prednisone

## 2017-12-13 ENCOUNTER — Other Ambulatory Visit: Payer: Self-pay | Admitting: Family

## 2017-12-14 NOTE — Telephone Encounter (Signed)
Last Vit D 07/27/17  53.6

## 2017-12-30 ENCOUNTER — Other Ambulatory Visit: Payer: Self-pay

## 2017-12-30 ENCOUNTER — Inpatient Hospital Stay (HOSPITAL_COMMUNITY)
Admission: EM | Admit: 2017-12-30 | Discharge: 2018-01-01 | DRG: 287 | Disposition: A | Discharge status: Home / Self Care | Payer: Medicare Other | Attending: Cardiology | Admitting: Cardiology

## 2017-12-30 ENCOUNTER — Emergency Department (HOSPITAL_COMMUNITY): Payer: Medicare Other

## 2017-12-30 ENCOUNTER — Encounter (HOSPITAL_COMMUNITY): Payer: Self-pay | Admitting: *Deleted

## 2017-12-30 DIAGNOSIS — Z79899 Other long term (current) drug therapy: Secondary | ICD-10-CM

## 2017-12-30 DIAGNOSIS — I25810 Atherosclerosis of coronary artery bypass graft(s) without angina pectoris: Secondary | ICD-10-CM | POA: Diagnosis present

## 2017-12-30 DIAGNOSIS — Z7982 Long term (current) use of aspirin: Secondary | ICD-10-CM | POA: Diagnosis not present

## 2017-12-30 DIAGNOSIS — Z79890 Hormone replacement therapy (postmenopausal): Secondary | ICD-10-CM

## 2017-12-30 DIAGNOSIS — E669 Obesity, unspecified: Secondary | ICD-10-CM | POA: Diagnosis present

## 2017-12-30 DIAGNOSIS — I10 Essential (primary) hypertension: Secondary | ICD-10-CM | POA: Diagnosis not present

## 2017-12-30 DIAGNOSIS — J449 Chronic obstructive pulmonary disease, unspecified: Secondary | ICD-10-CM | POA: Diagnosis present

## 2017-12-30 DIAGNOSIS — E785 Hyperlipidemia, unspecified: Secondary | ICD-10-CM | POA: Diagnosis present

## 2017-12-30 DIAGNOSIS — Z6835 Body mass index (BMI) 35.0-35.9, adult: Secondary | ICD-10-CM

## 2017-12-30 DIAGNOSIS — R42 Dizziness and giddiness: Secondary | ICD-10-CM

## 2017-12-30 DIAGNOSIS — I6529 Occlusion and stenosis of unspecified carotid artery: Secondary | ICD-10-CM | POA: Diagnosis not present

## 2017-12-30 DIAGNOSIS — Z7984 Long term (current) use of oral hypoglycemic drugs: Secondary | ICD-10-CM | POA: Diagnosis not present

## 2017-12-30 DIAGNOSIS — I2510 Atherosclerotic heart disease of native coronary artery without angina pectoris: Secondary | ICD-10-CM | POA: Diagnosis not present

## 2017-12-30 DIAGNOSIS — R0602 Shortness of breath: Secondary | ICD-10-CM | POA: Diagnosis not present

## 2017-12-30 DIAGNOSIS — R55 Syncope and collapse: Secondary | ICD-10-CM

## 2017-12-30 DIAGNOSIS — I350 Nonrheumatic aortic (valve) stenosis: Secondary | ICD-10-CM | POA: Diagnosis not present

## 2017-12-30 DIAGNOSIS — Z951 Presence of aortocoronary bypass graft: Secondary | ICD-10-CM

## 2017-12-30 DIAGNOSIS — Z87891 Personal history of nicotine dependence: Secondary | ICD-10-CM

## 2017-12-30 DIAGNOSIS — Z883 Allergy status to other anti-infective agents status: Secondary | ICD-10-CM | POA: Diagnosis not present

## 2017-12-30 DIAGNOSIS — E119 Type 2 diabetes mellitus without complications: Secondary | ICD-10-CM | POA: Diagnosis not present

## 2017-12-30 DIAGNOSIS — Z8249 Family history of ischemic heart disease and other diseases of the circulatory system: Secondary | ICD-10-CM

## 2017-12-30 HISTORY — DX: Nonrheumatic aortic (valve) stenosis: I35.0

## 2017-12-30 LAB — TROPONIN I: Troponin I: <0.03 ng/mL (ref <0.03)

## 2017-12-30 LAB — CBC
HCT: 37.9 % — ABNORMAL LOW (ref 39.0–52.0)
Hemoglobin: 12.5 g/dL — ABNORMAL LOW (ref 13.0–17.0)
MCH: 29.8 pg (ref 26.0–34.0)
MCHC: 33 g/dL (ref 30.0–36.0)
MCV: 90.2 fL (ref 78.0–100.0)
Platelets: 172 10*3/uL (ref 150–400)
RBC: 4.2 MIL/uL — ABNORMAL LOW (ref 4.22–5.81)
RDW: 13 % (ref 11.5–15.5)
WBC: 8.9 10*3/uL (ref 4.0–10.5)

## 2017-12-30 LAB — CBC WITH DIFFERENTIAL/PLATELET
Basophils Absolute: 0 10*3/uL (ref 0.0–0.1)
Basophils Relative: 0 %
Eosinophils Absolute: 0.2 10*3/uL (ref 0.0–0.7)
Eosinophils Relative: 2 %
HCT: 40.4 % (ref 39.0–52.0)
Hemoglobin: 13.5 g/dL (ref 13.0–17.0)
Lymphocytes Relative: 24 %
Lymphs Abs: 2.1 10*3/uL (ref 0.7–4.0)
MCH: 30.1 pg (ref 26.0–34.0)
MCHC: 33.4 g/dL (ref 30.0–36.0)
MCV: 90.2 fL (ref 78.0–100.0)
Monocytes Absolute: 0.4 10*3/uL (ref 0.1–1.0)
Monocytes Relative: 4 %
Neutro Abs: 6.3 10*3/uL (ref 1.7–7.7)
Neutrophils Relative %: 70 %
Platelets: 186 10*3/uL (ref 150–400)
RBC: 4.48 MIL/uL (ref 4.22–5.81)
RDW: 13.3 % (ref 11.5–15.5)
WBC: 9 10*3/uL (ref 4.0–10.5)

## 2017-12-30 LAB — BASIC METABOLIC PANEL
Anion gap: 12 (ref 5–15)
BUN: 16 mg/dL (ref 8–23)
CO2: 26 mmol/L (ref 22–32)
Calcium: 9.2 mg/dL (ref 8.9–10.3)
Chloride: 100 mmol/L (ref 98–111)
Creatinine, Ser: 0.93 mg/dL (ref 0.61–1.24)
GFR calc Af Amer: >60 mL/min (ref >60)
GFR calc non Af Amer: >60 mL/min (ref >60)
Glucose, Bld: 175 mg/dL — ABNORMAL HIGH (ref 70–99)
Potassium: 3.6 mmol/L (ref 3.5–5.1)
Sodium: 138 mmol/L (ref 135–145)

## 2017-12-30 LAB — URINALYSIS, ROUTINE W REFLEX MICROSCOPIC
Bilirubin Urine: NEGATIVE
Glucose, UA: NEGATIVE mg/dL
Hgb urine dipstick: NEGATIVE
Ketones, ur: NEGATIVE mg/dL
Leukocytes, UA: NEGATIVE
Nitrite: NEGATIVE
Protein, ur: NEGATIVE mg/dL
Specific Gravity, Urine: 1.015 (ref 1.005–1.030)
pH: 5 (ref 5.0–8.0)

## 2017-12-30 LAB — CREATININE, SERUM
Creatinine, Ser: 0.94 mg/dL (ref 0.61–1.24)
GFR calc Af Amer: >60 mL/min (ref >60)
GFR calc non Af Amer: >60 mL/min (ref >60)

## 2017-12-30 LAB — GLUCOSE, CAPILLARY: Glucose-Capillary: 188 mg/dL — ABNORMAL HIGH (ref 70–99)

## 2017-12-30 LAB — BRAIN NATRIURETIC PEPTIDE: B Natriuretic Peptide: 81 pg/mL (ref 0.0–100.0)

## 2017-12-30 MED ORDER — LEVOTHYROXINE SODIUM 25 MCG PO TABS
125.0000 ug | ORAL_TABLET | Freq: Every day | ORAL | Status: DC
  Administered 2018-01-01: 125 ug via ORAL
  Filled 2017-12-30 (×2): qty 1

## 2017-12-30 MED ORDER — FLUTICASONE FUROATE-VILANTEROL 100-25 MCG/INH IN AEPB
1.0000 | INHALATION_SPRAY | Freq: Every day | RESPIRATORY_TRACT | Status: DC
  Administered 2017-12-31 – 2018-01-01 (×2): 1 via RESPIRATORY_TRACT
  Filled 2017-12-30: qty 28

## 2017-12-30 MED ORDER — ASPIRIN EC 81 MG PO TBEC
81.0000 mg | DELAYED_RELEASE_TABLET | Freq: Every day | ORAL | Status: DC
  Administered 2017-12-31 – 2018-01-01 (×2): 81 mg via ORAL
  Filled 2017-12-30 (×2): qty 1

## 2017-12-30 MED ORDER — POTASSIUM CHLORIDE CRYS ER 20 MEQ PO TBCR
20.0000 meq | EXTENDED_RELEASE_TABLET | Freq: Every day | ORAL | Status: DC
  Administered 2017-12-31 – 2018-01-01 (×2): 20 meq via ORAL
  Filled 2017-12-30 (×2): qty 1

## 2017-12-30 MED ORDER — CANAGLIFLOZIN 100 MG PO TABS
100.0000 mg | ORAL_TABLET | Freq: Every day | ORAL | Status: DC
  Administered 2017-12-31 – 2018-01-01 (×2): 100 mg via ORAL
  Filled 2017-12-30 (×2): qty 1

## 2017-12-30 MED ORDER — ONDANSETRON HCL 4 MG/2ML IJ SOLN: 4.0000 mg | Freq: Four times a day (QID) | INTRAMUSCULAR | Status: DC | PRN

## 2017-12-30 MED ORDER — ATORVASTATIN CALCIUM 40 MG PO TABS
40.0000 mg | ORAL_TABLET | Freq: Every day | ORAL | Status: DC
  Administered 2017-12-31 – 2018-01-01 (×2): 40 mg via ORAL
  Filled 2017-12-30 (×2): qty 1

## 2017-12-30 MED ORDER — HEPARIN SODIUM (PORCINE) 5000 UNIT/ML IJ SOLN
5000.0000 [IU] | Freq: Three times a day (TID) | INTRAMUSCULAR | Status: DC
  Administered 2017-12-30 – 2018-01-01 (×4): 5000 [IU] via SUBCUTANEOUS
  Filled 2017-12-30 (×4): qty 1

## 2017-12-30 MED ORDER — IRBESARTAN 75 MG PO TABS
75.0000 mg | ORAL_TABLET | Freq: Every day | ORAL | Status: DC
  Administered 2017-12-31: 75 mg via ORAL
  Filled 2017-12-30 (×2): qty 1

## 2017-12-30 MED ORDER — ALBUTEROL SULFATE (2.5 MG/3ML) 0.083% IN NEBU: 3.0000 mL | INHALATION_SOLUTION | Freq: Four times a day (QID) | RESPIRATORY_TRACT | Status: DC | PRN

## 2017-12-30 MED ORDER — TORSEMIDE 20 MG PO TABS
40.0000 mg | ORAL_TABLET | Freq: Every day | ORAL | Status: DC
  Administered 2017-12-31 – 2018-01-01 (×2): 40 mg via ORAL
  Filled 2017-12-30 (×2): qty 2

## 2017-12-30 MED ORDER — ACETAMINOPHEN 325 MG PO TABS: 650.0000 mg | ORAL_TABLET | ORAL | Status: DC | PRN

## 2017-12-30 NOTE — ED Notes (Signed)
Plan of care unchanged unchanged. Awaiting bed assignment and transfer to [facility] [place].

## 2017-12-30 NOTE — ED Notes (Signed)
Oxygen sat 97% on ra

## 2017-12-30 NOTE — ED Provider Notes (Signed)
[facility] EMERGENCY DEPARTMENT Provider Note   CSN: [id number] Arrival date & time: 12/30/17  1456     History   Chief Complaint Chief Complaint  Patient presents with  . Dizziness  . Shortness of Breath    HPI Matthew May is a 72 y.o. male.  HPI  72 year old male, known history of coronary disease status post bypass grafting in 2011, history of COPD, carotid stenosis with approximately 40 to 60% occlusive lesions, history of hypertension hyperlipidemia and Diabetes Type 2 - he states that he has asked his doctors for O2 therapy for home stating that he is chronically SOB but has not qualified for it yet.  He presents today with SOB and a feeling of dizziness.  He reports that it occurred 4 days ago when he was driving - he had a feeling that he was going to pass out - like his vision was going black - this occurred on the road - he pulled over and took a few minutes to wait for it to pass - it eventually did and he started driving again - he does not have symptoms that severe today but has reported some mild "dizziness".  He also reports a chronic shortness of breath which may be a little bit worse lately.  He denies any abdominal discomfort and has no active chest pain, he does have some swelling in his lower extremities which is symmetrical.  He states this is somewhat chronic.  No fevers, no vomiting, no diarrhea, no constipation, no blood in the stools, no headaches, no visual changes.  This does not seem to be associated with eating exertion, standing or laying down.  Past Medical History:  Diagnosis Date  . CAD (coronary artery disease)    a. s/p NSTEMI 4/11 => s/p CABG (L-LAD, S-OM2, S-PDA/PL);  b.  ETT-Myoview 6/14:  normal study, no ischemia, EF 67%  . Carotid stenosis    Carotid U/S 7/56:  RICA 4-33%, LICA 29-51%; right vertebral flow retrograde suggestive of steel-consider PV consult  . COPD (chronic obstructive pulmonary disease) ([place])   . HLD (hyperlipidemia)   .  HTN (hypertension)   . Hx of echocardiogram    Echo 6/14: Mod LVH, focal basal hypertrophy, EF 50-55%, mild AS (mean 17 mmHg) and mild AI, mild to mod LAE, mild RAE  . Leg edema   . Obesity   . Renal insufficiency   . Subclavian artery stenosis, right ([place])    based upon carotid U/S done 11/2012    Patient Active Problem List   Diagnosis Date Noted  . Aortic stenosis 12/30/2017  . Cellulitis and abscess of left leg 01/18/2017  . Bilateral carotid artery stenosis 11/01/2016  . Aortic valve stenosis 11/01/2016  . Morbid obesity ([place]) 11/29/2015  . Trigeminy 05/23/2013  . Subclavian artery stenosis, right ([place]) 12/03/2012  . Diabetes mellitus, type 2 ([place]) 08/26/2012  . Vitamin D deficiency 08/26/2012  . Hypothyroidism 07/12/2011  . Hyperlipidemia associated with type 2 diabetes mellitus ([place]) 11/10/2009  . CORONARY ATHEROSCLEROSIS NATIVE CORONARY ARTERY 11/10/2009  . Hypertension associated with diabetes ([place]) 10/26/2009    Past Surgical History:  Procedure Laterality Date  . CORONARY ARTERY BYPASS GRAFT     2011, LIMA to LAD coronary artery, SVG to OM2 branch of lect circumflex coronary artery, and a sequential SVG to psot descening to posterolateral branches to RCA  . endoscopic vein harvesting     right leg   . HERNIA REPAIR    . TONSILLECTOMY  Home Medications    Prior to Admission medications   Medication Sig Start Date End Date Taking? Authorizing Provider  albuterol (PROVENTIL HFA;VENTOLIN HFA) 108 (90 Base) MCG/ACT inhaler Inhale 2 puffs into the lungs every 6 (six) hours as needed for wheezing or shortness of breath. 11/23/17  Yes Eustaquio Maize, MD  aspirin EC 81 MG tablet Take 81 mg by mouth daily.   Yes [provider]  atorvastatin (LIPITOR) 40 MG tablet TAKE 1 TABLET BY MOUTH  DAILY 10/26/17  Yes [name], Christy A, FNP  empagliflozin (JARDIANCE) 10 MG TABS tablet Take 10 mg by mouth daily. 11/27/17  Yes [name], Christy A, FNP  levothyroxine  (SYNTHROID, LEVOTHROID) 125 MCG tablet TAKE 1 TABLET BY MOUTH  DAILY 01/09/17  Yes [name], Alyse Low A, FNP  metFORMIN (GLUCOPHAGE) 1000 MG tablet TAKE 1 TABLET BY MOUTH TWO  TIMES DAILY 10/26/17  Yes [name], Christy A, FNP  nitroGLYCERIN (NITROSTAT) 0.4 MG SL tablet DISSOLVE ONE TABLET UNDER THE TONGUE EVERY 5 MINUTES AS NEEDED FOR CHEST PAIN.  DO NOT EXCEED A TOTAL OF 3 DOSES IN 15 MINUTES 04/24/17  Yes Sharion Balloon, FNP  [facility] DELICA LANCETS 93J MISC USE ONE DAILY 06/06/17  Yes Sharion Balloon, FNP  ONETOUCH VERIO test strip  03/04/17  Yes [provider]  ONETOUCH VERIO test strip TEST EVERY DAY 10/26/17  Yes Evelina Dun A, FNP  potassium chloride SA (K-DUR,KLOR-CON) 20 MEQ tablet TAKE 1 TABLET BY MOUTH  EVERY DAY 10/26/17  Yes Minus Breeding, MD  torsemide (DEMADEX) 20 MG tablet TAKE 2 TABLETS BY MOUTH  DAILY 10/26/17  Yes Evelina Dun A, FNP  valsartan (DIOVAN) 80 MG tablet TAKE 1 TABLET BY MOUTH  DAILY 10/26/17  Yes [name], Christy A, FNP  Vitamin D, Ergocalciferol, (DRISDOL) 50000 units CAPS capsule TAKE 1 CAPSULE BY MOUTH ONCE A WEEK 12/18/17  Yes [name], Christy A, FNP  azithromycin (ZITHROMAX) 250 MG tablet Take 2 the first day and then one each day after. Patient not taking: Reported on 12/30/2017 11/23/17   Eustaquio Maize, MD  fluticasone furoate-vilanterol (BREO ELLIPTA) 100-25 MCG/INH AEPB Inhale 1 puff into the lungs daily. 11/26/17   Sharion Balloon, FNP    Family History Family History  Problem Relation Age of Onset  . Heart disease Father   . Heart attack Father   . Mental illness Mother   . Mental illness Sister   . Diabetes Brother   . Depression Maternal Aunt     Social History Social History   Tobacco Use  . Smoking status: Former Smoker    Last attempt to quit: 07/11/2009    Years since quitting: 8.4  . Smokeless tobacco: Never Used  . Tobacco comment: smoked about 10 cig/day; used to smoke 2 ppd for many years   Substance Use Topics  . Alcohol use: No      Alcohol/week: 0.0 oz  . Drug use: No     Allergies   Prednisone and Lisinopril   Review of Systems Review of Systems  All other systems reviewed and are negative.    Physical Exam Updated Vital Signs BP 130/70   Pulse 87   Temp (!) 97.5 F (36.4 C) (Oral)   Resp (!) 22   Ht 5' 7.5" (1.715 m)   Wt 104 kg (229 lb 6 oz)   SpO2 94%   BMI 35.39 kg/m   Physical Exam  Constitutional: He appears well-developed and well-nourished. No distress.  HENT:  Head: Normocephalic  and atraumatic.  Mouth/Throat: Oropharynx is clear and moist. No oropharyngeal exudate.  Eyes: Pupils are equal, round, and reactive to light. Conjunctivae and EOM are normal. Right eye exhibits no discharge. Left eye exhibits no discharge. No scleral icterus.  Neck: Normal range of motion. Neck supple. No JVD present. No thyromegaly present.  Cardiovascular: Normal rate, regular rhythm and intact distal pulses. Exam reveals no gallop and no friction rub.  Murmur ( 2/6 systolic) heard. Pulmonary/Chest: Effort normal and breath sounds normal. No respiratory distress. He has no wheezes. He has no rales.  Lung sounds are clear, minimally tachypneic, but speaks in full sentences without accessory muscle use or increased work of breathing.  Abdominal: Soft. Bowel sounds are normal. He exhibits no distension and no mass. There is no tenderness.  Musculoskeletal: Normal range of motion. He exhibits edema ( Bilateral lower extremity edema, 1+, symmetrical). He exhibits no tenderness.  Lymphadenopathy:    He has no cervical adenopathy.  Neurological: He is alert. Coordination normal.  Skin: Skin is warm and dry. No rash noted. No erythema.  Psychiatric: He has a normal mood and affect. His behavior is normal.  Nursing note and vitals reviewed.    ED Treatments / Results  Labs (all labs ordered are listed, but only abnormal results are displayed) Labs Reviewed  BASIC METABOLIC PANEL - Abnormal; Notable for the  following components:      Result Value   Glucose, Bld 175 (*)    All other components within normal limits  CBC WITH DIFFERENTIAL/PLATELET  BRAIN NATRIURETIC PEPTIDE  TROPONIN I  URINALYSIS, ROUTINE W REFLEX MICROSCOPIC    EKG EKG Interpretation  Date/Time:  Sunday December 30 2017 15:09:02 EDT Ventricular Rate:  95 PR Interval:    QRS Duration: 118 QT Interval:  356 QTC Calculation: 448 R Axis:   77 Text Interpretation:  Sinus rhythm Multiple ventricular premature complexes Borderline prolonged PR interval with 1st degree A-V block Incomplete right bundle branch block Low voltage, precordial leads Probable anteroseptal infarct, old Since last tracing rate slower Confirmed by Noemi Chapel [phone]) on 12/30/2017 3:15:51 PM   Radiology Dg Chest Portable 1 View  Result Date: 12/30/2017 CLINICAL DATA:  Shortness of breath EXAM: PORTABLE CHEST 1 VIEW COMPARISON:  11/26/2017 FINDINGS: Cardiac shadow is stable. Postsurgical changes are again seen. Mild vascular congestion is noted without interstitial edema. No focal infiltrate or sizable effusion is noted. No bony abnormality is seen. IMPRESSION: Mild vascular congestion without interstitial edema. Electronically Signed   By: Inez Catalina M.D.   On: 12/30/2017 15:41    Procedures Procedures (including critical care time)  Medications Ordered in ED Medications - No data to display   Initial Impression / Assessment and Plan / ED Course  I have reviewed the triage vital signs and the nursing notes.  Pertinent labs & imaging results that were available during my care of the patient were reviewed by me and considered in my medical decision making (see chart for details).  Clinical Course as of Dec 31 1623  Sun Dec 30, 2017  1600 Chest x-ray reveals mild vascular congestion without any edema or infiltrates.  Diaphragm is normal-appearing without subdiaphragmatic air.  No soft tissue abnormalities.   [BM]  4034 Labs and chest x-ray are  all very unremarkable which is reassuring, the patient will be given albuterol treatment   [BM]    Clinical Course User Index [BM] Noemi Chapel, MD    The EKG shows no significant findings compared to prior EKGs,  he does have a first-degree AV block, nonspecific ST and T's, no signs of elevation or significant arrhythmia.  Per my EMR review, the pt had echo in 05/02/17 during which time he had an ejection fraction of 60 to 65% with aortic stenosis, systolic function was normal, wall motion was normal, diastolic function seem normal as well.  Bicuspid aortic valve with severe stenosis  This does raise concern for aortic stenosis in the presence of near syncope, it raises suspicion that this stenosis may be to the point where it needs to be repaired.  I discussed the case with Dr. Golden Hurter of the cardiology service who accepts transfer of the patient considering that this could be progressive and critical aortic stenosis causing the patient's near syncope.  Patient updated.  Final Clinical Impressions(s) / ED Diagnoses   Final diagnoses:  Near syncope  Aortic valve stenosis, etiology of cardiac valve disease unspecified      Noemi Chapel, MD 12/30/17 1625

## 2017-12-30 NOTE — H&P (Signed)
CARDIOLOGY H&P  HPI: Matthew May is a 72 y.o. male w/ carotid artery stenosis, DM2, HLD, HTN, subclavian stenosis, severe AS, and CAD s/p CABG (2011) who presents with presyncope.  In brief, the patient has a known history of severe aortic stenosis document of by echocardiogram in November 2018.  At this time he was asymptomatic and the decision was made to pursue watchful waiting.  The patient now presents with 1 to 2 weeks of worsening dizziness with presyncopal episodes.  He denies having had any falls or frank syncope, however does note that his worsening symptoms have been concerning to him.  He denies any symptoms of palpitations.  He denies chest pain, chest pressure.  He does endorse symptoms of shortness of breath with exertion.  This is been stable for several weeks.  He has been taking all of his medications as prescribed.  He has stable bilateral lower externally edema with no recent changes.  Review of Systems:     Cardiac Review of Systems: {Y] = yes [ ]  = no  Chest Pain [    ]  Resting SOB [   ] Exertional SOB  [ Y ]  Orthopnea [  ]   Pedal Edema [ Y ]    Palpitations [  ] Syncope  [  ]   Presyncope [   ]  General Review of Systems: [Y] = yes [  ]=no Constitional: recent weight change [  ]; anorexia [  ]; fatigue [  ]; nausea [  ]; night sweats [  ]; fever [  ]; or chills [  ];                                                                     Dental: poor dentition[  ];   Eye : blurred vision [  ]; diplopia [   ]; vision changes [  ];  Amaurosis fugax[  ]; Resp: cough [  ];  wheezing[  ];  hemoptysis[  ]; shortness of breath[ Y ]; paroxysmal nocturnal dyspnea[  ]; dyspnea on exertion[  ]; or orthopnea[  ];  GI:  gallstones[  ], vomiting[  ];  dysphagia[  ]; melena[  ];  hematochezia [  ]; heartburn[  ];   GU: kidney stones [  ]; hematuria[  ];   dysuria [  ];  nocturia[  ];               Skin: rash [  ], swelling[  ];, hair loss[  ];  peripheral edema[  ];  or itching[   ]; Musculosketetal: myalgias[  ];  joint swelling[  ];  joint erythema[  ];  joint pain[  ];  back pain[  ];  Heme/Lymph: bruising[  ];  bleeding[  ];  anemia[  ];  Neuro: TIA[  ];  headaches[  ];  stroke[  ];  vertigo[  ];  seizures[  ];   paresthesias[  ];  difficulty walking[  ];  Psych:depression[  ]; anxiety[  ];  Endocrine: diabetes[  ];  thyroid dysfunction[  ];  Other:  Past Medical History:  Diagnosis Date  . CAD (coronary artery disease)    a. s/p NSTEMI 4/11 => s/p CABG (L-LAD,  S-OM2, S-PDA/PL);  b.  ETT-Myoview 6/14:  normal study, no ischemia, EF 67%  . Carotid stenosis    Carotid U/S 9/67:  RICA 8-93%, LICA 81-01%; right vertebral flow retrograde suggestive of steel-consider PV consult  . COPD (chronic obstructive pulmonary disease) ([place])   . HLD (hyperlipidemia)   . HTN (hypertension)   . Hx of echocardiogram    Echo 6/14: Mod LVH, focal basal hypertrophy, EF 50-55%, mild AS (mean 17 mmHg) and mild AI, mild to mod LAE, mild RAE  . Leg edema   . Obesity   . Renal insufficiency   . Subclavian artery stenosis, right ([place])    based upon carotid U/S done 11/2012    Prior to Admission medications   Medication Sig Start Date End Date Taking? Authorizing Provider  albuterol (PROVENTIL HFA;VENTOLIN HFA) 108 (90 Base) MCG/ACT inhaler Inhale 2 puffs into the lungs every 6 (six) hours as needed for wheezing or shortness of breath. 11/23/17  Yes Eustaquio Maize, MD  aspirin EC 81 MG tablet Take 81 mg by mouth daily.   Yes [provider]  atorvastatin (LIPITOR) 40 MG tablet TAKE 1 TABLET BY MOUTH  DAILY 10/26/17  Yes [name], Christy A, FNP  empagliflozin (JARDIANCE) 10 MG TABS tablet Take 10 mg by mouth daily. 11/27/17  Yes [name], Christy A, FNP  levothyroxine (SYNTHROID, LEVOTHROID) 125 MCG tablet TAKE 1 TABLET BY MOUTH  DAILY 01/09/17  Yes [name], Alyse Low A, FNP  metFORMIN (GLUCOPHAGE) 1000 MG tablet TAKE 1 TABLET BY MOUTH TWO  TIMES DAILY 10/26/17  Yes [name], Christy A, FNP   nitroGLYCERIN (NITROSTAT) 0.4 MG SL tablet DISSOLVE ONE TABLET UNDER THE TONGUE EVERY 5 MINUTES AS NEEDED FOR CHEST PAIN.  DO NOT EXCEED A TOTAL OF 3 DOSES IN 15 MINUTES 04/24/17  Yes Sharion Balloon, FNP  [facility] DELICA LANCETS 75Z MISC USE ONE DAILY 06/06/17  Yes Sharion Balloon, FNP  ONETOUCH VERIO test strip  03/04/17  Yes [provider]  ONETOUCH VERIO test strip TEST EVERY DAY 10/26/17  Yes Evelina Dun A, FNP  potassium chloride SA (K-DUR,KLOR-CON) 20 MEQ tablet TAKE 1 TABLET BY MOUTH  EVERY DAY 10/26/17  Yes Minus Breeding, MD  torsemide (DEMADEX) 20 MG tablet TAKE 2 TABLETS BY MOUTH  DAILY 10/26/17  Yes Evelina Dun A, FNP  valsartan (DIOVAN) 80 MG tablet TAKE 1 TABLET BY MOUTH  DAILY 10/26/17  Yes [name], Christy A, FNP  Vitamin D, Ergocalciferol, (DRISDOL) 50000 units CAPS capsule TAKE 1 CAPSULE BY MOUTH ONCE A WEEK 12/18/17  Yes [name], Christy A, FNP  azithromycin (ZITHROMAX) 250 MG tablet Take 2 the first day and then one each day after. Patient not taking: Reported on 12/30/2017 11/23/17   Eustaquio Maize, MD  fluticasone furoate-vilanterol (BREO ELLIPTA) 100-25 MCG/INH AEPB Inhale 1 puff into the lungs daily. 11/26/17   Sharion Balloon, FNP      Allergies  Allergen Reactions  . Prednisone   . Lisinopril Cough    Social History   Socioeconomic History  . Marital status: Married    Spouse name: Not on file  . Number of children: 3  . Years of education: Not on file  . Highest education level: Not on file  Occupational History  . Occupation: Retired  Scientific laboratory technician  . Financial resource strain: Not very hard  . Food insecurity:    Worry: Never true    Inability: Never true  . Transportation needs:    Medical: No  Non-medical: No  Tobacco Use  . Smoking status: Former Smoker    Last attempt to quit: 07/11/2009    Years since quitting: 8.4  . Smokeless tobacco: Never Used  . Tobacco comment: smoked about 10 cig/day; used to smoke 2 ppd for many years    Substance and Sexual Activity  . Alcohol use: No    Alcohol/week: 0.0 oz  . Drug use: No  . Sexual activity: Never  Lifestyle  . Physical activity:    Days per week: 0 days    Minutes per session: Not on file  . Stress: Only a little  Relationships  . Social connections:    Talks on phone: More than three times a week    Gets together: More than three times a week    Attends religious service: Never    Active member of club or organization: No    Attends meetings of clubs or organizations: Never    Relationship status: Married  . Intimate partner violence:    Fear of current or ex partner: No    Emotionally abused: No    Physically abused: No    Forced sexual activity: No  Other Topics Concern  . Not on file  Social History Narrative   Lives with family    Family History  Problem Relation Age of Onset  . Heart disease Father   . Heart attack Father   . Mental illness Mother   . Mental illness Sister   . Diabetes Brother   . Depression Maternal Aunt     PHYSICAL EXAM: Vitals:   12/30/17 1911 12/30/17 2018  BP: 117/81 (!) 146/78  Pulse: 72 79  Resp: 17 15  Temp: 97.8 F (36.6 C) 97.7 F (36.5 C)  SpO2: 96% 96%   General:  Well appearing. No respiratory difficulty HEENT: normal Neck: supple. no JVD.  Cor: PMI nondisplaced. Regular rate & rhythm.  3/6 crescendo decrescendo murmur best heard at the right upper sternal border with absence of S2.  No gallops. Lungs: Crackles at the bilateral lung bases Abdomen: soft, nontender, nondistended. No hepatosplenomegaly. No bruits or masses. Good bowel sounds. 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 moves all 4 extremities w/o difficulty. Affect pleasant.  ECG: Normal sinus rhythm, heart rate 95 bpm, occasional PVCs,  incomplete right bundle branch block, anteroseptal Q waves, no ST or T wave changes consistent with acute ischemia  Results for orders placed or performed during the hospital encounter of 12/30/17 (from the past 24 hour(s))  Urinalysis, Routine w reflex microscopic     Status: None   Collection Time: 12/30/17  3:17 PM  Result Value Ref Range   Color, Urine YELLOW YELLOW   APPearance CLEAR CLEAR   Specific Gravity, Urine 1.015 1.005 - 1.030   pH 5.0 5.0 - 8.0   Glucose, UA NEGATIVE NEGATIVE mg/dL   Hgb urine dipstick NEGATIVE NEGATIVE   Bilirubin Urine NEGATIVE NEGATIVE   Ketones, ur NEGATIVE NEGATIVE mg/dL   Protein, ur NEGATIVE NEGATIVE mg/dL   Nitrite NEGATIVE NEGATIVE   Leukocytes, UA NEGATIVE NEGATIVE  CBC with Differential     Status: None   Collection Time: 12/30/17  3:24 PM  Result Value Ref Range   WBC 9.0 4.0 - 10.5 K/uL   RBC 4.48 4.22 - 5.81 MIL/uL   Hemoglobin 13.5 13.0 - 17.0  g/dL   HCT 40.4 39.0 - 52.0 %   MCV 90.2 78.0 - 100.0 fL   MCH 30.1 26.0 - 34.0 pg   MCHC 33.4 30.0 - 36.0 g/dL   RDW 13.3 11.5 - 15.5 %   Platelets 186 150 - 400 K/uL   Neutrophils Relative % 70 %   Neutro Abs 6.3 1.7 - 7.7 K/uL   Lymphocytes Relative 24 %   Lymphs Abs 2.1 0.7 - 4.0 K/uL   Monocytes Relative 4 %   Monocytes Absolute 0.4 0.1 - 1.0 K/uL   Eosinophils Relative 2 %   Eosinophils Absolute 0.2 0.0 - 0.7 K/uL   Basophils Relative 0 %   Basophils Absolute 0.0 0.0 - 0.1 K/uL  Basic metabolic panel     Status: Abnormal   Collection Time: 12/30/17  3:24 PM  Result Value Ref Range   Sodium 138 135 - 145 mmol/L   Potassium 3.6 3.5 - 5.1 mmol/L   Chloride 100 98 - 111 mmol/L   CO2 26 22 - 32 mmol/L   Glucose, Bld 175 (H) 70 - 99 mg/dL   BUN 16 8 - 23 mg/dL   Creatinine, Ser 0.93 0.61 - 1.24 mg/dL   Calcium 9.2 8.9 - 10.3 mg/dL   GFR calc non Af Amer >60 >60 mL/min   GFR calc Af Amer >60 >60 mL/min   Anion gap 12 5 - 15  Brain natriuretic peptide     Status: None   Collection  Time: 12/30/17  3:24 PM  Result Value Ref Range   B Natriuretic Peptide 81.0 0.0 - 100.0 pg/mL  Troponin I     Status: None   Collection Time: 12/30/17  3:24 PM  Result Value Ref Range   Troponin I <0.03 <0.03 ng/mL   Dg Chest Portable 1 View  Result Date: 12/30/2017 CLINICAL DATA:  Shortness of breath EXAM: PORTABLE CHEST 1 VIEW COMPARISON:  11/26/2017 FINDINGS: Cardiac shadow is stable. Postsurgical changes are again seen. Mild vascular congestion is noted without interstitial edema. No focal infiltrate or sizable effusion is noted. No bony abnormality is seen. IMPRESSION: Mild vascular congestion without interstitial edema. Electronically Signed   By: Inez Catalina M.D.   On: 12/30/2017 15:41    ASSESSMENT: Matthew May is a 72 y.o. male w/ carotid artery stenosis, DM2, HLD, HTN, subclavian stenosis, severe AS, and CAD s/p CABG (2011) who presents with presyncope in the setting of known severe aortic stenosis.  PLAN/DISCUSSION: #) Severe AS: Previously asymptomatic however now with symptoms concerning for progressive disease.  Patient has multiple risk factors which make him at least moderate risk from a surgical standpoint.  These include prior history of sternotomy and diabetes.  He may be a reasonable candidate for TAVR, however he has known PAD thus his femoral access will need to be better characterized. -Obtain echo in a.m. -N.p.o. past midnight for possible cath -Repeat troponin -Avoid nitrates -Continue aspirin 81 mg daily  #) CAD: -Continue aspirin as per above -No evidence of active ischemia  #) DM2:  -Hold metformin with possible need for coronary angiogram -Start canagliflozin -Nightly fingersticks  Marcie Mowers, MD Cardiology Fellow, PGY-6

## 2017-12-30 NOTE — ED Notes (Signed)
Pt with bilateral LE swelling as well.

## 2017-12-30 NOTE — ED Notes (Signed)
Pt sitting up to side of bed eating appropriate meal tray. Plan of care unchanged. Family to bedside.

## 2017-12-30 NOTE — ED Notes (Signed)
Heart rate 84

## 2017-12-30 NOTE — ED Triage Notes (Signed)
Pt with dizziness for past week, sob with exertion off and on-pt will not answer question for how long.  Pt denies cp.

## 2017-12-31 ENCOUNTER — Encounter (HOSPITAL_COMMUNITY): Payer: Self-pay | Admitting: Cardiology

## 2017-12-31 ENCOUNTER — Inpatient Hospital Stay (HOSPITAL_COMMUNITY): Payer: Medicare Other

## 2017-12-31 ENCOUNTER — Encounter (HOSPITAL_COMMUNITY)
Admission: EM | Disposition: A | Discharge status: Home / Self Care | Payer: Self-pay | Source: Home / Self Care | Attending: Cardiology

## 2017-12-31 DIAGNOSIS — I2510 Atherosclerotic heart disease of native coronary artery without angina pectoris: Secondary | ICD-10-CM

## 2017-12-31 DIAGNOSIS — E118 Type 2 diabetes mellitus with unspecified complications: Secondary | ICD-10-CM

## 2017-12-31 DIAGNOSIS — R55 Syncope and collapse: Secondary | ICD-10-CM

## 2017-12-31 DIAGNOSIS — I350 Nonrheumatic aortic (valve) stenosis: Principal | ICD-10-CM

## 2017-12-31 DIAGNOSIS — R42 Dizziness and giddiness: Secondary | ICD-10-CM

## 2017-12-31 HISTORY — PX: RIGHT/LEFT HEART CATH AND CORONARY/GRAFT ANGIOGRAPHY: CATH118267

## 2017-12-31 LAB — GLUCOSE, CAPILLARY
Glucose-Capillary: 172 mg/dL — ABNORMAL HIGH (ref 70–99)
Glucose-Capillary: 157 mg/dL — ABNORMAL HIGH (ref 70–99)
Glucose-Capillary: 133 mg/dL — ABNORMAL HIGH (ref 70–99)
Glucose-Capillary: 263 mg/dL — ABNORMAL HIGH (ref 70–99)

## 2017-12-31 LAB — BASIC METABOLIC PANEL
Anion gap: 10 (ref 5–15)
BUN: 11 mg/dL (ref 8–23)
CO2: 28 mmol/L (ref 22–32)
Calcium: 8.9 mg/dL (ref 8.9–10.3)
Chloride: 101 mmol/L (ref 98–111)
Creatinine, Ser: 0.82 mg/dL (ref 0.61–1.24)
GFR calc Af Amer: >60 mL/min (ref >60)
GFR calc non Af Amer: >60 mL/min (ref >60)
Glucose, Bld: 182 mg/dL — ABNORMAL HIGH (ref 70–99)
Potassium: 4 mmol/L (ref 3.5–5.1)
Sodium: 139 mmol/L (ref 135–145)

## 2017-12-31 LAB — POCT I-STAT 3, VENOUS BLOOD GAS (G3P V)
Acid-Base Excess: 4 mmol/L — ABNORMAL HIGH (ref 0.0–2.0)
Acid-Base Excess: 4 mmol/L — ABNORMAL HIGH (ref 0.0–2.0)
Bicarbonate: 28.5 mmol/L — ABNORMAL HIGH (ref 20.0–28.0)
Bicarbonate: 29.1 mmol/L — ABNORMAL HIGH (ref 20.0–28.0)
O2 Saturation: 66 %
O2 Saturation: 67 %
TCO2: 30 mmol/L (ref 22–32)
TCO2: 30 mmol/L (ref 22–32)
pCO2, Ven: 43.1 mmHg — ABNORMAL LOW (ref 44.0–60.0)
pCO2, Ven: 43.2 mmHg — ABNORMAL LOW (ref 44.0–60.0)
pH, Ven: 7.428 (ref 7.250–7.430)
pH, Ven: 7.436 — ABNORMAL HIGH (ref 7.250–7.430)
pO2, Ven: 34 mmHg (ref 32.0–45.0)
pO2, Ven: 34 mmHg (ref 32.0–45.0)

## 2017-12-31 LAB — CBC
HCT: 37.5 % — ABNORMAL LOW (ref 39.0–52.0)
Hemoglobin: 12.2 g/dL — ABNORMAL LOW (ref 13.0–17.0)
MCH: 29.5 pg (ref 26.0–34.0)
MCHC: 32.5 g/dL (ref 30.0–36.0)
MCV: 90.8 fL (ref 78.0–100.0)
Platelets: 178 10*3/uL (ref 150–400)
RBC: 4.13 MIL/uL — ABNORMAL LOW (ref 4.22–5.81)
RDW: 13.1 % (ref 11.5–15.5)
WBC: 8.1 10*3/uL (ref 4.0–10.5)

## 2017-12-31 LAB — POCT ACTIVATED CLOTTING TIME: Activated Clotting Time: 169 seconds

## 2017-12-31 LAB — POCT I-STAT 3, ART BLOOD GAS (G3+)
Acid-Base Excess: 3 mmol/L — ABNORMAL HIGH (ref 0.0–2.0)
Bicarbonate: 27.5 mmol/L (ref 20.0–28.0)
O2 Saturation: 95 %
TCO2: 29 mmol/L (ref 22–32)
pCO2 arterial: 38.9 mmHg (ref 32.0–48.0)
pH, Arterial: 7.457 — ABNORMAL HIGH (ref 7.350–7.450)
pO2, Arterial: 74 mmHg — ABNORMAL LOW (ref 83.0–108.0)

## 2017-12-31 LAB — MAGNESIUM: Magnesium: 1.9 mg/dL (ref 1.7–2.4)

## 2017-12-31 LAB — HIV ANTIBODY (ROUTINE TESTING W REFLEX): HIV Screen 4th Generation wRfx: NONREACTIVE

## 2017-12-31 SURGERY — RIGHT/LEFT HEART CATH AND CORONARY/GRAFT ANGIOGRAPHY
Anesthesia: LOCAL

## 2017-12-31 MED ORDER — MIDAZOLAM HCL 2 MG/2ML IJ SOLN
INTRAMUSCULAR | Status: AC
  Filled 2017-12-31: qty 2

## 2017-12-31 MED ORDER — LIDOCAINE HCL (PF) 1 % IJ SOLN
INTRAMUSCULAR | Status: DC | PRN
  Administered 2017-12-31 (×2): 2 mL

## 2017-12-31 MED ORDER — SODIUM CHLORIDE 0.9 % IV SOLN
INTRAVENOUS | Status: AC | PRN
  Administered 2017-12-31: 10 mL/h via INTRAVENOUS

## 2017-12-31 MED ORDER — VERAPAMIL HCL 2.5 MG/ML IV SOLN
INTRAVENOUS | Status: DC | PRN
  Administered 2017-12-31: 16:00:00 via INTRA_ARTERIAL

## 2017-12-31 MED ORDER — SODIUM CHLORIDE 0.9% FLUSH
3.0000 mL | Freq: Two times a day (BID) | INTRAVENOUS | Status: DC
  Administered 2018-01-01: 3 mL via INTRAVENOUS

## 2017-12-31 MED ORDER — SODIUM CHLORIDE 0.9 % IV SOLN
INTRAVENOUS | Status: DC
  Administered 2017-12-31: 250 mL via INTRAVENOUS

## 2017-12-31 MED ORDER — SODIUM CHLORIDE 0.9 % IV SOLN: 250.0000 mL | INTRAVENOUS | Status: DC | PRN

## 2017-12-31 MED ORDER — SODIUM CHLORIDE 0.9% FLUSH: 3.0000 mL | INTRAVENOUS | Status: DC | PRN

## 2017-12-31 MED ORDER — SODIUM CHLORIDE 0.9% FLUSH
3.0000 mL | Freq: Two times a day (BID) | INTRAVENOUS | Status: DC
  Administered 2017-12-31: 3 mL via INTRAVENOUS

## 2017-12-31 MED ORDER — ASPIRIN 81 MG PO CHEW: 81.0000 mg | CHEWABLE_TABLET | ORAL | Status: DC

## 2017-12-31 MED ORDER — VERAPAMIL HCL 2.5 MG/ML IV SOLN
INTRAVENOUS | Status: AC
  Filled 2017-12-31: qty 2

## 2017-12-31 MED ORDER — SODIUM CHLORIDE 0.9 % IV SOLN: INTRAVENOUS | Status: DC

## 2017-12-31 MED ORDER — HEPARIN (PORCINE) IN NACL 1000-0.9 UT/500ML-% IV SOLN
INTRAVENOUS | Status: AC
  Filled 2017-12-31: qty 1000

## 2017-12-31 MED ORDER — IOHEXOL 350 MG/ML SOLN
INTRAVENOUS | Status: DC | PRN
  Administered 2017-12-31: 120 mL via INTRA_ARTERIAL

## 2017-12-31 MED ORDER — HEPARIN SODIUM (PORCINE) 1000 UNIT/ML IJ SOLN
INTRAMUSCULAR | Status: DC | PRN
  Administered 2017-12-31: 5000 [IU] via INTRAVENOUS

## 2017-12-31 MED ORDER — MIDAZOLAM HCL 2 MG/2ML IJ SOLN
INTRAMUSCULAR | Status: DC | PRN
  Administered 2017-12-31: 1 mg via INTRAVENOUS

## 2017-12-31 MED ORDER — LIDOCAINE HCL (PF) 1 % IJ SOLN
INTRAMUSCULAR | Status: AC
  Filled 2017-12-31: qty 30

## 2017-12-31 MED ORDER — SODIUM CHLORIDE 0.9 % IV SOLN
INTRAVENOUS | Status: AC
  Administered 2017-12-31: 19:00:00 via INTRAVENOUS

## 2017-12-31 MED ORDER — HEPARIN SODIUM (PORCINE) 1000 UNIT/ML IJ SOLN
INTRAMUSCULAR | Status: AC
  Filled 2017-12-31: qty 1

## 2017-12-31 MED ORDER — HEPARIN (PORCINE) IN NACL 1000-0.9 UT/500ML-% IV SOLN
INTRAVENOUS | Status: DC | PRN
  Administered 2017-12-31 (×2): 500 mL

## 2017-12-31 SURGICAL SUPPLY — 17 items
CATH BALLN WEDGE 5F 110CM (CATHETERS) ×1 IMPLANT
CATH INFINITI 5 FR IM (CATHETERS) ×1 IMPLANT
CATH INFINITI 5 FR MPA2 (CATHETERS) ×1 IMPLANT
CATH INFINITI 5FR AL1 (CATHETERS) ×1 IMPLANT
CATH INFINITI 5FR MULTPACK ANG (CATHETERS) ×1 IMPLANT
DEVICE RAD COMP TR BAND LRG (VASCULAR PRODUCTS) ×1 IMPLANT
GLIDESHEATH SLEND A-KIT 6F 22G (SHEATH) ×1 IMPLANT
GUIDEWIRE .025 260CM (WIRE) ×1 IMPLANT
GUIDEWIRE INQWIRE 1.5J.035X260 (WIRE) IMPLANT
INQWIRE 1.5J .035X260CM (WIRE) ×2
KIT HEART LEFT (KITS) ×2 IMPLANT
PACK CARDIAC CATHETERIZATION (CUSTOM PROCEDURE TRAY) ×2 IMPLANT
SHEATH GLIDE SLENDER 4/5FR (SHEATH) ×1 IMPLANT
SHIELD RADPAD SCOOP 12X17 (MISCELLANEOUS) ×1 IMPLANT
TRANSDUCER W/STOPCOCK (MISCELLANEOUS) ×2 IMPLANT
TUBING CIL FLEX 10 FLL-RA (TUBING) ×2 IMPLANT
WIRE EMERALD ST .035X260CM (WIRE) ×1 IMPLANT

## 2017-12-31 NOTE — Interval H&P Note (Signed)
History and Physical Interval Note:  12/31/2017 4:00 PM  Matthew May  has presented today for surgery, with the diagnosis of severe symptomatic aortic stenosis. The various methods of treatment have been discussed with the patient and family. After consideration of risks, benefits and other options for treatment, the patient has consented to  Procedure(s): RIGHT/LEFT HEART CATH AND CORONARY/GRAFT ANGIOGRAPHY (N/A) as a surgical intervention .  The patient's history has been reviewed, patient examined, no change in status, stable for surgery.  I have reviewed the patient's chart and labs.  Questions were answered to the patient's satisfaction.     Glenetta Hew

## 2017-12-31 NOTE — H&P (View-Only) (Signed)
Progress Note  Patient Name: Matthew May Date of Encounter: 12/31/2017  Primary Cardiologist: Minus Breeding, MD   Subjective   Persistent dizziness. Stable intermittent dyspnea. No chest pain.   Inpatient Medications    Scheduled Meds: . aspirin EC  81 mg Oral Daily  . atorvastatin  40 mg Oral Daily  . canagliflozin  100 mg Oral QAC breakfast  . fluticasone furoate-vilanterol  1 puff Inhalation Daily  . heparin  5,000 Units Subcutaneous Q8H  . irbesartan  75 mg Oral Daily  . levothyroxine  125 mcg Oral QAC breakfast  . potassium chloride SA  20 mEq Oral Daily  . torsemide  40 mg Oral Daily   Continuous Infusions:  PRN Meds: acetaminophen, albuterol, ondansetron (ZOFRAN) IV   Vital Signs    Vitals:   12/30/17 2013 12/30/17 2018 12/31/17 0629 12/31/17 0744  BP:  (!) 146/78 131/80   Pulse:  79 71   Resp:  15    Temp:  97.7 F (36.5 C) 97.7 F (36.5 C)   TempSrc:  Oral Oral   SpO2:  96% 96% 97%  Weight: 232 lb 14.4 oz (105.6 kg)  230 lb (104.3 kg)   Height: 5' 7.5" (1.715 m)       Intake/Output Summary (Last 24 hours) at 12/31/2017 0801 Last data filed at 12/31/2017 0659 Gross per 24 hour  Intake 591 ml  Output 350 ml  Net 241 ml   Filed Weights   12/30/17 1503 12/30/17 2013 12/31/17 0629  Weight: 229 lb 6 oz (104 kg) 232 lb 14.4 oz (105.6 kg) 230 lb (104.3 kg)    Telemetry    SR with frequent PVCs and Ventri Trigemeny- Personally Reviewed  ECG    SR with prolonged PR interval, PVCs - Personally Reviewed  Physical Exam   GEN: No acute distress.   Neck: No JVD Cardiac: RRR, 3/6 systolic murmurs, rubs, or gallops.  Respiratory: Clear to auscultation bilaterally. GI: Soft, nontender, non-distended  MS: 1-2 + BL LE edema; No deformity. Neuro:  Nonfocal  Psych: Normal affect   Labs    Chemistry Recent Labs  Lab 12/30/17 1524 12/30/17 2215 12/31/17 0428  NA 138  --  139  K 3.6  --  4.0  CL 100  --  101  CO2 26  --  28  GLUCOSE 175*   --  182*  BUN 16  --  11  CREATININE 0.93 0.94 0.82  CALCIUM 9.2  --  8.9  GFRNONAA >60 >60 >60  GFRAA >60 >60 >60  ANIONGAP 12  --  10     Hematology Recent Labs  Lab 12/30/17 1524 12/30/17 2215 12/31/17 0428  WBC 9.0 8.9 8.1  RBC 4.48 4.20* 4.13*  HGB 13.5 12.5* 12.2*  HCT 40.4 37.9* 37.5*  MCV 90.2 90.2 90.8  MCH 30.1 29.8 29.5  MCHC 33.4 33.0 32.5  RDW 13.3 13.0 13.1  PLT 186 172 178    Cardiac Enzymes Recent Labs  Lab 12/30/17 1524  TROPONINI <0.03     BNP Recent Labs  Lab 12/30/17 1524  BNP 81.0     Radiology    Dg Chest Portable 1 View  Result Date: 12/30/2017 CLINICAL DATA:  Shortness of breath EXAM: PORTABLE CHEST 1 VIEW COMPARISON:  11/26/2017 FINDINGS: Cardiac shadow is stable. Postsurgical changes are again seen. Mild vascular congestion is noted without interstitial edema. No focal infiltrate or sizable effusion is noted. No bony abnormality is seen. IMPRESSION: Mild vascular congestion without interstitial  edema. Electronically Signed   By: Inez Catalina M.D.   On: 12/30/2017 15:41    Cardiac Studies   Pending echo   Patient Profile     Matthew May is a 72 y.o. male w/ carotid artery stenosis, DM2, HLD, HTN, subclavian stenosis, severe AS, and CAD s/p CABG (2011) who presents with worsening dizziness with presyncope 1-2 weeks in the setting of known severe aortic stenosis. No LOC.   Assessment & Plan    1. Severe AS -Presenting with symptoms concerning for progression of disease. Pending repeat echo. NPO for cath. Dr. Percival Spanish to see.   2. CAD - No recent chest pain. Troponin negative. Continue ASA, ARB and statin.   3. Carotid artery stenosis - last study 11/2016 showed moderate distal common carotid plaque. Continue ASA and statin.   4. DM - Metformin on hold.   For questions or updates, please contact [facility] Please consult [email or web address] for contact info under Cardiology/STEMI.      Signed, Leanor Kail, PA    12/31/2017, 8:01 AM    History and all data above reviewed.  Patient examined.  I agree with the findings as above. He has had progressive dizziness as his main complaint.  He also has increased dyspnea.   The patient exam reveals COR:RRR, late peaking systolic murmur  ,  Lungs: clear  ,  Abd: Positive bowel sounds, no rebound no guarding, Ext no edema  .  All available labs, radiology testing, previous records reviewed. Agree with documented assessment and plan. AS:  Right and left cath today.  Mild vascular congestion on XRay.  CAD:  Coronary angiography.  DM:  Metformin on hold.      Jeneen Rinks [facility]  9:47 AM  12/31/2017

## 2017-12-31 NOTE — Progress Notes (Addendum)
Progress Note  Patient Name: Matthew May Date of Encounter: 12/31/2017  Primary Cardiologist: Minus Breeding, MD   Subjective   Persistent dizziness. Stable intermittent dyspnea. No chest pain.   Inpatient Medications    Scheduled Meds: . aspirin EC  81 mg Oral Daily  . atorvastatin  40 mg Oral Daily  . canagliflozin  100 mg Oral QAC breakfast  . fluticasone furoate-vilanterol  1 puff Inhalation Daily  . heparin  5,000 Units Subcutaneous Q8H  . irbesartan  75 mg Oral Daily  . levothyroxine  125 mcg Oral QAC breakfast  . potassium chloride SA  20 mEq Oral Daily  . torsemide  40 mg Oral Daily   Continuous Infusions:  PRN Meds: acetaminophen, albuterol, ondansetron (ZOFRAN) IV   Vital Signs    Vitals:   12/30/17 2013 12/30/17 2018 12/31/17 0629 12/31/17 0744  BP:  (!) 146/78 131/80   Pulse:  79 71   Resp:  15    Temp:  97.7 F (36.5 C) 97.7 F (36.5 C)   TempSrc:  Oral Oral   SpO2:  96% 96% 97%  Weight: 232 lb 14.4 oz (105.6 kg)  230 lb (104.3 kg)   Height: 5' 7.5" (1.715 m)       Intake/Output Summary (Last 24 hours) at 12/31/2017 0801 Last data filed at 12/31/2017 0659 Gross per 24 hour  Intake 591 ml  Output 350 ml  Net 241 ml   Filed Weights   12/30/17 1503 12/30/17 2013 12/31/17 0629  Weight: 229 lb 6 oz (104 kg) 232 lb 14.4 oz (105.6 kg) 230 lb (104.3 kg)    Telemetry    SR with frequent PVCs and Ventri Trigemeny- Personally Reviewed  ECG    SR with prolonged PR interval, PVCs - Personally Reviewed  Physical Exam   GEN: No acute distress.   Neck: No JVD Cardiac: RRR, 3/6 systolic murmurs, rubs, or gallops.  Respiratory: Clear to auscultation bilaterally. GI: Soft, nontender, non-distended  MS: 1-2 + BL LE edema; No deformity. Neuro:  Nonfocal  Psych: Normal affect   Labs    Chemistry Recent Labs  Lab 12/30/17 1524 12/30/17 2215 12/31/17 0428  NA 138  --  139  K 3.6  --  4.0  CL 100  --  101  CO2 26  --  28  GLUCOSE 175*   --  182*  BUN 16  --  11  CREATININE 0.93 0.94 0.82  CALCIUM 9.2  --  8.9  GFRNONAA >60 >60 >60  GFRAA >60 >60 >60  ANIONGAP 12  --  10     Hematology Recent Labs  Lab 12/30/17 1524 12/30/17 2215 12/31/17 0428  WBC 9.0 8.9 8.1  RBC 4.48 4.20* 4.13*  HGB 13.5 12.5* 12.2*  HCT 40.4 37.9* 37.5*  MCV 90.2 90.2 90.8  MCH 30.1 29.8 29.5  MCHC 33.4 33.0 32.5  RDW 13.3 13.0 13.1  PLT 186 172 178    Cardiac Enzymes Recent Labs  Lab 12/30/17 1524  TROPONINI <0.03     BNP Recent Labs  Lab 12/30/17 1524  BNP 81.0     Radiology    Dg Chest Portable 1 View  Result Date: 12/30/2017 CLINICAL DATA:  Shortness of breath EXAM: PORTABLE CHEST 1 VIEW COMPARISON:  11/26/2017 FINDINGS: Cardiac shadow is stable. Postsurgical changes are again seen. Mild vascular congestion is noted without interstitial edema. No focal infiltrate or sizable effusion is noted. No bony abnormality is seen. IMPRESSION: Mild vascular congestion without interstitial  edema. Electronically Signed   By: Inez Catalina M.D.   On: 12/30/2017 15:41    Cardiac Studies   Pending echo   Patient Profile     Matthew May is a 72 y.o. male w/ carotid artery stenosis, DM2, HLD, HTN, subclavian stenosis, severe AS, and CAD s/p CABG (2011) who presents with worsening dizziness with presyncope 1-2 weeks in the setting of known severe aortic stenosis. No LOC.   Assessment & Plan    1. Severe AS -Presenting with symptoms concerning for progression of disease. Pending repeat echo. NPO for cath. Dr. Percival Spanish to see.   2. CAD - No recent chest pain. Troponin negative. Continue ASA, ARB and statin.   3. Carotid artery stenosis - last study 11/2016 showed moderate distal common carotid plaque. Continue ASA and statin.   4. DM - Metformin on hold.   For questions or updates, please contact [facility] Please consult [email or web address] for contact info under Cardiology/STEMI.      Signed, Leanor Kail, PA    12/31/2017, 8:01 AM    History and all data above reviewed.  Patient examined.  I agree with the findings as above. He has had progressive dizziness as his main complaint.  He also has increased dyspnea.   The patient exam reveals COR:RRR, late peaking systolic murmur  ,  Lungs: clear  ,  Abd: Positive bowel sounds, no rebound no guarding, Ext no edema  .  All available labs, radiology testing, previous records reviewed. Agree with documented assessment and plan. AS:  Right and left cath today.  Mild vascular congestion on XRay.  CAD:  Coronary angiography.  DM:  Metformin on hold.      Matthew May [facility]  9:47 AM  12/31/2017

## 2018-01-01 ENCOUNTER — Other Ambulatory Visit: Payer: Self-pay | Admitting: Cardiology

## 2018-01-01 ENCOUNTER — Encounter (HOSPITAL_COMMUNITY): Payer: Self-pay | Admitting: Cardiology

## 2018-01-01 ENCOUNTER — Other Ambulatory Visit (HOSPITAL_COMMUNITY): Payer: Medicare Other

## 2018-01-01 DIAGNOSIS — R0602 Shortness of breath: Secondary | ICD-10-CM

## 2018-01-01 DIAGNOSIS — R0902 Hypoxemia: Secondary | ICD-10-CM

## 2018-01-01 DIAGNOSIS — R55 Syncope and collapse: Principal | ICD-10-CM

## 2018-01-01 DIAGNOSIS — Z0289 Encounter for other administrative examinations: Secondary | ICD-10-CM

## 2018-01-01 DIAGNOSIS — R42 Dizziness and giddiness: Secondary | ICD-10-CM

## 2018-01-01 LAB — GLUCOSE, CAPILLARY
Glucose-Capillary: 161 mg/dL — ABNORMAL HIGH (ref 70–99)
Glucose-Capillary: 257 mg/dL — ABNORMAL HIGH (ref 70–99)

## 2018-01-01 NOTE — Progress Notes (Signed)
Discharge instruction was given to pt.  Belongings were sent home with pt.  Deangleo Passage, RN 

## 2018-01-01 NOTE — Progress Notes (Addendum)
Inpatient Diabetes Program Recommendations  AACE/ADA: New Consensus Statement on Inpatient Glycemic Control (2015)  Target Ranges:  Prepandial:   less than 140 mg/dL      Peak postprandial:   less than 180 mg/dL (1-2 hours)      Critically ill patients:  140 - 180 mg/dL   Results for Matthew May, Matthew May (MRN [id number]) as of 01/01/2018 08:54  Ref. Range 12/31/2017 07:51 12/31/2017 12:14 12/31/2017 15:05 12/31/2017 21:23 01/01/2018 07:32  Glucose-Capillary Latest Ref Range: 70 - 99 mg/dL 172 (H) 157 (H) 133 (H) 263 (H) 161 (H)   Review of Glycemic Control  Diabetes history: DM 2  Outpatient Diabetes medications: Jardiance 10 mg Daily, Metformin 1000 mg BID Current orders for Inpatient glycemic control: Invokana 100 mg Daily  Inpatient Diabetes Program Recommendations:    While in the hospital consider Novolog 0-15 units Correction scale tid. Oral medications are not recommended due to difficulty in titration, if needed, and risk of hypoglycemia.  Consider an A1c level to determine glucose control over the past 2-3 months.  Thanks,  Tama Headings RN, MSN, BC-ADM Inpatient Diabetes Coordinator Team Pager [phone] (8a-5p)

## 2018-01-01 NOTE — Discharge Instructions (Signed)
Aortic Valve Stenosis Aortic valve stenosis is a narrowing of the aortic valve. The aortic valve opens and closes to regulate blood flow between the lower left chamber of the heart (left ventricle) and the blood vessel that leads away from the heart (aorta). When the aortic valve becomes narrow, it makes it difficult for the heart to pump blood into the aorta, which causes the heart to work harder. The extra work can weaken the heart over time. Aortic valve stenosis can range from mild to severe. If untreated, it can become more severe over time and can lead to heart failure. What are the causes? This condition may be caused by:  Buildup of calcium around and on the valve. This can occur with aging. This is the most common cause of aortic valve stenosis.  Birth defect.  Rheumatic fever.  Radiation to the chest. What increases the risk? You may be more likely to develop this condition if:  You are over the age of 65.  You were born with an abnormal bicuspid valve. What are the signs or symptoms? You may have no symptoms until your condition becomes severe. It may take 10-20 years for mild or moderate aortic valve stenosis to become severe. Symptoms may include:  Shortness of breath. This may get worse during physical activity.  Feeling unusually weak and tired (fatigue).  Extreme discomfort in the chest, neck, or arm (angina).  A heartbeat that is irregular or faster than normal (palpitations).  Dizziness or fainting. This may happen when you get physically tired or after you take certain heart medicines, such as nitroglycerin. How is this diagnosed? This condition may be diagnosed with:  A physical exam.  Echocardiogram. This is a type of imaging test that uses sound waves (ultrasound) to make an image of your heart. There are two types that may be used:  Transthoracic echocardiogram (TTE). This type of echocardiogram is noninvasive, and it is usually done  first.  Transesophageal echocardiogram (TEE). This type of echocardiogram is done by passing a flexible tube down your esophagus. The heart and the esophagus are close to each other, so your health care provider can take very clear, detailed pictures of the heart using this type of test.  Cardiac catheterization. In this procedure, a thin, flexible tube (catheter) is passed through a large vein in your neck, groin, or arm. This procedure provides information about arteries, structures, blood pressure, and oxygen levels in your heart.  Electrocardiogram (ECG). This records the electrical impulses of your heart and assesses heart function.  Stress tests. These are tests that evaluate the blood supply to your heart and your heart's response to exercise.  Blood tests. You may work with a health care provider who specializes in the heart (cardiologist). How is this treated? Treatment depends on how severe your condition is and what your symptoms are. You will need to have your heart checked regularly to make sure that your condition is not getting worse or causing serious problems. If your condition is mild, no treatment may be needed. Treatment may include:  Medicines that help keep your heart rate regular.  Medicines that thin your blood (anticoagulants) to prevent the formation of blood clots.  Antibiotic medicines to help prevent infection.  Surgery to replace your aortic valve. This is the most common treatment for aortic valve stenosis. Several types of surgeries are available. The surgery may be done through a large incision over your heart (open heart surgery), or it may be done using a minimally   invasive technique (transcatheter aortic valve replacement, or TAVR). Follow these instructions at home: Lifestyle    Limit alcohol intake to no more than 1 drink per day for nonpregnant women and 2 drinks per day for men. One drink equals 12 oz of beer, 5 oz of wine, or 1 oz of hard  liquor.  Do not use any tobacco products, such as cigarettes, chewing tobacco, or e-cigarettes. If you need help quitting, ask your health care provider.  Work with your health care provider to manage your blood pressure and cholesterol.  Maintain a healthy weight. Eating and drinking   Follow instructions from your health care provider about eating or drinking restrictions.  Limit how much caffeine you drink. Caffeine can affect your heart's rate and rhythm.  Drink enough fluid to keep your urine clear or pale yellow.  Eat a heart-healthy diet. This should include plenty of fresh fruits and vegetables. If you eat meat, it should be lean cuts. Avoid foods that are:  High in salt, saturated fat, or sugar.  Canned or highly processed.  Fried. Activity   Return to your normal activities as told by your health care provider. Ask your health care provider what activities are safe for you.  Exercise regularly, as told by your health care provider. Ask your health care provider what types of exercise are safe for you.  If your aortic valve stenosis is mild, you may need to avoid only very intense physical activity. The more severe your aortic valve stenosis is, the more activities you may need to avoid. General instructions   Take over-the-counter and prescription medicines only as told by your health care provider.  If you are a woman and you plan to become pregnant, talk with your health care provider before you become pregnant.  Tell all health care providers who care for you that you have aortic valve stenosis.  Keep all follow-up visits as told by your health care provider. This is important. Contact a health care provider if:  You have a fever. Get help right away if:  You develop chest pain or tightness.  You develop shortness of breath or difficulty breathing.  You feel light-headed.  You feel like you might faint.  Your heartbeat is irregular or faster than  normal. These symptoms may represent a serious problem that is an emergency. Do not wait to see if the symptoms will go away. Get medical help right away. Call your local emergency services (911 in the [place] Do not drive yourself to the hospital. This information is not intended to replace advice given to you by your health care provider. Make sure you discuss any questions you have with your health care provider. Document Released: 02/18/2003 Document Revised: 10/28/2015 Document Reviewed: 04/25/2015 Elsevier Interactive Patient Education  2017 [employer]  

## 2018-01-01 NOTE — Progress Notes (Signed)
Progress Note  Patient Name: Matthew May Date of Encounter: 01/01/2018  Primary Cardiologist:   Minus Breeding, MD   Subjective   No chest pain.  He has been walking around in the room without SOB.  No pain.   Inpatient Medications    Scheduled Meds: . aspirin EC  81 mg Oral Daily  . atorvastatin  40 mg Oral Daily  . canagliflozin  100 mg Oral QAC breakfast  . fluticasone furoate-vilanterol  1 puff Inhalation Daily  . heparin  5,000 Units Subcutaneous Q8H  . irbesartan  75 mg Oral Daily  . levothyroxine  125 mcg Oral QAC breakfast  . potassium chloride SA  20 mEq Oral Daily  . sodium chloride flush  3 mL Intravenous Q12H  . torsemide  40 mg Oral Daily   Continuous Infusions: . sodium chloride     PRN Meds: sodium chloride, acetaminophen, albuterol, ondansetron (ZOFRAN) IV, sodium chloride flush   Vital Signs    Vitals:   12/31/17 1944 12/31/17 2122 01/01/18 0510 01/01/18 0800  BP:  92/61 107/69 (!) 89/60  Pulse: 79 70 72 70  Resp: (!) 22 19  19   Temp:  98.1 F (36.7 C) 97.6 F (36.4 C)   TempSrc:  Oral Oral   SpO2: 94% 96% 95% 95%  Weight:   227 lb 8 oz (103.2 kg)   Height:        Intake/Output Summary (Last 24 hours) at 01/01/2018 0935 Last data filed at 01/01/2018 0802 Gross per 24 hour  Intake 362.1 ml  Output 1925 ml  Net -1562.9 ml   Filed Weights   12/30/17 2013 12/31/17 0629 01/01/18 0510  Weight: 232 lb 14.4 oz (105.6 kg) 230 lb (104.3 kg) 227 lb 8 oz (103.2 kg)    Telemetry    NSR, ventricular ectopy - Personally Reviewed  ECG    NA - Personally Reviewed  Physical Exam   GEN: No acute distress.   Neck: No  JVD Cardiac: RRR, 3/6 apical systolic murmur, no diastolic murmurs, rubs, or gallops.  Respiratory: Clear  to auscultation bilaterally. GI: Soft, nontender, non-distended  MS: No  edema; No deformity. Neuro:  Nonfocal  Ext:  Left radial without bleeding.  Psych: Normal affect   Labs    Chemistry Recent Labs  Lab  12/30/17 1524 12/30/17 2215 12/31/17 0428  NA 138  --  139  K 3.6  --  4.0  CL 100  --  101  CO2 26  --  28  GLUCOSE 175*  --  182*  BUN 16  --  11  CREATININE 0.93 0.94 0.82  CALCIUM 9.2  --  8.9  GFRNONAA >60 >60 >60  GFRAA >60 >60 >60  ANIONGAP 12  --  10     Hematology Recent Labs  Lab 12/30/17 1524 12/30/17 2215 12/31/17 0428  WBC 9.0 8.9 8.1  RBC 4.48 4.20* 4.13*  HGB 13.5 12.5* 12.2*  HCT 40.4 37.9* 37.5*  MCV 90.2 90.2 90.8  MCH 30.1 29.8 29.5  MCHC 33.4 33.0 32.5  RDW 13.3 13.0 13.1  PLT 186 172 178    Cardiac Enzymes Recent Labs  Lab 12/30/17 1524  TROPONINI <0.03   No results for input(s): TROPIPOC in the last 168 hours.   BNP Recent Labs  Lab 12/30/17 1524  BNP 81.0     DDimer No results for input(s): DDIMER in the last 168 hours.   Radiology    Dg Chest Portable 1 View  Result  Date: 12/30/2017 CLINICAL DATA:  Shortness of breath EXAM: PORTABLE CHEST 1 VIEW COMPARISON:  11/26/2017 FINDINGS: Cardiac shadow is stable. Postsurgical changes are again seen. Mild vascular congestion is noted without interstitial edema. No focal infiltrate or sizable effusion is noted. No bony abnormality is seen. IMPRESSION: Mild vascular congestion without interstitial edema. Electronically Signed   By: Inez Catalina M.D.   On: 12/30/2017 15:41    Cardiac Studies   Cardiac cath:   Prox RCA to Dist RCA lesion is 100% stenosed.  Seq SVG- RPDA-RPL graft was visualized by angiography and is large. The graft exhibits no disease.  Prox Cx lesion is 95% stenosed. Mid Cx lesion is 100% stenosed beyond OM2  SVG-Cx graft was visualized by angiography and is normal in caliber. Origin to Prox Graft lesion is 100% stenosed - flush  Mid LAD lesion is 100% stenosed.  LIMA-LAD graft was visualized by angiography and is normal in caliber. The graft exhibits no disease.  Lat 1st Diag lesion is 90% stenosed.  RHC NUMBERS reviewed: LV end diastolic pressure is normal.- 12  mmHg; LV end diastolic pressure is normal.  There is moderate aortic valve stenosis.    Moderate aortic stenosis by catheterization, however there is significant respiratory variation and  beat-to-beat variation of blood pressures making it very difficult to Adequately Assess.  Severe native CAD with occluded mid RCA, subtotal occluded proximal circumflex after small OM branch, totally occluded LAD after D2.    Widely patent LIMA-LAD -Just after D2  Widely patent sequential SVG-RPDA-RPL1  Flush occlusion of SVG-OM   Right Heart Pressures PA P: 25/11 mmHg-mean 17 mmHg. PCWP: 9 mmHg. LV EDP is normal. LVP -EDP: 195/3 mmHg, 14 mmHg; 173/0 mmHg-14 million mercury  Right Atrium The right atrium is moderately dilated. Right atrial pressure is normal. RAP 4 mmHg  Right Ventricle The right ventricle is mildly dilated. RVP-EDP: 34/0 mmHg - 4 mmHg    Patient Profile     72 y.o. male w/ carotid artery stenosis, DM2, HLD, HTN, subclavian stenosis, severe AS, and CAD s/p CABG (2011)who presents with worsening dizziness with presyncope 1-2 weeks in the setting of known severe aortic stenosis. No LOC.    Assessment & Plan    AS:  By cath the mean gradient was not particularly elevated.  However, Dr. Ellyn Hack indicated some difficulty with measurements secondary to "BP variation"   Mean gradient on echo in Nov however, correlates with this this reading.  BNP was not elevated.  This would argue against the AS as being the culprit for dizziness and SOB.   I am going to ambulate him in the hallway and check his rhythm and sats.  Provided he does OK with this he could go home with plans for out patient PFTs and an event monitor.    CAD:  Cath films personally reviewed.  Medical management.    DM:    No recent A1C.  I will defer to out patient management by Sharion Balloon, FNP   For questions or updates, please contact [facility] Please consult [email or web address] for contact info under  Cardiology/STEMI.   Signed, Minus Breeding, MD  01/01/2018, 9:35 AM

## 2018-01-01 NOTE — Discharge Summary (Signed)
Discharge Summary    Patient ID: Matthew May,  MRN: [id number], DOB/AGE: [birth year] 72 y.o.  Admit date: 12/30/2017 Discharge date: 01/01/2018  Primary Care Provider: Evelina Dun A Primary Cardiologist: Minus Breeding, MD  Discharge Diagnoses    Active Problems:   Aortic stenosis   Postural dizziness with presyncope   Near syncope   Allergies Allergies  Allergen Reactions  . Prednisone   . Lisinopril Cough    Diagnostic Studies/Procedures    RIGHT/LEFT HEART CATH AND CORONARY/GRAFT ANGIOGRAPHY  12/31/17  Conclusion    Prox RCA to Dist RCA lesion is 100% stenosed.  Seq SVG- RPDA-RPL graft was visualized by angiography and is large. The graft exhibits no disease.  Prox Cx lesion is 95% stenosed. Mid Cx lesion is 100% stenosed beyond OM2  SVG-Cx graft was visualized by angiography and is normal in caliber. Origin to Prox Graft lesion is 100% stenosed - flush  Mid LAD lesion is 100% stenosed.  LIMA-LAD graft was visualized by angiography and is normal in caliber. The graft exhibits no disease.  Lat 1st Diag lesion is 90% stenosed.  RHC NUMBERS reviewed: LV end diastolic pressure is normal.- 12 mmHg; LV end diastolic pressure is normal.  There is moderate aortic valve stenosis.    Moderate aortic stenosis by catheterization, however there is significant respiratory variation and  beat-to-beat variation of blood pressures making it very difficult to Adequately Assess.  Severe native CAD with occluded mid RCA, subtotal occluded proximal circumflex after small OM branch, totally occluded LAD after D2.    Widely patent LIMA-LAD -Just after D2  Widely patent sequential SVG-RPDA-RPL1  Flush occlusion of SVG-OM   Right Heart Pressures PA P: 25/11 mmHg-mean 17 mmHg. PCWP: 9 mmHg. LV EDP is normal. LVP -EDP: 195/3 mmHg, 14 mmHg; 173/0 mmHg-14 million mercury  Right Atrium The right atrium is moderately dilated. Right atrial pressure is normal. RAP 4 mmHg    Right Ventricle The right ventricle is mildly dilated. RVP-EDP: 34/0 mmHg - 4 mmHg    Recommend Aspirin 81mg  daily for severe native CAD.  Plan: Return to nursing unit with ongoing care.  Structural heart consultation for aortic valve replacement.  Would need to consider the possibility of native circumflex PCI, otherwise no notable PCI target. Defer further care to primary team.   Diagnostic Diagram        _____________   History of Present Illness     Matthew May is a 72 y.o. male w/ carotid artery stenosis, DM2, HLD, HTN, subclavian stenosis, severe AS, and CAD s/p CABG (2011) who presented on 12/30/2017 with presyncope.  In brief, the patient has a known history of severe aortic stenosis document of by echocardiogram in November 2018.  At that time he was asymptomatic and the decision was made to pursue watchful waiting.  The patient presented on 7/28 with 1 to 2 weeks of worsening dizziness with presyncopal episodes.  He denied having had any falls or frank syncope, however did note that his worsening symptoms have been concerning to him.  He denied any symptoms of palpitations, chest pain, chest pressure.  He did endorse symptoms of shortness of breath with exertion.  This is been stable for several weeks.  He had been taking all of his medications as prescribed.  He had stable bilateral lower externally edema with no recent changes.  His AS that was previously asymptomatic, now concerning for progressive disease so he was admitted for further evaluation.   Hospital Course  Consultants: None  His metformin was held and he was taken to the cath lab on 12/31/17, see above report. Per Dr. Percival Spanish, By cath the mean gradient was not particularly elevated, however, Dr. Ellyn Hack indicated some difficulty with measurements secondary to "BP variation"   Mean gradient on echo in Nov however, correlates with this this reading.  BNP was not elevated.  This would argue against the AS  as being the culprit for dizziness and SOB.  Medical management for his CAD.   He ambulated in the hall this morning with O2 sat remaining 96% and heart rate stable, SR in the low 100's and no dizziness. The patient will be discharged home today with plan for outpatient PFTs, echo and an event monitor.   He is to follow up with Evelina Dun, FNP for diabetes management.   Patient has been seen by Dr. Percival Spanish today and deemed ready for discharge home. All follow up appointments have been scheduled. Discharge medications are listed below.  _____________  Discharge Vitals Blood pressure (!) 89/60, pulse 70, temperature 97.6 F (36.4 C), temperature source Oral, resp. rate 19, height 5' 7.5" (1.715 m), weight 227 lb 8 oz (103.2 kg), SpO2 95 %.  Filed Weights   12/30/17 2013 12/31/17 0629 01/01/18 0510  Weight: 232 lb 14.4 oz (105.6 kg) 230 lb (104.3 kg) 227 lb 8 oz (103.2 kg)    Labs & Radiologic Studies    CBC Recent Labs    12/30/17 1524 12/30/17 2215 12/31/17 0428  WBC 9.0 8.9 8.1  NEUTROABS 6.3  --   --   HGB 13.5 12.5* 12.2*  HCT 40.4 37.9* 37.5*  MCV 90.2 90.2 90.8  PLT 186 172 546   Basic Metabolic Panel Recent Labs    12/30/17 1524 12/30/17 2215 12/31/17 0428  NA 138  --  139  K 3.6  --  4.0  CL 100  --  101  CO2 26  --  28  GLUCOSE 175*  --  182*  BUN 16  --  11  CREATININE 0.93 0.94 0.82  CALCIUM 9.2  --  8.9  MG  --   --  1.9   Liver Function Tests No results for input(s): AST, ALT, ALKPHOS, BILITOT, PROT, ALBUMIN in the last 72 hours. No results for input(s): LIPASE, AMYLASE in the last 72 hours. Cardiac Enzymes Recent Labs    12/30/17 1524  TROPONINI <0.03   BNP Invalid input(s): POCBNP D-Dimer No results for input(s): DDIMER in the last 72 hours. Hemoglobin A1C No results for input(s): HGBA1C in the last 72 hours. Fasting Lipid Panel No results for input(s): CHOL, HDL, LDLCALC, TRIG, CHOLHDL, LDLDIRECT in the last 72 hours. Thyroid  Function Tests No results for input(s): TSH, T4TOTAL, T3FREE, THYROIDAB in the last 72 hours.  Invalid input(s): FREET3 _____________  Dg Chest Portable 1 View  Result Date: 12/30/2017 CLINICAL DATA:  Shortness of breath EXAM: PORTABLE CHEST 1 VIEW COMPARISON:  11/26/2017 FINDINGS: Cardiac shadow is stable. Postsurgical changes are again seen. Mild vascular congestion is noted without interstitial edema. No focal infiltrate or sizable effusion is noted. No bony abnormality is seen. IMPRESSION: Mild vascular congestion without interstitial edema. Electronically Signed   By: Inez Catalina M.D.   On: 12/30/2017 15:41   Disposition   Pt is being discharged home today in good condition.  Follow-up Plans & Appointments    Follow-up Information    Minus Breeding, MD Follow up.   Specialty:  Cardiology Why:  Cardiology follow  up on August 9th at 1:20. Please arrive 15 minutes early for check in.  Contact information: [place] [address] [phone]        Sharion Balloon, FNP Follow up.   Specialty:  Family Medicine Contact information: [place] [address] [phone]        [facility] Follow up.   Why:  You will be called to arrange for an echocardiogram and pulmonary function testing at [facility].  Contact information: [address] [place] [address] [phone]       [facility] Follow up.   Specialty:  Cardiology Why:  You will be called to schedule for a heart monitor to be applied.  Contact information: [address], [place] [phone]         Discharge Instructions    Diet - low sodium heart healthy   Complete by:  As directed    Increase activity slowly   Complete by:  As directed       Discharge Medications   Allergies as of 01/01/2018      Reactions   Prednisone    Lisinopril Cough      Medication List    STOP  taking these medications   azithromycin 250 MG tablet Commonly known as:  ZITHROMAX     TAKE these medications   albuterol 108 (90 Base) MCG/ACT inhaler Commonly known as:  PROVENTIL HFA;VENTOLIN HFA Inhale 2 puffs into the lungs every 6 (six) hours as needed for wheezing or shortness of breath.   aspirin EC 81 MG tablet Take 81 mg by mouth daily.   atorvastatin 40 MG tablet Commonly known as:  LIPITOR TAKE 1 TABLET BY MOUTH  DAILY   empagliflozin 10 MG Tabs tablet Commonly known as:  JARDIANCE Take 10 mg by mouth daily.   fluticasone furoate-vilanterol 100-25 MCG/INH Aepb Commonly known as:  BREO ELLIPTA Inhale 1 puff into the lungs daily.   levothyroxine 125 MCG tablet Commonly known as:  SYNTHROID, LEVOTHROID TAKE 1 TABLET BY MOUTH  DAILY   metFORMIN 1000 MG tablet Commonly known as:  GLUCOPHAGE TAKE 1 TABLET BY MOUTH TWO  TIMES DAILY   nitroGLYCERIN 0.4 MG SL tablet Commonly known as:  NITROSTAT DISSOLVE ONE TABLET UNDER THE TONGUE EVERY 5 MINUTES AS NEEDED FOR CHEST PAIN.  DO NOT EXCEED A TOTAL OF 3 DOSES IN 15 MINUTES   ONETOUCH DELICA LANCETS 69S Misc USE ONE DAILY   ONETOUCH VERIO test strip Generic drug:  glucose blood   ONETOUCH VERIO test strip Generic drug:  glucose blood TEST EVERY DAY   potassium chloride SA 20 MEQ tablet Commonly known as:  K-DUR,KLOR-CON TAKE 1 TABLET BY MOUTH  EVERY DAY   torsemide 20 MG tablet Commonly known as:  DEMADEX TAKE 2 TABLETS BY MOUTH  DAILY   valsartan 80 MG tablet Commonly known as:  DIOVAN TAKE 1 TABLET BY MOUTH  DAILY   Vitamin D (Ergocalciferol) 50000 units Caps capsule Commonly known as:  DRISDOL TAKE 1 CAPSULE BY MOUTH ONCE A WEEK        Acute coronary syndrome (MI, NSTEMI, STEMI, etc) this admission?: No.    Outstanding Labs/Studies   Echocardiogram, PFT's and event monitor.   Duration of Discharge Encounter   Greater than 30 minutes including physician time.  Signed, Daune Perch,  NP 01/01/2018, 11:57 AM

## 2018-01-02 ENCOUNTER — Ambulatory Visit: Payer: Medicare Other | Admitting: Family

## 2018-01-02 ENCOUNTER — Encounter: Payer: Self-pay | Admitting: Family

## 2018-01-02 VITALS — BP 122/71 | HR 90 | Temp 97.6°F | Ht 67.5 in | Wt 228.0 lb

## 2018-01-02 DIAGNOSIS — Z09 Encounter for follow-up examination after completed treatment for conditions other than malignant neoplasm: Secondary | ICD-10-CM

## 2018-01-02 DIAGNOSIS — R42 Dizziness and giddiness: Secondary | ICD-10-CM

## 2018-01-02 DIAGNOSIS — E1165 Type 2 diabetes mellitus with hyperglycemia: Secondary | ICD-10-CM | POA: Diagnosis not present

## 2018-01-02 MED ORDER — FLUTICASONE PROPIONATE 50 MCG/ACT NA SUSP: 2.0000 | Freq: Every day | NASAL | 6 refills | Status: DC

## 2018-01-02 MED ORDER — EMPAGLIFLOZIN 10 MG PO TABS: 10.0000 mg | ORAL_TABLET | Freq: Every day | ORAL | 2 refills | Status: DC

## 2018-01-02 NOTE — Patient Instructions (Signed)
Vertigo °Vertigo is the feeling that you or your surroundings are moving when they are not. Vertigo can be dangerous if it occurs while you are doing something that could endanger you or others, such as driving. °What are the causes? °This condition is caused by a disturbance in the signals that are sent by your body’s sensory systems to your brain. Different causes of a disturbance can lead to vertigo, including: °· Infections, especially in the inner ear. °· A bad reaction to a drug, or misuse of alcohol and medicines. °· Withdrawal from drugs or alcohol. °· Quickly changing positions, as when lying down or rolling over in bed. °· Migraine headaches. °· Decreased blood flow to the brain. °· Decreased blood pressure. °· Increased pressure in the brain from a head or neck injury, stroke, infection, tumor, or bleeding. °· Central nervous system disorders. ° °What are the signs or symptoms? °Symptoms of this condition usually occur when you move your head or your eyes in different directions. Symptoms may start suddenly, and they usually last for less than a minute. Symptoms may include: °· Loss of balance and falling. °· Feeling like you are spinning or moving. °· Feeling like your surroundings are spinning or moving. °· Nausea and vomiting. °· Blurred vision or double vision. °· Difficulty hearing. °· Slurred speech. °· Dizziness. °· Involuntary eye movement (nystagmus). ° °Symptoms can be mild and cause only slight annoyance, or they can be severe and interfere with daily life. Episodes of vertigo may return (recur) over time, and they are often triggered by certain movements. Symptoms may improve over time. °How is this diagnosed? °This condition may be diagnosed based on medical history and the quality of your nystagmus. Your health care provider may test your eye movements by asking you to quickly change positions to trigger the nystagmus. This may be called the Dix-Hallpike test, head thrust test, or roll test.  You may be referred to a health care provider who specializes in ear, nose, and throat (ENT) problems (otolaryngologist) or a provider who specializes in disorders of the central nervous system (neurologist). °You may have additional testing, including: °· A physical exam. °· Blood tests. °· MRI. °· A CT scan. °· An electrocardiogram (ECG). This records electrical activity in your heart. °· An electroencephalogram (EEG). This records electrical activity in your brain. °· Hearing tests. ° °How is this treated? °Treatment for this condition depends on the cause and the severity of the symptoms. Treatment options include: °· Medicines to treat nausea or vertigo. These are usually used for severe cases. Some medicines that are used to treat other conditions may also reduce or eliminate vertigo symptoms. These include: °? Medicines that control allergies (antihistamines). °? Medicines that control seizures (anticonvulsants). °? Medicines that relieve depression (antidepressants). °? Medicines that relieve anxiety (sedatives). °· Head movements to adjust your inner ear back to normal. If your vertigo is caused by an ear problem, your health care provider may recommend certain movements to correct the problem. °· Surgery. This is rare. ° °Follow these instructions at home: °Safety °· Move slowly.Avoid sudden body or head movements. °· Avoid driving. °· Avoid operating heavy machinery. °· Avoid doing any tasks that would cause danger to you or others if you would have a vertigo episode during the task. °· If you have trouble walking or keeping your balance, try using a cane for stability. If you feel dizzy or unstable, sit down right away. °· Return to your normal activities as told by your   health care provider. Ask your health care provider what activities are safe for you. °General instructions °· Take over-the-counter and prescription medicines only as told by your health care provider. °· Avoid certain positions or  movements as told by your health care provider. °· Drink enough fluid to keep your urine clear or pale yellow. °· Keep all follow-up visits as told by your health care provider. This is important. °Contact a health care provider if: °· Your medicines do not relieve your vertigo or they make it worse. °· You have a fever. °· Your condition gets worse or you develop new symptoms. °· Your family or friends notice any behavioral changes. °· Your nausea or vomiting gets worse. °· You have numbness or a “pins and needles” sensation in part of your body. °Get help right away if: °· You have difficulty moving or speaking. °· You are always dizzy. °· You faint. °· You develop severe headaches. °· You have weakness in your hands, arms, or legs. °· You have changes in your hearing or vision. °· You develop a stiff neck. °· You develop sensitivity to light. °This information is not intended to replace advice given to you by your health care provider. Make sure you discuss any questions you have with your health care provider. °Document Released: 03/01/2005 Document Revised: 11/03/2015 Document Reviewed: 09/14/2014 °Elsevier Interactive Patient Education © 2018 [employer] ° °

## 2018-01-02 NOTE — Progress Notes (Signed)
   Subjective:    Patient ID: Matthew May, male    DOB: Aug 29, 1945, 72 y.o.   MRN: [id number]  Chief Complaint  Patient presents with  . Hospitalization Follow-up    HPI PT presents to the office today for hospital follow up. PT went to the ED on 12/30/17 for SOB and dizziness. Pt was discharge 01/01/18. He has Cardiologists follow up 01/11/18.   PT had cath on 12/31/17, "cath the mean gradient was not particularly elevated, however, Dr. Ellyn Hack indicated some difficulty with measurements secondary to "BP variation" Mean gradient on echo in Nov however, correlates with this this reading. BNP was not elevated."   He states his blood glucose has been "up and down".      Review of Systems  Neurological: Positive for dizziness.  All other systems reviewed and are negative.      Objective:   Physical Exam  Constitutional: He is oriented to person, place, and time. He appears well-developed and well-nourished. No distress.  HENT:  Head: Normocephalic.  Right Ear: External ear normal.  Left Ear: External ear normal.  Nose: Mucosal edema present.  Mouth/Throat: Oropharynx is clear and moist.  Eyes: Pupils are equal, round, and reactive to light. Right eye exhibits no discharge. Left eye exhibits no discharge.  Neck: Normal range of motion. Neck supple. No thyromegaly present.  Cardiovascular: Normal rate, regular rhythm and intact distal pulses.  Murmur heard. Pulmonary/Chest: Effort normal and breath sounds normal. No respiratory distress. He has no wheezes.  Abdominal: Soft. Bowel sounds are normal. He exhibits no distension. There is no tenderness.  Musculoskeletal: Normal range of motion. He exhibits edema (2+ BLe). He exhibits no tenderness.  Neurological: He is alert and oriented to person, place, and time. He has normal reflexes. No cranial nerve deficit.  Skin: Skin is warm and dry. No rash noted. No erythema.  Psychiatric: He has a normal mood and affect. His  behavior is normal. Judgment and thought content normal.  Vitals reviewed.     BP 122/71   Pulse 90   Temp 97.6 F (36.4 C) (Oral)   Ht 5' 7.5" (1.715 m)   Wt 228 lb (103.4 kg)   BMI 35.18 kg/m      Assessment & Plan:  [name] comes in today with chief complaint of Hospitalization Follow-up   Diagnosis and orders addressed:  1. Hospital discharge follow-up - CBC with Differential/Platelet - BMP8+EGFR  2. Dizziness - CBC with Differential/Platelet - BMP8+EGFR  3. Type 2 diabetes mellitus with hyperglycemia, without long-term current use of insulin (HCC) Will add Jardiance  Low carb diet  Stop prednisone  - CBC with Differential/Platelet - BMP8+EGFR - empagliflozin (JARDIANCE) 10 MG TABS tablet; Take 10 mg by mouth daily.  Dispense: 30 tablet; Refill: 2  4. Vertigo Fall prevents discussed Epley exercises discussed, handout given  - CBC with Differential/Platelet - BMP8+EGFR - fluticasone (FLONASE) 50 MCG/ACT nasal spray; Place 2 sprays into both nostrils daily.  Dispense: 16 g; Refill: 6   Labs pending Health Maintenance reviewed Diet and exercise encouraged  Follow up plan: Keep follow up with Cardiologists and PCP   Evelina Dun, FNP

## 2018-01-03 ENCOUNTER — Ambulatory Visit (INDEPENDENT_AMBULATORY_CARE_PROVIDER_SITE_OTHER): Payer: Medicare Other

## 2018-01-03 DIAGNOSIS — R55 Syncope and collapse: Secondary | ICD-10-CM

## 2018-01-03 DIAGNOSIS — R42 Dizziness and giddiness: Secondary | ICD-10-CM

## 2018-01-03 LAB — BMP8+EGFR
BUN/Creatinine Ratio: 11 (ref 10–24)
BUN: 14 mg/dL (ref 8–27)
CO2: 23 mmol/L (ref 20–29)
Calcium: 9.7 mg/dL (ref 8.6–10.2)
Chloride: 97 mmol/L (ref 96–106)
Creatinine, Ser: 1.32 mg/dL — ABNORMAL HIGH (ref 0.76–1.27)
GFR calc Af Amer: 63 mL/min/{1.73_m2} (ref >59)
GFR calc non Af Amer: 54 mL/min/{1.73_m2} — ABNORMAL LOW (ref >59)
Glucose: 213 mg/dL — ABNORMAL HIGH (ref 65–99)
Potassium: 4.3 mmol/L (ref 3.5–5.2)
Sodium: 141 mmol/L (ref 134–144)

## 2018-01-03 LAB — CBC WITH DIFFERENTIAL/PLATELET
Basophils Absolute: 0 x10E3/uL (ref 0.0–0.2)
Basos: 0 %
EOS (ABSOLUTE): 0.1 x10E3/uL (ref 0.0–0.4)
Eos: 1 %
Hematocrit: 41.7 % (ref 37.5–51.0)
Hemoglobin: 13.9 g/dL (ref 13.0–17.7)
Immature Grans (Abs): 0.1 x10E3/uL (ref 0.0–0.1)
Immature Granulocytes: 1 %
Lymphocytes Absolute: 1.9 x10E3/uL (ref 0.7–3.1)
Lymphs: 20 %
MCH: 30.1 pg (ref 26.6–33.0)
MCHC: 33.3 g/dL (ref 31.5–35.7)
MCV: 90 fL (ref 79–97)
Monocytes Absolute: 0.5 x10E3/uL (ref 0.1–0.9)
Monocytes: 6 %
Neutrophils Absolute: 6.6 x10E3/uL (ref 1.4–7.0)
Neutrophils: 72 %
Platelets: 216 x10E3/uL (ref 150–450)
RBC: 4.62 x10E6/uL (ref 4.14–5.80)
RDW: 13.8 % (ref 12.3–15.4)
WBC: 9.2 x10E3/uL (ref 3.4–10.8)

## 2018-01-08 ENCOUNTER — Ambulatory Visit (HOSPITAL_COMMUNITY)
Admission: RE | Admit: 2018-01-08 | Discharge: 2018-01-08 | Disposition: A | Discharge status: Home / Self Care | Payer: Medicare Other | Source: Ambulatory Visit | Attending: Cardiology | Admitting: Cardiology

## 2018-01-08 DIAGNOSIS — I10 Essential (primary) hypertension: Secondary | ICD-10-CM | POA: Insufficient documentation

## 2018-01-08 DIAGNOSIS — E785 Hyperlipidemia, unspecified: Secondary | ICD-10-CM | POA: Diagnosis not present

## 2018-01-08 DIAGNOSIS — R42 Dizziness and giddiness: Secondary | ICD-10-CM | POA: Insufficient documentation

## 2018-01-08 DIAGNOSIS — I771 Stricture of artery: Secondary | ICD-10-CM | POA: Insufficient documentation

## 2018-01-08 DIAGNOSIS — R0602 Shortness of breath: Secondary | ICD-10-CM | POA: Insufficient documentation

## 2018-01-08 DIAGNOSIS — I6523 Occlusion and stenosis of bilateral carotid arteries: Secondary | ICD-10-CM | POA: Insufficient documentation

## 2018-01-08 DIAGNOSIS — R55 Syncope and collapse: Secondary | ICD-10-CM | POA: Insufficient documentation

## 2018-01-08 DIAGNOSIS — I350 Nonrheumatic aortic (valve) stenosis: Secondary | ICD-10-CM | POA: Diagnosis not present

## 2018-01-08 DIAGNOSIS — E119 Type 2 diabetes mellitus without complications: Secondary | ICD-10-CM | POA: Insufficient documentation

## 2018-01-08 LAB — PULMONARY FUNCTION TEST
DL/VA % pred: 119 %
DL/VA: 5.3 ml/min/mmHg/L
DLCO cor % pred: 95 %
DLCO cor: 27.79 ml/min/mmHg
DLCO unc % pred: 93 %
DLCO unc: 27.23 ml/min/mmHg
FEF 25-75 Post: 2.86 L/sec
FEF 25-75 Pre: 2.12 L/sec
FEF2575-%Change-Post: 35 %
FEF2575-%Pred-Post: 128 %
FEF2575-%Pred-Pre: 95 %
FEV1-%Change-Post: 13 %
FEV1-%Pred-Post: 99 %
FEV1-%Pred-Pre: 87 %
FEV1-Post: 2.91 L
FEV1-Pre: 2.57 L
FEV1FVC-%Change-Post: 0 %
FEV1FVC-%Pred-Pre: 101 %
FEV6-%Change-Post: 12 %
FEV6-%Pred-Post: 100 %
FEV6-%Pred-Pre: 89 %
FEV6-Post: 3.77 L
FEV6-Pre: 3.37 L
FEV6FVC-%Change-Post: -1 %
FEV6FVC-%Pred-Post: 103 %
FEV6FVC-%Pred-Pre: 105 %
FVC-%Change-Post: 13 %
FVC-%Pred-Post: 96 %
FVC-%Pred-Pre: 85 %
FVC-Post: 3.87 L
FVC-Pre: 3.41 L
Post FEV1/FVC ratio: 75 %
Post FEV6/FVC ratio: 97 %
Pre FEV1/FVC ratio: 75 %
Pre FEV6/FVC Ratio: 99 %
RV % pred: 175 %
RV: 4.07 L
TLC % pred: 122 %
TLC: 8.01 L

## 2018-01-08 MED ORDER — ALBUTEROL SULFATE (2.5 MG/3ML) 0.083% IN NEBU
2.5000 mg | INHALATION_SOLUTION | Freq: Once | RESPIRATORY_TRACT | Status: AC
  Administered 2018-01-08: 2.5 mg via RESPIRATORY_TRACT

## 2018-01-08 NOTE — Progress Notes (Signed)
*  PRELIMINARY RESULTS* Echocardiogram 2D Echocardiogram has been performed.  Matthew May 01/08/2018, 1:42 PM

## 2018-01-09 ENCOUNTER — Ambulatory Visit: Payer: Medicare Other | Admitting: Cardiology

## 2018-01-10 NOTE — Progress Notes (Signed)
HPI The patient returns for followup of his known coronary disease.   Since I last saw him he was in the hospital in July with presyncope.  He had a cath with results as below.  Cath demonstrated moderate AS.   He was sent home and PFTs were obtained.  He did not have any significant findings consistent with lung disease.  He continues to having dizzy spells since his discharge in late July. He has 1-2 episodes per week, last being this morning. No LOC. Patient and wife endorse worsening SOB while walking to the mailbox. States this is progressively getting worse since his discharge.  Also reports heart palpitations that are not related to his dizzy spells. States overall he feels very fatigued, mostly in the morning. Denies chest pressure, pain, or tightness. Denies new leg swelling, PND, or orthopnea.    Allergies  Allergen Reactions  . Prednisone Other (See Comments)    Pt states "sugar went to 580"  . Lisinopril Cough    Current Outpatient Medications  Medication Sig Dispense Refill  . albuterol (PROVENTIL HFA;VENTOLIN HFA) 108 (90 Base) MCG/ACT inhaler Inhale 2 puffs into the lungs every 6 (six) hours as needed for wheezing or shortness of breath. 1 Inhaler 0  . aspirin EC 81 MG tablet Take 81 mg by mouth daily.    Marland Kitchen atorvastatin (LIPITOR) 40 MG tablet TAKE 1 TABLET BY MOUTH  DAILY 90 tablet 0  . empagliflozin (JARDIANCE) 10 MG TABS tablet Take 10 mg by mouth daily. 30 tablet 2  . fluticasone furoate-vilanterol (BREO ELLIPTA) 100-25 MCG/INH AEPB Inhale 1 puff into the lungs daily. 1 each 2  . levothyroxine (SYNTHROID, LEVOTHROID) 125 MCG tablet TAKE 1 TABLET BY MOUTH  DAILY 90 tablet 3  . metFORMIN (GLUCOPHAGE) 1000 MG tablet TAKE 1 TABLET BY MOUTH TWO  TIMES DAILY 180 tablet 0  . nitroGLYCERIN (NITROSTAT) 0.4 MG SL tablet DISSOLVE ONE TABLET UNDER THE TONGUE EVERY 5 MINUTES AS NEEDED FOR CHEST PAIN.  DO NOT EXCEED A TOTAL OF 3 DOSES IN 15 MINUTES 25 tablet 3  . potassium chloride SA  (K-DUR,KLOR-CON) 20 MEQ tablet TAKE 1 TABLET BY MOUTH  EVERY DAY 90 tablet 2  . torsemide (DEMADEX) 20 MG tablet TAKE 2 TABLETS BY MOUTH  DAILY 180 tablet 0  . valsartan (DIOVAN) 80 MG tablet TAKE 1 TABLET BY MOUTH  DAILY 90 tablet 0  . Vitamin D, Ergocalciferol, (DRISDOL) 50000 units CAPS capsule TAKE 1 CAPSULE BY MOUTH ONCE A WEEK 12 capsule 0  . ONETOUCH DELICA LANCETS 16X MISC USE ONE DAILY 100 each 1  . ONETOUCH VERIO test strip     . ONETOUCH VERIO test strip TEST EVERY DAY 100 each 1   No current facility-administered medications for this visit.     Past Medical History:  Diagnosis Date  . Aortic stenosis   . CAD (coronary artery disease)    a. s/p NSTEMI 4/11 => s/p CABG (L-LAD, S-OM2, S-PDA/PL);  b.  ETT-Myoview 6/14:  normal study, no ischemia, EF 67%  . Carotid stenosis    Carotid U/S 0/96:  RICA 0-45%, LICA 40-98%; right vertebral flow retrograde suggestive of steel-consider PV consult  . COPD (chronic obstructive pulmonary disease) ([place])   . HLD (hyperlipidemia)   . HTN (hypertension)   . Hx of echocardiogram    Echo 6/14: Mod LVH, focal basal hypertrophy, EF 50-55%, mild AS (mean 17 mmHg) and mild AI, mild to mod LAE, mild RAE  . Obesity   .  Renal insufficiency   . Subclavian artery stenosis, right ([place])    based upon carotid U/S done 11/2012    Past Surgical History:  Procedure Laterality Date  . CORONARY ARTERY BYPASS GRAFT     2011, LIMA to LAD coronary artery, SVG to OM2 branch of lect circumflex coronary artery, and a sequential SVG to psot descening to posterolateral branches to RCA  . endoscopic vein harvesting     right leg   . HERNIA REPAIR    . RIGHT/LEFT HEART CATH AND CORONARY/GRAFT ANGIOGRAPHY N/A 12/31/2017   Procedure: RIGHT/LEFT HEART CATH AND CORONARY/GRAFT ANGIOGRAPHY;  Surgeon: Leonie Man, MD;  Location: [facility];  Service: Cardiovascular;  Laterality: N/A;  . TONSILLECTOMY      ROS:  As stated in the HPI and negative for all  other systems.   PHYSICAL EXAM BP 124/80   Pulse 76   Ht 5' 7.5" (1.715 m)   Wt 230 lb (104.3 kg)   BMI 35.49 kg/m   GENERAL:  Well appearing NECK:  No jugular venous distention, waveform within normal limits, carotid upstroke brisk and symmetric, no bruits, no thyromegaly LUNGS:  Clear to auscultation bilaterally CHEST:  Well healed CABG scar. HEART:  PMI not displaced or sustained,S1 and S2 within normal limits, no S3, no S4, no clicks, no rubs, 3 of 6 apical systolic murmur, no diastolic murmurs ABD:  Flat, positive bowel sounds normal in frequency in pitch, no bruits, no rebound, no guarding, no midline pulsatile mass, no hepatomegaly, no splenomegaly EXT:  2 plus pulses throughout, trace bilaterally lower leg edema, no cyanosis no clubbing SKIN: no rashes, no nodules, mild chronic left leg venous stasis changes.     EKG: No EKG ordered today.   Lab Results  Component Value Date   CHOL 143 11/26/2017   TRIG 103 11/26/2017   HDL 34 (L) 11/26/2017   LDLCALC 88 11/26/2017    Cath:  Conclusion    Prox RCA to Dist RCA lesion is 100% stenosed.  Seq SVG- RPDA-RPL graft was visualized by angiography and is large. The graft exhibits no disease.  Prox Cx lesion is 95% stenosed. Mid Cx lesion is 100% stenosed beyond OM2  SVG-Cx graft was visualized by angiography and is normal in caliber. Origin to Prox Graft lesion is 100% stenosed - flush  Mid LAD lesion is 100% stenosed.  LIMA-LAD graft was visualized by angiography and is normal in caliber. The graft exhibits no disease.  Lat 1st Diag lesion is 90% stenosed.  RHC NUMBERS reviewed: LV end diastolic pressure is normal.- 12 mmHg; LV end diastolic pressure is normal.  There is moderate aortic valve stenosis.   Moderate aortic stenosis by catheterization, however there is significant respiratory variation and beat-to-beat variation of blood pressures making it very difficult to Adequately Assess.  Severe native CAD  with occluded mid RCA, subtotal occluded proximal circumflex after small OM branch, totally occluded LAD after D2.   Widely patent LIMA-LAD -Just after D2  Widely patent sequential SVG-RPDA-RPL1  Flush occlusion of SVG-OM   Right Heart Pressures PA P: 25/11 mmHg-mean 17 mmHg. PCWP: 9 mmHg. LV EDP is normal. LVP -EDP: 195/3 mmHg, 14 mmHg; 173/0 mmHg-14 million mercury  Right Atrium The right atrium is moderately dilated. Right atrial pressure is normal. RAP 4 mmHg  Right Ventricle The right ventricle is mildly dilated. RVP-EDP: 34/0 mmHg - 4 mmHg      ASSESSMENT AND PLAN  DIZZINESS: I do not feel this is consistent  with his AS, however, carotid ultrasound in May 2017 noted with retrograde flow in the right vertebral artery, ?right sided subclavian steal syndrome.  Will refer vascular surgery for further evaluation.   CORONARY ATHEROSCLEROSIS NATIVE CORONARY ARTERY - He had an occluded vein graft as described in the anatomy above.  This was stable and no intervention was needed.  He needs continued risk reduction.  HYPERTENSION -  The blood pressure is 124/80  at target. No change in medications is indicated. We will continue with therapeutic lifestyle changes (TLC).  DYSLIPIDEMIA -   LDL was 88.  He will remain on the meds as listed.  He will continue on meds as listed.   AS - This was moderate on cath as above.  I had a very long discussion with the patient and his family.  At this point by all measures he appears to have more moderate stenosis.  His symptoms are very vague with dizziness that he could be related to an arrhythmia that is being screened for.  He also might have some vertebral insufficiency or steal syndrome and we are going to send him to vascular surgery as above.  At this point however there is no clear indication that aortic stenosis is causing his symptoms or that the need for surgery is eminent.  I explained to the family the risks and benefits of TAVR and that  without severe disease and clear symptoms I would rather follow this symptomatically for now.  They understand that explanation and feel more comfortable with this discussion than they did previously.  CAROTID STENOSIS - Refer to vascular surgery for possible right sided subclavian artery steal syndrome.   OBESITY - Discussed lifestyle and diet changes.

## 2018-01-11 ENCOUNTER — Ambulatory Visit (INDEPENDENT_AMBULATORY_CARE_PROVIDER_SITE_OTHER): Payer: Medicare Other | Admitting: Cardiology

## 2018-01-11 ENCOUNTER — Encounter: Payer: Self-pay | Admitting: Cardiology

## 2018-01-11 VITALS — BP 124/80 | HR 76 | Ht 67.5 in | Wt 230.0 lb

## 2018-01-11 DIAGNOSIS — G458 Other transient cerebral ischemic attacks and related syndromes: Secondary | ICD-10-CM

## 2018-01-11 DIAGNOSIS — I10 Essential (primary) hypertension: Secondary | ICD-10-CM | POA: Diagnosis not present

## 2018-01-11 DIAGNOSIS — I350 Nonrheumatic aortic (valve) stenosis: Secondary | ICD-10-CM | POA: Diagnosis not present

## 2018-01-11 DIAGNOSIS — R42 Dizziness and giddiness: Secondary | ICD-10-CM

## 2018-01-11 DIAGNOSIS — R55 Syncope and collapse: Secondary | ICD-10-CM

## 2018-01-11 DIAGNOSIS — E785 Hyperlipidemia, unspecified: Secondary | ICD-10-CM

## 2018-01-11 NOTE — Patient Instructions (Signed)
Medication Instructions:  The current medical regimen is effective;  continue present plan and medications.  You are being referred to [facility] and will be contacted to schedule an appointment.  Follow-Up: Follow up with Dr Percival Spanish after your event monitor.  Thank you for choosing [facility]!!

## 2018-01-17 ENCOUNTER — Other Ambulatory Visit: Payer: Self-pay

## 2018-01-22 ENCOUNTER — Encounter: Payer: Medicare Other | Admitting: Vascular Surgery

## 2018-01-24 ENCOUNTER — Telehealth: Payer: Self-pay

## 2018-01-24 NOTE — Telephone Encounter (Signed)
Received a call from Seis Lagos at [facility] calling to report new onset Afib rate 125 for about 10 sec.Monitor strip was shown to Dr.Hochrein.He stated he will take care of.

## 2018-01-25 ENCOUNTER — Telehealth: Payer: Self-pay

## 2018-01-25 ENCOUNTER — Telehealth: Payer: Self-pay | Admitting: *Deleted

## 2018-01-25 ENCOUNTER — Telehealth: Payer: Self-pay | Admitting: Cardiology

## 2018-01-25 ENCOUNTER — Encounter: Payer: Self-pay | Admitting: Family

## 2018-01-25 DIAGNOSIS — H25041 Posterior subcapsular polar age-related cataract, right eye: Secondary | ICD-10-CM | POA: Diagnosis not present

## 2018-01-25 DIAGNOSIS — H25811 Combined forms of age-related cataract, right eye: Secondary | ICD-10-CM | POA: Diagnosis not present

## 2018-01-25 DIAGNOSIS — E119 Type 2 diabetes mellitus without complications: Secondary | ICD-10-CM | POA: Diagnosis not present

## 2018-01-25 DIAGNOSIS — Z961 Presence of intraocular lens: Secondary | ICD-10-CM | POA: Diagnosis not present

## 2018-01-25 LAB — HM DIABETES EYE EXAM

## 2018-01-25 NOTE — Telephone Encounter (Signed)
Call and leave a message on Mr Burtt voicemail to give office a call back to schedule appt with Dr Percival Spanish.

## 2018-01-25 NOTE — Telephone Encounter (Signed)
Follow up  ° ° °Patient is returning your call. °

## 2018-01-25 NOTE — Telephone Encounter (Signed)
New Message:   Pt return a call for clearance

## 2018-01-25 NOTE — Telephone Encounter (Addendum)
Spoke to pt and advised that we received clearance form today. Pt also informed that Dr. Warren Lacy would like to see him in the office based off monitor report. Pt verbalized understanding. Appointment scheduled for this mon 8/26 at 920 am.

## 2018-01-25 NOTE — Telephone Encounter (Signed)
Appointment scheduled for 01/28/18 at 920.

## 2018-01-25 NOTE — Telephone Encounter (Signed)
   [facility] Pre-operative Risk Assessment    Request for surgical clearance:  1. What type of surgery is being performed? Cataract extraction  2. When is this surgery scheduled? TBD  3. What type of clearance is required (medical clearance vs. Pharmacy clearance to hold med vs. Both)? Medical   4. Are there any medications that need to be held prior to surgery and how long? Aspirin  5. Practice name and name of physician performing surgery? [facility]  6. What is your office phone number [phone] ext [phone]    7.   What is your office fax number [phone]  8.   Anesthesia type (None, local, MAC, general) ? IV sedation    Kathyrn Lass 01/25/2018, 1:55 PM  _________________________________________________________________   (provider comments below)

## 2018-01-25 NOTE — Telephone Encounter (Signed)
appt made for 08/26 @ 9:20 am

## 2018-01-27 NOTE — Progress Notes (Signed)
HPI The patient returns for followup of his known coronary disease.   He was in the hospital in July with presyncope.  He had a cath with results as below.  Cath demonstrated moderate AS.   He was sent home and PFTs were obtained.  He did not have any significant findings consistent with lung disease.  He continues to having dizzy spells since his discharge in late July.   I put him on a monitor and he has been found to have atrial fib.  He returns for follow up to discuss this.    Since I saw him he has had a couple of episodes of sharp chest pain.  He is had to take a couple of nitroglycerin.  He did this apparently on Thursday of last week and Saturday of last week.  However, these are the only nitroglycerin he is taken in a while and he has not otherwise been feeling poorly.  He still has some staggering dizziness particularly in the morning.  However, he is not feeling palpitations.  It is entirely unclear whether any of his discomfort is related to his arrhythmias or whether his dizziness is related to his arrhythmia.  However, the monitor did demonstrate very brief runs of fibrillation.  Allergies  Allergen Reactions  . Prednisone Other (See Comments)    Pt states "sugar went to 580"  . Lisinopril Cough    Current Outpatient Medications  Medication Sig Dispense Refill  . albuterol (PROVENTIL HFA;VENTOLIN HFA) 108 (90 Base) MCG/ACT inhaler Inhale 2 puffs into the lungs every 6 (six) hours as needed for wheezing or shortness of breath. 1 Inhaler 0  . atorvastatin (LIPITOR) 40 MG tablet TAKE 1 TABLET BY MOUTH  DAILY 90 tablet 0  . empagliflozin (JARDIANCE) 10 MG TABS tablet Take 10 mg by mouth daily. 30 tablet 2  . fluticasone furoate-vilanterol (BREO ELLIPTA) 100-25 MCG/INH AEPB Inhale 1 puff into the lungs daily. 1 each 2  . levothyroxine (SYNTHROID, LEVOTHROID) 125 MCG tablet TAKE 1 TABLET BY MOUTH  DAILY 90 tablet 3  . metFORMIN (GLUCOPHAGE) 1000 MG tablet TAKE 1 TABLET BY MOUTH  TWO  TIMES DAILY 180 tablet 0  . nitroGLYCERIN (NITROSTAT) 0.4 MG SL tablet DISSOLVE ONE TABLET UNDER THE TONGUE EVERY 5 MINUTES AS NEEDED FOR CHEST PAIN.  DO NOT EXCEED A TOTAL OF 3 DOSES IN 15 MINUTES 25 tablet 3  . ONETOUCH DELICA LANCETS 18E MISC USE ONE DAILY 100 each 1  . ONETOUCH VERIO test strip     . ONETOUCH VERIO test strip TEST EVERY DAY 100 each 1  . potassium chloride SA (K-DUR,KLOR-CON) 20 MEQ tablet TAKE 1 TABLET BY MOUTH  EVERY DAY 90 tablet 2  . torsemide (DEMADEX) 20 MG tablet TAKE 2 TABLETS BY MOUTH  DAILY 180 tablet 0  . valsartan (DIOVAN) 80 MG tablet TAKE 1 TABLET BY MOUTH  DAILY 90 tablet 0  . Vitamin D, Ergocalciferol, (DRISDOL) 50000 units CAPS capsule TAKE 1 CAPSULE BY MOUTH ONCE A WEEK 12 capsule 0  . apixaban (ELIQUIS) 5 MG TABS tablet Take 1 tablet (5 mg total) by mouth 2 (two) times daily. 60 tablet 11   No current facility-administered medications for this visit.     Past Medical History:  Diagnosis Date  . Aortic stenosis   . CAD (coronary artery disease)    a. s/p NSTEMI 4/11 => s/p CABG (L-LAD, S-OM2, S-PDA/PL);  b.  ETT-Myoview 6/14:  normal study, no ischemia, EF 67%  .  Carotid stenosis    Carotid U/S 2/11:  RICA 9-41%, LICA 74-08%; right vertebral flow retrograde suggestive of steel-consider PV consult  . COPD (chronic obstructive pulmonary disease) ([place])   . HLD (hyperlipidemia)   . HTN (hypertension)   . Hx of echocardiogram    Echo 6/14: Mod LVH, focal basal hypertrophy, EF 50-55%, mild AS (mean 17 mmHg) and mild AI, mild to mod LAE, mild RAE  . Obesity   . Renal insufficiency   . Subclavian artery stenosis, right ([place])    based upon carotid U/S done 11/2012    Past Surgical History:  Procedure Laterality Date  . CORONARY ARTERY BYPASS GRAFT     2011, LIMA to LAD coronary artery, SVG to OM2 branch of lect circumflex coronary artery, and a sequential SVG to psot descening to posterolateral branches to RCA  . endoscopic vein harvesting      right leg   . HERNIA REPAIR    . RIGHT/LEFT HEART CATH AND CORONARY/GRAFT ANGIOGRAPHY N/A 12/31/2017   Procedure: RIGHT/LEFT HEART CATH AND CORONARY/GRAFT ANGIOGRAPHY;  Surgeon: Leonie Man, MD;  Location: [facility];  Service: Cardiovascular;  Laterality: N/A;  . TONSILLECTOMY      ROS:  As stated in the HPI and negative for all other systems.    PHYSICAL EXAM BP 128/78   Pulse (!) 56   Ht 5' 7.5" (1.715 m)   Wt 230 lb (104.3 kg)   BMI 35.49 kg/m   GENERAL:  Well appearing NECK:  No jugular venous distention, waveform within normal limits, carotid upstroke brisk and symmetric, no bruits, no thyromegaly LUNGS:  Clear to auscultation bilaterally CHEST:  Unremarkable HEART:  PMI not displaced or sustained,S1 and S2 within normal limits, no S3, no S4, no clicks, no rubs, 3 out of 6 apical systolic murmur radiating slightly at the aortic outflow tract, mid peaking, no diastolic murmurs ABD:  Flat, positive bowel sounds normal in frequency in pitch, no bruits, no rebound, no guarding, no midline pulsatile mass, no hepatomegaly, no splenomegaly EXT:  2 plus pulses throughout,, trace bilateral lower extremity edema, no cyanosis no clubbing, chronic venous stasis changes    EKG: NA    Lab Results  Component Value Date   CHOL 143 11/26/2017   TRIG 103 11/26/2017   HDL 34 (L) 11/26/2017   LDLCALC 88 11/26/2017    Cath:  Conclusion    Prox RCA to Dist RCA lesion is 100% stenosed.  Seq SVG- RPDA-RPL graft was visualized by angiography and is large. The graft exhibits no disease.  Prox Cx lesion is 95% stenosed. Mid Cx lesion is 100% stenosed beyond OM2  SVG-Cx graft was visualized by angiography and is normal in caliber. Origin to Prox Graft lesion is 100% stenosed - flush  Mid LAD lesion is 100% stenosed.  LIMA-LAD graft was visualized by angiography and is normal in caliber. The graft exhibits no disease.  Lat 1st Diag lesion is 90% stenosed.  RHC NUMBERS  reviewed: LV end diastolic pressure is normal.- 12 mmHg; LV end diastolic pressure is normal.  There is moderate aortic valve stenosis.   Moderate aortic stenosis by catheterization, however there is significant respiratory variation and beat-to-beat variation of blood pressures making it very difficult to Adequately Assess.  Severe native CAD with occluded mid RCA, subtotal occluded proximal circumflex after small OM branch, totally occluded LAD after D2.   Widely patent LIMA-LAD -Just after D2  Widely patent sequential SVG-RPDA-RPL1  Flush occlusion of SVG-OM  Right Heart Pressures PA P: 25/11 mmHg-mean 17 mmHg. PCWP: 9 mmHg. LV EDP is normal. LVP -EDP: 195/3 mmHg, 14 mmHg; 173/0 mmHg-14 million mercury  Right Atrium The right atrium is moderately dilated. Right atrial pressure is normal. RAP 4 mmHg  Right Ventricle The right ventricle is mildly dilated. RVP-EDP: 34/0 mmHg - 4 mmHg      ASSESSMENT AND PLAN  ATRIAL FIB:   Matthew May has a CHA2DS2 - VASc score of 3.  I had a discussion with the patient and his wife about atrial fibrillation.  He needs Eliquis 5 mg twice daily and will stop his aspirin.  At this point I had a discussion as to whether he would want to consider rhythm management which likely would need to be Tikosyn in the hospital.  It is not entirely clear at all that his symptoms of dizziness are related to the fibrillation.  He and his wife will talk about this and we will revisit this at the upcoming appointment next month.  Of note we had a discussion about the risk benefits of anticoagulation.  He understands the risk of stroke without blood thinners.  He has no bleeding contraindications that can be identified through his history or physical exam.  He would understand the risks of taking this medication.  DIZZINESS:   There is no way to know whether this is related to the atrial fib which is dressed as above.    CORONARY ATHEROSCLEROSIS NATIVE  CORONARY ARTERY - He had an occluded vein graft as described in the anatomy above.   I will stop the ASA as I add DOAC.   HYPERTENSION -  The blood pressure is  at target. No change in medications is indicated. We will continue with therapeutic lifestyle changes (TLC).  DYSLIPIDEMIA -   LDL was 88.  He will continue meds as listed.   AS - This was moderate on cath as above.   As in previous notes we had a long discussion about this at this point there is no clear indication that the symptoms he is describing is related to this or that he has an indication for valve replacement.   Please see the previous discussion  CAROTID STENOSIS - He has follow-up with [facility] tomorrow.    OBESITY - We have discussed the need to lose weight with diet and exercise.

## 2018-01-28 ENCOUNTER — Encounter: Payer: Self-pay | Admitting: Cardiology

## 2018-01-28 ENCOUNTER — Ambulatory Visit: Payer: Medicare Other | Admitting: Cardiology

## 2018-01-28 VITALS — BP 128/78 | HR 56 | Ht 67.5 in | Wt 230.0 lb

## 2018-01-28 DIAGNOSIS — I480 Paroxysmal atrial fibrillation: Secondary | ICD-10-CM

## 2018-01-28 DIAGNOSIS — I25118 Atherosclerotic heart disease of native coronary artery with other forms of angina pectoris: Secondary | ICD-10-CM

## 2018-01-28 MED ORDER — APIXABAN 5 MG PO TABS: 5.0000 mg | ORAL_TABLET | Freq: Two times a day (BID) | ORAL | 11 refills | Status: DC

## 2018-01-28 NOTE — Patient Instructions (Signed)
Medication Instructions:  STOP- Aspirin START- Eliquis 5 mg 1 tablet twice a day  If you need a refill on your cardiac medications before your next appointment, please call your pharmacy.  Labwork: None Ordered   Testing/Procedures: None Ordered   Follow-Up: Keep follow up appointment on 02/20/2018 @ 3:30 with Dr Percival Spanish in [place]     Thank you for choosing [facility] at [facility]!!

## 2018-01-29 ENCOUNTER — Ambulatory Visit: Payer: Medicare Other | Admitting: Vascular Surgery

## 2018-01-29 ENCOUNTER — Other Ambulatory Visit: Payer: Self-pay | Admitting: Vascular Surgery

## 2018-01-29 ENCOUNTER — Other Ambulatory Visit: Payer: Self-pay

## 2018-01-29 ENCOUNTER — Ambulatory Visit (HOSPITAL_COMMUNITY)
Admission: RE | Admit: 2018-01-29 | Discharge: 2018-01-29 | Disposition: A | Discharge status: Home / Self Care | Payer: Medicare Other | Source: Ambulatory Visit | Attending: Vascular Surgery | Admitting: Vascular Surgery

## 2018-01-29 ENCOUNTER — Encounter: Payer: Self-pay | Admitting: Vascular Surgery

## 2018-01-29 DIAGNOSIS — R42 Dizziness and giddiness: Secondary | ICD-10-CM

## 2018-01-29 DIAGNOSIS — G458 Other transient cerebral ischemic attacks and related syndromes: Secondary | ICD-10-CM

## 2018-01-29 DIAGNOSIS — I6523 Occlusion and stenosis of bilateral carotid arteries: Secondary | ICD-10-CM | POA: Insufficient documentation

## 2018-01-29 NOTE — Telephone Encounter (Signed)
Follow up  [facility] is calling to see if the pt's surgical clearance was acknowledge at the pt's appt yesterday. Please call

## 2018-01-29 NOTE — Telephone Encounter (Signed)
The patient is at acceptable risk for cataract surgery.  However, he should only hold ASA of necessary for cataract extraction.

## 2018-01-29 NOTE — Progress Notes (Signed)
Patient name: Matthew May MRN: [id number] DOB: 08-Mar-1946 Sex: male  REASON FOR CONSULT: Dizziness with right subclavian steal   HPI: Matthew May is a 72 y.o. male who was recently been diagnosed with atrial fibrillation and started on DOAC (Eliquis) as well as new findings of moderate aortic stenosis that was referred by cardiology for new dizziness and concern for subclavian steal.  Patient reports approximately one month of dizziness.  He states episodes can happen throughout the day - morning and evening.  Patient does not have to be doing anything when he develops an episode of dizziness including sitting still on the couch.  Does not actually pass out. There is no relieving factors.  He also thinks this all started when he started a new diabetes medication.  He denies any symptoms in his right arm like claudication or pain.  No right arm symptoms at all actually.  He says he can raise his flag on his flagpole and has no right arm claudication or dizziness episodes.  States he was told years ago he had a right subclavian stenosis.  Denies tobacco abuse.  Past Medical History:  Diagnosis Date  . Aortic stenosis   . CAD (coronary artery disease)    a. s/p NSTEMI 4/11 => s/p CABG (L-LAD, S-OM2, S-PDA/PL);  b.  ETT-Myoview 6/14:  normal study, no ischemia, EF 67%  . Carotid stenosis    Carotid U/S 5/02:  RICA 7-74%, LICA 12-87%; right vertebral flow retrograde suggestive of steel-consider PV consult  . COPD (chronic obstructive pulmonary disease) ([place])   . HLD (hyperlipidemia)   . HTN (hypertension)   . Hx of echocardiogram    Echo 6/14: Mod LVH, focal basal hypertrophy, EF 50-55%, mild AS (mean 17 mmHg) and mild AI, mild to mod LAE, mild RAE  . Obesity   . Renal insufficiency   . Subclavian artery stenosis, right ([place])    based upon carotid U/S done 11/2012    Past Surgical History:  Procedure Laterality Date  . CORONARY ARTERY BYPASS GRAFT     2011, LIMA to LAD coronary  artery, SVG to OM2 branch of lect circumflex coronary artery, and a sequential SVG to psot descening to posterolateral branches to RCA  . endoscopic vein harvesting     right leg   . HERNIA REPAIR    . RIGHT/LEFT HEART CATH AND CORONARY/GRAFT ANGIOGRAPHY N/A 12/31/2017   Procedure: RIGHT/LEFT HEART CATH AND CORONARY/GRAFT ANGIOGRAPHY;  Surgeon: Leonie Man, MD;  Location: [facility];  Service: Cardiovascular;  Laterality: N/A;  . TONSILLECTOMY      Family History  Problem Relation Age of Onset  . Heart disease Father   . Heart attack Father   . Mental illness Mother   . Mental illness Sister   . Diabetes Brother   . Depression Maternal Aunt     SOCIAL HISTORY: Social History   Socioeconomic History  . Marital status: Married    Spouse name: Not on file  . Number of children: 3  . Years of education: Not on file  . Highest education level: Not on file  Occupational History  . Occupation: Retired  Scientific laboratory technician  . Financial resource strain: Not very hard  . Food insecurity:    Worry: Never true    Inability: Never true  . Transportation needs:    Medical: No    Non-medical: No  Tobacco Use  . Smoking status: Former Smoker    Last attempt to quit:  07/11/2009    Years since quitting: 8.5  . Smokeless tobacco: Never Used  . Tobacco comment: smoked about 10 cig/day; used to smoke 2 ppd for many years   Substance and Sexual Activity  . Alcohol use: No    Alcohol/week: 0.0 standard drinks  . Drug use: No  . Sexual activity: Never  Lifestyle  . Physical activity:    Days per week: 0 days    Minutes per session: Not on file  . Stress: Only a little  Relationships  . Social connections:    Talks on phone: More than three times a week    Gets together: More than three times a week    Attends religious service: Never    Active member of club or organization: No    Attends meetings of clubs or organizations: Never    Relationship status: Married  . Intimate  partner violence:    Fear of current or ex partner: No    Emotionally abused: No    Physically abused: No    Forced sexual activity: No  Other Topics Concern  . Not on file  Social History Narrative   Lives with family    Allergies  Allergen Reactions  . Prednisone Other (See Comments)    Pt states "sugar went to 580"  . Lisinopril Cough    Current Outpatient Medications  Medication Sig Dispense Refill  . albuterol (PROVENTIL HFA;VENTOLIN HFA) 108 (90 Base) MCG/ACT inhaler Inhale 2 puffs into the lungs every 6 (six) hours as needed for wheezing or shortness of breath. 1 Inhaler 0  . apixaban (ELIQUIS) 5 MG TABS tablet Take 1 tablet (5 mg total) by mouth 2 (two) times daily. 60 tablet 11  . atorvastatin (LIPITOR) 40 MG tablet TAKE 1 TABLET BY MOUTH  DAILY 90 tablet 0  . empagliflozin (JARDIANCE) 10 MG TABS tablet Take 10 mg by mouth daily. 30 tablet 2  . fluticasone furoate-vilanterol (BREO ELLIPTA) 100-25 MCG/INH AEPB Inhale 1 puff into the lungs daily. 1 each 2  . levothyroxine (SYNTHROID, LEVOTHROID) 125 MCG tablet TAKE 1 TABLET BY MOUTH  DAILY 90 tablet 3  . metFORMIN (GLUCOPHAGE) 1000 MG tablet TAKE 1 TABLET BY MOUTH TWO  TIMES DAILY 180 tablet 0  . ONETOUCH DELICA LANCETS 10U MISC USE ONE DAILY 100 each 1  . ONETOUCH VERIO test strip     . ONETOUCH VERIO test strip TEST EVERY DAY 100 each 1  . potassium chloride SA (K-DUR,KLOR-CON) 20 MEQ tablet TAKE 1 TABLET BY MOUTH  EVERY DAY 90 tablet 2  . torsemide (DEMADEX) 20 MG tablet TAKE 2 TABLETS BY MOUTH  DAILY 180 tablet 0  . valsartan (DIOVAN) 80 MG tablet TAKE 1 TABLET BY MOUTH  DAILY 90 tablet 0  . Vitamin D, Ergocalciferol, (DRISDOL) 50000 units CAPS capsule TAKE 1 CAPSULE BY MOUTH ONCE A WEEK 12 capsule 0  . nitroGLYCERIN (NITROSTAT) 0.4 MG SL tablet DISSOLVE ONE TABLET UNDER THE TONGUE EVERY 5 MINUTES AS NEEDED FOR CHEST PAIN.  DO NOT EXCEED A TOTAL OF 3 DOSES IN 15 MINUTES (Patient not taking: Reported on 01/29/2018) 25  tablet 3   No current facility-administered medications for this visit.     REVIEW OF SYSTEMS:  [X]  denotes positive finding, [ ]  denotes negative finding Cardiac  Comments:  Chest pain or chest pressure: x   Shortness of breath upon exertion: x   Short of breath when lying flat:    Irregular heart rhythm: x   Lightheaded  Vascular x   Pain in calf, thigh, or hip brought on by ambulation:    Pain in feet at night that wakes you up from your sleep:     Blood clot in your veins:    Leg swelling:         Pulmonary    Oxygen at home:    Productive cough:     Wheezing:         Neurologic    Sudden weakness in arms or legs:     Sudden numbness in arms or legs:     Sudden onset of difficulty speaking or slurred speech:    Temporary loss of vision in one eye:     Problems with dizziness:         Gastrointestinal    Blood in stool:     Vomited blood:         Genitourinary    Burning when urinating:     Blood in urine:        Psychiatric    Major depression:         Hematologic    Bleeding problems:    Problems with blood clotting too easily:        Skin    Rashes or ulcers:        Constitutional    Fever or chills:      PHYSICAL EXAM: Vitals:   01/29/18 1413 01/29/18 1419  BP: 99/66 (!) 86/66  Pulse: (!) 106 (!) 106  Resp: 18   Temp: (!) 97.2 F (36.2 C)   TempSrc: Oral   SpO2: 100%   Weight: 102.5 kg   Height: 5' 7.5" (1.715 m)     GENERAL: The patient is a well-nourished male, in no acute distress. The vital signs are documented above. CARDIAC: Irregularly irregular rhythm. VASCULAR:  2+ palpable right radial pulse and 2+ palpable left radial pulse. No carotid bruits. 1+ palpable bilateral femoral artery pulses PULMONARY: No respiratory distress. ABDOMEN: Soft and non-tender with normal pitched bowel sounds.  MUSCULOSKELETAL: There are no major deformities or cyanosis. NEUROLOGIC: No focal weakness or paresthesias are detected. SKIN: There are  no ulcers or rashes noted. PSYCHIATRIC: The patient has a normal affect.  DATA:   I independently reviewed his carotid artery duplex and he has no significant ICA stenosis with velocities that are consistent with 1 to 39% stenosis bilateral.  He does have turbulent flow in the right subclavian artery with retrograde flow in the right vertebral artery.  His left vertebral artery is antegrade with multiphasic flow in the subclavian on the left.  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  Reviewed with the patient and his family this is a little bit difficult to decide how much he would benefit from an intervention.  His symptoms are unusual in that he gets dizziness when sitting still and denies any associated right arm symptoms.  He never actually passes out and has dizziness throughout the day in setting of new arrhythmia.  We will plan to refer him to neurology to rule out any other etiology for his dizziness.  I will plan to see him back after his neurology visit and if we have no other obvious etiology and he has made no improvement, then we would probably plan for a CTA of his chest/neck to see if there is a straightforward endovascular option.  It would also be reasonable to discuss with his PCP options for changing his Jardiance since he feels the dizziness started after taking this medication (I told him I have no expertise with this medication).     Marty Heck, MD [facility] Office: [phone] Pager: [place]

## 2018-01-29 NOTE — Telephone Encounter (Signed)
Dr. Percival Spanish, you saw this patient yesterday. Can you please address preop clearance request and fax to the requesting provider?

## 2018-01-30 ENCOUNTER — Ambulatory Visit: Payer: Medicare Other | Admitting: Family

## 2018-01-30 ENCOUNTER — Encounter: Payer: Self-pay | Admitting: Family

## 2018-01-30 VITALS — BP 118/59 | HR 78 | Temp 97.2°F | Ht 67.5 in | Wt 229.8 lb

## 2018-01-30 DIAGNOSIS — E1165 Type 2 diabetes mellitus with hyperglycemia: Secondary | ICD-10-CM

## 2018-01-30 DIAGNOSIS — B379 Candidiasis, unspecified: Secondary | ICD-10-CM | POA: Diagnosis not present

## 2018-01-30 DIAGNOSIS — I959 Hypotension, unspecified: Secondary | ICD-10-CM | POA: Diagnosis not present

## 2018-01-30 DIAGNOSIS — R42 Dizziness and giddiness: Secondary | ICD-10-CM | POA: Diagnosis not present

## 2018-01-30 LAB — CMP14+EGFR
ALT: 45 IU/L — ABNORMAL HIGH (ref 0–44)
AST: 38 IU/L (ref 0–40)
Albumin/Globulin Ratio: 1.7 (ref 1.2–2.2)
Albumin: 4.5 g/dL (ref 3.5–4.8)
Alkaline Phosphatase: 82 IU/L (ref 39–117)
BUN/Creatinine Ratio: 13 (ref 10–24)
BUN: 14 mg/dL (ref 8–27)
Bilirubin Total: 0.4 mg/dL (ref 0.0–1.2)
CO2: 23 mmol/L (ref 20–29)
Calcium: 9.4 mg/dL (ref 8.6–10.2)
Chloride: 98 mmol/L (ref 96–106)
Creatinine, Ser: 1.04 mg/dL (ref 0.76–1.27)
GFR calc Af Amer: 84 mL/min/{1.73_m2} (ref >59)
GFR calc non Af Amer: 72 mL/min/{1.73_m2} (ref >59)
Globulin, Total: 2.6 g/dL (ref 1.5–4.5)
Glucose: 158 mg/dL — ABNORMAL HIGH (ref 65–99)
Potassium: 4.1 mmol/L (ref 3.5–5.2)
Sodium: 139 mmol/L (ref 134–144)
Total Protein: 7.1 g/dL (ref 6.0–8.5)

## 2018-01-30 MED ORDER — VALSARTAN 40 MG PO TABS: 20.0000 mg | ORAL_TABLET | Freq: Every day | ORAL | 1 refills | Status: DC

## 2018-01-30 MED ORDER — FLUCONAZOLE 150 MG PO TABS: 150.0000 mg | ORAL_TABLET | ORAL | 0 refills | Status: DC | PRN

## 2018-01-30 NOTE — Patient Instructions (Signed)
Hypotension Hypotension, commonly called low blood pressure, is when the force of blood pumping through your arteries is too weak. Arteries are blood vessels that carry blood from the heart throughout the body. When blood pressure is too low, you may not get enough blood to your brain or to the rest of your organs. This can cause weakness, light-headedness, rapid heartbeat, and fainting. Depending on the cause and severity, hypotension may be harmless (benign) or cause serious problems (critical). What are the causes? Possible causes of hypotension include:  Blood loss.  Loss of body fluids (dehydration).  Heart problems.  Hormone (endocrine) problems.  Pregnancy.  Severe infection.  Lack of certain nutrients.  Severe allergic reactions (anaphylaxis).  Certain medicines, such as blood pressure medicine or medicines that make the body lose excess fluids (diuretics). Sometimes, hypotension can be caused by not taking medicine as directed, such as taking too much of a certain medicine.  What increases the risk? Certain factors can make you more likely to develop hypotension, including:  Age. Risk increases as you get older.  Conditions that affect the heart or the central nervous system.  Taking certain medicines, such as blood pressure medicine or diuretics.  Being pregnant.  What are the signs or symptoms? Symptoms of this condition may include:  Weakness.  Light-headedness.  Dizziness.  Blurred vision.  Fatigue.  Rapid heartbeat.  Fainting, in severe cases.  How is this diagnosed? This condition is diagnosed based on:  Your medical history.  Your symptoms.  Your blood pressure measurement. Your health care provider will check your blood pressure when you are: ? Lying down. ? Sitting. ? Standing.  A blood pressure reading is recorded as two numbers, such as "120 over 80" (or 120/80). The first ("top") number is called the systolic pressure. It is a  measure of the pressure in your arteries as your heart beats. The second ("bottom") number is called the diastolic pressure. It is a measure of the pressure in your arteries when your heart relaxes between beats. Blood pressure is measured in a unit called mm Hg. Healthy blood pressure for adults is 120/80. If your blood pressure is below 90/60, you may be diagnosed with hypotension. Other information or tests that may be used to diagnose hypotension include:  Your other vital signs, such as your heart rate and temperature.  Blood tests.  Tilt table test. For this test, you will be safely secured to a table that moves you from a lying position to an upright position. Your heart rhythm and blood pressure will be monitored during the test.  How is this treated? Treatment for this condition may include:  Changing your diet. This may involve eating more salt (sodium) or drinking more water.  Taking medicines to raise your blood pressure.  Changing the dosage of certain medicines you are taking that might be lowering your blood pressure.  Wearing compression stockings. These stockings help to prevent blood clots and reduce swelling in your legs.  In some cases, you may need to go to the hospital for:  Fluid replacement. This means you will receive fluids through an IV tube.  Blood replacement. This means you will receive donated blood through an IV tube (transfusion).  Treating an infection or heart problems, if this applies.  Monitoring. You may need to be monitored while medicines that you are taking wear off.  Follow these instructions at home: Eating and drinking   Drink enough fluid to keep your urine clear or pale yellow.    Eat a healthy diet and follow instructions from your health care provider about eating or drinking restrictions. A healthy diet includes: ? Fresh fruits and vegetables. ? Whole grains. ? Lean meats. ? Low-fat dairy products.  Eat extra salt only as  directed. Do not add extra salt to your diet unless your health care provider told you to do that.  Eat frequent, small meals.  Avoid standing up suddenly after eating. Medicines  Take over-the-counter and prescription medicines only as told by your health care provider. ? Follow instructions from your health care provider about changing the dosage of your current medicines, if this applies. ? Do not stop or adjust any of your medicines on your own. General instructions  Wear compression stockings as told by your health care provider.  Get up slowly from lying down or sitting positions. This gives your blood pressure a chance to adjust.  Avoid hot showers and excessive heat as directed by your health care provider.  Return to your normal activities as told by your health care provider. Ask your health care provider what activities are safe for you.  Do not use any products that contain nicotine or tobacco, such as cigarettes and e-cigarettes. If you need help quitting, ask your health care provider.  Keep all follow-up visits as told by your health care provider. This is important. Contact a health care provider if:  You vomit.  You have diarrhea.  You have a fever for more than 2-3 days.  You feel more thirsty than usual.  You feel weak and tired. Get help right away if:  You have chest pain.  You have a fast or irregular heartbeat.  You develop numbness in any part of your body.  You cannot move your arms or your legs.  You have trouble speaking.  You become sweaty or feel light-headed.  You faint.  You feel short of breath.  You have trouble staying awake.  You feel confused. This information is not intended to replace advice given to you by your health care provider. Make sure you discuss any questions you have with your health care provider. Document Released: 05/22/2005 Document Revised: 12/10/2015 Document Reviewed: 11/11/2015 Elsevier Interactive  Patient Education  2018 Elsevier Inc.  

## 2018-01-30 NOTE — Progress Notes (Signed)
   Subjective:    Patient ID: Matthew May, male    DOB: 1946/04/19, 72 y.o.   MRN: [id number]  No chief complaint on file.   HPI PT presents to the office today with dizziness and hypotension. Pt states he was at his Vascular and his BP was 86/66.   Pt reports he feels like his dizziness is worse since starting Jardiance. He reports over the last 30 days he has noticed an increase in dizziness. He also reports burning and itching at his penis and believes he had a "yeast infection".    Review of Systems  Constitutional: Positive for fatigue.  Neurological: Positive for dizziness and weakness.  All other systems reviewed and are negative.      Objective:   Physical Exam  Constitutional: He is oriented to person, place, and time. He appears well-developed and well-nourished. No distress.  HENT:  Head: Normocephalic.  Eyes: Pupils are equal, round, and reactive to light. Right eye exhibits no discharge. Left eye exhibits no discharge.  Neck: Normal range of motion. Neck supple. No thyromegaly present.  Cardiovascular: Normal rate, regular rhythm, normal heart sounds and intact distal pulses.  No murmur heard. Pulmonary/Chest: Effort normal and breath sounds normal. No respiratory distress. He has no wheezes.  Abdominal: Soft. Bowel sounds are normal. He exhibits no distension. There is no tenderness.  Musculoskeletal: He exhibits edema (trace BLE). He exhibits no tenderness.  weakness  Neurological: He is alert and oriented to person, place, and time. He has normal reflexes. No cranial nerve deficit.  Skin: Skin is warm and dry. No rash noted. No erythema.  Psychiatric: He has a normal mood and affect. His behavior is normal. Judgment and thought content normal.  Vitals reviewed.     BP (!) 118/59   Pulse 78   Temp (!) 97.2 F (36.2 C) (Oral)   Ht 5' 7.5" (1.715 m)   Wt 229 lb 12.8 oz (104.2 kg)   BMI 35.46 kg/m      Assessment & Plan:  Ilyas Lipsitz Meas comes in  today with chief complaint of Dizziness   Diagnosis and orders addressed:  1. Hypotension, unspecified hypotension type Will stop Diovan 80 mg and start Diovan 20 mg  Discussed decreasing Demadex to 1 tablet as needed for edema - CMP14+EGFR  2. Dizziness Fall preventions discussed - CMP14+EGFR  3. Yeast infection Will stop Jardiance today Diflucan today Let me know if symptoms do not improve - fluconazole (DIFLUCAN) 150 MG tablet; Take 1 tablet (150 mg total) by mouth every three (3) days as needed.  Dispense: 3 tablet; Refill: 0 - CMP14+EGFR  4. Type 2 diabetes mellitus with hyperglycemia, without long-term current use of insulin (HCC) Strict low carb diet Stopping Jardiance     Follow up plan: 1 week    Evelina Dun, FNP

## 2018-01-30 NOTE — Telephone Encounter (Signed)
Clearance faxed to [facility] and Dr Julian Reil

## 2018-01-31 ENCOUNTER — Other Ambulatory Visit: Payer: Self-pay | Admitting: Family

## 2018-02-01 ENCOUNTER — Encounter: Payer: Self-pay | Admitting: Neurology

## 2018-02-05 ENCOUNTER — Encounter: Payer: Self-pay | Admitting: Family

## 2018-02-05 ENCOUNTER — Ambulatory Visit: Payer: Medicare Other | Admitting: Family

## 2018-02-05 VITALS — BP 112/58 | HR 79 | Temp 97.3°F | Ht 67.5 in | Wt 230.4 lb

## 2018-02-05 DIAGNOSIS — Z1212 Encounter for screening for malignant neoplasm of rectum: Secondary | ICD-10-CM

## 2018-02-05 DIAGNOSIS — Z1211 Encounter for screening for malignant neoplasm of colon: Secondary | ICD-10-CM

## 2018-02-05 DIAGNOSIS — I10 Essential (primary) hypertension: Secondary | ICD-10-CM

## 2018-02-05 DIAGNOSIS — E1165 Type 2 diabetes mellitus with hyperglycemia: Secondary | ICD-10-CM | POA: Diagnosis not present

## 2018-02-05 DIAGNOSIS — R42 Dizziness and giddiness: Secondary | ICD-10-CM

## 2018-02-05 DIAGNOSIS — I152 Hypertension secondary to endocrine disorders: Secondary | ICD-10-CM

## 2018-02-05 DIAGNOSIS — E1159 Type 2 diabetes mellitus with other circulatory complications: Secondary | ICD-10-CM

## 2018-02-05 LAB — BAYER DCA HB A1C WAIVED: HB A1C (BAYER DCA - WAIVED): 7.7 % — ABNORMAL HIGH (ref <7.0)

## 2018-02-05 NOTE — Patient Instructions (Signed)
Dizziness °Dizziness is a common problem. It is a feeling of unsteadiness or light-headedness. You may feel like you are about to faint. Dizziness can lead to injury if you stumble or fall. Anyone can become dizzy, but dizziness is more common in older adults. This condition can be caused by a number of things, including medicines, dehydration, or illness. °Follow these instructions at home: °Eating and drinking °· Drink enough fluid to keep your urine clear or pale yellow. This helps to keep you from becoming dehydrated. Try to drink more clear fluids, such as water. °· Do not drink alcohol. °· Limit your caffeine intake if told to do so by your health care provider. Check ingredients and nutrition facts to see if a food or beverage contains caffeine. °· Limit your salt (sodium) intake if told to do so by your health care provider. Check ingredients and nutrition facts to see if a food or beverage contains sodium. °Activity °· Avoid making quick movements. °? Rise slowly from chairs and steady yourself until you feel okay. °? In the morning, first sit up on the side of the bed. When you feel okay, stand slowly while you hold onto something until you know that your balance is fine. °· If you need to stand in one place for a long time, move your legs often. Tighten and relax the muscles in your legs while you are standing. °· Do not drive or use heavy machinery if you feel dizzy. °· Avoid bending down if you feel dizzy. Place items in your home so that they are easy for you to reach without leaning over. °Lifestyle °· Do not use any products that contain nicotine or tobacco, such as cigarettes and e-cigarettes. If you need help quitting, ask your health care provider. °· Try to reduce your stress level by using methods such as yoga or meditation. Talk with your health care provider if you need help to manage your stress. °General instructions °· Watch your dizziness for any changes. °· Take over-the-counter and  prescription medicines only as told by your health care provider. Talk with your health care provider if you think that your dizziness is caused by a medicine that you are taking. °· Tell a friend or a family member that you are feeling dizzy. If he or she notices any changes in your behavior, have this person call your health care provider. °· Keep all follow-up visits as told by your health care provider. This is important. °Contact a health care provider if: °· Your dizziness does not go away. °· Your dizziness or light-headedness gets worse. °· You feel nauseous. °· You have reduced hearing. °· You have new symptoms. °· You are unsteady on your feet or you feel like the room is spinning. °Get help right away if: °· You vomit or have diarrhea and are unable to eat or drink anything. °· You have problems talking, walking, swallowing, or using your arms, hands, or legs. °· You feel generally weak. °· You are not thinking clearly or you have trouble forming sentences. It may take a friend or family member to notice this. °· You have chest pain, abdominal pain, shortness of breath, or sweating. °· Your vision changes. °· You have any bleeding. °· You have a severe headache. °· You have neck pain or a stiff neck. °· You have a fever. °These symptoms may represent a serious problem that is an emergency. Do not wait to see if the symptoms will go away. Get medical help   right away. Call your local emergency services (911 in the [place] Do not drive yourself to the hospital. °Summary °· Dizziness is a feeling of unsteadiness or light-headedness. This condition can be caused by a number of things, including medicines, dehydration, or illness. °· Anyone can become dizzy, but dizziness is more common in older adults. °· Drink enough fluid to keep your urine clear or pale yellow. Do not drink alcohol. °· Avoid making quick movements if you feel dizzy. Monitor your dizziness for any changes. °This information is not intended to  replace advice given to you by your health care provider. Make sure you discuss any questions you have with your health care provider. °Document Released: 11/15/2000 Document Revised: 06/24/2016 Document Reviewed: 06/24/2016 °Elsevier Interactive Patient Education © 2018 Elsevier Inc. ° °

## 2018-02-05 NOTE — Progress Notes (Signed)
Subjective:    Patient ID: Matthew May, male    DOB: 12/22/1945, 72 y.o.   MRN: [id number]  Chief Complaint  Patient presents with  . Hypotension   Pt presents to the office today to recheck hypotension and dizziness. Pt was seen on 01/30/18 and we stopped his Jardiance related to dehydration and yeast infection. He reports he is feeling much better, but still has some in the morning after taking his medications for a few hours.  Diabetes  He presents for his follow-up diabetic visit. He has type 2 diabetes mellitus. His disease course has been stable. Hypoglycemia symptoms include dizziness. Pertinent negatives for diabetes include no blurred vision and no visual change. Symptoms are stable. Diabetic complications include heart disease. Pertinent negatives for diabetic complications include no CVA. He is following a generally unhealthy diet. His overall blood glucose range is 140-180 mg/dl.  Hypertension  This is a chronic problem. The current episode started more than 1 year ago. The problem has been waxing and waning since onset. The problem is controlled. Associated symptoms include peripheral edema. Pertinent negatives include no blurred vision or malaise/fatigue. Risk factors for coronary artery disease include dyslipidemia, obesity and male gender. The current treatment provides moderate improvement. There is no history of kidney disease, CAD/MI, CVA or heart failure.      Review of Systems  Constitutional: Negative for malaise/fatigue.  Eyes: Negative for blurred vision.  Neurological: Positive for dizziness.  All other systems reviewed and are negative.      Objective:   Physical Exam  Constitutional: He is oriented to person, place, and time. He appears well-developed and well-nourished. No distress.  HENT:  Head: Normocephalic.  Eyes: Pupils are equal, round, and reactive to light. Right eye exhibits no discharge. Left eye exhibits no discharge.  Neck: Normal range of  motion. Neck supple. No thyromegaly present.  Cardiovascular: Normal rate, regular rhythm and intact distal pulses.  Murmur heard. Pulmonary/Chest: Effort normal and breath sounds normal. No respiratory distress. He has no wheezes.  Abdominal: Soft. Bowel sounds are normal. He exhibits no distension. There is no tenderness.  Musculoskeletal: Normal range of motion. He exhibits no edema or tenderness.  Neurological: He is alert and oriented to person, place, and time. He has normal reflexes. No cranial nerve deficit.  Skin: Skin is warm and dry. No rash noted. No erythema.  Psychiatric: He has a normal mood and affect. His behavior is normal. Judgment and thought content normal.  Vitals reviewed.   Diabetic Foot Exam - Simple   Simple Foot Form Diabetic Foot exam was performed with the following findings:  Yes 02/05/2018 12:01 PM  Visual Inspection No deformities, no ulcerations, no other skin breakdown bilaterally:  Yes Sensation Testing Intact to touch and monofilament testing bilaterally:  Yes Pulse Check Posterior Tibialis and Dorsalis pulse intact bilaterally:  Yes Comments      BP (!) 112/58   Pulse 79   Temp (!) 97.3 F (36.3 C) (Oral)   Ht 5' 7.5" (1.715 m)   Wt 230 lb 6.4 oz (104.5 kg)   BMI 35.55 kg/m      Assessment & Plan:  Matthew May comes in today with chief complaint of Hypotension   Diagnosis and orders addressed:  1. Type 2 diabetes mellitus with hyperglycemia, without long-term current use of insulin (HCC) - Bayer DCA Hb A1c Waived - CMP14+EGFR  2. Hypertension associated with diabetes ([place]) - CMP14+EGFR  3. Morbid obesity ([place]) - CMP14+EGFR  4.  Colon cancer screening - CMP14+EGFR - Cologuard  5. Encounter for screening for malignant neoplasm of rectum - CMP14+EGFR - Cologuard  6. Dizziness -Discussed he can decreased Demadex to 20 mg in AM from 40 mg.  Discussed weight himself daily and report any weight gain May need to increase if  he notices swelling    Labs pending Health Maintenance reviewed- Will get Diabetic eye exam and scan into chart Diet and exercise encouraged  Follow up plan: 1 month   Evelina Dun, FNP

## 2018-02-06 LAB — CMP14+EGFR
ALT: 37 IU/L (ref 0–44)
AST: 26 IU/L (ref 0–40)
Albumin/Globulin Ratio: 1.8 (ref 1.2–2.2)
Albumin: 4.7 g/dL (ref 3.5–4.8)
Alkaline Phosphatase: 84 IU/L (ref 39–117)
BUN/Creatinine Ratio: 18 (ref 10–24)
BUN: 17 mg/dL (ref 8–27)
Bilirubin Total: 0.5 mg/dL (ref 0.0–1.2)
CO2: 24 mmol/L (ref 20–29)
Calcium: 9.2 mg/dL (ref 8.6–10.2)
Chloride: 97 mmol/L (ref 96–106)
Creatinine, Ser: 0.97 mg/dL (ref 0.76–1.27)
GFR calc Af Amer: 91 mL/min/{1.73_m2} (ref >59)
GFR calc non Af Amer: 79 mL/min/{1.73_m2} (ref >59)
Globulin, Total: 2.6 g/dL (ref 1.5–4.5)
Glucose: 147 mg/dL — ABNORMAL HIGH (ref 65–99)
Potassium: 4.2 mmol/L (ref 3.5–5.2)
Sodium: 141 mmol/L (ref 134–144)
Total Protein: 7.3 g/dL (ref 6.0–8.5)

## 2018-02-19 NOTE — Progress Notes (Signed)
HPI The patient returns for followup of his known coronary disease.   He was in the hospital in July with presyncope.  He had a cath with results as below.  Cath demonstrated moderate AS.   He was sent home and PFTs were obtained.  He did not have any significant findings consistent with lung disease.  He continues to having dizzy spells since his discharge in late July.   I put him on a monitor and he has been found to have atrial fib.  He has since been strated on Eliquis.      Since I last saw him he has been taken off of Jardiance.  He also had his Torsemide reduced.  His dizziness is much improved.  The patient denies any new symptoms such as chest discomfort, neck or arm discomfort. There has been no new shortness of breath, PND or orthopnea. There have been no reported palpitations, presyncope or syncope.   Allergies  Allergen Reactions  . Prednisone Other (See Comments)    Pt states "sugar went to 580"  . Jardiance [Empagliflozin] Rash  . Lisinopril Cough    Current Outpatient Medications  Medication Sig Dispense Refill  . albuterol (PROVENTIL HFA;VENTOLIN HFA) 108 (90 Base) MCG/ACT inhaler Inhale 2 puffs into the lungs every 6 (six) hours as needed for wheezing or shortness of breath. 1 Inhaler 0  . apixaban (ELIQUIS) 5 MG TABS tablet Take 1 tablet (5 mg total) by mouth 2 (two) times daily. 60 tablet 11  . atorvastatin (LIPITOR) 40 MG tablet TAKE 1 TABLET BY MOUTH  DAILY 90 tablet 1  . fluticasone furoate-vilanterol (BREO ELLIPTA) 100-25 MCG/INH AEPB Inhale 1 puff into the lungs daily. 1 each 2  . levothyroxine (SYNTHROID, LEVOTHROID) 125 MCG tablet TAKE 1 TABLET BY MOUTH  DAILY 90 tablet 3  . metFORMIN (GLUCOPHAGE) 1000 MG tablet TAKE 1 TABLET BY MOUTH TWO  TIMES DAILY 180 tablet 0  . nitroGLYCERIN (NITROSTAT) 0.4 MG SL tablet DISSOLVE ONE TABLET UNDER THE TONGUE EVERY 5 MINUTES AS NEEDED FOR CHEST PAIN.  DO NOT EXCEED A TOTAL OF 3 DOSES IN 15 MINUTES 25 tablet 3  . potassium  chloride SA (K-DUR,KLOR-CON) 20 MEQ tablet TAKE 1 TABLET BY MOUTH  EVERY DAY 90 tablet 2  . torsemide (DEMADEX) 20 MG tablet Take 20 mg by mouth daily.    . valsartan (DIOVAN) 80 MG tablet TAKE 1 TABLET BY MOUTH  DAILY 90 tablet 1  . Vitamin D, Ergocalciferol, (DRISDOL) 50000 units CAPS capsule TAKE 1 CAPSULE BY MOUTH ONCE A WEEK 12 capsule 0  . ONETOUCH DELICA LANCETS 09B MISC USE ONE DAILY 100 each 1  . ONETOUCH VERIO test strip     . ONETOUCH VERIO test strip TEST EVERY DAY 100 each 1   No current facility-administered medications for this visit.     Past Medical History:  Diagnosis Date  . Aortic stenosis   . CAD (coronary artery disease)    a. s/p NSTEMI 4/11 => s/p CABG (L-LAD, S-OM2, S-PDA/PL);  b.  ETT-Myoview 6/14:  normal study, no ischemia, EF 67%  . Carotid stenosis    Carotid U/S 3/53:  RICA 2-99%, LICA 24-26%; right vertebral flow retrograde suggestive of steel-consider PV consult  . COPD (chronic obstructive pulmonary disease) ([place])   . HLD (hyperlipidemia)   . HTN (hypertension)   . Hx of echocardiogram    Echo 6/14: Mod LVH, focal basal hypertrophy, EF 50-55%, mild AS (mean 17 mmHg) and mild  AI, mild to mod LAE, mild RAE  . Obesity   . Renal insufficiency   . Subclavian artery stenosis, right ([place])    based upon carotid U/S done 11/2012    Past Surgical History:  Procedure Laterality Date  . CORONARY ARTERY BYPASS GRAFT     2011, LIMA to LAD coronary artery, SVG to OM2 branch of lect circumflex coronary artery, and a sequential SVG to psot descening to posterolateral branches to RCA  . endoscopic vein harvesting     right leg   . HERNIA REPAIR    . RIGHT/LEFT HEART CATH AND CORONARY/GRAFT ANGIOGRAPHY N/A 12/31/2017   Procedure: RIGHT/LEFT HEART CATH AND CORONARY/GRAFT ANGIOGRAPHY;  Surgeon: Leonie Man, MD;  Location: [facility];  Service: Cardiovascular;  Laterality: N/A;  . TONSILLECTOMY      ROS:  As stated in the HPI and negative for all other  systems.  PHYSICAL EXAM BP 122/66   Pulse 76   Ht 5' 7.6" (1.717 m)   Wt 234 lb (106.1 kg)   BMI 36.00 kg/m   GENERAL:  Well appearing NECK:  No jugular venous distention, waveform within normal limits, carotid upstroke brisk and symmetric, no bruits, no thyromegaly LUNGS:  Clear to auscultation bilaterally CHEST:  Well healed sternotomy scar. HEART:  PMI not displaced or sustained,S1 and S2 within normal limits, no S3, no S4, no clicks, no rubs, 3 out of 6 apical systolic murmur radiating out his aortic outflow tract, no diastolic murmurs ABD:  Flat, positive bowel sounds normal in frequency in pitch, no bruits, no rebound, no guarding, no midline pulsatile mass, no hepatomegaly, no splenomegaly, obese EXT:  2 plus pulses throughout, no edema, no cyanosis no clubbing, chronic venous stasis changes   EKG: NA   Lab Results  Component Value Date   CHOL 143 11/26/2017   TRIG 103 11/26/2017   HDL 34 (L) 11/26/2017   LDLCALC 88 11/26/2017    Cath:  Conclusion    Prox RCA to Dist RCA lesion is 100% stenosed.  Seq SVG- RPDA-RPL graft was visualized by angiography and is large. The graft exhibits no disease.  Prox Cx lesion is 95% stenosed. Mid Cx lesion is 100% stenosed beyond OM2  SVG-Cx graft was visualized by angiography and is normal in caliber. Origin to Prox Graft lesion is 100% stenosed - flush  Mid LAD lesion is 100% stenosed.  LIMA-LAD graft was visualized by angiography and is normal in caliber. The graft exhibits no disease.  Lat 1st Diag lesion is 90% stenosed.  RHC NUMBERS reviewed: LV end diastolic pressure is normal.- 12 mmHg; LV end diastolic pressure is normal.  There is moderate aortic valve stenosis.   Moderate aortic stenosis by catheterization, however there is significant respiratory variation and beat-to-beat variation of blood pressures making it very difficult to Adequately Assess.  Severe native CAD with occluded mid RCA, subtotal occluded  proximal circumflex after small OM branch, totally occluded LAD after D2.   Widely patent LIMA-LAD -Just after D2  Widely patent sequential SVG-RPDA-RPL1  Flush occlusion of SVG-OM   Right Heart Pressures PA P: 25/11 mmHg-mean 17 mmHg. PCWP: 9 mmHg. LV EDP is normal. LVP -EDP: 195/3 mmHg, 14 mmHg; 173/0 mmHg-14 million mercury  Right Atrium The right atrium is moderately dilated. Right atrial pressure is normal. RAP 4 mmHg  Right Ventricle The right ventricle is mildly dilated. RVP-EDP: 34/0 mmHg - 4 mmHg      ASSESSMENT AND PLAN  ATRIAL FIB:  Mr. GIOVANNIE SCERBO has a CHA2DS2 - VASc score of 3.  He is now on Eliquis.   He tolerates this.  No change in therapy.   DIZZINESS:   This is much improved with the removal of Jardiance and reduction in the Torsemide.  We discussed as needed dosing of his diuretic were he to get any shortness of breath.  We discussed salt restriction especially as [place].  CORONARY ATHEROSCLEROSIS NATIVE CORONARY ARTERY - He had an occluded vein graft as described in the anatomy above.     No change in therapy.   HYPERTENSION -  The blood pressure is at target.  No change in therapy.   DYSLIPIDEMIA -   LDL was 88.  He will continue meds as listed.   AS - This was moderate on cath as above.  I think this is moderately severe and there is no indication for valve replacement.  I will follow this with follow-up echoes in the future.  He and his family and I have had a long discussion about this on a couple of occasions.  CAROTID STENOSIS - This is followed by [facility].    OBESITY - We have discussed diet in the past.

## 2018-02-20 ENCOUNTER — Encounter: Payer: Self-pay | Admitting: Cardiology

## 2018-02-20 ENCOUNTER — Ambulatory Visit (INDEPENDENT_AMBULATORY_CARE_PROVIDER_SITE_OTHER): Payer: Medicare Other | Admitting: Cardiology

## 2018-02-20 VITALS — BP 122/66 | HR 76 | Ht 67.6 in | Wt 234.0 lb

## 2018-02-20 DIAGNOSIS — I480 Paroxysmal atrial fibrillation: Secondary | ICD-10-CM | POA: Diagnosis not present

## 2018-02-20 DIAGNOSIS — I2510 Atherosclerotic heart disease of native coronary artery without angina pectoris: Secondary | ICD-10-CM | POA: Diagnosis not present

## 2018-02-20 DIAGNOSIS — R55 Syncope and collapse: Secondary | ICD-10-CM

## 2018-02-20 DIAGNOSIS — I350 Nonrheumatic aortic (valve) stenosis: Secondary | ICD-10-CM | POA: Diagnosis not present

## 2018-02-20 DIAGNOSIS — R42 Dizziness and giddiness: Secondary | ICD-10-CM | POA: Diagnosis not present

## 2018-02-20 NOTE — Patient Instructions (Addendum)
Medication Instructions:  The current medical regimen is effective;  continue present plan and medications.  Follow-Up: Follow up in 4 months with Dr. Hochrein.  You will receive a letter in the mail 2 months before you are due.  Please call us when you receive this letter to schedule your follow up appointment.  If you need a refill on your cardiac medications before your next appointment, please call your pharmacy.  Thank you for choosing [facility]!!     

## 2018-02-28 ENCOUNTER — Ambulatory Visit: Payer: Medicare Other | Admitting: Family

## 2018-03-06 ENCOUNTER — Other Ambulatory Visit: Payer: Self-pay | Admitting: Family

## 2018-03-08 ENCOUNTER — Encounter: Payer: Self-pay | Admitting: Family

## 2018-03-08 ENCOUNTER — Ambulatory Visit: Payer: Medicare Other | Admitting: Family

## 2018-03-08 VITALS — BP 139/82 | HR 83 | Temp 97.5°F | Ht 67.5 in | Wt 232.6 lb

## 2018-03-08 DIAGNOSIS — I10 Essential (primary) hypertension: Secondary | ICD-10-CM

## 2018-03-08 DIAGNOSIS — R42 Dizziness and giddiness: Secondary | ICD-10-CM | POA: Diagnosis not present

## 2018-03-08 DIAGNOSIS — I152 Hypertension secondary to endocrine disorders: Secondary | ICD-10-CM

## 2018-03-08 DIAGNOSIS — E1159 Type 2 diabetes mellitus with other circulatory complications: Secondary | ICD-10-CM | POA: Diagnosis not present

## 2018-03-08 DIAGNOSIS — Z23 Encounter for immunization: Secondary | ICD-10-CM | POA: Diagnosis not present

## 2018-03-08 MED ORDER — VITAMIN D (ERGOCALCIFEROL) 1.25 MG (50000 UNIT) PO CAPS: 50000.0000 [IU] | ORAL_CAPSULE | ORAL | 1 refills | Status: DC

## 2018-03-08 NOTE — Patient Instructions (Signed)

## 2018-03-08 NOTE — Addendum Note (Signed)
Addended by: Evelina Dun A on: 03/08/2018 09:07 AM   Modules accepted: Orders

## 2018-03-08 NOTE — Progress Notes (Signed)
   Subjective:    Patient ID: Matthew May, male    DOB: 1945-12-15, 72 y.o.   MRN: [id number]  Chief Complaint  Patient presents with  . follow up from low BP and dizziness, swelling   PT presents to the office to recheck HTN and dizziness. We decreased his Demadex to 20 mg from 40 mg and Diovan to 20 mg from 40 mg. He reports his dizziness is much improved. Denies any swelling or SOB at this time. His BP is at goal today.  Hypertension  This is a chronic problem. The current episode started more than 1 year ago. The problem has been waxing and waning since onset. The problem is controlled. Associated symptoms include malaise/fatigue and peripheral edema. Pertinent negatives include no headaches or shortness of breath. Risk factors for coronary artery disease include dyslipidemia, diabetes mellitus, obesity and male gender. The current treatment provides moderate improvement. Hypertensive end-organ damage includes CAD/MI and heart failure.      Review of Systems  Constitutional: Positive for malaise/fatigue.  Respiratory: Negative for shortness of breath.   Neurological: Positive for dizziness. Negative for headaches.  All other systems reviewed and are negative.      Objective:   Physical Exam  Constitutional: He is oriented to person, place, and time. He appears well-developed and well-nourished. No distress.  HENT:  Head: Normocephalic.  Eyes: Pupils are equal, round, and reactive to light. Right eye exhibits no discharge. Left eye exhibits no discharge.  Neck: Normal range of motion. Neck supple. No thyromegaly present.  Cardiovascular: Normal rate, regular rhythm and intact distal pulses.  Murmur heard. Pulmonary/Chest: Effort normal and breath sounds normal. No respiratory distress. He has no wheezes.  Abdominal: Soft. Bowel sounds are normal. He exhibits no distension. There is no tenderness.  Musculoskeletal: Normal range of motion. He exhibits edema (trace BLE). He  exhibits no tenderness.  Neurological: He is oriented to person, place, and time. He has normal reflexes. No cranial nerve deficit.  Skin: Skin is warm and dry. No rash noted. No erythema.  Psychiatric: He has a normal mood and affect. His behavior is normal. Judgment and thought content normal.  Vitals reviewed.     BP 139/82   Pulse 83   Temp (!) 97.5 F (36.4 C) (Oral)   Ht 5' 7.5" (1.715 m)   Wt 232 lb 9.6 oz (105.5 kg)   BMI 35.89 kg/m      Assessment & Plan:  Matthew May comes in today with chief complaint of follow up from low BP and dizziness, swelling   Diagnosis and orders addressed:  1. Hypertension associated with diabetes ([place]) - BMP8+EGFR  2. Dizziness - BMP8+EGFR  3. Morbid obesity ([place]) - BMP8+EGFR   Labs pending Health Maintenance reviewed Diet and exercise encouraged  Follow up plan: 3 months    Evelina Dun, FNP

## 2018-03-09 LAB — BMP8+EGFR
BUN/Creatinine Ratio: 13 (ref 10–24)
BUN: 13 mg/dL (ref 8–27)
CO2: 23 mmol/L (ref 20–29)
Calcium: 9.8 mg/dL (ref 8.6–10.2)
Chloride: 99 mmol/L (ref 96–106)
Creatinine, Ser: 0.98 mg/dL (ref 0.76–1.27)
GFR calc Af Amer: 90 mL/min/{1.73_m2} (ref >59)
GFR calc non Af Amer: 78 mL/min/{1.73_m2} (ref >59)
Glucose: 159 mg/dL — ABNORMAL HIGH (ref 65–99)
Potassium: 4.1 mmol/L (ref 3.5–5.2)
Sodium: 142 mmol/L (ref 134–144)

## 2018-03-11 ENCOUNTER — Other Ambulatory Visit: Payer: Self-pay | Admitting: *Deleted

## 2018-03-11 ENCOUNTER — Other Ambulatory Visit: Payer: Self-pay | Admitting: Pharmacist

## 2018-03-11 MED ORDER — APIXABAN 5 MG PO TABS: 5.0000 mg | ORAL_TABLET | Freq: Two times a day (BID) | ORAL | 4 refills | Status: DC

## 2018-03-12 ENCOUNTER — Other Ambulatory Visit: Payer: Self-pay | Admitting: *Deleted

## 2018-03-12 DIAGNOSIS — E119 Type 2 diabetes mellitus without complications: Secondary | ICD-10-CM

## 2018-03-12 MED ORDER — VALSARTAN 40 MG PO TABS: 20.0000 mg | ORAL_TABLET | Freq: Every day | ORAL | 2 refills | Status: DC

## 2018-03-12 MED ORDER — ONETOUCH DELICA LANCETS 33G MISC: 3 refills | Status: DC

## 2018-03-15 ENCOUNTER — Other Ambulatory Visit: Payer: Self-pay | Admitting: Pharmacist

## 2018-03-15 MED ORDER — APIXABAN 5 MG PO TABS: 5.0000 mg | ORAL_TABLET | Freq: Two times a day (BID) | ORAL | 1 refills | Status: DC

## 2018-03-20 ENCOUNTER — Telehealth: Payer: Self-pay | Admitting: *Deleted

## 2018-03-20 MED ORDER — VALSARTAN 40 MG PO TABS: 20.0000 mg | ORAL_TABLET | Freq: Every day | ORAL | 2 refills | Status: DC

## 2018-03-20 NOTE — Telephone Encounter (Signed)
Verbal given of 1/2 tab of 40 mg tab (20 mg) Rx corrected in system and set to no print for next refill

## 2018-03-28 ENCOUNTER — Ambulatory Visit: Payer: Medicare Other | Admitting: Neurology

## 2018-04-26 ENCOUNTER — Ambulatory Visit (INDEPENDENT_AMBULATORY_CARE_PROVIDER_SITE_OTHER): Payer: Medicare Other | Admitting: *Deleted

## 2018-04-26 ENCOUNTER — Other Ambulatory Visit: Payer: Self-pay | Admitting: Family

## 2018-04-26 ENCOUNTER — Encounter: Payer: Self-pay | Admitting: *Deleted

## 2018-04-26 VITALS — BP 141/70 | HR 77 | Ht 66.0 in | Wt 237.0 lb

## 2018-04-26 DIAGNOSIS — Z1212 Encounter for screening for malignant neoplasm of rectum: Secondary | ICD-10-CM | POA: Diagnosis not present

## 2018-04-26 DIAGNOSIS — Z0000 Encounter for general adult medical examination without abnormal findings: Secondary | ICD-10-CM

## 2018-04-26 DIAGNOSIS — Z1211 Encounter for screening for malignant neoplasm of colon: Secondary | ICD-10-CM

## 2018-04-26 MED ORDER — APIXABAN 5 MG PO TABS: 5.0000 mg | ORAL_TABLET | Freq: Two times a day (BID) | ORAL | 3 refills | Status: DC

## 2018-04-26 NOTE — Progress Notes (Addendum)
Subjective:   Matthew May is a 72 y.o. male who presents for a subsequent Medicare Annual Wellness Visit.  Patient Care Team: Sharion Balloon, FNP as PCP - General (Nurse Practitioner) Minus Breeding, MD as PCP - Cardiology (Cardiology) Minus Breeding, MD as Consulting Physician (Cardiology)  Hospitalizations, surgeries, and ER visits in previous 12 months ED to admission 12/30/17 for near syncopal episode and cardiac valve disease. ED visit on 11/30/17 for hyperglycemia and on 06/15/17 for sciatica and left side pain.   Review of Systems    Patient reports that his overall health is unchanged compared to last year.  Cardiac Risk Factors include: advanced age (>68men, >77 women);diabetes mellitus;dyslipidemia;hypertension;male gender;obesity (BMI >30kg/m2);sedentary lifestyle(hx of CAD). Has Afib and is on eliquis. Concerned about aortic valve stenosis and insufficiency. Lack of education and understanding related to this is causing some anxiety and worry. He has a f/u with cardiology in January and will make a list of his questions and concerns to talk with the doctor about. Also has bouts of chest pain ever couple of weeks. No precipitating factors. He takes nitro for the pain but not every time. Hasn't found the nitro to be very effective. Has not taken over 2 per episode and always spaces them out more than 5 minutes. He knows to call 911 if a 3rd is necessary.   All other systems negative     Current Medications (verified) Outpatient Encounter Medications as of 04/26/2018  Medication Sig  . albuterol (PROVENTIL HFA;VENTOLIN HFA) 108 (90 Base) MCG/ACT inhaler Inhale 2 puffs into the lungs every 6 (six) hours as needed for wheezing or shortness of breath.  Marland Kitchen apixaban (ELIQUIS) 5 MG TABS tablet Take 1 tablet (5 mg total) by mouth 2 (two) times daily.  Marland Kitchen atorvastatin (LIPITOR) 40 MG tablet TAKE 1 TABLET BY MOUTH  DAILY  . fluticasone furoate-vilanterol (BREO ELLIPTA) 100-25  MCG/INH AEPB Inhale 1 puff into the lungs daily.  Marland Kitchen levothyroxine (SYNTHROID, LEVOTHROID) 125 MCG tablet TAKE 1 TABLET BY MOUTH  DAILY  . metFORMIN (GLUCOPHAGE) 1000 MG tablet TAKE 1 TABLET BY MOUTH TWO  TIMES DAILY  . nitroGLYCERIN (NITROSTAT) 0.4 MG SL tablet DISSOLVE ONE TABLET UNDER THE TONGUE EVERY 5 MINUTES AS NEEDED FOR CHEST PAIN.  DO NOT EXCEED A TOTAL OF 3 DOSES IN 15 MINUTES  . ONETOUCH DELICA LANCETS 29J MISC USE ONE DAILY  . ONETOUCH VERIO test strip TEST EVERY DAY  . potassium chloride SA (K-DUR,KLOR-CON) 20 MEQ tablet TAKE 1 TABLET BY MOUTH  EVERY DAY  . torsemide (DEMADEX) 20 MG tablet Take 20 mg by mouth daily.  . valsartan (DIOVAN) 40 MG tablet Take 0.5 tablets (20 mg total) by mouth daily.  . Vitamin D, Ergocalciferol, (DRISDOL) 50000 units CAPS capsule Take 1 capsule (50,000 Units total) by mouth once a week.   No facility-administered encounter medications on file as of 04/26/2018.     Allergies (verified) Prednisone; Jardiance [empagliflozin]; and Lisinopril   History: Past Medical History:  Diagnosis Date  . Aortic stenosis   . Atrial fibrillation ([place])   . CAD (coronary artery disease)    a. s/p NSTEMI 4/11 => s/p CABG (L-LAD, S-OM2, S-PDA/PL);  b.  ETT-Myoview 6/14:  normal study, no ischemia, EF 67%  . Carotid stenosis    Carotid U/S 1/88:  RICA 4-16%, LICA 60-63%; right vertebral flow retrograde suggestive of steel-consider PV consult  . COPD (chronic obstructive pulmonary disease) ([place])   . HLD (hyperlipidemia)   .  HTN (hypertension)   . Hx of echocardiogram    Echo 6/14: Mod LVH, focal basal hypertrophy, EF 50-55%, mild AS (mean 17 mmHg) and mild AI, mild to mod LAE, mild RAE  . Obesity   . Renal insufficiency   . Subclavian artery stenosis, right ([place])    based upon carotid U/S done 11/2012   Past Surgical History:  Procedure Laterality Date  . CORONARY ARTERY BYPASS GRAFT     2011, LIMA to LAD coronary artery, SVG to OM2 branch of lect circumflex  coronary artery, and a sequential SVG to psot descening to posterolateral branches to RCA  . endoscopic vein harvesting     right leg   . HERNIA REPAIR    . RIGHT/LEFT HEART CATH AND CORONARY/GRAFT ANGIOGRAPHY N/A 12/31/2017   Procedure: RIGHT/LEFT HEART CATH AND CORONARY/GRAFT ANGIOGRAPHY;  Surgeon: Leonie Man, MD;  Location: [facility];  Service: Cardiovascular;  Laterality: N/A;  . TONSILLECTOMY     Family History  Problem Relation Age of Onset  . Heart disease Father   . Heart attack Father   . Mental illness Mother   . Mental illness Sister   . Diabetes Brother   . Depression Maternal Aunt    Social History   Socioeconomic History  . Marital status: Married    Spouse name: Not on file  . Number of children: 3  . Years of education: Not on file  . Highest education level: Not on file  Occupational History  . Occupation: Retired  Scientific laboratory technician  . Financial resource strain: Not very hard  . Food insecurity:    Worry: Never true    Inability: Never true  . Transportation needs:    Medical: No    Non-medical: No  Tobacco Use  . Smoking status: Former Smoker    Last attempt to quit: 07/11/2009    Years since quitting: 8.7  . Smokeless tobacco: Never Used  . Tobacco comment: smoked about 10 cig/day; used to smoke 2 ppd for many years   Substance and Sexual Activity  . Alcohol use: No    Alcohol/week: 0.0 standard drinks  . Drug use: No  . Sexual activity: Never  Lifestyle  . Physical activity:    Days per week: 0 days    Minutes per session: Not on file  . Stress: Only a little  Relationships  . Social connections:    Talks on phone: More than three times a week    Gets together: More than three times a week    Attends religious service: Never    Active member of club or organization: No    Attends meetings of clubs or organizations: Never    Relationship status: Married  Other Topics Concern  . Not on file  Social History Narrative   Lives at  home with his wife.      Clinical Intake:  Pre-visit preparation completed: No  Pain : No/denies pain     Nutritional Status: (3 meals and breakfast is usually light. Eats a combination of eating at home and eating out. Wife's cooks supper in the evening. Drinks mostly water and may add a packet of flavoring.  ) Nutritional Risks: None Diabetes: No CBG done?: No Did pt. bring in CBG monitor from home?: No  How often do you need to have someone help you when you read instructions, pamphlets, or other written materials from your doctor or pharmacy?: 1 - Never What is the last grade level you completed  in school?: 8th  Interpreter Needed?: No  Information entered by :: Chong Sicilian, RN   Activities of Daily Living In your present state of health, do you have any difficulty performing the following activities: 04/26/2018 12/30/2017  Hearing? N N  Vision? Y N  Difficulty concentrating or making decisions? N N  Walking or climbing stairs? N Y  Dressing or bathing? N N  Doing errands, shopping? N N  Preparing Food and eating ? N -  Using the Toilet? N -  In the past six months, have you accidently leaked urine? N -  Do you have problems with loss of bowel control? N -  Managing your Medications? N -  Managing your Finances? N -  Housekeeping or managing your Housekeeping? N -  Some recent data might be hidden     Exercise Current Exercise Habits: The patient does not participate in regular exercise at present, Exercise limited by: cardiac condition(s)   Depression Screen PHQ 2/9 Scores 04/26/2018 03/08/2018 02/05/2018 01/30/2018 01/02/2018 11/26/2017 11/23/2017  PHQ - 2 Score 0 0 0 0 0 0 0     Fall Risk Fall Risk  04/26/2018 03/08/2018 02/05/2018 01/30/2018 01/02/2018  Falls in the past year? 0 No No No No  Number falls in past yr: 0 - - - -  Injury with Fall? 0 - - - -  Risk for fall due to : History of fall(s) - - - -  Risk for fall due to: Comment - - - - -  Follow up  Falls prevention discussed - - - -     Objective:    Today's Vitals   04/26/18 1004  BP: (!) 141/70  Pulse: 77  Weight: 237 lb (107.5 kg)  Height: 5\' 6"  (1.676 m)   Body mass index is 38.25 kg/m.  Advanced Directives 04/26/2018 12/30/2017 06/15/2017 01/18/2017 01/18/2017 01/16/2017 02/23/2016  Does Patient Have a Medical Advance Directive? No No No No No No No  Would patient like information on creating a medical advance directive? No - Patient declined No - Patient declined - No - Patient declined No - Patient declined - No - patient declined information    Hearing/Vision  No hearing or vision deficits noted during visit.  Cognitive Function: MMSE - Mini Mental State Exam 04/25/2017 02/23/2016 09/17/2014  Orientation to time 5 5 5   Orientation to Place 5 5 5   Registration 3 3 3   Attention/ Calculation 4 4 5   Recall 2 2 1   Language- name 2 objects 2 2 2   Language- repeat 1 1 1   Language- follow 3 step command 2 2 3   Language- read & follow direction 1 1 1   Write a sentence 1 1 1   Copy design 1 1 1   Total score 27 27 28      6CIT Screen 04/26/2018  What Year? 0 points  What month? 0 points  What time? 0 points  Count back from 20 0 points  Months in reverse 0 points  Repeat phrase 0 points  Total Score 0   Normal Cognitive Function Screening: Yes    Immunizations and Health Maintenance Immunization History  Administered Date(s) Administered  . Influenza, High Dose Seasonal PF 03/31/2014, 04/24/2015, 03/29/2017, 03/08/2018  . Influenza-Unspecified 02/03/2013, 03/31/2016, 03/29/2017  . Pneumococcal Conjugate-13 09/16/2014  . Pneumococcal Polysaccharide-23 08/06/2012   Health Maintenance Due  Topic Date Due  . Fecal DNA (Cologuard)  08/06/1997   Health Maintenance  Topic Date Due  . Fecal DNA (Cologuard)  08/06/1997  .  HEMOGLOBIN A1C  08/06/2018  . OPHTHALMOLOGY EXAM  01/26/2019  . FOOT EXAM  02/06/2019  . TETANUS/TDAP  10/04/2019  . INFLUENZA VACCINE   Completed  . Hepatitis C Screening  Completed  . PNA vac Low Risk Adult  Completed        Assessment:   This is a routine wellness examination for [facility].    Plan:    Goals    . Exercise 150 min/wk Moderate Activity     Light exercise daily        Health Maintenance & Additional Screening Recommendations: Colorectal cancer screening diabetic eye exam  Lung: Low Dose CT Chest recommended if Age 64-80 years, 30 pack-year currently smoking OR have quit w/in 15years. Patient does not qualify. Hepatitis C Screening recommended: no  Today's Orders Orders Placed This Encounter  Procedures  . Cologuard    Keep f/u with Sharion Balloon, FNP and any other specialty appointments you may have Make a list of questions and concerns for cardiologist. I did go over his echo results and talked about some of his concerns today.  Continue current medications I will send in for patient assistance program in [place] to help with Eliquis Move carefully to avoid falls. Continue to use cane.  Aim for at least 150 minutes of exercise a week. Light exercise for now unless moderate is approved by cardiology.  Read or work on puzzles daily Stay connected with friends and family and stay active  I have personally reviewed and noted the following in the patient's chart:   . Medical and social history . Use of alcohol, tobacco or illicit drugs  . Current medications and supplements . Functional ability and status . Nutritional status . Physical activity . Advanced directives . List of other physicians . Hospitalizations, surgeries, and ER visits in previous 12 months . Vitals . Screenings to include cognitive, depression, and falls . Referrals and appointments  In addition, I have reviewed and discussed with patient certain preventive protocols, quality metrics, and best practice recommendations. A written personalized care plan for preventive services as well as general preventive  health recommendations were provided to patient.     Chong Sicilian, RN   04/26/2018    I have reviewed and agree with the above AWV documentation.   Evelina Dun, FNP

## 2018-04-26 NOTE — Patient Instructions (Addendum)
  Matthew May , Thank you for taking time to come for your Medicare Wellness Visit. I appreciate your ongoing commitment to your health goals. Please review the following plan we discussed and let me know if I can assist you in the future.   These are the goals we discussed: Goals    . Exercise 150 min/wk Moderate Activity     Light exercise daily       This is a list of the screening recommended for you and due dates:  Health Maintenance  Topic Date Due  . Colon Cancer Screening  04/27/2019*  . Stool Blood Test  06/18/2018  . Hemoglobin A1C  08/06/2018  . Eye exam for diabetics  01/26/2019  . Complete foot exam   02/06/2019  . Tetanus Vaccine  10/04/2019  . Flu Shot  Completed  .  Hepatitis C: One time screening is recommended by Center for Disease Control  (CDC) for  adults born from [birth year] through 1963.   Completed  . Pneumonia vaccines  Completed  *Topic was postponed. The date shown is not the original due date.      Your doctor has prescribed Cologuard, an easy-to-use, noninvasive test for colon cancer screening, based on the latest advances in stool DNA science.   Here's what will happen next:  1. You may receive a call or email from [employer] to confirm your mailing address and insurance information 2. Your kit will be shipped directly to you 3. You collect your stool sample in the privacy of your own home 4. You return the kit via [facility] shipping or pick-up, in the same box it arrived in 5. You doctor will contact you with the results once they are available  Screening for colon cancer is very important to your good health, so if you have any questions at all, please call [employer]'s Customer Support Specialists at [phone]. They are available 24 hours a day, 6 days a week.   You should receive call from the Patient Assistance Program in [place].

## 2018-05-22 ENCOUNTER — Other Ambulatory Visit: Payer: Self-pay | Admitting: Family

## 2018-06-10 ENCOUNTER — Ambulatory Visit (INDEPENDENT_AMBULATORY_CARE_PROVIDER_SITE_OTHER): Payer: Medicare Other | Admitting: Family

## 2018-06-10 ENCOUNTER — Encounter: Payer: Self-pay | Admitting: Family

## 2018-06-10 VITALS — BP 124/65 | HR 68 | Temp 96.8°F | Ht 66.0 in | Wt 240.0 lb

## 2018-06-10 DIAGNOSIS — E038 Other specified hypothyroidism: Secondary | ICD-10-CM | POA: Diagnosis not present

## 2018-06-10 DIAGNOSIS — E1159 Type 2 diabetes mellitus with other circulatory complications: Secondary | ICD-10-CM

## 2018-06-10 DIAGNOSIS — E785 Hyperlipidemia, unspecified: Secondary | ICD-10-CM

## 2018-06-10 DIAGNOSIS — I480 Paroxysmal atrial fibrillation: Secondary | ICD-10-CM

## 2018-06-10 DIAGNOSIS — Z1212 Encounter for screening for malignant neoplasm of rectum: Secondary | ICD-10-CM

## 2018-06-10 DIAGNOSIS — E1165 Type 2 diabetes mellitus with hyperglycemia: Secondary | ICD-10-CM

## 2018-06-10 DIAGNOSIS — E559 Vitamin D deficiency, unspecified: Secondary | ICD-10-CM

## 2018-06-10 DIAGNOSIS — E1169 Type 2 diabetes mellitus with other specified complication: Secondary | ICD-10-CM | POA: Diagnosis not present

## 2018-06-10 DIAGNOSIS — Z1211 Encounter for screening for malignant neoplasm of colon: Secondary | ICD-10-CM

## 2018-06-10 DIAGNOSIS — I10 Essential (primary) hypertension: Secondary | ICD-10-CM

## 2018-06-10 DIAGNOSIS — I2510 Atherosclerotic heart disease of native coronary artery without angina pectoris: Secondary | ICD-10-CM | POA: Diagnosis not present

## 2018-06-10 DIAGNOSIS — I152 Hypertension secondary to endocrine disorders: Secondary | ICD-10-CM

## 2018-06-10 LAB — BAYER DCA HB A1C WAIVED: HB A1C (BAYER DCA - WAIVED): 8.2 % — ABNORMAL HIGH (ref <7.0)

## 2018-06-10 NOTE — Patient Instructions (Signed)
Diabetes Mellitus and Nutrition, Adult  When you have diabetes (diabetes mellitus), it is very important to have healthy eating habits because your blood sugar (glucose) levels are greatly affected by what you eat and drink. Eating healthy foods in the appropriate amounts, at about the same times every day, can help you:  · Control your blood glucose.  · Lower your risk of heart disease.  · Improve your blood pressure.  · Reach or maintain a healthy weight.  Every person with diabetes is different, and each person has different needs for a meal plan. Your health care provider may recommend that you work with a diet and nutrition specialist (dietitian) to make a meal plan that is best for you. Your meal plan may vary depending on factors such as:  · The calories you need.  · The medicines you take.  · Your weight.  · Your blood glucose, blood pressure, and cholesterol levels.  · Your activity level.  · Other health conditions you have, such as heart or kidney disease.  How do carbohydrates affect me?  Carbohydrates, also called carbs, affect your blood glucose level more than any other type of food. Eating carbs naturally raises the amount of glucose in your blood. Carb counting is a method for keeping track of how many carbs you eat. Counting carbs is important to keep your blood glucose at a healthy level, especially if you use insulin or take certain oral diabetes medicines.  It is important to know how many carbs you can safely have in each meal. This is different for every person. Your dietitian can help you calculate how many carbs you should have at each meal and for each snack.  Foods that contain carbs include:  · Bread, cereal, rice, pasta, and crackers.  · Potatoes and corn.  · Peas, beans, and lentils.  · Milk and yogurt.  · Fruit and juice.  · Desserts, such as cakes, cookies, ice cream, and candy.  How does alcohol affect me?  Alcohol can cause a sudden decrease in blood glucose (hypoglycemia),  especially if you use insulin or take certain oral diabetes medicines. Hypoglycemia can be a life-threatening condition. Symptoms of hypoglycemia (sleepiness, dizziness, and confusion) are similar to symptoms of having too much alcohol.  If your health care provider says that alcohol is safe for you, follow these guidelines:  · Limit alcohol intake to no more than 1 drink per day for nonpregnant women and 2 drinks per day for men. One drink equals 12 oz of beer, 5 oz of wine, or 1½ oz of hard liquor.  · Do not drink on an empty stomach.  · Keep yourself hydrated with water, diet soda, or unsweetened iced tea.  · Keep in mind that regular soda, juice, and other mixers may contain a lot of sugar and must be counted as carbs.  What are tips for following this plan?    Reading food labels  · Start by checking the serving size on the "Nutrition Facts" label of packaged foods and drinks. The amount of calories, carbs, fats, and other nutrients listed on the label is based on one serving of the item. Many items contain more than one serving per package.  · Check the total grams (g) of carbs in one serving. You can calculate the number of servings of carbs in one serving by dividing the total carbs by 15. For example, if a food has 30 g of total carbs, it would be equal to 2   servings of carbs.  · Check the number of grams (g) of saturated and trans fats in one serving. Choose foods that have low or no amount of these fats.  · Check the number of milligrams (mg) of salt (sodium) in one serving. Most people should limit total sodium intake to less than 2,300 mg per day.  · Always check the nutrition information of foods labeled as "low-fat" or "nonfat". These foods may be higher in added sugar or refined carbs and should be avoided.  · Talk to your dietitian to identify your daily goals for nutrients listed on the label.  Shopping  · Avoid buying canned, premade, or processed foods. These foods tend to be high in fat, sodium,  and added sugar.  · Shop around the outside edge of the grocery store. This includes fresh fruits and vegetables, bulk grains, fresh meats, and fresh dairy.  Cooking  · Use low-heat cooking methods, such as baking, instead of high-heat cooking methods like deep frying.  · Cook using healthy oils, such as olive, canola, or sunflower oil.  · Avoid cooking with butter, cream, or high-fat meats.  Meal planning  · Eat meals and snacks regularly, preferably at the same times every day. Avoid going long periods of time without eating.  · Eat foods high in fiber, such as fresh fruits, vegetables, beans, and whole grains. Talk to your dietitian about how many servings of carbs you can eat at each meal.  · Eat 4-6 ounces (oz) of lean protein each day, such as lean meat, chicken, fish, eggs, or tofu. One oz of lean protein is equal to:  ? 1 oz of meat, chicken, or fish.  ? 1 egg.  ? ¼ cup of tofu.  · Eat some foods each day that contain healthy fats, such as avocado, nuts, seeds, and fish.  Lifestyle  · Check your blood glucose regularly.  · Exercise regularly as told by your health care provider. This may include:  ? 150 minutes of moderate-intensity or vigorous-intensity exercise each week. This could be brisk walking, biking, or water aerobics.  ? Stretching and doing strength exercises, such as yoga or weightlifting, at least 2 times a week.  · Take medicines as told by your health care provider.  · Do not use any products that contain nicotine or tobacco, such as cigarettes and e-cigarettes. If you need help quitting, ask your health care provider.  · Work with a counselor or diabetes educator to identify strategies to manage stress and any emotional and social challenges.  Questions to ask a health care provider  · Do I need to meet with a diabetes educator?  · Do I need to meet with a dietitian?  · What number can I call if I have questions?  · When are the best times to check my blood glucose?  Where to find more  information:  · American Diabetes Association: diabetes.org  · Academy of Nutrition and Dietetics: www.eatright.org  · National Institute of Diabetes and Digestive and Kidney Diseases (NIH): www.niddk.nih.gov  Summary  · A healthy meal plan will help you control your blood glucose and maintain a healthy lifestyle.  · Working with a diet and nutrition specialist (dietitian) can help you make a meal plan that is best for you.  · Keep in mind that carbohydrates (carbs) and alcohol have immediate effects on your blood glucose levels. It is important to count carbs and to use alcohol carefully.  This information is not intended to   replace advice given to you by your health care provider. Make sure you discuss any questions you have with your health care provider.  Document Released: 02/16/2005 Document Revised: 12/20/2016 Document Reviewed: 06/26/2016  Elsevier Interactive Patient Education © 2019 [employer]

## 2018-06-10 NOTE — Progress Notes (Signed)
Subjective:    Patient ID: Matthew May, male    DOB: August 23, 1945, 73 y.o.   MRN: [id number]  Chief Complaint  Patient presents with  . Medical Management of Chronic Issues   Pt presents to the office today chronic follow up. Pt is followed by Cardiologistsevery 3-6 months for CAD and A Fib. These are stable at this time.   He reports his dizziness is improved. Pt was seen on 01/30/18 and we stopped his Jardiance related to dehydration and yeast infection. He reports he is feeling much better, but still has some in the morning after taking his medications for a few hours.  Hypertension  This is a chronic problem. The current episode started more than 1 year ago. The problem has been resolved since onset. The problem is controlled. Associated symptoms include malaise/fatigue. Pertinent negatives include no blurred vision, headaches, peripheral edema or shortness of breath. Risk factors for coronary artery disease include dyslipidemia, obesity, male gender and sedentary lifestyle. The current treatment provides moderate improvement. Hypertensive end-organ damage includes CAD/MI. There is no history of heart failure. Identifiable causes of hypertension include a thyroid problem.  Diabetes  He presents for his follow-up diabetic visit. He has type 2 diabetes mellitus. There are no hypoglycemic associated symptoms. Pertinent negatives for hypoglycemia include no headaches. Associated symptoms include fatigue. Pertinent negatives for diabetes include no blurred vision, no foot paresthesias, no foot ulcerations and no visual change. There are no hypoglycemic complications. Pertinent negatives for diabetic complications include no heart disease. Risk factors for coronary artery disease include dyslipidemia, diabetes mellitus, male sex, hypertension and sedentary lifestyle. He is following a generally unhealthy diet. His overall blood glucose range is 180-200 mg/dl. Eye exam is current.  Hyperlipidemia    This is a chronic problem. The current episode started more than 1 year ago. The problem is controlled. Recent lipid tests were reviewed and are normal. Exacerbating diseases include obesity. Pertinent negatives include no shortness of breath. Current antihyperlipidemic treatment includes statins. The current treatment provides moderate improvement of lipids. Risk factors for coronary artery disease include dyslipidemia, diabetes mellitus, hypertension, male sex and a sedentary lifestyle.  Thyroid Problem  Presents for follow-up visit. Symptoms include fatigue and hoarse voice. Patient reports no constipation, diarrhea or visual change. The symptoms have been stable. His past medical history is significant for hyperlipidemia. There is no history of heart failure.     Review of Systems  Constitutional: Positive for fatigue and malaise/fatigue.  HENT: Positive for hoarse voice.   Eyes: Negative for blurred vision.  Respiratory: Negative for shortness of breath.   Gastrointestinal: Negative for constipation and diarrhea.  Neurological: Negative for headaches.  All other systems reviewed and are negative.      Objective:   Physical Exam Vitals signs reviewed.  Constitutional:      General: He is not in acute distress.    Appearance: He is well-developed.  HENT:     Head: Normocephalic.     Right Ear: Tympanic membrane normal.     Left Ear: Tympanic membrane normal.  Eyes:     General:        Right eye: No discharge.        Left eye: No discharge.     Pupils: Pupils are equal, round, and reactive to light.  Neck:     Musculoskeletal: Normal range of motion and neck supple.     Thyroid: No thyromegaly.  Cardiovascular:     Rate and Rhythm: Normal rate and  regular rhythm.     Heart sounds: Murmur present.  Pulmonary:     Effort: Pulmonary effort is normal. No respiratory distress.     Breath sounds: Normal breath sounds. No wheezing.  Abdominal:     General: Bowel sounds are  normal. There is no distension.     Palpations: Abdomen is soft.     Tenderness: There is no abdominal tenderness.  Musculoskeletal: Normal range of motion.        General: No tenderness.     Right lower leg: Edema (trace) present.     Left lower leg: Edema (trace) present.  Skin:    General: Skin is warm and dry.     Findings: No erythema or rash.  Neurological:     Mental Status: He is alert and oriented to person, place, and time.     Cranial Nerves: No cranial nerve deficit.     Deep Tendon Reflexes: Reflexes are normal and symmetric.  Psychiatric:        Behavior: Behavior normal.        Thought Content: Thought content normal.        Judgment: Judgment normal.     BP 124/65   Pulse 68   Temp (!) 96.8 F (36 C) (Oral)   Ht _0  (1.676 m)   Wt 240 lb (108.9 kg)   BMI 38.74 kg/m      Assessment & Plan:  Matthew May comes in today with chief complaint of Medical Management of Chronic Issues   Diagnosis and orders addressed:  1. Hypertension associated with diabetes ([place]) - CMP14+EGFR - CBC with Differential/Platelet  2. Atherosclerosis of native coronary artery of native heart without angina pectoris - CMP14+EGFR - CBC with Differential/Platelet - Lipid panel  3. Paroxysmal atrial fibrillation (HCC) - CMP14+EGFR - CBC with Differential/Platelet  4. Hyperlipidemia associated with type 2 diabetes mellitus ([place]) - CMP14+EGFR - CBC with Differential/Platelet - Lipid panel  5. Other specified hypothyroidism - CMP14+EGFR - CBC with Differential/Platelet - TSH  6. Type 2 diabetes mellitus with hyperglycemia, without long-term current use of insulin (HCC) - CMP14+EGFR - CBC with Differential/Platelet - Bayer DCA Hb A1c Waived  7. Morbid obesity ([place]) - CMP14+EGFR - CBC with Differential/Platelet  8. Vitamin D deficiency - CMP14+EGFR - CBC with Differential/Platelet  9. Colon cancer screening - Cologuard  10. Screening for malignant neoplasm  of the rectum - Cologuard   Labs pending Health Maintenance reviewed Diet and exercise encouraged  Follow up plan: 3 months    Evelina Dun, FNP

## 2018-06-10 NOTE — Progress Notes (Signed)
HPI The patient returns for followup of his known coronary disease.   He was in the hospital in July with presyncope.  He had a cath with results as below.  Cath demonstrated moderate AS.   He was sent home and PFTs were obtained.  He did not have any significant findings consistent with lung disease.  He continues to having dizzy spells since his discharge in late July.   I put him on a monitor and he has been found to have atrial fib.  He has been started on Eliquis.    He also had his Torsemide reduced. His dizziness was much improved at the last visit.    Since I last saw him he says he does much better.  He is not having the dizziness he was having.  He does not think he is short of breath.  He makes canes and does a little stuff around his yard but is not overly active.  He says he walks 300 yards to get his newspaper and then back every day.  He can do that without chest pain or shortness of breath and less of a very cold outside and he says he gets some burning in his lungs.  However, this is not new and it is a chronic problem.  He has not had any presyncope or syncope.  He is tolerating his anticoagulation.  He has had no PND or orthopnea.  He has some minor chronic edema in his ankles.   Allergies  Allergen Reactions  . Prednisone Other (See Comments)    Pt states "sugar went to 580"  . Jardiance [Empagliflozin] Rash  . Lisinopril Cough    Current Outpatient Medications  Medication Sig Dispense Refill  . albuterol (PROVENTIL HFA;VENTOLIN HFA) 108 (90 Base) MCG/ACT inhaler Inhale 2 puffs into the lungs every 6 (six) hours as needed for wheezing or shortness of breath. 1 Inhaler 0  . apixaban (ELIQUIS) 5 MG TABS tablet Take 1 tablet (5 mg total) by mouth 2 (two) times daily. 180 tablet 3  . atorvastatin (LIPITOR) 40 MG tablet TAKE 1 TABLET BY MOUTH  DAILY 90 tablet 1  . fluticasone furoate-vilanterol (BREO ELLIPTA) 100-25 MCG/INH AEPB Inhale 1 puff into the lungs daily. 1 each 2    . levothyroxine (SYNTHROID) 150 MCG tablet Take 1 tablet (150 mcg total) by mouth daily. 30 tablet 11  . metFORMIN (GLUCOPHAGE) 1000 MG tablet TAKE 1 TABLET BY MOUTH TWO  TIMES DAILY 180 tablet 0  . nitroGLYCERIN (NITROSTAT) 0.4 MG SL tablet DISSOLVE ONE TABLET UNDER THE TONGUE EVERY 5 MINUTES AS NEEDED FOR CHEST PAIN.  DO NOT EXCEED A TOTAL OF 3 DOSES IN 15 MINUTES 25 tablet 3  . potassium chloride SA (K-DUR,KLOR-CON) 20 MEQ tablet TAKE 1 TABLET BY MOUTH  EVERY DAY 90 tablet 2  . sitaGLIPtin (JANUVIA) 50 MG tablet Take 1 tablet (50 mg total) by mouth daily. 90 tablet 1  . torsemide (DEMADEX) 20 MG tablet TAKE 2 TABLETS BY MOUTH  DAILY 180 tablet 0  . valsartan (DIOVAN) 40 MG tablet Take 0.5 tablets (20 mg total) by mouth daily. 45 tablet 2  . Vitamin D, Ergocalciferol, (DRISDOL) 50000 units CAPS capsule Take 1 capsule (50,000 Units total) by mouth once a week. 12 capsule 1  . ONETOUCH DELICA LANCETS 97D MISC USE ONE DAILY 100 each 3  . ONETOUCH VERIO test strip TEST EVERY DAY 100 each 10   No current facility-administered medications for this visit.  Past Medical History:  Diagnosis Date  . Aortic stenosis   . Atrial fibrillation ([place])   . CAD (coronary artery disease)    a. s/p NSTEMI 4/11 => s/p CABG (L-LAD, S-OM2, S-PDA/PL);  b.  ETT-Myoview 6/14:  normal study, no ischemia, EF 67%  . Carotid stenosis    Carotid U/S 2/95:  RICA 6-21%, LICA 30-86%; right vertebral flow retrograde suggestive of steel-consider PV consult  . COPD (chronic obstructive pulmonary disease) ([place])   . HLD (hyperlipidemia)   . HTN (hypertension)   . Hx of echocardiogram    Echo 6/14: Mod LVH, focal basal hypertrophy, EF 50-55%, mild AS (mean 17 mmHg) and mild AI, mild to mod LAE, mild RAE  . Obesity   . Renal insufficiency   . Subclavian artery stenosis, right ([place])    based upon carotid U/S done 11/2012    Past Surgical History:  Procedure Laterality Date  . CORONARY ARTERY BYPASS GRAFT     2011,  LIMA to LAD coronary artery, SVG to OM2 branch of lect circumflex coronary artery, and a sequential SVG to psot descening to posterolateral branches to RCA  . endoscopic vein harvesting     right leg   . HERNIA REPAIR    . RIGHT/LEFT HEART CATH AND CORONARY/GRAFT ANGIOGRAPHY N/A 12/31/2017   Procedure: RIGHT/LEFT HEART CATH AND CORONARY/GRAFT ANGIOGRAPHY;  Surgeon: Leonie Man, MD;  Location: [facility];  Service: Cardiovascular;  Laterality: N/A;  . TONSILLECTOMY      ROS:  As stated in the HPI and negative for all other systems.  PHYSICAL EXAM BP 124/76   Pulse 72   Ht 5\' 6"  (1.676 m)   Wt 237 lb (107.5 kg)   BMI 38.25 kg/m   GENERAL:  Well appearing NECK:  No jugular venous distention, waveform within normal limits, carotid upstroke brisk and symmetric, no bruits, no thyromegaly LUNGS:  Clear to auscultation bilaterally CHEST:  Unremarkable HEART:  PMI not displaced or sustained,S1 and S2 within normal limits, no S3, no S4, no clicks, no rubs, 3 out of 6 apical systolic murmur radiating at the aortic outflow tract and mid peaking, no diastolic murmurs ABD:  Flat, positive bowel sounds normal in frequency in pitch, no bruits, no rebound, no guarding, no midline pulsatile mass, no hepatomegaly, no splenomegaly EXT:  2 plus pulses throughout, mild ankle edema, no cyanosis no clubbing, chronic venous stasis changes   EKG: Sinus rhythm, rate 72, axis within normal limits, intervals within normal limits, premature ventricular contractions, nonspecific inferior T wave flattening.   Lab Results  Component Value Date   CHOL 123 06/10/2018   TRIG 127 06/10/2018   HDL 29 (L) 06/10/2018   LDLCALC 69 06/10/2018    Cath:  Conclusion    Prox RCA to Dist RCA lesion is 100% stenosed.  Seq SVG- RPDA-RPL graft was visualized by angiography and is large. The graft exhibits no disease.  Prox Cx lesion is 95% stenosed. Mid Cx lesion is 100% stenosed beyond OM2  SVG-Cx graft  was visualized by angiography and is normal in caliber. Origin to Prox Graft lesion is 100% stenosed - flush  Mid LAD lesion is 100% stenosed.  LIMA-LAD graft was visualized by angiography and is normal in caliber. The graft exhibits no disease.  Lat 1st Diag lesion is 90% stenosed.  RHC NUMBERS reviewed: LV end diastolic pressure is normal.- 12 mmHg; LV end diastolic pressure is normal.  There is moderate aortic valve stenosis.   Moderate aortic  stenosis by catheterization, however there is significant respiratory variation and beat-to-beat variation of blood pressures making it very difficult to Adequately Assess.  Severe native CAD with occluded mid RCA, subtotal occluded proximal circumflex after small OM branch, totally occluded LAD after D2.   Widely patent LIMA-LAD -Just after D2  Widely patent sequential SVG-RPDA-RPL1  Flush occlusion of SVG-OM   Right Heart Pressures PA P: 25/11 mmHg-mean 17 mmHg. PCWP: 9 mmHg. LV EDP is normal. LVP -EDP: 195/3 mmHg, 14 mmHg; 173/0 mmHg-14 million mercury  Right Atrium The right atrium is moderately dilated. Right atrial pressure is normal. RAP 4 mmHg  Right Ventricle The right ventricle is mildly dilated. RVP-EDP: 34/0 mmHg - 4 mmHg      ASSESSMENT AND PLAN  ATRIAL FIB:   [name] has a CHA2DS2 - VASc score of 3.  He is tolerating the Eliquis.  No change in therapy.   DIZZINESS:   This was much improved with the removal of Jardiance and reduction in the Torsemide.  No therapy or further evaluation is indicated.   CORONARY ATHEROSCLEROSIS NATIVE CORONARY ARTERY - He had an occluded vein graft as described in the anatomy above.    He will continue with risk reduction.   HYPERTENSION -  The blood pressure is at target.  No change in therapy.   DYSLIPIDEMIA -   LDL was 69.  He will continue meds as listed.  I reviewed the meds as listed above.  I reviewed the new labs as listed here.  He is on a good regimen and  will keep on this.  AS - This was moderate on cath as above.  I think we have proven that this moderate left AS is not because of the previous dizziness or dyspnea and that it can be followed clinically. casions.  CAROTID STENOSIS - This is followed by [facility].    OBESITY - We have talked about specifics around trying to lose weight and has been unsuccessful.  PVCs - He is not feeling these.  No change in therapy.  DM - His last A1c was 8.2.  He understands this is not at target and he needs to work with Sharion Balloon, FNP

## 2018-06-11 ENCOUNTER — Other Ambulatory Visit: Payer: Self-pay | Admitting: Family

## 2018-06-11 LAB — LIPID PANEL
Chol/HDL Ratio: 4.2 ratio (ref 0.0–5.0)
Cholesterol, Total: 123 mg/dL (ref 100–199)
HDL: 29 mg/dL — ABNORMAL LOW (ref >39)
LDL Calculated: 69 mg/dL (ref 0–99)
Triglycerides: 127 mg/dL (ref 0–149)
VLDL Cholesterol Cal: 25 mg/dL (ref 5–40)

## 2018-06-11 LAB — CBC WITH DIFFERENTIAL/PLATELET
Basophils Absolute: 0 10*3/uL (ref 0.0–0.2)
Basos: 1 %
EOS (ABSOLUTE): 0.2 10*3/uL (ref 0.0–0.4)
Eos: 3 %
Hematocrit: 40.8 % (ref 37.5–51.0)
Hemoglobin: 14.1 g/dL (ref 13.0–17.7)
Immature Grans (Abs): 0.1 10*3/uL (ref 0.0–0.1)
Immature Granulocytes: 1 %
Lymphocytes Absolute: 1.8 10*3/uL (ref 0.7–3.1)
Lymphs: 22 %
MCH: 30.5 pg (ref 26.6–33.0)
MCHC: 34.6 g/dL (ref 31.5–35.7)
MCV: 88 fL (ref 79–97)
Monocytes Absolute: 0.5 10*3/uL (ref 0.1–0.9)
Monocytes: 6 %
Neutrophils Absolute: 5.6 10*3/uL (ref 1.4–7.0)
Neutrophils: 67 %
Platelets: 174 10*3/uL (ref 150–450)
RBC: 4.63 x10E6/uL (ref 4.14–5.80)
RDW: 14 % (ref 11.6–15.4)
WBC: 8.1 10*3/uL (ref 3.4–10.8)

## 2018-06-11 LAB — CMP14+EGFR
ALT: 37 IU/L (ref 0–44)
AST: 26 IU/L (ref 0–40)
Albumin/Globulin Ratio: 1.8 (ref 1.2–2.2)
Albumin: 4.3 g/dL (ref 3.5–4.8)
Alkaline Phosphatase: 73 IU/L (ref 39–117)
BUN/Creatinine Ratio: 16 (ref 10–24)
BUN: 15 mg/dL (ref 8–27)
Bilirubin Total: 0.4 mg/dL (ref 0.0–1.2)
CO2: 22 mmol/L (ref 20–29)
Calcium: 9.4 mg/dL (ref 8.6–10.2)
Chloride: 99 mmol/L (ref 96–106)
Creatinine, Ser: 0.95 mg/dL (ref 0.76–1.27)
GFR calc Af Amer: 93 mL/min/{1.73_m2} (ref >59)
GFR calc non Af Amer: 81 mL/min/{1.73_m2} (ref >59)
Globulin, Total: 2.4 g/dL (ref 1.5–4.5)
Glucose: 162 mg/dL — ABNORMAL HIGH (ref 65–99)
Potassium: 4.6 mmol/L (ref 3.5–5.2)
Sodium: 140 mmol/L (ref 134–144)
Total Protein: 6.7 g/dL (ref 6.0–8.5)

## 2018-06-11 LAB — TSH: TSH: 5.31 u[IU]/mL — ABNORMAL HIGH (ref 0.450–4.500)

## 2018-06-11 MED ORDER — SITAGLIPTIN PHOSPHATE 50 MG PO TABS: 50.0000 mg | ORAL_TABLET | Freq: Every day | ORAL | 1 refills | Status: DC

## 2018-06-11 MED ORDER — LEVOTHYROXINE SODIUM 150 MCG PO TABS: 150.0000 ug | ORAL_TABLET | Freq: Every day | ORAL | 11 refills | Status: DC

## 2018-06-12 ENCOUNTER — Ambulatory Visit (INDEPENDENT_AMBULATORY_CARE_PROVIDER_SITE_OTHER): Payer: Medicare Other | Admitting: Cardiology

## 2018-06-12 ENCOUNTER — Encounter: Payer: Self-pay | Admitting: Cardiology

## 2018-06-12 VITALS — BP 124/76 | HR 72 | Ht 66.0 in | Wt 237.0 lb

## 2018-06-12 DIAGNOSIS — E785 Hyperlipidemia, unspecified: Secondary | ICD-10-CM | POA: Diagnosis not present

## 2018-06-12 DIAGNOSIS — I2510 Atherosclerotic heart disease of native coronary artery without angina pectoris: Secondary | ICD-10-CM

## 2018-06-12 DIAGNOSIS — I350 Nonrheumatic aortic (valve) stenosis: Secondary | ICD-10-CM

## 2018-06-12 DIAGNOSIS — I480 Paroxysmal atrial fibrillation: Secondary | ICD-10-CM | POA: Diagnosis not present

## 2018-06-12 DIAGNOSIS — I10 Essential (primary) hypertension: Secondary | ICD-10-CM

## 2018-06-12 NOTE — Patient Instructions (Signed)
Medication Instructions:  The current medical regimen is effective;  continue present plan and medications.  If you need a refill on your cardiac medications before your next appointment, please call your pharmacy.   Follow-Up: Follow up in 6 months with Dr. Hochrein in [place].  You will receive a letter in the mail 2 months before you are due.  Please call us when you receive this letter to schedule your follow up appointment.  Thank you for choosing [facility]!!     

## 2018-06-24 ENCOUNTER — Other Ambulatory Visit: Payer: Self-pay | Admitting: Family

## 2018-06-24 MED ORDER — GLUCOSE BLOOD VI STRP: ORAL_STRIP | 3 refills | Status: DC

## 2018-06-24 NOTE — Telephone Encounter (Signed)
LMOVM refill sent to [facility]

## 2018-06-24 NOTE — Telephone Encounter (Signed)
What is the name of the medication? One Touch Verio Test Strips  Have you contacted your pharmacy to request a refill? Yes  Which pharmacy would you like this sent to? [facility] in [place]   Patient notified that their request is being sent to the clinical staff for review and that they should receive a call once it is complete. If they do not receive a call within 24 hours they can check with their pharmacy or our office.

## 2018-07-02 ENCOUNTER — Other Ambulatory Visit: Payer: Self-pay | Admitting: *Deleted

## 2018-07-02 MED ORDER — LEVOTHYROXINE SODIUM 150 MCG PO TABS: 150.0000 ug | ORAL_TABLET | Freq: Every day | ORAL | 3 refills | Status: DC

## 2018-07-23 ENCOUNTER — Other Ambulatory Visit: Payer: Self-pay | Admitting: Family

## 2018-07-23 DIAGNOSIS — R0789 Other chest pain: Secondary | ICD-10-CM

## 2018-08-01 ENCOUNTER — Other Ambulatory Visit: Payer: Self-pay | Admitting: *Deleted

## 2018-08-01 ENCOUNTER — Telehealth: Payer: Self-pay | Admitting: Family

## 2018-08-01 MED ORDER — SITAGLIPTIN PHOSPHATE 50 MG PO TABS: 50.0000 mg | ORAL_TABLET | Freq: Every day | ORAL | 1 refills | Status: DC

## 2018-08-01 NOTE — Progress Notes (Signed)
Pt requesting RX for Januvia be sent to health dept RX printed and given to pt

## 2018-08-10 ENCOUNTER — Other Ambulatory Visit: Payer: Self-pay | Admitting: Cardiology

## 2018-08-10 ENCOUNTER — Other Ambulatory Visit: Payer: Self-pay | Admitting: Family

## 2018-08-16 ENCOUNTER — Other Ambulatory Visit: Payer: Self-pay

## 2018-08-16 ENCOUNTER — Telehealth: Payer: Self-pay | Admitting: Family

## 2018-08-16 MED ORDER — GLUCOSE BLOOD VI STRP: ORAL_STRIP | 0 refills | Status: DC

## 2018-08-16 NOTE — Telephone Encounter (Signed)
done

## 2018-08-21 ENCOUNTER — Other Ambulatory Visit: Payer: Self-pay | Admitting: Family

## 2018-08-21 NOTE — Telephone Encounter (Signed)
Does patient need to still be taking 50000 unit of Vit D

## 2018-08-27 ENCOUNTER — Other Ambulatory Visit: Payer: Self-pay | Admitting: Family

## 2018-09-09 ENCOUNTER — Ambulatory Visit (INDEPENDENT_AMBULATORY_CARE_PROVIDER_SITE_OTHER): Payer: Medicare Other | Admitting: Family

## 2018-09-09 ENCOUNTER — Encounter: Payer: Self-pay | Admitting: Family

## 2018-09-09 ENCOUNTER — Other Ambulatory Visit: Payer: Self-pay

## 2018-09-09 VITALS — BP 135/61 | HR 68 | Temp 97.3°F | Ht 66.0 in | Wt 239.0 lb

## 2018-09-09 DIAGNOSIS — E038 Other specified hypothyroidism: Secondary | ICD-10-CM

## 2018-09-09 DIAGNOSIS — E1165 Type 2 diabetes mellitus with hyperglycemia: Secondary | ICD-10-CM

## 2018-09-09 DIAGNOSIS — J449 Chronic obstructive pulmonary disease, unspecified: Secondary | ICD-10-CM | POA: Insufficient documentation

## 2018-09-09 DIAGNOSIS — E559 Vitamin D deficiency, unspecified: Secondary | ICD-10-CM

## 2018-09-09 DIAGNOSIS — E1159 Type 2 diabetes mellitus with other circulatory complications: Secondary | ICD-10-CM

## 2018-09-09 DIAGNOSIS — I10 Essential (primary) hypertension: Secondary | ICD-10-CM

## 2018-09-09 DIAGNOSIS — E785 Hyperlipidemia, unspecified: Secondary | ICD-10-CM | POA: Diagnosis not present

## 2018-09-09 DIAGNOSIS — E1169 Type 2 diabetes mellitus with other specified complication: Secondary | ICD-10-CM

## 2018-09-09 DIAGNOSIS — I6523 Occlusion and stenosis of bilateral carotid arteries: Secondary | ICD-10-CM

## 2018-09-09 DIAGNOSIS — I480 Paroxysmal atrial fibrillation: Secondary | ICD-10-CM

## 2018-09-09 DIAGNOSIS — J441 Chronic obstructive pulmonary disease with (acute) exacerbation: Secondary | ICD-10-CM | POA: Insufficient documentation

## 2018-09-09 LAB — BAYER DCA HB A1C WAIVED: HB A1C (BAYER DCA - WAIVED): 8.3 % — ABNORMAL HIGH (ref <7.0)

## 2018-09-09 NOTE — Patient Instructions (Signed)
Diabetes Mellitus and Nutrition, Adult  When you have diabetes (diabetes mellitus), it is very important to have healthy eating habits because your blood sugar (glucose) levels are greatly affected by what you eat and drink. Eating healthy foods in the appropriate amounts, at about the same times every day, can help you:  · Control your blood glucose.  · Lower your risk of heart disease.  · Improve your blood pressure.  · Reach or maintain a healthy weight.  Every person with diabetes is different, and each person has different needs for a meal plan. Your health care provider may recommend that you work with a diet and nutrition specialist (dietitian) to make a meal plan that is best for you. Your meal plan may vary depending on factors such as:  · The calories you need.  · The medicines you take.  · Your weight.  · Your blood glucose, blood pressure, and cholesterol levels.  · Your activity level.  · Other health conditions you have, such as heart or kidney disease.  How do carbohydrates affect me?  Carbohydrates, also called carbs, affect your blood glucose level more than any other type of food. Eating carbs naturally raises the amount of glucose in your blood. Carb counting is a method for keeping track of how many carbs you eat. Counting carbs is important to keep your blood glucose at a healthy level, especially if you use insulin or take certain oral diabetes medicines.  It is important to know how many carbs you can safely have in each meal. This is different for every person. Your dietitian can help you calculate how many carbs you should have at each meal and for each snack.  Foods that contain carbs include:  · Bread, cereal, rice, pasta, and crackers.  · Potatoes and corn.  · Peas, beans, and lentils.  · Milk and yogurt.  · Fruit and juice.  · Desserts, such as cakes, cookies, ice cream, and candy.  How does alcohol affect me?  Alcohol can cause a sudden decrease in blood glucose (hypoglycemia),  especially if you use insulin or take certain oral diabetes medicines. Hypoglycemia can be a life-threatening condition. Symptoms of hypoglycemia (sleepiness, dizziness, and confusion) are similar to symptoms of having too much alcohol.  If your health care provider says that alcohol is safe for you, follow these guidelines:  · Limit alcohol intake to no more than 1 drink per day for nonpregnant women and 2 drinks per day for men. One drink equals 12 oz of beer, 5 oz of wine, or 1½ oz of hard liquor.  · Do not drink on an empty stomach.  · Keep yourself hydrated with water, diet soda, or unsweetened iced tea.  · Keep in mind that regular soda, juice, and other mixers may contain a lot of sugar and must be counted as carbs.  What are tips for following this plan?    Reading food labels  · Start by checking the serving size on the "Nutrition Facts" label of packaged foods and drinks. The amount of calories, carbs, fats, and other nutrients listed on the label is based on one serving of the item. Many items contain more than one serving per package.  · Check the total grams (g) of carbs in one serving. You can calculate the number of servings of carbs in one serving by dividing the total carbs by 15. For example, if a food has 30 g of total carbs, it would be equal to 2   servings of carbs.  · Check the number of grams (g) of saturated and trans fats in one serving. Choose foods that have low or no amount of these fats.  · Check the number of milligrams (mg) of salt (sodium) in one serving. Most people should limit total sodium intake to less than 2,300 mg per day.  · Always check the nutrition information of foods labeled as "low-fat" or "nonfat". These foods may be higher in added sugar or refined carbs and should be avoided.  · Talk to your dietitian to identify your daily goals for nutrients listed on the label.  Shopping  · Avoid buying canned, premade, or processed foods. These foods tend to be high in fat, sodium,  and added sugar.  · Shop around the outside edge of the grocery store. This includes fresh fruits and vegetables, bulk grains, fresh meats, and fresh dairy.  Cooking  · Use low-heat cooking methods, such as baking, instead of high-heat cooking methods like deep frying.  · Cook using healthy oils, such as olive, canola, or sunflower oil.  · Avoid cooking with butter, cream, or high-fat meats.  Meal planning  · Eat meals and snacks regularly, preferably at the same times every day. Avoid going long periods of time without eating.  · Eat foods high in fiber, such as fresh fruits, vegetables, beans, and whole grains. Talk to your dietitian about how many servings of carbs you can eat at each meal.  · Eat 4-6 ounces (oz) of lean protein each day, such as lean meat, chicken, fish, eggs, or tofu. One oz of lean protein is equal to:  ? 1 oz of meat, chicken, or fish.  ? 1 egg.  ? ¼ cup of tofu.  · Eat some foods each day that contain healthy fats, such as avocado, nuts, seeds, and fish.  Lifestyle  · Check your blood glucose regularly.  · Exercise regularly as told by your health care provider. This may include:  ? 150 minutes of moderate-intensity or vigorous-intensity exercise each week. This could be brisk walking, biking, or water aerobics.  ? Stretching and doing strength exercises, such as yoga or weightlifting, at least 2 times a week.  · Take medicines as told by your health care provider.  · Do not use any products that contain nicotine or tobacco, such as cigarettes and e-cigarettes. If you need help quitting, ask your health care provider.  · Work with a counselor or diabetes educator to identify strategies to manage stress and any emotional and social challenges.  Questions to ask a health care provider  · Do I need to meet with a diabetes educator?  · Do I need to meet with a dietitian?  · What number can I call if I have questions?  · When are the best times to check my blood glucose?  Where to find more  information:  · American Diabetes Association: diabetes.org  · Academy of Nutrition and Dietetics: www.eatright.org  · National Institute of Diabetes and Digestive and Kidney Diseases (NIH): www.niddk.nih.gov  Summary  · A healthy meal plan will help you control your blood glucose and maintain a healthy lifestyle.  · Working with a diet and nutrition specialist (dietitian) can help you make a meal plan that is best for you.  · Keep in mind that carbohydrates (carbs) and alcohol have immediate effects on your blood glucose levels. It is important to count carbs and to use alcohol carefully.  This information is not intended to   replace advice given to you by your health care provider. Make sure you discuss any questions you have with your health care provider.  Document Released: 02/16/2005 Document Revised: 12/20/2016 Document Reviewed: 06/26/2016  Elsevier Interactive Patient Education © 2019 [employer]

## 2018-09-09 NOTE — Progress Notes (Signed)
Subjective:    Patient ID: Matthew May, male    DOB: February 14, 1946, 73 y.o.   MRN: [id number]  Chief Complaint  Patient presents with  . Medical Management of Chronic Issues    diabetes   Pt presents to the office today chronic follow up. Pt is followed by Cardiologistsevery 3-6 months for CAD and A Fib. These are stable at this time.   He reports his dizziness is improved.  States he just started his Januvia two days ago from his last visit.  Hypertension  This is a chronic problem. The current episode started more than 1 year ago. The problem has been resolved since onset. The problem is controlled. Associated symptoms include malaise/fatigue, peripheral edema and shortness of breath ("some times when I work"). Pertinent negatives include no blurred vision or headaches. Risk factors for coronary artery disease include dyslipidemia, diabetes mellitus, obesity, male gender and sedentary lifestyle. The current treatment provides moderate improvement. Hypertensive end-organ damage includes CAD/MI. There is no history of CVA or heart failure. Identifiable causes of hypertension include a thyroid problem.  Diabetes  He presents for his follow-up diabetic visit. He has type 2 diabetes mellitus. His disease course has been stable. Pertinent negatives for hypoglycemia include no headaches or nervousness/anxiousness. Associated symptoms include fatigue. Pertinent negatives for diabetes include no blurred vision, no foot paresthesias and no visual change. There are no hypoglycemic complications. Symptoms are stable. Diabetic complications include heart disease. Pertinent negatives for diabetic complications include no CVA. Risk factors for coronary artery disease include dyslipidemia, diabetes mellitus, male sex, hypertension and sedentary lifestyle. He is following a generally unhealthy diet. His overall blood glucose range is 180-200 mg/dl.  Thyroid Problem  Presents for follow-up visit. Symptoms  include fatigue. Patient reports no anxiety, constipation, depressed mood or visual change. The symptoms have been stable. His past medical history is significant for hyperlipidemia. There is no history of heart failure.  Hyperlipidemia  This is a chronic problem. The current episode started more than 1 year ago. The problem is controlled. Recent lipid tests were reviewed and are normal. Exacerbating diseases include obesity. Associated symptoms include shortness of breath ("some times when I work"). Current antihyperlipidemic treatment includes statins. The current treatment provides moderate improvement of lipids. Risk factors for coronary artery disease include dyslipidemia, diabetes mellitus, male sex, hypertension, post-menopausal and a sedentary lifestyle.  COPD Taking Breo and Albuterol as needed. Discussed importance of taking that Breo daily.  A Fib Taking Eliquis daily. Stable   Review of Systems  Constitutional: Positive for fatigue and malaise/fatigue.  Eyes: Negative for blurred vision.  Respiratory: Positive for shortness of breath ("some times when I work").   Gastrointestinal: Negative for constipation.  Neurological: Negative for headaches.  Psychiatric/Behavioral: The patient is not nervous/anxious.   All other systems reviewed and are negative.      Objective:   Physical Exam Vitals signs reviewed.  Constitutional:      General: He is not in acute distress.    Appearance: He is well-developed.  HENT:     Head: Normocephalic.     Right Ear: Tympanic membrane normal.     Left Ear: Tympanic membrane normal.  Eyes:     General:        Right eye: No discharge.        Left eye: No discharge.     Pupils: Pupils are equal, round, and reactive to light.  Neck:     Musculoskeletal: Normal range of motion and neck supple.  Thyroid: No thyromegaly.  Cardiovascular:     Rate and Rhythm: Normal rate and regular rhythm.     Heart sounds: Murmur present.  Pulmonary:      Effort: Pulmonary effort is normal. No respiratory distress.     Breath sounds: Decreased breath sounds present. No wheezing.  Abdominal:     General: Bowel sounds are normal. There is no distension.     Palpations: Abdomen is soft.     Tenderness: There is no abdominal tenderness.  Musculoskeletal: Normal range of motion.        General: No tenderness.  Skin:    General: Skin is warm and dry.     Findings: No erythema or rash.  Neurological:     Mental Status: He is alert and oriented to person, place, and time.     Cranial Nerves: No cranial nerve deficit.     Deep Tendon Reflexes: Reflexes are normal and symmetric.  Psychiatric:        Behavior: Behavior normal.        Thought Content: Thought content normal.        Judgment: Judgment normal.       BP 135/61   Pulse 68   Temp (!) 97.3 F (36.3 C) (Oral)   Ht '5\' 6"'  (1.676 m)   Wt 239 lb (108.4 kg)   BMI 38.58 kg/m      Assessment & Plan:  Marque Rademaker Brizuela comes in today with chief complaint of Medical Management of Chronic Issues (diabetes)   Diagnosis and orders addressed:  1. Hypertension associated with diabetes ([place]) - CMP14+EGFR  2. Type 2 diabetes mellitus with hyperglycemia, without long-term current use of insulin (HCC) Pt just started Januvia two days ago, will hold off on increasing dose - Bayer DCA Hb A1c Waived - CMP14+EGFR  3. Hyperlipidemia associated with type 2 diabetes mellitus (HCC) - CMP14+EGFR  4. Other specified hypothyroidism - CMP14+EGFR - TSH  5. Paroxysmal atrial fibrillation (HCC) - CMP14+EGFR  6. Morbid obesity (HCC) - CMP14+EGFR  7. Vitamin D deficiency - CMP14+EGFR - VITAMIN D 25 Hydroxy (Vit-D Deficiency, Fractures)  8. Bilateral carotid artery stenosis - CMP14+EGFR  9. Chronic obstructive pulmonary disease, unspecified COPD type ([place])   Labs pending Health Maintenance reviewed Diet and exercise encouraged  Follow up plan: 3 months    Evelina Dun, FNP

## 2018-09-10 ENCOUNTER — Other Ambulatory Visit: Payer: Self-pay | Admitting: Family

## 2018-09-10 LAB — TSH: TSH: 1.61 u[IU]/mL (ref 0.450–4.500)

## 2018-09-10 LAB — CMP14+EGFR
ALT: 42 IU/L (ref 0–44)
AST: 26 IU/L (ref 0–40)
Albumin/Globulin Ratio: 1.8 (ref 1.2–2.2)
Albumin: 4.2 g/dL (ref 3.7–4.7)
Alkaline Phosphatase: 64 IU/L (ref 39–117)
BUN/Creatinine Ratio: 14 (ref 10–24)
BUN: 12 mg/dL (ref 8–27)
Bilirubin Total: 0.4 mg/dL (ref 0.0–1.2)
CO2: 24 mmol/L (ref 20–29)
Calcium: 9.3 mg/dL (ref 8.6–10.2)
Chloride: 99 mmol/L (ref 96–106)
Creatinine, Ser: 0.86 mg/dL (ref 0.76–1.27)
GFR calc Af Amer: 101 mL/min/{1.73_m2} (ref >59)
GFR calc non Af Amer: 87 mL/min/{1.73_m2} (ref >59)
Globulin, Total: 2.3 g/dL (ref 1.5–4.5)
Glucose: 183 mg/dL — ABNORMAL HIGH (ref 65–99)
Potassium: 4.5 mmol/L (ref 3.5–5.2)
Sodium: 137 mmol/L (ref 134–144)
Total Protein: 6.5 g/dL (ref 6.0–8.5)

## 2018-09-10 LAB — VITAMIN D 25 HYDROXY (VIT D DEFICIENCY, FRACTURES): Vit D, 25-Hydroxy: 56.3 ng/mL (ref 30.0–100.0)

## 2018-09-10 MED ORDER — SITAGLIPTIN PHOSPHATE 100 MG PO TABS: 100.0000 mg | ORAL_TABLET | Freq: Every day | ORAL | 1 refills | Status: DC

## 2018-09-12 ENCOUNTER — Telehealth: Payer: Self-pay | Admitting: Family

## 2018-09-12 NOTE — Telephone Encounter (Signed)
He can take the Januvia 50 mg for the next 2 weeks, then increase to 100 mg. The 50 mg tablets he can take 2 of at the same time. Just make sure when he picks up his Januvia 100 mg he only takes 1 tablet.

## 2018-09-12 NOTE — Telephone Encounter (Signed)
OV note says that you were not increasing at this time

## 2018-09-12 NOTE — Telephone Encounter (Signed)
Pt aware and understands

## 2018-10-07 IMAGING — US US EXTREM LOW VENOUS*L*
1 series · 13 of 24 positions shown · non-contrast
Comparison: Ultrasound July 03, 2012.

CLINICAL DATA: Left lower extremity pain and swelling for 2 days.



[Series 1: us extrem low venous*left* · 0.08mm/px · 13 of 36 slices shown]
[im 1/36]
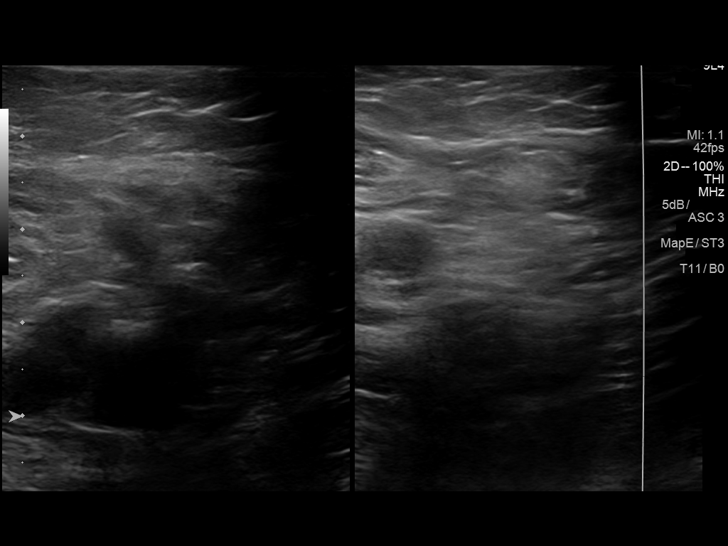
[im 4/36]
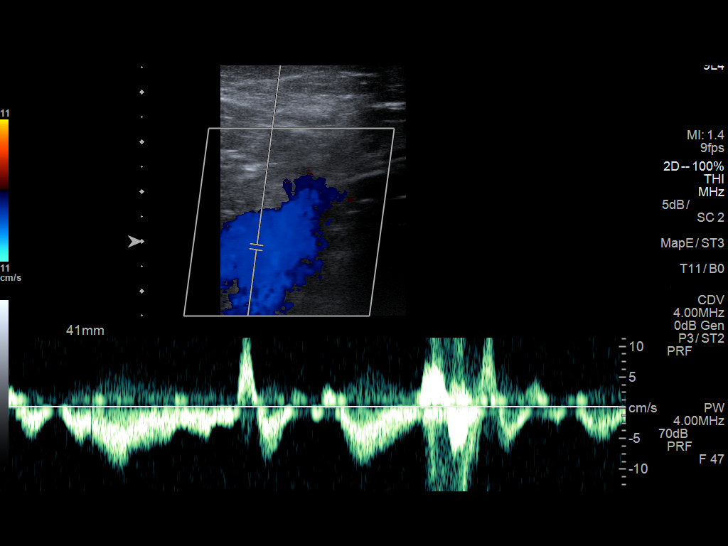
[im 7/36]
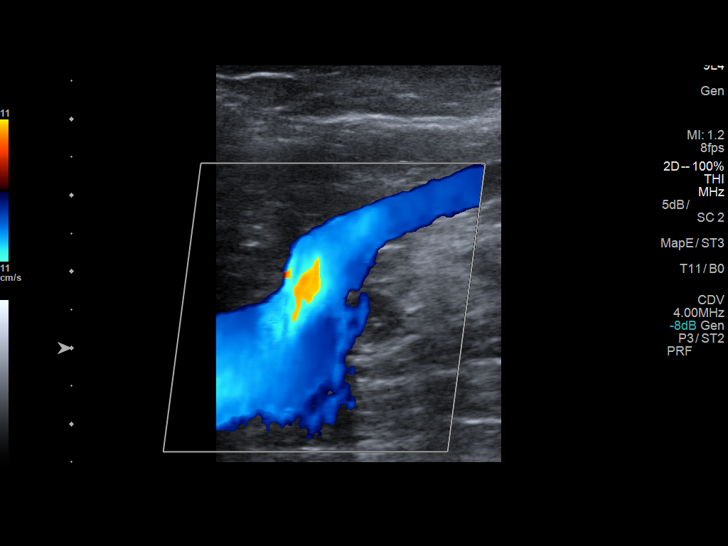
[im 10/36]
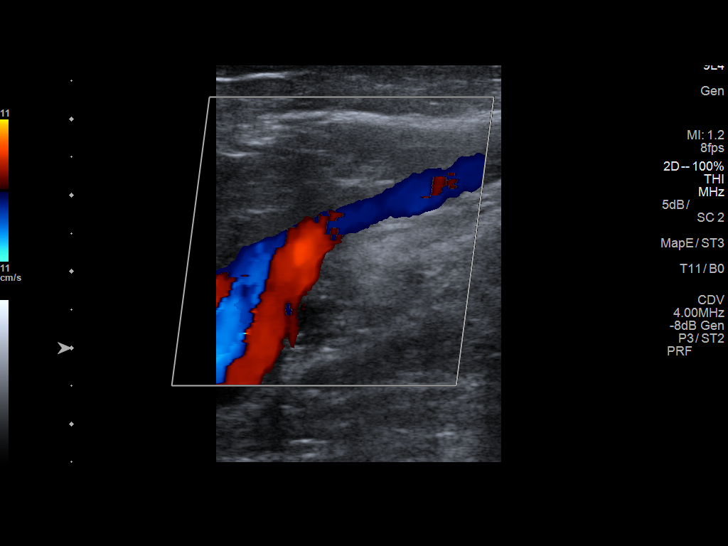
[im 13/36]
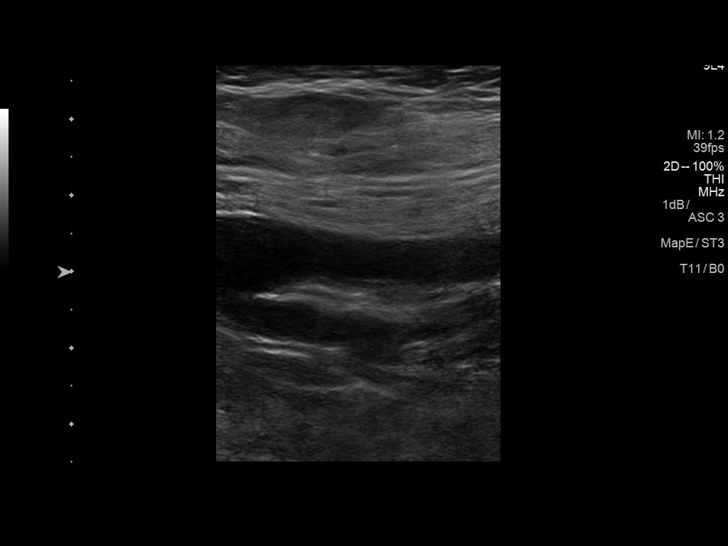
[im 16/36]
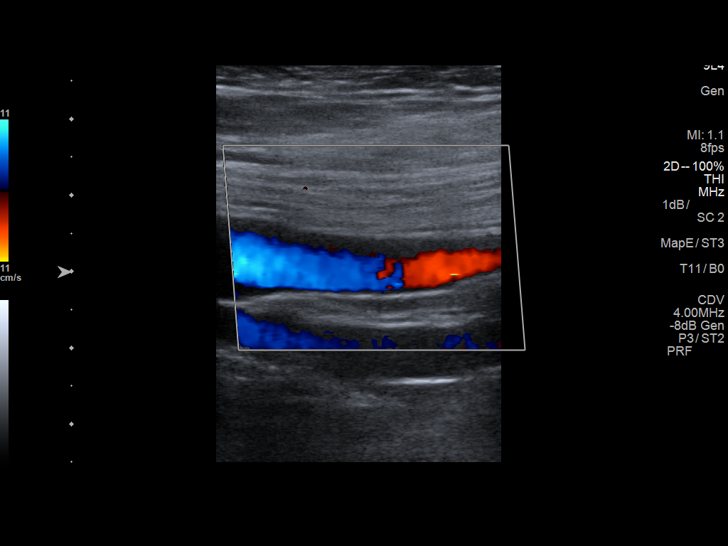
[im 19/36]
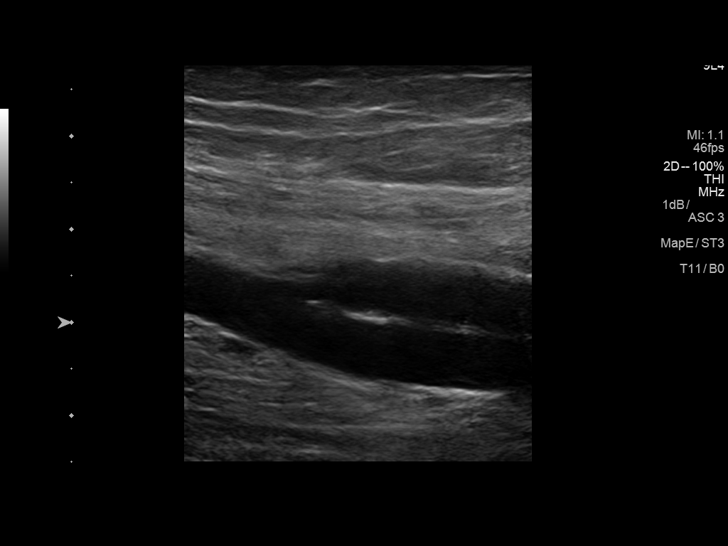
[im 20/36]
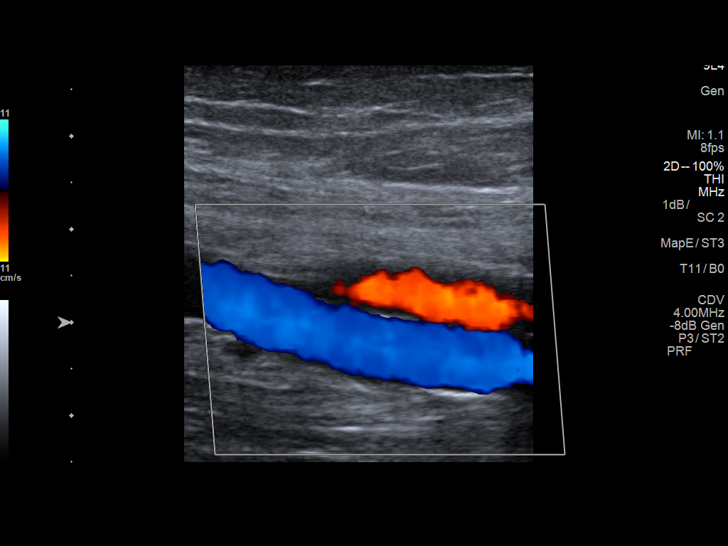
[im 23/36]
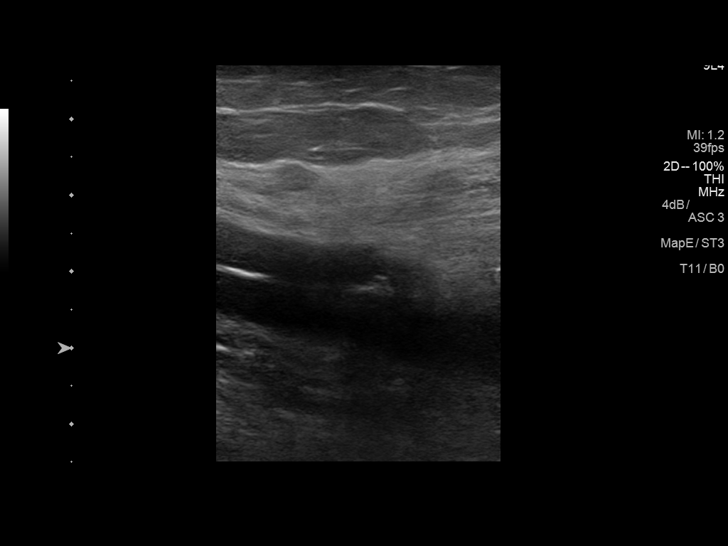
[im 26/36]
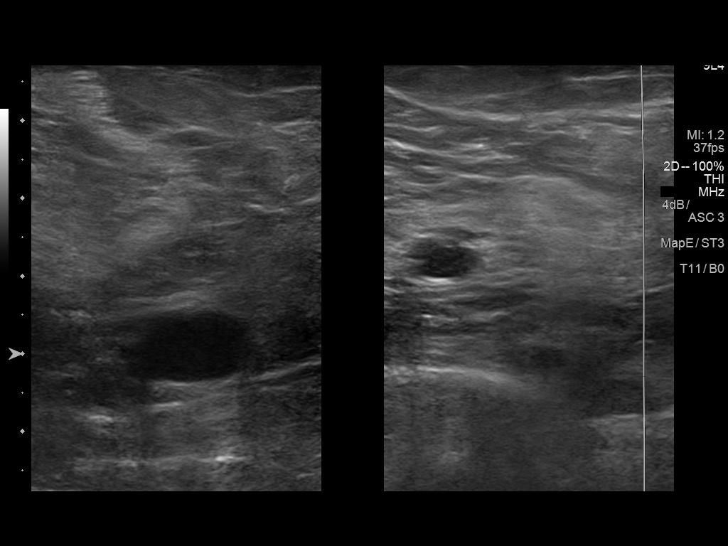
[im 29/36]
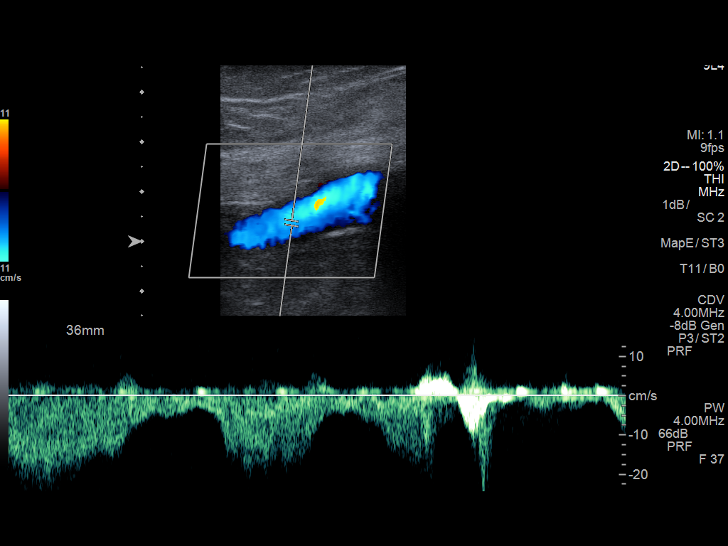
[im 32/36]
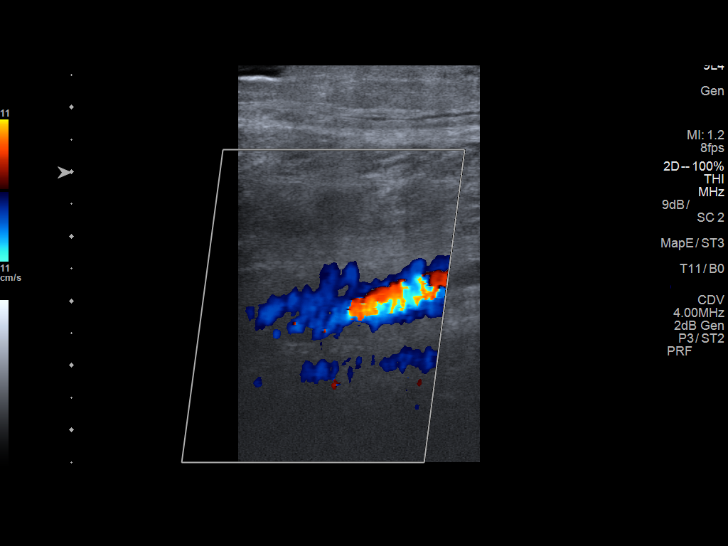
[im 36/36]
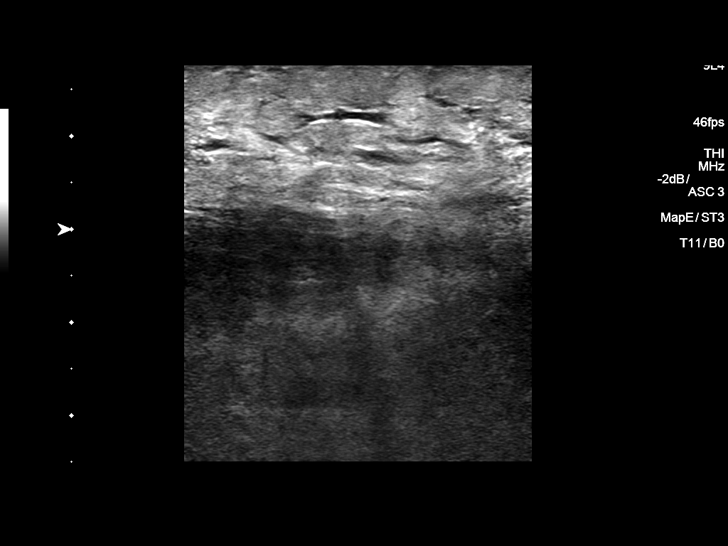

[13 of 24 positions shown; findings below may reference images not displayed]

FINDINGS: Contralateral Common Femoral Vein: Respiratory phasicity is normal
and symmetric with the symptomatic side. No evidence of thrombus.
Normal compressibility.

Common Femoral Vein: No evidence of thrombus. Normal
compressibility, respiratory phasicity and response to augmentation.

Saphenofemoral Junction: No evidence of thrombus. Normal
compressibility and flow on color Doppler imaging.

Profunda Femoral Vein: No evidence of thrombus. Normal
compressibility and flow on color Doppler imaging.

Femoral Vein: No evidence of thrombus. Normal compressibility,
respiratory phasicity and response to augmentation.

Popliteal Vein: No evidence of thrombus. Normal compressibility,
respiratory phasicity and response to augmentation.

Calf Veins: No evidence of thrombus. Normal compressibility and flow
on color Doppler imaging.

Superficial Great Saphenous Vein: No evidence of thrombus. Normal
compressibility and flow on color Doppler imaging.

Venous Reflux:  None.

Other Findings:  None.
IMPRESSION: No evidence of DVT within the left lower extremity.

## 2018-10-11 ENCOUNTER — Other Ambulatory Visit: Payer: Self-pay

## 2018-10-11 NOTE — Patient Outreach (Signed)
[facility]) Care Management  10/11/2018  Vaishnav Demartin Guardia 03/19/1946 [id number]   Medication Adherence call to Mr. Tylerjames Kamel HIPPA Compliant Voice message left with a call back number. Mr. Harren is showing past due on Metformin 1000 under [place].

## 2018-10-15 ENCOUNTER — Other Ambulatory Visit: Payer: Self-pay | Admitting: Family

## 2018-10-17 ENCOUNTER — Other Ambulatory Visit: Payer: Self-pay | Admitting: Family

## 2018-11-05 ENCOUNTER — Other Ambulatory Visit: Payer: Self-pay | Admitting: Family

## 2018-11-06 ENCOUNTER — Other Ambulatory Visit: Payer: Self-pay | Admitting: Family

## 2018-11-13 ENCOUNTER — Other Ambulatory Visit: Payer: Self-pay | Admitting: Family

## 2018-11-25 IMAGING — CR DG CHEST 1V PORT
1 series · 1 of 1 positions shown · non-contrast
Comparison: 11/26/2017

CLINICAL DATA: Shortness of breath

EXAM:
PORTABLE CHEST 1 VIEW

[portable]
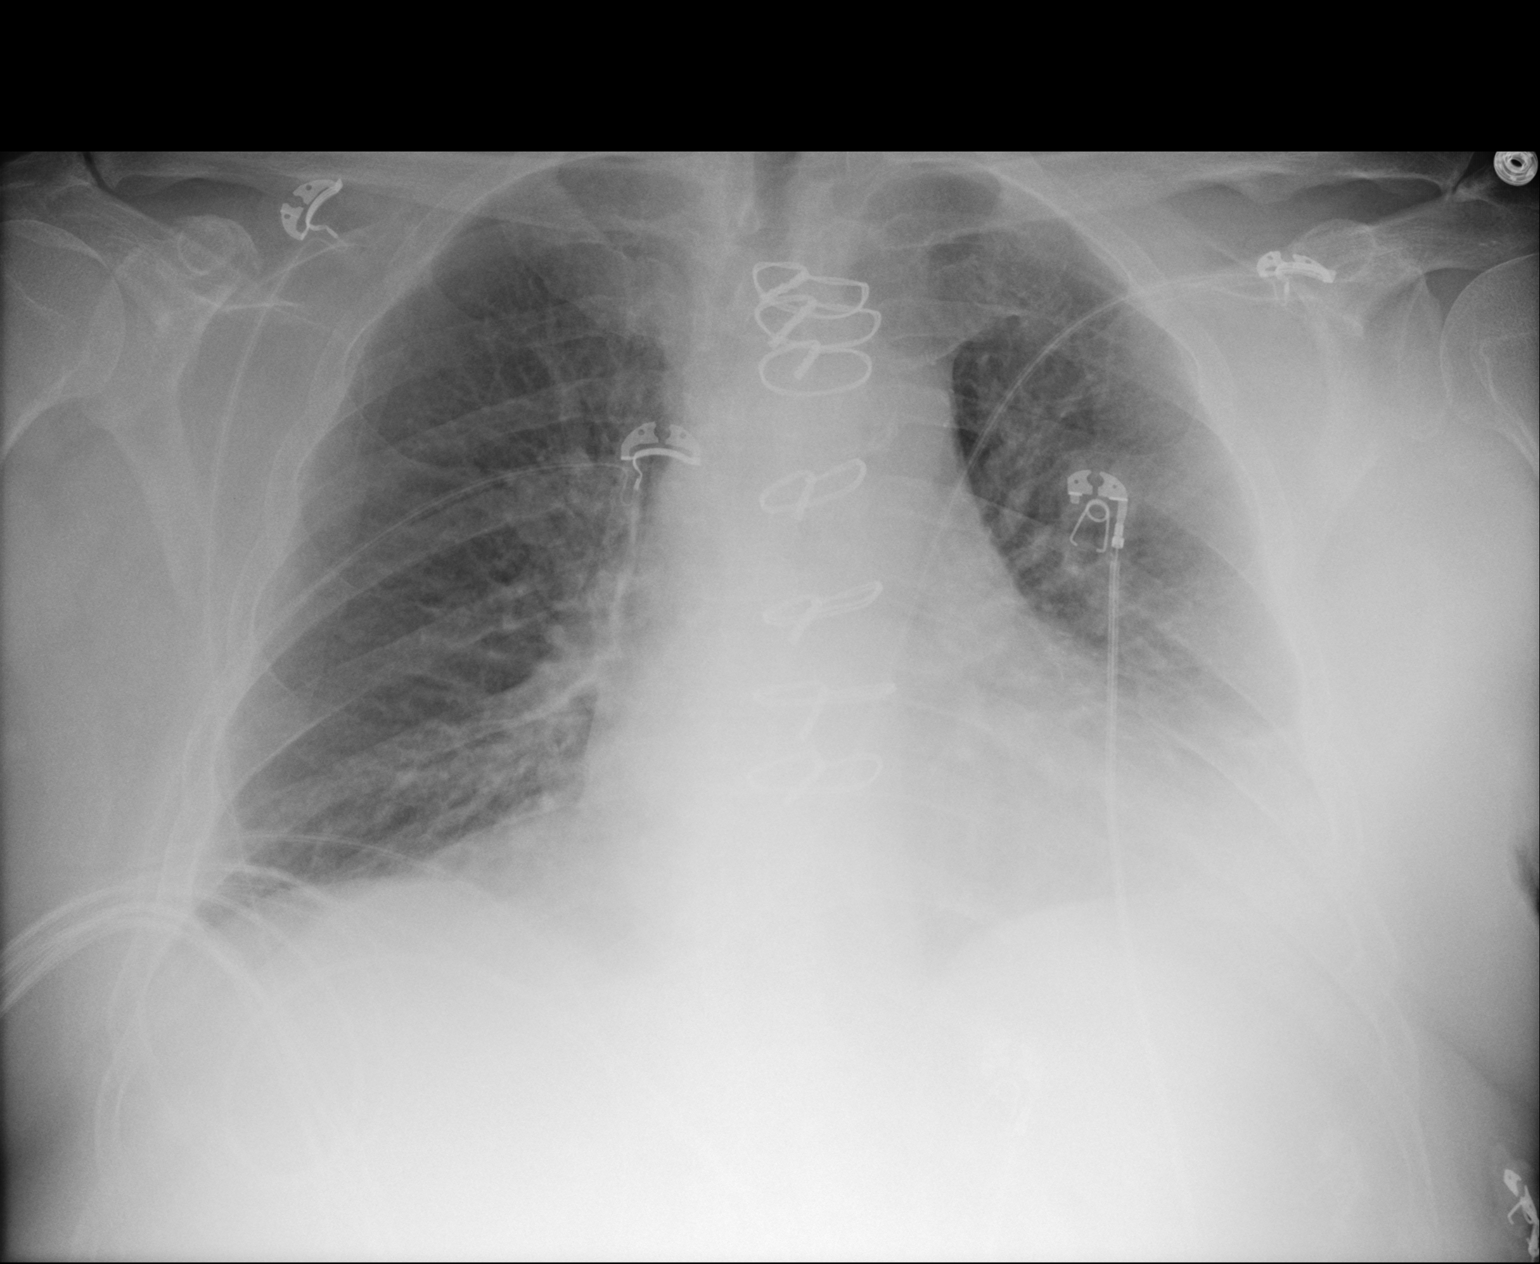

[1 of 1 positions shown; findings below may reference images not displayed]

FINDINGS: Cardiac shadow is stable. Postsurgical changes are again seen. Mild
vascular congestion is noted without interstitial edema. No focal
infiltrate or sizable effusion is noted. No bony abnormality is
seen.
IMPRESSION: Mild vascular congestion without interstitial edema.

## 2018-12-09 ENCOUNTER — Ambulatory Visit (INDEPENDENT_AMBULATORY_CARE_PROVIDER_SITE_OTHER): Payer: Medicare Other | Admitting: Family

## 2018-12-09 ENCOUNTER — Other Ambulatory Visit: Payer: Self-pay

## 2018-12-09 ENCOUNTER — Encounter: Payer: Self-pay | Admitting: Family

## 2018-12-09 DIAGNOSIS — E038 Other specified hypothyroidism: Secondary | ICD-10-CM

## 2018-12-09 DIAGNOSIS — E1169 Type 2 diabetes mellitus with other specified complication: Secondary | ICD-10-CM | POA: Diagnosis not present

## 2018-12-09 DIAGNOSIS — E1159 Type 2 diabetes mellitus with other circulatory complications: Secondary | ICD-10-CM

## 2018-12-09 DIAGNOSIS — J449 Chronic obstructive pulmonary disease, unspecified: Secondary | ICD-10-CM

## 2018-12-09 DIAGNOSIS — I152 Hypertension secondary to endocrine disorders: Secondary | ICD-10-CM

## 2018-12-09 DIAGNOSIS — I350 Nonrheumatic aortic (valve) stenosis: Secondary | ICD-10-CM

## 2018-12-09 DIAGNOSIS — I2510 Atherosclerotic heart disease of native coronary artery without angina pectoris: Secondary | ICD-10-CM

## 2018-12-09 DIAGNOSIS — E785 Hyperlipidemia, unspecified: Secondary | ICD-10-CM

## 2018-12-09 DIAGNOSIS — E1165 Type 2 diabetes mellitus with hyperglycemia: Secondary | ICD-10-CM

## 2018-12-09 DIAGNOSIS — I480 Paroxysmal atrial fibrillation: Secondary | ICD-10-CM

## 2018-12-09 DIAGNOSIS — I10 Essential (primary) hypertension: Secondary | ICD-10-CM

## 2018-12-09 NOTE — Progress Notes (Signed)
Virtual Visit via telephone Note  I connected with Matthew May on 12/09/18 at 9:14 AM by telephone and verified that I am speaking with the correct person using two identifiers. Matthew May is currently located at home and no one is currently with her during visit. The provider, Evelina Dun, FNP is located in their office at time of visit.  I discussed the limitations, risks, security and privacy concerns of performing an evaluation and management service by telephone and the availability of in person appointments. I also discussed with the patient that there may be a patient responsible charge related to this service. The patient expressed understanding and agreed to proceed.   History and Present Illness:  Pt calls the office todaychronic follow up. Pt is followed by Cardiologistsevery 3-6 months for CAD and A Fib. These are stable at this time.   He reports hisdizziness is improved.  States he just started his Januvia two days ago from his last visit.  Diabetes He presents for his follow-up diabetic visit. He has type 2 diabetes mellitus. His disease course has been stable. There are no hypoglycemic associated symptoms. Associated symptoms include fatigue. Pertinent negatives for diabetes include no blurred vision and no foot paresthesias. There are no hypoglycemic complications. Symptoms are stable. Diabetic complications include a CVA and heart disease. Risk factors for coronary artery disease include diabetes mellitus, dyslipidemia, male sex, hypertension and sedentary lifestyle. He is following a generally unhealthy diet. His overall blood glucose range is 180-200 mg/dl.  Hypertension This is a chronic problem. The current episode started more than 1 year ago. The problem has been resolved since onset. The problem is controlled. Associated symptoms include malaise/fatigue, peripheral edema and shortness of breath ("at times when I'm in the heat and working"). Pertinent  negatives include no blurred vision. Risk factors for coronary artery disease include dyslipidemia, diabetes mellitus, obesity, male gender and sedentary lifestyle. The current treatment provides moderate improvement. Hypertensive end-organ damage includes CVA. Identifiable causes of hypertension include a thyroid problem.  Hyperlipidemia This is a chronic problem. The current episode started more than 1 year ago. The problem is controlled. Recent lipid tests were reviewed and are normal. Exacerbating diseases include obesity. Associated symptoms include shortness of breath ("at times when I'm in the heat and working"). Current antihyperlipidemic treatment includes statins. The current treatment provides moderate improvement of lipids. Risk factors for coronary artery disease include dyslipidemia, diabetes mellitus, hypertension, male sex and a sedentary lifestyle.  Thyroid Problem Presents for follow-up visit. Symptoms include constipation and fatigue. Patient reports no depressed mood, diaphoresis or diarrhea. The symptoms have been stable. His past medical history is significant for hyperlipidemia.  COPD PT taking Breo daily. States he has SOB when working out in the heat, but feels better when he sits and rests.     Review of Systems  Constitutional: Positive for fatigue and malaise/fatigue. Negative for diaphoresis.  Eyes: Negative for blurred vision.  Respiratory: Positive for shortness of breath ("at times when I'm in the heat and working").   Gastrointestinal: Positive for constipation. Negative for diarrhea.  All other systems reviewed and are negative.    Observations/Objective: No SOB or distress  Assessment and Plan: Matthew May comes in today with chief complaint of No chief complaint on file.   Diagnosis and orders addressed:  1. Hypertension associated with diabetes ([place]) - CMP14+EGFR; Future - CBC with Differential/Platelet; Future  2. Nonrheumatic aortic valve  stenosis - CMP14+EGFR; Future - CBC with Differential/Platelet; Future  3. Chronic obstructive pulmonary disease, unspecified COPD type ([place]) - CMP14+EGFR; Future - CBC with Differential/Platelet; Future  4. Other specified hypothyroidism - CMP14+EGFR; Future - CBC with Differential/Platelet; Future  5. Hyperlipidemia associated with type 2 diabetes mellitus (HCC) - CMP14+EGFR; Future - CBC with Differential/Platelet; Future  6. Type 2 diabetes mellitus with hyperglycemia, without long-term current use of insulin (HCC) - Bayer DCA Hb A1c Waived; Future - Microalbumin / creatinine urine ratio; Future - CMP14+EGFR; Future - CBC with Differential/Platelet; Future  7. Morbid obesity ([place])  - CMP14+EGFR; Future - CBC with Differential/Platelet; Future  8. Atherosclerosis of native coronary artery of native heart without angina pectoris - CMP14+EGFR; Future - CBC with Differential/Platelet; Future  9. Paroxysmal atrial fibrillation (HCC) - CMP14+EGFR; Future - CBC with Differential/Platelet; Future   Labs pending Health Maintenance reviewed Diet and exercise encouraged  Follow up plan: 3 months     I discussed the assessment and treatment plan with the patient. The patient was provided an opportunity to ask questions and all were answered. The patient agreed with the plan and demonstrated an understanding of the instructions.   The patient was advised to call back or seek an in-person evaluation if the symptoms worsen or if the condition fails to improve as anticipated.  The above assessment and management plan was discussed with the patient. The patient verbalized understanding of and has agreed to the management plan. Patient is aware to call the clinic if symptoms persist or worsen. Patient is aware when to return to the clinic for a follow-up visit. Patient educated on when it is appropriate to go to the emergency department.   Time call ended:  9:29 AM  I provided  15 minutes of non-face-to-face time during this encounter.    Evelina Dun, FNP

## 2018-12-12 ENCOUNTER — Other Ambulatory Visit: Payer: Self-pay

## 2018-12-12 ENCOUNTER — Other Ambulatory Visit: Payer: Medicare Other

## 2018-12-12 DIAGNOSIS — E1165 Type 2 diabetes mellitus with hyperglycemia: Secondary | ICD-10-CM | POA: Diagnosis not present

## 2018-12-12 DIAGNOSIS — I480 Paroxysmal atrial fibrillation: Secondary | ICD-10-CM

## 2018-12-12 DIAGNOSIS — E1159 Type 2 diabetes mellitus with other circulatory complications: Secondary | ICD-10-CM | POA: Diagnosis not present

## 2018-12-12 DIAGNOSIS — I350 Nonrheumatic aortic (valve) stenosis: Secondary | ICD-10-CM

## 2018-12-12 DIAGNOSIS — I10 Essential (primary) hypertension: Secondary | ICD-10-CM | POA: Diagnosis not present

## 2018-12-12 DIAGNOSIS — E038 Other specified hypothyroidism: Secondary | ICD-10-CM

## 2018-12-12 DIAGNOSIS — E1169 Type 2 diabetes mellitus with other specified complication: Secondary | ICD-10-CM

## 2018-12-12 DIAGNOSIS — I2510 Atherosclerotic heart disease of native coronary artery without angina pectoris: Secondary | ICD-10-CM

## 2018-12-12 DIAGNOSIS — J449 Chronic obstructive pulmonary disease, unspecified: Secondary | ICD-10-CM

## 2018-12-12 LAB — BAYER DCA HB A1C WAIVED: HB A1C (BAYER DCA - WAIVED): 7.9 % — ABNORMAL HIGH (ref <7.0)

## 2018-12-13 ENCOUNTER — Other Ambulatory Visit: Payer: Self-pay | Admitting: Family

## 2018-12-13 ENCOUNTER — Telehealth: Payer: Self-pay | Admitting: Family

## 2018-12-13 LAB — CBC WITH DIFFERENTIAL/PLATELET
Basophils Absolute: 0 10*3/uL (ref 0.0–0.2)
Basos: 1 %
EOS (ABSOLUTE): 0.2 10*3/uL (ref 0.0–0.4)
Eos: 2 %
Hematocrit: 42.2 % (ref 37.5–51.0)
Hemoglobin: 13.8 g/dL (ref 13.0–17.7)
Immature Grans (Abs): 0 10*3/uL (ref 0.0–0.1)
Immature Granulocytes: 1 %
Lymphocytes Absolute: 1.5 10*3/uL (ref 0.7–3.1)
Lymphs: 18 %
MCH: 29.2 pg (ref 26.6–33.0)
MCHC: 32.7 g/dL (ref 31.5–35.7)
MCV: 89 fL (ref 79–97)
Monocytes Absolute: 0.4 10*3/uL (ref 0.1–0.9)
Monocytes: 5 %
Neutrophils Absolute: 5.9 10*3/uL (ref 1.4–7.0)
Neutrophils: 73 %
Platelets: 171 10*3/uL (ref 150–450)
RBC: 4.72 x10E6/uL (ref 4.14–5.80)
RDW: 12.6 % (ref 11.6–15.4)
WBC: 8 10*3/uL (ref 3.4–10.8)

## 2018-12-13 LAB — CMP14+EGFR
ALT: 48 IU/L — ABNORMAL HIGH (ref 0–44)
AST: 33 IU/L (ref 0–40)
Albumin/Globulin Ratio: 1.6 (ref 1.2–2.2)
Albumin: 4.4 g/dL (ref 3.7–4.7)
Alkaline Phosphatase: 67 IU/L (ref 39–117)
BUN/Creatinine Ratio: 14 (ref 10–24)
BUN: 13 mg/dL (ref 8–27)
Bilirubin Total: 0.4 mg/dL (ref 0.0–1.2)
CO2: 26 mmol/L (ref 20–29)
Calcium: 9.3 mg/dL (ref 8.6–10.2)
Chloride: 97 mmol/L (ref 96–106)
Creatinine, Ser: 0.93 mg/dL (ref 0.76–1.27)
GFR calc Af Amer: 95 mL/min/{1.73_m2} (ref >59)
GFR calc non Af Amer: 82 mL/min/{1.73_m2} (ref >59)
Globulin, Total: 2.7 g/dL (ref 1.5–4.5)
Glucose: 186 mg/dL — ABNORMAL HIGH (ref 65–99)
Potassium: 4.3 mmol/L (ref 3.5–5.2)
Sodium: 142 mmol/L (ref 134–144)
Total Protein: 7.1 g/dL (ref 6.0–8.5)

## 2018-12-13 LAB — MICROALBUMIN / CREATININE URINE RATIO
Creatinine, Urine: 40.9 mg/dL
Microalb/Creat Ratio: <7 mg/g creat (ref 0–29)
Microalbumin, Urine: <3 ug/mL

## 2018-12-13 NOTE — Telephone Encounter (Signed)
Left message to please call our office. 

## 2018-12-16 ENCOUNTER — Encounter: Payer: Self-pay | Admitting: *Deleted

## 2019-01-06 ENCOUNTER — Other Ambulatory Visit: Payer: Self-pay | Admitting: Family

## 2019-01-08 ENCOUNTER — Other Ambulatory Visit: Payer: Self-pay | Admitting: Family

## 2019-01-08 DIAGNOSIS — E119 Type 2 diabetes mellitus without complications: Secondary | ICD-10-CM

## 2019-02-03 NOTE — Progress Notes (Signed)
HPI The patient returns for followup of his known coronary disease.   He was in the hospital in July 2019 with presyncope.  He had a cath with results as below.  Cath demonstrated moderate AS.   He was sent home and PFTs were obtained.  He did not have any significant findings consistent with lung disease.  He continues to having dizzy spells since his discharge in late July.   He has been found to have atrial fib.  He has been started on Eliquis.    He also had his Torsemide reduced. His dizziness was much improved at the last visit.    Since I last saw him he has done well.  He planted a garden with times of tomatoes.  He does do feeling.  He denies any cardiovascular symptoms.  He denies any new shortness of breath, PND or orthopnea.  He has had no new palpitations, presyncope or syncope.  He has been out of his torsemide for some reason for about a week and so he has some increased lower extremity swelling.  He is not having any new chest pressure, neck or arm discomfort.  Allergies  Allergen Reactions  . Prednisone Other (See Comments)    Pt states "sugar went to 580"  . Jardiance [Empagliflozin] Rash  . Lisinopril Cough    Current Outpatient Medications  Medication Sig Dispense Refill  . albuterol (PROVENTIL HFA;VENTOLIN HFA) 108 (90 Base) MCG/ACT inhaler Inhale 2 puffs into the lungs every 6 (six) hours as needed for wheezing or shortness of breath. 1 Inhaler 0  . apixaban (ELIQUIS) 5 MG TABS tablet Take 1 tablet (5 mg total) by mouth 2 (two) times daily. 180 tablet 3  . atorvastatin (LIPITOR) 40 MG tablet TAKE 1 TABLET BY MOUTH  DAILY 90 tablet 1  . cholecalciferol (VITAMIN D3) 25 MCG (1000 UT) tablet Take 1,000 Units by mouth daily.    . fluticasone furoate-vilanterol (BREO ELLIPTA) 100-25 MCG/INH AEPB Inhale 1 puff into the lungs daily. 1 each 2  . levothyroxine (SYNTHROID) 150 MCG tablet Take 1 tablet (150 mcg total) by mouth daily. 90 tablet 3  . metFORMIN (GLUCOPHAGE) 1000  MG tablet TAKE 1 TABLET BY MOUTH TWO  TIMES DAILY 180 tablet 3  . nitroGLYCERIN (NITROSTAT) 0.4 MG SL tablet DISSOLVE ONE TABLET UNDER THE TONGUE EVERY 5 MINUTES AS NEEDED FOR CHEST PAIN.  DO NOT EXCEED A TOTAL OF 3 DOSES IN 15 MINUTES 25 tablet 0  . potassium chloride SA (K-DUR,KLOR-CON) 20 MEQ tablet TAKE 1 TABLET BY MOUTH  EVERY DAY 90 tablet 2  . sitaGLIPtin (JANUVIA) 100 MG tablet Take 1 tablet (100 mg total) by mouth daily. 90 tablet 1  . valsartan (DIOVAN) 40 MG tablet TAKE ONE-HALF TABLET BY  MOUTH DAILY 45 tablet 2  . glucose blood (ONETOUCH VERIO) test strip TEST EVERY DAY 100 each 0  . Lancets (ONETOUCH DELICA PLUS 123XX123) MISC 1 each by Other route daily. Dx E11.9 100 each 3  . torsemide (DEMADEX) 20 MG tablet Take 2 tablets (40 mg total) by mouth daily. 30 tablet 0   No current facility-administered medications for this visit.     Past Medical History:  Diagnosis Date  . Aortic stenosis   . Atrial fibrillation ([place])   . CAD (coronary artery disease)    a. s/p NSTEMI 4/11 => s/p CABG (L-LAD, S-OM2, S-PDA/PL);  b.  ETT-Myoview 6/14:  normal study, no ischemia, EF 67%  . Carotid stenosis  Carotid U/S 123XX123:  RICA 123456, LICA 123456; right vertebral flow retrograde suggestive of steel-consider PV consult  . COPD (chronic obstructive pulmonary disease) ([place])   . HLD (hyperlipidemia)   . HTN (hypertension)   . Hx of echocardiogram    Echo 6/14: Mod LVH, focal basal hypertrophy, EF 50-55%, mild AS (mean 17 mmHg) and mild AI, mild to mod LAE, mild RAE  . Obesity   . Renal insufficiency   . Subclavian artery stenosis, right ([place])    based upon carotid U/S done 11/2012    Past Surgical History:  Procedure Laterality Date  . CORONARY ARTERY BYPASS GRAFT     2011, LIMA to LAD coronary artery, SVG to OM2 branch of lect circumflex coronary artery, and a sequential SVG to psot descening to posterolateral branches to RCA  . endoscopic vein harvesting     right leg   . HERNIA  REPAIR    . RIGHT/LEFT HEART CATH AND CORONARY/GRAFT ANGIOGRAPHY N/A 12/31/2017   Procedure: RIGHT/LEFT HEART CATH AND CORONARY/GRAFT ANGIOGRAPHY;  Surgeon: Leonie Man, MD;  Location: [facility];  Service: Cardiovascular;  Laterality: N/A;  . TONSILLECTOMY      ROS:  As stated in the HPI and negative for all other systems.  PHYSICAL EXAM BP 120/68   Pulse 68   Ht 5' 7.5" (1.715 m)   Wt 237 lb (107.5 kg)   BMI 36.57 kg/m   GENERAL:  Well appearing NECK:  No jugular venous distention, waveform within normal limits, carotid upstroke brisk and symmetric, no bruits, no thyromegaly LUNGS:  Clear to auscultation bilaterally CHEST:  Unremarkable HEART:  PMI not displaced or sustained,S1 and S2 within normal limits, no S3, no S4, no clicks, no rubs, 3 out of 6 apical systolic murmur radiating slightly at aortic outflow tract, no diastolic murmurs ABD:  Flat, positive bowel sounds normal in frequency in pitch, no bruits, no rebound, no guarding, no midline pulsatile mass, no hepatomegaly, no splenomegaly EXT:  2 plus pulses throughout, moderate bilateral edema, no cyanosis no clubbing  EKG: Sinus rhythm, rate 72, axis within normal limits, intervals within normal limits, premature ventricular contractions, nonspecific inferior T wave flattening.  PVCs, 06/12/2018   Lab Results  Component Value Date   CHOL 123 06/10/2018   TRIG 127 06/10/2018   HDL 29 (L) 06/10/2018   LDLCALC 69 06/10/2018    Cath:  Conclusion    Prox RCA to Dist RCA lesion is 100% stenosed.  Seq SVG- RPDA-RPL graft was visualized by angiography and is large. The graft exhibits no disease.  Prox Cx lesion is 95% stenosed. Mid Cx lesion is 100% stenosed beyond OM2  SVG-Cx graft was visualized by angiography and is normal in caliber. Origin to Prox Graft lesion is 100% stenosed - flush  Mid LAD lesion is 100% stenosed.  LIMA-LAD graft was visualized by angiography and is normal in caliber. The graft  exhibits no disease.  Lat 1st Diag lesion is 90% stenosed.  RHC NUMBERS reviewed: LV end diastolic pressure is normal.- 12 mmHg; LV end diastolic pressure is normal.  There is moderate aortic valve stenosis.   Moderate aortic stenosis by catheterization, however there is significant respiratory variation and beat-to-beat variation of blood pressures making it very difficult to Adequately Assess.  Severe native CAD with occluded mid RCA, subtotal occluded proximal circumflex after small OM branch, totally occluded LAD after D2.   Widely patent LIMA-LAD -Just after D2  Widely patent sequential SVG-RPDA-RPL1  Flush occlusion of  SVG-OM   Right Heart Pressures PA P: 25/11 mmHg-mean 17 mmHg. PCWP: 9 mmHg. LV EDP is normal. LVP -EDP: 195/3 mmHg, 14 mmHg; 173/0 mmHg-14 million mercury  Right Atrium The right atrium is moderately dilated. Right atrial pressure is normal. RAP 4 mmHg  Right Ventricle The right ventricle is mildly dilated. RVP-EDP: 34/0 mmHg - 4 mmHg      ASSESSMENT AND PLAN  ATRIAL FIB:   Mr. Joy Ivey Son has a CHA2DS2 - VASc score of 3.   He tolerates anticoagulation.  He is not had symptomatic paroxysms.  No change in therapy.   CORONARY ATHEROSCLEROSIS NATIVE CORONARY ARTERY - He had an occluded vein graft as described in the anatomy above.   No change in therapy.   HYPERTENSION -  The blood pressure is at target.  No change in therapy.   DYSLIPIDEMIA -   LDL was 69 in Jan.  No change in therapy.   AS - This was moderate on cath.    We are following this clinic.  He does not because of his dizziness.  I will follow-up an echocardiogram after the next visit.   CAROTID STENOSIS - He had less than 40% bilateral stenosis in August of last year.  I will probably repeat this next year.  OBESITY - He fries up his okra and so not sure that he is particularly following a diet and we talked about this.   PVCs - He is not feeling these.  No change in therapy.   DM - His last A1c was slightly down to 7.9 from 8.2.  No change in therapy.

## 2019-02-05 ENCOUNTER — Other Ambulatory Visit: Payer: Self-pay

## 2019-02-05 ENCOUNTER — Ambulatory Visit (INDEPENDENT_AMBULATORY_CARE_PROVIDER_SITE_OTHER): Payer: Medicare Other | Admitting: Cardiology

## 2019-02-05 ENCOUNTER — Encounter: Payer: Self-pay | Admitting: Cardiology

## 2019-02-05 VITALS — BP 120/68 | HR 68 | Ht 67.5 in | Wt 237.0 lb

## 2019-02-05 DIAGNOSIS — E785 Hyperlipidemia, unspecified: Secondary | ICD-10-CM

## 2019-02-05 DIAGNOSIS — R42 Dizziness and giddiness: Secondary | ICD-10-CM

## 2019-02-05 DIAGNOSIS — I10 Essential (primary) hypertension: Secondary | ICD-10-CM

## 2019-02-05 DIAGNOSIS — I350 Nonrheumatic aortic (valve) stenosis: Secondary | ICD-10-CM | POA: Diagnosis not present

## 2019-02-05 DIAGNOSIS — I2510 Atherosclerotic heart disease of native coronary artery without angina pectoris: Secondary | ICD-10-CM

## 2019-02-05 DIAGNOSIS — I480 Paroxysmal atrial fibrillation: Secondary | ICD-10-CM | POA: Diagnosis not present

## 2019-02-05 MED ORDER — TORSEMIDE 20 MG PO TABS: 40.0000 mg | ORAL_TABLET | Freq: Every day | ORAL | 0 refills | Status: DC

## 2019-02-05 NOTE — Patient Instructions (Signed)
Medication Instructions:  The current medical regimen is effective;  continue present plan and medications.  If you need a refill on your cardiac medications before your next appointment, please call your pharmacy.   Follow-Up: Follow up in 1 year with Dr. Hochrein.  You will receive a letter in the mail 2 months before you are due.  Please call us when you receive this letter to schedule your follow up appointment.  Thank you for choosing [facility]!!     

## 2019-02-09 ENCOUNTER — Other Ambulatory Visit: Payer: Self-pay | Admitting: Family

## 2019-03-12 ENCOUNTER — Other Ambulatory Visit: Payer: Self-pay

## 2019-03-12 ENCOUNTER — Other Ambulatory Visit: Payer: Self-pay | Admitting: Cardiology

## 2019-03-13 ENCOUNTER — Ambulatory Visit (INDEPENDENT_AMBULATORY_CARE_PROVIDER_SITE_OTHER): Payer: Medicare Other | Admitting: Family

## 2019-03-13 ENCOUNTER — Encounter: Payer: Self-pay | Admitting: Family

## 2019-03-13 VITALS — BP 113/78 | HR 67 | Temp 97.8°F | Ht 67.5 in | Wt 241.4 lb

## 2019-03-13 DIAGNOSIS — E1159 Type 2 diabetes mellitus with other circulatory complications: Secondary | ICD-10-CM

## 2019-03-13 DIAGNOSIS — E1169 Type 2 diabetes mellitus with other specified complication: Secondary | ICD-10-CM

## 2019-03-13 DIAGNOSIS — I10 Essential (primary) hypertension: Secondary | ICD-10-CM | POA: Diagnosis not present

## 2019-03-13 DIAGNOSIS — I2510 Atherosclerotic heart disease of native coronary artery without angina pectoris: Secondary | ICD-10-CM

## 2019-03-13 DIAGNOSIS — I350 Nonrheumatic aortic (valve) stenosis: Secondary | ICD-10-CM | POA: Diagnosis not present

## 2019-03-13 DIAGNOSIS — E1165 Type 2 diabetes mellitus with hyperglycemia: Secondary | ICD-10-CM

## 2019-03-13 DIAGNOSIS — Z1212 Encounter for screening for malignant neoplasm of rectum: Secondary | ICD-10-CM

## 2019-03-13 DIAGNOSIS — E038 Other specified hypothyroidism: Secondary | ICD-10-CM

## 2019-03-13 DIAGNOSIS — Z1211 Encounter for screening for malignant neoplasm of colon: Secondary | ICD-10-CM

## 2019-03-13 DIAGNOSIS — J449 Chronic obstructive pulmonary disease, unspecified: Secondary | ICD-10-CM | POA: Diagnosis not present

## 2019-03-13 DIAGNOSIS — E785 Hyperlipidemia, unspecified: Secondary | ICD-10-CM

## 2019-03-13 DIAGNOSIS — E559 Vitamin D deficiency, unspecified: Secondary | ICD-10-CM

## 2019-03-13 LAB — BAYER DCA HB A1C WAIVED: HB A1C (BAYER DCA - WAIVED): 7.8 % — ABNORMAL HIGH (ref <7.0)

## 2019-03-13 NOTE — Patient Instructions (Signed)

## 2019-03-13 NOTE — Progress Notes (Signed)
 Subjective:    Patient ID: Matthew May, male    DOB: 08/13/1945, 73 y.o.   MRN: [id number]  Chief Complaint  Patient presents with  . Medical Management of Chronic Issues   Pt calls the office todaychronic follow up. Pt is followed by Cardiologistsevery 3-6 months for CAD and A Fib. These are stable at this time.  Diabetes He presents for his follow-up diabetic visit. He has type 2 diabetes mellitus. His disease course has been stable. There are no hypoglycemic associated symptoms. Associated symptoms include fatigue. Pertinent negatives for diabetes include no blurred vision, no foot paresthesias, no foot ulcerations and no visual change. There are no hypoglycemic complications. Symptoms are stable. Diabetic complications include heart disease. Pertinent negatives for diabetic complications include no nephropathy. Risk factors for coronary artery disease include dyslipidemia, diabetes mellitus, male sex, hypertension and sedentary lifestyle. He is following a generally unhealthy diet. His overall blood glucose range is 140-180 mg/dl. An ACE inhibitor/angiotensin II receptor blocker is being taken.  Hyperlipidemia This is a chronic problem. The current episode started more than 1 year ago. The problem is controlled. Recent lipid tests were reviewed and are normal. Exacerbating diseases include obesity. Associated symptoms include shortness of breath ("some times when I work too much"). Current antihyperlipidemic treatment includes statins. The current treatment provides moderate improvement of lipids. Risk factors for coronary artery disease include dyslipidemia, male sex, hypertension and a sedentary lifestyle.  Hypertension This is a chronic problem. The current episode started more than 1 year ago. The problem has been resolved since onset. The problem is controlled. Associated symptoms include malaise/fatigue, peripheral edema and shortness of breath ("some times when I work too much").  Pertinent negatives include no blurred vision. Risk factors for coronary artery disease include family history, dyslipidemia, diabetes mellitus, male gender and sedentary lifestyle. The current treatment provides moderate improvement. Hypertensive end-organ damage includes CAD/MI. Identifiable causes of hypertension include a thyroid problem.  Thyroid Problem Presents for follow-up visit. Symptoms include fatigue. Patient reports no constipation, depressed mood, diaphoresis, diarrhea or visual change. The symptoms have been stable. His past medical history is significant for hyperlipidemia.  COPD Uses his Breo as needed. Discussed importance of using daily. Quit smoking 2011. Has SOB at times.    Review of Systems  Constitutional: Positive for fatigue and malaise/fatigue. Negative for diaphoresis.  Eyes: Negative for blurred vision.  Respiratory: Positive for shortness of breath ("some times when I work too much").   Gastrointestinal: Negative for constipation and diarrhea.       Objective:   Physical Exam Vitals signs reviewed.  Constitutional:      General: He is not in acute distress.    Appearance: He is well-developed.  HENT:     Head: Normocephalic.     Right Ear: Tympanic membrane normal.     Left Ear: Tympanic membrane normal.  Eyes:     General:        Right eye: No discharge.        Left eye: No discharge.     Pupils: Pupils are equal, round, and reactive to light.  Neck:     Musculoskeletal: Normal range of motion and neck supple.     Thyroid: No thyromegaly.  Cardiovascular:     Rate and Rhythm: Normal rate and regular rhythm.     Heart sounds: Murmur present.  Pulmonary:     Effort: Pulmonary effort is normal. No respiratory distress.     Breath sounds: Normal breath sounds. No   wheezing.  Abdominal:     General: Bowel sounds are normal. There is no distension.     Palpations: Abdomen is soft.     Tenderness: There is no abdominal tenderness.  Musculoskeletal:  Normal range of motion.        General: No tenderness.     Right lower leg: Edema (trace) present.     Left lower leg: Edema (trace) present.  Skin:    General: Skin is warm and dry.     Findings: No erythema or rash.  Neurological:     Mental Status: He is alert and oriented to person, place, and time.     Cranial Nerves: No cranial nerve deficit.     Deep Tendon Reflexes: Reflexes are normal and symmetric.  Psychiatric:        Behavior: Behavior normal.        Thought Content: Thought content normal.        Judgment: Judgment normal.        BP 113/78   Pulse 67   Temp 97.8 F (36.6 C) (Temporal)   Ht 5' 7.5" (1.715 m)   Wt 241 lb 6.4 oz (109.5 kg)   SpO2 99%   BMI 37.25 kg/m   Assessment & Plan:  Matthew May comes in today with chief complaint of Medical Management of Chronic Issues   Diagnosis and orders addressed:  1. Hypertension associated with diabetes ([place]) - CMP14+EGFR - CBC with Differential/Platelet  2. Atherosclerosis of native coronary artery of native heart without angina pectoris - CMP14+EGFR - CBC with Differential/Platelet  3. Nonrheumatic aortic valve stenosis - CMP14+EGFR - CBC with Differential/Platelet  4. Chronic obstructive pulmonary disease, unspecified COPD type ([place]) - CMP14+EGFR - CBC with Differential/Platelet  5. Other specified hypothyroidism - CMP14+EGFR - CBC with Differential/Platelet - TSH  6. Hyperlipidemia associated with type 2 diabetes mellitus (HCC) - CMP14+EGFR - CBC with Differential/Platelet  7. Type 2 diabetes mellitus with hyperglycemia, without long-term current use of insulin (HCC) - Bayer DCA Hb A1c Waived - CMP14+EGFR - CBC with Differential/Platelet  8. Vitamin D deficiency - CMP14+EGFR - CBC with Differential/Platelet  9. Morbid obesity ([place]) - CMP14+EGFR - CBC with Differential/Platelet  10. Colon cancer screening - Cologuard  11. Screening for malignant neoplasm of the rectum -  Cologuard   Labs pending Health Maintenance reviewed Diet and exercise encouraged  Follow up plan: 4 months    Evelina Dun, FNP

## 2019-03-14 LAB — CMP14+EGFR
ALT: 41 IU/L (ref 0–44)
AST: 30 IU/L (ref 0–40)
Albumin/Globulin Ratio: 1.8 (ref 1.2–2.2)
Albumin: 4.4 g/dL (ref 3.7–4.7)
Alkaline Phosphatase: 67 IU/L (ref 39–117)
BUN/Creatinine Ratio: 11 (ref 10–24)
BUN: 11 mg/dL (ref 8–27)
Bilirubin Total: 0.4 mg/dL (ref 0.0–1.2)
CO2: 23 mmol/L (ref 20–29)
Calcium: 9 mg/dL (ref 8.6–10.2)
Chloride: 99 mmol/L (ref 96–106)
Creatinine, Ser: 0.99 mg/dL (ref 0.76–1.27)
GFR calc Af Amer: 88 mL/min/{1.73_m2} (ref >59)
GFR calc non Af Amer: 76 mL/min/{1.73_m2} (ref >59)
Globulin, Total: 2.4 g/dL (ref 1.5–4.5)
Glucose: 168 mg/dL — ABNORMAL HIGH (ref 65–99)
Potassium: 4.2 mmol/L (ref 3.5–5.2)
Sodium: 138 mmol/L (ref 134–144)
Total Protein: 6.8 g/dL (ref 6.0–8.5)

## 2019-03-14 LAB — CBC WITH DIFFERENTIAL/PLATELET
Basophils Absolute: 0.1 10*3/uL (ref 0.0–0.2)
Basos: 1 %
EOS (ABSOLUTE): 0.2 10*3/uL (ref 0.0–0.4)
Eos: 2 %
Hematocrit: 39 % (ref 37.5–51.0)
Hemoglobin: 12.6 g/dL — ABNORMAL LOW (ref 13.0–17.7)
Immature Grans (Abs): 0.1 10*3/uL (ref 0.0–0.1)
Immature Granulocytes: 1 %
Lymphocytes Absolute: 1.5 10*3/uL (ref 0.7–3.1)
Lymphs: 21 %
MCH: 28.4 pg (ref 26.6–33.0)
MCHC: 32.3 g/dL (ref 31.5–35.7)
MCV: 88 fL (ref 79–97)
Monocytes Absolute: 0.4 10*3/uL (ref 0.1–0.9)
Monocytes: 5 %
Neutrophils Absolute: 5 10*3/uL (ref 1.4–7.0)
Neutrophils: 70 %
Platelets: 153 10*3/uL (ref 150–450)
RBC: 4.44 x10E6/uL (ref 4.14–5.80)
RDW: 12.9 % (ref 11.6–15.4)
WBC: 7.1 10*3/uL (ref 3.4–10.8)

## 2019-03-14 LAB — TSH: TSH: 1.89 u[IU]/mL (ref 0.450–4.500)

## 2019-03-16 ENCOUNTER — Other Ambulatory Visit: Payer: Self-pay | Admitting: Family

## 2019-03-21 ENCOUNTER — Other Ambulatory Visit: Payer: Self-pay | Admitting: Family

## 2019-03-21 MED ORDER — APIXABAN 5 MG PO TABS: 5.0000 mg | ORAL_TABLET | Freq: Two times a day (BID) | ORAL | 1 refills | Status: DC

## 2019-03-21 NOTE — Telephone Encounter (Signed)
LMOVM refill sent to pharmacy 

## 2019-03-24 ENCOUNTER — Other Ambulatory Visit: Payer: Self-pay | Admitting: *Deleted

## 2019-04-02 ENCOUNTER — Other Ambulatory Visit: Payer: Self-pay

## 2019-04-02 ENCOUNTER — Ambulatory Visit (INDEPENDENT_AMBULATORY_CARE_PROVIDER_SITE_OTHER): Payer: Medicare Other | Admitting: Family Medicine

## 2019-04-02 ENCOUNTER — Encounter: Payer: Self-pay | Admitting: Family Medicine

## 2019-04-02 VITALS — BP 124/68 | HR 69 | Temp 98.2°F | Ht 67.5 in | Wt 241.8 lb

## 2019-04-02 DIAGNOSIS — L03116 Cellulitis of left lower limb: Secondary | ICD-10-CM

## 2019-04-02 MED ORDER — SULFAMETHOXAZOLE-TRIMETHOPRIM 800-160 MG PO TABS: 1.0000 | ORAL_TABLET | Freq: Two times a day (BID) | ORAL | 0 refills | Status: AC

## 2019-04-02 NOTE — Progress Notes (Signed)
Assessment & Plan:  1. Cellulitis of left lower extremity - Education provided on cellulitis. Encouraged to cleanse daily with normal saline and cover with non-adherent dressing especially if he is going to be outside.  - sulfamethoxazole-trimethoprim (BACTRIM DS) 800-160 MG tablet; Take 1 tablet by mouth 2 (two) times daily for 7 days.  Dispense: 14 tablet; Refill: 0   Follow up plan: Return if symptoms worsen or fail to improve.  Hendricks Limes, MSN, APRN, FNP-C [facility]  Subjective:   Patient ID: Matthew May, male    DOB: Apr 14, 1946, 74 y.o.   MRN: [id number]  HPI: PADRAIC SWEDLUND is a 73 y.o. male presenting on 04/02/2019 for red,swollen wound on lower, left leg  Patient scrapped his left lower leg on a rock two days ago. He has been cleaning it with soap and water. There is mild warmth and erythema. Denies a fever. He does have a history of diabetes.    ROS: Negative unless specifically indicated above in HPI.   Relevant past medical history reviewed and updated as indicated.   Allergies and medications reviewed and updated.   Current Outpatient Medications:  .  albuterol (PROVENTIL HFA;VENTOLIN HFA) 108 (90 Base) MCG/ACT inhaler, Inhale 2 puffs into the lungs every 6 (six) hours as needed for wheezing or shortness of breath., Disp: 1 Inhaler, Rfl: 0 .  apixaban (ELIQUIS) 5 MG TABS tablet, Take 1 tablet (5 mg total) by mouth 2 (two) times daily., Disp: 180 tablet, Rfl: 1 .  atorvastatin (LIPITOR) 40 MG tablet, TAKE 1 TABLET BY MOUTH  DAILY, Disp: 90 tablet, Rfl: 3 .  cholecalciferol (VITAMIN D3) 25 MCG (1000 UT) tablet, Take 1,000 Units by mouth daily., Disp: , Rfl:  .  fluticasone furoate-vilanterol (BREO ELLIPTA) 100-25 MCG/INH AEPB, Inhale 1 puff into the lungs daily., Disp: 1 each, Rfl: 2 .  glucose blood (ONETOUCH VERIO) test strip, TEST EVERY DAY, Disp: 100 each, Rfl: 0 .  Lancets (ONETOUCH DELICA PLUS 123XX123) MISC, 1 each by Other  route daily. Dx E11.9, Disp: 100 each, Rfl: 3 .  levothyroxine (SYNTHROID) 150 MCG tablet, Take 1 tablet (150 mcg total) by mouth daily., Disp: 90 tablet, Rfl: 3 .  metFORMIN (GLUCOPHAGE) 1000 MG tablet, TAKE 1 TABLET BY MOUTH TWO  TIMES DAILY, Disp: 180 tablet, Rfl: 3 .  nitroGLYCERIN (NITROSTAT) 0.4 MG SL tablet, DISSOLVE ONE TABLET UNDER THE TONGUE EVERY 5 MINUTES AS NEEDED FOR CHEST PAIN.  May NOT EXCEED A TOTAL OF 3 DOSES IN 15 MINUTES, Disp: 25 tablet, Rfl: 0 .  potassium chloride SA (KLOR-CON) 20 MEQ tablet, TAKE 1 TABLET BY MOUTH  DAILY, Disp: 90 tablet, Rfl: 3 .  sitaGLIPtin (JANUVIA) 100 MG tablet, Take 1 tablet (100 mg total) by mouth daily., Disp: 90 tablet, Rfl: 1 .  torsemide (DEMADEX) 20 MG tablet, TAKE 2 TABLETS BY MOUTH  DAILY, Disp: 180 tablet, Rfl: 3 .  valsartan (DIOVAN) 40 MG tablet, TAKE ONE-HALF TABLET BY  MOUTH DAILY, Disp: 45 tablet, Rfl: 2 .  sulfamethoxazole-trimethoprim (BACTRIM DS) 800-160 MG tablet, Take 1 tablet by mouth 2 (two) times daily for 7 days., Disp: 14 tablet, Rfl: 0  Allergies  Allergen Reactions  . Prednisone Other (See Comments)    Pt states "sugar went to 580"  . Jardiance [Empagliflozin] Rash  . Lisinopril Cough    Objective:   BP 124/68   Pulse 69   Temp 98.2 F (36.8 C) (Temporal)   Ht 5' 7.5" (1.715 m)  Wt 241 lb 12.8 oz (109.7 kg)   SpO2 97%   BMI 37.31 kg/m    Physical Exam Vitals signs reviewed.  Constitutional:      General: He is not in acute distress.    Appearance: Normal appearance. He is not ill-appearing, toxic-appearing or diaphoretic.  HENT:     Head: Normocephalic and atraumatic.  Eyes:     General: No scleral icterus.       Right eye: No discharge.        Left eye: No discharge.     Conjunctiva/sclera: Conjunctivae normal.  Neck:     Musculoskeletal: Normal range of motion.  Cardiovascular:     Rate and Rhythm: Normal rate.  Pulmonary:     Effort: Pulmonary effort is normal. No respiratory distress.   Musculoskeletal: Normal range of motion.  Skin:    General: Skin is warm and dry.     Findings: Wound (LLE with surrounding erythema, mild warmth, and scant serous drainage. Picture below.) present.  Neurological:     Mental Status: He is alert and oriented to person, place, and time. Mental status is at baseline.  Psychiatric:        Mood and Affect: Mood normal.        Behavior: Behavior normal.        Thought Content: Thought content normal.        Judgment: Judgment normal.

## 2019-04-02 NOTE — Patient Instructions (Signed)

## 2019-04-04 ENCOUNTER — Encounter: Payer: Self-pay | Admitting: Family Medicine

## 2019-04-17 ENCOUNTER — Other Ambulatory Visit: Payer: Self-pay

## 2019-04-18 ENCOUNTER — Encounter: Payer: Self-pay | Admitting: Family Medicine

## 2019-04-18 ENCOUNTER — Ambulatory Visit (INDEPENDENT_AMBULATORY_CARE_PROVIDER_SITE_OTHER): Payer: Medicare Other | Admitting: Family Medicine

## 2019-04-18 ENCOUNTER — Other Ambulatory Visit: Payer: Self-pay

## 2019-04-18 VITALS — BP 110/70 | HR 90 | Temp 98.0°F | Resp 20 | Ht 67.5 in | Wt 240.0 lb

## 2019-04-18 DIAGNOSIS — M109 Gout, unspecified: Secondary | ICD-10-CM | POA: Diagnosis not present

## 2019-04-18 DIAGNOSIS — M10071 Idiopathic gout, right ankle and foot: Secondary | ICD-10-CM | POA: Diagnosis not present

## 2019-04-18 MED ORDER — COLCHICINE 0.6 MG PO TABS: 0.6000 mg | ORAL_TABLET | Freq: Every day | ORAL | 0 refills | Status: DC

## 2019-04-18 NOTE — Patient Instructions (Signed)
Gout  Gout is painful swelling of your joints. Gout is a type of arthritis. It is caused by having too much uric acid in your body. Uric acid is a chemical that is made when your body breaks down substances called purines. If your body has too much uric acid, sharp crystals can form and build up in your joints. This causes pain and swelling. Gout attacks can happen quickly and be very painful (acute gout). Over time, the attacks can affect more joints and happen more often (chronic gout). What are the causes?  Too much uric acid in your blood. This can happen because: ? Your kidneys do not remove enough uric acid from your blood. ? Your body makes too much uric acid. ? You eat too many foods that are high in purines. These foods include organ meats, some seafood, and beer.  Trauma or stress. What increases the risk?  Having a family history of gout.  Being male and middle-aged.  Being male and having gone through menopause.  Being very overweight (obese).  Drinking alcohol, especially beer.  Not having enough water in the body (being dehydrated).  Losing weight too quickly.  Having an organ transplant.  Having lead poisoning.  Taking certain medicines.  Having kidney disease.  Having a skin condition called psoriasis. What are the signs or symptoms? An attack of acute gout usually happens in just one joint. The most common place is the big toe. Attacks often start at night. Other joints that may be affected include joints of the feet, ankle, knee, fingers, wrist, or elbow. Symptoms of an attack may include:  Very bad pain.  Warmth.  Swelling.  Stiffness.  Shiny, red, or purple skin.  Tenderness. The affected joint may be very painful to touch.  Chills and fever. Chronic gout may cause symptoms more often. More joints may be involved. You may also have white or yellow lumps (tophi) on your hands or feet or in other areas near your joints. How is this  treated?  Treatment for this condition has two phases: treating an acute attack and preventing future attacks.  Acute gout treatment may include: ? NSAIDs. ? Steroids. These are taken by mouth or injected into a joint. ? Colchicine. This medicine relieves pain and swelling. It can be given by mouth or through an IV tube.  Preventive treatment may include: ? Taking small doses of NSAIDs or colchicine daily. ? Using a medicine that reduces uric acid levels in your blood. ? Making changes to your diet. You may need to see a food expert (dietitian) about what to eat and drink to prevent gout. Follow these instructions at home: During a gout attack   If told, put ice on the painful area: ? Put ice in a plastic bag. ? Place a towel between your skin and the bag. ? Leave the ice on for 20 minutes, 2-3 times a day.  Raise (elevate) the painful joint above the level of your heart as often as you can.  Rest the joint as much as possible. If the joint is in your leg, you may be given crutches.  Follow instructions from your doctor about what you cannot eat or drink. Avoiding future gout attacks  Eat a low-purine diet. Avoid foods and drinks such as: ? Liver. ? Kidney. ? Anchovies. ? Asparagus. ? Herring. ? Mushrooms. ? Mussels. ? Beer.  Stay at a healthy weight. If you want to lose weight, talk with your doctor. Do not lose weight   too fast.  Start or continue an exercise plan as told by your doctor. Eating and drinking  Drink enough fluids to keep your pee (urine) pale yellow.  If you drink alcohol: ? Limit how much you use to:  0-1 drink a day for women.  0-2 drinks a day for men. ? Be aware of how much alcohol is in your drink. In the [place] one drink equals one 12 oz bottle of beer (355 mL), one 5 oz glass of wine (148 mL), or one 1 oz glass of hard liquor (44 mL). General instructions  Take over-the-counter and prescription medicines only as told by your doctor.  Do  not drive or use heavy machinery while taking prescription pain medicine.  Return to your normal activities as told by your doctor. Ask your doctor what activities are safe for you.  Keep all follow-up visits as told by your doctor. This is important. Contact a doctor if:  You have another gout attack.  You still have symptoms of a gout attack after 10 days of treatment.  You have problems (side effects) because of your medicines.  You have chills or a fever.  You have burning pain when you pee (urinate).  You have pain in your lower back or belly. Get help right away if:  You have very bad pain.  Your pain cannot be controlled.  You cannot pee. Summary  Gout is painful swelling of the joints.  The most common site of pain is the big toe, but it can affect other joints.  Medicines and avoiding some foods can help to prevent and treat gout attacks. This information is not intended to replace advice given to you by your health care provider. Make sure you discuss any questions you have with your health care provider. Document Released: 02/29/2008 Document Revised: 12/12/2017 Document Reviewed: 12/12/2017 Elsevier Patient Education  2020 Elsevier Inc.  

## 2019-04-19 NOTE — Progress Notes (Signed)
Subjective:  Patient ID: Matthew May, male    DOB: Sep 04, 1945, 73 y.o.   MRN: [id number]  Patient Care Team: Sharion Balloon, FNP as PCP - General (Nurse Practitioner) Minus Breeding, MD as PCP - Cardiology (Cardiology) Minus Breeding, MD as Consulting Physician (Cardiology)   Chief Complaint:  right foot redness (pain )   HPI: Matthew May is a 73 y.o. male presenting on 04/18/2019 for right foot redness (pain )   Pt presents today with complaints of right great toe pain, redness, and swelling. Pt reports this started 3 days ago and is getting worse. States the redness and swelling is now going into his foot. He denies injury, wound, or insect bite. Reports prior history of gout several years back. Denies known contributing foods or drinks.   Toe Pain  The incident occurred 3 to 5 days ago. The incident occurred at home. There was no injury mechanism. The pain is present in the right foot and right toes. The quality of the pain is described as burning and shooting. The pain is at a severity of 6/10. The pain is moderate. The pain has been constant since onset. Pertinent negatives include no inability to bear weight, loss of motion, loss of sensation, muscle weakness, numbness or tingling. He reports no foreign bodies present. The symptoms are aggravated by movement, palpation and weight bearing. He has tried acetaminophen for the symptoms. The treatment provided no relief.    Relevant past medical, surgical, family, and social history reviewed and updated as indicated.  Allergies and medications reviewed and updated. Date reviewed: Chart in Epic.   Past Medical History:  Diagnosis Date  . Aortic stenosis   . Atrial fibrillation ([place])   . CAD (coronary artery disease)    a. s/p NSTEMI 4/11 => s/p CABG (L-LAD, S-OM2, S-PDA/PL);  b.  ETT-Myoview 6/14:  normal study, no ischemia, EF 67%  . Carotid stenosis    Carotid U/S 3/29:  RICA 5-18%, LICA 84-16%; right vertebral  flow retrograde suggestive of steel-consider PV consult  . COPD (chronic obstructive pulmonary disease) ([place])   . HLD (hyperlipidemia)   . HTN (hypertension)   . Hx of echocardiogram    Echo 6/14: Mod LVH, focal basal hypertrophy, EF 50-55%, mild AS (mean 17 mmHg) and mild AI, mild to mod LAE, mild RAE  . Obesity   . Renal insufficiency   . Subclavian artery stenosis, right ([place])    based upon carotid U/S done 11/2012    Past Surgical History:  Procedure Laterality Date  . CORONARY ARTERY BYPASS GRAFT     2011, LIMA to LAD coronary artery, SVG to OM2 branch of lect circumflex coronary artery, and a sequential SVG to psot descening to posterolateral branches to RCA  . endoscopic vein harvesting     right leg   . HERNIA REPAIR    . RIGHT/LEFT HEART CATH AND CORONARY/GRAFT ANGIOGRAPHY N/A 12/31/2017   Procedure: RIGHT/LEFT HEART CATH AND CORONARY/GRAFT ANGIOGRAPHY;  Surgeon: Leonie Man, MD;  Location: [facility];  Service: Cardiovascular;  Laterality: N/A;  . TONSILLECTOMY      Social History   Socioeconomic History  . Marital status: Married    Spouse name: Not on file  . Number of children: 3  . Years of education: Not on file  . Highest education level: Not on file  Occupational History  . Occupation: Retired  Scientific laboratory technician  . Financial resource strain: Not very hard  . Food  insecurity    Worry: Never true    Inability: Never true  . Transportation needs    Medical: No    Non-medical: No  Tobacco Use  . Smoking status: Former Smoker    Quit date: 07/11/2009    Years since quitting: 9.7  . Smokeless tobacco: Never Used  . Tobacco comment: smoked about 10 cig/day; used to smoke 2 ppd for many years   Substance and Sexual Activity  . Alcohol use: No    Alcohol/week: 0.0 standard drinks  . Drug use: No  . Sexual activity: Never  Lifestyle  . Physical activity    Days per week: 0 days    Minutes per session: Not on file  . Stress: Only a little   Relationships  . Social connections    Talks on phone: More than three times a week    Gets together: More than three times a week    Attends religious service: Never    Active member of club or organization: No    Attends meetings of clubs or organizations: Never    Relationship status: Married  . Intimate partner violence    Fear of current or ex partner: No    Emotionally abused: No    Physically abused: No    Forced sexual activity: No  Other Topics Concern  . Not on file  Social History Narrative   Lives at home with his wife. He is retired but his wife continues to work daily. They have adult children that live locally and grandchildren that they spend time with regularly. He is active around his home and yard.     Outpatient Encounter Medications as of 04/18/2019  Medication Sig  . albuterol (PROVENTIL HFA;VENTOLIN HFA) 108 (90 Base) MCG/ACT inhaler Inhale 2 puffs into the lungs every 6 (six) hours as needed for wheezing or shortness of breath.  Marland Kitchen apixaban (ELIQUIS) 5 MG TABS tablet Take 1 tablet (5 mg total) by mouth 2 (two) times daily.  Marland Kitchen atorvastatin (LIPITOR) 40 MG tablet TAKE 1 TABLET BY MOUTH  DAILY  . cholecalciferol (VITAMIN D3) 25 MCG (1000 UT) tablet Take 1,000 Units by mouth daily.  . fluticasone furoate-vilanterol (BREO ELLIPTA) 100-25 MCG/INH AEPB Inhale 1 puff into the lungs daily.  Marland Kitchen glucose blood (ONETOUCH VERIO) test strip TEST EVERY DAY  . Lancets (ONETOUCH DELICA PLUS [id number]) MISC 1 each by Other route daily. Dx E11.9  . levothyroxine (SYNTHROID) 150 MCG tablet Take 1 tablet (150 mcg total) by mouth daily.  . metFORMIN (GLUCOPHAGE) 1000 MG tablet TAKE 1 TABLET BY MOUTH TWO  TIMES DAILY  . nitroGLYCERIN (NITROSTAT) 0.4 MG SL tablet DISSOLVE ONE TABLET UNDER THE TONGUE EVERY 5 MINUTES AS NEEDED FOR CHEST PAIN.  DO NOT EXCEED A TOTAL OF 3 DOSES IN 15 MINUTES  . potassium chloride SA (KLOR-CON) 20 MEQ tablet TAKE 1 TABLET BY MOUTH  DAILY  . sitaGLIPtin  (JANUVIA) 100 MG tablet Take 1 tablet (100 mg total) by mouth daily.  Marland Kitchen torsemide (DEMADEX) 20 MG tablet TAKE 2 TABLETS BY MOUTH  DAILY  . valsartan (DIOVAN) 40 MG tablet TAKE ONE-HALF TABLET BY  MOUTH DAILY  . colchicine 0.6 MG tablet Take 1 tablet (0.6 mg total) by mouth daily. 2 tablets now and then 1 tablet in one hour. Then one tablet daily until symptoms resolve.   No facility-administered encounter medications on file as of 04/18/2019.     Allergies  Allergen Reactions  . Prednisone Other (See Comments)  Pt states "sugar went to 580"  . Jardiance [Empagliflozin] Rash  . Lisinopril Cough    Review of Systems  Constitutional: Negative for activity change, appetite change, chills, diaphoresis, fatigue, fever and unexpected weight change.  HENT: Negative.   Eyes: Negative.   Respiratory: Negative for cough, chest tightness and shortness of breath.   Cardiovascular: Negative for chest pain, palpitations and leg swelling.  Gastrointestinal: Negative for blood in stool, constipation, diarrhea, nausea and vomiting.  Endocrine: Negative.   Genitourinary: Negative for decreased urine volume, difficulty urinating, dysuria, frequency and urgency.  Musculoskeletal: Positive for arthralgias and joint swelling. Negative for myalgias.  Skin: Positive for color change.  Allergic/Immunologic: Negative.   Neurological: Negative for dizziness, tingling, numbness and headaches.  Hematological: Negative.   Psychiatric/Behavioral: Negative for confusion, hallucinations, sleep disturbance and suicidal ideas.  All other systems reviewed and are negative.       Objective:  BP 110/70   Pulse 90   Temp 98 F (36.7 C)   Resp 20   Ht 5' 7.5" (1.715 m)   Wt 240 lb (108.9 kg)   SpO2 97%   BMI 37.03 kg/m    Wt Readings from Last 3 Encounters:  04/18/19 240 lb (108.9 kg)  04/02/19 241 lb 12.8 oz (109.7 kg)  03/13/19 241 lb 6.4 oz (109.5 kg)    Physical Exam Vitals signs and nursing note  reviewed.  Constitutional:      General: He is not in acute distress.    Appearance: Normal appearance. He is well-developed and well-groomed. He is not ill-appearing, toxic-appearing or diaphoretic.  HENT:     Head: Normocephalic and atraumatic.     Jaw: There is normal jaw occlusion.     Right Ear: Hearing normal.     Left Ear: Hearing normal.     Nose: Nose normal.     Mouth/Throat:     Lips: Pink.     Mouth: Mucous membranes are moist.     Pharynx: Oropharynx is clear. Uvula midline.  Eyes:     General: Lids are normal.     Extraocular Movements: Extraocular movements intact.     Conjunctiva/sclera: Conjunctivae normal.     Pupils: Pupils are equal, round, and reactive to light.  Neck:     Musculoskeletal: Normal range of motion and neck supple.     Thyroid: No thyroid mass, thyromegaly or thyroid tenderness.     Vascular: No carotid bruit or JVD.     Trachea: Trachea and phonation normal.  Cardiovascular:     Rate and Rhythm: Normal rate. Rhythm regularly irregular.     Chest Wall: PMI is not displaced.     Pulses: Normal pulses.          Dorsalis pedis pulses are 2+ on the right side.       Posterior tibial pulses are 2+ on the right side.     Heart sounds: Murmur present. Systolic murmur present with a grade of 3/6. No friction rub. No gallop.   Pulmonary:     Effort: Pulmonary effort is normal. No respiratory distress.     Breath sounds: Normal breath sounds. No wheezing.  Abdominal:     General: Bowel sounds are normal. There is no distension or abdominal bruit.     Palpations: Abdomen is soft. There is no hepatomegaly or splenomegaly.     Tenderness: There is no abdominal tenderness. There is no right CVA tenderness or left CVA tenderness.     Hernia: No hernia is  present.  Musculoskeletal: Normal range of motion.     Right lower leg: 1+ Edema present.     Left lower leg: 1+ Edema present.       Feet:  Feet:     Right foot:     Skin integrity: Erythema and  warmth present. No ulcer, blister, skin breakdown, callus, dry skin or fissure.     Left foot:     Skin integrity: Skin integrity normal.     Comments: Swelling, redness, and tenderness in circled area above.  Lymphadenopathy:     Cervical: No cervical adenopathy.  Skin:    General: Skin is warm and dry.     Capillary Refill: Capillary refill takes less than 2 seconds.     Coloration: Skin is not cyanotic, jaundiced or pale.     Findings: No rash.  Neurological:     General: No focal deficit present.     Mental Status: He is alert and oriented to person, place, and time.     Cranial Nerves: Cranial nerves are intact.     Sensory: Sensation is intact.     Motor: Motor function is intact.     Coordination: Coordination is intact.     Gait: Gait is intact.     Deep Tendon Reflexes: Reflexes are normal and symmetric.  Psychiatric:        Attention and Perception: Attention and perception normal.        Mood and Affect: Mood and affect normal.        Speech: Speech normal.        Behavior: Behavior normal. Behavior is cooperative.        Thought Content: Thought content normal.        Cognition and Memory: Cognition and memory normal.        Judgment: Judgment normal.     Results for orders placed or performed in visit on 03/13/19  Bayer DCA Hb A1c Waived  Result Value Ref Range   HB A1C (BAYER DCA - WAIVED) 7.8 (H) <7.0 %  CMP14+EGFR  Result Value Ref Range   Glucose 168 (H) 65 - 99 mg/dL   BUN 11 8 - 27 mg/dL   Creatinine, Ser 0.99 0.76 - 1.27 mg/dL   GFR calc non Af Amer 76 >59 mL/min/1.73   GFR calc Af Amer 88 >59 mL/min/1.73   BUN/Creatinine Ratio 11 10 - 24   Sodium 138 134 - 144 mmol/L   Potassium 4.2 3.5 - 5.2 mmol/L   Chloride 99 96 - 106 mmol/L   CO2 23 20 - 29 mmol/L   Calcium 9.0 8.6 - 10.2 mg/dL   Total Protein 6.8 6.0 - 8.5 g/dL   Albumin 4.4 3.7 - 4.7 g/dL   Globulin, Total 2.4 1.5 - 4.5 g/dL   Albumin/Globulin Ratio 1.8 1.2 - 2.2   Bilirubin Total 0.4  0.0 - 1.2 mg/dL   Alkaline Phosphatase 67 39 - 117 IU/L   AST 30 0 - 40 IU/L   ALT 41 0 - 44 IU/L  CBC with Differential/Platelet  Result Value Ref Range   WBC 7.1 3.4 - 10.8 x10E3/uL   RBC 4.44 4.14 - 5.80 x10E6/uL   Hemoglobin 12.6 (L) 13.0 - 17.7 g/dL   Hematocrit 39.0 37.5 - 51.0 %   MCV 88 79 - 97 fL   MCH 28.4 26.6 - 33.0 pg   MCHC 32.3 31.5 - 35.7 g/dL   RDW 12.9 11.6 - 15.4 %   Platelets 153 150 -  450 x10E3/uL   Neutrophils 70 Not Estab. %   Lymphs 21 Not Estab. %   Monocytes 5 Not Estab. %   Eos 2 Not Estab. %   Basos 1 Not Estab. %   Neutrophils Absolute 5.0 1.4 - 7.0 x10E3/uL   Lymphocytes Absolute 1.5 0.7 - 3.1 x10E3/uL   Monocytes Absolute 0.4 0.1 - 0.9 x10E3/uL   EOS (ABSOLUTE) 0.2 0.0 - 0.4 x10E3/uL   Basophils Absolute 0.1 0.0 - 0.2 x10E3/uL   Immature Granulocytes 1 Not Estab. %   Immature Grans (Abs) 0.1 0.0 - 0.1 x10E3/uL  TSH  Result Value Ref Range   TSH 1.890 0.450 - 4.500 uIU/mL       Pertinent labs & imaging results that were available during my care of the patient were reviewed by me and considered in my medical decision making.  Assessment & Plan:  Matthew May was seen today for right foot redness.  Diagnoses and all orders for this visit:  Podagra Acute idiopathic gout involving toe of right foot Acute gout of right great toe and forefoot. Unable to tolerate prednisone so will tret with below. Will check uric acid level due to recurrent symptoms.  -     colchicine 0.6 MG tablet; Take 1 tablet (0.6 mg total) by mouth daily. 2 tablets now and then 1 tablet in one hour. Then one tablet daily until symptoms resolve. -     Uric Acid     Continue all other maintenance medications.  Follow up plan: Return if symptoms worsen or fail to improve.  Continue healthy lifestyle choices, including diet (rich in fruits, vegetables, and lean proteins, and low in salt and simple carbohydrates) and exercise (at least 30 minutes of moderate physical activity  daily).  Educational handout given for gout  The above assessment and management plan was discussed with the patient. The patient verbalized understanding of and has agreed to the management plan. Patient is aware to call the clinic if they develop any new symptoms or if symptoms persist or worsen. Patient is aware when to return to the clinic for a follow-up visit. Patient educated on when it is appropriate to go to the emergency department.   Monia Pouch, FNP-C [facility] [phone]

## 2019-05-13 ENCOUNTER — Ambulatory Visit (INDEPENDENT_AMBULATORY_CARE_PROVIDER_SITE_OTHER): Payer: Medicare Other | Admitting: Family

## 2019-05-13 ENCOUNTER — Encounter: Payer: Self-pay | Admitting: Family

## 2019-05-13 DIAGNOSIS — R609 Edema, unspecified: Secondary | ICD-10-CM

## 2019-05-13 DIAGNOSIS — I152 Hypertension secondary to endocrine disorders: Secondary | ICD-10-CM

## 2019-05-13 DIAGNOSIS — E1159 Type 2 diabetes mellitus with other circulatory complications: Secondary | ICD-10-CM | POA: Diagnosis not present

## 2019-05-13 DIAGNOSIS — I10 Essential (primary) hypertension: Secondary | ICD-10-CM

## 2019-05-13 MED ORDER — TORSEMIDE 20 MG PO TABS: 40.0000 mg | ORAL_TABLET | Freq: Every day | ORAL | 3 refills | Status: DC

## 2019-05-13 NOTE — Progress Notes (Signed)
   Virtual Visit via telephone Note Due to COVID-19 pandemic this visit was conducted virtually. This visit type was conducted due to national recommendations for restrictions regarding the COVID-19 Pandemic (e.g. social distancing, sheltering in place) in an effort to limit this patient's exposure and mitigate transmission in our community. All issues noted in this document were discussed and addressed.  A physical exam was not performed with this format.  I connected with Matthew May on 05/13/19 at 11:17 AM by telephone and verified that I am speaking with the correct person using two identifiers. Matthew May is currently located at home and no one is currently with him during visit. The provider, Evelina Dun, FNP is located in their office at time of visit.  I discussed the limitations, risks, security and privacy concerns of performing an evaluation and management service by telephone and the availability of in person appointments. I also discussed with the patient that there may be a patient responsible charge related to this service. The patient expressed understanding and agreed to proceed.   History and Present Illness:  HPI  Pt calls the office today for refill on his torsemide 40 mg. He states he has been without it for 4 days because it was not mailed to him. He states he gained "10 lbs overnight" yesterday. He states his weight is normal this morning though. Denies any SOB at this times since his weight went better. He reports peripheral edema that is worse in the evening that improves when he elevates his feet. He is followed by his Cardiologists every 6 months.    Review of Systems  Respiratory: Positive for shortness of breath.   Cardiovascular: Positive for leg swelling.  All other systems reviewed and are negative.    Observations/Objective: No SOB or distress noted   Assessment and Plan: 1. Hypertension associated with diabetes ([place]) - torsemide (DEMADEX) 20  MG tablet; Take 2 tablets (40 mg total) by mouth daily.  Dispense: 180 tablet; Refill: 3  2. Peripheral edema - torsemide (DEMADEX) 20 MG tablet; Take 2 tablets (40 mg total) by mouth daily.  Dispense: 180 tablet; Refill: 3  Restart Demadex 40 mg  Continue to weight daily Low salt diet Keep Cardiologists referral Weight is stable and denies SOB or chest pain at this time    I discussed the assessment and treatment plan with the patient. The patient was provided an opportunity to ask questions and all were answered. The patient agreed with the plan and demonstrated an understanding of the instructions.   The patient was advised to call back or seek an in-person evaluation if the symptoms worsen or if the condition fails to improve as anticipated.  The above assessment and management plan was discussed with the patient. The patient verbalized understanding of and has agreed to the management plan. Patient is aware to call the clinic if symptoms persist or worsen. Patient is aware when to return to the clinic for a follow-up visit. Patient educated on when it is appropriate to go to the emergency department.   Time call ended: 11:33 Am   I provided 16 minutes of non-face-to-face time during this encounter.    Evelina Dun, FNP

## 2019-05-19 ENCOUNTER — Other Ambulatory Visit: Payer: Self-pay

## 2019-05-19 NOTE — Patient Outreach (Signed)
[facility]) Care Management  05/19/2019  Matthew May February 07, 1946 [id number]   Medication Adherence call to Matthew May Telephone call to Patient regarding Medication Adherence unable to reach patient. Matthew May did not answer he is past due on Metformin 1000 mg under [place].   [facility] Management Direct Dial [phone]  Fax [phone] [email or web address]

## 2019-06-02 ENCOUNTER — Telehealth: Payer: Self-pay | Admitting: *Deleted

## 2019-06-02 ENCOUNTER — Other Ambulatory Visit: Payer: Self-pay | Admitting: *Deleted

## 2019-06-02 DIAGNOSIS — R739 Hyperglycemia, unspecified: Secondary | ICD-10-CM | POA: Diagnosis not present

## 2019-06-02 LAB — GLUCOSE HEMOCUE WAIVED: Glu Hemocue Waived: 374 mg/dL — ABNORMAL HIGH (ref 65–99)

## 2019-06-02 NOTE — Telephone Encounter (Signed)
FYI Patient presented with complaints of high blood sugar over 400 but not experiencing any  bad symptoms. Lab did a blood sugar check and number was down to 374.  Per Ms Hassell Done,   patient to go home and drink lots of water and cut back on carbs.  Keep a diary and follow up with his pcp for further care.  He has been advised to go to the emergency room for extreme high blood sugar results greater than 400.

## 2019-06-02 NOTE — Addendum Note (Signed)
Addended by: Shelbie Ammons on: 06/02/2019 12:19 PM   Modules accepted: Orders

## 2019-06-05 ENCOUNTER — Other Ambulatory Visit: Payer: Self-pay | Admitting: Family

## 2019-06-11 ENCOUNTER — Other Ambulatory Visit: Payer: Self-pay | Admitting: Family

## 2019-06-23 ENCOUNTER — Telehealth: Payer: Self-pay | Admitting: Family

## 2019-06-23 DIAGNOSIS — M109 Gout, unspecified: Secondary | ICD-10-CM

## 2019-06-23 DIAGNOSIS — M10071 Idiopathic gout, right ankle and foot: Secondary | ICD-10-CM

## 2019-06-23 MED ORDER — COLCHICINE 0.6 MG PO TABS: 0.6000 mg | ORAL_TABLET | Freq: Every day | ORAL | 0 refills | Status: DC

## 2019-06-23 NOTE — Telephone Encounter (Signed)
What is the name of the medication? Colchicine  Have you contacted your pharmacy to request a refill? No  Which pharmacy would you like this sent to? [facility]   Patient notified that their request is being sent to the clinical staff for review and that they should receive a call once it is complete. If they do not receive a call within 24 hours they can check with their pharmacy or our office.   Lenna Gilford' pt.

## 2019-06-23 NOTE — Telephone Encounter (Signed)
Left message ,  provider advised to get covid injection and to please call our office for any questions.

## 2019-06-23 NOTE — Telephone Encounter (Signed)
Please advise on Covid vaccine  Colchicine has been sent to pharmacy

## 2019-06-23 NOTE — Telephone Encounter (Signed)
Given age and co-morbidities I recommend you get the COVID vaccine.

## 2019-06-27 ENCOUNTER — Encounter: Payer: Medicare Other | Admitting: Nurse Practitioner

## 2019-06-27 NOTE — Progress Notes (Signed)
   Virtual Visit via telephone Note Due to COVID-19 pandemic this visit was conducted virtually. This visit type was conducted due to national recommendations for restrictions regarding the COVID-19 Pandemic (e.g. social distancing, sheltering in place) in an effort to limit this patient's exposure and mitigate transmission in our community. All issues noted in this document were discussed and addressed.  A physical exam was not performed with this format.  I connected with Matthew May on 06/27/19 at 2:50 by telephone and verified that I am speaking with the correct person using two identifiers. Matthew May is currently located at home and am not sure if he was alone or not.The provider, Mary-Margaret Hassell Done, FNP is located in their office at time of visit.    History and Present Illness:  Chief Complaint: Burn   HPI Attempted several times to contact patient by home phone and mobile number. Left message on home number that I was trying to contact him. He called back and was out through to my phone. He said someone was suppose to call him about his leg. I told him I attempted to contact him several times and I left a message. The call then went dead and I was not able to reach him again.   Mary-Margaret Hassell Done, FNP

## 2019-07-14 ENCOUNTER — Telehealth: Payer: Self-pay | Admitting: Family

## 2019-07-15 ENCOUNTER — Telehealth: Payer: Self-pay | Admitting: Family

## 2019-07-15 ENCOUNTER — Ambulatory Visit: Payer: Medicare Other | Admitting: Family

## 2019-07-16 ENCOUNTER — Ambulatory Visit (INDEPENDENT_AMBULATORY_CARE_PROVIDER_SITE_OTHER): Payer: Medicare Other

## 2019-07-16 ENCOUNTER — Ambulatory Visit (INDEPENDENT_AMBULATORY_CARE_PROVIDER_SITE_OTHER): Payer: Medicare Other | Admitting: Family

## 2019-07-16 ENCOUNTER — Encounter: Payer: Self-pay | Admitting: Family

## 2019-07-16 DIAGNOSIS — J449 Chronic obstructive pulmonary disease, unspecified: Secondary | ICD-10-CM | POA: Diagnosis not present

## 2019-07-16 DIAGNOSIS — E038 Other specified hypothyroidism: Secondary | ICD-10-CM

## 2019-07-16 DIAGNOSIS — I10 Essential (primary) hypertension: Secondary | ICD-10-CM

## 2019-07-16 DIAGNOSIS — I350 Nonrheumatic aortic (valve) stenosis: Secondary | ICD-10-CM | POA: Diagnosis not present

## 2019-07-16 DIAGNOSIS — E1165 Type 2 diabetes mellitus with hyperglycemia: Secondary | ICD-10-CM

## 2019-07-16 DIAGNOSIS — R079 Chest pain, unspecified: Secondary | ICD-10-CM | POA: Diagnosis not present

## 2019-07-16 DIAGNOSIS — E1169 Type 2 diabetes mellitus with other specified complication: Secondary | ICD-10-CM

## 2019-07-16 DIAGNOSIS — E1159 Type 2 diabetes mellitus with other circulatory complications: Secondary | ICD-10-CM | POA: Diagnosis not present

## 2019-07-16 DIAGNOSIS — E785 Hyperlipidemia, unspecified: Secondary | ICD-10-CM

## 2019-07-16 DIAGNOSIS — I152 Hypertension secondary to endocrine disorders: Secondary | ICD-10-CM

## 2019-07-16 DIAGNOSIS — E559 Vitamin D deficiency, unspecified: Secondary | ICD-10-CM

## 2019-07-16 LAB — BAYER DCA HB A1C WAIVED: HB A1C (BAYER DCA - WAIVED): 8.4 % — ABNORMAL HIGH (ref <7.0)

## 2019-07-16 NOTE — Progress Notes (Signed)
Virtual Visit via telephone Note Due to COVID-19 pandemic this visit was conducted virtually. This visit type was conducted due to national recommendations for restrictions regarding the COVID-19 Pandemic (e.g. social distancing, sheltering in place) in an effort to limit this patient's exposure and mitigate transmission in our community. All issues noted in this document were discussed and addressed.  A physical exam was not performed with this format.  I connected with Matthew May on 07/16/19 at 8:40 Am by telephone and verified that I am speaking with the correct person using two identifiers. Matthew May is currently located at home  and no one is currently with him during visit. The provider, Evelina Dun, FNP is located in their office at time of visit.  I discussed the limitations, risks, security and privacy concerns of performing an evaluation and management service by telephone and the availability of in person appointments. I also discussed with the patient that there may be a patient responsible charge related to this service. The patient expressed understanding and agreed to proceed.   History and Present Illness:  Ptcallsthe office todaychronic follow up. Pt is followed by Cardiologistsevery 3-6 months for CAD and A Fib. These are stable at this time.   Pt got his first COVID vaccine on 07/10/19. Hypertension This is a chronic problem. The current episode started more than 1 year ago. The problem has been resolved since onset. The problem is controlled. Associated symptoms include peripheral edema and shortness of breath. Pertinent negatives include no blurred vision, headaches or malaise/fatigue. Risk factors for coronary artery disease include obesity, male gender, sedentary lifestyle, dyslipidemia and diabetes mellitus. The current treatment provides moderate improvement. There is no history of kidney disease, CVA or heart failure. Identifiable causes of hypertension  include a thyroid problem.  Diabetes He presents for his follow-up diabetic visit. He has type 2 diabetes mellitus. His disease course has been stable. Hypoglycemia symptoms include nervousness/anxiousness. Pertinent negatives for hypoglycemia include no headaches. Pertinent negatives for diabetes include no blurred vision and no foot paresthesias. Symptoms are stable. Diabetic complications include heart disease. Pertinent negatives for diabetic complications include no CVA. Risk factors for coronary artery disease include diabetes mellitus, dyslipidemia, male sex, hypertension and sedentary lifestyle. He is following a generally healthy diet. His overall blood glucose range is >200 mg/dl.  Hyperlipidemia This is a chronic problem. The current episode started more than 1 year ago. The problem is controlled. Recent lipid tests were reviewed and are normal. Exacerbating diseases include hypothyroidism. Associated symptoms include shortness of breath. Current antihyperlipidemic treatment includes statins. The current treatment provides moderate improvement of lipids. Risk factors for coronary artery disease include dyslipidemia, diabetes mellitus, male sex, hypertension and a sedentary lifestyle.  Thyroid Problem Presents for follow-up visit. Symptoms include anxiety, depressed mood, leg swelling and weight gain. The symptoms have been stable. His past medical history is significant for hyperlipidemia. There is no history of heart failure.  COPD He reports SOB at times, but is stable. Uses Breo daily.    Review of Systems  Constitutional: Positive for weight gain. Negative for malaise/fatigue.  Eyes: Negative for blurred vision.  Respiratory: Positive for shortness of breath.   Neurological: Negative for headaches.  Psychiatric/Behavioral: The patient is nervous/anxious.      Observations/Objective: No SOB or distress noted   Assessment and Plan: 1. Nonrheumatic aortic valve stenos -  CMP14+EGFR - CBC with Differential/Platelet  2. Hypertension associated with diabetes ([place]) - CMP14+EGFR - CBC with Differential/Platelet  3. Chronic  obstructive pulmonary disease, unspecified COPD type ([place]) - CMP14+EGFR - CBC with Differential/Platelet - DG Chest 2 View; Future  4. Type 2 diabetes mellitus with hyperglycemia, without long-term current use of insulin ([facility])  - Bayer DCA Hb A1c Waived - CMP14+EGFR - CBC with Differential/Platelet  5. Other specified hypothyroidism - CMP14+EGFR - CBC with Differential/Platelet - TSH  6. Hyperlipidemia associated with type 2 diabetes mellitus ([facility]) - CMP14+EGFR - CBC with Differential/Platelet  7. Morbid obesity ([place]) - CMP14+EGFR - CBC with Differential/Platelet  8. Vitamin D deficiency - CMP14+EGFR - CBC with Differential/Platelet  Labs pending Compression hose encouraged Low salt diet RTO in 3 months    I discussed the assessment and treatment plan with the patient. The patient was provided an opportunity to ask questions and all were answered. The patient agreed with the plan and demonstrated an understanding of the instructions.   The patient was advised to call back or seek an in-person evaluation if the symptoms worsen or if the condition fails to improve as anticipated.  The above assessment and management plan was discussed with the patient. The patient verbalized understanding of and has agreed to the management plan. Patient is aware to call the clinic if symptoms persist or worsen. Patient is aware when to return to the clinic for a follow-up visit. Patient educated on when it is appropriate to go to the emergency department.   Time call ended:  9:03 AM  I provided 23 minutes of non-face-to-face time during this encounter.    Evelina Dun, FNP

## 2019-07-17 LAB — CBC WITH DIFFERENTIAL/PLATELET
Basophils Absolute: 0.1 10*3/uL (ref 0.0–0.2)
Basos: 1 %
EOS (ABSOLUTE): 0.2 10*3/uL (ref 0.0–0.4)
Eos: 3 %
Hematocrit: 42.8 % (ref 37.5–51.0)
Hemoglobin: 14.1 g/dL (ref 13.0–17.7)
Immature Grans (Abs): 0 10*3/uL (ref 0.0–0.1)
Immature Granulocytes: 1 %
Lymphocytes Absolute: 1.5 10*3/uL (ref 0.7–3.1)
Lymphs: 20 %
MCH: 29.2 pg (ref 26.6–33.0)
MCHC: 32.9 g/dL (ref 31.5–35.7)
MCV: 89 fL (ref 79–97)
Monocytes Absolute: 0.4 10*3/uL (ref 0.1–0.9)
Monocytes: 5 %
Neutrophils Absolute: 5.4 10*3/uL (ref 1.4–7.0)
Neutrophils: 70 %
Platelets: 182 10*3/uL (ref 150–450)
RBC: 4.83 x10E6/uL (ref 4.14–5.80)
RDW: 13 % (ref 11.6–15.4)
WBC: 7.7 10*3/uL (ref 3.4–10.8)

## 2019-07-17 LAB — CMP14+EGFR
ALT: 62 IU/L — ABNORMAL HIGH (ref 0–44)
AST: 46 IU/L — ABNORMAL HIGH (ref 0–40)
Albumin/Globulin Ratio: 1.4 (ref 1.2–2.2)
Albumin: 4.6 g/dL (ref 3.7–4.7)
Alkaline Phosphatase: 81 IU/L (ref 39–117)
BUN/Creatinine Ratio: 15 (ref 10–24)
BUN: 15 mg/dL (ref 8–27)
Bilirubin Total: 0.5 mg/dL (ref 0.0–1.2)
CO2: 25 mmol/L (ref 20–29)
Calcium: 9.9 mg/dL (ref 8.6–10.2)
Chloride: 95 mmol/L — ABNORMAL LOW (ref 96–106)
Creatinine, Ser: 0.98 mg/dL (ref 0.76–1.27)
GFR calc Af Amer: 89 mL/min/{1.73_m2} (ref >59)
GFR calc non Af Amer: 77 mL/min/{1.73_m2} (ref >59)
Globulin, Total: 3.2 g/dL (ref 1.5–4.5)
Glucose: 246 mg/dL — ABNORMAL HIGH (ref 65–99)
Potassium: 4.4 mmol/L (ref 3.5–5.2)
Sodium: 140 mmol/L (ref 134–144)
Total Protein: 7.8 g/dL (ref 6.0–8.5)

## 2019-07-17 LAB — TSH: TSH: 2.61 u[IU]/mL (ref 0.450–4.500)

## 2019-07-21 ENCOUNTER — Other Ambulatory Visit: Payer: Self-pay | Admitting: Family

## 2019-07-28 ENCOUNTER — Other Ambulatory Visit: Payer: Self-pay | Admitting: Family

## 2019-07-28 DIAGNOSIS — J42 Unspecified chronic bronchitis: Secondary | ICD-10-CM

## 2019-07-28 MED ORDER — GLIMEPIRIDE 2 MG PO TABS: 2.0000 mg | ORAL_TABLET | Freq: Every day | ORAL | 1 refills | Status: DC

## 2019-07-30 ENCOUNTER — Ambulatory Visit (INDEPENDENT_AMBULATORY_CARE_PROVIDER_SITE_OTHER): Payer: Medicare Other | Admitting: *Deleted

## 2019-07-30 DIAGNOSIS — Z0000 Encounter for general adult medical examination without abnormal findings: Secondary | ICD-10-CM | POA: Diagnosis not present

## 2019-07-30 NOTE — Progress Notes (Signed)
MEDICARE ANNUAL WELLNESS VISIT  07/30/2019  Telephone Visit Disclaimer This Medicare AWV was conducted by telephone due to national recommendations for restrictions regarding the COVID-19 Pandemic (e.g. social distancing).  I verified, using two identifiers, that I am speaking with Matthew May or their authorized healthcare agent. I discussed the limitations, risks, security, and privacy concerns of performing an evaluation and management service by telephone and the potential availability of an in-person appointment in the future. The patient expressed understanding and agreed to proceed.   Subjective:  Matthew May is a 74 y.o. male patient of [name], Theador Hawthorne, FNP who had a Medicare Annual Wellness Visit today via telephone. Matthew May is retired and lives with his wife Matthew May and his 31 year old grandson is residing with them as well. He has 3 grown children who all live locally. He reports that he is socially active and does interact with friends/family regularly. He is minimally physically active and enjoys shopping.  Patient Care Team: Sharion Balloon, FNP as PCP - General (Nurse Practitioner) Minus Breeding, MD as PCP - Cardiology (Cardiology) Minus Breeding, MD as Consulting Physician (Cardiology)  Advanced Directives 07/30/2019 04/26/2018 12/30/2017 06/15/2017 01/18/2017 01/18/2017 01/16/2017  Does Patient Have a Medical Advance Directive? No No No No No No No  Would patient like information on creating a medical advance directive? No - Patient declined No - Patient declined No - Patient declined - No - Patient declined No - Patient declined -    Hospital Utilization Over the Past 12 Months: # of hospitalizations or ER visits: 0 # of surgeries: 0  Review of Systems    Patient reports that his overall health is unchanged compared to last year.  History obtained from the patient and patient chart.   Patient Reported Readings (BP, Pulse, CBG, Weight, etc) none  Pain  Assessment Pain : 0-10 Pain Score: 6  Pain Type: Chronic pain Pain Location: Leg Pain Orientation: Other (Comment)(bilateral legs below knee) Pain Descriptors / Indicators: Aching Pain Onset: (years) Pain Frequency: Constant Pain Relieving Factors: nothing Effect of Pain on Daily Activities: none  Pain Relieving Factors: nothing  Current Medications & Allergies (verified) Allergies as of 07/30/2019      Reactions   Prednisone Other (See Comments)   Pt states "sugar went to 580"   Jardiance [empagliflozin] Rash   Lisinopril Cough      Medication List       Accurate as of July 30, 2019  8:55 AM. If you have any questions, ask your nurse or doctor.        albuterol 108 (90 Base) MCG/ACT inhaler Commonly known as: VENTOLIN HFA Inhale 2 puffs into the lungs every 6 (six) hours as needed for wheezing or shortness of breath.   apixaban 5 MG Tabs tablet Commonly known as: Eliquis Take 1 tablet (5 mg total) by mouth 2 (two) times daily.   atorvastatin 40 MG tablet Commonly known as: LIPITOR TAKE 1 TABLET BY MOUTH  DAILY   Breo Ellipta 100-25 MCG/INH Aepb Generic drug: fluticasone furoate-vilanterol Inhale 1 puff by mouth once daily   cholecalciferol 25 MCG (1000 UNIT) tablet Commonly known as: VITAMIN D3 Take 1,000 Units by mouth daily.   colchicine 0.6 MG tablet Take 1 tablet (0.6 mg total) by mouth daily. 2 tablets now and then 1 tablet in one hour. Then one tablet daily until symptoms resolve.   glimepiride 2 MG tablet Commonly known as: AMARYL Take 1 tablet (2 mg total) by  mouth daily with breakfast.   levothyroxine 150 MCG tablet Commonly known as: Synthroid Take 1 tablet (150 mcg total) by mouth daily.   metFORMIN 1000 MG tablet Commonly known as: GLUCOPHAGE TAKE 1 TABLET BY MOUTH TWO  TIMES DAILY   nitroGLYCERIN 0.4 MG SL tablet Commonly known as: NITROSTAT DISSOLVE ONE TABLET UNDER THE TONGUE EVERY 5 MINUTES AS NEEDED FOR CHEST PAIN.  DO NOT EXCEED  A TOTAL OF 3 DOSES IN 15 MINUTES   OneTouch Delica Plus 0000000 Misc 1 each by Other route daily. Dx E11.9   OneTouch Verio test strip Generic drug: glucose blood Test BS daily Dx E11.9   potassium chloride SA 20 MEQ tablet Commonly known as: KLOR-CON TAKE 1 TABLET BY MOUTH  DAILY   sitaGLIPtin 100 MG tablet Commonly known as: Januvia Take 1 tablet (100 mg total) by mouth daily.   torsemide 20 MG tablet Commonly known as: DEMADEX Take 2 tablets (40 mg total) by mouth daily.   valsartan 40 MG tablet Commonly known as: DIOVAN TAKE ONE-HALF TABLET BY  MOUTH DAILY       History (reviewed): Past Medical History:  Diagnosis Date  . Aortic stenosis   . Atrial fibrillation ([place])   . CAD (coronary artery disease)    a. s/p NSTEMI 4/11 => s/p CABG (L-LAD, S-OM2, S-PDA/PL);  b.  ETT-Myoview 6/14:  normal study, no ischemia, EF 67%  . Carotid stenosis    Carotid U/S [id number]:  RICA 123456, LICA 123456; right vertebral flow retrograde suggestive of steel-consider PV consult  . COPD (chronic obstructive pulmonary disease) ([place])   . HLD (hyperlipidemia)   . HTN (hypertension)   . Hx of echocardiogram    Echo 6/14: Mod LVH, focal basal hypertrophy, EF 50-55%, mild AS (mean 17 mmHg) and mild AI, mild to mod LAE, mild RAE  . Obesity   . Renal insufficiency   . Subclavian artery stenosis, right ([place])    based upon carotid U/S done 11/2012   Past Surgical History:  Procedure Laterality Date  . CORONARY ARTERY BYPASS GRAFT     2011, LIMA to LAD coronary artery, SVG to OM2 branch of lect circumflex coronary artery, and a sequential SVG to psot descening to posterolateral branches to RCA  . endoscopic vein harvesting     right leg   . HERNIA REPAIR    . RIGHT/LEFT HEART CATH AND CORONARY/GRAFT ANGIOGRAPHY N/A 12/31/2017   Procedure: RIGHT/LEFT HEART CATH AND CORONARY/GRAFT ANGIOGRAPHY;  Surgeon: Leonie Man, MD;  Location: [facility];  Service: Cardiovascular;  Laterality:  N/A;  . TONSILLECTOMY     Family History  Problem Relation Age of Onset  . Heart disease Father   . Heart attack Father   . Mental illness Mother   . Mental illness Sister   . Diabetes Brother   . Depression Maternal Aunt    Social History   Socioeconomic History  . Marital status: Married    Spouse name: Matthew May  . Number of children: 3  . Years of education: 8  . Highest education level: 8th grade  Occupational History  . Occupation: Retired    Comment: scrap yard  Tobacco Use  . Smoking status: Former Smoker    Quit date: 07/11/2009    Years since quitting: 10.0  . Smokeless tobacco: Never Used  . Tobacco comment: smoked about 10 cig/day; used to smoke 2 ppd for many years   Substance and Sexual Activity  . Alcohol use: No  Alcohol/week: 0.0 standard drinks  . Drug use: No  . Sexual activity: Never  Other Topics Concern  . Not on file  Social History Narrative   Lives at home with his wife. He is retired but his wife continues to work daily. They have adult children that live locally and grandchildren that they spend time with regularly. He is active around his home and yard.    Social Determinants of Health   Financial Resource Strain:   . Difficulty of Paying Living Expenses: Not on file  Food Insecurity:   . Worried About Charity fundraiser in the Last Year: Not on file  . Ran Out of Food in the Last Year: Not on file  Transportation Needs:   . Lack of Transportation (Medical): Not on file  . Lack of Transportation (Non-Medical): Not on file  Physical Activity:   . Days of Exercise per Week: Not on file  . Minutes of Exercise per Session: Not on file  Stress:   . Feeling of Stress : Not on file  Social Connections:   . Frequency of Communication with Friends and Family: Not on file  . Frequency of Social Gatherings with Friends and Family: Not on file  . Attends Religious Services: Not on file  . Active Member of Clubs or Organizations: Not on file  .  Attends Archivist Meetings: Not on file  . Marital Status: Not on file    Activities of Daily Living In your present state of health, do you have any difficulty performing the following activities: 07/30/2019  Hearing? N  Vision? N  Difficulty concentrating or making decisions? N  Walking or climbing stairs? N  Dressing or bathing? N  Doing errands, shopping? N  Preparing Food and eating ? N  Using the Toilet? N  In the past six months, have you accidently leaked urine? N  Do you have problems with loss of bowel control? N  Managing your Medications? N  Managing your Finances? N  Housekeeping or managing your Housekeeping? N  Some recent data might be hidden    Patient Education/ Literacy How often do you need to have someone help you when you read instructions, pamphlets, or other written materials from your doctor or pharmacy?: 1 - Never What is the last grade level you completed in school?: 8th  Exercise Current Exercise Habits: Home exercise routine(exercised feet and legs nightly and walks during day), Type of exercise: walking;stretching, Time (Minutes): 30, Frequency (Times/Week): 7, Weekly Exercise (Minutes/Week): 210, Intensity: Mild, Exercise limited by: None identified  Diet Patient reports consuming 3 meals a day and 3 snack(s) a day Patient reports that his primary diet is: low carb Patient reports that she does have regular access to food.   Depression Screen PHQ 2/9 Scores 07/30/2019 04/18/2019 04/02/2019 09/09/2018 06/10/2018 04/26/2018 03/08/2018  PHQ - 2 Score 0 0 0 0 0 0 0     Fall Risk Fall Risk  07/30/2019 04/18/2019 04/02/2019 03/13/2019 03/13/2019  Falls in the past year? 0 0 0 0 0  Number falls in past yr: - - - - -  Injury with Fall? - - - - -  Risk for fall due to : - - - - -  Risk for fall due to: Comment - - - - -  Follow up - - - - -     Objective:  Matthew Pigeon Andersson seemed alert and oriented and he participated appropriately during our  telephone visit.  Blood Pressure  Weight BMI  BP Readings from Last 3 Encounters:  04/18/19 110/70  04/02/19 124/68  03/13/19 113/78   Wt Readings from Last 3 Encounters:  04/18/19 240 lb (108.9 kg)  04/02/19 241 lb 12.8 oz (109.7 kg)  03/13/19 241 lb 6.4 oz (109.5 kg)   BMI Readings from Last 1 Encounters:  04/18/19 37.03 kg/m    *Unable to obtain current vital signs, weight, and BMI due to telephone visit type  Hearing/Vision  . Marshun did not seem to have difficulty with hearing/understanding during the telephone conversation . Reports that he has not had a formal eye exam by an eye care professional within the past year . Reports that he has not had a formal hearing evaluation within the past year *Unable to fully assess hearing and vision during telephone visit type  Cognitive Function: 6CIT Screen 07/30/2019 04/26/2018  What Year? 0 points 0 points  What month? 0 points 0 points  What time? 0 points 0 points  Count back from 20 0 points 0 points  Months in reverse 2 points 0 points  Repeat phrase 0 points 0 points  Total Score 2 0   (Normal:0-7, Significant for Dysfunction: >8)  Normal Cognitive Function Screening: Yes   Immunization & Health Maintenance Record Immunization History  Administered Date(s) Administered  . Fluad Quad(high Dose 65+) 02/07/2019  . Influenza, High Dose Seasonal PF 03/31/2014, 04/24/2015, 03/29/2017, 03/08/2018  . Influenza-Unspecified 02/03/2013, 03/31/2016, 03/29/2017  . Pneumococcal Conjugate-13 09/16/2014  . Pneumococcal Polysaccharide-23 08/06/2012    Health Maintenance  Topic Date Due  . Fecal DNA (Cologuard)  08/06/1997  . OPHTHALMOLOGY EXAM  01/26/2019  . TETANUS/TDAP  10/04/2019  . HEMOGLOBIN A1C  01/13/2020  . FOOT EXAM  03/12/2020  . INFLUENZA VACCINE  Completed  . Hepatitis C Screening  Completed  . PNA vac Low Risk Adult  Completed       Assessment  This is a routine wellness examination for [employer].  Health Maintenance: Due or Overdue Health Maintenance Due  Topic Date Due  . Fecal DNA (Cologuard)  08/06/1997  . OPHTHALMOLOGY EXAM  01/26/2019    Matthew Pigeon Dart does not need a referral for [employer] Assistance: Care Management:   no Social Work:    no Prescription Assistance:  no Nutrition/Diabetes Education:  no   Plan:  Personalized Goals Goals Addressed            This Visit's Progress   . Patient Stated       07/30/2019 AWV Goal: Improved Nutrition/Diet  . Patient will verbalize understanding that diet plays an important role in overall health and that a poor diet is a risk factor for many chronic medical conditions.  . Over the next year, patient will improve self management of their diet by incorporating low carb choices. . Patient will utilize available community resources to help with food acquisition if needed (ex: food pantries, [address], etc) .       Personalized Health Maintenance & Screening Recommendations  Colorectal cancer screening and eye exam  Lung Cancer Screening Recommended: yes (Low Dose CT Chest recommended if Age 24-80 years, 30 pack-year currently smoking OR have quit w/in past 15 years) Hepatitis C Screening recommended: no HIV Screening recommended: no  Advanced Directives: Written information was not prepared per patient's request.  Referrals & Orders No orders of the defined types were placed in this encounter.   Follow-up Plan . Follow-up with Sharion Balloon, FNP as planned .  I have personally reviewed and noted the following in the patient's chart:   . Medical and social history . Use of alcohol, tobacco or illicit drugs  . Current medications and supplements . Functional ability and status . Nutritional status . Physical activity . Advanced directives . List of other physicians . Hospitalizations, surgeries, and ER visits in previous 12 months . Vitals . Screenings to include cognitive, depression, and  falls . Referrals and appointments  In addition, I have reviewed and discussed with Matthew Pigeon Robledo certain preventive protocols, quality metrics, and best practice recommendations. A written personalized care plan for preventive services as well as general preventive health recommendations is available and can be mailed to the patient at his request.      Baldomero Lamy, LPN [phone]

## 2019-08-02 ENCOUNTER — Other Ambulatory Visit: Payer: Self-pay | Admitting: Family

## 2019-08-04 ENCOUNTER — Telehealth: Payer: Self-pay | Admitting: Family

## 2019-08-04 NOTE — Chronic Care Management (AMB) (Signed)
  Chronic Care Management   Note  08/04/2019 Name: Matthew May MRN: [id number] DOB: 10-10-1945  Matthew May is a 74 y.o. year old male who is a primary care patient of Sharion Balloon, FNP. I reached out to [employer] by phone today in response to a referral sent by [name]'s health plan.     [name] was given information about Chronic Care Management services today including:  1. CCM service includes personalized support from designated clinical staff supervised by his physician, including individualized plan of care and coordination with other care providers 2. 24/7 contact phone numbers for assistance for urgent and routine care needs. 3. Service will only be billed when office clinical staff spend 20 minutes or more in a month to coordinate care. 4. Only one practitioner may furnish and bill the service in a calendar month. 5. The patient may stop CCM services at any time (effective at the end of the month) by phone call to the office staff. 6. The patient will be responsible for cost sharing (co-pay) of up to 20% of the service fee (after annual deductible is met).  Patient did not agree to enrollment in care management services and does not wish to consider at this time.  Follow up plan: The patient has been provided with contact information for the care management team and has been advised to call with any health related questions or concerns.   Noreene Larsson, [place] [place], [place], [place] [address] Direct Dial: [phone] [email or web address] Website: [email or web address]

## 2019-09-01 ENCOUNTER — Other Ambulatory Visit: Payer: Self-pay | Admitting: Family

## 2019-09-01 ENCOUNTER — Telehealth: Payer: Self-pay | Admitting: Family Medicine

## 2019-09-01 MED ORDER — METFORMIN HCL 1000 MG PO TABS: 1000.0000 mg | ORAL_TABLET | Freq: Two times a day (BID) | ORAL | 0 refills | Status: DC

## 2019-09-01 MED ORDER — APIXABAN 5 MG PO TABS: 5.0000 mg | ORAL_TABLET | Freq: Two times a day (BID) | ORAL | 0 refills | Status: DC

## 2019-09-01 NOTE — Telephone Encounter (Signed)
  Prescription Request  09/01/2019  What is the name of the medication or equipment? metFORMIN (GLUCOPHAGE) 1000 MG tablet ( pt usually gets this at mail order but they are behind on sending them ) and apixaban (ELIQUIS) 5 MG TABS tablet  Have you contacted your pharmacy to request a refill? (if applicable) yes  Which pharmacy would you like this sent to? [facility]   Patient notified that their request is being sent to the clinical staff for review and that they should receive a response within 2 business days.

## 2019-09-01 NOTE — Telephone Encounter (Signed)
Pt aware medication sent to [facility]

## 2019-09-03 ENCOUNTER — Other Ambulatory Visit: Payer: Self-pay | Admitting: *Deleted

## 2019-09-03 MED ORDER — APIXABAN 5 MG PO TABS: 5.0000 mg | ORAL_TABLET | Freq: Two times a day (BID) | ORAL | 0 refills | Status: DC

## 2019-09-09 DIAGNOSIS — Z7189 Other specified counseling: Secondary | ICD-10-CM | POA: Insufficient documentation

## 2019-09-09 NOTE — Progress Notes (Signed)
Cardiology Office Note   Date:  09/10/2019   ID:  Matthew May, DOB 1946/04/24, MRN [id number]  PCP:  Sharion Balloon, FNP  Cardiologist:   Minus Breeding, MD   Chief Complaint  Patient presents with  . Leg Swelling      History of Present Illness: Matthew May is a 74 y.o. male who presents for followup of his known coronary disease.   He was in the hospital in July 2019 with presyncope.  He had a cath with results as below.  Cath demonstrated moderate AS.   He was sent home and PFTs were obtained.  He did not have any significant findings consistent with lung disease.  He continues to having dizzy spells since his discharge in late July.   He has been found to have atrial fib.  He has been started on Eliquis.    He also had his Torsemide reduced. His dizziness was much improved at the last visit.    Since I last saw him he has done relatively well.  He has not had any of the dizziness that he was having and has not had any presyncope or syncope.  He denies any chest pressure, neck or arm discomfort.  He is not having any palpitations.  He was able to feel his garden recently without significant limitations.  He has had some lower extremity swelling that he says is getting worse but in retrospect he said that since his bypass.  It is slightly worse left greater than right.  He says he does not watch his salt.  He is on his feet a fair amount.  Past Medical History:  Diagnosis Date  . Aortic stenosis   . Atrial fibrillation ([place])   . CAD (coronary artery disease)    a. s/p NSTEMI 4/11 => s/p CABG (L-LAD, S-OM2, S-PDA/PL);  b.  ETT-Myoview 6/14:  normal study, no ischemia, EF 67%  . Carotid stenosis    Carotid U/S 123XX123:  RICA 123456, LICA 123456; right vertebral flow retrograde suggestive of steel-consider PV consult  . COPD (chronic obstructive pulmonary disease) ([place])   . Diabetes mellitus without complication ([place])   . HLD (hyperlipidemia)   . HTN (hypertension)   . Hx  of echocardiogram    Echo 6/14: Mod LVH, focal basal hypertrophy, EF 50-55%, mild AS (mean 17 mmHg) and mild AI, mild to mod LAE, mild RAE  . Obesity   . Renal insufficiency   . Subclavian artery stenosis, right ([place])    based upon carotid U/S done 11/2012    Past Surgical History:  Procedure Laterality Date  . CORONARY ARTERY BYPASS GRAFT     2011, LIMA to LAD coronary artery, SVG to OM2 branch of lect circumflex coronary artery, and a sequential SVG to psot descening to posterolateral branches to RCA  . endoscopic vein harvesting     right leg   . HERNIA REPAIR    . RIGHT/LEFT HEART CATH AND CORONARY/GRAFT ANGIOGRAPHY N/A 12/31/2017   Procedure: RIGHT/LEFT HEART CATH AND CORONARY/GRAFT ANGIOGRAPHY;  Surgeon: Leonie Man, MD;  Location: [facility];  Service: Cardiovascular;  Laterality: N/A;  . TONSILLECTOMY       Current Outpatient Medications  Medication Sig Dispense Refill  . albuterol (PROVENTIL HFA;VENTOLIN HFA) 108 (90 Base) MCG/ACT inhaler Inhale 2 puffs into the lungs every 6 (six) hours as needed for wheezing or shortness of breath. 1 Inhaler 0  . apixaban (ELIQUIS) 5 MG TABS tablet Take 1  tablet (5 mg total) by mouth 2 (two) times daily. 180 tablet 0  . atorvastatin (LIPITOR) 40 MG tablet TAKE 1 TABLET BY MOUTH  DAILY 90 tablet 3  . BREO ELLIPTA 100-25 MCG/INH AEPB Inhale 1 puff by mouth once daily 60 each 0  . cholecalciferol (VITAMIN D3) 25 MCG (1000 UT) tablet Take 1,000 Units by mouth daily.    . colchicine 0.6 MG tablet Take 1 tablet (0.6 mg total) by mouth daily. 2 tablets now and then 1 tablet in one hour. Then one tablet daily until symptoms resolve. 30 tablet 0  . glimepiride (AMARYL) 2 MG tablet Take 1 tablet (2 mg total) by mouth daily with breakfast. 90 tablet 1  . levothyroxine (SYNTHROID) 150 MCG tablet TAKE 1 TABLET BY MOUTH  DAILY 90 tablet 3  . metFORMIN (GLUCOPHAGE) 1000 MG tablet Take 1 tablet (1,000 mg total) by mouth 2 (two) times daily. 60  tablet 0  . nitroGLYCERIN (NITROSTAT) 0.4 MG SL tablet DISSOLVE ONE TABLET UNDER THE TONGUE EVERY 5 MINUTES AS NEEDED FOR CHEST PAIN.  DO NOT EXCEED A TOTAL OF 3 DOSES IN 15 MINUTES 25 tablet 0  . potassium chloride SA (KLOR-CON) 20 MEQ tablet TAKE 1 TABLET BY MOUTH  DAILY 90 tablet 3  . sitaGLIPtin (JANUVIA) 100 MG tablet Take 1 tablet (100 mg total) by mouth daily. 90 tablet 1  . torsemide (DEMADEX) 20 MG tablet Take 2 tablets (40 mg total) by mouth daily. 180 tablet 3  . valsartan (DIOVAN) 40 MG tablet TAKE ONE-HALF TABLET BY  MOUTH DAILY 45 tablet 1  . glucose blood (ONETOUCH VERIO) test strip Test BS daily Dx E11.9 100 strip 3  . Lancets (ONETOUCH DELICA PLUS 123XX123) MISC 1 each by Other route daily. Dx E11.9 100 each 3   No current facility-administered medications for this visit.    Allergies:   Prednisone, Jardiance [empagliflozin], and Lisinopril    ROS:  Please see the history of present illness.   Otherwise, review of systems are positive for none.   All other systems are reviewed and negative.    PHYSICAL EXAM: VS:  BP (!) 142/84   Pulse 70   Temp 99.2 F (37.3 C)   Ht 5' 6.5" (1.689 m)   Wt 242 lb (109.8 kg)   BMI 38.47 kg/m  , BMI Body mass index is 38.47 kg/m. GENERAL:  Well appearing HEENT:  Pupils equal round and reactive, fundi not visualized, oral mucosa unremarkable NECK:  No jugular venous distention, waveform within normal limits, carotid upstroke brisk and symmetric, no bruits, no thyromegaly LYMPHATICS:  No cervical, inguinal adenopathy LUNGS:  Clear to auscultation bilaterally BACK:  No CVA tenderness CHEST:  Unremarkable HEART:  PMI not displaced or sustained,S1 and S2 within normal limits, no S3, no S4, no clicks, no rubs, 3 out of 6 apical systolic murmur mid peaking, radiating at the aortic outflow tract, no diastolic murmurs ABD:  Flat, positive bowel sounds normal in frequency in pitch, no bruits, no rebound, no guarding, no midline pulsatile  mass, no hepatomegaly, no splenomegaly EXT:  2 plus pulses throughout, mild to moderate left greater than right edema, no cyanosis no clubbing SKIN:  No rashes no nodules, chronic venous stasis NEURO:  Cranial nerves II through XII grossly intact, motor grossly intact throughout PSYCH:  Cognitively intact, oriented to person place and time    EKG:  EKG is ordered today. The ekg ordered today demonstrates normal sinus rhythm, premature ectopic contractions, lateral T wave  changes possible ischemia but unchanged from previous   Recent Labs: 07/16/2019: ALT 62; BUN 15; Creatinine, Ser 0.98; Hemoglobin 14.1; Platelets 182; Potassium 4.4; Sodium 140; TSH 2.610    Lipid Panel    Component Value Date/Time   CHOL 123 06/10/2018 0933   CHOL 138 12/26/2012 1310   TRIG 127 06/10/2018 0933   TRIG 90 05/23/2013 1452   TRIG 108 12/26/2012 1310   HDL 29 (L) 06/10/2018 0933   HDL 34 (L) 05/23/2013 1452   HDL 33 (L) 12/26/2012 1310   CHOLHDL 4.2 06/10/2018 0933   CHOLHDL 5.9 10/01/2009 0700   VLDL 16 10/01/2009 0700   LDLCALC 69 06/10/2018 0933   LDLCALC 81 05/23/2013 1452   LDLCALC 83 12/26/2012 1310      Wt Readings from Last 3 Encounters:  09/10/19 242 lb (109.8 kg)  04/18/19 240 lb (108.9 kg)  04/02/19 241 lb 12.8 oz (109.7 kg)      Other studies Reviewed: Additional studies/ records that were reviewed today include: Labs. Review of the above records demonstrates:  Please see elsewhere in the note.     ASSESSMENT AND PLAN:  ATRIAL FIB:   Mr. Eyoab Laduca Wren has a CHA2DS2 - VASc score of 3.  He has not had any symptomatic paroxysms.  No change in therapy.   CORONARY ATHEROSCLEROSIS NATIVE CORONARY ARTERY - He had an occluded vein graft as described in the anatomy above.  He has no ongoing angina.  No change in therapy.  HYPERTENSION -  The blood pressure is at target.  No change in therapy.    DYSLIPIDEMIA -   LDL was 69.  I will defer to his primary.    AS - This was  moderate on cath.  I will follow up with an echo.     CAROTID STENOSIS - He had less than 40% bilateral stenosis in August of last 2019.   He will need follow up Doppler.   OBESITY - He and I have had discussions about this.   DM - His last A1c was 8.5.  We discussed this at length today and he understands is not currently in fact going up.  COVID EDUCATION - He has had both his vaccines.  Current medicines are reviewed at length with the patient today.  The patient does not have concerns regarding medicines.  The following changes have been made:  no change  Labs/ tests ordered today include:   Orders Placed This Encounter  Procedures  . US Carotid Bilateral  . EKG 12-Lead  . ECHOCARDIOGRAM COMPLETE     Disposition:   FU with me in one year.     Signed, Minus Breeding, MD  09/10/2019 2:00 PM    [place]

## 2019-09-10 ENCOUNTER — Ambulatory Visit (INDEPENDENT_AMBULATORY_CARE_PROVIDER_SITE_OTHER): Payer: Medicare Other | Admitting: Cardiology

## 2019-09-10 ENCOUNTER — Other Ambulatory Visit: Payer: Self-pay

## 2019-09-10 ENCOUNTER — Encounter: Payer: Self-pay | Admitting: Cardiology

## 2019-09-10 VITALS — BP 142/84 | HR 70 | Temp 99.2°F | Ht 66.5 in | Wt 242.0 lb

## 2019-09-10 DIAGNOSIS — E785 Hyperlipidemia, unspecified: Secondary | ICD-10-CM

## 2019-09-10 DIAGNOSIS — I10 Essential (primary) hypertension: Secondary | ICD-10-CM

## 2019-09-10 DIAGNOSIS — I350 Nonrheumatic aortic (valve) stenosis: Secondary | ICD-10-CM | POA: Diagnosis not present

## 2019-09-10 DIAGNOSIS — I480 Paroxysmal atrial fibrillation: Secondary | ICD-10-CM

## 2019-09-10 DIAGNOSIS — I2510 Atherosclerotic heart disease of native coronary artery without angina pectoris: Secondary | ICD-10-CM | POA: Diagnosis not present

## 2019-09-10 DIAGNOSIS — Z7189 Other specified counseling: Secondary | ICD-10-CM

## 2019-09-10 DIAGNOSIS — I6529 Occlusion and stenosis of unspecified carotid artery: Secondary | ICD-10-CM

## 2019-09-10 DIAGNOSIS — E118 Type 2 diabetes mellitus with unspecified complications: Secondary | ICD-10-CM

## 2019-09-10 NOTE — Patient Instructions (Addendum)
Medication Instructions:  The current medical regimen is effective;  continue present plan and medications.  *If you need a refill on your cardiac medications before your next appointment, please call your pharmacy*  Testing/Procedures: Your physician has requested that you have an echocardiogram. Echocardiography is a painless test that uses sound waves to create images of your heart. It provides your doctor with information about the size and shape of your heart and how well your heart's chambers and valves are working. This procedure takes approximately one hour. There are no restrictions for this procedure.  Please report to the Main Entrance at [facility] Tuesday 4/13 at 9:15 am.    Your physician has requested that you have a carotid duplex. This test is an ultrasound of the carotid arteries in your neck. It looks at blood flow through these arteries that supply the brain with blood. Allow one hour for this exam. There are no restrictions or special instructions.   Follow-Up: At [facility], you and your health needs are our priority.  As part of our continuing mission to provide you with exceptional heart care, we have created designated Provider Care Teams.  These Care Teams include your primary Cardiologist (physician) and Advanced Practice Providers (APPs -  Physician Assistants and Nurse Practitioners) who all work together to provide you with the care you need, when you need it.  We recommend signing up for the patient portal called "MyChart".  Sign up information is provided on this After Visit Summary.  MyChart is used to connect with patients for Virtual Visits (Telemedicine).  Patients are able to view lab/test results, encounter notes, upcoming appointments, etc.  Non-urgent messages can be sent to your provider as well.   To learn more about what you can do with MyChart, go to [email or web address].    Your next appointment:   12 months  The format for your next  appointment:   In Person  Provider:   Minus Breeding, MD   Thank you for choosing [facility]!!

## 2019-09-16 ENCOUNTER — Ambulatory Visit (HOSPITAL_COMMUNITY)
Admission: RE | Admit: 2019-09-16 | Discharge: 2019-09-16 | Disposition: A | Discharge status: Home / Self Care | Payer: Medicare Other | Source: Ambulatory Visit | Attending: Cardiology | Admitting: Cardiology

## 2019-09-16 ENCOUNTER — Other Ambulatory Visit: Payer: Self-pay

## 2019-09-16 DIAGNOSIS — I350 Nonrheumatic aortic (valve) stenosis: Secondary | ICD-10-CM | POA: Insufficient documentation

## 2019-09-16 DIAGNOSIS — I6529 Occlusion and stenosis of unspecified carotid artery: Secondary | ICD-10-CM

## 2019-09-16 DIAGNOSIS — I6523 Occlusion and stenosis of bilateral carotid arteries: Secondary | ICD-10-CM | POA: Diagnosis not present

## 2019-09-16 NOTE — Progress Notes (Signed)
*  PRELIMINARY RESULTS* Echocardiogram 2D Echocardiogram has been performed.  Samuel Germany 09/16/2019, 12:05 PM

## 2019-09-18 ENCOUNTER — Other Ambulatory Visit: Payer: Self-pay | Admitting: Family

## 2019-09-23 ENCOUNTER — Telehealth: Payer: Self-pay | Admitting: Cardiology

## 2019-09-23 NOTE — Telephone Encounter (Signed)
  Patient would like to speak to the nurse regarding concerns with his carotid study results

## 2019-09-23 NOTE — Telephone Encounter (Signed)
50 - 60% right stenosis and less than 39% left . Follow up in one year. Call Mr. Osterhoudt with the results and send results to Sharion Balloon, FNP  Reviewed results of both carotid and echo with patient again.  He is scheduled for f/u with Dr Percival Spanish in [place] office as ordered in 3 months.  Pt will c/b prior to then if any questions or concerns.

## 2019-09-24 ENCOUNTER — Telehealth: Payer: Self-pay | Admitting: Family

## 2019-10-16 ENCOUNTER — Other Ambulatory Visit: Payer: Self-pay | Admitting: Family

## 2019-10-16 DIAGNOSIS — J42 Unspecified chronic bronchitis: Secondary | ICD-10-CM

## 2019-10-16 DIAGNOSIS — R0789 Other chest pain: Secondary | ICD-10-CM

## 2019-10-16 MED ORDER — BREO ELLIPTA 100-25 MCG/INH IN AEPB: INHALATION_SPRAY | RESPIRATORY_TRACT | 0 refills | Status: DC

## 2019-10-16 NOTE — Addendum Note (Signed)
Addended by: Antonietta Barcelona D on: 10/16/2019 02:54 PM   Modules accepted: Orders

## 2019-10-17 ENCOUNTER — Encounter: Payer: Self-pay | Admitting: Family

## 2019-10-17 ENCOUNTER — Other Ambulatory Visit: Payer: Self-pay

## 2019-10-17 ENCOUNTER — Ambulatory Visit (INDEPENDENT_AMBULATORY_CARE_PROVIDER_SITE_OTHER): Payer: Medicare Other | Admitting: Family

## 2019-10-17 VITALS — BP 133/60 | HR 72 | Temp 98.2°F | Ht 66.5 in | Wt 246.2 lb

## 2019-10-17 DIAGNOSIS — R531 Weakness: Secondary | ICD-10-CM

## 2019-10-17 DIAGNOSIS — I6523 Occlusion and stenosis of bilateral carotid arteries: Secondary | ICD-10-CM

## 2019-10-17 DIAGNOSIS — R0789 Other chest pain: Secondary | ICD-10-CM

## 2019-10-17 DIAGNOSIS — I480 Paroxysmal atrial fibrillation: Secondary | ICD-10-CM | POA: Diagnosis not present

## 2019-10-17 DIAGNOSIS — I10 Essential (primary) hypertension: Secondary | ICD-10-CM

## 2019-10-17 DIAGNOSIS — I350 Nonrheumatic aortic (valve) stenosis: Secondary | ICD-10-CM | POA: Diagnosis not present

## 2019-10-17 DIAGNOSIS — J449 Chronic obstructive pulmonary disease, unspecified: Secondary | ICD-10-CM | POA: Diagnosis not present

## 2019-10-17 DIAGNOSIS — E1159 Type 2 diabetes mellitus with other circulatory complications: Secondary | ICD-10-CM

## 2019-10-17 DIAGNOSIS — E785 Hyperlipidemia, unspecified: Secondary | ICD-10-CM

## 2019-10-17 DIAGNOSIS — R42 Dizziness and giddiness: Secondary | ICD-10-CM

## 2019-10-17 DIAGNOSIS — I152 Hypertension secondary to endocrine disorders: Secondary | ICD-10-CM

## 2019-10-17 DIAGNOSIS — E1165 Type 2 diabetes mellitus with hyperglycemia: Secondary | ICD-10-CM

## 2019-10-17 DIAGNOSIS — E038 Other specified hypothyroidism: Secondary | ICD-10-CM

## 2019-10-17 DIAGNOSIS — E1169 Type 2 diabetes mellitus with other specified complication: Secondary | ICD-10-CM

## 2019-10-17 DIAGNOSIS — Z1212 Encounter for screening for malignant neoplasm of rectum: Secondary | ICD-10-CM

## 2019-10-17 DIAGNOSIS — Z1211 Encounter for screening for malignant neoplasm of colon: Secondary | ICD-10-CM

## 2019-10-17 LAB — BAYER DCA HB A1C WAIVED: HB A1C (BAYER DCA - WAIVED): 7 % — ABNORMAL HIGH (ref <7.0)

## 2019-10-17 MED ORDER — NITROGLYCERIN 0.4 MG SL SUBL: SUBLINGUAL_TABLET | SUBLINGUAL | 0 refills | Status: DC

## 2019-10-17 NOTE — Patient Instructions (Signed)
Carotid Artery Disease  Carotid artery disease, also called carotid artery stenosis, is the narrowing or blockage of one or both carotid arteries. The carotid arteries are the two main blood vessels on either side of the neck. They supply blood to the brain, other parts of the head, and the neck. Carotid artery disease increases your risk for a stroke or a transient ischemic attack (TIA). A TIA is a "mini-stroke" that causes stroke-like symptoms that then go away quickly. What are the causes? This condition is mainly caused by a narrowing and hardening of the carotid arteries (atherosclerosis). The carotid arteries can become narrow or clogged with a buildup of fat, cholesterol, calcium, and other substances (plaque). What increases the risk? The following factors may make you more likely to develop this condition:  Having certain medical conditions, such as: ? High cholesterol. ? High blood pressure (hypertension). ? Diabetes. ? Obesity.  Smoking.  A family history of cardiovascular disease.  Inactivity or lack of regular exercise.  Being male. Men have an increased risk of developing atherosclerosis earlier in life than women.  Old age. What are the signs or symptoms? This condition may not have any signs or symptoms until a stroke or TIA occurs. In some cases, your health care provider may be able to hear a whooshing sound (bruit). This can indicate a change in blood flow caused by plaque buildup. An eye exam can also help identify signs of the condition. How is this diagnosed? This condition may be diagnosed with a physical exam, your medical history, and your family's medical history. You may also have tests that look at the blood flow in your carotid arteries, such as:  Carotid artery ultrasound, which uses sound waves to create pictures to show if the arteries are narrow or blocked.  Tests that use a dye injected into a vein to highlight your arteries on images, such  as: ? Carotid or cerebral angiography, which uses X-rays. ? Computerized tomographic angiography (CTA), which uses CT scans. ? Magnetic resonance angiography (MRA), which uses MRI. How is this treated? This condition may be treated with a combination of treatments. Treatment options include:  Lifestyle changes, such as: ? Quitting smoking. ? Exercising regularly or as told by your health care provider. ? Eating a heart-healthy diet. ? Managing stress. ? Maintaining a healthy weight.  Medicines to control blood pressure, cholesterol, and blood clotting.  Surgery. You may have: ? A carotid endarterectomy. This is a surgery to remove the blockages in the carotid arteries. ? A carotid angioplasty with stenting. This is a procedure in which a small mesh tube (stent) is used to widen the blocked carotid arteries. Follow these instructions at home: Eating and drinking Follow instructions about your diet from your health care provider. It is important to:  Eat a healthy diet that is low in saturated fats and includes plenty of fresh fruits, vegetables, and lean meats.  Avoid foods that are high in fat and salt (sodium).  Avoid foods that are fried, overly processed, or have poor nutritional value.  Lifestyle   Maintain a healthy weight.  Do exercises as told by your health care provider to stay physically active. It is recommended that each week you get at least 150 minutes of moderate-intensity exercise or 75 minutes of exercise that takes a lot of effort.  Do not use any products that contain nicotine or tobacco, such as cigarettes, e-cigarettes, and chewing tobacco. If you need help quitting, ask your health care provider.    Do not drink alcohol if: ? Your health care provider tells you not to drink. ? You are pregnant, may be pregnant, or are planning to become pregnant.  If you drink alcohol: ? Limit how much you use to:  0-1 drink a day for women.  0-2 drinks a day for  men. ? Be aware of how much alcohol is in your drink. In the [place] one drink equals one 12 oz bottle of beer (355 mL), one 5 oz glass of wine (148 mL), or one 1 oz glass of hard liquor (44 mL).  Do not use drugs.  Manage your stress. Ask your health care provider for stress management tips. General instructions  Take over-the-counter and prescription medicines only as told by your health care provider.  Keep all follow-up visits as told by your health care provider. This is important. Where to find more information  American Heart Association: www.heart.org Get help right away if:  You have any symptoms of a stroke. "BE FAST" is an easy way to remember the main warning signs of a stroke: ? B - Balance. Signs are dizziness, sudden trouble walking, or loss of balance. ? E - Eyes. Signs are trouble seeing or a sudden change in vision. ? F - Face. Signs are sudden weakness or numbness of the face, or the face or eyelid drooping on one side. ? A - Arms. Signs are weakness or numbness in an arm. This happens suddenly and usually on one side of the body. ? S - Speech. Signs are sudden trouble speaking, slurred speech, or trouble understanding what people say. ? T - Time. Time to call emergency services. Write down what time symptoms started.  You have other signs of a stroke, such as: ? A sudden, severe headache with no known cause. ? Nausea or vomiting. ? Seizure. These symptoms may represent a serious problem that is an emergency. Do not wait to see if the symptoms will go away. Get medical help right away. Call your local emergency services (911 in the [place] Do not drive yourself to the hospital. Summary  Carotid artery disease, also called carotid artery stenosis, is the narrowing or blockage of one or both carotid arteries.  Carotid artery disease increases your risk for a stroke or a transient ischemic attack (TIA).  This condition can be treated with lifestyle changes, medicines,  surgery, or a combination of these treatments.  Get help right away if you have any symptoms of stroke. The acronym BEFAST is an easy way to remember the main warning signs of stroke. This information is not intended to replace advice given to you by your health care provider. Make sure you discuss any questions you have with your health care provider. Document Revised: 12/02/2018 Document Reviewed: 12/02/2018 Elsevier Patient Education  2020 Elsevier Inc.  

## 2019-10-17 NOTE — Progress Notes (Signed)
Subjective:    Patient ID: Matthew May, male    DOB: [birth year], 74 y.o.   MRN: [id number]  Chief Complaint  Patient presents with  . Medical Management of Chronic Issues   Ptcallsthe office todaychronic follow up. Pt is followed by Cardiologistsevery 4 months for CAD, aortic stenosis, and A Fib. These are stable at this time.  Pt got his both COVID vaccine. Diabetes He presents for his follow-up diabetic visit. He has type 2 diabetes mellitus. His disease course has been stable. Associated symptoms include blurred vision and fatigue. Pertinent negatives for diabetes include no foot paresthesias. Symptoms are stable. Diabetic complications include heart disease and nephropathy. Pertinent negatives for diabetic complications include no CVA. Risk factors for coronary artery disease include dyslipidemia, family history, male sex, hypertension and sedentary lifestyle. He is following a generally unhealthy diet. His overall blood glucose range is 130-140 mg/dl. Eye exam is not current.  Thyroid Problem Presents for follow-up visit. Symptoms include diarrhea and fatigue. Patient reports no constipation or dry skin. The symptoms have been stable. His past medical history is significant for hyperlipidemia.  Hyperlipidemia This is a chronic problem. The current episode started more than 1 year ago. The problem is controlled. Recent lipid tests were reviewed and are normal. Exacerbating diseases include obesity. Pertinent negatives include no shortness of breath. Current antihyperlipidemic treatment includes statins. The current treatment provides moderate improvement of lipids. Risk factors for coronary artery disease include dyslipidemia, diabetes mellitus, male sex, hypertension and a sedentary lifestyle.  Hypertension This is a chronic problem. The current episode started more than 1 year ago. The problem has been resolved since onset. The problem is controlled. Associated symptoms include  blurred vision, malaise/fatigue and peripheral edema. Pertinent negatives include no shortness of breath. Risk factors for coronary artery disease include dyslipidemia, diabetes mellitus, obesity and male gender. The current treatment provides moderate improvement. There is no history of CVA. Identifiable causes of hypertension include a thyroid problem.  COPD Stable, Using Breo daily.     Review of Systems  Constitutional: Positive for fatigue and malaise/fatigue.  Eyes: Positive for blurred vision.  Respiratory: Negative for shortness of breath.   Gastrointestinal: Positive for diarrhea. Negative for constipation.  All other systems reviewed and are negative.      Objective:   Physical Exam Vitals reviewed.  Constitutional:      General: He is not in acute distress.    Appearance: He is well-developed. He is obese.  HENT:     Head: Normocephalic.     Right Ear: Tympanic membrane normal.     Left Ear: Tympanic membrane normal.  Eyes:     General:        Right eye: No discharge.        Left eye: No discharge.     Pupils: Pupils are equal, round, and reactive to light.  Neck:     Thyroid: No thyromegaly.  Cardiovascular:     Rate and Rhythm: Normal rate and regular rhythm.     Heart sounds: Normal heart sounds. No murmur.  Pulmonary:     Effort: Pulmonary effort is normal. No respiratory distress.     Breath sounds: Normal breath sounds. No wheezing.  Abdominal:     General: Bowel sounds are normal. There is no distension.     Palpations: Abdomen is soft.     Tenderness: There is no abdominal tenderness.  Musculoskeletal:        General: No tenderness. Normal range of motion.  Cervical back: Normal range of motion and neck supple.  Skin:    General: Skin is warm and dry.     Findings: No erythema or rash.  Neurological:     Mental Status: He is alert and oriented to person, place, and time.     Cranial Nerves: No cranial nerve deficit.     Deep Tendon Reflexes:  Reflexes are normal and symmetric.  Psychiatric:        Behavior: Behavior normal.        Thought Content: Thought content normal.        Judgment: Judgment normal.       BP 133/60   Pulse 72   Temp 98.2 F (36.8 C) (Temporal)   Ht 5' 6.5" (1.689 m)   Wt 246 lb 3.2 oz (111.7 kg)   SpO2 95%   BMI 39.14 kg/m      Assessment & Plan:  Matthew May comes in today with chief complaint of Medical Management of Chronic Issues   Diagnosis and orders addressed:  1. Nonrheumatic aortic valve stenosis - CMP14+EGFR - CBC with Differential/Platelet  2. Bilateral carotid artery stenosis  - CMP14+EGFR - CBC with Differential/Platelet  3. Hypertension associated with diabetes ([place]) - CMP14+EGFR - CBC with Differential/Platelet  4. Paroxysmal atrial fibrillation (H[facility]) - CMP14+EGFR - CBC with Differential/Platelet  5. Chronic obstructive pulmonary disease, unspecified COPD type ([place]) - CMP14+EGFR - CBC with Differential/Platelet  6. Type 2 diabetes mellitus with hyperglycemia, without long-term current use of insulin ([facility]) - Bayer DCA Hb A1c Waived - CMP14+EGFR - CBC with Differential/Platelet  7. Hyperlipidemia associated with type 2 diabetes mellitus ([place] - CMP14+EGFR - CBC with Differential/Platelet - Lipid panel  8. Other specified hypothyroidism - CMP14+EGFR - CBC with Differential/Platelet - TSH  9. Morbid obesity ([place]) - CMP14+EGFR - CBC with Differential/Platelet  10. Colon cancer screening - Cologuard - CMP14+EGFR - CBC with Differential/Platelet  11. Screening for malignant neoplasm of the rectum - Cologuard - CMP14+EGFR - CBC with Differential/Platelet  12. Atypical chest pain - nitroGLYCERIN (NITROSTAT) 0.4 MG SL tablet; DISSOLVE ONE TABLET UNDER THE TONGUE EVERY 5 MINUTES AS NEEDED FOR CHEST PAIN.  DO NOT EXCEED A TOTAL OF 3 DOSES IN 15 MINUTES  Dispense: 25 tablet; Refill: 0   Labs pending Health Maintenance reviewed Diet and exercise  encouraged  Follow up plan: 3 months    Evelina Dun, FNP

## 2019-10-18 LAB — LIPID PANEL
Chol/HDL Ratio: 3.5 ratio (ref 0.0–5.0)
Cholesterol, Total: 110 mg/dL (ref 100–199)
HDL: 31 mg/dL — ABNORMAL LOW (ref >39)
LDL Chol Calc (NIH): 55 mg/dL (ref 0–99)
Triglycerides: 138 mg/dL (ref 0–149)
VLDL Cholesterol Cal: 24 mg/dL (ref 5–40)

## 2019-10-18 LAB — CBC WITH DIFFERENTIAL/PLATELET
Basophils Absolute: 0.1 10*3/uL (ref 0.0–0.2)
Basos: 1 %
EOS (ABSOLUTE): 0.2 10*3/uL (ref 0.0–0.4)
Eos: 2 %
Hematocrit: 38.5 % (ref 37.5–51.0)
Hemoglobin: 13.3 g/dL (ref 13.0–17.7)
Immature Grans (Abs): 0.1 10*3/uL (ref 0.0–0.1)
Immature Granulocytes: 1 %
Lymphocytes Absolute: 1.6 10*3/uL (ref 0.7–3.1)
Lymphs: 20 %
MCH: 29.6 pg (ref 26.6–33.0)
MCHC: 34.5 g/dL (ref 31.5–35.7)
MCV: 86 fL (ref 79–97)
Monocytes Absolute: 0.4 10*3/uL (ref 0.1–0.9)
Monocytes: 5 %
Neutrophils Absolute: 5.7 10*3/uL (ref 1.4–7.0)
Neutrophils: 71 %
Platelets: 161 10*3/uL (ref 150–450)
RBC: 4.5 x10E6/uL (ref 4.14–5.80)
RDW: 12.8 % (ref 11.6–15.4)
WBC: 8.1 10*3/uL (ref 3.4–10.8)

## 2019-10-18 LAB — CMP14+EGFR
ALT: 36 IU/L (ref 0–44)
AST: 23 IU/L (ref 0–40)
Albumin/Globulin Ratio: 1.6 (ref 1.2–2.2)
Albumin: 4.2 g/dL (ref 3.7–4.7)
Alkaline Phosphatase: 73 IU/L (ref 39–117)
BUN/Creatinine Ratio: 15 (ref 10–24)
BUN: 12 mg/dL (ref 8–27)
Bilirubin Total: 0.3 mg/dL (ref 0.0–1.2)
CO2: 22 mmol/L (ref 20–29)
Calcium: 9.2 mg/dL (ref 8.6–10.2)
Chloride: 99 mmol/L (ref 96–106)
Creatinine, Ser: 0.82 mg/dL (ref 0.76–1.27)
GFR calc Af Amer: 102 mL/min/{1.73_m2} (ref >59)
GFR calc non Af Amer: 88 mL/min/{1.73_m2} (ref >59)
Globulin, Total: 2.6 g/dL (ref 1.5–4.5)
Glucose: 159 mg/dL — ABNORMAL HIGH (ref 65–99)
Potassium: 4.1 mmol/L (ref 3.5–5.2)
Sodium: 136 mmol/L (ref 134–144)
Total Protein: 6.8 g/dL (ref 6.0–8.5)

## 2019-10-18 LAB — TSH: TSH: 1.92 u[IU]/mL (ref 0.450–4.500)

## 2019-10-20 NOTE — Addendum Note (Signed)
Addended by: Evelina Dun A on: 10/20/2019 09:46 AM   Modules accepted: Orders

## 2019-10-27 DIAGNOSIS — Z1211 Encounter for screening for malignant neoplasm of colon: Secondary | ICD-10-CM | POA: Diagnosis not present

## 2019-10-31 LAB — EXTERNAL GENERIC LAB PROCEDURE: COLOGUARD: NEGATIVE

## 2019-11-07 ENCOUNTER — Encounter: Payer: Self-pay | Admitting: *Deleted

## 2019-11-15 ENCOUNTER — Other Ambulatory Visit: Payer: Self-pay | Admitting: Family

## 2019-11-19 ENCOUNTER — Other Ambulatory Visit: Payer: Self-pay | Admitting: *Deleted

## 2019-11-19 MED ORDER — APIXABAN 5 MG PO TABS: 5.0000 mg | ORAL_TABLET | Freq: Two times a day (BID) | ORAL | 1 refills | Status: DC

## 2019-11-20 LAB — COLOGUARD: Cologuard: NEGATIVE

## 2019-11-25 ENCOUNTER — Other Ambulatory Visit: Payer: Self-pay

## 2019-11-25 ENCOUNTER — Encounter (HOSPITAL_COMMUNITY): Payer: Self-pay | Admitting: *Deleted

## 2019-11-25 DIAGNOSIS — Z5321 Procedure and treatment not carried out due to patient leaving prior to being seen by health care provider: Secondary | ICD-10-CM | POA: Diagnosis not present

## 2019-11-25 DIAGNOSIS — R2242 Localized swelling, mass and lump, left lower limb: Secondary | ICD-10-CM | POA: Insufficient documentation

## 2019-11-25 LAB — CBC WITH DIFFERENTIAL/PLATELET
Abs Immature Granulocytes: 0.08 10*3/uL — ABNORMAL HIGH (ref 0.00–0.07)
Basophils Absolute: 0.1 10*3/uL (ref 0.0–0.1)
Basophils Relative: 1 %
Eosinophils Absolute: 0.2 10*3/uL (ref 0.0–0.5)
Eosinophils Relative: 3 %
HCT: 41 % (ref 39.0–52.0)
Hemoglobin: 13.3 g/dL (ref 13.0–17.0)
Immature Granulocytes: 1 %
Lymphocytes Relative: 19 %
Lymphs Abs: 1.6 10*3/uL (ref 0.7–4.0)
MCH: 29.2 pg (ref 26.0–34.0)
MCHC: 32.4 g/dL (ref 30.0–36.0)
MCV: 89.9 fL (ref 80.0–100.0)
Monocytes Absolute: 0.5 10*3/uL (ref 0.1–1.0)
Monocytes Relative: 6 %
Neutro Abs: 6.1 10*3/uL (ref 1.7–7.7)
Neutrophils Relative %: 70 %
Platelets: 164 10*3/uL (ref 150–400)
RBC: 4.56 MIL/uL (ref 4.22–5.81)
RDW: 13.1 % (ref 11.5–15.5)
WBC: 8.5 10*3/uL (ref 4.0–10.5)
nRBC: 0 % (ref 0.0–0.2)

## 2019-11-25 LAB — BASIC METABOLIC PANEL
Anion gap: 13 (ref 5–15)
BUN: 13 mg/dL (ref 8–23)
CO2: 26 mmol/L (ref 22–32)
Calcium: 8.8 mg/dL — ABNORMAL LOW (ref 8.9–10.3)
Chloride: 96 mmol/L — ABNORMAL LOW (ref 98–111)
Creatinine, Ser: 0.94 mg/dL (ref 0.61–1.24)
GFR calc Af Amer: >60 mL/min (ref >60)
GFR calc non Af Amer: >60 mL/min (ref >60)
Glucose, Bld: 212 mg/dL — ABNORMAL HIGH (ref 70–99)
Potassium: 3.6 mmol/L (ref 3.5–5.1)
Sodium: 135 mmol/L (ref 135–145)

## 2019-11-25 NOTE — ED Triage Notes (Signed)
Pt states swelling to left lower leg for years but past few days has gotten worse and now with redness.

## 2019-11-26 ENCOUNTER — Emergency Department (HOSPITAL_COMMUNITY)
Admission: EM | Admit: 2019-11-26 | Discharge: 2019-11-26 | Disposition: A | Discharge status: Home / Self Care | Payer: Medicare Other | Attending: Emergency Medicine | Admitting: Emergency Medicine

## 2019-11-26 ENCOUNTER — Ambulatory Visit (INDEPENDENT_AMBULATORY_CARE_PROVIDER_SITE_OTHER): Payer: Medicare Other | Admitting: Family Medicine

## 2019-11-26 ENCOUNTER — Encounter: Payer: Self-pay | Admitting: Family Medicine

## 2019-11-26 ENCOUNTER — Other Ambulatory Visit: Payer: Self-pay

## 2019-11-26 VITALS — BP 100/68 | HR 66 | Temp 97.6°F | Ht 67.5 in | Wt 243.0 lb

## 2019-11-26 DIAGNOSIS — L03116 Cellulitis of left lower limb: Secondary | ICD-10-CM | POA: Diagnosis not present

## 2019-11-26 MED ORDER — SULFAMETHOXAZOLE-TRIMETHOPRIM 800-160 MG PO TABS: 1.0000 | ORAL_TABLET | Freq: Two times a day (BID) | ORAL | 0 refills | Status: AC

## 2019-11-26 NOTE — Patient Instructions (Signed)

## 2019-11-26 NOTE — Progress Notes (Signed)
Assessment & Plan:  1. Cellulitis of left lower extremity - Education provided on cellulitis.  - sulfamethoxazole-trimethoprim (BACTRIM DS) 800-160 MG tablet; Take 1 tablet by mouth 2 (two) times daily for 10 days.  Dispense: 20 tablet; Refill: 0   Return if symptoms worsen or fail to improve.  Hendricks Limes, MSN, APRN, FNP-C [facility]  Subjective:    Patient ID: Matthew May, male    DOB: 1945-09-22, 74 y.o.   MRN: [id number]  Patient Care Team: Sharion Balloon, FNP as PCP - General (Nurse Practitioner) Minus Breeding, MD as PCP - Cardiology (Cardiology) Minus Breeding, MD as Consulting Physician (Cardiology)   Chief Complaint:  Chief Complaint  Patient presents with   Leg Swelling    Patient states he has ongoing left lower leg swelling that has been going on but has gotten worse past few days.    HPI: Matthew May is a 74 y.o. male presenting on 11/26/2019 for Leg Swelling (Patient states he has ongoing left lower leg swelling that has been going on but has gotten worse past few days.)  Patient reports he has chronic swelling in his lower extremities with mild erythema but in the past few days his left leg swelling and erythema has worsened. It also has warmth. He wife who has accompanied him reports he was hospitalized for IV antibiotics a couple of years ago due to cellulitis and she was afraid that would happen again. He did go to the ER last night but did not stay to be seen after he had been there for 5 hours and they told him it would be 2 more hours.    Social history:  Relevant past medical, surgical, family and social history reviewed and updated as indicated. Interim medical history since our last visit reviewed.  Allergies and medications reviewed and updated.  DATA REVIEWED: CHART IN EPIC  ROS: Negative unless specifically indicated above in HPI.    Current Outpatient Medications:    albuterol (PROVENTIL HFA;VENTOLIN  HFA) 108 (90 Base) MCG/ACT inhaler, Inhale 2 puffs into the lungs every 6 (six) hours as needed for wheezing or shortness of breath., Disp: 1 Inhaler, Rfl: 0   apixaban (ELIQUIS) 5 MG TABS tablet, Take 1 tablet (5 mg total) by mouth 2 (two) times daily., Disp: 60 tablet, Rfl: 1   atorvastatin (LIPITOR) 40 MG tablet, TAKE 1 TABLET BY MOUTH  DAILY, Disp: 90 tablet, Rfl: 3   cholecalciferol (VITAMIN D3) 25 MCG (1000 UT) tablet, Take 1,000 Units by mouth daily., Disp: , Rfl:    colchicine 0.6 MG tablet, Take 1 tablet (0.6 mg total) by mouth daily. 2 tablets now and then 1 tablet in one hour. Then one tablet daily until symptoms resolve., Disp: 30 tablet, Rfl: 0   fluticasone furoate-vilanterol (BREO ELLIPTA) 100-25 MCG/INH AEPB, Inhale 1 puff by mouth once daily, Disp: 60 each, Rfl: 0   glimepiride (AMARYL) 2 MG tablet, Take 1 tablet (2 mg total) by mouth daily with breakfast., Disp: 90 tablet, Rfl: 1   glucose blood (ONETOUCH VERIO) test strip, Test BS daily Dx E11.9, Disp: 100 strip, Rfl: 3   Lancets (ONETOUCH DELICA PLUS [id number]) MISC, 1 each by Other route daily. Dx E11.9, Disp: 100 each, Rfl: 3   levothyroxine (SYNTHROID) 150 MCG tablet, TAKE 1 TABLET BY MOUTH  DAILY, Disp: 90 tablet, Rfl: 3   metFORMIN (GLUCOPHAGE) 1000 MG tablet, Take 1 tablet (1,000 mg total) by mouth 2 (two) times daily., Disp: 60  tablet, Rfl: 0   Misc Natural Products (TART CHERRY ADVANCED PO), Take by mouth., Disp: , Rfl:    nitroGLYCERIN (NITROSTAT) 0.4 MG SL tablet, DISSOLVE ONE TABLET UNDER THE TONGUE EVERY 5 MINUTES AS NEEDED FOR CHEST PAIN.  DO NOT EXCEED A TOTAL OF 3 DOSES IN 15 MINUTES, Disp: 25 tablet, Rfl: 0   potassium chloride SA (KLOR-CON) 20 MEQ tablet, TAKE 1 TABLET BY MOUTH  DAILY, Disp: 90 tablet, Rfl: 3   sitaGLIPtin (JANUVIA) 100 MG tablet, Take 1 tablet (100 mg total) by mouth daily., Disp: 90 tablet, Rfl: 1   torsemide (DEMADEX) 20 MG tablet, Take 2 tablets (40 mg total) by mouth daily.,  Disp: 180 tablet, Rfl: 3   valsartan (DIOVAN) 40 MG tablet, TAKE ONE-HALF TABLET BY  MOUTH DAILY, Disp: 45 tablet, Rfl: 1   Allergies  Allergen Reactions   Prednisone Other (See Comments)    Pt states "sugar went to 580"   Jardiance [Empagliflozin] Rash   Lisinopril Cough   Past Medical History:  Diagnosis Date   Aortic stenosis    Atrial fibrillation (HCC)    CAD (coronary artery disease)    a. s/p NSTEMI 4/11 => s/p CABG (L-LAD, S-OM2, S-PDA/PL);  b.  ETT-Myoview 6/14:  normal study, no ischemia, EF 67%   Carotid stenosis    Carotid U/S 1/61:  RICA 0-96%, LICA 04-54%; right vertebral flow retrograde suggestive of steel-consider PV consult   COPD (chronic obstructive pulmonary disease) (HCC)    Diabetes mellitus without complication ([place])    HLD (hyperlipidemia)    HTN (hypertension)    Hx of echocardiogram    Echo 6/14: Mod LVH, focal basal hypertrophy, EF 50-55%, mild AS (mean 17 mmHg) and mild AI, mild to mod LAE, mild RAE   Obesity    Renal insufficiency    Subclavian artery stenosis, right ([place])    based upon carotid U/S done 11/2012    Past Surgical History:  Procedure Laterality Date   CORONARY ARTERY BYPASS GRAFT     2011, LIMA to LAD coronary artery, SVG to OM2 branch of lect circumflex coronary artery, and a sequential SVG to psot descening to posterolateral branches to RCA   endoscopic vein harvesting     right leg    HERNIA REPAIR     RIGHT/LEFT HEART CATH AND CORONARY/GRAFT ANGIOGRAPHY N/A 12/31/2017   Procedure: RIGHT/LEFT HEART CATH AND CORONARY/GRAFT ANGIOGRAPHY;  Surgeon: Leonie Man, MD;  Location: [facility];  Service: Cardiovascular;  Laterality: N/A;   TONSILLECTOMY      Social History   Socioeconomic History   Marital status: Married    Spouse name: Lucita Ferrara   Number of children: 3   Years of education: 8   Highest education level: 8th grade  Occupational History   Occupation: Retired    Comment: scrap yard    Tobacco Use   Smoking status: Former Smoker    Quit date: 07/11/2009    Years since quitting: 10.3   Smokeless tobacco: Never Used   Tobacco comment: smoked about 10 cig/day; used to smoke 2 ppd for many years   Vaping Use   Vaping Use: Never used  Substance and Sexual Activity   Alcohol use: No    Alcohol/week: 0.0 standard drinks   Drug use: No   Sexual activity: Never  Other Topics Concern   Not on file  Social History Narrative   Lives at home with his wife. He is retired but his wife continues to work  daily. They have adult children that live locally and grandchildren that they spend time with regularly. He is active around his home and yard.    Social Determinants of Health   Financial Resource Strain:    Difficulty of Paying Living Expenses:   Food Insecurity:    Worried About Charity fundraiser in the Last Year:    Arboriculturist in the Last Year:   Transportation Needs:    Film/video editor (Medical):    Lack of Transportation (Non-Medical):   Physical Activity:    Days of Exercise per Week:    Minutes of Exercise per Session:   Stress:    Feeling of Stress :   Social Connections:    Frequency of Communication with Friends and Family:    Frequency of Social Gatherings with Friends and Family:    Attends Religious Services:    Active Member of Clubs or Organizations:    Attends Archivist Meetings:    Marital Status:   Intimate Partner Violence:    Fear of Current or Ex-Partner:    Emotionally Abused:    Physically Abused:    Sexually Abused:         Objective:    BP 100/68    Pulse 66    Temp 97.6 F (36.4 C) (Temporal)    Ht 5' 7.5" (1.715 m)    Wt 243 lb (110.2 kg)    SpO2 97%    BMI 37.50 kg/m   Wt Readings from Last 3 Encounters:  11/26/19 243 lb (110.2 kg)  11/25/19 242 lb (109.8 kg)  10/17/19 246 lb 3.2 oz (111.7 kg)    Physical Exam Vitals reviewed.  Constitutional:      General: He is not in  acute distress.    Appearance: Normal appearance. He is obese. He is not ill-appearing, toxic-appearing or diaphoretic.  HENT:     Head: Normocephalic and atraumatic.  Eyes:     General: No scleral icterus.       Right eye: No discharge.        Left eye: No discharge.     Conjunctiva/sclera: Conjunctivae normal.  Cardiovascular:     Rate and Rhythm: Normal rate.  Pulmonary:     Effort: Pulmonary effort is normal. No respiratory distress.  Musculoskeletal:        General: Normal range of motion.     Cervical back: Normal range of motion.  Skin:    General: Skin is warm and dry.     Comments: Erythema, warmth, and swelling to LLE. There are two scabs in this area as well.   Neurological:     Mental Status: He is alert and oriented to person, place, and time. Mental status is at baseline.  Psychiatric:        Mood and Affect: Mood normal.        Behavior: Behavior normal.        Thought Content: Thought content normal.        Judgment: Judgment normal.     Lab Results  Component Value Date   TSH 1.920 10/17/2019   Lab Results  Component Value Date   WBC 8.5 11/25/2019   HGB 13.3 11/25/2019   HCT 41.0 11/25/2019   MCV 89.9 11/25/2019   PLT 164 11/25/2019   Lab Results  Component Value Date   NA 135 11/25/2019   K 3.6 11/25/2019   CO2 26 11/25/2019   GLUCOSE 212 (H) 11/25/2019   BUN  13 11/25/2019   CREATININE 0.94 11/25/2019   BILITOT 0.3 10/17/2019   ALKPHOS 73 10/17/2019   AST 23 10/17/2019   ALT 36 10/17/2019   PROT 6.8 10/17/2019   ALBUMIN 4.2 10/17/2019   CALCIUM 8.8 (L) 11/25/2019   ANIONGAP 13 11/25/2019   GFR 84.51 10/29/2012   Lab Results  Component Value Date   CHOL 110 10/17/2019   Lab Results  Component Value Date   HDL 31 (L) 10/17/2019   Lab Results  Component Value Date   LDLCALC 55 10/17/2019   Lab Results  Component Value Date   TRIG 138 10/17/2019   Lab Results  Component Value Date   CHOLHDL 3.5 10/17/2019   Lab Results    Component Value Date   HGBA1C 7.0 (H) 10/17/2019

## 2019-11-28 ENCOUNTER — Other Ambulatory Visit: Payer: Self-pay | Admitting: Family

## 2019-11-28 DIAGNOSIS — E119 Type 2 diabetes mellitus without complications: Secondary | ICD-10-CM

## 2019-12-14 NOTE — Progress Notes (Signed)
Cardiology Office Note   Date:  12/17/2019   ID:  Matthew May, DOB 1946-03-25, MRN [id number]  PCP:  Sharion Balloon, FNP  Cardiologist:   Minus Breeding, MD   Chief Complaint  Patient presents with   Shortness of Breath      History of Present Illness: Matthew May is a 74 y.o. male who presents for followup of his known coronary disease.   He was in the hospital in July 2019 with presyncope.  He had a cath with results as below.  Cath demonstrated moderate AS.   He was sent home and PFTs were obtained.  He did not have any significant findings consistent with lung disease.  He continues to having dizzy spells since his discharge in late July.   He has been found to have atrial fib.  He has been started on Eliquis.    He also had his Torsemide reduced..   He has AS that might be severe on echo.  I reviewed this.  He has a gradient has increased from 27 mmHg previously mean now gradient of 52.  He does have decreased leaflet excursion.  He has good LV function.  Talking to him it is really difficult to understand if he is more symptomatic.  His wife says that he is.  She says he has more spells of dizziness and increased dyspnea.  He says his dyspnea is somewhat sporadic.  He tilled a role in his garden recently and was able to do this without shortness of breath although he has to stop and rest.  I feel like he is probably more sedentary and more symptomatic than he reports.  He is not describing syncope but there is occasional dizziness.  He is not having any chest pressure, neck or arm discomfort.  He is not having any PND or orthopnea.  He has chronic lower extremity swelling.   Past Medical History:  Diagnosis Date   Aortic stenosis    Atrial fibrillation (HCC)    CAD (coronary artery disease)    a. s/p NSTEMI 4/11 => s/p CABG (L-LAD, S-OM2, S-PDA/PL);  b.  ETT-Myoview 6/14:  normal study, no ischemia, EF 67%   Carotid stenosis    Carotid U/S 2/44:  RICA 0-10%,  LICA 27-25%; right vertebral flow retrograde suggestive of steel-consider PV consult   COPD (chronic obstructive pulmonary disease) (HCC)    Diabetes mellitus without complication ([place])    HLD (hyperlipidemia)    HTN (hypertension)    Hx of echocardiogram    Echo 6/14: Mod LVH, focal basal hypertrophy, EF 50-55%, mild AS (mean 17 mmHg) and mild AI, mild to mod LAE, mild RAE   Obesity    Renal insufficiency    Subclavian artery stenosis, right ([place])    based upon carotid U/S done 11/2012    Past Surgical History:  Procedure Laterality Date   CORONARY ARTERY BYPASS GRAFT     2011, LIMA to LAD coronary artery, SVG to OM2 branch of lect circumflex coronary artery, and a sequential SVG to psot descening to posterolateral branches to RCA   endoscopic vein harvesting     right leg    HERNIA REPAIR     RIGHT/LEFT HEART CATH AND CORONARY/GRAFT ANGIOGRAPHY N/A 12/31/2017   Procedure: RIGHT/LEFT HEART CATH AND CORONARY/GRAFT ANGIOGRAPHY;  Surgeon: Leonie Man, MD;  Location: [facility];  Service: Cardiovascular;  Laterality: N/A;   TONSILLECTOMY       Current Outpatient Medications  Medication Sig Dispense Refill   albuterol (PROVENTIL HFA;VENTOLIN HFA) 108 (90 Base) MCG/ACT inhaler Inhale 2 puffs into the lungs every 6 (six) hours as needed for wheezing or shortness of breath. 1 Inhaler 0   apixaban (ELIQUIS) 5 MG TABS tablet Take 1 tablet (5 mg total) by mouth 2 (two) times daily. 60 tablet 1   atorvastatin (LIPITOR) 40 MG tablet TAKE 1 TABLET BY MOUTH  DAILY 90 tablet 3   cholecalciferol (VITAMIN D3) 25 MCG (1000 UT) tablet Take 1,000 Units by mouth daily.     colchicine 0.6 MG tablet Take 1 tablet (0.6 mg total) by mouth daily. 2 tablets now and then 1 tablet in one hour. Then one tablet daily until symptoms resolve. 30 tablet 0   fluticasone furoate-vilanterol (BREO ELLIPTA) 100-25 MCG/INH AEPB Inhale 1 puff by mouth once daily 60 each 0   glimepiride  (AMARYL) 2 MG tablet Take 1 tablet (2 mg total) by mouth daily with breakfast. 90 tablet 1   levothyroxine (SYNTHROID) 150 MCG tablet TAKE 1 TABLET BY MOUTH  DAILY 90 tablet 3   metFORMIN (GLUCOPHAGE) 1000 MG tablet Take 1 tablet (1,000 mg total) by mouth 2 (two) times daily. 60 tablet 0   Misc Natural Products (TART CHERRY ADVANCED PO) Take by mouth.     nitroGLYCERIN (NITROSTAT) 0.4 MG SL tablet DISSOLVE ONE TABLET UNDER THE TONGUE EVERY 5 MINUTES AS NEEDED FOR CHEST PAIN.  DO NOT EXCEED A TOTAL OF 3 DOSES IN 15 MINUTES 25 tablet 0   potassium chloride SA (KLOR-CON) 20 MEQ tablet TAKE 1 TABLET BY MOUTH  DAILY 90 tablet 3   torsemide (DEMADEX) 20 MG tablet Take 2 tablets (40 mg total) by mouth daily. 180 tablet 3   valsartan (DIOVAN) 40 MG tablet TAKE ONE-HALF TABLET BY  MOUTH DAILY 45 tablet 1   glucose blood (ONETOUCH VERIO) test strip Test BS daily Dx E11.9 100 strip 3   Lancets (ONETOUCH DELICA PLUS [id number]) MISC USE DAILY 100 each 3   No current facility-administered medications for this visit.    Allergies:   Prednisone, Januvia [sitagliptin], Jardiance [empagliflozin], and Lisinopril    ROS:  Please see the history of present illness.   Otherwise, review of systems are positive for none.   All other systems are reviewed and negative.    PHYSICAL EXAM: VS:  BP (!) 170/80    Pulse 78    Ht 5' 7.5" (1.715 m)    Wt 247 lb (112 kg)    BMI 38.11 kg/m  , BMI Body mass index is 38.11 kg/m. GENERAL:  Well appearing NECK:  No jugular venous distention, waveform within normal limits, carotid upstroke brisk and symmetric, no bruits, no thyromegaly LUNGS: Few diffuse wheezes CHEST: Well-healed sternotomy scar HEART:  PMI not displaced or sustained,S1 and S2 within normal limits, no S3, no S4, no clicks, no rubs, 3 out of 6 apical systolic murmur radiating at the aortic outflow tract and mid to late peaking, no diastolic murmurs ABD:  Flat, positive bowel sounds normal in frequency  in pitch, no bruits, no rebound, no guarding, no midline pulsatile mass, no hepatomegaly, no splenomegaly EXT:  2 plus pulses throughout, moderate leg edema, no cyanosis no clubbing   EKG:  EKG is  ordered today. The ekg ordered today demonstrates normal sinus rhythm, premature ectopic contractions, lateral T wave changes possible ischemia but unchanged from previous.  PVCs   Recent Labs: 10/17/2019: ALT 36; TSH 1.920 11/25/2019: BUN 13; Creatinine, Ser  0.94; Hemoglobin 13.3; Platelets 164; Potassium 3.6; Sodium 135    Lipid Panel    Component Value Date/Time   CHOL 110 10/17/2019 0855   CHOL 138 12/26/2012 1310   TRIG 138 10/17/2019 0855   TRIG 90 05/23/2013 1452   TRIG 108 12/26/2012 1310   HDL 31 (L) 10/17/2019 0855   HDL 34 (L) 05/23/2013 1452   HDL 33 (L) 12/26/2012 1310   CHOLHDL 3.5 10/17/2019 0855   CHOLHDL 5.9 10/01/2009 0700   VLDL 16 10/01/2009 0700   LDLCALC 55 10/17/2019 0855   LDLCALC 81 05/23/2013 1452   LDLCALC 83 12/26/2012 1310      Wt Readings from Last 3 Encounters:  12/17/19 247 lb (112 kg)  11/26/19 243 lb (110.2 kg)  11/25/19 242 lb (109.8 kg)      Other studies Reviewed: Additional studies/ records that were reviewed today include: Echo images reviewed. Review of the above records demonstrates:  Please see elsewhere in the note.     ASSESSMENT AND PLAN:  ATRIAL FIB:   Mr. Domenik Trice Morriss has a CHA2DS2 - VASc score of 3.  No change in therapy.  He seems to be maintaining sinus.  CORONARY ATHEROSCLEROSIS NATIVE CORONARY ARTERY - He has had one occluded graft to an occluded circumflex in 2019.   I do not strongly suspect an anginal equivalent for his vague symptoms but he will have his coronaries assessed as part of his valve work-up.   HYPERTENSION -  The blood pressure is elevated.   However, this is very unusual.  He is typically normal to low.  Of asked him to keep a blood pressure diary.  I am not going to change the meds based on this 1  reading but will titrate medications based on further data.    DYSLIPIDEMIA -   LDL was 55.  No change in therapy.  AS - This was severe on last echo but moderate on cath prior to this.  I suspect that he has had worsening aortic stenosis.  I think he is more symptomatic and has indication for valve replacement.   I will send him to the structural heart clinic to consider TAVR.  It would certainly be high risk for redo surgery for SAVR.Marland Kitchen  However, I do not think this risk would be prohibitive.   CAROTID STENOSIS - He had right less than 69% stenosis and left no significant plaque in April of this year.  We will follow this up next year.  OBESITY - He understands the need to lose weight.  We talked about this for quite a while.  DM - His last A1c was 7.0.  No change in therapy.   COVID EDUCATION - He has had both his vaccines.  Current medicines are reviewed at length with the patient today.  The patient does not have concerns regarding medicines.  The following changes have been made:  no change  Labs/ tests ordered today include:   Orders Placed This Encounter  Procedures   EKG 12-Lead     Disposition:   FU with me after the valve evaluation.Ronnell Guadalajara, MD  12/17/2019 12:46 PM    [place]

## 2019-12-17 ENCOUNTER — Other Ambulatory Visit: Payer: Self-pay

## 2019-12-17 ENCOUNTER — Encounter: Payer: Self-pay | Admitting: Cardiology

## 2019-12-17 ENCOUNTER — Ambulatory Visit (INDEPENDENT_AMBULATORY_CARE_PROVIDER_SITE_OTHER): Payer: Medicare Other | Admitting: Cardiology

## 2019-12-17 VITALS — BP 170/80 | HR 78 | Ht 67.5 in | Wt 247.0 lb

## 2019-12-17 DIAGNOSIS — E785 Hyperlipidemia, unspecified: Secondary | ICD-10-CM | POA: Diagnosis not present

## 2019-12-17 DIAGNOSIS — I350 Nonrheumatic aortic (valve) stenosis: Secondary | ICD-10-CM | POA: Diagnosis not present

## 2019-12-17 DIAGNOSIS — I10 Essential (primary) hypertension: Secondary | ICD-10-CM | POA: Diagnosis not present

## 2019-12-17 DIAGNOSIS — I480 Paroxysmal atrial fibrillation: Secondary | ICD-10-CM | POA: Diagnosis not present

## 2019-12-17 DIAGNOSIS — I2510 Atherosclerotic heart disease of native coronary artery without angina pectoris: Secondary | ICD-10-CM

## 2019-12-17 LAB — HM DIABETES EYE EXAM

## 2019-12-17 NOTE — Patient Instructions (Signed)
Medication Instructions:  The current medical regimen is effective;  continue present plan and medications.  *If you need a refill on your cardiac medications before your next appointment, please call your pharmacy*  Follow-Up: At [facility], you and your health needs are our priority.  As part of our continuing mission to provide you with exceptional heart care, we have created designated Provider Care Teams.  These Care Teams include your primary Cardiologist (physician) and Advanced Practice Providers (APPs -  Physician Assistants and Nurse Practitioners) who all work together to provide you with the care you need, when you need it.  We recommend signing up for the patient portal called "MyChart".  Sign up information is provided on this After Visit Summary.  MyChart is used to connect with patients for Virtual Visits (Telemedicine).  Patients are able to view lab/test results, encounter notes, upcoming appointments, etc.  Non-urgent messages can be sent to your provider as well.   To learn more about what you can do with MyChart, go to [email or web address].    Follow up with Dr Percival Spanish will be determined at a later date.  You have been referred to see either Dr Burt Knack or Dr Angelena Form regarding your Aortic Stenosis.  Thank you for choosing [facility]!!

## 2019-12-23 ENCOUNTER — Ambulatory Visit: Payer: Medicare Other | Admitting: Neurology

## 2019-12-25 ENCOUNTER — Other Ambulatory Visit: Payer: Self-pay

## 2019-12-25 ENCOUNTER — Encounter: Payer: Self-pay | Admitting: Cardiovascular Disease

## 2019-12-25 ENCOUNTER — Ambulatory Visit: Payer: Medicare Other | Admitting: Cardiovascular Disease

## 2019-12-25 VITALS — BP 148/68 | HR 78 | Ht 67.5 in | Wt 248.0 lb

## 2019-12-25 DIAGNOSIS — I350 Nonrheumatic aortic (valve) stenosis: Secondary | ICD-10-CM | POA: Diagnosis not present

## 2019-12-25 DIAGNOSIS — I25119 Atherosclerotic heart disease of native coronary artery with unspecified angina pectoris: Secondary | ICD-10-CM | POA: Diagnosis not present

## 2019-12-25 LAB — BASIC METABOLIC PANEL
BUN/Creatinine Ratio: 14 (ref 10–24)
BUN: 13 mg/dL (ref 8–27)
CO2: 24 mmol/L (ref 20–29)
Calcium: 9.2 mg/dL (ref 8.6–10.2)
Chloride: 98 mmol/L (ref 96–106)
Creatinine, Ser: 0.91 mg/dL (ref 0.76–1.27)
GFR calc Af Amer: 97 mL/min/{1.73_m2} (ref >59)
GFR calc non Af Amer: 84 mL/min/{1.73_m2} (ref >59)
Glucose: 117 mg/dL — ABNORMAL HIGH (ref 65–99)
Potassium: 4.1 mmol/L (ref 3.5–5.2)
Sodium: 138 mmol/L (ref 134–144)

## 2019-12-25 LAB — CBC WITH DIFFERENTIAL/PLATELET
Basophils Absolute: 0.1 10*3/uL (ref 0.0–0.2)
Basos: 1 %
EOS (ABSOLUTE): 0.2 10*3/uL (ref 0.0–0.4)
Eos: 2 %
Hematocrit: 37.3 % — ABNORMAL LOW (ref 37.5–51.0)
Hemoglobin: 12.6 g/dL — ABNORMAL LOW (ref 13.0–17.7)
Immature Grans (Abs): 0.1 10*3/uL (ref 0.0–0.1)
Immature Granulocytes: 1 %
Lymphocytes Absolute: 1.8 10*3/uL (ref 0.7–3.1)
Lymphs: 21 %
MCH: 29.4 pg (ref 26.6–33.0)
MCHC: 33.8 g/dL (ref 31.5–35.7)
MCV: 87 fL (ref 79–97)
Monocytes Absolute: 0.4 10*3/uL (ref 0.1–0.9)
Monocytes: 5 %
Neutrophils Absolute: 6 10*3/uL (ref 1.4–7.0)
Neutrophils: 70 %
Platelets: 148 10*3/uL — ABNORMAL LOW (ref 150–450)
RBC: 4.28 x10E6/uL (ref 4.14–5.80)
RDW: 13 % (ref 11.6–15.4)
WBC: 8.4 10*3/uL (ref 3.4–10.8)

## 2019-12-25 NOTE — Progress Notes (Signed)
Cardiology Office Note:    Date:  12/25/2019   ID:  Matthew May, DOB 1945-08-15, MRN [id number]  PCP:  Sharion Balloon, FNP  [facility] Cardiologist:  Minus Breeding, MD  [place] Electrophysiologist:  None   Referring MD: Sharion Balloon, FNP   Chief Complaint  Patient presents with  . Shortness of Breath    History of Present Illness:    Matthew May is a 74 y.o. male referred by Dr Percival Spanish for evaluation of severe aortic stenosis.   He has been followed for CAD since 2011 when he presented with NSTEMI and was found to have severe 3 vessel CAD. He was treated with multivessel CABG by Dr Cyndia Bent with a LIMA-LAD, SVG-OM, and sequential SVG-PDA and PLA. He has done well since his CABG until the past 12 months when he has developed exertional dyspnea and chest discomfort. He is now limited by shortness of breath with low level activity, such as walking short distances on level ground. He can't do much yard work anymore. He also has exertional chest pressure with low level activity that may last for several minutes after he stops and rests. He also experiences fatigue and exertional lightheadedness at times. He denies frank syncope. He was able to plant his garden in April but reports that he has worsened since then. Now he is sitting most of the time. He is not limited by arthritis or other problems.   The patient retired in 2011, and had his heart attack the following month. He has remained fairly active in retirement until this past year.   Past Medical History:  Diagnosis Date  . Aortic stenosis   . Atrial fibrillation ([place])   . CAD (coronary artery disease)    a. s/p NSTEMI 4/11 => s/p CABG (L-LAD, S-OM2, S-PDA/PL);  b.  ETT-Myoview 6/14:  normal study, no ischemia, EF 67%  . Carotid stenosis    Carotid U/S 1/75:  RICA 1-02%, LICA 58-52%; right vertebral flow retrograde suggestive of steel-consider PV consult  . COPD (chronic obstructive pulmonary disease) ([place])    . Diabetes mellitus without complication ([place])   . HLD (hyperlipidemia)   . HTN (hypertension)   . Hx of echocardiogram    Echo 6/14: Mod LVH, focal basal hypertrophy, EF 50-55%, mild AS (mean 17 mmHg) and mild AI, mild to mod LAE, mild RAE  . Obesity   . Renal insufficiency   . Subclavian artery stenosis, right ([place])    based upon carotid U/S done 11/2012    Past Surgical History:  Procedure Laterality Date  . CORONARY ARTERY BYPASS GRAFT     2011, LIMA to LAD coronary artery, SVG to OM2 branch of lect circumflex coronary artery, and a sequential SVG to psot descening to posterolateral branches to RCA  . endoscopic vein harvesting     right leg   . HERNIA REPAIR    . RIGHT/LEFT HEART CATH AND CORONARY/GRAFT ANGIOGRAPHY N/A 12/31/2017   Procedure: RIGHT/LEFT HEART CATH AND CORONARY/GRAFT ANGIOGRAPHY;  Surgeon: Leonie Man, MD;  Location: [facility];  Service: Cardiovascular;  Laterality: N/A;  . TONSILLECTOMY      Current Medications: Current Meds  Medication Sig  . albuterol (PROVENTIL HFA;VENTOLIN HFA) 108 (90 Base) MCG/ACT inhaler Inhale 2 puffs into the lungs every 6 (six) hours as needed for wheezing or shortness of breath.  Marland Kitchen apixaban (ELIQUIS) 5 MG TABS tablet Take 1 tablet (5 mg total) by mouth 2 (two) times daily.  Marland Kitchen  atorvastatin (LIPITOR) 40 MG tablet TAKE 1 TABLET BY MOUTH  DAILY (Patient taking differently: Take 40 mg by mouth daily. )  . cholecalciferol (VITAMIN D3) 25 MCG (1000 UT) tablet Take 1,000 Units by mouth daily.  . colchicine 0.6 MG tablet Take 1 tablet (0.6 mg total) by mouth daily. 2 tablets now and then 1 tablet in one hour. Then one tablet daily until symptoms resolve. (Patient taking differently: Take 0.6-1.2 mg by mouth daily as needed (gout flare ups). )  . fluticasone furoate-vilanterol (BREO ELLIPTA) 100-25 MCG/INH AEPB Inhale 1 puff by mouth once daily (Patient taking differently: Inhale 1 puff into the lungs daily. )  . glimepiride  (AMARYL) 2 MG tablet Take 1 tablet (2 mg total) by mouth daily with breakfast.  . glucose blood (ONETOUCH VERIO) test strip Test BS daily Dx E11.9  . Lancets (ONETOUCH DELICA PLUS [id number]) MISC USE DAILY  . levothyroxine (SYNTHROID) 150 MCG tablet TAKE 1 TABLET BY MOUTH  DAILY (Patient taking differently: Take 150 mcg by mouth daily before breakfast. )  . metFORMIN (GLUCOPHAGE) 1000 MG tablet Take 1 tablet (1,000 mg total) by mouth 2 (two) times daily.  . Misc Natural Products (TART CHERRY ADVANCED PO) Take 1 tablet by mouth daily.   . nitroGLYCERIN (NITROSTAT) 0.4 MG SL tablet DISSOLVE ONE TABLET UNDER THE TONGUE EVERY 5 MINUTES AS NEEDED FOR CHEST PAIN.  DO NOT EXCEED A TOTAL OF 3 DOSES IN 15 MINUTES  . potassium chloride SA (KLOR-CON) 20 MEQ tablet TAKE 1 TABLET BY MOUTH  DAILY  . torsemide (DEMADEX) 20 MG tablet Take 2 tablets (40 mg total) by mouth daily.  . valsartan (DIOVAN) 40 MG tablet TAKE ONE-HALF TABLET BY  MOUTH DAILY (Patient taking differently: Take 20 mg by mouth daily. )     Allergies:   Prednisone, Januvia [sitagliptin], Jardiance [empagliflozin], and Lisinopril   Social History   Socioeconomic History  . Marital status: Married    Spouse name: Lucita Ferrara  . Number of children: 3  . Years of education: 8  . Highest education level: 8th grade  Occupational History  . Occupation: Retired    Comment: scrap yard  Tobacco Use  . Smoking status: Former Smoker    Quit date: 07/11/2009    Years since quitting: 10.4  . Smokeless tobacco: Never Used  . Tobacco comment: smoked about 10 cig/day; used to smoke 2 ppd for many years   Vaping Use  . Vaping Use: Never used  Substance and Sexual Activity  . Alcohol use: No    Alcohol/week: 0.0 standard drinks  . Drug use: No  . Sexual activity: Never  Other Topics Concern  . Not on file  Social History Narrative   Lives at home with his wife. He is retired but his wife continues to work daily. They have adult children that live  locally and grandchildren that they spend time with regularly. He is active around his home and yard.    Social Determinants of Health   Financial Resource Strain:   . Difficulty of Paying Living Expenses:   Food Insecurity:   . Worried About Charity fundraiser in the Last Year:   . Arboriculturist in the Last Year:   Transportation Needs:   . Film/video editor (Medical):   Marland Kitchen Lack of Transportation (Non-Medical):   Physical Activity:   . Days of Exercise per Week:   . Minutes of Exercise per Session:   Stress:   . Feeling of  Stress :   Social Connections:   . Frequency of Communication with Friends and Family:   . Frequency of Social Gatherings with Friends and Family:   . Attends Religious Services:   . Active Member of Clubs or Organizations:   . Attends Archivist Meetings:   Marland Kitchen Marital Status:      Family History: The patient's family history includes Depression in his maternal aunt; Diabetes in his brother; Heart attack in his father; Heart disease in his father; Mental illness in his mother and sister.  ROS:   Please see the history of present illness.    All other systems reviewed and are negative.  EKGs/Labs/Other Studies Reviewed:    The following studies were reviewed today: Echo 09/16/2019: FINDINGS  Left Ventricle: Left ventricular ejection fraction, by estimation, is 55  to 60%. The left ventricle has normal function. The left ventricle has no  regional wall motion abnormalities. The left ventricular internal cavity  size was normal in size. There is  mild left ventricular hypertrophy. Left ventricular diastolic parameters  are consistent with Grade I diastolic dysfunction (impaired relaxation).   Right Ventricle: The right ventricular size is normal. No increase in  right ventricular wall thickness. Right ventricular systolic function is  normal. Tricuspid regurgitation signal is inadequate for assessing PA  pressure.   Left Atrium: Left  atrial size was mildly dilated.   Right Atrium: Right atrial size was normal in size.   Pericardium: There is no evidence of pericardial effusion. Presence of  pericardial fat pad.   Mitral Valve: The mitral valve is grossly normal. Mild mitral annular  calcification. Trivial mitral valve regurgitation.   Tricuspid Valve: The tricuspid valve is grossly normal. Tricuspid valve  regurgitation is trivial.   Aortic Valve: The aortic valve is tricuspid. Aortic valve regurgitation is  not visualized. Severe aortic stenosis is present. Moderate aortic valve  annular calcification. There is moderate calcification of the aortic  valve. Aortic valve mean gradient  measures 52.0 mmHg. Aortic valve peak gradient measures 85.0 mmHg. Aortic  valve area, by VTI measures 0.62 cm.   Pulmonic Valve: The pulmonic valve was grossly normal. Pulmonic valve  regurgitation is trivial.   Aorta: The aortic root is normal in size and structure.   Venous: The inferior vena cava is dilated in size with greater than 50%  respiratory variability, suggesting right atrial pressure of 8 mmHg.   IAS/Shunts: No atrial level shunt detected by color flow Doppler.     LEFT VENTRICLE  PLAX 2D  LVIDd:     4.49 cm Diastology  LVIDs:     2.45 cm LV e' lateral:  6.64 cm/s  LV PW:     1.26 cm LV E/e' lateral: 19.7  LV IVS:    1.32 cm LV e' medial:  6.31 cm/s  LVOT diam:   1.90 cm LV E/e' medial: 20.8  LV SV:     65  LV SV Index:  30  LVOT Area:   2.84 cm     RIGHT VENTRICLE  RV Mid diam:  3.27 cm  RV S prime:   6.58 cm/s  TAPSE (M-mode): 1.5 cm   LEFT ATRIUM       Index  LA diam:    3.80 cm 1.74 cm/m  LA Vol (A2C):  68.5 ml 31.41 ml/m  LA Vol (A4C):  70.6 ml 32.37 ml/m  LA Biplane Vol: 69.5 ml 31.87 ml/m  AORTIC VALVE  AV Area (Vmax):  0.54 cm  AV Area (Vmean):  0.53 cm  AV Area (VTI):   0.62 cm  AV Vmax:      461.00 cm/s  AV Vmean:      336.000 cm/s  AV VTI:      1.040 m  AV Peak Grad:   85.0 mmHg  AV Mean Grad:   52.0 mmHg  LVOT Vmax:     87.50 cm/s  LVOT Vmean:    62.400 cm/s  LVOT VTI:     0.228 m  LVOT/AV VTI ratio: 0.22    AORTA  Ao Root diam: 4.00 cm   MITRAL VALVE  MV Area (PHT): 2.39 cm   SHUNTS  MV Decel Time: 318 msec   Systemic VTI: 0.23 m  MV E velocity: 131.00 cm/s Systemic Diam: 1.90 cm  MV A velocity: 151.00 cm/s  MV E/A ratio: 0.87   Cardiac catheterization 12/31/2017: Conclusion    Prox RCA to Dist RCA lesion is 100% stenosed.  Seq SVG- RPDA-RPL graft was visualized by angiography and is large. The graft exhibits no disease.  Prox Cx lesion is 95% stenosed. Mid Cx lesion is 100% stenosed beyond OM2  SVG-Cx graft was visualized by angiography and is normal in caliber. Origin to Prox Graft lesion is 100% stenosed - flush  Mid LAD lesion is 100% stenosed.  LIMA-LAD graft was visualized by angiography and is normal in caliber. The graft exhibits no disease.  Lat 1st Diag lesion is 90% stenosed.  RHC NUMBERS reviewed: LV end diastolic pressure is normal.- 12 mmHg; LV end diastolic pressure is normal.  There is moderate aortic valve stenosis.    Moderate aortic stenosis by catheterization, however there is significant respiratory variation and  beat-to-beat variation of blood pressures making it very difficult to Adequately Assess.  Severe native CAD with occluded mid RCA, subtotal occluded proximal circumflex after small OM branch, totally occluded LAD after D2.    Widely patent LIMA-LAD -Just after D2  Widely patent sequential SVG-RPDA-RPL1  Flush occlusion of SVG-OM  Recommend Aspirin 81mg  daily for severe native CAD.  Plan: Return to nursing unit with ongoing care.  Structural heart consultation for aortic valve replacement.  Would need to consider the possibility of native circumflex PCI, otherwise no notable PCI target. Defer  further care to primary team.  Diagnostic Dominance: Right Left Anterior Descending  Mid LAD lesion is 100% stenosed. The lesion is chronically occluded.  Lateral First Diagonal Branch  Lat 1st Diag lesion is 90% stenosed. The lesion is located at the bifurcation and discrete.  Second Diagonal Branch  Vessel is small in size.  Left Circumflex  Prox Cx lesion is 95% stenosed. The lesion is segmental and irregular.  Mid Cx lesion is 100% stenosed. The lesion is chronically occluded.  First Obtuse Marginal Branch  Vessel is small in size. There is moderate disease in the vessel.  Second Obtuse Marginal Branch  There is moderate disease in the vessel.  Right Coronary Artery  Vessel was injected. Vessel is moderate in size.  Prox RCA to Dist RCA lesion is 100% stenosed. The lesion is chronically occluded.  Sequential Jump Graft Graft To RPDA, 1st RPL  Seq SVG- RPDA-RPL graft was visualized by angiography and is large. The graft exhibits no disease.  Saphenous Graft To Dist Cx  SVG graft was visualized by angiography and is normal in caliber. SVG-OM Flush occluded  Origin to Prox Graft lesion is 100% stenosed. The lesion is chronically occluded.  LIMA LIMA Graft To Mid LAD  LIMA graft was visualized by angiography and is normal in caliber. The graft exhibits no disease.  Intervention  No interventions have been documented. Right Heart  Right Heart Pressures PA P: 25/11 mmHg-mean 17 mmHg. PCWP: 9 mmHg. LV EDP is normal. LVP -EDP: 195/3 mmHg, 14 mmHg; 173/0 mmHg-14 million mercury  Right Atrium The right atrium is moderately dilated.  Right atrial pressure is normal. RAP 4 mmHg  Right Ventricle The right ventricle is mildly dilated. RVP-EDP: 34/0 mmHg - 4 mmHg  Wall Motion  Resting       No LV Gram          Left Heart  Left Ventricle LV end diastolic pressure is normal.  Aortic Valve There is moderate aortic valve stenosis. Difficult to record because significant variation in  LV and AO pressures with beat-to-beat and respiratory variation.  Coronary Diagrams  Diagnostic Dominance: Right  Intervention  Implants   No implant documentation for this case.  Syngo Images  Show images for CARDIAC CATHETERIZATION Images on Long Term Storage  Show images for Obarr, MARDY LUCIER to Procedure Log  Procedure Log    Hemo Data   Most Recent Value  Fick Cardiac Output 6.2 L/min  Fick Cardiac Output Index 2.86 (L/min)/BSA  Aortic Mean Gradient 26.77 mmHg  Aortic Peak Gradient 21 mmHg  Aortic Valve Area 1.27  Aortic Value Area Index 0.58 cm2/BSA  RA A Wave 6 mmHg  RA V Wave 7 mmHg  RA Mean 4 mmHg  RV Systolic Pressure 34 mmHg  RV Diastolic Pressure 0 mmHg  RV EDP 4 mmHg  PA Systolic Pressure 25 mmHg  PA Diastolic Pressure 11 mmHg  PA Mean 17 mmHg  PW A Wave 9 mmHg  PW V Wave 9 mmHg  PW Mean 9 mmHg  AO Systolic Pressure 637 mmHg  AO Diastolic Pressure 59 mmHg  AO Mean 84 mmHg  LV Systolic Pressure 858 mmHg  LV Diastolic Pressure 3 mmHg  LV EDP 14 mmHg  AOp Systolic Pressure 850 mmHg  AOp Diastolic Pressure 74 mmHg  AOp Mean Pressure 277 mmHg  LVp Systolic Pressure 412 mmHg  LVp Diastolic Pressure 0 mmHg  LVp EDP Pressure 14 mmHg  QP/QS 1  TPVR Index 5.94 HRUI  TSVR Index 29.35 HRUI  PVR SVR Ratio 0.1  TPVR/TSVR Ratio 0.2     EKG:  EKG is not ordered today.  The ekg from 12/17/2019 shows NSR with incomplete RBBB and occasional PVC's.   Recent Labs: 10/17/2019: ALT 36; TSH 1.920 11/25/2019: BUN 13; Creatinine, Ser 0.94; Hemoglobin 13.3; Platelets 164; Potassium 3.6; Sodium 135  Recent Lipid Panel    Component Value Date/Time   CHOL 110 10/17/2019 0855   CHOL 138 12/26/2012 1310   TRIG 138 10/17/2019 0855   TRIG 90 05/23/2013 1452   TRIG 108 12/26/2012 1310   HDL 31 (L) 10/17/2019 0855   HDL 34 (L) 05/23/2013 1452   HDL 33 (L) 12/26/2012 1310   CHOLHDL 3.5 10/17/2019 0855   CHOLHDL 5.9 10/01/2009 0700   VLDL 16 10/01/2009 0700     LDLCALC 55 10/17/2019 0855   LDLCALC 81 05/23/2013 1452   LDLCALC 83 12/26/2012 1310    Physical Exam:    VS:  BP (!) 148/68   Pulse 78   Ht 5' 7.5" (1.715 m)   Wt (!) 248 lb (112.5 kg)   SpO2 97%   BMI 38.27 kg/m     Wt Readings from Last 3 Encounters:  12/25/19 Marland Kitchen)  248 lb (112.5 kg)  12/17/19 247 lb (112 kg)  11/26/19 243 lb (110.2 kg)     GEN:  Well nourished, well developed obese male in no acute distress HEENT: Normal NECK: No JVD; No carotid bruits LYMPHATICS: No lymphadenopathy CARDIAC: RRR, 3/6 harsh late peaking systolic murmur loudest at the left lower sternal border, no diastolic murmur RESPIRATORY:  Clear to auscultation without rales, wheezing or rhonchi  ABDOMEN: Soft, non-tender, non-distended MUSCULOSKELETAL: 1+ pretibial edema bilaterally; No deformity  SKIN: Warm and dry, chronic stasis changes both lower legs NEUROLOGIC:  Alert and oriented x 3 PSYCHIATRIC:  Normal affect   STS Risk Calculator: Isolated AVR (redo sternotomy): Risk of Mortality: 1.364% Renal Failure: 1.382% Permanent Stroke: 1.831% Prolonged Ventilation: 7.375% DSW Infection: 0.298% Reoperation: 3.478% Morbidity or Mortality: 11.009% Short Length of Stay: 36.542% Long Length of Stay: 4.700%   ASSESSMENT:    1. Nonrheumatic aortic valve stenosis   2. Coronary artery disease involving native coronary artery of native heart with angina pectoris ([place])    PLAN:    In order of problems listed above:  1. The patient has severe, stage D1 aortic stenosis associated with New York Heart Association functional class III symptoms of progressive diastolic heart failure, currently limited by shortness of breath and exertional chest discomfort.  The patient's most recent echocardiogram from April 2021 is reviewed.  It demonstrates severe calcification and restriction of all 3 aortic valve leaflets.  There is significant progression of aortic stenosis from his previous echo study,  now with a maximum transaortic velocity of 4.61 m/s, dimensionless index of 0.22, and mean transaortic gradient of 52 mmHg.  Cardiac catheterization images are personally reviewed from his December 31, 2017 study.  This demonstrated total occlusion of the proximal RCA, subtotal occlusion of the proximal left circumflex, and severe calcific stenosis of the LAD.  The LIMA to LAD graft was widely patent and it retrograde filled the diagonal.  The saphenous vein graft to OM was totally occluded.  The saphenous vein graft sequential to PDA and posterior AV segments was widely patent in this distribution collateralizes the left circumflex. I have reviewed the natural history of aortic stenosis with the patient and their family members who are present today. We have discussed the limitations of medical therapy and the poor prognosis associated with symptomatic aortic stenosis. We have reviewed potential treatment options, including palliative medical therapy, conventional surgical aortic valve replacement, and transcatheter aortic valve replacement. We discussed treatment options in the context of the patient's specific comorbid medical conditions.  The patient understands that his progressive symptoms are most likely related to worsening aortic stenosis, but he also is at risk for worsening myocardial ischemia now 10 years out from CABG.  Right and left heart catheterization is indicated for further evaluation. I have reviewed the risks, indications, and alternatives to cardiac catheterization, possible angioplasty, and stenting with the patient. Risks include but are not limited to bleeding, infection, vascular injury, stroke, myocardial infection, arrhythmia, kidney injury, radiation-related injury in the case of prolonged fluoroscopy use, emergency cardiac surgery, and death. The patient understands the risks of serious complication is 1-2 in 9924 with diagnostic cardiac cath and 1-2% or less with angioplasty/stenting.   Previous catheterization was done from left radial access.  As long as his coronary anatomy is stable, I would favor ongoing medical therapy of his CAD and treatment of progressive aortic stenosis.  He would undergo a gated cardiac CTA as well as a CTA of the chest, abdomen, and  pelvis.  He understands that these studies would be done for assessment of anatomic suitability for TAVR.  As part of his evaluation today, I reviewed the TAVR procedure with the patient and his wife.  I demonstrated a procedural animation and explained potential risks, indications, alternatives, as well as expected recovery.  Once his studies are completed, he will be referred back to see Dr. Cyndia Bent for formal cardiac surgical consultation as part of a multidisciplinary team approach to his care.  With his worsening symptoms, we will try to expedite his evaluation as much as possible.   Medication Adjustments/Labs and Tests Ordered: Current medicines are reviewed at length with the patient today.  Concerns regarding medicines are outlined above.  Orders Placed This Encounter  Procedures  . CT CORONARY MORPH W/CTA COR W/SCORE W/CA W/CM &/OR WO/CM  . CT ANGIO CHEST AORTA W/CM & OR WO/CM  . CT ANGIO ABDOMEN PELVIS  W &/OR WO CONTRAST  . Basic metabolic panel  . CBC with Differential/Platelet   No orders of the defined types were placed in this encounter.   Patient Instructions  CATHETERIZATION INSTRUCTIONS: You are scheduled for a Cardiac Catheterization on: Tuesday, July 27 with Dr. Daneen Schick.  1. Please arrive at the [facility] (Main Entrance A) at [facility]: [address] [place], [place] [address] at: 5:30AM (This time is two hours before your procedure to ensure your preparation). Free valet parking service is available. You are allowed ONE visitor in the waiting room during your procedure. Both you and your guest must wear masks. Special note: Every effort is made to have your procedure done on time.  Please understand that emergencies sometimes delay scheduled procedures.  2. Diet: Do not eat solid foods after midnight. You may have clear liquids until 5am upon the day of the procedure.  3. Labs: TODAY! BMET, CBC  4. Medication instructions in preparation for your procedure:  1) HOLD ELIQUIS 2 days prior to your cath  2) HOLD METFORMIN the morning of and 48 hours after your cath  3) HOLD GLIMEPIRIDE (Amaryl) the morning of your cath  4) HOLD TORSEMIDE the morning of your cath  5) TAKE ASPIRIN 81 mg the morning of your cath  6) You may take your other medications as directed with sips of water   5. Plan for one night stay--bring personal belongings. 6. Bring a current list of your medications and current insurance cards. 7. You MUST have a responsible person to drive you home. 8. Someone MUST be with you the first 24 hours after you arrive home or your discharge will be delayed. 9. Please wear clothes that are easy to get on and off and wear slip-on shoes.  Thank you for allowing [place] to care for you!   -- [facility] Invasive Cardiovascular services     Signed, [name], MD  12/25/2019 4:34 PM    [place]

## 2019-12-25 NOTE — Patient Instructions (Addendum)
CATHETERIZATION INSTRUCTIONS: You are scheduled for a Cardiac Catheterization on: Tuesday, July 27 with Dr. Daneen Schick.  1. Please arrive at the [facility] (Main Entrance A) at [facility]: [address] [place],  [address] at: 5:30AM (This time is two hours before your procedure to ensure your preparation). Free valet parking service is available. You are allowed ONE visitor in the waiting room during your procedure. Both you and your guest must wear masks. Special note: Every effort is made to have your procedure done on time. Please understand that emergencies sometimes delay scheduled procedures.  2. Diet: Do not eat solid foods after midnight. You may have clear liquids until 5am upon the day of the procedure.  3. Labs: TODAY! BMET, CBC  4. Medication instructions in preparation for your procedure:  1) HOLD ELIQUIS 2 days prior to your cath  2) HOLD METFORMIN the morning of and 48 hours after your cath  3) HOLD GLIMEPIRIDE (Amaryl) the morning of your cath  4) HOLD TORSEMIDE the morning of your cath  5) TAKE ASPIRIN 81 mg the morning of your cath  6) You may take your other medications as directed with sips of water   5. Plan for one night stay--bring personal belongings. 6. Bring a current list of your medications and current insurance cards. 7. You MUST have a responsible person to drive you home. 8. Someone MUST be with you the first 24 hours after you arrive home or your discharge will be delayed. 9. Please wear clothes that are easy to get on and off and wear slip-on shoes.  Thank you for allowing [place] to care for you!   -- [place] Invasive Cardiovascular services

## 2019-12-25 NOTE — H&P (View-Only) (Signed)
Cardiology Office Note:    Date:  12/25/2019   ID:  Matthew May, DOB [birth year], MRN [id number]  PCP:  Sharion Balloon, FNP  [facility] Cardiologist:  Minus Breeding, MD  Spray Electrophysiologist:  None   Referring MD: Sharion Balloon, FNP   Chief Complaint  Patient presents with  . Shortness of Breath    History of Present Illness:    Matthew May is a 74 y.o. male referred by Dr Percival Spanish for evaluation of severe aortic stenosis.   He has been followed for CAD since 2011 when he presented with NSTEMI and was found to have severe 3 vessel CAD. He was treated with multivessel CABG by Dr Cyndia Bent with a LIMA-LAD, SVG-OM, and sequential SVG-PDA and PLA. He has done well since his CABG until the past 12 months when he has developed exertional dyspnea and chest discomfort. He is now limited by shortness of breath with low level activity, such as walking short distances on level ground. He can't do much yard work anymore. He also has exertional chest pressure with low level activity that may last for several minutes after he stops and rests. He also experiences fatigue and exertional lightheadedness at times. He denies frank syncope. He was able to plant his garden in April but reports that he has worsened since then. Now he is sitting most of the time. He is not limited by arthritis or other problems.   The patient retired in 2011, and had his heart attack the following month. He has remained fairly active in retirement until this past year.   Past Medical History:  Diagnosis Date  . Aortic stenosis   . Atrial fibrillation ([place])   . CAD (coronary artery disease)    a. s/p NSTEMI 4/11 => s/p CABG (L-LAD, S-OM2, S-PDA/PL);  b.  ETT-Myoview 6/14:  normal study, no ischemia, EF 67%  . Carotid stenosis    Carotid U/S 3/90:  RICA 3-00%, LICA 92-33%; right vertebral flow retrograde suggestive of steel-consider PV consult  . COPD (chronic obstructive pulmonary disease) ([place])    . Diabetes mellitus without complication ([place])   . HLD (hyperlipidemia)   . HTN (hypertension)   . Hx of echocardiogram    Echo 6/14: Mod LVH, focal basal hypertrophy, EF 50-55%, mild AS (mean 17 mmHg) and mild AI, mild to mod LAE, mild RAE  . Obesity   . Renal insufficiency   . Subclavian artery stenosis, right ([place])    based upon carotid U/S done 11/2012    Past Surgical History:  Procedure Laterality Date  . CORONARY ARTERY BYPASS GRAFT     2011, LIMA to LAD coronary artery, SVG to OM2 branch of lect circumflex coronary artery, and a sequential SVG to psot descening to posterolateral branches to RCA  . endoscopic vein harvesting     right leg   . HERNIA REPAIR    . RIGHT/LEFT HEART CATH AND CORONARY/GRAFT ANGIOGRAPHY N/A 12/31/2017   Procedure: RIGHT/LEFT HEART CATH AND CORONARY/GRAFT ANGIOGRAPHY;  Surgeon: Leonie Man, MD;  Location: [place];  Service: Cardiovascular;  Laterality: N/A;  . TONSILLECTOMY      Current Medications: Current Meds  Medication Sig  . albuterol (PROVENTIL HFA;VENTOLIN HFA) 108 (90 Base) MCG/ACT inhaler Inhale 2 puffs into the lungs every 6 (six) hours as needed for wheezing or shortness of breath.  Marland Kitchen apixaban (ELIQUIS) 5 MG TABS tablet Take 1 tablet (5 mg total) by mouth 2 (two) times daily.  Marland Kitchen  atorvastatin (LIPITOR) 40 MG tablet TAKE 1 TABLET BY MOUTH  DAILY (Patient taking differently: Take 40 mg by mouth daily. )  . cholecalciferol (VITAMIN D3) 25 MCG (1000 UT) tablet Take 1,000 Units by mouth daily.  . colchicine 0.6 MG tablet Take 1 tablet (0.6 mg total) by mouth daily. 2 tablets now and then 1 tablet in one hour. Then one tablet daily until symptoms resolve. (Patient taking differently: Take 0.6-1.2 mg by mouth daily as needed (gout flare ups). )  . fluticasone furoate-vilanterol (BREO ELLIPTA) 100-25 MCG/INH AEPB Inhale 1 puff by mouth once daily (Patient taking differently: Inhale 1 puff into the lungs daily. )  . glimepiride  (AMARYL) 2 MG tablet Take 1 tablet (2 mg total) by mouth daily with breakfast.  . glucose blood (ONETOUCH VERIO) test strip Test BS daily Dx E11.9  . Lancets (ONETOUCH DELICA PLUS [id number]) MISC USE DAILY  . levothyroxine (SYNTHROID) 150 MCG tablet TAKE 1 TABLET BY MOUTH  DAILY (Patient taking differently: Take 150 mcg by mouth daily before breakfast. )  . metFORMIN (GLUCOPHAGE) 1000 MG tablet Take 1 tablet (1,000 mg total) by mouth 2 (two) times daily.  . Misc Natural Products (TART CHERRY ADVANCED PO) Take 1 tablet by mouth daily.   . nitroGLYCERIN (NITROSTAT) 0.4 MG SL tablet DISSOLVE ONE TABLET UNDER THE TONGUE EVERY 5 MINUTES AS NEEDED FOR CHEST PAIN.  DO NOT EXCEED A TOTAL OF 3 DOSES IN 15 MINUTES  . potassium chloride SA (KLOR-CON) 20 MEQ tablet TAKE 1 TABLET BY MOUTH  DAILY  . torsemide (DEMADEX) 20 MG tablet Take 2 tablets (40 mg total) by mouth daily.  . valsartan (DIOVAN) 40 MG tablet TAKE ONE-HALF TABLET BY  MOUTH DAILY (Patient taking differently: Take 20 mg by mouth daily. )     Allergies:   Prednisone, Januvia [sitagliptin], Jardiance [empagliflozin], and Lisinopril   Social History   Socioeconomic History  . Marital status: Married    Spouse name: Lucita Ferrara  . Number of children: 3  . Years of education: 8  . Highest education level: 8th grade  Occupational History  . Occupation: Retired    Comment: scrap yard  Tobacco Use  . Smoking status: Former Smoker    Quit date: 07/11/2009    Years since quitting: 10.4  . Smokeless tobacco: Never Used  . Tobacco comment: smoked about 10 cig/day; used to smoke 2 ppd for many years   Vaping Use  . Vaping Use: Never used  Substance and Sexual Activity  . Alcohol use: No    Alcohol/week: 0.0 standard drinks  . Drug use: No  . Sexual activity: Never  Other Topics Concern  . Not on file  Social History Narrative   Lives at home with his wife. He is retired but his wife continues to work daily. They have adult children that live  locally and grandchildren that they spend time with regularly. He is active around his home and yard.    Social Determinants of Health   Financial Resource Strain:   . Difficulty of Paying Living Expenses:   Food Insecurity:   . Worried About Charity fundraiser in the Last Year:   . Arboriculturist in the Last Year:   Transportation Needs:   . Film/video editor (Medical):   Marland Kitchen Lack of Transportation (Non-Medical):   Physical Activity:   . Days of Exercise per Week:   . Minutes of Exercise per Session:   Stress:   . Feeling of  Stress :   Social Connections:   . Frequency of Communication with Friends and Family:   . Frequency of Social Gatherings with Friends and Family:   . Attends Religious Services:   . Active Member of Clubs or Organizations:   . Attends Archivist Meetings:   Marland Kitchen Marital Status:      Family History: The patient's family history includes Depression in his maternal aunt; Diabetes in his brother; Heart attack in his father; Heart disease in his father; Mental illness in his mother and sister.  ROS:   Please see the history of present illness.    All other systems reviewed and are negative.  EKGs/Labs/Other Studies Reviewed:    The following studies were reviewed today: Echo 09/16/2019: FINDINGS  Left Ventricle: Left ventricular ejection fraction, by estimation, is 55  to 60%. The left ventricle has normal function. The left ventricle has no  regional wall motion abnormalities. The left ventricular internal cavity  size was normal in size. There is  mild left ventricular hypertrophy. Left ventricular diastolic parameters  are consistent with Grade I diastolic dysfunction (impaired relaxation).   Right Ventricle: The right ventricular size is normal. No increase in  right ventricular wall thickness. Right ventricular systolic function is  normal. Tricuspid regurgitation signal is inadequate for assessing PA  pressure.   Left Atrium: Left  atrial size was mildly dilated.   Right Atrium: Right atrial size was normal in size.   Pericardium: There is no evidence of pericardial effusion. Presence of  pericardial fat pad.   Mitral Valve: The mitral valve is grossly normal. Mild mitral annular  calcification. Trivial mitral valve regurgitation.   Tricuspid Valve: The tricuspid valve is grossly normal. Tricuspid valve  regurgitation is trivial.   Aortic Valve: The aortic valve is tricuspid. Aortic valve regurgitation is  not visualized. Severe aortic stenosis is present. Moderate aortic valve  annular calcification. There is moderate calcification of the aortic  valve. Aortic valve mean gradient  measures 52.0 mmHg. Aortic valve peak gradient measures 85.0 mmHg. Aortic  valve area, by VTI measures 0.62 cm.   Pulmonic Valve: The pulmonic valve was grossly normal. Pulmonic valve  regurgitation is trivial.   Aorta: The aortic root is normal in size and structure.   Venous: The inferior vena cava is dilated in size with greater than 50%  respiratory variability, suggesting right atrial pressure of 8 mmHg.   IAS/Shunts: No atrial level shunt detected by color flow Doppler.     LEFT VENTRICLE  PLAX 2D  LVIDd:     4.49 cm Diastology  LVIDs:     2.45 cm LV e' lateral:  6.64 cm/s  LV PW:     1.26 cm LV E/e' lateral: 19.7  LV IVS:    1.32 cm LV e' medial:  6.31 cm/s  LVOT diam:   1.90 cm LV E/e' medial: 20.8  LV SV:     65  LV SV Index:  30  LVOT Area:   2.84 cm     RIGHT VENTRICLE  RV Mid diam:  3.27 cm  RV S prime:   6.58 cm/s  TAPSE (M-mode): 1.5 cm   LEFT ATRIUM       Index  LA diam:    3.80 cm 1.74 cm/m  LA Vol (A2C):  68.5 ml 31.41 ml/m  LA Vol (A4C):  70.6 ml 32.37 ml/m  LA Biplane Vol: 69.5 ml 31.87 ml/m  AORTIC VALVE  AV Area (Vmax):  0.54 cm  AV Area (Vmean):  0.53 cm  AV Area (VTI):   0.62 cm  AV Vmax:      461.00 cm/s  AV Vmean:      336.000 cm/s  AV VTI:      1.040 m  AV Peak Grad:   85.0 mmHg  AV Mean Grad:   52.0 mmHg  LVOT Vmax:     87.50 cm/s  LVOT Vmean:    62.400 cm/s  LVOT VTI:     0.228 m  LVOT/AV VTI ratio: 0.22    AORTA  Ao Root diam: 4.00 cm   MITRAL VALVE  MV Area (PHT): 2.39 cm   SHUNTS  MV Decel Time: 318 msec   Systemic VTI: 0.23 m  MV E velocity: 131.00 cm/s Systemic Diam: 1.90 cm  MV A velocity: 151.00 cm/s  MV E/A ratio: 0.87   Cardiac catheterization 12/31/2017: Conclusion    Prox RCA to Dist RCA lesion is 100% stenosed.  Seq SVG- RPDA-RPL graft was visualized by angiography and is large. The graft exhibits no disease.  Prox Cx lesion is 95% stenosed. Mid Cx lesion is 100% stenosed beyond OM2  SVG-Cx graft was visualized by angiography and is normal in caliber. Origin to Prox Graft lesion is 100% stenosed - flush  Mid LAD lesion is 100% stenosed.  LIMA-LAD graft was visualized by angiography and is normal in caliber. The graft exhibits no disease.  Lat 1st Diag lesion is 90% stenosed.  RHC NUMBERS reviewed: LV end diastolic pressure is normal.- 12 mmHg; LV end diastolic pressure is normal.  There is moderate aortic valve stenosis.    Moderate aortic stenosis by catheterization, however there is significant respiratory variation and  beat-to-beat variation of blood pressures making it very difficult to Adequately Assess.  Severe native CAD with occluded mid RCA, subtotal occluded proximal circumflex after small OM branch, totally occluded LAD after D2.    Widely patent LIMA-LAD -Just after D2  Widely patent sequential SVG-RPDA-RPL1  Flush occlusion of SVG-OM  Recommend Aspirin 81mg  daily for severe native CAD.  Plan: Return to nursing unit with ongoing care.  Structural heart consultation for aortic valve replacement.  Would need to consider the possibility of native circumflex PCI, otherwise no notable PCI target. Defer  further care to primary team.  Diagnostic Dominance: Right Left Anterior Descending  Mid LAD lesion is 100% stenosed. The lesion is chronically occluded.  Lateral First Diagonal Branch  Lat 1st Diag lesion is 90% stenosed. The lesion is located at the bifurcation and discrete.  Second Diagonal Branch  Vessel is small in size.  Left Circumflex  Prox Cx lesion is 95% stenosed. The lesion is segmental and irregular.  Mid Cx lesion is 100% stenosed. The lesion is chronically occluded.  First Obtuse Marginal Branch  Vessel is small in size. There is moderate disease in the vessel.  Second Obtuse Marginal Branch  There is moderate disease in the vessel.  Right Coronary Artery  Vessel was injected. Vessel is moderate in size.  Prox RCA to Dist RCA lesion is 100% stenosed. The lesion is chronically occluded.  Sequential Jump Graft Graft To RPDA, 1st RPL  Seq SVG- RPDA-RPL graft was visualized by angiography and is large. The graft exhibits no disease.  Saphenous Graft To Dist Cx  SVG graft was visualized by angiography and is normal in caliber. SVG-OM Flush occluded  Origin to Prox Graft lesion is 100% stenosed. The lesion is chronically occluded.  LIMA LIMA Graft To Mid LAD  LIMA graft was visualized by angiography and is normal in caliber. The graft exhibits no disease.  Intervention  No interventions have been documented. Right Heart  Right Heart Pressures PA P: 25/11 mmHg-mean 17 mmHg. PCWP: 9 mmHg. LV EDP is normal. LVP -EDP: 195/3 mmHg, 14 mmHg; 173/0 mmHg-14 million mercury  Right Atrium The right atrium is moderately dilated.  Right atrial pressure is normal. RAP 4 mmHg  Right Ventricle The right ventricle is mildly dilated. RVP-EDP: 34/0 mmHg - 4 mmHg  Wall Motion  Resting       No LV Gram          Left Heart  Left Ventricle LV end diastolic pressure is normal.  Aortic Valve There is moderate aortic valve stenosis. Difficult to record because significant variation in  LV and AO pressures with beat-to-beat and respiratory variation.  Coronary Diagrams  Diagnostic Dominance: Right  Intervention  Implants   No implant documentation for this case.  Syngo Images  Show images for CARDIAC CATHETERIZATION Images on Long Term Storage  Show images for Nass, KHADEN GATER to Procedure Log  Procedure Log    Hemo Data   Most Recent Value  Fick Cardiac Output 6.2 L/min  Fick Cardiac Output Index 2.86 (L/min)/BSA  Aortic Mean Gradient 26.77 mmHg  Aortic Peak Gradient 21 mmHg  Aortic Valve Area 1.27  Aortic Value Area Index 0.58 cm2/BSA  RA A Wave 6 mmHg  RA V Wave 7 mmHg  RA Mean 4 mmHg  RV Systolic Pressure 34 mmHg  RV Diastolic Pressure 0 mmHg  RV EDP 4 mmHg  PA Systolic Pressure 25 mmHg  PA Diastolic Pressure 11 mmHg  PA Mean 17 mmHg  PW A Wave 9 mmHg  PW V Wave 9 mmHg  PW Mean 9 mmHg  AO Systolic Pressure 902 mmHg  AO Diastolic Pressure 59 mmHg  AO Mean 84 mmHg  LV Systolic Pressure 409 mmHg  LV Diastolic Pressure 3 mmHg  LV EDP 14 mmHg  AOp Systolic Pressure 735 mmHg  AOp Diastolic Pressure 74 mmHg  AOp Mean Pressure 329 mmHg  LVp Systolic Pressure 924 mmHg  LVp Diastolic Pressure 0 mmHg  LVp EDP Pressure 14 mmHg  QP/QS 1  TPVR Index 5.94 HRUI  TSVR Index 29.35 HRUI  PVR SVR Ratio 0.1  TPVR/TSVR Ratio 0.2     EKG:  EKG is not ordered today.  The ekg from 12/17/2019 shows NSR with incomplete RBBB and occasional PVC's.   Recent Labs: 10/17/2019: ALT 36; TSH 1.920 11/25/2019: BUN 13; Creatinine, Ser 0.94; Hemoglobin 13.3; Platelets 164; Potassium 3.6; Sodium 135  Recent Lipid Panel    Component Value Date/Time   CHOL 110 10/17/2019 0855   CHOL 138 12/26/2012 1310   TRIG 138 10/17/2019 0855   TRIG 90 05/23/2013 1452   TRIG 108 12/26/2012 1310   HDL 31 (L) 10/17/2019 0855   HDL 34 (L) 05/23/2013 1452   HDL 33 (L) 12/26/2012 1310   CHOLHDL 3.5 10/17/2019 0855   CHOLHDL 5.9 10/01/2009 0700   VLDL 16 10/01/2009 0700     LDLCALC 55 10/17/2019 0855   LDLCALC 81 05/23/2013 1452   LDLCALC 83 12/26/2012 1310    Physical Exam:    VS:  BP (!) 148/68   Pulse 78   Ht 5' 7.5" (1.715 m)   Wt (!) 248 lb (112.5 kg)   SpO2 97%   BMI 38.27 kg/m     Wt Readings from Last 3 Encounters:  12/25/19 Marland Kitchen)  248 lb (112.5 kg)  12/17/19 247 lb (112 kg)  11/26/19 243 lb (110.2 kg)     GEN:  Well nourished, well developed obese male in no acute distress HEENT: Normal NECK: No JVD; No carotid bruits LYMPHATICS: No lymphadenopathy CARDIAC: RRR, 3/6 harsh late peaking systolic murmur loudest at the left lower sternal border, no diastolic murmur RESPIRATORY:  Clear to auscultation without rales, wheezing or rhonchi  ABDOMEN: Soft, non-tender, non-distended MUSCULOSKELETAL: 1+ pretibial edema bilaterally; No deformity  SKIN: Warm and dry, chronic stasis changes both lower legs NEUROLOGIC:  Alert and oriented x 3 PSYCHIATRIC:  Normal affect   STS Risk Calculator: Isolated AVR (redo sternotomy): Risk of Mortality: 1.364% Renal Failure: 1.382% Permanent Stroke: 1.831% Prolonged Ventilation: 7.375% DSW Infection: 0.298% Reoperation: 3.478% Morbidity or Mortality: 11.009% Short Length of Stay: 36.542% Long Length of Stay: 4.700%   ASSESSMENT:    1. Nonrheumatic aortic valve stenosis   2. Coronary artery disease involving native coronary artery of native heart with angina pectoris ([place])    PLAN:    In order of problems listed above:  1. The patient has severe, stage D1 aortic stenosis associated with New York Heart Association functional class III symptoms of progressive diastolic heart failure, currently limited by shortness of breath and exertional chest discomfort.  The patient's most recent echocardiogram from April 2021 is reviewed.  It demonstrates severe calcification and restriction of all 3 aortic valve leaflets.  There is significant progression of aortic stenosis from his previous echo study,  now with a maximum transaortic velocity of 4.61 m/s, dimensionless index of 0.22, and mean transaortic gradient of 52 mmHg.  Cardiac catheterization images are personally reviewed from his December 31, 2017 study.  This demonstrated total occlusion of the proximal RCA, subtotal occlusion of the proximal left circumflex, and severe calcific stenosis of the LAD.  The LIMA to LAD graft was widely patent and it retrograde filled the diagonal.  The saphenous vein graft to OM was totally occluded.  The saphenous vein graft sequential to PDA and posterior AV segments was widely patent in this distribution collateralizes the left circumflex. I have reviewed the natural history of aortic stenosis with the patient and their family members who are present today. We have discussed the limitations of medical therapy and the poor prognosis associated with symptomatic aortic stenosis. We have reviewed potential treatment options, including palliative medical therapy, conventional surgical aortic valve replacement, and transcatheter aortic valve replacement. We discussed treatment options in the context of the patient's specific comorbid medical conditions.  The patient understands that his progressive symptoms are most likely related to worsening aortic stenosis, but he also is at risk for worsening myocardial ischemia now 10 years out from CABG.  Right and left heart catheterization is indicated for further evaluation. I have reviewed the risks, indications, and alternatives to cardiac catheterization, possible angioplasty, and stenting with the patient. Risks include but are not limited to bleeding, infection, vascular injury, stroke, myocardial infection, arrhythmia, kidney injury, radiation-related injury in the case of prolonged fluoroscopy use, emergency cardiac surgery, and death. The patient understands the risks of serious complication is 1-2 in 2353 with diagnostic cardiac cath and 1-2% or less with angioplasty/stenting.   Previous catheterization was done from left radial access.  As long as his coronary anatomy is stable, I would favor ongoing medical therapy of his CAD and treatment of progressive aortic stenosis.  He would undergo a gated cardiac CTA as well as a CTA of the chest, abdomen, and  pelvis.  He understands that these studies would be done for assessment of anatomic suitability for TAVR.  As part of his evaluation today, I reviewed the TAVR procedure with the patient and his wife.  I demonstrated a procedural animation and explained potential risks, indications, alternatives, as well as expected recovery.  Once his studies are completed, he will be referred back to see Dr. Cyndia Bent for formal cardiac surgical consultation as part of a multidisciplinary team approach to his care.  With his worsening symptoms, we will try to expedite his evaluation as much as possible.   Medication Adjustments/Labs and Tests Ordered: Current medicines are reviewed at length with the patient today.  Concerns regarding medicines are outlined above.  Orders Placed This Encounter  Procedures  . CT CORONARY MORPH W/CTA COR W/SCORE W/CA W/CM &/OR WO/CM  . CT ANGIO CHEST AORTA W/CM & OR WO/CM  . CT ANGIO ABDOMEN PELVIS  W &/OR WO CONTRAST  . Basic metabolic panel  . CBC with Differential/Platelet   No orders of the defined types were placed in this encounter.   Patient Instructions  CATHETERIZATION INSTRUCTIONS: You are scheduled for a Cardiac Catheterization on: Tuesday, July 27 with Dr. Daneen Schick.  1. Please arrive at the [facility] (Main Entrance A) at [facility]: [address] [place], [place] [address] at: 5:30AM (This time is two hours before your procedure to ensure your preparation). Free valet parking service is available. You are allowed ONE visitor in the waiting room during your procedure. Both you and your guest must wear masks. Special note: Every effort is made to have your procedure done on time.  Please understand that emergencies sometimes delay scheduled procedures.  2. Diet: Do not eat solid foods after midnight. You may have clear liquids until 5am upon the day of the procedure.  3. Labs: TODAY! BMET, CBC  4. Medication instructions in preparation for your procedure:  1) HOLD ELIQUIS 2 days prior to your cath  2) HOLD METFORMIN the morning of and 48 hours after your cath  3) HOLD GLIMEPIRIDE ([place]) the morning of your cath  4) HOLD TORSEMIDE the morning of your cath  5) TAKE ASPIRIN 81 mg the morning of your cath  6) You may take your other medications as directed with sips of water   5. Plan for one night stay--bring personal belongings. 6. Bring a current list of your medications and current insurance cards. 7. You MUST have a responsible person to drive you home. 8. Someone MUST be with you the first 24 hours after you arrive home or your discharge will be delayed. 9. Please wear clothes that are easy to get on and off and wear slip-on shoes.  Thank you for allowing [place] to care for you!   -- [facility] Invasive Cardiovascular services     Signed, [name], MD  12/25/2019 4:34 PM    [place]

## 2019-12-29 ENCOUNTER — Telehealth: Payer: Self-pay | Admitting: *Deleted

## 2019-12-29 NOTE — Telephone Encounter (Addendum)
Pt contacted pre-catheterization scheduled at [facility] for: Tuesday December 30, 2019 7:30 AM Verified arrival time and place: [facility]) at: 5:30 AM   No solid food after midnight prior to cath, clear liquids until 5 AM day of procedure.  Hold: Eliquis-none 12/28/19 until post procedure Glimepiride-AM of procedure Torsemide/KCl-AM of procedure Metformin-day of procedure and 48 hours post procedure  Except hold medications AM meds can be  taken pre-cath with sips of water including: ASA 81 mg   Confirmed patient has responsible adult to drive home post procedure and observe 24 hours after arriving home: yes  You are allowed ONE visitor in the waiting room during your procedure. Both you and your visitor must wear a mask once you enter the hospital.      COVID-19 Pre-Screening Questions:  . In the past 14 days have you had a new cough associated with shortness of breath, fever (100.4 or greater) or sudden loss of taste or sense of smell? no . In the past 14 days have you been around anyone with known Covid 19? no . Any international travel in the past 14 days? no . Have you been vaccinated for COVID-19? Yes, see immunization history    Reviewed procedure/mask/visitor instructions, COVID-19 screening questions with patient.

## 2019-12-29 NOTE — Telephone Encounter (Signed)
No answer, voicemail message. 

## 2019-12-30 ENCOUNTER — Ambulatory Visit (HOSPITAL_COMMUNITY)
Admission: RE | Admit: 2019-12-30 | Discharge: 2019-12-30 | Disposition: A | Discharge status: Home / Self Care | Payer: Medicare Other | Attending: Interventional Cardiology | Admitting: Interventional Cardiology

## 2019-12-30 ENCOUNTER — Encounter (HOSPITAL_COMMUNITY)
Admission: RE | Disposition: A | Discharge status: Home / Self Care | Payer: Medicare Other | Source: Home / Self Care | Attending: Interventional Cardiology

## 2019-12-30 DIAGNOSIS — E785 Hyperlipidemia, unspecified: Secondary | ICD-10-CM | POA: Diagnosis not present

## 2019-12-30 DIAGNOSIS — I10 Essential (primary) hypertension: Secondary | ICD-10-CM | POA: Diagnosis not present

## 2019-12-30 DIAGNOSIS — Z87891 Personal history of nicotine dependence: Secondary | ICD-10-CM | POA: Insufficient documentation

## 2019-12-30 DIAGNOSIS — E119 Type 2 diabetes mellitus without complications: Secondary | ICD-10-CM | POA: Insufficient documentation

## 2019-12-30 DIAGNOSIS — I25118 Atherosclerotic heart disease of native coronary artery with other forms of angina pectoris: Secondary | ICD-10-CM | POA: Insufficient documentation

## 2019-12-30 DIAGNOSIS — E669 Obesity, unspecified: Secondary | ICD-10-CM | POA: Insufficient documentation

## 2019-12-30 DIAGNOSIS — I4891 Unspecified atrial fibrillation: Secondary | ICD-10-CM | POA: Insufficient documentation

## 2019-12-30 DIAGNOSIS — E1169 Type 2 diabetes mellitus with other specified complication: Secondary | ICD-10-CM | POA: Diagnosis present

## 2019-12-30 DIAGNOSIS — Z6837 Body mass index (BMI) 37.0-37.9, adult: Secondary | ICD-10-CM | POA: Insufficient documentation

## 2019-12-30 DIAGNOSIS — I6523 Occlusion and stenosis of bilateral carotid arteries: Secondary | ICD-10-CM | POA: Diagnosis not present

## 2019-12-30 DIAGNOSIS — Z951 Presence of aortocoronary bypass graft: Secondary | ICD-10-CM | POA: Diagnosis not present

## 2019-12-30 DIAGNOSIS — I480 Paroxysmal atrial fibrillation: Secondary | ICD-10-CM | POA: Diagnosis present

## 2019-12-30 DIAGNOSIS — I252 Old myocardial infarction: Secondary | ICD-10-CM | POA: Diagnosis not present

## 2019-12-30 DIAGNOSIS — I350 Nonrheumatic aortic (valve) stenosis: Secondary | ICD-10-CM | POA: Diagnosis not present

## 2019-12-30 DIAGNOSIS — I2582 Chronic total occlusion of coronary artery: Secondary | ICD-10-CM | POA: Insufficient documentation

## 2019-12-30 DIAGNOSIS — Z7901 Long term (current) use of anticoagulants: Secondary | ICD-10-CM | POA: Diagnosis not present

## 2019-12-30 DIAGNOSIS — Z79899 Other long term (current) drug therapy: Secondary | ICD-10-CM | POA: Diagnosis not present

## 2019-12-30 DIAGNOSIS — Z7984 Long term (current) use of oral hypoglycemic drugs: Secondary | ICD-10-CM | POA: Insufficient documentation

## 2019-12-30 DIAGNOSIS — J449 Chronic obstructive pulmonary disease, unspecified: Secondary | ICD-10-CM | POA: Diagnosis not present

## 2019-12-30 DIAGNOSIS — I2510 Atherosclerotic heart disease of native coronary artery without angina pectoris: Secondary | ICD-10-CM | POA: Diagnosis not present

## 2019-12-30 DIAGNOSIS — Z79890 Hormone replacement therapy (postmenopausal): Secondary | ICD-10-CM | POA: Diagnosis not present

## 2019-12-30 HISTORY — PX: RIGHT/LEFT HEART CATH AND CORONARY/GRAFT ANGIOGRAPHY: CATH118267

## 2019-12-30 LAB — POCT I-STAT 7, (LYTES, BLD GAS, ICA,H+H)
Acid-Base Excess: 3 mmol/L — ABNORMAL HIGH (ref 0.0–2.0)
Bicarbonate: 29.2 mmol/L — ABNORMAL HIGH (ref 20.0–28.0)
Calcium, Ion: 1.21 mmol/L (ref 1.15–1.40)
HCT: 37 % — ABNORMAL LOW (ref 39.0–52.0)
Hemoglobin: 12.6 g/dL — ABNORMAL LOW (ref 13.0–17.0)
O2 Saturation: 98 %
Potassium: 3.8 mmol/L (ref 3.5–5.1)
Sodium: 142 mmol/L (ref 135–145)
TCO2: 31 mmol/L (ref 22–32)
pCO2 arterial: 50.3 mmHg — ABNORMAL HIGH (ref 32.0–48.0)
pH, Arterial: 7.372 (ref 7.350–7.450)
pO2, Arterial: 118 mmHg — ABNORMAL HIGH (ref 83.0–108.0)

## 2019-12-30 LAB — GLUCOSE, CAPILLARY: Glucose-Capillary: 125 mg/dL — ABNORMAL HIGH (ref 70–99)

## 2019-12-30 LAB — POCT I-STAT EG7
Acid-Base Excess: 3 mmol/L — ABNORMAL HIGH (ref 0.0–2.0)
Bicarbonate: 29.8 mmol/L — ABNORMAL HIGH (ref 20.0–28.0)
Calcium, Ion: 1.22 mmol/L (ref 1.15–1.40)
HCT: 37 % — ABNORMAL LOW (ref 39.0–52.0)
Hemoglobin: 12.6 g/dL — ABNORMAL LOW (ref 13.0–17.0)
O2 Saturation: 74 %
Potassium: 3.9 mmol/L (ref 3.5–5.1)
Sodium: 142 mmol/L (ref 135–145)
TCO2: 31 mmol/L (ref 22–32)
pCO2, Ven: 54.8 mmHg (ref 44.0–60.0)
pH, Ven: 7.343 (ref 7.250–7.430)
pO2, Ven: 42 mmHg (ref 32.0–45.0)

## 2019-12-30 SURGERY — RIGHT/LEFT HEART CATH AND CORONARY/GRAFT ANGIOGRAPHY
Anesthesia: LOCAL

## 2019-12-30 MED ORDER — SODIUM CHLORIDE 0.9 % IV SOLN: 250.0000 mL | INTRAVENOUS | Status: DC | PRN

## 2019-12-30 MED ORDER — IOHEXOL 350 MG/ML SOLN
INTRAVENOUS | Status: AC
  Filled 2019-12-30: qty 1

## 2019-12-30 MED ORDER — LIDOCAINE HCL (PF) 1 % IJ SOLN
INTRAMUSCULAR | Status: DC | PRN
  Administered 2019-12-30: 20 mL

## 2019-12-30 MED ORDER — MIDAZOLAM HCL 2 MG/2ML IJ SOLN
INTRAMUSCULAR | Status: AC
  Filled 2019-12-30: qty 2

## 2019-12-30 MED ORDER — SODIUM CHLORIDE 0.9% FLUSH: 3.0000 mL | INTRAVENOUS | Status: DC | PRN

## 2019-12-30 MED ORDER — LABETALOL HCL 5 MG/ML IV SOLN: 10.0000 mg | INTRAVENOUS | Status: DC | PRN

## 2019-12-30 MED ORDER — SODIUM CHLORIDE 0.9 % WEIGHT BASED INFUSION
3.0000 mL/kg/h | INTRAVENOUS | Status: DC
  Administered 2019-12-30: 3 mL/kg/h via INTRAVENOUS

## 2019-12-30 MED ORDER — SODIUM CHLORIDE 0.9 % WEIGHT BASED INFUSION: 1.0000 mL/kg/h | INTRAVENOUS | Status: DC

## 2019-12-30 MED ORDER — HEPARIN (PORCINE) IN NACL 1000-0.9 UT/500ML-% IV SOLN
INTRAVENOUS | Status: DC | PRN
  Administered 2019-12-30 (×2): 500 mL

## 2019-12-30 MED ORDER — FENTANYL CITRATE (PF) 100 MCG/2ML IJ SOLN
INTRAMUSCULAR | Status: DC | PRN
  Administered 2019-12-30: 25 ug via INTRAVENOUS

## 2019-12-30 MED ORDER — FENTANYL CITRATE (PF) 100 MCG/2ML IJ SOLN
INTRAMUSCULAR | Status: AC
  Filled 2019-12-30: qty 2

## 2019-12-30 MED ORDER — OXYCODONE HCL 5 MG PO TABS: 5.0000 mg | ORAL_TABLET | ORAL | Status: DC | PRN

## 2019-12-30 MED ORDER — SODIUM CHLORIDE 0.9% FLUSH: 3.0000 mL | Freq: Two times a day (BID) | INTRAVENOUS | Status: DC

## 2019-12-30 MED ORDER — HEPARIN (PORCINE) IN NACL 1000-0.9 UT/500ML-% IV SOLN
INTRAVENOUS | Status: AC
  Filled 2019-12-30: qty 1000

## 2019-12-30 MED ORDER — ACETAMINOPHEN 325 MG PO TABS: 650.0000 mg | ORAL_TABLET | ORAL | Status: DC | PRN

## 2019-12-30 MED ORDER — LIDOCAINE HCL (PF) 1 % IJ SOLN
INTRAMUSCULAR | Status: AC
  Filled 2019-12-30: qty 30

## 2019-12-30 MED ORDER — HYDRALAZINE HCL 20 MG/ML IJ SOLN: 10.0000 mg | INTRAMUSCULAR | Status: DC | PRN

## 2019-12-30 MED ORDER — ONDANSETRON HCL 4 MG/2ML IJ SOLN: 4.0000 mg | Freq: Four times a day (QID) | INTRAMUSCULAR | Status: DC | PRN

## 2019-12-30 MED ORDER — MIDAZOLAM HCL 2 MG/2ML IJ SOLN
INTRAMUSCULAR | Status: DC | PRN
  Administered 2019-12-30 (×2): 1 mg via INTRAVENOUS

## 2019-12-30 MED ORDER — SODIUM CHLORIDE 0.9 % IV SOLN: INTRAVENOUS | Status: AC

## 2019-12-30 MED ORDER — ASPIRIN 81 MG PO CHEW: 81.0000 mg | CHEWABLE_TABLET | ORAL | Status: DC

## 2019-12-30 SURGICAL SUPPLY — 15 items
CATH INFINITI 5 FR IM (CATHETERS) ×1 IMPLANT
CATH INFINITI 5FR MPB2 (CATHETERS) ×1 IMPLANT
CATH INFINITI 5FR MULTPACK ANG (CATHETERS) IMPLANT
CATH SWAN GANZ 7F STRAIGHT (CATHETERS) ×1 IMPLANT
GLIDESHEATH SLEND A-KIT 6F 22G (SHEATH) IMPLANT
GLIDESHEATH SLENDER 7FR .021G (SHEATH) IMPLANT
KIT HEART LEFT (KITS) ×2 IMPLANT
PACK CARDIAC CATHETERIZATION (CUSTOM PROCEDURE TRAY) ×2 IMPLANT
SHEATH PINNACLE 5F 10CM (SHEATH) ×1 IMPLANT
SHEATH PINNACLE 7F 10CM (SHEATH) ×1 IMPLANT
SHEATH PROBE COVER 6X72 (BAG) ×1 IMPLANT
TRANSDUCER W/STOPCOCK (MISCELLANEOUS) ×2 IMPLANT
TUBING CIL FLEX 10 FLL-RA (TUBING) ×2 IMPLANT
WIRE EMERALD 3MM-J .035X150CM (WIRE) ×1 IMPLANT
WIRE EMERALD ST .035X150CM (WIRE) ×1 IMPLANT

## 2019-12-30 NOTE — CV Procedure (Signed)
CONCLUSIONS  Severe calcific aortic stenosis.  Peak to peak gradient 69 mmHg with mean gradient 59 mmHg.  Aortic valve area 0.78 cm.  Relatively high output state documented by both Fick (7.2 L/min) and thermal dilution (7.99 L/min)  Occluded saphenous vein graft to the circumflex.  Patent sequential saphenous vein graft to the PDA and PL.  Widely patent LIMA to LAD.  Widely patent native left main.  Patent LAD with competitive flow into the distal LAD from LIMA.  Severe diffuse disease in the mid to distal circumflex with functional total occlusion and collateralization of the second obtuse marginal from right to left collaterals.  Total occlusion mid RCA  Normal pulmonary wedge pressure and mildly elevated pulmonary artery pressures (mean pressure 23 mmHg).Marland Kitchen  RECOMMENDATIONS   Resume Eliquis a.m. 12/31/2019.  Forward data to the TAVR valve clinic, Dr. Burt Knack, and Dr. Arvid Right.

## 2019-12-30 NOTE — Discharge Instructions (Signed)
Resume Eliquis with the AM dose on 12/31/2019.   Femoral Site Care This sheet gives you information about how to care for yourself after your procedure. Your health care provider may also give you more specific instructions. If you have problems or questions, contact your health care provider. What can I expect after the procedure? After the procedure, it is common to have:  Bruising that usually fades within 1-2 weeks.  Tenderness at the site. Follow these instructions at home: Wound care  Follow instructions from your health care provider about how to take care of your insertion site. Make sure you: ? Wash your hands with soap and water before you change your bandage (dressing). If soap and water are not available, use hand sanitizer. ? Change your dressing as told by your health care provider. ? Leave stitches (sutures), skin glue, or adhesive strips in place. These skin closures may need to stay in place for 2 weeks or longer. If adhesive strip edges start to loosen and curl up, you may trim the loose edges. Do not remove adhesive strips completely unless your health care provider tells you to do that.  Do not take baths, swim, or use a hot tub until your health care provider approves.  You may shower 24-48 hours after the procedure or as told by your health care provider. ? Gently wash the site with plain soap and water. ? Pat the area dry with a clean towel. ? Do not rub the site. This may cause bleeding.  Do not apply powder or lotion to the site. Keep the site clean and dry.  Check your femoral site every day for signs of infection. Check for: ? Redness, swelling, or pain. ? Fluid or blood. ? Warmth. ? Pus or a bad smell. Activity  For the first 2-3 days after your procedure, or as long as directed: ? Avoid climbing stairs as much as possible. ? Do not squat.  Do not lift anything that is heavier than 10 lb (4.5 kg), or the limit that you are told, until your health care  provider says that it is safe.  Rest as directed. ? Avoid sitting for a long time without moving. Get up to take short walks every 1-2 hours.  Do not drive for 24 hours if you were given a medicine to help you relax (sedative). General instructions  Take over-the-counter and prescription medicines only as told by your health care provider.  Keep all follow-up visits as told by your health care provider. This is important. Contact a health care provider if you have:  A fever or chills.  You have redness, swelling, or pain around your insertion site. Get help right away if:  The catheter insertion area swells very fast.  You pass out.  You suddenly start to sweat or your skin gets clammy.  The catheter insertion area is bleeding, and the bleeding does not stop when you hold steady pressure on the area.  The area near or just beyond the catheter insertion site becomes pale, cool, tingly, or numb. These symptoms may represent a serious problem that is an emergency. Do not wait to see if the symptoms will go away. Get medical help right away. Call your local emergency services (911 in the [place] Do not drive yourself to the hospital. Summary  After the procedure, it is common to have bruising that usually fades within 1-2 weeks.  Check your femoral site every day for signs of infection.  Do not lift anything  that is heavier than 10 lb (4.5 kg), or the limit that you are told, until your health care provider says that it is safe. This information is not intended to replace advice given to you by your health care provider. Make sure you discuss any questions you have with your health care provider. Document Revised: 06/04/2017 Document Reviewed: 06/04/2017 Elsevier Patient Education  2020 [employer].

## 2019-12-30 NOTE — Interval H&P Note (Signed)
Cath Lab Visit (complete for each Cath Lab visit)  Clinical Evaluation Leading to the Procedure:   ACS: No.  Non-ACS:    Anginal Classification: CCS May  Anti-ischemic medical therapy: Minimal Therapy (1 class of medications)  Non-Invasive Test Results: No non-invasive testing performed  Prior CABG: No previous CABG      History and Physical Interval Note:  12/30/2019 7:43 AM  Matthew May  has presented today for surgery, with the diagnosis of Aortic stenosis.  The various methods of treatment have been discussed with the patient and family. After consideration of risks, benefits and other options for treatment, the patient has consented to  Procedure(s): RIGHT/LEFT HEART CATH AND CORONARY/GRAFT ANGIOGRAPHY (N/A) as a surgical intervention.  The patient's history has been reviewed, patient examined, no change in status, stable for surgery.  I have reviewed the patient's chart and labs.  Questions were answered to the patient's satisfaction.     Matthew May

## 2019-12-30 NOTE — Progress Notes (Signed)
Site area- right  Site Prior to Removal- 0   Pressure Applied For-  18 MInutes   Bedrest Beginning at - 0918   Manual- Yes   Patient Status During Pull- Stable    Post Pull Groin Site- 0   Post Pull Instructions Given- Yes   Post Pull Pulses Present- Yes    Dressing Applied- Tegaderm and Gauze Dressing    Comments:

## 2019-12-31 ENCOUNTER — Other Ambulatory Visit: Payer: Self-pay | Admitting: Family

## 2019-12-31 ENCOUNTER — Encounter (HOSPITAL_COMMUNITY): Payer: Self-pay | Admitting: Interventional Cardiology

## 2019-12-31 ENCOUNTER — Other Ambulatory Visit: Payer: Self-pay

## 2019-12-31 MED ORDER — METOPROLOL TARTRATE 50 MG PO TABS
ORAL_TABLET | ORAL | 0 refills | Status: DC
Start: 2019-12-31 — End: 2020-01-28

## 2019-12-31 NOTE — Progress Notes (Signed)
Metoprolol Tartrate Rx sent to pharmacy in preparation for Cardiac CT.

## 2020-01-01 ENCOUNTER — Encounter: Payer: Self-pay | Admitting: Physical Therapy

## 2020-01-01 ENCOUNTER — Ambulatory Visit: Payer: Medicare Other | Attending: Cardiovascular Disease | Admitting: Physical Therapy

## 2020-01-01 ENCOUNTER — Other Ambulatory Visit: Payer: Self-pay

## 2020-01-01 ENCOUNTER — Ambulatory Visit (HOSPITAL_COMMUNITY)
Admission: RE | Admit: 2020-01-01 | Discharge: 2020-01-01 | Disposition: A | Discharge status: Home / Self Care | Payer: Medicare Other | Source: Ambulatory Visit | Attending: Cardiovascular Disease | Admitting: Cardiovascular Disease

## 2020-01-01 DIAGNOSIS — I708 Atherosclerosis of other arteries: Secondary | ICD-10-CM | POA: Diagnosis not present

## 2020-01-01 DIAGNOSIS — I350 Nonrheumatic aortic (valve) stenosis: Secondary | ICD-10-CM

## 2020-01-01 DIAGNOSIS — I700 Atherosclerosis of aorta: Secondary | ICD-10-CM | POA: Diagnosis not present

## 2020-01-01 DIAGNOSIS — Z01818 Encounter for other preprocedural examination: Secondary | ICD-10-CM | POA: Diagnosis not present

## 2020-01-01 DIAGNOSIS — R262 Difficulty in walking, not elsewhere classified: Secondary | ICD-10-CM

## 2020-01-01 DIAGNOSIS — I2510 Atherosclerotic heart disease of native coronary artery without angina pectoris: Secondary | ICD-10-CM | POA: Diagnosis not present

## 2020-01-01 DIAGNOSIS — I358 Other nonrheumatic aortic valve disorders: Secondary | ICD-10-CM | POA: Diagnosis not present

## 2020-01-01 MED ORDER — IOHEXOL 350 MG/ML SOLN
100.0000 mL | Freq: Once | INTRAVENOUS | Status: AC | PRN
  Administered 2020-01-01: 100 mL via INTRAVENOUS

## 2020-01-01 NOTE — Therapy (Signed)
[place], [place], [address] Phone: [phone]   Fax:  [phone]  Physical Therapy Evaluation/TAVR  Patient Details  Name: Matthew May MRN: [id number] Date of Birth: May 17, 1946 Referring Provider (PT): severe aortic stenosis   Encounter Date: 01/01/2020   PT End of Session - 01/01/20 1442    Visit Number 1    PT Start Time 1410    PT Stop Time 1440    PT Time Calculation (min) 30 min    Activity Tolerance Patient tolerated treatment well    Behavior During Therapy [facility] for tasks assessed/performed           Past Medical History:  Diagnosis Date  . Aortic stenosis   . Atrial fibrillation ([place])   . CAD (coronary artery disease)    a. s/p NSTEMI 4/11 => s/p CABG (L-LAD, S-OM2, S-PDA/PL);  b.  ETT-Myoview 6/14:  normal study, no ischemia, EF 67%  . Carotid stenosis    Carotid U/S 6/01:  RICA 0-93%, LICA 23-55%; right vertebral flow retrograde suggestive of steel-consider PV consult  . COPD (chronic obstructive pulmonary disease) ([place])   . Diabetes mellitus without complication ([place])   . HLD (hyperlipidemia)   . HTN (hypertension)   . Hx of echocardiogram    Echo 6/14: Mod LVH, focal basal hypertrophy, EF 50-55%, mild AS (mean 17 mmHg) and mild AI, mild to mod LAE, mild RAE  . Obesity   . Renal insufficiency   . Subclavian artery stenosis, right ([place])    based upon carotid U/S done 11/2012    Past Surgical History:  Procedure Laterality Date  . CORONARY ARTERY BYPASS GRAFT     2011, LIMA to LAD coronary artery, SVG to OM2 branch of lect circumflex coronary artery, and a sequential SVG to psot descening to posterolateral branches to RCA  . endoscopic vein harvesting     right leg   . HERNIA REPAIR    . RIGHT/LEFT HEART CATH AND CORONARY/GRAFT ANGIOGRAPHY N/A 12/31/2017   Procedure: RIGHT/LEFT HEART CATH AND CORONARY/GRAFT ANGIOGRAPHY;  Surgeon: Leonie Man, MD;  Location: [place] [facility]  [facility];  Service: Cardiovascular;  Laterality: N/A;  . RIGHT/LEFT HEART CATH AND CORONARY/GRAFT ANGIOGRAPHY N/A 12/30/2019   Procedure: RIGHT/LEFT HEART CATH AND CORONARY/GRAFT ANGIOGRAPHY;  Surgeon: Belva Crome, MD;  Location: [place] [facility];  Service: Cardiovascular;  Laterality: N/A;  . TONSILLECTOMY      There were no vitals filed for this visit.    Subjective Assessment - 01/01/20 1414    Subjective Used to do a lot of yard work but now cannot due to SOB. Gets dizzy with activity. Denies LOB. Legs feel weak and hurt down near the ankles.    Currently in Pain? Yes    Pain Score 3     Pain Location Chest    Pain Descriptors / Indicators --   short of breath             [facility] PT Assessment - 01/01/20 0001      Assessment   Medical Diagnosis [name], MD    Referring Provider (PT) severe aortic stenosis    Hand Dominance Right      Precautions   Precautions None      Restrictions   Weight Bearing Restrictions No      Balance Screen   Has the patient fallen in the past 6 months No      [place]  residence    Living Arrangements Spouse/significant other    Additional Comments 4-5 steps to enter home      Prior Function   Level of [place] Retired      Associate Professor   Overall Cognitive Status Within Functional Limits for tasks assessed      Sensation   Additional Comments numbness in toes, some tingling in legs      Posture/Postural Control   Posture Comments forward head rounded shoulders      ROM / Strength   AROM / PROM / Strength AROM;Strength      AROM   Overall AROM Comments WFL- gets dizzy with forward bending      Strength   Overall Strength Comments UE gross 4/5- chest pain with MMT, LE gross 4+/5    Strength Assessment Site Hand    Right/Left hand Right;Left    Right Hand Grip (lbs) 15    Left Hand Grip (lbs) 25            [facility] Pre-Surgical Assessment - 01/01/20 0001      5 Meter Walk Test- trial 1 6 sec    5 Meter Walk Test- trial 2 6 sec.     5 Meter Walk Test- trial 3 6 sec.    5 meter walk test average 6 sec    4 Stage Balance Test Position 3    Sit To Stand Test- trial 1 38 sec.    Comment pressing from thighs    6 Minute Walk- Baseline yes    BP (mmHg) 122/54    HR (bpm) 62    02 Sat (%RA) 95 %    Modified Borg Scale for Dyspnea 2- Mild shortness of breath    Perceived Rate of Exertion (Borg) 6-    6 Minute Walk Post Test yes    BP (mmHg) 137/63    HR (bpm) 72    02 Sat (%RA) 95 %    Modified Borg Scale for Dyspnea 4- somewhat severe    Perceived Rate of Exertion (Borg) 13- Somewhat hard    Aerobic Endurance Distance Walked 575    Endurance additional comments 67% limitation compared to age-related normative values                    Objective measurements completed on examination: See above findings.                            Plan - 01/01/20 1442    PT Frequency One time visit    Consulted and Agree with Plan of Care Patient           Clinical Impression Statement: Pt is a 74 yo M presenting to OP PT for evaluation prior to possible TAVR surgery due to severe aortic stenosis. Pt reports onset of SOB "some time" ago with surgical need about 10 years ago. Symptoms are  limiting functional endurance and tolerance to ADLs. Pt presents with WFL ROM and mild limitations in strengh strength. Reports regular musculoskeletal pain across his chest and pain in bilateral lower legs due to blood flow issues.  In the first 3 min pt took multiple standing rest breaks lasting 10-15s. Pt ambulated 370 feet in 3:30 before requesting a seated rest beak lasting 1 min. At time of rest, patient's HR was 85 bpm and O2 was 97 on room air. Pt reported 6-8/10 shortness of breath on modified scale  for dyspnea. Pt able to resume after rest and ambulate an additional 205 feet. Pt requried PT assist to avoid falling on 8 occasions  due to tripping over toes or "knee giving out" per pt.  Pt ambulated a total of 575 feet in 6 minute walk and reported 4/10 SOB on modified scale for dyspena and 13/20 RPE on Borg's perceived exertion and pain scale at the end of the walk. During the 6 minute walk test, patient's HR increased to 88 BPM and O2 saturation decreased to 96%. Based on the Short Physical Performance Battery, patient has a frailty rating of 7/12 with </= 5/12 considered frail.  Visit Diagnosis: Difficulty in walking, not elsewhere classified     Problem List Patient Active Problem List   Diagnosis Date Noted  . Educated about COVID-19 virus infection 09/09/2019  . Chronic obstructive pulmonary disease ([place]) 09/09/2018  . 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 Paroxysmal atrial fibrillation ([place]) 01/28/2018  . Near syncope   . Aortic stenosis 12/30/2017  . Postural dizziness with presyncope 12/30/2017  . Bilateral carotid artery stenosis 11/01/2016  . Aortic valve stenosis 11/01/2016  . Morbid obesity ([place]) 11/29/2015  . Trigeminy 05/23/2013  . Subclavian artery stenosis, right ([place]) 12/03/2012  . Diabetes mellitus, type 2 ([place]) 08/26/2012  . Vitamin D deficiency 08/26/2012  . Hypothyroidism 07/12/2011  . Hyperlipidemia associated with type 2 diabetes mellitus ([place]) 11/10/2009  . CORONARY ATHEROSCLEROSIS NATIVE CORONARY ARTERY 11/10/2009  . Hypertension associated with diabetes ([place]) 10/26/2009   Janiesha Diehl C. Brandilyn Nanninga PT, DPT 01/01/20 2:47 PM   [facility] [address] [place], [place], [address] Phone: [phone]   Fax:  [phone]  Name: TAY WHITWELL MRN: [id number] Date of Birth: 18-Jun-1945

## 2020-01-02 ENCOUNTER — Encounter: Payer: Self-pay | Admitting: Physician Assistant

## 2020-01-07 ENCOUNTER — Other Ambulatory Visit: Payer: Self-pay

## 2020-01-07 ENCOUNTER — Encounter: Payer: Self-pay | Admitting: Surgery

## 2020-01-07 ENCOUNTER — Institutional Professional Consult (permissible substitution): Payer: Medicare Other | Admitting: Surgery

## 2020-01-07 VITALS — BP 138/73 | HR 65 | Temp 97.7°F | Ht 67.5 in | Wt 242.0 lb

## 2020-01-07 DIAGNOSIS — I350 Nonrheumatic aortic (valve) stenosis: Secondary | ICD-10-CM

## 2020-01-07 NOTE — Progress Notes (Signed)
Patient ID: Matthew May, male   DOB: May 27, 1946, 74 y.o.   MRN: [id number]  [facility] SURGERY CONSULTATION REPORT  Referring Provider is Minus Breeding, MD Primary Cardiologist is Minus Breeding, MD PCP is Sharion Balloon, FNP  Chief Complaint  Patient presents with  . Aortic Stenosis    Consult for TAVR, review all testing    HPI:  The patient is a 74 year old gentleman with a history of hypertension, hyperlipidemia, type 2 diabetes, smoking and COPD, right subclavian artery stenosis, moderate left internal carotid artery stenosis, atrial fibrillation on Eliquis, and coronary artery disease status post coronary artery bypass graft surgery x4 by me in 2011 after a non-ST segment elevation MI.  He had a left internal mammary graft to the LAD, saphenous vein graft to the OM, and sequential saphenous vein graft to the PDA and PLA.  He has done well since his surgery but over the past 12 months has developed progressive exertional fatigue, shortness of breath, and chest discomfort.  This is now occurring with low-level activity such as walking on level ground or in his house.  He cannot do yard work anymore.  He has had some episodes of dizziness but no syncope.  He has bilateral lower extremity edema which has been chronic in the left leg and does not change.  The edema in his right leg goes down at night and worsens during the day.  He had an echocardiogram in August 2019 showing moderate aortic stenosis with a mean gradient of 27 mmHg.  Echocardiogram on 09/16/2019 showed progression to severe aortic stenosis with a mean gradient of 52 mmHg and a dimensionless index of 0.22.  Aortic valve area was 0.62 cm.  Left ventricular ejection fraction was 55 to 60% with grade 1 diastolic dysfunction.  The patient is here today with his wife.  He is retired since 2011.  He remained active until the past year but now is mostly  sedentary due to his symptoms.  Past Medical History:  Diagnosis Date  . Aortic stenosis   . Atrial fibrillation ([place])   . CAD (coronary artery disease)    a. s/p NSTEMI 4/11 => s/p CABG (L-LAD, S-OM2, S-PDA/PL);  b.  ETT-Myoview 6/14:  normal study, no ischemia, EF 67%  . Carotid stenosis    Carotid U/S 1/28:  RICA 7-86%, LICA 76-72%; right vertebral flow retrograde suggestive of steel-consider PV consult  . COPD (chronic obstructive pulmonary disease) ([place])   . Diabetes mellitus without complication ([place])   . HLD (hyperlipidemia)   . HTN (hypertension)   . Obesity   . Renal insufficiency   . Subclavian artery stenosis, right ([place])    based upon carotid U/S done 11/2012    Past Surgical History:  Procedure Laterality Date  . CORONARY ARTERY BYPASS GRAFT     2011, LIMA to LAD coronary artery, SVG to OM2 branch of lect circumflex coronary artery, and a sequential SVG to psot descening to posterolateral branches to RCA  . endoscopic vein harvesting     right leg   . HERNIA REPAIR    . RIGHT/LEFT HEART CATH AND CORONARY/GRAFT ANGIOGRAPHY N/A 12/31/2017   Procedure: RIGHT/LEFT HEART CATH AND CORONARY/GRAFT ANGIOGRAPHY;  Surgeon: Leonie Man, MD;  Location: [facility];  Service: Cardiovascular;  Laterality: N/A;  . RIGHT/LEFT HEART CATH AND CORONARY/GRAFT ANGIOGRAPHY N/A 12/30/2019   Procedure: RIGHT/LEFT HEART CATH AND CORONARY/GRAFT ANGIOGRAPHY;  Surgeon: Belva Crome, MD;  Location: [facility];  Service: Cardiovascular;  Laterality: N/A;  . TONSILLECTOMY      Family History  Problem Relation Age of Onset  . Heart disease Father   . Heart attack Father   . Mental illness Mother   . Mental illness Sister   . Diabetes Brother   . Depression Maternal Aunt     Social History   Socioeconomic History  . Marital status: Married    Spouse name: Lucita Ferrara  . Number of children: 3  . Years of education: 8  . Highest education level: 8th grade  Occupational  History  . Occupation: Retired    Comment: scrap yard  Tobacco Use  . Smoking status: Former Smoker    Quit date: 07/11/2009    Years since quitting: 10.4  . Smokeless tobacco: Never Used  . Tobacco comment: smoked about 10 cig/day; used to smoke 2 ppd for many years   Vaping Use  . Vaping Use: Never used  Substance and Sexual Activity  . Alcohol use: No    Alcohol/week: 0.0 standard drinks  . Drug use: No  . Sexual activity: Never  Other Topics Concern  . Not on file  Social History Narrative   Lives at home with his wife. He is retired but his wife continues to work daily. They have adult children that live locally and grandchildren that they spend time with regularly. He is active around his home and yard.    Social Determinants of Health   Financial Resource Strain:   . Difficulty of Paying Living Expenses:   Food Insecurity:   . Worried About Charity fundraiser in the Last Year:   . Arboriculturist in the Last Year:   Transportation Needs:   . Film/video editor (Medical):   Marland Kitchen Lack of Transportation (Non-Medical):   Physical Activity:   . Days of Exercise per Week:   . Minutes of Exercise per Session:   Stress:   . Feeling of Stress :   Social Connections:   . Frequency of Communication with Friends and Family:   . Frequency of Social Gatherings with Friends and Family:   . Attends Religious Services:   . Active Member of Clubs or Organizations:   . Attends Archivist Meetings:   Marland Kitchen Marital Status:   Intimate Partner Violence:   . Fear of Current or Ex-Partner:   . Emotionally Abused:   Marland Kitchen Physically Abused:   . Sexually Abused:     Current Outpatient Medications  Medication Sig Dispense Refill  . albuterol (PROVENTIL HFA;VENTOLIN HFA) 108 (90 Base) MCG/ACT inhaler Inhale 2 puffs into the lungs every 6 (six) hours as needed for wheezing or shortness of breath. 1 Inhaler 0  . apixaban (ELIQUIS) 5 MG TABS tablet Take 1 tablet (5 mg total) by mouth 2  (two) times daily. 60 tablet 1  . atorvastatin (LIPITOR) 40 MG tablet TAKE 1 TABLET BY MOUTH  DAILY (Patient taking differently: Take 40 mg by mouth daily. ) 90 tablet 3  . cholecalciferol (VITAMIN D3) 25 MCG (1000 UT) tablet Take 1,000 Units by mouth daily.    . colchicine 0.6 MG tablet Take 1 tablet (0.6 mg total) by mouth daily. 2 tablets now and then 1 tablet in one hour. Then one tablet daily until symptoms resolve. (Patient taking differently: Take 0.6-1.2 mg by mouth daily as needed (gout flare ups). ) 30 tablet 0  . fluticasone furoate-vilanterol (BREO ELLIPTA) 100-25 MCG/INH AEPB Inhale  1 puff by mouth once daily (Patient taking differently: Inhale 1 puff into the lungs daily. ) 60 each 0  . glimepiride (AMARYL) 2 MG tablet Take 1 tablet (2 mg total) by mouth daily with breakfast. 90 tablet 1  . glucose blood (ONETOUCH VERIO) test strip Test BS daily Dx E11.9 100 strip 3  . Lancets (ONETOUCH DELICA PLUS [id number]) MISC USE DAILY 100 each 3  . levothyroxine (SYNTHROID) 150 MCG tablet TAKE 1 TABLET BY MOUTH  DAILY (Patient taking differently: Take 150 mcg by mouth daily before breakfast. ) 90 tablet 3  . metFORMIN (GLUCOPHAGE) 1000 MG tablet Take 1 tablet (1,000 mg total) by mouth 2 (two) times daily. 60 tablet 0  . Misc Natural Products (TART CHERRY ADVANCED PO) Take 1 tablet by mouth daily.     . nitroGLYCERIN (NITROSTAT) 0.4 MG SL tablet DISSOLVE ONE TABLET UNDER THE TONGUE EVERY 5 MINUTES AS NEEDED FOR CHEST PAIN.  DO NOT EXCEED A TOTAL OF 3 DOSES IN 15 MINUTES 25 tablet 0  . potassium chloride SA (KLOR-CON) 20 MEQ tablet TAKE 1 TABLET BY MOUTH  DAILY 90 tablet 3  . torsemide (DEMADEX) 20 MG tablet Take 2 tablets (40 mg total) by mouth daily. 180 tablet 3  . valsartan (DIOVAN) 40 MG tablet TAKE ONE-HALF TABLET BY  MOUTH DAILY (Patient taking differently: Take 20 mg by mouth daily. ) 45 tablet 1  . metoprolol tartrate (LOPRESSOR) 50 MG tablet Take as directed prior to 01/01/20 CT scans  (Patient not taking: Reported on 01/07/2020) 1 tablet 0   No current facility-administered medications for this visit.    Allergies  Allergen Reactions  . Prednisone Other (See Comments)    Pt states "sugar went to 580"  . Januvia [Sitagliptin] Nausea And Vomiting  . Jardiance [Empagliflozin] Rash  . Lisinopril Cough      Review of Systems:   General:  normal appetite, + decreased energy, no weight gain, no weight loss, no fever  Cardiac:  + chest pain with exertion, + chest pain at rest, +SOB with mild exertion, + resting SOB, no PND, + orthopnea, no palpitations, + arrhythmia, + atrial fibrillation, + LE edema, + dizzy spells, no syncope  Respiratory:  + shortness of breath, no home oxygen, no productive cough, no dry cough, no bronchitis, + wheezing, no hemoptysis, no asthma, no pain with inspiration or cough, no sleep apnea, no CPAP at night  GI:   no difficulty swallowing, no reflux, no frequent heartburn, no hiatal hernia, no abdominal pain, no constipation, no diarrhea, no hematochezia, no hematemesis, no melena  GU:   no dysuria,  no frequency, no urinary tract infection, no hematuria, no enlarged prostate, no kidney stones, no kidney disease  Vascular:  no pain suggestive of claudication, + pain in feet, no leg cramps, no varicose veins, no DVT, no non-healing foot ulcer  Neuro:   no stroke, no TIA's, no seizures, no headaches, no temporary blindness one eye,  no slurred speech, no peripheral neuropathy, no chronic pain, no instability of gait, no memory/cognitive dysfunction  Musculoskeletal: no arthritis, no joint swelling, no myalgias, no difficulty walking, normal mobility   Skin:   no rash, no itching, no skin infections, no pressure sores or ulcerations  Psych:   no anxiety, no depression, no nervousness, no unusual recent stress  Eyes:   no blurry vision, no floaters, no recent vision changes, does not wear glasses or contacts  ENT:   no hearing loss, no loose or painful  teeth, + dentures Hematologic:  + easy bruising, no abnormal bleeding, no clotting disorder, no frequent epistaxis  Endocrine:  + diabetes, does check CBG's at home           Physical Exam:   BP 138/73   Pulse 65   Temp 97.7 F (36.5 C) (Skin)   Ht 5' 7.5" (1.715 m)   Wt 242 lb (109.8 kg)   SpO2 95% Comment: RA  BMI 37.34 kg/m   General:  Elderly but  well-appearing  HEENT:  Unremarkable, NCAT, PERLA, EOMI  Neck:   no JVD, no bruits, no adenopathy   Chest:   clear to auscultation, symmetrical breath sounds, no wheezes, no rhonchi   CV:   RRR, grade lll/VI crescendo/decrescendo murmur heard best at LLSB,  no diastolic murmur  Abdomen:  soft, non-tender, no masses   Extremities:  warm, well-perfused, dp and pt pulses not palpable at ankle and may be due to edema, moderate bilateral LE edema with chronic venous stasis changes of both lower legs.  Rectal/GU  Deferred  Neuro:   Grossly non-focal and symmetrical throughout  Skin:   Clean and dry, no rashes, no breakdown   Diagnostic Tests:  ECHOCARDIOGRAM REPORT       Patient Name:  ANDRES VEST Date of Exam: 09/16/2019  Medical Rec #: [id number]    Height:    66.5 in  Accession #:  [id number]    Weight:    242.0 lb  Date of Birth: 04-03-1946     BSA:     2.181 m  Patient Age:  53 years     BP:      154/82 mmHg  Patient Gender: M        HR:      81 bpm.  Exam Location: [facility]   Procedure: 2D Echo, Cardiac Doppler and Color Doppler   Indications:  I35.0 (ICD-10-CM) - Nonrheumatic aortic valve stenosis    History:    Patient has prior history of Echocardiogram examinations,  most         recent 01/08/2018. CAD, COPD, Arrythmias:Trigeminy and  Atrial         Fibrillation; Risk Factors:Hypertension, Diabetes and         Dyslipidemia. Aortic valve stenosis,Subclavian artery  stenosis,         right.    Sonographer:   Alvino Chapel RCS  Referring Phys: Will    1. Left ventricular ejection fraction, by estimation, is 55 to 60%. The  left ventricle has normal function. The left ventricle has no regional  wall motion abnormalities. There is mild left ventricular hypertrophy.  Left ventricular diastolic parameters  are consistent with Grade I diastolic dysfunction (impaired relaxation).  2. Right ventricular systolic function is normal. The right ventricular  size is normal. Tricuspid regurgitation signal is inadequate for assessing  PA pressure.  3. Left atrial size was mildly dilated.  4. The mitral valve is grossly normal with mild annular calcification.  Trivial mitral valve regurgitation.  5. The aortic valve is tricuspid, moderately calcified with severely  reduced cusp excursion. Aortic valve regurgitation is not visualized.  Severe aortic valve stenosis. Aortic valve area, by VTI measures 0.62 cm.  Aortic valve mean gradient measures  52.0 mmHg. Aortic valve Vmax measures 4.61 m/s. Dimentionless index 0.22.  There has been significant progression in comparison to prior stuy in  2019.  6. The inferior vena cava is dilated in size with >50% respiratory  variability, suggesting right atrial pressure of 8 mmHg.   FINDINGS  Left Ventricle: Left ventricular ejection fraction, by estimation, is 55  to 60%. The left ventricle has normal function. The left ventricle has no  regional wall motion abnormalities. The left ventricular internal cavity  size was normal in size. There is  mild left ventricular hypertrophy. Left ventricular diastolic parameters  are consistent with Grade I diastolic dysfunction (impaired relaxation).   Right Ventricle: The right ventricular size is normal. No increase in  right ventricular wall thickness. Right ventricular systolic function is  normal. Tricuspid regurgitation signal is inadequate for assessing PA  pressure.   Left  Atrium: Left atrial size was mildly dilated.   Right Atrium: Right atrial size was normal in size.   Pericardium: There is no evidence of pericardial effusion. Presence of  pericardial fat pad.   Mitral Valve: The mitral valve is grossly normal. Mild mitral annular  calcification. Trivial mitral valve regurgitation.   Tricuspid Valve: The tricuspid valve is grossly normal. Tricuspid valve  regurgitation is trivial.   Aortic Valve: The aortic valve is tricuspid. Aortic valve regurgitation is  not visualized. Severe aortic stenosis is present. Moderate aortic valve  annular calcification. There is moderate calcification of the aortic  valve. Aortic valve mean gradient  measures 52.0 mmHg. Aortic valve peak gradient measures 85.0 mmHg. Aortic  valve area, by VTI measures 0.62 cm.   Pulmonic Valve: The pulmonic valve was grossly normal. Pulmonic valve  regurgitation is trivial.   Aorta: The aortic root is normal in size and structure.   Venous: The inferior vena cava is dilated in size with greater than 50%  respiratory variability, suggesting right atrial pressure of 8 mmHg.   IAS/Shunts: No atrial level shunt detected by color flow Doppler.     LEFT VENTRICLE  PLAX 2D  LVIDd:     4.49 cm Diastology  LVIDs:     2.45 cm LV e' lateral:  6.64 cm/s  LV PW:     1.26 cm LV E/e' lateral: 19.7  LV IVS:    1.32 cm LV e' medial:  6.31 cm/s  LVOT diam:   1.90 cm LV E/e' medial: 20.8  LV SV:     65  LV SV Index:  30  LVOT Area:   2.84 cm     RIGHT VENTRICLE  RV Mid diam:  3.27 cm  RV S prime:   6.58 cm/s  TAPSE (M-mode): 1.5 cm   LEFT ATRIUM       Index  LA diam:    3.80 cm 1.74 cm/m  LA Vol (A2C):  68.5 ml 31.41 ml/m  LA Vol (A4C):  70.6 ml 32.37 ml/m  LA Biplane Vol: 69.5 ml 31.87 ml/m  AORTIC VALVE  AV Area (Vmax):  0.54 cm  AV Area (Vmean):  0.53 cm  AV Area (VTI):   0.62 cm  AV Vmax:      461.00  cm/s  AV Vmean:     336.000 cm/s  AV VTI:      1.040 m  AV Peak Grad:   85.0 mmHg  AV Mean Grad:   52.0 mmHg  LVOT Vmax:     87.50 cm/s  LVOT Vmean:    62.400 cm/s  LVOT VTI:     0.228 m  LVOT/AV VTI ratio: 0.22    AORTA  Ao Root diam: 4.00 cm   MITRAL VALVE  MV Area (PHT): 2.39 cm   SHUNTS  MV Decel Time: 318  msec   Systemic VTI: 0.23 m  MV E velocity: 131.00 cm/s Systemic Diam: 1.90 cm  MV A velocity: 151.00 cm/s  MV E/A ratio: 0.87   Rozann Lesches MD  Electronically signed by Rozann Lesches MD  Signature Date/Time: 09/16/2019/12:28:57 PM     Physicians  Panel Physicians Referring Physician Case Authorizing Physician  Belva Crome, MD (Primary)    Procedures  RIGHT/LEFT HEART CATH AND CORONARY/GRAFT ANGIOGRAPHY  Conclusion   Severe calcific aortic stenosis.  Peak to peak gradient 69 mmHg with mean gradient 59 mmHg.  Aortic valve area 0.78 cm.  Relatively high output state documented by both Fick (7.2 L/min) and thermal dilution (7.99 L/min)  Occluded saphenous vein graft to the circumflex.  Patent sequential saphenous vein graft to the PDA and PL.  Widely patent LIMA to LAD.  Widely patent native left main.  Patent LAD with competitive flow into the distal LAD from LIMA.  Severe diffuse disease in the mid to distal circumflex with functional total occlusion and collateralization of the second obtuse marginal from right to left collaterals.  Total occlusion mid RCA  Normal pulmonary wedge pressure and mildly elevated pulmonary artery pressures (mean pressure 23 mmHg).Marland Kitchen  RECOMMENDATIONS   Resume Eliquis a.m. 12/31/2019.  Forward data to the TAVR valve clinic, Dr. Burt Knack, and Dr. Arvid Right. Surgeon Notes    12/30/2019 9:10 AM CV Procedure signed by Belva Crome, MD  Indications  Severe aortic stenosis [I35.0 (ICD-10-CM)]  Coronary artery disease of bypass graft of native heart with stable angina  pectoris ([place]) [I25.708 (ICD-10-CM)]  Procedural Details  Technical Details The right femoral was sterilely prepped and draped. 1% Xylocaine local infiltration was then given for local analgesia. Sedation was administered in the form of intravenous Versed and fentanyl.. Using real-time vascular ultrasound, a Seldinger needle was used to perform an anterior wall common femoral vein and later artery stick. A VUS image was saved for the permanent record.  In sequential fashion, A 7 French sheath was inserted into the right femoral vein using the modified Seldinger technique.  Next a 5 [place] arterial sheath was inserted into the right femoral artery.  Right heart catheterization was performed via the 7 French venous sheath using a 7 [place] Swan-Ganz.  Pressure recordings were made in each right heart chamber.  A capillary wedge pressure was documented.  Thermodilution cardiac outputs were recorded.  Fick cardiac output was recorded based upon the mixed venous O2 saturation obtained from the main pulmonary artery.  Next, bypass graft angiography, right coronary angiography, and aortic valve pressure gradient was performed using a B2 5 [place] multipurpose catheter.  A 5 [place] JL4 was used for left coronary angiography.  A 5 French IMA catheter was used for left internal mammary angiography. Left ventriculography was not performed.   During this procedure the patient is administered a total of Versed 2 mg and Fentanyl 25 mcg to achieve and maintain moderate conscious sedation.  The patient's heart rate, blood pressure, and oxygen saturation are monitored continuously during the procedure. The period of conscious sedation is 40 minutes, of which I was present face-to-face 100% of this time. Estimated blood loss <50 mL.   During this procedure medications were administered to achieve and maintain moderate conscious sedation while the patient's heart rate, blood pressure, and oxygen saturation were continuously  monitored and I was present face-to-face 100% of this time.  Medications (Filter: Administrations occurring from [phone] to 0847 on 12/30/19) (important) Continuous medications are totaled by the  amount administered until 12/30/19 0847.  midazolam (VERSED) injection (mg) Total dose:  2 mg Date/Time  Rate/Dose/Volume Action  12/30/19 0749  1 mg Given  0758  1 mg Given    fentaNYL (SUBLIMAZE) injection (mcg) Total dose:  25 mcg Date/Time  Rate/Dose/Volume Action  12/30/19 0749  25 mcg Given    Heparin (Porcine) in NaCl 1000-0.9 UT/500ML-% SOLN (mL) Total volume:  1,000 mL Date/Time  Rate/Dose/Volume Action  12/30/19 0754  500 mL Given  0754  500 mL Given    lidocaine (PF) (XYLOCAINE) 1 % injection (mL) Total volume:  20 mL Date/Time  Rate/Dose/Volume Action  12/30/19 0754  20 mL Given    Sedation Time  Sedation Time Physician-1: 48 minutes 8 seconds  Contrast  * No intraprocedure medications in log *  Radiation/Fluoro  Fluoro time: 11.8 (min) DAP: 31926 (mGycm2) Cumulative Air Kerma: 516 (mGy)  Coronary Findings  Diagnostic Dominance: Right Left Anterior Descending  Mid LAD lesion is 100% stenosed. The lesion is chronically occluded.  Lateral First Diagonal Branch  Lat 1st Diag lesion is 90% stenosed. The lesion is located at the bifurcation and discrete.  Second Diagonal Branch  Vessel is small in size.  Left Circumflex  Prox Cx lesion is 95% stenosed. The lesion is segmental and irregular.  Mid Cx lesion is 100% stenosed. The lesion is chronically occluded.  First Obtuse Marginal Branch  Vessel is small in size. There is moderate disease in the vessel.  Second Obtuse Marginal Branch  There is moderate disease in the vessel.  Right Coronary Artery  Vessel is moderate in size.  Prox RCA to Dist RCA lesion is 100% stenosed. The lesion is chronically occluded.  Sequential Jump Graft Graft To RPDA, 1st RPL  Seq SVG- RPDA-RPL and is large. The graft exhibits no  disease.  LIMA LIMA Graft To Mid LAD  LIMA and is normal in caliber. The graft exhibits no disease.  Saphenous Graft To Dist Cx  SVG and is normal in caliber. Flush occluded  Origin to Prox Graft lesion is 100% stenosed. The lesion is chronically occluded.  Intervention  No interventions have been documented. Right Heart  Right Heart Pressures LV EDP is normal.  Right Atrium Right atrial pressure is normal.  Left Heart  Left Ventricle The left ventricular ejection fraction is 55-65% by visual estimate.  Aortic Valve There is severe aortic valve stenosis. The aortic valve is calcified.  Coronary Diagrams  Diagnostic Dominance: Right  Intervention  Implants   No implant documentation for this case.  Syngo Images  Show images for CARDIAC CATHETERIZATION Images on Long Term Storage  Show images for [place], OBADIAH DENNARD to Procedure Log  Procedure Log    Hemo Data   Most Recent Value  Fick Cardiac Output 7.18 L/min  Fick Cardiac Output Index 3.23 (L/min)/BSA  Thermal Cardiac Output 7.99 L/min  Thermal Cardiac Output Index 3.6 (L/min)/BSA  Aortic Mean Gradient 58.78 mmHg  Aortic Peak Gradient 69 mmHg  Aortic Valve Area 0.86  Aortic Value Area Index 0.39 cm2/BSA  RA A Wave 6 mmHg  RA V Wave 7 mmHg  RA Mean 5 mmHg  RV Systolic Pressure 39 mmHg  RV Diastolic Pressure 1 mmHg  RV EDP 6 mmHg  PA Systolic Pressure 40 mmHg  PA Diastolic Pressure 13 mmHg  PA Mean 23 mmHg  PW A Wave 13 mmHg  PW V Wave 12 mmHg  PW Mean 11 mmHg  AO Systolic Pressure 751 mmHg  AO Diastolic  Pressure 56 mmHg  AO Mean 79 mmHg  LV Systolic Pressure 063 mmHg  LV Diastolic Pressure 1 mmHg  LV EDP 10 mmHg  AOp Systolic Pressure 016 mmHg  AOp Diastolic Pressure 54 mmHg  AOp Mean Pressure 77 mmHg  LVp Systolic Pressure 010 mmHg  LVp Diastolic Pressure 1 mmHg  LVp EDP Pressure 13 mmHg  TPVR Index 6.39 HRUI  TSVR Index 21.93 HRUI  PVR SVR Ratio 0.16  TPVR/TSVR Ratio 0.29   CLINICAL  DATA:  74 year old male with a history of carotid stenosis follow-up  EXAM: BILATERAL CAROTID DUPLEX ULTRASOUND  TECHNIQUE: Pearline Cables scale imaging, color Doppler and duplex ultrasound were performed of bilateral carotid and vertebral arteries in the neck.  COMPARISON:  11/06/2016  FINDINGS: Criteria: Quantification of carotid stenosis is based on velocity parameters that correlate the residual internal carotid diameter with NASCET-based stenosis levels, using the diameter of the distal internal carotid lumen as the denominator for stenosis measurement.  The following velocity measurements were obtained:  RIGHT  ICA:  Systolic 932 cm/sec, Diastolic 15 cm/sec  CCA:  355 cm/sec  SYSTOLIC ICA/CCA RATIO:  1.7  ECA:  122 cm/sec  LEFT  ICA:  Systolic 732 cm/sec, Diastolic 26 cm/sec  CCA:  91 cm/sec  SYSTOLIC ICA/CCA RATIO:  1.0  ECA:  122 cm/sec  Right Brachial SBP: Not acquired  Left Brachial SBP: Not acquired  RIGHT CAROTID ARTERY: No significant calcifications of the right common carotid artery. Intermediate waveform maintained. Moderate heterogeneous and partially calcified plaque at the right carotid bifurcation. No significant lumen shadowing. Low resistance waveform of the right ICA. No significant tortuosity.  RIGHT VERTEBRAL ARTERY: Reversal of the right vertebral artery in systole.  LEFT CAROTID ARTERY: No significant calcifications of the left common carotid artery. Intermediate waveform maintained. Moderate heterogeneous and partially calcified plaque at the left carotid bifurcation. No significant lumen shadowing. Low resistance waveform of the left ICA. No significant tortuosity.  LEFT VERTEBRAL ARTERY:  Antegrade flow with low resistance waveform.  IMPRESSION: Right:  Heterogeneous and partially calcified plaque at the right carotid bifurcation, with discordant results regarding degree of stenosis by established duplex  criteria. Peak velocity suggests 50%-69% stenosis, with the ICA/ CCA ratio suggesting a lesser degree of stenosis. If establishing a more accurate degree of stenosis is required, cerebral angiogram should be considered, or as a second best test, CTA.  Left:  Color duplex indicates moderate heterogeneous and calcified plaque, with no hemodynamically significant stenosis by duplex criteria in the extracranial cerebrovascular circulation.  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  Signed,  Dulcy Fanny. Dellia Nims, RPVI  [facility]  [facility]   Electronically Signed   By: Corrie Mckusick D.O.   On: 09/16/2019 16:24  ADDENDUM REPORT: 01/01/2020 17:44  CLINICAL DATA:  Severe Aortic Stenosis.  EXAM: Cardiac TAVR CT  TECHNIQUE: The patient was scanned on a [employer]. A 120 kV retrospective scan was triggered in the descending thoracic aorta at 111 HU's. Gantry rotation speed was 250 msecs and collimation was .6 mm. No beta blockade or nitro were given. The 3D data set was reconstructed in 5% intervals of the R-R cycle. Systolic and diastolic phases were analyzed on a dedicated work station using MPR, MIP and VRT modes. The patient received 80 cc of contrast.  FINDINGS: Image quality: Excellent.  Noise artifact is: Limited.  Valve Morphology: Tricuspid aortic valve with severe calcifications. All 3 leaflets are diffusely calcified with restricted  movement in systole.  Aortic Valve Calcium score: 2887  Aortic annular dimension:  Phase assessed: 15%  Annular area: 558 cm2  Annular perimeter: 84.7  Mm  Max diameter: 29.8  Mm  Min diameter: 25.2 mm  Annular and subannular calcification: None.  Optimal coplanar projection: LAO 10 CRA 1  Coronary Artery Height above Annulus:  Left Main: 12.1 mm  Right Coronary: 18.8 mm  Sinus  of Valsalva Measurements:  Non-coronary: 36 mm  Right-coronary: 34 mm  Left-coronary: 34 mm  Sinus of Valsalva Height:  Non-coronary: 27.2 mm  Right-coronary: 23.2 mm  Left-coronary: 18.2 mm  Sinotubular Junction: 30 mm with mild to moderate calcified plaque.  Ascending Thoracic Aorta: 36 mm.  Coronary Arteries: Normal coronary origin. Right dominance. The study was performed without use of NTG and is insufficient for plaque evaluation. Please refer to recent cardiac catheterization for coronary assessment. The patient is s/p CABG. There is a patent LIMA to mid LAD. There is a patent SVG to the distal RCA. There is an occluded SVG to the reported LCX. There are severe 3-vessel calcifications noted.  Cardiac Morphology:  Right Atrium: Right atrial size is within normal limits.  Right Ventricle: The right ventricular cavity is within normal limits.  Left Atrium: Left atrial size is normal in size with no left atrial appendage filling defect.  Left Ventricle: The ventricular cavity size is within normal limits. There is severe asymmetric basal septal hypertrophy up to 19 mm. There is no mitral valve SAM to suggest LVOT obstruction. There are no stigmata of prior infarction. There is no abnormal filling defect. LVEF=73%. There is abnormal septal motion consistent with postoperative septum.  Pulmonary arteries: Normal in size without proximal filling defect.  Pulmonary veins: Normal pulmonary venous drainage.  Pericardium: Normal thickness with no significant effusion or calcium present.  Mitral Valve: The mitral valve is normal structure without significant calcification.  Extra-cardiac findings: See attached radiology report for non-cardiac structures.  IMPRESSION: 1. Annular measurements appropriate for 29 mm Edwards S3 TAVR.  2. No significant annular or subannular calcifications.  3. Sufficient coronary to annulus distance.  4.  Optimal Fluoroscopic Angle for Delivery: LAO 10 CRA 1  5. Severe asymmetric hypertrophy of the basal septum up to 19 mm.  [place]ells T. Audie Box, MD   Electronically Signed   By: Eleonore Chiquito   On: 01/01/2020 17:44   Addended by Geralynn Rile, MD on 01/01/2020 5:46 PM  Study Result  Addenda  ADDENDUM REPORT: 01/01/2020 17:44  CLINICAL DATA:  Severe Aortic Stenosis.  EXAM: Cardiac TAVR CT  TECHNIQUE: The patient was scanned on a [employer]. A 120 kV retrospective scan was triggered in the descending thoracic aorta at 111 HU's. Gantry rotation speed was 250 msecs and collimation was .6 mm. No beta blockade or nitro were given. The 3D data set was reconstructed in 5% intervals of the R-R cycle. Systolic and diastolic phases were analyzed on a dedicated work station using MPR, MIP and VRT modes. The patient received 80 cc of contrast.  FINDINGS: Image quality: Excellent.  Noise artifact is: Limited.  Valve Morphology: Tricuspid aortic valve with severe calcifications. All 3 leaflets are diffusely calcified with restricted movement in systole.  Aortic Valve Calcium score: 2887  Aortic annular dimension:  Phase assessed: 15%  Annular area: 558 cm2  Annular perimeter: 84.7  Mm  Max diameter: 29.8  Mm  Min diameter: 25.2 mm  Annular and subannular calcification: None.  Optimal coplanar projection: LAO 10  CRA 1  Coronary Artery Height above Annulus:  Left Main: 12.1 mm  Right Coronary: 18.8 mm  Sinus of Valsalva Measurements:  Non-coronary: 36 mm  Right-coronary: 34 mm  Left-coronary: 34 mm  Sinus of Valsalva Height:  Non-coronary: 27.2 mm  Right-coronary: 23.2 mm  Left-coronary: 18.2 mm  Sinotubular Junction: 30 mm with mild to moderate calcified plaque.  Ascending Thoracic Aorta: 36 mm.  Coronary Arteries: Normal coronary origin. Right dominance. The study was performed without use of NTG  and is insufficient for plaque evaluation. Please refer to recent cardiac catheterization for coronary assessment. The patient is s/p CABG. There is a patent LIMA to mid LAD. There is a patent SVG to the distal RCA. There is an occluded SVG to the reported LCX. There are severe 3-vessel calcifications noted.  Cardiac Morphology:  Right Atrium: Right atrial size is within normal limits.  Right Ventricle: The right ventricular cavity is within normal limits.  Left Atrium: Left atrial size is normal in size with no left atrial appendage filling defect.  Left Ventricle: The ventricular cavity size is within normal limits. There is severe asymmetric basal septal hypertrophy up to 19 mm. There is no mitral valve SAM to suggest LVOT obstruction. There are no stigmata of prior infarction. There is no abnormal filling defect. LVEF=73%. There is abnormal septal motion consistent with postoperative septum.  Pulmonary arteries: Normal in size without proximal filling defect.  Pulmonary veins: Normal pulmonary venous drainage.  Pericardium: Normal thickness with no significant effusion or calcium present.  Mitral Valve: The mitral valve is normal structure without significant calcification.  Extra-cardiac findings: See attached radiology report for non-cardiac structures.  IMPRESSION: 1. Annular measurements appropriate for 29 mm Edwards S3 TAVR.  2. No significant annular or subannular calcifications.  3. Sufficient coronary to annulus distance.  4. Optimal Fluoroscopic Angle for Delivery: LAO 10 CRA 1  5. Severe asymmetric hypertrophy of the basal septum up to 19 mm.  [place] T. Audie Box, MD   Electronically Signed   By: Eleonore Chiquito   On: 01/01/2020 17:44   Signed by Geralynn Rile, MD on 01/01/2020 5:46 PM  Narrative & Impression  EXAM: OVER-READ INTERPRETATION  CT CHEST  The following report is an over-read performed by radiologist  Dr. Vinnie Langton of [facility], [place] on 01/01/2020. This over-read does not include interpretation of cardiac or coronary anatomy or pathology. The coronary calcium score/coronary CTA interpretation by the cardiologist is attached.  COMPARISON:  CTA chest 09/29/2009.  FINDINGS: Extracardiac findings will be described separately under dictation for contemporaneously obtained CTA chest, abdomen and pelvis.  IMPRESSION: Please see separate dictation for contemporaneously obtained CTA chest, abdomen and pelvis 01/01/2020 for full description of relevant extracardiac findings.  Electronically Signed: By: Vinnie Langton M.D. On: 01/01/2020 11:54     CLINICAL DATA:  74 year old male with history of severe aortic stenosis. Preprocedural study prior to potential transcatheter aortic valve replacement (TAVR) procedure.  EXAM: CT ANGIOGRAPHY CHEST, ABDOMEN AND PELVIS  TECHNIQUE: Non-contrast CT of the chest was initially obtained.  Multidetector CT imaging through the chest, abdomen and pelvis was performed using the standard protocol during bolus administration of intravenous contrast. Multiplanar reconstructed images and MIPs were obtained and reviewed to evaluate the vascular anatomy.  CONTRAST:  197mL OMNIPAQUE IOHEXOL 350 MG/ML SOLN  COMPARISON:  Chest CT 09/29/2009. CT the abdomen and pelvis 01/12/2008.  FINDINGS: CTA CHEST FINDINGS  Cardiovascular: Heart size is normal. There is no significant pericardial fluid, thickening or pericardial  calcification. There is aortic atherosclerosis, as well as atherosclerosis of the great vessels of the mediastinum and the coronary arteries, including calcified atherosclerotic plaque in the left main, left anterior descending, left circumflex and right coronary arteries. Severe thickening calcification of the aortic valve. Status post median sternotomy for CABG including LIMA to the LAD.  Mediastinum/Lymph  Nodes: No pathologically enlarged mediastinal or hilar lymph nodes. Esophagus is unremarkable in appearance. No axillary lymphadenopathy.  Lungs/Pleura: No suspicious appearing pulmonary nodules or masses are noted. No acute consolidative airspace disease. No pleural effusions. Scattered mild linear areas of scarring are noted in the lungs bilaterally.  Musculoskeletal/Soft Tissues: Median sternotomy wires. There are no aggressive appearing lytic or blastic lesions noted in the visualized portions of the skeleton.  CTA ABDOMEN AND PELVIS FINDINGS  Hepatobiliary: Diffuse low attenuation throughout the visualized hepatic parenchyma, indicative of hepatic steatosis. No suspicious cystic or solid hepatic lesions. No intra or extrahepatic biliary ductal dilatation. Gallbladder is normal in appearance.  Pancreas: No pancreatic mass. No pancreatic ductal dilatation. No pancreatic or peripancreatic fluid collections or inflammatory changes.  Spleen: Unremarkable.  Adrenals/Urinary Tract: Bilateral kidneys and bilateral adrenal glands are normal in appearance. No hydroureteronephrosis. Urinary bladder is normal in appearance.  Stomach/Bowel: Normal appearance of the stomach. No pathologic dilatation of small bowel or colon. Numerous colonic diverticulae are noted, particularly in the descending colon and sigmoid colon, without surrounding inflammatory changes to suggest an acute diverticulitis at this time. The appendix is not confidently identified and may be surgically absent. Regardless, there are no inflammatory changes noted adjacent to the cecum to suggest the presence of an acute appendicitis at this time.  Vascular/Lymphatic: Aortic atherosclerosis with fusiform ectasia of the infrarenal abdominal aorta which measures up to 2.8 cm in diameter. Vascular findings and measurements pertinent to potential TAVR procedure, as detailed above. No lymphadenopathy noted in  the abdomen or pelvis.  Reproductive: Prostate gland and seminal vesicles are unremarkable in appearance.  Other: No significant volume of ascites.  No pneumoperitoneum.  Musculoskeletal: There are no aggressive appearing lytic or blastic lesions noted in the visualized portions of the skeleton.  VASCULAR MEASUREMENTS PERTINENT TO TAVR:  AORTA:  Minimal Aortic Diameter-18 x 14 mm  Severity of Aortic Calcification-moderate  RIGHT PELVIS:  Right Common Iliac Artery -  Minimal Diameter-9.0 x 8.4 mm  Tortuosity-mild  Calcification-moderate  Right External Iliac Artery -  Minimal Diameter-8.6 x 8.1 mm  Tortuosity-severe  Calcification-none  Right Common Femoral Artery -  Minimal Diameter-8.2 x 5.9 mm  Tortuosity-mild  Calcification-moderate  LEFT PELVIS:  Left Common Iliac Artery -  Minimal Diameter-10.1 x 9.4 mm  Tortuosity-moderate  Calcification-mild  Left External Iliac Artery -  Minimal Diameter-7.8 x 8.0 mm  Tortuosity-severe  Calcification-none  Left Common Femoral Artery -  Minimal Diameter-7.6 x 7.2 mm  Tortuosity-mild  Calcification-moderate  Review of the MIP images confirms the above findings.  IMPRESSION: 1. Vascular findings and measurements pertinent to potential TAVR procedure, as detailed above. 2. Severe thickening calcification of the aortic valve, compatible with the reported clinical history of severe aortic stenosis. 3. Aortic atherosclerosis, in addition to left main and 3 vessel coronary artery disease. Status post median sternotomy for CABG including LIMA to the LAD. 4. Hepatic steatosis. 5. Colonic diverticulosis without evidence of acute diverticulitis at this time. 6. Additional incidental findings, as above.   Electronically Signed   By: Vinnie Langton M.D.   On: 01/01/2020 14:34   STS Risk Calculator: Isolated AVR (redo sternotomy): Risk  of  Mortality: 1.364% Renal Failure: 1.382% Permanent Stroke: 1.831% Prolonged Ventilation: 7.375% DSW Infection: 0.298% Reoperation: 3.478% Morbidity or Mortality: 11.009% Short Length of Stay: 36.542% Long Length of Stay: 4.700%  Impression:  This 74 year old gentleman has stage D, severe, symptomatic aortic stenosis with New York Heart Association class III symptoms of shortness of breath, fatigue, and chest discomfort with minimal exertion consistent with chronic diastolic congestive heart failure.  I have personally reviewed his 2D echocardiogram, cardiac catheterization, and CTA studies.  Echocardiogram shows a severely calcified trileaflet aortic valve with a mean gradient of 52 mmHg and a valve area of 0.62 cm consistent with severe to critical aortic stenosis.  Left ventricular systolic function is normal.  Cardiac catheterization shows severe three-vessel coronary disease with a patent left internal mammary graft to the LAD and a patent sequential saphenous vein graft to the PDA and PL.  The saphenous vein graft to the left circumflex is occluded with filling of the second marginal branch by right to left collaterals.  The mean gradient across the aortic valve was measured at 59 mmHg consistent with critical aortic stenosis.  I agree that aortic valve replacement is indicated in this patient for relief of his progressive symptoms and prevention of left ventricular deterioration.  Given his age and prior coronary bypass surgery I think transcatheter aortic valve replacement would be the best treatment for him.  There is no indication for any coronary revascularization.  His gated cardiac CTA shows anatomy suitable for transcatheter aortic valve replacement using a SAPIEN 3 valve.  His abdominal and pelvic CTA shows adequate pelvic vascular anatomy to allow transfemoral insertion.  The patient and his wife were counseled at length regarding treatment alternatives for management of severe  symptomatic aortic stenosis. The risks and benefits of surgical intervention has been discussed in detail. Long-term prognosis with medical therapy was discussed. Alternative approaches such as conventional surgical aortic valve replacement, transcatheter aortic valve replacement, and palliative medical therapy were compared and contrasted at length. This discussion was placed in the context of the patient's own specific clinical presentation and past medical history. All of their questions have been addressed.   Following the decision to proceed with transcatheter aortic valve replacement, a discussion was held regarding what types of management strategies would be attempted intraoperatively in the event of life-threatening complications, including whether or not the patient would be considered a candidate for the use of cardiopulmonary bypass and/or conversion to open sternotomy for attempted surgical intervention. The patient is aware of the fact that transient use of cardiopulmonary bypass may be necessary.  He has had prior bypass surgery and I do not think he is a candidate for emergent redo sternotomy to manage any intraoperative complications.  The patient has been advised of a variety of complications that might develop including but not limited to risks of death, stroke, paravalvular leak, aortic dissection or other major vascular complications, aortic annulus rupture, device embolization, cardiac rupture or perforation, mitral regurgitation, acute myocardial infarction, arrhythmia, heart block or bradycardia requiring permanent pacemaker placement, congestive heart failure, respiratory failure, renal failure, pneumonia, infection, other late complications related to structural valve deterioration or migration, or other complications that might ultimately cause a temporary or permanent loss of functional independence or other long term morbidity. The patient provides full informed consent for the  procedure as described and all questions were answered.     Plan:  He will be scheduled for transfemoral transcatheter aortic valve replacement on 01/20/2020.  I spent 60  minutes performing this consultation and > 50% of this time was spent face to face counseling and coordinating the care of this patient's severe symptomatic aortic stenosis.     [name], MD 01/07/2020

## 2020-01-12 ENCOUNTER — Other Ambulatory Visit: Payer: Self-pay

## 2020-01-12 DIAGNOSIS — R0989 Other specified symptoms and signs involving the circulatory and respiratory systems: Secondary | ICD-10-CM

## 2020-01-12 DIAGNOSIS — I350 Nonrheumatic aortic (valve) stenosis: Secondary | ICD-10-CM

## 2020-01-12 DIAGNOSIS — M79605 Pain in left leg: Secondary | ICD-10-CM

## 2020-01-12 DIAGNOSIS — M79604 Pain in right leg: Secondary | ICD-10-CM

## 2020-01-15 NOTE — Progress Notes (Signed)
Your procedure is scheduled on Tuesday August 17.  Report to [facility] Main Entrance "A" at 05:30 A.M., and check in at the Admitting office.  Call this number if you have problems the morning of surgery: [phone]  Call [phone] if you have any questions prior to your surgery date Monday-Friday 8am-4pm   Remember: Do not eat or drink after midnight the night before your surgery    Stop taking Eliquis on 8/12 (Thursday).  Stop taking Metformin on 8/15 (Sunday)  Continue taking all other medications without change through the day before surgery.   On the morning of surgery DO NOT take any medications.  Do not take any oral diabetic medications morning or surgery   As of today, STOP taking any Aspirin containing products, Aleve, Naproxen, Ibuprofen, Motrin, Advil, Goody's, BC's, all herbal medications, fish oil, and all vitamins.    The Morning of Surgery  Do not wear jewelry.  Do not wear lotions, powders, colognes, or deodorant   Men may shave face and neck.  Do not bring valuables to the hospital.  [facility] is not responsible for any belongings or valuables.  If you are a smoker, DO NOT Smoke 24 hours prior to surgery  If you wear a CPAP at night please bring your mask the morning of surgery   Remember that you must have someone to transport you home after your surgery, and remain with you for 24 hours if you are discharged the same day.   Please bring cases for contacts, glasses, hearing aids, dentures or bridgework because it cannot be worn into surgery.    Leave your suitcase in the car.  After surgery it may be brought to your room.  For patients admitted to the hospital, discharge time will be determined by your treatment team.  Patients discharged the day of surgery will not be allowed to drive home.    Special instructions:   [facility]- Preparing For Surgery  Before surgery, you can play an important role. Because skin is not sterile, your  skin needs to be as free of germs as possible. You can reduce the number of germs on your skin by washing with CHG (chlorahexidine gluconate) Soap before surgery.  CHG is an antiseptic cleaner which kills germs and bonds with the skin to continue killing germs even after washing.    Oral Hygiene is also important to reduce your risk of infection.  Remember - BRUSH YOUR TEETH THE MORNING OF SURGERY WITH YOUR REGULAR TOOTHPASTE  Please do not use if you have an allergy to CHG or antibacterial soaps. If your skin becomes reddened/irritated stop using the CHG.  Do not shave (including legs and underarms) for at least 48 hours prior to first CHG shower. It is OK to shave your face.  Please follow these instructions carefully.   1. Shower the NIGHT BEFORE SURGERY and the MORNING OF SURGERY with CHG Soap.   2. If you chose to wash your hair and body, wash as usual with your normal shampoo and body-wash/soap.  3. Rinse your hair and body thoroughly to remove the shampoo and soap.  4. Apply CHG directly to the skin (ONLY FROM THE NECK DOWN) and wash gently with a scrungie or a clean washcloth.   5. Do not use on open wounds or open sores. Avoid contact with your eyes, ears, mouth and genitals (private parts). Wash Face and genitals (private parts)  with your normal soap.   6. Wash thoroughly, paying special  attention to the area where your surgery will be performed.  7. Thoroughly rinse your body with warm water from the neck down.  8. DO NOT shower/wash with your normal soap after using and rinsing off the CHG Soap.  9. Pat yourself dry with a CLEAN TOWEL.  10. Wear CLEAN PAJAMAS to bed the night before surgery  11. Place CLEAN SHEETS on your bed the night of your first shower and DO NOT SLEEP WITH PETS.  12. Wear comfortable clothes the morning of surgery.     Day of Surgery:  Please shower the morning of surgery with the CHG soap Do not apply any deodorants/lotions. Please wear clean  clothes to the hospital/surgery center.   Remember to brush your teeth WITH YOUR REGULAR TOOTHPASTE.   Please read over the following fact sheets that you were given.

## 2020-01-16 ENCOUNTER — Ambulatory Visit (HOSPITAL_COMMUNITY)
Admission: RE | Admit: 2020-01-16 | Discharge: 2020-01-16 | Disposition: A | Discharge status: Home / Self Care | Payer: Medicare Other | Source: Ambulatory Visit | Attending: Cardiovascular Disease | Admitting: Cardiovascular Disease

## 2020-01-16 ENCOUNTER — Other Ambulatory Visit (HOSPITAL_COMMUNITY)
Admission: RE | Admit: 2020-01-16 | Discharge: 2020-01-16 | Disposition: A | Discharge status: Home / Self Care | Payer: Medicare Other | Source: Ambulatory Visit | Attending: Cardiovascular Disease | Admitting: Cardiovascular Disease

## 2020-01-16 ENCOUNTER — Encounter (HOSPITAL_COMMUNITY): Payer: Self-pay

## 2020-01-16 ENCOUNTER — Other Ambulatory Visit: Payer: Self-pay

## 2020-01-16 ENCOUNTER — Encounter (HOSPITAL_COMMUNITY)
Admission: RE | Admit: 2020-01-16 | Discharge: 2020-01-16 | Disposition: A | Discharge status: Home / Self Care | Payer: Medicare Other | Source: Ambulatory Visit | Attending: Cardiovascular Disease | Admitting: Cardiovascular Disease

## 2020-01-16 DIAGNOSIS — J449 Chronic obstructive pulmonary disease, unspecified: Secondary | ICD-10-CM | POA: Diagnosis not present

## 2020-01-16 DIAGNOSIS — I252 Old myocardial infarction: Secondary | ICD-10-CM | POA: Diagnosis not present

## 2020-01-16 DIAGNOSIS — I70208 Unspecified atherosclerosis of native arteries of extremities, other extremity: Secondary | ICD-10-CM | POA: Diagnosis not present

## 2020-01-16 DIAGNOSIS — M79605 Pain in left leg: Secondary | ICD-10-CM

## 2020-01-16 DIAGNOSIS — Z955 Presence of coronary angioplasty implant and graft: Secondary | ICD-10-CM | POA: Diagnosis not present

## 2020-01-16 DIAGNOSIS — M79604 Pain in right leg: Secondary | ICD-10-CM

## 2020-01-16 DIAGNOSIS — Z01818 Encounter for other preprocedural examination: Secondary | ICD-10-CM | POA: Diagnosis not present

## 2020-01-16 DIAGNOSIS — E1151 Type 2 diabetes mellitus with diabetic peripheral angiopathy without gangrene: Secondary | ICD-10-CM | POA: Diagnosis not present

## 2020-01-16 DIAGNOSIS — I350 Nonrheumatic aortic (valve) stenosis: Secondary | ICD-10-CM

## 2020-01-16 DIAGNOSIS — I779 Disorder of arteries and arterioles, unspecified: Secondary | ICD-10-CM | POA: Diagnosis not present

## 2020-01-16 DIAGNOSIS — R0989 Other specified symptoms and signs involving the circulatory and respiratory systems: Secondary | ICD-10-CM | POA: Diagnosis not present

## 2020-01-16 DIAGNOSIS — Z951 Presence of aortocoronary bypass graft: Secondary | ICD-10-CM | POA: Diagnosis not present

## 2020-01-16 DIAGNOSIS — I10 Essential (primary) hypertension: Secondary | ICD-10-CM | POA: Diagnosis not present

## 2020-01-16 DIAGNOSIS — I6529 Occlusion and stenosis of unspecified carotid artery: Secondary | ICD-10-CM | POA: Diagnosis not present

## 2020-01-16 DIAGNOSIS — I4891 Unspecified atrial fibrillation: Secondary | ICD-10-CM | POA: Diagnosis not present

## 2020-01-16 DIAGNOSIS — I700 Atherosclerosis of aorta: Secondary | ICD-10-CM | POA: Diagnosis not present

## 2020-01-16 DIAGNOSIS — I2510 Atherosclerotic heart disease of native coronary artery without angina pectoris: Secondary | ICD-10-CM | POA: Diagnosis not present

## 2020-01-16 DIAGNOSIS — R0602 Shortness of breath: Secondary | ICD-10-CM | POA: Diagnosis not present

## 2020-01-16 DIAGNOSIS — Z20822 Contact with and (suspected) exposure to covid-19: Secondary | ICD-10-CM | POA: Diagnosis not present

## 2020-01-16 LAB — URINALYSIS, ROUTINE W REFLEX MICROSCOPIC
Bilirubin Urine: NEGATIVE
Glucose, UA: 50 mg/dL — AB
Hgb urine dipstick: NEGATIVE
Ketones, ur: NEGATIVE mg/dL
Leukocytes,Ua: NEGATIVE
Nitrite: NEGATIVE
Protein, ur: NEGATIVE mg/dL
Specific Gravity, Urine: 1.017 (ref 1.005–1.030)
pH: 5 (ref 5.0–8.0)

## 2020-01-16 LAB — SURGICAL PCR SCREEN
MRSA, PCR: NEGATIVE
Staphylococcus aureus: NEGATIVE

## 2020-01-16 LAB — CBC
HCT: 41.2 % (ref 39.0–52.0)
Hemoglobin: 13.4 g/dL (ref 13.0–17.0)
MCH: 29.6 pg (ref 26.0–34.0)
MCHC: 32.5 g/dL (ref 30.0–36.0)
MCV: 90.9 fL (ref 80.0–100.0)
Platelets: 160 10*3/uL (ref 150–400)
RBC: 4.53 MIL/uL (ref 4.22–5.81)
RDW: 13 % (ref 11.5–15.5)
WBC: 7.3 10*3/uL (ref 4.0–10.5)
nRBC: 0 % (ref 0.0–0.2)

## 2020-01-16 LAB — BLOOD GAS, ARTERIAL
Acid-Base Excess: 2.5 mmol/L — ABNORMAL HIGH (ref 0.0–2.0)
Bicarbonate: 27.4 mmol/L (ref 20.0–28.0)
Drawn by: 42180
FIO2: 21
O2 Saturation: 57.5 %
Patient temperature: 37
pCO2 arterial: 49.2 mmHg — ABNORMAL HIGH (ref 32.0–48.0)
pH, Arterial: 7.365 (ref 7.350–7.450)
pO2, Arterial: 32.5 mmHg — CL (ref 83.0–108.0)

## 2020-01-16 LAB — COMPREHENSIVE METABOLIC PANEL
ALT: 49 U/L — ABNORMAL HIGH (ref 0–44)
AST: 32 U/L (ref 15–41)
Albumin: 3.7 g/dL (ref 3.5–5.0)
Alkaline Phosphatase: 55 U/L (ref 38–126)
Anion gap: 10 (ref 5–15)
BUN: 12 mg/dL (ref 8–23)
CO2: 27 mmol/L (ref 22–32)
Calcium: 9.2 mg/dL (ref 8.9–10.3)
Chloride: 101 mmol/L (ref 98–111)
Creatinine, Ser: 1 mg/dL (ref 0.61–1.24)
GFR calc Af Amer: >60 mL/min (ref >60)
GFR calc non Af Amer: >60 mL/min (ref >60)
Glucose, Bld: 170 mg/dL — ABNORMAL HIGH (ref 70–99)
Potassium: 4 mmol/L (ref 3.5–5.1)
Sodium: 138 mmol/L (ref 135–145)
Total Bilirubin: 0.7 mg/dL (ref 0.3–1.2)
Total Protein: 7.1 g/dL (ref 6.5–8.1)

## 2020-01-16 LAB — TYPE AND SCREEN
ABO/RH(D): B NEG
Antibody Screen: NEGATIVE

## 2020-01-16 LAB — SARS CORONAVIRUS 2 (TAT 6-24 HRS): SARS Coronavirus 2: NEGATIVE

## 2020-01-16 LAB — BRAIN NATRIURETIC PEPTIDE: B Natriuretic Peptide: 90.6 pg/mL (ref 0.0–100.0)

## 2020-01-16 LAB — PROTIME-INR
INR: 1 (ref 0.8–1.2)
Prothrombin Time: 12.4 seconds (ref 11.4–15.2)

## 2020-01-16 LAB — GLUCOSE, CAPILLARY: Glucose-Capillary: 154 mg/dL — ABNORMAL HIGH (ref 70–99)

## 2020-01-16 LAB — APTT: aPTT: 29 seconds (ref 24–36)

## 2020-01-16 LAB — HEMOGLOBIN A1C
Hgb A1c MFr Bld: 6.8 % — ABNORMAL HIGH (ref 4.8–5.6)
Mean Plasma Glucose: 148.46 mg/dL

## 2020-01-16 NOTE — Progress Notes (Signed)
Spoke to [place], [place] regarding abnormal labs and critical lab value PO2 32.5.

## 2020-01-16 NOTE — Progress Notes (Signed)
PCP:  Evelina Dun, FNP Cardiologist:  Dr. Minus Breeding, MD  EKG:  01/16/20 CXR:  01/16/20 ECHO:  09/16/19 Stress Test:  Denies Cardiac Cath:  12/30/19  Fasting Blood Sugar-  158-215 Checks Blood Sugar_1__ times a day  Anesthesia Review:  Yes, cardiac history.  Last dose Elilquis 01/14/20  Patient denies shortness of breath, fever, cough, and chest pain at PAT appointment.  Patient verbalized understanding of instructions provided today at the PAT appointment.  Patient asked to review instructions at home and day of surgery.

## 2020-01-16 NOTE — Progress Notes (Signed)
ABI completed. Refer to "CV Proc" under chart review to view preliminary results.  01/16/2020 9:29 AM Kelby Aline., MHA, RVT, RDCS, RDMS

## 2020-01-19 MED ORDER — POTASSIUM CHLORIDE 2 MEQ/ML IV SOLN
80.0000 meq | INTRAVENOUS | Status: DC
  Filled 2020-01-19: qty 40

## 2020-01-19 MED ORDER — MAGNESIUM SULFATE 50 % IJ SOLN
40.0000 meq | INTRAMUSCULAR | Status: DC
  Filled 2020-01-19: qty 9.85

## 2020-01-19 MED ORDER — NOREPINEPHRINE 4 MG/250ML-% IV SOLN
0.0000 ug/min | INTRAVENOUS | Status: DC
  Filled 2020-01-19: qty 250

## 2020-01-19 MED ORDER — VANCOMYCIN HCL 1500 MG/300ML IV SOLN
1500.0000 mg | INTRAVENOUS | Status: AC
  Administered 2020-01-20: 1500 mg via INTRAVENOUS
  Filled 2020-01-19: qty 300

## 2020-01-19 MED ORDER — DEXMEDETOMIDINE HCL IN NACL 400 MCG/100ML IV SOLN
0.1000 ug/kg/h | INTRAVENOUS | Status: AC
  Administered 2020-01-20: 1 ug/kg/h via INTRAVENOUS
  Administered 2020-01-20: 113.4 ug via INTRAVENOUS
  Filled 2020-01-19: qty 100

## 2020-01-19 MED ORDER — SODIUM CHLORIDE 0.9 % IV SOLN
INTRAVENOUS | Status: DC
  Filled 2020-01-19: qty 30

## 2020-01-19 MED ORDER — SODIUM CHLORIDE 0.9 % IV SOLN
1.5000 g | INTRAVENOUS | Status: AC
  Administered 2020-01-20: 1.5 g via INTRAVENOUS
  Filled 2020-01-19 (×2): qty 1.5

## 2020-01-19 NOTE — H&P (Signed)
[address]       [place] [address]             [phone]      Cardiothoracic Surgery Admission History and Physical   Referring Provider is Minus Breeding, MD  Primary Cardiologist is Minus Breeding, MD  PCP is Sharion Balloon, FNP      Chief Complaint  Patient presents with  . Aortic Stenosis       HPI:  The patient is a 74 year old gentleman with a history of hypertension, hyperlipidemia, type 2 diabetes, smoking and COPD, right subclavian artery stenosis, moderate left internal carotid artery stenosis, atrial fibrillation on Eliquis, and coronary artery disease status post coronary artery bypass graft surgery x4 by me in 2011 after a non-ST segment elevation MI. He had a left internal mammary graft to the LAD, saphenous vein graft to the OM, and sequential saphenous vein graft to the PDA and PLA. He has done well since his surgery but over the past 12 months has developed progressive exertional fatigue, shortness of breath, and chest discomfort. This is now occurring with low-level activity such as walking on level ground or in his house. He cannot do yard work anymore. He has had some episodes of dizziness but no syncope. He has bilateral lower extremity edema which has been chronic in the left leg and does not change. The edema in his right leg goes down at night and worsens during the day. He had an echocardiogram in August 2019 showing moderate aortic stenosis with a mean gradient of 27 mmHg. Echocardiogram on 09/16/2019 showed progression to severe aortic stenosis with a mean gradient of 52 mmHg and a dimensionless index of 0.22. Aortic valve area was 0.62 cm. Left ventricular ejection fraction was 55 to 60% with grade 1 diastolic dysfunction.  The patient lives with his wife. He is retired since 2011. He remained active until the past year but now is mostly sedentary due to his symptoms.      Past Medical History:  Diagnosis Date  . Aortic stenosis     . Atrial fibrillation ([place])   . CAD (coronary artery disease)    a. s/p NSTEMI 4/11 => s/p CABG (L-LAD, S-OM2, S-PDA/PL); b. ETT-Myoview 6/14: normal study, no ischemia, EF 67%  . Carotid stenosis    Carotid U/S 4/85: RICA 4-62%, LICA 70-35%; right vertebral flow retrograde suggestive of steel-consider PV consult  . COPD (chronic obstructive pulmonary disease) ([place])   . Diabetes mellitus without complication ([place])   . HLD (hyperlipidemia)   . HTN (hypertension)   . Obesity   . Renal insufficiency   . Subclavian artery stenosis, right ([place])    based upon carotid U/S done 11/2012        Past Surgical History:  Procedure Laterality Date  . CORONARY ARTERY BYPASS GRAFT     2011, LIMA to LAD coronary artery, SVG to OM2 branch of lect circumflex coronary artery, and a sequential SVG to psot descening to posterolateral branches to RCA  . endoscopic vein harvesting     right leg   . HERNIA REPAIR    . RIGHT/LEFT HEART CATH AND CORONARY/GRAFT ANGIOGRAPHY N/A 12/31/2017   Procedure: RIGHT/LEFT HEART CATH AND CORONARY/GRAFT ANGIOGRAPHY; Surgeon: Leonie Man, MD; Location: [facility]; Service: Cardiovascular; Laterality: N/A;  . RIGHT/LEFT HEART CATH AND CORONARY/GRAFT ANGIOGRAPHY N/A 12/30/2019   Procedure: RIGHT/LEFT HEART CATH AND CORONARY/GRAFT ANGIOGRAPHY; Surgeon: Belva Crome, MD; Location: [facility]  [facility]; Service: Cardiovascular; Laterality: N/A;  . TONSILLECTOMY          Family History  Problem Relation Age of Onset  . Heart disease Father   . Heart attack Father   . Mental illness Mother   . Mental illness Sister   . Diabetes Brother   . Depression Maternal Aunt    Social History        Socioeconomic History  . Marital status: Married    Spouse name: Lucita Ferrara  . Number of children: 3  . Years of education: 8  . Highest education level: 8th grade  Occupational History  . Occupation: Retired    Comment: scrap yard  Tobacco Use  . Smoking status: Former  Smoker    Quit date: 07/11/2009    Years since quitting: 10.4  . Smokeless tobacco: Never Used  . Tobacco comment: smoked about 10 cig/day; used to smoke 2 ppd for many years   Vaping Use  . Vaping Use: Never used  Substance and Sexual Activity  . Alcohol use: No    Alcohol/week: 0.0 standard drinks  . Drug use: No  . Sexual activity: Never  Other Topics Concern  . Not on file  Social History Narrative   Lives at home with his wife. He is retired but his wife continues to work daily. They have adult children that live locally and grandchildren that they spend time with regularly. He is active around his home and yard.    Social Determinants of Health      Financial Resource Strain:   . Difficulty of Paying Living Expenses:   Food Insecurity:   . Worried About Charity fundraiser in the Last Year:   . Arboriculturist in the Last Year:   Transportation Needs:   . Film/video editor (Medical):   Marland Kitchen Lack of Transportation (Non-Medical):   Physical Activity:   . Days of Exercise per Week:   . Minutes of Exercise per Session:   Stress:   . Feeling of Stress :   Social Connections:   . Frequency of Communication with Friends and Family:   . Frequency of Social Gatherings with Friends and Family:   . Attends Religious Services:   . Active Member of Clubs or Organizations:   . Attends Archivist Meetings:   Marland Kitchen Marital Status:   Intimate Partner Violence:   . Fear of Current or Ex-Partner:   . Emotionally Abused:   Marland Kitchen Physically Abused:   . Sexually Abused:          Current Outpatient Medications  Medication Sig Dispense Refill  . albuterol (PROVENTIL HFA;VENTOLIN HFA) 108 (90 Base) MCG/ACT inhaler Inhale 2 puffs into the lungs every 6 (six) hours as needed for wheezing or shortness of breath. 1 Inhaler 0  . apixaban (ELIQUIS) 5 MG TABS tablet Take 1 tablet (5 mg total) by mouth 2 (two) times daily. 60 tablet 1  . atorvastatin (LIPITOR) 40 MG tablet TAKE 1 TABLET BY  MOUTH DAILY (Patient taking differently: Take 40 mg by mouth daily. ) 90 tablet 3  . cholecalciferol (VITAMIN D3) 25 MCG (1000 UT) tablet Take 1,000 Units by mouth daily.    . colchicine 0.6 MG tablet Take 1 tablet (0.6 mg total) by mouth daily. 2 tablets now and then 1 tablet in one hour. Then one tablet daily until symptoms resolve. (Patient taking differently: Take 0.6-1.2 mg by mouth daily as needed (gout flare ups). ) 30 tablet 0  .  fluticasone furoate-vilanterol (BREO ELLIPTA) 100-25 MCG/INH AEPB Inhale 1 puff by mouth once daily (Patient taking differently: Inhale 1 puff into the lungs daily. ) 60 each 0  . glimepiride (AMARYL) 2 MG tablet Take 1 tablet (2 mg total) by mouth daily with breakfast. 90 tablet 1  . glucose blood (ONETOUCH VERIO) test strip Test BS daily Dx E11.9 100 strip 3  . Lancets (ONETOUCH DELICA PLUS [id number]) MISC USE DAILY 100 each 3  . levothyroxine (SYNTHROID) 150 MCG tablet TAKE 1 TABLET BY MOUTH DAILY (Patient taking differently: Take 150 mcg by mouth daily before breakfast. ) 90 tablet 3  . metFORMIN (GLUCOPHAGE) 1000 MG tablet Take 1 tablet (1,000 mg total) by mouth 2 (two) times daily. 60 tablet 0  . Misc Natural Products (TART CHERRY ADVANCED PO) Take 1 tablet by mouth daily.     . nitroGLYCERIN (NITROSTAT) 0.4 MG SL tablet DISSOLVE ONE TABLET UNDER THE TONGUE EVERY 5 MINUTES AS NEEDED FOR CHEST PAIN. DO NOT EXCEED A TOTAL OF 3 DOSES IN 15 MINUTES 25 tablet 0  . potassium chloride SA (KLOR-CON) 20 MEQ tablet TAKE 1 TABLET BY MOUTH DAILY 90 tablet 3  . torsemide (DEMADEX) 20 MG tablet Take 2 tablets (40 mg total) by mouth daily. 180 tablet 3  . valsartan (DIOVAN) 40 MG tablet TAKE ONE-HALF TABLET BY MOUTH DAILY (Patient taking differently: Take 20 mg by mouth daily. ) 45 tablet 1  . metoprolol tartrate (LOPRESSOR) 50 MG tablet Take as directed prior to 01/01/20 CT scans (Patient not taking: Reported on 01/07/2020) 1 tablet 0   No current facility-administered  medications for this visit.        Allergies  Allergen Reactions  . Prednisone Other (See Comments)    Pt states "sugar went to 580"  . Januvia [Sitagliptin] Nausea And Vomiting  . Jardiance [Empagliflozin] Rash  . Lisinopril Cough   Review of Systems:   General: normal appetite, + decreased energy, no weight gain, no weight loss, no fever  Cardiac: + chest pain with exertion, + chest pain at rest, +SOB with mild exertion, + resting SOB, no PND, + orthopnea, no palpitations, + arrhythmia, + atrial fibrillation, + LE edema, + dizzy spells, no syncope  Respiratory: + shortness of breath, no home oxygen, no productive cough, no dry cough, no bronchitis, + wheezing, no hemoptysis, no asthma, no pain with inspiration or cough, no sleep apnea, no CPAP at night  GI: no difficulty swallowing, no reflux, no frequent heartburn, no hiatal hernia, no abdominal pain, no constipation, no diarrhea, no hematochezia, no hematemesis, no melena  GU: no dysuria, no frequency, no urinary tract infection, no hematuria, no enlarged prostate, no kidney stones, no kidney disease  Vascular: no pain suggestive of claudication, + pain in feet, no leg cramps, no varicose veins, no DVT, no non-healing foot ulcer  Neuro: no stroke, no TIA's, no seizures, no headaches, no temporary blindness one eye, no slurred speech, no peripheral neuropathy, no chronic pain, no instability of gait, no memory/cognitive dysfunction  Musculoskeletal: no arthritis, no joint swelling, no myalgias, no difficulty walking, normal mobility  Skin: no rash, no itching, no skin infections, no pressure sores or ulcerations  Psych: no anxiety, no depression, no nervousness, no unusual recent stress  Eyes: no blurry vision, no floaters, no recent vision changes, does not wear glasses or contacts  ENT: no hearing loss, no loose or painful teeth, + dentures Hematologic: + easy bruising, no abnormal bleeding, no clotting disorder, no frequent epistaxis  Endocrine: + diabetes, does check CBG's at home    Physical Exam:   BP 138/73  Pulse 65  Temp 97.7 F (36.5 C) (Skin)  Ht 5' 7.5" (1.715 m)  Wt 242 lb (109.8 kg)  SpO2 95% Comment: RA  BMI 37.34 kg/m  General: Elderly but well-appearing  HEENT: Unremarkable, NCAT, PERLA, EOMI  Neck: no JVD, no bruits, no adenopathy  Chest: clear to auscultation, symmetrical breath sounds, no wheezes, no rhonchi  CV: RRR, grade lll/VI crescendo/decrescendo murmur heard best at LLSB, no diastolic murmur  Abdomen: soft, non-tender, no masses  Extremities: warm, well-perfused, dp and pt pulses not palpable at ankle and may be due to edema, moderate bilateral LE edema with chronic venous stasis changes of both lower legs.  Rectal/GU Deferred  Neuro: Grossly non-focal and symmetrical throughout  Skin: Clean and dry, no rashes, no breakdown    Diagnostic Tests:   ECHOCARDIOGRAM REPORT     Patient Name: ANGELES PAOLUCCI Date of Exam: 09/16/2019  Medical Rec #: [id number] Height: 66.5 in  Accession #: [id number] Weight: 242.0 lb  Date of Birth: August 19, 1945 BSA: 2.181 m  Patient Age: 76 years BP: 154/82 mmHg  Patient Gender: M HR: 81 bpm.  Exam Location: [facility]   Procedure: 2D Echo, Cardiac Doppler and Color Doppler   Indications: I35.0 (ICD-10-CM) - Nonrheumatic aortic valve stenosis   History: Patient has prior history of Echocardiogram examinations,  most  recent 01/08/2018. CAD, COPD, Arrythmias:Trigeminy and  Atrial  Fibrillation; Risk Factors:Hypertension, Diabetes and  Dyslipidemia. Aortic valve stenosis,Subclavian artery  stenosis,  right.   Sonographer: Alvino Chapel RCS  Referring Phys: [place]    1. Left ventricular ejection fraction, by estimation, is 55 to 60%. The  left ventricle has normal function. The left ventricle has no regional  wall motion abnormalities. There is mild left ventricular hypertrophy.  Left ventricular diastolic  parameters  are consistent with Grade I diastolic dysfunction (impaired relaxation).  2. Right ventricular systolic function is normal. The right ventricular  size is normal. Tricuspid regurgitation signal is inadequate for assessing  PA pressure.  3. Left atrial size was mildly dilated.  4. The mitral valve is grossly normal with mild annular calcification.  Trivial mitral valve regurgitation.  5. The aortic valve is tricuspid, moderately calcified with severely  reduced cusp excursion. Aortic valve regurgitation is not visualized.  Severe aortic valve stenosis. Aortic valve area, by VTI measures 0.62 cm.  Aortic valve mean gradient measures  52.0 mmHg. Aortic valve Vmax measures 4.61 m/s. Dimentionless index 0.22.  There has been significant progression in comparison to prior stuy in  2019.  6. The inferior vena cava is dilated in size with >50% respiratory  variability, suggesting right atrial pressure of 8 mmHg.   FINDINGS  Left Ventricle: Left ventricular ejection fraction, by estimation, is 55  to 60%. The left ventricle has normal function. The left ventricle has no  regional wall motion abnormalities. The left ventricular internal cavity  size was normal in size. There is  mild left ventricular hypertrophy. Left ventricular diastolic parameters  are consistent with Grade I diastolic dysfunction (impaired relaxation).   Right Ventricle: The right ventricular size is normal. No increase in  right ventricular wall thickness. Right ventricular systolic function is  normal. Tricuspid regurgitation signal is inadequate for assessing PA  pressure.   Left Atrium: Left atrial size was mildly dilated.   Right Atrium: Right atrial size was normal in size.  Pericardium: There is no evidence of pericardial effusion. Presence of  pericardial fat pad.   Mitral Valve: The mitral valve is grossly normal. Mild mitral annular  calcification. Trivial mitral valve regurgitation.    Tricuspid Valve: The tricuspid valve is grossly normal. Tricuspid valve  regurgitation is trivial.   Aortic Valve: The aortic valve is tricuspid. Aortic valve regurgitation is  not visualized. Severe aortic stenosis is present. Moderate aortic valve  annular calcification. There is moderate calcification of the aortic  valve. Aortic valve mean gradient  measures 52.0 mmHg. Aortic valve peak gradient measures 85.0 mmHg. Aortic  valve area, by VTI measures 0.62 cm.   Pulmonic Valve: The pulmonic valve was grossly normal. Pulmonic valve  regurgitation is trivial.   Aorta: The aortic root is normal in size and structure.   Venous: The inferior vena cava is dilated in size with greater than 50%  respiratory variability, suggesting right atrial pressure of 8 mmHg.   IAS/Shunts: No atrial level shunt detected by color flow Doppler.    LEFT VENTRICLE  PLAX 2D  LVIDd: 4.49 cm Diastology  LVIDs: 2.45 cm LV e' lateral: 6.64 cm/s  LV PW: 1.26 cm LV E/e' lateral: 19.7  LV IVS: 1.32 cm LV e' medial: 6.31 cm/s  LVOT diam: 1.90 cm LV E/e' medial: 20.8  LV SV: 65  LV SV Index: 30  LVOT Area: 2.84 cm    RIGHT VENTRICLE  RV Mid diam: 3.27 cm  RV S prime: 6.58 cm/s  TAPSE (M-mode): 1.5 cm   LEFT ATRIUM Index  LA diam: 3.80 cm 1.74 cm/m  LA Vol (A2C): 68.5 ml 31.41 ml/m  LA Vol (A4C): 70.6 ml 32.37 ml/m  LA Biplane Vol: 69.5 ml 31.87 ml/m  AORTIC VALVE  AV Area (Vmax): 0.54 cm  AV Area (Vmean): 0.53 cm  AV Area (VTI): 0.62 cm  AV Vmax: 461.00 cm/s  AV Vmean: 336.000 cm/s  AV VTI: 1.040 m  AV Peak Grad: 85.0 mmHg  AV Mean Grad: 52.0 mmHg  LVOT Vmax: 87.50 cm/s  LVOT Vmean: 62.400 cm/s  LVOT VTI: 0.228 m  LVOT/AV VTI ratio: 0.22   AORTA  Ao Root diam: 4.00 cm   MITRAL VALVE  MV Area (PHT): 2.39 cm SHUNTS  MV Decel Time: 318 msec Systemic VTI: 0.23 m  MV E velocity: 131.00 cm/s Systemic Diam: 1.90 cm  MV A velocity: 151.00 cm/s  MV E/A ratio: 0.87   Rozann Lesches MD  Electronically signed by Rozann Lesches MD  Signature Date/Time: 09/16/2019/12:28:57 PM    Physicians  Panel Physicians Referring Physician Case Authorizing Physician  Belva Crome, MD (Primary)    Procedures  RIGHT/LEFT HEART CATH AND CORONARY/GRAFT ANGIOGRAPHY  Conclusion  Severe calcific aortic stenosis. Peak to peak gradient 69 mmHg with mean gradient 59 mmHg. Aortic valve area 0.78 cm. Relatively high output state documented by both Fick (7.2 L/min) and thermal dilution (7.99 L/min)  Occluded saphenous vein graft to the circumflex.  Patent sequential saphenous vein graft to the PDA and PL.  Widely patent LIMA to LAD.  Widely patent native left main.  Patent LAD with competitive flow into the distal LAD from LIMA.  Severe diffuse disease in the mid to distal circumflex with functional total occlusion and collateralization of the second obtuse marginal from right to left collaterals.  Total occlusion mid RCA  Normal pulmonary wedge pressure and mildly elevated pulmonary artery pressures (mean pressure 23 mmHg).Marland Kitchen RECOMMENDATIONS  Resume Eliquis a.m. 12/31/2019.  Forward data to  the TAVR valve clinic, Dr. Burt Knack, and Dr. Arvid Right. Surgeon Notes    12/30/2019 9:10 AM CV Procedure signed by Belva Crome, MD  Indications  Severe aortic stenosis [I35.0 (ICD-10-CM)]  Coronary artery disease of bypass graft of native heart with stable angina pectoris ([place]) [I25.708 (ICD-10-CM)]  Procedural Details  Technical Details The right femoral was sterilely prepped and draped. 1% Xylocaine local infiltration was then given for local analgesia. Sedation was administered in the form of intravenous Versed and fentanyl.. Using real-time vascular ultrasound, a Seldinger needle was used to perform an anterior wall common femoral vein and later artery stick. A VUS image was saved for the permanent record. In sequential fashion, A 7 French sheath was inserted into the right femoral vein  using the modified Seldinger technique. Next a 5 [place] arterial sheath was inserted into the right femoral artery. Right heart catheterization was performed via the 7 French venous sheath using a 7 [place] Swan-Ganz. Pressure recordings were made in each right heart chamber. A capillary wedge pressure was documented. Thermodilution cardiac outputs were recorded. Fick cardiac output was recorded based upon the mixed venous O2 saturation obtained from the main pulmonary artery.  Next, bypass graft angiography, right coronary angiography, and aortic valve pressure gradient was performed using a B2 5 [place] multipurpose catheter. A 5 [place] JL4 was used for left coronary angiography. A 5 French IMA catheter was used for left internal mammary angiography. Left ventriculography was not performed.   During this procedure the patient is administered a total of Versed 2 mg and Fentanyl 25 mcg to achieve and maintain moderate conscious sedation. The patient's heart rate, blood pressure, and oxygen saturation are monitored continuously during the procedure. The period of conscious sedation is 40 minutes, of which I was present face-to-face 100% of this time. Estimated blood loss <50 mL.   During this procedure medications were administered to achieve and maintain moderate conscious sedation while the patient's heart rate, blood pressure, and oxygen saturation were continuously monitored and I was present face-to-face 100% of this time.  Medications  (Filter: Administrations occurring from [phone] to 0847 on 12/30/19)  (important) Continuous medications are totaled by the amount administered until 12/30/19 0847.  midazolam (VERSED) injection (mg)  Total dose: 2 mg  Date/Time  Rate/Dose/Volume Action  12/30/19 0749  1 mg Given  0758  1 mg Given  fentaNYL (SUBLIMAZE) injection (mcg)  Total dose: 25 mcg  Date/Time  Rate/Dose/Volume Action  12/30/19 0749  25 mcg Given  Heparin (Porcine) in NaCl 1000-0.9 UT/500ML-%  SOLN (mL)  Total volume: 1,000 mL  Date/Time  Rate/Dose/Volume Action  12/30/19 0754  500 mL Given  0754  500 mL Given  lidocaine (PF) (XYLOCAINE) 1 % injection (mL)  Total volume: 20 mL  Date/Time  Rate/Dose/Volume Action  12/30/19 0754  20 mL Given  Sedation Time  Sedation Time Physician-1: 48 minutes 8 seconds  Contrast  * No intraprocedure medications in log *  Radiation/Fluoro  Fluoro time: 11.8 (min)  DAP: 31926 (mGycm2)  Cumulative Air Kerma: 516 (mGy)  Coronary Findings  Diagnostic  Dominance: Right  Left Anterior Descending  Mid LAD lesion is 100% stenosed. The lesion is chronically occluded.  Lateral First Diagonal Branch  Lat 1st Diag lesion is 90% stenosed. The lesion is located at the bifurcation and discrete.  Second Diagonal Branch  Vessel is small in size.  Left Circumflex  Prox Cx lesion is 95% stenosed. The lesion is segmental and irregular.  Mid Cx lesion  is 100% stenosed. The lesion is chronically occluded.  First Obtuse Marginal Branch  Vessel is small in size. There is moderate disease in the vessel.  Second Obtuse Marginal Branch  There is moderate disease in the vessel.  Right Coronary Artery  Vessel is moderate in size.  Prox RCA to Dist RCA lesion is 100% stenosed. The lesion is chronically occluded.  Sequential Jump Graft Graft To RPDA, 1st RPL  Seq SVG- RPDA-RPL and is large. The graft exhibits no disease.  LIMA LIMA Graft To Mid LAD  LIMA and is normal in caliber. The graft exhibits no disease.  Saphenous Graft To Dist Cx  SVG and is normal in caliber. Flush occluded  Origin to Prox Graft lesion is 100% stenosed. The lesion is chronically occluded.  Intervention  No interventions have been documented.  Right Heart  Right Heart Pressures LV EDP is normal.  Right Atrium Right atrial pressure is normal.  Left Heart  Left Ventricle The left ventricular ejection fraction is 55-65% by visual estimate.  Aortic Valve There is severe aortic valve  stenosis. The aortic valve is calcified.  Coronary Diagrams  Diagnostic  Dominance: Right   Intervention  Implants     No implant documentation for this case.  Syngo Images  Show images for CARDIAC CATHETERIZATION  Images on Long Term Storage  Show images for [place], OTTAVIO NOREM to Procedure Log    Procedure Log  Hemo Data   Most Recent Value  Fick Cardiac Output 7.18 L/min  Fick Cardiac Output Index 3.23 (L/min)/BSA  Thermal Cardiac Output 7.99 L/min  Thermal Cardiac Output Index 3.6 (L/min)/BSA  Aortic Mean Gradient 58.78 mmHg  Aortic Peak Gradient 69 mmHg  Aortic Valve Area 0.86  Aortic Value Area Index 0.39 cm2/BSA  RA A Wave 6 mmHg  RA V Wave 7 mmHg  RA Mean 5 mmHg  RV Systolic Pressure 39 mmHg  RV Diastolic Pressure 1 mmHg  RV EDP 6 mmHg  PA Systolic Pressure 40 mmHg  PA Diastolic Pressure 13 mmHg  PA Mean 23 mmHg  PW A Wave 13 mmHg  PW V Wave 12 mmHg  PW Mean 11 mmHg  AO Systolic Pressure 062 mmHg  AO Diastolic Pressure 56 mmHg  AO Mean 79 mmHg  LV Systolic Pressure 376 mmHg  LV Diastolic Pressure 1 mmHg  LV EDP 10 mmHg  AOp Systolic Pressure 283 mmHg  AOp Diastolic Pressure 54 mmHg  AOp Mean Pressure 77 mmHg  LVp Systolic Pressure 151 mmHg  LVp Diastolic Pressure 1 mmHg  LVp EDP Pressure 13 mmHg  TPVR Index 6.39 HRUI  TSVR Index 21.93 HRUI  PVR SVR Ratio 0.16  TPVR/TSVR Ratio 0.29     CLINICAL DATA: 74 year old male with a history of carotid stenosis  follow-up  EXAM:  BILATERAL CAROTID DUPLEX ULTRASOUND  TECHNIQUE:  Pearline Cables scale imaging, color Doppler and duplex ultrasound were  performed of bilateral carotid and vertebral arteries in the neck.  COMPARISON: 11/06/2016  FINDINGS:  Criteria: Quantification of carotid stenosis is based on velocity  parameters that correlate the residual internal carotid diameter  with NASCET-based stenosis levels, using the diameter of the distal  internal carotid lumen as the denominator for stenosis  measurement.  The following velocity measurements were obtained:  RIGHT  ICA: Systolic 761 cm/sec, Diastolic 15 cm/sec  CCA: 607 cm/sec  SYSTOLIC ICA/CCA RATIO: 1.7  ECA: 122 cm/sec  LEFT  ICA: Systolic 371 cm/sec, Diastolic 26 cm/sec  CCA: 91 cm/sec  SYSTOLIC ICA/CCA RATIO:  1.0  ECA: 122 cm/sec  Right Brachial SBP: Not acquired  Left Brachial SBP: Not acquired  RIGHT CAROTID ARTERY: No significant calcifications of the right  common carotid artery. Intermediate waveform maintained. Moderate  heterogeneous and partially calcified plaque at the right carotid  bifurcation. No significant lumen shadowing. Low resistance waveform  of the right ICA. No significant tortuosity.  RIGHT VERTEBRAL ARTERY: Reversal of the right vertebral artery in  systole.  LEFT CAROTID ARTERY: No significant calcifications of the left  common carotid artery. Intermediate waveform maintained. Moderate  heterogeneous and partially calcified plaque at the left carotid  bifurcation. No significant lumen shadowing. Low resistance waveform  of the left ICA. No significant tortuosity.  LEFT VERTEBRAL ARTERY: Antegrade flow with low resistance waveform.  IMPRESSION:  Right:  Heterogeneous and partially calcified plaque at the right carotid  bifurcation, with discordant results regarding degree of stenosis by  established duplex criteria. Peak velocity suggests 50%-69%  stenosis, with the ICA/ CCA ratio suggesting a lesser degree of  stenosis. If establishing a more accurate degree of stenosis is  required, cerebral angiogram should be considered, or as a second  best test, CTA.  Left:  Color duplex indicates moderate heterogeneous and calcified plaque,  with no hemodynamically significant stenosis by duplex criteria in  the extracranial cerebrovascular circulation.  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  Signed,  Dulcy Fanny. Dellia Nims, RPVI  [facility]  [facility]  Electronically Signed  By: Corrie Mckusick D.O.  On: 09/16/2019 16:24  ADDENDUM REPORT: 01/01/2020 17:44  CLINICAL DATA: Severe Aortic Stenosis.  EXAM:  Cardiac TAVR CT  TECHNIQUE:  The patient was scanned on a [employer]. A 120 kV  retrospective scan was triggered in the descending thoracic aorta at  111 HU's. Gantry rotation speed was 250 msecs and collimation was .6  mm. No beta blockade or nitro were given. The 3D data set was  reconstructed in 5% intervals of the R-R cycle. Systolic and  diastolic phases were analyzed on a dedicated work station using  MPR, MIP and VRT modes. The patient received 80 cc of contrast.  FINDINGS:  Image quality: Excellent.  Noise artifact is: Limited.  Valve Morphology: Tricuspid aortic valve with severe calcifications.  All 3 leaflets are diffusely calcified with restricted movement in  systole.  Aortic Valve Calcium score: 2887  Aortic annular dimension:  Phase assessed: 15%  Annular area: 558 cm2  Annular perimeter: 84.7 Mm  Max diameter: 29.8 Mm  Min diameter: 25.2 mm  Annular and subannular calcification: None.  Optimal coplanar projection: LAO 10 CRA 1  Coronary Artery Height above Annulus:  Left Main: 12.1 mm  Right Coronary: 18.8 mm  Sinus of Valsalva Measurements:  Non-coronary: 36 mm  Right-coronary: 34 mm  Left-coronary: 34 mm  Sinus of Valsalva Height:  Non-coronary: 27.2 mm  Right-coronary: 23.2 mm  Left-coronary: 18.2 mm  Sinotubular Junction: 30 mm with mild to moderate calcified plaque.  Ascending Thoracic Aorta: 36 mm.  Coronary Arteries: Normal coronary origin. Right dominance. The  study was performed without use of NTG and is insufficient for  plaque evaluation. Please refer to recent cardiac catheterization  for coronary assessment. The patient is s/p CABG. There is a patent  LIMA to mid LAD. There is a patent SVG to the distal RCA. There is   an occluded SVG to the reported LCX. There are severe 3-vessel  calcifications noted.  Cardiac Morphology:  Right Atrium: Right atrial size is within normal limits.  Right Ventricle: The right ventricular cavity is within normal  limits.  Left Atrium: Left atrial size is normal in size with no left atrial  appendage filling defect.  Left Ventricle: The ventricular cavity size is within normal limits.  There is severe asymmetric basal septal hypertrophy up to 19 mm.  There is no mitral valve SAM to suggest LVOT obstruction. There are  no stigmata of prior infarction. There is no abnormal filling  defect. LVEF=73%. There is abnormal septal motion consistent with  postoperative septum.  Pulmonary arteries: Normal in size without proximal filling defect.  Pulmonary veins: Normal pulmonary venous drainage.  Pericardium: Normal thickness with no significant effusion or  calcium present.  Mitral Valve: The mitral valve is normal structure without  significant calcification.  Extra-cardiac findings: See attached radiology report for  non-cardiac structures.  IMPRESSION:  1. Annular measurements appropriate for 29 mm Edwards S3 TAVR.  2. No significant annular or subannular calcifications.  3. Sufficient coronary to annulus distance.  4. Optimal Fluoroscopic Angle for Delivery: LAO 10 CRA 1  5. Severe asymmetric hypertrophy of the basal septum up to 19 mm.  Lake Bells T. Audie Box, MD  Electronically Signed  By: Eleonore Chiquito  On: 01/01/2020 17:44   Addended by Geralynn Rile, MD on 01/01/2020 5:46 PM  Study Result  Addenda  ADDENDUM REPORT: 01/01/2020 17:44  CLINICAL DATA: Severe Aortic Stenosis.  EXAM:  Cardiac TAVR CT  TECHNIQUE:  The patient was scanned on a [employer]. A 120 kV  retrospective scan was triggered in the descending thoracic aorta at  111 HU's. Gantry rotation speed was 250 msecs and collimation was .6  mm. No beta blockade or nitro were given. The 3D  data set was  reconstructed in 5% intervals of the R-R cycle. Systolic and  diastolic phases were analyzed on a dedicated work station using  MPR, MIP and VRT modes. The patient received 80 cc of contrast.  FINDINGS:  Image quality: Excellent.  Noise artifact is: Limited.  Valve Morphology: Tricuspid aortic valve with severe calcifications.  All 3 leaflets are diffusely calcified with restricted movement in  systole.  Aortic Valve Calcium score: 2887  Aortic annular dimension:  Phase assessed: 15%  Annular area: 558 cm2  Annular perimeter: 84.7 Mm  Max diameter: 29.8 Mm  Min diameter: 25.2 mm  Annular and subannular calcification: None.  Optimal coplanar projection: LAO 10 CRA 1  Coronary Artery Height above Annulus:  Left Main: 12.1 mm  Right Coronary: 18.8 mm  Sinus of Valsalva Measurements:  Non-coronary: 36 mm  Right-coronary: 34 mm  Left-coronary: 34 mm  Sinus of Valsalva Height:  Non-coronary: 27.2 mm  Right-coronary: 23.2 mm  Left-coronary: 18.2 mm  Sinotubular Junction: 30 mm with mild to moderate calcified plaque.  Ascending Thoracic Aorta: 36 mm.  Coronary Arteries: Normal coronary origin. Right dominance. The  study was performed without use of NTG and is insufficient for  plaque evaluation. Please refer to recent cardiac catheterization  for coronary assessment. The patient is s/p CABG. There is a patent  LIMA to mid LAD. There is a patent SVG to the distal RCA. There is  an occluded SVG to the reported LCX. There are severe 3-vessel  calcifications noted.  Cardiac Morphology:  Right Atrium: Right atrial size is within normal limits.  Right Ventricle: The right ventricular cavity is within normal  limits.  Left Atrium: Left atrial size is normal in size  with no left atrial  appendage filling defect.  Left Ventricle: The ventricular cavity size is within normal limits.  There is severe asymmetric basal septal hypertrophy up to 19 mm.  There is no mitral valve  SAM to suggest LVOT obstruction. There are  no stigmata of prior infarction. There is no abnormal filling  defect. LVEF=73%. There is abnormal septal motion consistent with  postoperative septum.  Pulmonary arteries: Normal in size without proximal filling defect.  Pulmonary veins: Normal pulmonary venous drainage.  Pericardium: Normal thickness with no significant effusion or  calcium present.  Mitral Valve: The mitral valve is normal structure without  significant calcification.  Extra-cardiac findings: See attached radiology report for  non-cardiac structures.  IMPRESSION:  1. Annular measurements appropriate for 29 mm Edwards S3 TAVR.  2. No significant annular or subannular calcifications.  3. Sufficient coronary to annulus distance.  4. Optimal Fluoroscopic Angle for Delivery: LAO 10 CRA 1  5. Severe asymmetric hypertrophy of the basal septum up to 19 mm.  [place] Bells T. Audie Box, MD  Electronically Signed  By: Eleonore Chiquito  On: 01/01/2020 17:44   Signed by Geralynn Rile, MD on 01/01/2020 5:46 PM  Narrative & Impression  EXAM:  OVER-READ INTERPRETATION CT CHEST  The following report is an over-read performed by radiologist Dr.  Vinnie Langton of [facility], [place] on 01/01/2020. This  over-read does not include interpretation of cardiac or coronary  anatomy or pathology. The coronary calcium score/coronary CTA  interpretation by the cardiologist is attached.  COMPARISON: CTA chest 09/29/2009.  FINDINGS:  Extracardiac findings will be described separately under dictation  for contemporaneously obtained CTA chest, abdomen and pelvis.  IMPRESSION:  Please see separate dictation for contemporaneously obtained CTA  chest, abdomen and pelvis 01/01/2020 for full description of  relevant extracardiac findings.  Electronically Signed:  By: Vinnie Langton M.D.  On: 01/01/2020 11:54   CLINICAL DATA: 73 year old male with history of severe aortic  stenosis. Preprocedural  study prior to potential transcatheter  aortic valve replacement (TAVR) procedure.  EXAM:  CT ANGIOGRAPHY CHEST, ABDOMEN AND PELVIS  TECHNIQUE:  Non-contrast CT of the chest was initially obtained.  Multidetector CT imaging through the chest, abdomen and pelvis was  performed using the standard protocol during bolus administration of  intravenous contrast. Multiplanar reconstructed images and MIPs were  obtained and reviewed to evaluate the vascular anatomy.  CONTRAST: 164mL OMNIPAQUE IOHEXOL 350 MG/ML SOLN  COMPARISON: Chest CT 09/29/2009. CT the abdomen and pelvis  01/12/2008.  FINDINGS:  CTA CHEST FINDINGS  Cardiovascular: Heart size is normal. There is no significant  pericardial fluid, thickening or pericardial calcification. There is  aortic atherosclerosis, as well as atherosclerosis of the great  vessels of the mediastinum and the coronary arteries, including  calcified atherosclerotic plaque in the left main, left anterior  descending, left circumflex and right coronary arteries. Severe  thickening calcification of the aortic valve. Status post median  sternotomy for CABG including LIMA to the LAD.  Mediastinum/Lymph Nodes: No pathologically enlarged mediastinal or  hilar lymph nodes. Esophagus is unremarkable in appearance. No  axillary lymphadenopathy.  Lungs/Pleura: No suspicious appearing pulmonary nodules or masses  are noted. No acute consolidative airspace disease. No pleural  effusions. Scattered mild linear areas of scarring are noted in the  lungs bilaterally.  Musculoskeletal/Soft Tissues: Median sternotomy wires. There are no  aggressive appearing lytic or blastic lesions noted in the  visualized portions of the skeleton.  CTA ABDOMEN AND PELVIS FINDINGS  Hepatobiliary: Diffuse low  attenuation throughout the visualized  hepatic parenchyma, indicative of hepatic steatosis. No suspicious  cystic or solid hepatic lesions. No intra or extrahepatic biliary  ductal  dilatation. Gallbladder is normal in appearance.  Pancreas: No pancreatic mass. No pancreatic ductal dilatation. No  pancreatic or peripancreatic fluid collections or inflammatory  changes.  Spleen: Unremarkable.  Adrenals/Urinary Tract: Bilateral kidneys and bilateral adrenal  glands are normal in appearance. No hydroureteronephrosis. Urinary  bladder is normal in appearance.  Stomach/Bowel: Normal appearance of the stomach. No pathologic  dilatation of small bowel or colon. Numerous colonic diverticulae  are noted, particularly in the descending colon and sigmoid colon,  without surrounding inflammatory changes to suggest an acute  diverticulitis at this time. The appendix is not confidently  identified and may be surgically absent. Regardless, there are no  inflammatory changes noted adjacent to the cecum to suggest the  presence of an acute appendicitis at this time.  Vascular/Lymphatic: Aortic atherosclerosis with fusiform ectasia of  the infrarenal abdominal aorta which measures up to 2.8 cm in  diameter. Vascular findings and measurements pertinent to potential  TAVR procedure, as detailed above. No lymphadenopathy noted in the  abdomen or pelvis.  Reproductive: Prostate gland and seminal vesicles are unremarkable  in appearance.  Other: No significant volume of ascites. No pneumoperitoneum.  Musculoskeletal: There are no aggressive appearing lytic or blastic  lesions noted in the visualized portions of the skeleton.  VASCULAR MEASUREMENTS PERTINENT TO TAVR:  AORTA:  Minimal Aortic Diameter-18 x 14 mm  Severity of Aortic Calcification-moderate  RIGHT PELVIS:  Right Common Iliac Artery -  Minimal Diameter-9.0 x 8.4 mm  Tortuosity-mild  Calcification-moderate  Right External Iliac Artery -  Minimal Diameter-8.6 x 8.1 mm  Tortuosity-severe  Calcification-none  Right Common Femoral Artery -  Minimal Diameter-8.2 x 5.9 mm  Tortuosity-mild  Calcification-moderate  LEFT  PELVIS:  Left Common Iliac Artery -  Minimal Diameter-10.1 x 9.4 mm  Tortuosity-moderate  Calcification-mild  Left External Iliac Artery -  Minimal Diameter-7.8 x 8.0 mm  Tortuosity-severe  Calcification-none  Left Common Femoral Artery -  Minimal Diameter-7.6 x 7.2 mm  Tortuosity-mild  Calcification-moderate  Review of the MIP images confirms the above findings.  IMPRESSION:  1. Vascular findings and measurements pertinent to potential TAVR  procedure, as detailed above.  2. Severe thickening calcification of the aortic valve, compatible  with the reported clinical history of severe aortic stenosis.  3. Aortic atherosclerosis, in addition to left main and 3 vessel  coronary artery disease. Status post median sternotomy for CABG  including LIMA to the LAD.  4. Hepatic steatosis.  5. Colonic diverticulosis without evidence of acute diverticulitis  at this time.  6. Additional incidental findings, as above.  Electronically Signed  By: Vinnie Langton M.D.  On: 01/01/2020 14:34    STS Risk Calculator:   Isolated AVR (redo sternotomy):  Risk of Mortality:  1.364%  Renal Failure:  1.382%  Permanent Stroke:  1.831%  Prolonged Ventilation:  7.375%  DSW Infection:  0.298%  Reoperation:  3.478%  Morbidity or Mortality:  11.009%  Short Length of Stay:  36.542%  Long Length of Stay:  4.700%    Impression:   This 74 year old gentleman has stage D, severe, symptomatic aortic stenosis with New York Heart Association class III symptoms of shortness of breath, fatigue, and chest discomfort with minimal exertion consistent with chronic diastolic congestive heart failure. I have personally reviewed his 2D echocardiogram, cardiac catheterization, and CTA studies. Echocardiogram shows a severely calcified trileaflet  aortic valve with a mean gradient of 52 mmHg and a valve area of 0.62 cm consistent with severe to critical aortic stenosis. Left ventricular systolic function is  normal. Cardiac catheterization shows severe three-vessel coronary disease with a patent left internal mammary graft to the LAD and a patent sequential saphenous vein graft to the PDA and PL. The saphenous vein graft to the left circumflex is occluded with filling of the second marginal branch by right to left collaterals. The mean gradient across the aortic valve was measured at 59 mmHg consistent with critical aortic stenosis. I agree that aortic valve replacement is indicated in this patient for relief of his progressive symptoms and prevention of left ventricular deterioration. Given his age and prior coronary bypass surgery I think transcatheter aortic valve replacement would be the best treatment for him. There is no indication for any coronary revascularization. His gated cardiac CTA shows anatomy suitable for transcatheter aortic valve replacement using a SAPIEN 3 valve. His abdominal and pelvic CTA shows adequate pelvic vascular anatomy to allow transfemoral insertion.   The patient and his wife were counseled at length regarding treatment alternatives for management of severe symptomatic aortic stenosis. The risks and benefits of surgical intervention has been discussed in detail. Long-term prognosis with medical therapy was discussed. Alternative approaches such as conventional surgical aortic valve replacement, transcatheter aortic valve replacement, and palliative medical therapy were compared and contrasted at length. This discussion was placed in the context of the patient's own specific clinical presentation and past medical history. All of their questions have been addressed.  Following the decision to proceed with transcatheter aortic valve replacement, a discussion was held regarding what types of management strategies would be attempted intraoperatively in the event of life-threatening complications, including whether or not the patient would be considered a candidate for the use of  cardiopulmonary bypass and/or conversion to open sternotomy for attempted surgical intervention. The patient is aware of the fact that transient use of cardiopulmonary bypass may be necessary. He has had prior bypass surgery and I do not think he is a candidate for emergent redo sternotomy to manage any intraoperative complications. The patient has been advised of a variety of complications that might develop including but not limited to risks of death, stroke, paravalvular leak, aortic dissection or other major vascular complications, aortic annulus rupture, device embolization, cardiac rupture or perforation, mitral regurgitation, acute myocardial infarction, arrhythmia, heart block or bradycardia requiring permanent pacemaker placement, congestive heart failure, respiratory failure, renal failure, pneumonia, infection, other late complications related to structural valve deterioration or migration, or other complications that might ultimately cause a temporary or permanent loss of functional independence or other long term morbidity. The patient provides full informed consent for the procedure as described and all questions were answered.   Plan:   Transfemoral transcatheter aortic valve replacement.    [name], MD

## 2020-01-20 ENCOUNTER — Inpatient Hospital Stay (HOSPITAL_COMMUNITY): Payer: Medicare Other | Admitting: Physician Assistant

## 2020-01-20 ENCOUNTER — Other Ambulatory Visit: Payer: Self-pay

## 2020-01-20 ENCOUNTER — Inpatient Hospital Stay (HOSPITAL_COMMUNITY)
Admission: RE | Admit: 2020-01-20 | Discharge: 2020-01-21 | DRG: 267 | Disposition: A | Discharge status: Home / Self Care | Payer: Medicare Other | Attending: Surgery | Admitting: Surgery

## 2020-01-20 ENCOUNTER — Inpatient Hospital Stay (HOSPITAL_COMMUNITY): Payer: Medicare Other

## 2020-01-20 ENCOUNTER — Encounter (HOSPITAL_COMMUNITY): Payer: Self-pay | Admitting: Cardiovascular Disease

## 2020-01-20 ENCOUNTER — Other Ambulatory Visit: Payer: Self-pay | Admitting: Physician Assistant

## 2020-01-20 ENCOUNTER — Encounter (HOSPITAL_COMMUNITY)
Admission: RE | Disposition: A | Discharge status: Home / Self Care | Payer: Self-pay | Source: Home / Self Care | Attending: Surgery

## 2020-01-20 ENCOUNTER — Inpatient Hospital Stay (HOSPITAL_COMMUNITY): Payer: Medicare Other | Admitting: Certified Registered"

## 2020-01-20 DIAGNOSIS — Z7951 Long term (current) use of inhaled steroids: Secondary | ICD-10-CM

## 2020-01-20 DIAGNOSIS — I2582 Chronic total occlusion of coronary artery: Secondary | ICD-10-CM | POA: Diagnosis present

## 2020-01-20 DIAGNOSIS — I440 Atrioventricular block, first degree: Secondary | ICD-10-CM | POA: Diagnosis present

## 2020-01-20 DIAGNOSIS — Z888 Allergy status to other drugs, medicaments and biological substances status: Secondary | ICD-10-CM

## 2020-01-20 DIAGNOSIS — I252 Old myocardial infarction: Secondary | ICD-10-CM | POA: Diagnosis not present

## 2020-01-20 DIAGNOSIS — Z8249 Family history of ischemic heart disease and other diseases of the circulatory system: Secondary | ICD-10-CM

## 2020-01-20 DIAGNOSIS — I350 Nonrheumatic aortic (valve) stenosis: Secondary | ICD-10-CM

## 2020-01-20 DIAGNOSIS — I708 Atherosclerosis of other arteries: Secondary | ICD-10-CM | POA: Diagnosis present

## 2020-01-20 DIAGNOSIS — J449 Chronic obstructive pulmonary disease, unspecified: Secondary | ICD-10-CM | POA: Diagnosis present

## 2020-01-20 DIAGNOSIS — I10 Essential (primary) hypertension: Secondary | ICD-10-CM | POA: Diagnosis present

## 2020-01-20 DIAGNOSIS — Z952 Presence of prosthetic heart valve: Secondary | ICD-10-CM

## 2020-01-20 DIAGNOSIS — E785 Hyperlipidemia, unspecified: Secondary | ICD-10-CM | POA: Diagnosis present

## 2020-01-20 DIAGNOSIS — I2510 Atherosclerotic heart disease of native coronary artery without angina pectoris: Secondary | ICD-10-CM | POA: Diagnosis present

## 2020-01-20 DIAGNOSIS — Z833 Family history of diabetes mellitus: Secondary | ICD-10-CM | POA: Diagnosis not present

## 2020-01-20 DIAGNOSIS — Z006 Encounter for examination for normal comparison and control in clinical research program: Secondary | ICD-10-CM

## 2020-01-20 DIAGNOSIS — Z7901 Long term (current) use of anticoagulants: Secondary | ICD-10-CM

## 2020-01-20 DIAGNOSIS — J9811 Atelectasis: Secondary | ICD-10-CM | POA: Diagnosis not present

## 2020-01-20 DIAGNOSIS — Z87891 Personal history of nicotine dependence: Secondary | ICD-10-CM

## 2020-01-20 DIAGNOSIS — E039 Hypothyroidism, unspecified: Secondary | ICD-10-CM | POA: Diagnosis present

## 2020-01-20 DIAGNOSIS — I25708 Atherosclerosis of coronary artery bypass graft(s), unspecified, with other forms of angina pectoris: Secondary | ICD-10-CM | POA: Diagnosis present

## 2020-01-20 DIAGNOSIS — Z951 Presence of aortocoronary bypass graft: Secondary | ICD-10-CM | POA: Diagnosis not present

## 2020-01-20 DIAGNOSIS — Z7984 Long term (current) use of oral hypoglycemic drugs: Secondary | ICD-10-CM | POA: Diagnosis not present

## 2020-01-20 DIAGNOSIS — Z6837 Body mass index (BMI) 37.0-37.9, adult: Secondary | ICD-10-CM | POA: Diagnosis not present

## 2020-01-20 DIAGNOSIS — I6523 Occlusion and stenosis of bilateral carotid arteries: Secondary | ICD-10-CM | POA: Diagnosis present

## 2020-01-20 DIAGNOSIS — Z954 Presence of other heart-valve replacement: Secondary | ICD-10-CM | POA: Diagnosis not present

## 2020-01-20 DIAGNOSIS — I480 Paroxysmal atrial fibrillation: Secondary | ICD-10-CM | POA: Diagnosis present

## 2020-01-20 DIAGNOSIS — I771 Stricture of artery: Secondary | ICD-10-CM | POA: Diagnosis present

## 2020-01-20 DIAGNOSIS — E119 Type 2 diabetes mellitus without complications: Secondary | ICD-10-CM | POA: Diagnosis not present

## 2020-01-20 DIAGNOSIS — Z79890 Hormone replacement therapy (postmenopausal): Secondary | ICD-10-CM

## 2020-01-20 DIAGNOSIS — I517 Cardiomegaly: Secondary | ICD-10-CM | POA: Diagnosis not present

## 2020-01-20 DIAGNOSIS — Z79899 Other long term (current) drug therapy: Secondary | ICD-10-CM

## 2020-01-20 DIAGNOSIS — R531 Weakness: Secondary | ICD-10-CM | POA: Diagnosis not present

## 2020-01-20 DIAGNOSIS — J441 Chronic obstructive pulmonary disease with (acute) exacerbation: Secondary | ICD-10-CM | POA: Diagnosis present

## 2020-01-20 HISTORY — DX: Presence of prosthetic heart valve: Z95.2

## 2020-01-20 HISTORY — PX: TRANSCATHETER AORTIC VALVE REPLACEMENT, TRANSFEMORAL: SHX6400

## 2020-01-20 HISTORY — PX: TEE WITHOUT CARDIOVERSION: SHX5443

## 2020-01-20 HISTORY — DX: Nonrheumatic aortic (valve) stenosis: I35.0

## 2020-01-20 LAB — POCT I-STAT 7, (LYTES, BLD GAS, ICA,H+H)
Acid-Base Excess: 2 mmol/L (ref 0.0–2.0)
Acid-Base Excess: 1 mmol/L (ref 0.0–2.0)
Bicarbonate: 28.8 mmol/L — ABNORMAL HIGH (ref 20.0–28.0)
Bicarbonate: 28.9 mmol/L — ABNORMAL HIGH (ref 20.0–28.0)
Calcium, Ion: 1.18 mmol/L (ref 1.15–1.40)
Calcium, Ion: 1.18 mmol/L (ref 1.15–1.40)
HCT: 38 % — ABNORMAL LOW (ref 39.0–52.0)
HCT: 35 % — ABNORMAL LOW (ref 39.0–52.0)
Hemoglobin: 12.9 g/dL — ABNORMAL LOW (ref 13.0–17.0)
Hemoglobin: 11.9 g/dL — ABNORMAL LOW (ref 13.0–17.0)
O2 Saturation: 100 %
O2 Saturation: 95 %
Potassium: 4.4 mmol/L (ref 3.5–5.1)
Potassium: 4.7 mmol/L (ref 3.5–5.1)
Sodium: 141 mmol/L (ref 135–145)
Sodium: 139 mmol/L (ref 135–145)
TCO2: 30 mmol/L (ref 22–32)
TCO2: 31 mmol/L (ref 22–32)
pCO2 arterial: 52.5 mmHg — ABNORMAL HIGH (ref 32.0–48.0)
pCO2 arterial: 58.2 mmHg — ABNORMAL HIGH (ref 32.0–48.0)
pH, Arterial: 7.347 — ABNORMAL LOW (ref 7.350–7.450)
pH, Arterial: 7.304 — ABNORMAL LOW (ref 7.350–7.450)
pO2, Arterial: 218 mmHg — ABNORMAL HIGH (ref 83.0–108.0)
pO2, Arterial: 84 mmHg (ref 83.0–108.0)

## 2020-01-20 LAB — POCT I-STAT, CHEM 8
BUN: 15 mg/dL (ref 8–23)
BUN: 15 mg/dL (ref 8–23)
BUN: 15 mg/dL (ref 8–23)
BUN: 14 mg/dL (ref 8–23)
Calcium, Ion: 1.21 mmol/L (ref 1.15–1.40)
Calcium, Ion: 1.15 mmol/L (ref 1.15–1.40)
Calcium, Ion: 1.21 mmol/L (ref 1.15–1.40)
Calcium, Ion: 1.2 mmol/L (ref 1.15–1.40)
Chloride: 102 mmol/L (ref 98–111)
Chloride: 100 mmol/L (ref 98–111)
Chloride: 99 mmol/L (ref 98–111)
Chloride: 101 mmol/L (ref 98–111)
Creatinine, Ser: 0.7 mg/dL (ref 0.61–1.24)
Creatinine, Ser: 0.7 mg/dL (ref 0.61–1.24)
Creatinine, Ser: 0.7 mg/dL (ref 0.61–1.24)
Creatinine, Ser: 0.6 mg/dL — ABNORMAL LOW (ref 0.61–1.24)
Glucose, Bld: 211 mg/dL — ABNORMAL HIGH (ref 70–99)
Glucose, Bld: 234 mg/dL — ABNORMAL HIGH (ref 70–99)
Glucose, Bld: 245 mg/dL — ABNORMAL HIGH (ref 70–99)
Glucose, Bld: 224 mg/dL — ABNORMAL HIGH (ref 70–99)
HCT: 36 % — ABNORMAL LOW (ref 39.0–52.0)
HCT: 34 % — ABNORMAL LOW (ref 39.0–52.0)
HCT: 35 % — ABNORMAL LOW (ref 39.0–52.0)
HCT: 35 % — ABNORMAL LOW (ref 39.0–52.0)
Hemoglobin: 12.2 g/dL — ABNORMAL LOW (ref 13.0–17.0)
Hemoglobin: 11.6 g/dL — ABNORMAL LOW (ref 13.0–17.0)
Hemoglobin: 11.9 g/dL — ABNORMAL LOW (ref 13.0–17.0)
Hemoglobin: 11.9 g/dL — ABNORMAL LOW (ref 13.0–17.0)
Potassium: 4.3 mmol/L (ref 3.5–5.1)
Potassium: 4.6 mmol/L (ref 3.5–5.1)
Potassium: 4.8 mmol/L (ref 3.5–5.1)
Potassium: 4.7 mmol/L (ref 3.5–5.1)
Sodium: 140 mmol/L (ref 135–145)
Sodium: 139 mmol/L (ref 135–145)
Sodium: 139 mmol/L (ref 135–145)
Sodium: 140 mmol/L (ref 135–145)
TCO2: 26 mmol/L (ref 22–32)
TCO2: 26 mmol/L (ref 22–32)
TCO2: 26 mmol/L (ref 22–32)
TCO2: 24 mmol/L (ref 22–32)

## 2020-01-20 LAB — ECHOCARDIOGRAM LIMITED
AR max vel: 2.08 cm2
AV Area VTI: 1.92 cm2
AV Area mean vel: 2.4 cm2
AV Mean grad: 2 mmHg
AV Peak grad: 4.2 mmHg
Ao pk vel: 1.02 m/s

## 2020-01-20 LAB — GLUCOSE, CAPILLARY
Glucose-Capillary: 158 mg/dL — ABNORMAL HIGH (ref 70–99)
Glucose-Capillary: 159 mg/dL — ABNORMAL HIGH (ref 70–99)
Glucose-Capillary: 270 mg/dL — ABNORMAL HIGH (ref 70–99)

## 2020-01-20 SURGERY — IMPLANTATION, AORTIC VALVE, TRANSCATHETER, FEMORAL APPROACH
Anesthesia: Monitor Anesthesia Care | Site: Chest

## 2020-01-20 MED ORDER — VANCOMYCIN HCL IN DEXTROSE 1-5 GM/200ML-% IV SOLN
1000.0000 mg | Freq: Once | INTRAVENOUS | Status: AC
  Administered 2020-01-20: 1000 mg via INTRAVENOUS
  Filled 2020-01-20: qty 200

## 2020-01-20 MED ORDER — ACETAMINOPHEN 325 MG PO TABS: 650.0000 mg | ORAL_TABLET | Freq: Four times a day (QID) | ORAL | Status: DC | PRN

## 2020-01-20 MED ORDER — ACETAMINOPHEN 650 MG RE SUPP: 650.0000 mg | Freq: Four times a day (QID) | RECTAL | Status: DC | PRN

## 2020-01-20 MED ORDER — CHLORHEXIDINE GLUCONATE 4 % EX LIQD: 30.0000 mL | CUTANEOUS | Status: DC

## 2020-01-20 MED ORDER — SODIUM CHLORIDE 0.9 % IV SOLN
1.5000 g | Freq: Two times a day (BID) | INTRAVENOUS | Status: DC
  Administered 2020-01-20 – 2020-01-21 (×2): 1.5 g via INTRAVENOUS
  Filled 2020-01-20 (×4): qty 1.5

## 2020-01-20 MED ORDER — PROPOFOL 10 MG/ML IV BOLUS
INTRAVENOUS | Status: AC
  Filled 2020-01-20: qty 20

## 2020-01-20 MED ORDER — LIDOCAINE HCL 1 % IJ SOLN
INTRAMUSCULAR | Status: DC | PRN
  Administered 2020-01-20: 20 mL

## 2020-01-20 MED ORDER — FLUTICASONE FUROATE-VILANTEROL 100-25 MCG/INH IN AEPB
1.0000 | INHALATION_SPRAY | Freq: Every day | RESPIRATORY_TRACT | Status: DC
  Administered 2020-01-21: 09:00:00 1 via RESPIRATORY_TRACT
  Filled 2020-01-20: qty 28

## 2020-01-20 MED ORDER — SODIUM CHLORIDE 0.9% FLUSH: 3.0000 mL | INTRAVENOUS | Status: DC | PRN

## 2020-01-20 MED ORDER — PHENYLEPHRINE HCL-NACL 20-0.9 MG/250ML-% IV SOLN: 0.0000 ug/min | INTRAVENOUS | Status: DC

## 2020-01-20 MED ORDER — ONDANSETRON HCL 4 MG/2ML IJ SOLN: 4.0000 mg | Freq: Four times a day (QID) | INTRAMUSCULAR | Status: DC | PRN

## 2020-01-20 MED ORDER — ATORVASTATIN CALCIUM 40 MG PO TABS
40.0000 mg | ORAL_TABLET | Freq: Every day | ORAL | Status: DC
  Administered 2020-01-20 – 2020-01-21 (×2): 40 mg via ORAL
  Filled 2020-01-20 (×2): qty 1

## 2020-01-20 MED ORDER — PROTAMINE SULFATE 10 MG/ML IV SOLN
INTRAVENOUS | Status: DC | PRN
  Administered 2020-01-20: 120 mg via INTRAVENOUS

## 2020-01-20 MED ORDER — CHLORHEXIDINE GLUCONATE 0.12 % MT SOLN
15.0000 mL | Freq: Once | OROMUCOSAL | Status: AC
  Administered 2020-01-20: 15 mL via OROMUCOSAL

## 2020-01-20 MED ORDER — 0.9 % SODIUM CHLORIDE (POUR BTL) OPTIME
TOPICAL | Status: DC | PRN
  Administered 2020-01-20: 1000 mL

## 2020-01-20 MED ORDER — CHLORHEXIDINE GLUCONATE 0.12 % MT SOLN
15.0000 mL | Freq: Once | OROMUCOSAL | Status: DC
  Filled 2020-01-20 (×2): qty 15

## 2020-01-20 MED ORDER — LACTATED RINGERS IV SOLN: INTRAVENOUS | Status: DC | PRN

## 2020-01-20 MED ORDER — IRBESARTAN 150 MG PO TABS
75.0000 mg | ORAL_TABLET | Freq: Every day | ORAL | Status: DC
  Administered 2020-01-20 – 2020-01-21 (×2): 75 mg via ORAL
  Filled 2020-01-20 (×2): qty 1

## 2020-01-20 MED ORDER — CLEVIDIPINE BUTYRATE 0.5 MG/ML IV EMUL
INTRAVENOUS | Status: DC | PRN
Start: 2020-01-20 — End: 2020-01-20
  Administered 2020-01-20: 2 mg/h via INTRAVENOUS

## 2020-01-20 MED ORDER — CHLORHEXIDINE GLUCONATE 4 % EX LIQD: 60.0000 mL | Freq: Once | CUTANEOUS | Status: DC

## 2020-01-20 MED ORDER — OXYCODONE HCL 5 MG PO TABS: 5.0000 mg | ORAL_TABLET | ORAL | Status: DC | PRN

## 2020-01-20 MED ORDER — SODIUM CHLORIDE 0.9 % IV SOLN
INTRAVENOUS | Status: AC
  Filled 2020-01-20 (×3): qty 1.2

## 2020-01-20 MED ORDER — LIDOCAINE HCL 1 % IJ SOLN
INTRAMUSCULAR | Status: AC
  Filled 2020-01-20: qty 20

## 2020-01-20 MED ORDER — INSULIN ASPART 100 UNIT/ML ~~LOC~~ SOLN
0.0000 [IU] | Freq: Three times a day (TID) | SUBCUTANEOUS | Status: DC
  Administered 2020-01-20: 12 [IU] via SUBCUTANEOUS
  Administered 2020-01-20: 2 [IU] via SUBCUTANEOUS
  Administered 2020-01-21: 4 [IU] via SUBCUTANEOUS
  Administered 2020-01-21: 8 [IU] via SUBCUTANEOUS

## 2020-01-20 MED ORDER — SODIUM CHLORIDE 0.9 % IV SOLN: 250.0000 mL | INTRAVENOUS | Status: DC | PRN

## 2020-01-20 MED ORDER — PHENYLEPHRINE HCL-NACL 10-0.9 MG/250ML-% IV SOLN: 0.0000 ug/min | INTRAVENOUS | Status: DC

## 2020-01-20 MED ORDER — ALBUTEROL SULFATE (2.5 MG/3ML) 0.083% IN NEBU: 3.0000 mL | INHALATION_SOLUTION | Freq: Four times a day (QID) | RESPIRATORY_TRACT | Status: DC | PRN

## 2020-01-20 MED ORDER — SODIUM CHLORIDE 0.9 % IV SOLN: INTRAVENOUS | Status: DC

## 2020-01-20 MED ORDER — TRAMADOL HCL 50 MG PO TABS: 50.0000 mg | ORAL_TABLET | ORAL | Status: DC | PRN

## 2020-01-20 MED ORDER — SODIUM CHLORIDE 0.9% FLUSH
3.0000 mL | Freq: Two times a day (BID) | INTRAVENOUS | Status: DC
  Administered 2020-01-21: 3 mL via INTRAVENOUS

## 2020-01-20 MED ORDER — NITROGLYCERIN IN D5W 200-5 MCG/ML-% IV SOLN: 0.0000 ug/min | INTRAVENOUS | Status: DC

## 2020-01-20 MED ORDER — LEVOTHYROXINE SODIUM 75 MCG PO TABS
150.0000 ug | ORAL_TABLET | Freq: Every day | ORAL | Status: DC
  Administered 2020-01-21: 150 ug via ORAL
  Filled 2020-01-20: qty 2

## 2020-01-20 MED ORDER — SODIUM CHLORIDE 0.9 % IV SOLN: 250.0000 mL | INTRAVENOUS | Status: DC

## 2020-01-20 MED ORDER — SODIUM CHLORIDE 0.9 % IV SOLN: INTRAVENOUS | Status: AC

## 2020-01-20 MED ORDER — ASPIRIN 81 MG PO CHEW
81.0000 mg | CHEWABLE_TABLET | Freq: Every day | ORAL | Status: DC
  Administered 2020-01-21: 81 mg via ORAL
  Filled 2020-01-20: qty 1

## 2020-01-20 MED ORDER — SODIUM CHLORIDE 0.9 % IV SOLN
INTRAVENOUS | Status: DC | PRN
  Administered 2020-01-20 (×2): 500 mL

## 2020-01-20 MED ORDER — MORPHINE SULFATE (PF) 2 MG/ML IV SOLN: 1.0000 mg | INTRAVENOUS | Status: DC | PRN

## 2020-01-20 MED ORDER — HEPARIN SODIUM (PORCINE) 1000 UNIT/ML IJ SOLN
INTRAMUSCULAR | Status: DC | PRN
Start: 2020-01-20 — End: 2020-01-20
  Administered 2020-01-20: 12000 [IU] via INTRAVENOUS

## 2020-01-20 SURGICAL SUPPLY — 89 items
ADH SKN CLS APL DERMABOND .7 (GAUZE/BANDAGES/DRESSINGS) ×2
APL PRP STRL LF DISP 70% ISPRP (MISCELLANEOUS) ×2
BAG DECANTER FOR FLEXI CONT (MISCELLANEOUS) ×2 IMPLANT
BAG SNAP BAND KOVER 36X36 (MISCELLANEOUS) ×4 IMPLANT
BLADE CLIPPER SURG (BLADE) ×2 IMPLANT
BLADE STERNUM SYSTEM 6 (BLADE) IMPLANT
BLADE SURG 10 STRL SS (BLADE) IMPLANT
CABLE ADAPT CONN TEMP 6FT (ADAPTER) ×4 IMPLANT
CANISTER SUCT 3000ML PPV (MISCELLANEOUS) IMPLANT
CATH DIAG EXPO 6F AL1 (CATHETERS) IMPLANT
CATH DIAG EXPO 6F VENT PIG 145 (CATHETERS) ×8 IMPLANT
CATH EXTERNAL FEMALE PUREWICK (CATHETERS) IMPLANT
CATH INFINITI 6F AL2 (CATHETERS) ×2 IMPLANT
CATH S G BIP PACING (CATHETERS) ×4 IMPLANT
CHLORAPREP W/TINT 26 (MISCELLANEOUS) ×4 IMPLANT
CLIP VESOCCLUDE MED 24/CT (CLIP) IMPLANT
CLIP VESOCCLUDE SM WIDE 24/CT (CLIP) IMPLANT
CLOSURE MYNX CONTROL 6F/7F (Vascular Products) ×2 IMPLANT
CNTNR URN SCR LID CUP LEK RST (MISCELLANEOUS) ×4 IMPLANT
CONT SPEC 4OZ STRL OR WHT (MISCELLANEOUS) ×8
COVER BACK TABLE 60X90IN (DRAPES) ×2 IMPLANT
COVER BACK TABLE 80X110 HD (DRAPES) IMPLANT
COVER WAND RF STERILE (DRAPES) ×2 IMPLANT
DECANTER SPIKE VIAL GLASS SM (MISCELLANEOUS) ×2 IMPLANT
DERMABOND ADVANCED (GAUZE/BANDAGES/DRESSINGS) ×2
DERMABOND ADVANCED .7 DNX12 (GAUZE/BANDAGES/DRESSINGS) ×2 IMPLANT
DEVICE CLOSURE PERCLS PRGLD 6F (VASCULAR PRODUCTS) ×4 IMPLANT
DRAPE INCISE IOBAN 66X45 STRL (DRAPES) IMPLANT
DRSG TEGADERM 4X4.75 (GAUZE/BANDAGES/DRESSINGS) ×8 IMPLANT
ELECT CAUTERY BLADE 6.4 (BLADE) IMPLANT
ELECT REM PT RETURN 9FT ADLT (ELECTROSURGICAL) ×4
ELECTRODE REM PT RTRN 9FT ADLT (ELECTROSURGICAL) ×4 IMPLANT
FELT TEFLON 6X6 (MISCELLANEOUS) ×2 IMPLANT
GAUZE SPONGE 4X4 12PLY STRL (GAUZE/BANDAGES/DRESSINGS) ×4 IMPLANT
GAUZE SPONGE 4X4 12PLY STRL LF (GAUZE/BANDAGES/DRESSINGS) ×2 IMPLANT
GLOVE BIO SURGEON STRL SZ7.5 (GLOVE) ×2 IMPLANT
GLOVE BIO SURGEON STRL SZ8 (GLOVE) ×4 IMPLANT
GLOVE EUDERMIC 7 POWDERFREE (GLOVE) ×2 IMPLANT
GLOVE ORTHO TXT STRL SZ7.5 (GLOVE) IMPLANT
GOWN STRL REUS W/ TWL LRG LVL3 (GOWN DISPOSABLE) IMPLANT
GOWN STRL REUS W/ TWL XL LVL3 (GOWN DISPOSABLE) ×2 IMPLANT
GOWN STRL REUS W/TWL LRG LVL3 (GOWN DISPOSABLE)
GOWN STRL REUS W/TWL XL LVL3 (GOWN DISPOSABLE) ×16
GUIDEWIRE SAF TJ AMPL .035X180 (WIRE) ×4 IMPLANT
GUIDEWIRE SAFE TJ AMPLATZ EXST (WIRE) ×4 IMPLANT
INSERT FOGARTY SM (MISCELLANEOUS) IMPLANT
KIT BASIN OR (CUSTOM PROCEDURE TRAY) ×4 IMPLANT
KIT HEART LEFT (KITS) ×4 IMPLANT
KIT SUCTION CATH 14FR (SUCTIONS) IMPLANT
KIT TURNOVER KIT B (KITS) ×4 IMPLANT
LOOP VESSEL MAXI BLUE (MISCELLANEOUS) IMPLANT
LOOP VESSEL MINI RED (MISCELLANEOUS) IMPLANT
NS IRRIG 1000ML POUR BTL (IV SOLUTION) ×4 IMPLANT
PACK ENDO MINOR (CUSTOM PROCEDURE TRAY) ×4 IMPLANT
PAD ARMBOARD 7.5X6 YLW CONV (MISCELLANEOUS) ×4 IMPLANT
PAD ELECT DEFIB RADIOL ZOLL (MISCELLANEOUS) ×4 IMPLANT
PENCIL BUTTON HOLSTER BLD 10FT (ELECTRODE) IMPLANT
PERCLOSE PROGLIDE 6F (VASCULAR PRODUCTS) ×8
POSITIONER HEAD DONUT 9IN (MISCELLANEOUS) ×4 IMPLANT
SET MICROPUNCTURE 5F STIFF (MISCELLANEOUS) ×4 IMPLANT
SHEATH BRITE TIP 7FR 35CM (SHEATH) ×4 IMPLANT
SHEATH PINNACLE 6F 10CM (SHEATH) ×4 IMPLANT
SHEATH PINNACLE 8F 10CM (SHEATH) ×4 IMPLANT
SLEEVE REPOSITIONING LENGTH 30 (MISCELLANEOUS) ×4 IMPLANT
SPONGE LAP 18X18 RF (DISPOSABLE) ×2 IMPLANT
STOPCOCK MORSE 400PSI 3WAY (MISCELLANEOUS) ×6 IMPLANT
SUT ETHIBOND X763 2 0 SH 1 (SUTURE) ×2 IMPLANT
SUT GORETEX CV 4 TH 22 36 (SUTURE) IMPLANT
SUT GORETEX CV4 TH-18 (SUTURE) IMPLANT
SUT MNCRL AB 3-0 PS2 18 (SUTURE) IMPLANT
SUT PROLENE 5 0 C 1 36 (SUTURE) IMPLANT
SUT PROLENE 6 0 C 1 30 (SUTURE) IMPLANT
SUT SILK  1 MH (SUTURE) ×4
SUT SILK 1 MH (SUTURE) ×2 IMPLANT
SUT VIC AB 2-0 CT1 27 (SUTURE)
SUT VIC AB 2-0 CT1 TAPERPNT 27 (SUTURE) IMPLANT
SUT VIC AB 2-0 CTX 36 (SUTURE) IMPLANT
SUT VIC AB 3-0 SH 8-18 (SUTURE) IMPLANT
SYR 50ML LL SCALE MARK (SYRINGE) ×4 IMPLANT
SYR BULB IRRIG 60ML STRL (SYRINGE) IMPLANT
SYR MEDRAD MARK V 150ML (SYRINGE) ×4 IMPLANT
TAPE CLOTH SURG 4X10 WHT LF (GAUZE/BANDAGES/DRESSINGS) ×2 IMPLANT
TOWEL GREEN STERILE (TOWEL DISPOSABLE) ×6 IMPLANT
TRANSDUCER W/STOPCOCK (MISCELLANEOUS) ×8 IMPLANT
TRAY FOLEY SLVR 14FR TEMP STAT (SET/KITS/TRAYS/PACK) IMPLANT
TUBE SUCT INTRACARD DLP 20F (MISCELLANEOUS) IMPLANT
VALVE HEART TRANSCATH SZ3 29MM (Valve) ×2 IMPLANT
WIRE EMERALD 3MM-J .035X150CM (WIRE) ×4 IMPLANT
WIRE EMERALD 3MM-J .035X260CM (WIRE) ×4 IMPLANT

## 2020-01-20 NOTE — Anesthesia Preprocedure Evaluation (Addendum)
Anesthesia Evaluation  Patient identified by MRN, date of birth, ID band Patient awake    Reviewed: Allergy & Precautions, NPO status , Patient's Chart, lab work & pertinent test results  Airway Mallampati: I  TM Distance: >3 FB Neck ROM: Full    Dental  (+) Edentulous Upper, Edentulous Lower   Pulmonary COPD,  COPD inhaler, former smoker,    breath sounds clear to auscultation       Cardiovascular hypertension, Pt. on home beta blockers + CAD, + CABG and + Peripheral Vascular Disease  + dysrhythmias Atrial Fibrillation + Valvular Problems/Murmurs AS  Rhythm:Regular Rate:Normal + Systolic murmurs    Neuro/Psych negative neurological ROS  negative psych ROS   GI/Hepatic negative GI ROS, Neg liver ROS,   Endo/Other  diabetes, Type 2, Oral Hypoglycemic AgentsHypothyroidism   Renal/GU Renal disease     Musculoskeletal negative musculoskeletal ROS (+)   Abdominal Normal abdominal exam  (+)   Peds  Hematology negative hematology ROS (+)   Anesthesia Other Findings   Reproductive/Obstetrics                           Anesthesia Physical Anesthesia Plan  ASA: IV  Anesthesia Plan: MAC   Post-op Pain Management:    Induction: Intravenous  PONV Risk Score and Plan: 0 and Propofol infusion and Ondansetron  Airway Management Planned: Natural Airway and Simple Face Mask  Additional Equipment: Arterial line  Intra-op Plan:   Post-operative Plan:   Informed Consent: I have reviewed the patients History and Physical, chart, labs and discussed the procedure including the risks, benefits and alternatives for the proposed anesthesia with the patient or authorized representative who has indicated his/her understanding and acceptance.       Plan Discussed with: CRNA  Anesthesia Plan Comments: (Echo:  1. Left ventricular ejection fraction, by estimation, is 55 to 60%. The  left ventricle has  normal function. The left ventricle has no regional  wall motion abnormalities. There is mild left ventricular hypertrophy.  Left ventricular diastolic parameters  are consistent with Grade I diastolic dysfunction (impaired relaxation).  2. Right ventricular systolic function is normal. The right ventricular  size is normal. Tricuspid regurgitation signal is inadequate for assessing  PA pressure.  3. Left atrial size was mildly dilated.  4. The mitral valve is grossly normal with mild annular calcification.  Trivial mitral valve regurgitation.  5. The aortic valve is tricuspid, moderately calcified with severely  reduced cusp excursion. Aortic valve regurgitation is not visualized.  Severe aortic valve stenosis. Aortic valve area, by VTI measures 0.62 cm.  Aortic valve mean gradient measures  52.0 mmHg. Aortic valve Vmax measures 4.61 m/s. Dimentionless index 0.22.  There has been significant progression in comparison to prior stuy in  2019.  6. The inferior vena cava is dilated in size with >50% respiratory  variability, suggesting right atrial pressure of 8 mmHg. )       Anesthesia Quick Evaluation

## 2020-01-20 NOTE — Progress Notes (Signed)
Rt groin fv sheath w/temporary pacing wire in place, 0.9NS at 10cc/hr to sheath

## 2020-01-20 NOTE — Transfer of Care (Signed)
Immediate Anesthesia Transfer of Care Note  Patient: Matthew May  Procedure(s) Performed: TRANSCATHETER AORTIC VALVE REPLACEMENT, TRANSFEMORALUSING EDWARDS SAPIEN 3 29 MM AORTIC THV. (N/A Chest) TRANSESOPHAGEAL ECHOCARDIOGRAM (TEE) (N/A )  Patient Location: Cath Lab  Anesthesia Type:MAC  Level of Consciousness: awake, alert  and oriented  Airway & Oxygen Therapy: Patient Spontanous Breathing and Patient connected to face mask oxygen  Post-op Assessment: Report given to RN, Post -op Vital signs reviewed and stable and Patient moving all extremities  Post vital signs: Reviewed and stable  Last Vitals:  Vitals Value Taken Time  BP 109/68 01/20/20 0939  Temp 36.3 C 01/20/20 0938  Pulse 59 01/20/20 0942  Resp 13 01/20/20 0942  SpO2 99 % 01/20/20 0942  Vitals shown include unvalidated device data.  Last Pain:  Vitals:   01/20/20 0938  TempSrc: Temporal  PainSc: Asleep      Patients Stated Pain Goal: 3 (04/20/51 0802)  Complications: No complications documented.

## 2020-01-20 NOTE — Progress Notes (Signed)
Mobility Specialist - Progress Note   01/20/20 1719  Mobility  Activity Ambulated in hall;Ambulated to bathroom  Level of Assistance Modified independent, requires aide device or extra time  Assistive Device Front wheel walker  Distance Ambulated (ft) 480 ft  Mobility Response Tolerated well  Mobility performed by Mobility specialist  $Mobility charge 1 Mobility    Pre-mobility: 80 HR, 105/71 BP During mobility: 88 HR, 93% SpO2 Post-mobility: 70 HR, 111/77 BP  Ambulation initiated by RN. I was unable to get a signal with the pulse oximeter after ambulation.   Pricilla Handler Mobility Specialist Mobility Specialist Phone: [phone]

## 2020-01-20 NOTE — Op Note (Addendum)
[facility]   MULTIDISCIPLINARY HEART VALVE TEAM   TAVR OPERATIVE NOTE   Date of Procedure:  01/20/2020  Preoperative Diagnosis: Severe Aortic Stenosis   Postoperative Diagnosis: Same   Procedure:    Transcatheter Aortic Valve Replacement - Percutaneous Left Transfemoral Approach  Edwards Sapien 3 THV (size 29 mm, model # 9600TFX, serial # [id number])   Co-Surgeons:  [name], MD and [name], MD   Anesthesiologist:  Wilfrid Lund, MD  Echocardiographer:             Edmonia James, MD  Pre-operative Echo Findings:  Severe aortic stenosis  Normal left ventricular systolic function  Post-operative Echo Findings:  No paravalvular leak  Normal left ventricular systolic function   BRIEF CLINICAL NOTE AND INDICATIONS FOR SURGERY  This 74 year old gentleman has stage D, severe, symptomatic aortic stenosis with New York Heart Association class III symptoms of shortness of breath, fatigue, and chest discomfort with minimal exertion consistent with chronic diastolic congestive heart failure. I have personally reviewed his 2D echocardiogram, cardiac catheterization, and CTA studies. Echocardiogram shows a severely calcified trileaflet aortic valve with a mean gradient of 52 mmHg and a valve area of 0.62 cm consistent with severe to critical aortic stenosis. Left ventricular systolic function is normal. Cardiac catheterization shows severe three-vessel coronary disease with a patent left internal mammary graft to the LAD and a patent sequential saphenous vein graft to the PDA and PL. The saphenous vein graft to the left circumflex is occluded with filling of the second marginal branch by right to left collaterals. The mean gradient across the aortic valve was measured at 59 mmHg consistent with critical aortic stenosis. I agree that aortic valve replacement is indicated in this patient for relief of his progressive symptoms and prevention of left ventricular  deterioration. Given his age and prior coronary bypass surgery I think transcatheter aortic valve replacement would be the best treatment for him. There is no indication for any coronary revascularization. His gated cardiac CTA shows anatomy suitable for transcatheter aortic valve replacement using a SAPIEN 3 valve. His abdominal and pelvic CTA shows adequate pelvic vascular anatomy to allow transfemoral insertion.   The patient and his wife were counseled at length regarding treatment alternatives for management of severe symptomatic aortic stenosis. The risks and benefits of surgical intervention has been discussed in detail. Long-term prognosis with medical therapy was discussed. Alternative approaches such as conventional surgical aortic valve replacement, transcatheter aortic valve replacement, and palliative medical therapy were compared and contrasted at length. This discussion was placed in the context of the patient's own specific clinical presentation and past medical history. All of their questions have been addressed.  Following the decision to proceed with transcatheter aortic valve replacement, a discussion was held regarding what types of management strategies would be attempted intraoperatively in the event of life-threatening complications, including whether or not the patient would be considered a candidate for the use of cardiopulmonary bypass and/or conversion to open sternotomy for attempted surgical intervention. The patient is aware of the fact that transient use of cardiopulmonary bypass may be necessary. He has had prior bypass surgery and I do not think he is a candidate for emergent redo sternotomy to manage any intraoperative complications. The patient has been advised of a variety of complications that might develop including but not limited to risks of death, stroke, paravalvular leak, aortic dissection or other major vascular complications, aortic annulus rupture, device  embolization, cardiac rupture or perforation, mitral regurgitation, acute  myocardial infarction, arrhythmia, heart block or bradycardia requiring permanent pacemaker placement, congestive heart failure, respiratory failure, renal failure, pneumonia, infection, other late complications related to structural valve deterioration or migration, or other complications that might ultimately cause a temporary or permanent loss of functional independence or other long term morbidity. The patient provides full informed consent for the procedure as described and all questions were answered.     DETAILS OF THE OPERATIVE PROCEDURE  PREPARATION:    The patient was brought to the operating room on the above mentioned date and appropriate monitoring was established by the anesthesia team. The patient was placed in the supine position on the operating table.  Intravenous antibiotics were administered. The patient was monitored closely throughout the procedure under conscious sedation.  Baseline transthoracic echocardiogram was performed. The patient's abdomen and both groins were prepped and draped in a sterile manner. A time out procedure was performed.   PERIPHERAL ACCESS:    Using the modified Seldinger technique, femoral arterial and venous access was obtained with placement of 6 Fr sheaths on the right side.  A pigtail diagnostic catheter was passed through the right arterial sheath under fluoroscopic guidance into the aortic root.  A temporary transvenous pacemaker catheter was passed through the right femoral venous sheath under fluoroscopic guidance into the right ventricle.  The pacemaker was tested to ensure stable lead placement and pacemaker capture. Aortic root angiography was performed in order to determine the optimal angiographic angle for valve deployment.   TRANSFEMORAL ACCESS:   Percutaneous transfemoral access and sheath placement was performed using ultrasound guidance.  The left common  femoral artery was cannulated using a micropuncture needle and appropriate location was verified using hand injection angiogram.  A pair of Abbott Perclose percutaneous closure devices were placed and a 6 French sheath replaced into the femoral artery.  The patient was heparinized systemically and ACT verified > 250 seconds.    A 16 Fr transfemoral E-sheath was introduced into the left common femoral artery after progressively dilating over an Amplatz superstiff wire. An AL-2 catheter was used to direct a straight-tip exchange length wire across the native aortic valve into the left ventricle. This was exchanged out for a pigtail catheter and position was confirmed in the LV apex. Simultaneous LV and Ao pressures were recorded.  The pigtail catheter was exchanged for an Amplatz Extra-stiff wire in the LV apex.    BALLOON AORTIC VALVULOPLASTY:   Not performed.   TRANSCATHETER HEART VALVE DEPLOYMENT:   An Edwards Sapien 3 transcatheter heart valve (size 29 mm, model #9600TFX, serial #[id number]) was prepared and crimped per manufacturer's guidelines, and the proper orientation of the valve is confirmed on the [employer] delivery system. 1 cc was taken out of the balloon volume since the valve was oversized. The valve was advanced through the introducer sheath using normal technique until in an appropriate position in the abdominal aorta beyond the sheath tip. The balloon was then retracted and using the fine-tuning wheel was centered on the valve. The valve was then advanced across the aortic arch using appropriate flexion of the catheter. The valve was carefully positioned across the aortic valve annulus. The Commander catheter was retracted using normal technique. Once final position of the valve has been confirmed by angiographic assessment, the valve is deployed while temporarily holding ventilation and during rapid ventricular pacing to maintain systolic blood pressure < 50 mmHg and pulse pressure  < 10 mmHg. The balloon inflation is held for >3 seconds after reaching full  deployment volume. Once the balloon has fully deflated the balloon is retracted into the ascending aorta and valve function is assessed using echocardiography. There is felt to be no paravalvular leak and no central aortic insufficiency.  Mean gradient was 2 mm Hg. The patient's hemodynamic recovery following valve deployment is good.  The deployment balloon and guidewire are both removed.    PROCEDURE COMPLETION:   The sheath was removed and femoral artery closure performed.  Protamine was administered once femoral arterial repair was complete. The temporary pacemaker, pigtail catheters and femoral sheaths were removed with manual pressure used for hemostasis.  A Mynx femoral closure device was utilized following removal of the diagnostic sheath in the right femoral artery.  The patient tolerated the procedure well and is transported to the cath lab recovery area in stable condition. There were no immediate intraoperative complications. All sponge instrument and needle counts are verified correct at completion of the operation.   No blood products were administered during the operation.  The patient received a total of 40 mL of intravenous contrast during the procedure.   [name], MD 01/20/2020

## 2020-01-20 NOTE — Interval H&P Note (Signed)
History and Physical Interval Note:  01/20/2020 6:54 AM  Matthew May  has presented today for surgery, with the diagnosis of Severe Aortic Stenosis.  The various methods of treatment have been discussed with the patient and family. After consideration of risks, benefits and other options for treatment, the patient has consented to  Procedure(s): TRANSCATHETER AORTIC VALVE REPLACEMENT, TRANSFEMORAL (N/A) TRANSESOPHAGEAL ECHOCARDIOGRAM (TEE) (N/A) as a surgical intervention.  The patient's history has been reviewed, patient examined, no change in status, stable for surgery.  I have reviewed the patient's chart and labs.  Questions were answered to the patient's satisfaction.     [name]

## 2020-01-20 NOTE — Anesthesia Procedure Notes (Signed)
Arterial Line Insertion Start/End8/17/2021 7:00 AM, 01/20/2020 7:10 AM Performed by: Amadeo Garnet, CRNA, CRNA  Patient location: Pre-op. Preanesthetic checklist: patient identified, IV checked, site marked, risks and benefits discussed, surgical consent, monitors and equipment checked, pre-op evaluation, timeout performed and anesthesia consent Lidocaine 1% used for infiltration Left, radial was placed Catheter size: 20 G Hand hygiene performed  and maximum sterile barriers used   Attempts: 1 Procedure performed without using ultrasound guided technique. Following insertion, dressing applied and Biopatch. Post procedure assessment: normal  Patient tolerated the procedure well with no immediate complications.

## 2020-01-20 NOTE — Op Note (Signed)
[facility]   MULTIDISCIPLINARY HEART VALVE TEAM   TAVR OPERATIVE NOTE   Date of Procedure:  01/20/2020  Preoperative Diagnosis: Severe Aortic Stenosis   Postoperative Diagnosis: Same   Procedure:    Transcatheter Aortic Valve Replacement - Percutaneous Transfemoral Approach  Edwards Sapien 3 THV (size 29 mm, model # 9600TFX, serial # [id number])   Co-Surgeons:  [name], MD and [name], MD  Anesthesiologist:  Suella Broad, MD  Echocardiographer:  Jenkins Rouge, MD  Pre-operative Echo Findings:  Severe aortic stenosis  Normal left ventricular systolic function  Post-operative Echo Findings:  No paravalvular leak  Normal left ventricular systolic function  BRIEF CLINICAL NOTE AND INDICATIONS FOR SURGERY  74 year old gentleman with severe, stage D aortic stenosis.  The patient has a history of remote CABG.  He has developed very severe aortic stenosis with mean pressure gradient of 59 mmHg.  He has undergone extensive multidisciplinary heart team evaluation and presents today for TAVR.  During the course of the patient's preoperative work up they have been evaluated comprehensively by a multidisciplinary team of specialists coordinated through the [facility] in the [facility].  They have been demonstrated to suffer from symptomatic severe aortic stenosis as noted above. The patient has been counseled extensively as to the relative risks and benefits of all options for the treatment of severe aortic stenosis including long term medical therapy, conventional surgery for aortic valve replacement, and transcatheter aortic valve replacement.  The patient has been independently evaluated in formal cardiac surgical consultation by Dr Cyndia Bent, who deemed the patient appropriate for TAVR. Based upon review of all of the patient's preoperative diagnostic tests they are felt to be candidate for transcatheter  aortic valve replacement using the transfemoral approach as an alternative to conventional surgery.    Following the decision to proceed with transcatheter aortic valve replacement, a discussion has been held regarding what types of management strategies would be attempted intraoperatively in the event of life-threatening complications, including whether or not the patient would be considered a candidate for the use of cardiopulmonary bypass and/or conversion to open sternotomy for attempted surgical intervention.  The patient has been advised of a variety of complications that might develop peculiar to this approach including but not limited to risks of death, stroke, paravalvular leak, aortic dissection or other major vascular complications, aortic annulus rupture, device embolization, cardiac rupture or perforation, acute myocardial infarction, arrhythmia, heart block or bradycardia requiring permanent pacemaker placement, congestive heart failure, respiratory failure, renal failure, pneumonia, infection, other late complications related to structural valve deterioration or migration, or other complications that might ultimately cause a temporary or permanent loss of functional independence or other long term morbidity.  The patient provides full informed consent for the procedure as described and all questions were answered preoperatively.  DETAILS OF THE OPERATIVE PROCEDURE  PREPARATION:   The patient is brought to the operating room on the above mentioned date and central monitoring was established by the anesthesia team including placement of a central venous catheter and radial arterial line. The patient is placed in the supine position on the operating table.  Intravenous antibiotics are administered. The patient is monitored closely throughout the procedure under conscious sedation.  Baseline transthoracic echocardiogram is performed. The patient's chest, abdomen, both groins, and both lower  extremities are prepared and draped in a sterile manner. A time out procedure is performed.  PERIPHERAL ACCESS:   Using ultrasound guidance, femoral arterial and  venous access is obtained with placement of 6 Fr sheaths on the right side.  A pigtail diagnostic catheter was passed through the femoral arterial sheath under fluoroscopic guidance into the aortic root.  A temporary transvenous pacemaker catheter was passed through the femoral venous sheath under fluoroscopic guidance into the right ventricle.  The pacemaker was tested to ensure stable lead placement and pacemaker capture. Aortic root angiography was performed in order to determine the optimal angiographic angle for valve deployment.  TRANSFEMORAL ACCESS:  A micropuncture technique is used to access the left femoral artery under fluoroscopic and ultrasound guidance.  2 Perclose devices are deployed at 10' and 2' positions to 'PreClose' the femoral artery. An 8 French sheath is placed and then an Amplatz Superstiff wire is advanced through the sheath. This is changed out for a 16 French transfemoral E-Sheath after progressively dilating over the Superstiff wire.  An AL-2 catheter was used to direct a straight-tip exchange length wire across the native aortic valve into the left ventricle. This was exchanged out for a pigtail catheter and position was confirmed in the LV apex. Simultaneous LV and Ao pressures were recorded.  The pigtail catheter was exchanged for an Amplatz Extra-stiff wire in the LV apex.    BALLOON AORTIC VALVULOPLASTY:  Not performed  TRANSCATHETER HEART VALVE DEPLOYMENT:  An Edwards Sapien 3 transcatheter heart valve (size 29 mm) was prepared and crimped per manufacturer's guidelines, and the proper orientation of the valve is confirmed on the [employer] delivery system. The valve was advanced through the introducer sheath using normal technique until in an appropriate position in the abdominal aorta beyond the sheath  tip. The balloon was then retracted and using the fine-tuning wheel was centered on the valve. The valve was then advanced across the aortic arch using appropriate flexion of the catheter. The valve was carefully positioned across the aortic valve annulus. The Commander catheter was retracted using normal technique. Once final position of the valve has been confirmed by angiographic assessment, the valve is deployed while temporarily holding ventilation and during rapid ventricular pacing to maintain systolic blood pressure < 50 mmHg and pulse pressure < 10 mmHg. The balloon inflation is held for >3 seconds after reaching full deployment volume. Once the balloon has fully deflated the balloon is retracted into the ascending aorta and valve function is assessed using echocardiography. The patient's hemodynamic recovery following valve deployment is good.  The deployment balloon and guidewire are both removed. Echo demostrated acceptable post-procedural gradients, stable mitral valve function, and no aortic insufficiency.    PROCEDURE COMPLETION:  The sheath was removed and femoral artery closure is performed using the 2 previously deployed Perclose devices.  Protamine is administered once femoral arterial repair was complete. The site is clear with no evidence of bleeding or hematoma after the sutures are tightened. The temporary pacemaker and pigtail catheters are removed. Mynx closure is used for contralateral femoral arterial hemostasis for the 6 Fr sheath.  The patient tolerated the procedure well and is transported to the surgical intensive care in stable condition. There were no immediate intraoperative complications. All sponge instrument and needle counts are verified correct at completion of the operation.   The patient received a total of 40 mL of intravenous contrast during the procedure.   [name], MD 01/20/2020 4:12 PM

## 2020-01-20 NOTE — Progress Notes (Signed)
Left radial art line removed @ 11:20:00 , manual pressure applied for 15 minutes. No bleeding or s+s of hematoma. Site level 0. Brisk capillary refill present.

## 2020-01-20 NOTE — Anesthesia Procedure Notes (Signed)
Procedure Name: MAC Date/Time: 01/20/2020 7:34 AM Performed by: Amadeo Garnet, CRNA Pre-anesthesia Checklist: Patient identified, Suction available, Emergency Drugs available and Patient being monitored Patient Re-evaluated:Patient Re-evaluated prior to induction Oxygen Delivery Method: Simple face mask Preoxygenation: Pre-oxygenation with 100% oxygen Induction Type: IV induction Placement Confirmation: positive ETCO2 Dental Injury: Teeth and Oropharynx as per pre-operative assessment

## 2020-01-20 NOTE — Progress Notes (Signed)
  Echocardiogram 2D Echocardiogram limited  TAVR has been performed.  Darlina Sicilian M 01/20/2020, 8:59 AM

## 2020-01-20 NOTE — Progress Notes (Signed)
62fr temporary pacemaker wire removed , and 12fr sheath aspirated and removed from right femoral vein. Manual pressure applied for 15 minutes. Site level 0, no s+s of hematoma. Tegaderm dressing applied, bedrest instructions given.  Bilateral dp and pt pulses present with doppler, left pt faint but present.  Bedrest for right groin begins at 11:05:00

## 2020-01-20 NOTE — Anesthesia Postprocedure Evaluation (Signed)
Anesthesia Post Note  Patient: Matthew May  Procedure(s) Performed: TRANSCATHETER AORTIC VALVE REPLACEMENT, TRANSFEMORALUSING EDWARDS SAPIEN 3 29 MM AORTIC THV. (N/A Chest) TRANSESOPHAGEAL ECHOCARDIOGRAM (TEE) (N/A )     Patient location during evaluation: PACU Anesthesia Type: MAC Level of consciousness: awake and alert Pain management: pain level controlled Vital Signs Assessment: post-procedure vital signs reviewed and stable Respiratory status: spontaneous breathing, nonlabored ventilation, respiratory function stable and patient connected to nasal cannula oxygen Cardiovascular status: stable and blood pressure returned to baseline Postop Assessment: no apparent nausea or vomiting Anesthetic complications: no   No complications documented.  Last Vitals:  Vitals:   01/20/20 1300 01/20/20 1430  BP: (!) 135/112   Pulse: 61   Resp: 18 17  Temp: 36.4 C   SpO2: 97%     Last Pain:  Vitals:   01/20/20 1300  TempSrc: Oral  PainSc:                  [name]

## 2020-01-20 NOTE — Discharge Instructions (Signed)
ACTIVITY AND EXERCISE °• Daily activity and exercise are an important part of your recovery. People recover at different rates depending on their general health and type of valve procedure. °• Most people recovering from TAVR feel better relatively quickly  °• No lifting, pushing, pulling more than 10 pounds (examples to avoid: groceries, vacuuming, gardening, golfing): °            - For one week with a procedure through the groin. °            - For six weeks for procedures through the chest wall or neck. °NOTE: You will typically see one of our providers 7-14 days after your procedure to discuss WHEN TO RESUME the above activities.  °  °  °DRIVING °• Do not drive until you are seen for follow up and cleared by a provider. Generally, we ask patient to not drive for 1 week after their procedure. °• If you have been told by your doctor in the past that you may not drive, you must talk with him/her before you begin driving again. °  °DRESSING °• Groin site: you may leave the clear dressing over the site for up to one week or until it falls off. °  °HYGIENE °• If you had a femoral (leg) procedure, you may take a shower when you return home. After the shower, pat the site dry. Do NOT use powder, oils or lotions in your groin area until the site has completely healed. °• If you had a chest procedure, you may shower when you return home unless specifically instructed not to by your discharging practitioner. °            - DO NOT scrub incision; pat dry with a towel. °            - DO NOT apply any lotions, oils, powders to the incision. °            - No tub baths / swimming for at least 2 weeks. °• If you notice any fevers, chills, increased pain, swelling, bleeding or pus, please contact your doctor. °  °ADDITIONAL INFORMATION °• If you are going to have an upcoming dental procedure, please contact our office as you will require antibiotics ahead of time to prevent infection on your heart valve.  ° ° °If you have any  questions or concerns you can call the structural heart phone during normal business hours 8am-4pm. If you have an urgent need after hours or weekends please call [phone] to talk to the on call provider for general cardiology. If you have an emergency that requires immediate attention, please call 911.  ° ° °After TAVR Checklist ° °Check  Test Description  ° Follow up appointment in 1-2 weeks  You will see our structural heart physician assistant, Katie Shoua Ressler. Your incision sites will be checked and you will be cleared to drive and resume all normal activities if you are doing well.    ° 1 month echo and follow up  You will have an echo to check on your new heart valve and be seen back in the office by Katie Anthea Udovich. Many times the echo is not read by your appointment time, but Katie will call you later that day or the following day to report your results.  ° Follow up with your primary cardiologist You will need to be seen by your primary cardiologist in the following 3-6 months after your 1 month appointment in the valve   clinic. Often times your Plavix or Aspirin will be discontinued during this time, but this is decided on a case by case basis.   ° 1 year echo and follow up You will have another echo to check on your heart valve after 1 year and be seen back in the office by Katie Sakiyah Shur. This your last structural heart visit.  ° Bacterial endocarditis prophylaxis  You will have to take antibiotics for the rest of your life before all dental procedures (even teeth cleanings) to protect your heart valve. Antibiotics are also required before some surgeries. Please check with your cardiologist before scheduling any surgeries. Also, please make sure to tell us if you have a penicillin allergy as you will require an alternative antibiotic.   ° ° °

## 2020-01-20 NOTE — Progress Notes (Signed)
Pt received from cath lab to 4e16. Oriented to room and call bell. Bilateral groins level 0. CHG bath complete. CCMD called, VSS. Will continue to monitor.  Arletta Bale, RN

## 2020-01-20 NOTE — Discharge Summary (Signed)
Physician Discharge Summary  Patient ID: Matthew May MRN: [id number] DOB/AGE: [birth year] 74 y.o.  Admit date: 01/20/2020 Discharge date: 01/21/2020  Admission Diagnoses:  Severe aortic stenosis CORONARY ATHEROSCLEROSIS NATIVE CORONARY ARTERY   S/P CABG   Hypothyroidism   Diabetes mellitus, type 2 ([place])   Subclavian artery stenosis, right (HCC)   Morbid obesity (HCC)   Bilateral carotid artery stenosis   Paroxysmal atrial fibrillation (HCC)   Chronic obstructive pulmonary disease (HCC)   HLD (hyperlipidemia)   HTN (hypertension)  Discharge Diagnoses:     Severe aortic stenosis   CORONARY ATHEROSCLEROSIS NATIVE CORONARY ARTERY   S/P CABG   Hypothyroidism   Diabetes mellitus, type 2 (HCC)   Subclavian artery stenosis, right (HCC)   Morbid obesity (HCC)   Bilateral carotid artery stenosis   Paroxysmal atrial fibrillation (HCC)   Chronic obstructive pulmonary disease (HCC)   HLD (hyperlipidemia)   HTN (hypertension)   S/P TAVR (transcatheter aortic valve replacement)   Discharged Condition: stable  History of Present Illness:  This 74 year old gentleman has stage D, severe, symptomatic aortic stenosis with New York Heart Association class III symptoms of shortness of breath, fatigue, and chest discomfort with minimal exertion consistent with chronic diastolic congestive heart failure. I have personally reviewed his 2D echocardiogram, cardiac catheterization, and CTA studies. Echocardiogram shows a severely calcified trileaflet aortic valve with a mean gradient of 52 mmHg and a valve area of 0.62 cmconsistent with severe to critical aortic stenosis. Left ventricular systolic function is normal. Cardiac catheterization shows severe three-vessel coronary disease with a patent left internal mammary graft to the LAD and a patent sequential saphenous vein graft to the PDA and PL. The saphenous vein graft to the left circumflex is occluded with filling of the second marginal branch  by right to left collaterals. The mean gradient across the aortic valve was measured at 59 mmHg consistent with critical aortic stenosis. I agree that aortic valve replacement is indicated in this patient for relief of his progressive symptoms and prevention of left ventricular deterioration. Given his age and prior coronary bypass surgery I think transcatheter aortic valve replacement would be the best treatment for him. There is no indication for any coronary revascularization. His gated cardiac CTA shows anatomy suitable for transcatheter aortic valve replacement using a SAPIEN 3 valve. His abdominal and pelvic CTA shows adequate pelvic vascular anatomy to allow transfemoral insertion.   The patient and his wife were counseled at length regarding treatment alternatives for management of severe symptomatic aortic stenosis. The risks and benefits of surgical intervention has been discussed in detail. Long-term prognosis with medical therapy was discussed. Alternative approaches such as conventional surgical aortic valve replacement, transcatheter aortic valve replacement, and palliative medical therapy were compared and contrasted at length. This discussion was placed in the context of the patient's own specific clinical presentation and past medical history. All of their questions have been addressed.  Following the decision to proceed with transcatheter aortic valve replacement, a discussion was held regarding what types of management strategies would be attempted intraoperatively in the event of life-threatening complications, including whether or not the patient would be considered a candidate for the use of cardiopulmonary bypass and/or conversion to open sternotomy for attempted surgical intervention. The patient is aware of the fact that transient use of cardiopulmonary bypass may be necessary. He has had prior bypass surgery and I do not think he is a candidate for emergent redo sternotomy to manage any  intraoperative complications. The patient has been  advised of a variety of complications that might develop including but not limited to risks of death, stroke, paravalvular leak, aortic dissection or other major vascular complications, aortic annulus rupture, device embolization, cardiac rupture or perforation, mitral regurgitation, acute myocardial infarction, arrhythmia, heart block or bradycardia requiring permanent pacemaker placement, congestive heart failure, respiratory failure, renal failure, pneumonia, infection, other late complications related to structural valve deterioration or migration, or other complications that might ultimately cause a temporary or permanent loss of functional independence or other long term morbidity. The patient provides full informed consent for the procedure as described and all questions were answered.   Hospital Course:   [name] presented to the hospital on 01/20/20 for elective same-day surgery. He was taken to the cath lab where transcatheter aortic valve replacement was accomplished without complication utilizing an [place] 3 THV bioprosthesis. Transthoracic echo imaging performed immediately following the valve deployment showed no perivalvular leak and no central aortic insufficiency.  Following the procedure he was transferred to [facility] where he remained stable.He was mobilized per protocol and permitted to resume oral intake making expected progress. A follow up ECHO was obtained on the first post-op day and demonstrated satisfactory function of the aortic valve prosthesis per Dr. Burt Knack. (The report was pending at the time of discharge.)   Consults: cardiology  Significant Diagnostic Studies:   ECHOCARDIOGRAM REPORT (pre-procedure)  Patient Name: Matthew May Date of Exam: 09/16/2019  Medical Rec #: [id number] Height: 66.5 in  Accession #: [id number] Weight: 242.0 lb  Date of Birth: Apr 23, 1946 BSA: 2.181 m  Patient Age: 74  years BP: 154/82 mmHg  Patient Gender: M HR: 81 bpm.  Exam Location: [facility]   Procedure: 2D Echo, Cardiac Doppler and Color Doppler   Indications: I35.0 (ICD-10-CM) - Nonrheumatic aortic valve stenosis   History: Patient has prior history of Echocardiogram examinations,  most  recent 01/08/2018. CAD, COPD, Arrythmias:Trigeminy and  Atrial  Fibrillation; Risk Factors:Hypertension, Diabetes and  Dyslipidemia. Aortic valve stenosis,Subclavian artery  stenosis,  right.   Sonographer: Alvino Chapel RCS  Referring Phys: [place]    1. Left ventricular ejection fraction, by estimation, is 55 to 60%. The  left ventricle has normal function. The left ventricle has no regional  wall motion abnormalities. There is mild left ventricular hypertrophy.  Left ventricular diastolic parameters  are consistent with Grade I diastolic dysfunction (impaired relaxation).  2. Right ventricular systolic function is normal. The right ventricular  size is normal. Tricuspid regurgitation signal is inadequate for assessing  PA pressure.  3. Left atrial size was mildly dilated.  4. The mitral valve is grossly normal with mild annular calcification.  Trivial mitral valve regurgitation.  5. The aortic valve is tricuspid, moderately calcified with severely  reduced cusp excursion. Aortic valve regurgitation is not visualized.  Severe aortic valve stenosis. Aortic valve area, by VTI measures 0.62 cm.  Aortic valve mean gradient measures  52.0 mmHg. Aortic valve Vmax measures 4.61 m/s. Dimentionless index 0.22.  There has been significant progression in comparison to prior stuy in  2019.  6. The inferior vena cava is dilated in size with >50% respiratory  variability, suggesting right atrial pressure of 8 mmHg.   FINDINGS  Left Ventricle: Left ventricular ejection fraction, by estimation, is 55  to 60%. The left ventricle has normal function. The left ventricle has no   regional wall motion abnormalities. The left ventricular internal cavity  size was normal in size. There is  mild left ventricular hypertrophy. Left ventricular diastolic parameters  are consistent with Grade I diastolic dysfunction (impaired relaxation).   Right Ventricle: The right ventricular size is normal. No increase in  right ventricular wall thickness. Right ventricular systolic function is  normal. Tricuspid regurgitation signal is inadequate for assessing PA  pressure.   Left Atrium: Left atrial size was mildly dilated.   Right Atrium: Right atrial size was normal in size.   Pericardium: There is no evidence of pericardial effusion. Presence of  pericardial fat pad.   Mitral Valve: The mitral valve is grossly normal. Mild mitral annular  calcification. Trivial mitral valve regurgitation.   Tricuspid Valve: The tricuspid valve is grossly normal. Tricuspid valve  regurgitation is trivial.   Aortic Valve: The aortic valve is tricuspid. Aortic valve regurgitation is  not visualized. Severe aortic stenosis is present. Moderate aortic valve  annular calcification. There is moderate calcification of the aortic  valve. Aortic valve mean gradient  measures 52.0 mmHg. Aortic valve peak gradient measures 85.0 mmHg. Aortic  valve area, by VTI measures 0.62 cm.   Pulmonic Valve: The pulmonic valve was grossly normal. Pulmonic valve  regurgitation is trivial.   Aorta: The aortic root is normal in size and structure.   Venous: The inferior vena cava is dilated in size with greater than 50%  respiratory variability, suggesting right atrial pressure of 8 mmHg.   IAS/Shunts: No atrial level shunt detected by color flow Doppler.    LEFT VENTRICLE  PLAX 2D  LVIDd: 4.49 cm Diastology  LVIDs: 2.45 cm LV e' lateral: 6.64 cm/s  LV PW: 1.26 cm LV E/e' lateral: 19.7  LV IVS: 1.32 cm LV e' medial: 6.31 cm/s  LVOT diam: 1.90 cm LV E/e' medial: 20.8  LV SV: 65  LV SV Index: 30  LVOT  Area: 2.84 cm    RIGHT VENTRICLE  RV Mid diam: 3.27 cm  RV S prime: 6.58 cm/s  TAPSE (M-mode): 1.5 cm   LEFT ATRIUM Index  LA diam: 3.80 cm 1.74 cm/m  LA Vol (A2C): 68.5 ml 31.41 ml/m  LA Vol (A4C): 70.6 ml 32.37 ml/m  LA Biplane Vol: 69.5 ml 31.87 ml/m  AORTIC VALVE  AV Area (Vmax): 0.54 cm  AV Area (Vmean): 0.53 cm  AV Area (VTI): 0.62 cm  AV Vmax: 461.00 cm/s  AV Vmean: 336.000 cm/s  AV VTI: 1.040 m  AV Peak Grad: 85.0 mmHg  AV Mean Grad: 52.0 mmHg  LVOT Vmax: 87.50 cm/s  LVOT Vmean: 62.400 cm/s  LVOT VTI: 0.228 m  LVOT/AV VTI ratio: 0.22   AORTA  Ao Root diam: 4.00 cm   MITRAL VALVE  MV Area (PHT): 2.39 cm SHUNTS  MV Decel Time: 318 msec Systemic VTI: 0.23 m  MV E velocity: 131.00 cm/s Systemic Diam: 1.90 cm  MV A velocity: 151.00 cm/s  MV E/A ratio: 0.87   Rozann Lesches MD  Electronically signed by Rozann Lesches MD  Signature Date/Time: 09/16/2019/12:28:57 PM     RIGHT/LEFT HEART CATH AND CORONARY/GRAFT ANGIOGRAPHY  Conclusion   Severe calcific aortic stenosis. Peak to peak gradient 69 mmHg with mean gradient 59 mmHg. Aortic valve area 0.78 cm. Relatively high output state documented by both Fick (7.2 L/min) and thermal dilution (7.99 L/min)   Occluded saphenous vein graft to the circumflex.   Patent sequential saphenous vein graft to the PDA and PL.   Widely patent LIMA to LAD.   Widely patent native left main.   Patent LAD with competitive flow  into the distal LAD from LIMA.   Severe diffuse disease in the mid to distal circumflex with functional total occlusion and collateralization of the second obtuse marginal from right to left collaterals.   Total occlusion mid RCA   Normal pulmonary wedge pressure and mildly elevated pulmonary artery pressures (mean pressure 23 mmHg).Marland Kitchen RECOMMENDATIONS   Resume Eliquis a.m. 12/31/2019.   Forward data to the TAVR valve clinic, Dr. Burt Knack, and Dr. Arvid Right. Surgeon Notes    12/30/2019  9:10 AM CV Procedure signed by Belva Crome, MD     Treatments:   TAVR OPERATIVE NOTE   Date of Procedure:                01/20/2020  Preoperative Diagnosis:      Severe Aortic Stenosis   Postoperative Diagnosis:    Same   Procedure:        Transcatheter Aortic Valve Replacement - Percutaneous Left Transfemoral Approach             [place] Sapien 3 THV (size 29 mm, model # 9600TFX, serial # [id number])              Co-Surgeons:                        [name], MD and [name], MD   Anesthesiologist:                  Wilfrid Lund, MD  Echocardiographer:              Edmonia James, MD  Pre-operative Echo Findings: ? Severe aortic stenosis ? Normal left ventricular systolic function  Post-operative Echo Findings: ? No paravalvular leak ? Normal left ventricular systolic function   Discharge Exam: Blood pressure (!) 152/69, pulse 66, temperature 97.9 F (36.6 C), temperature source Oral, resp. rate 14, height 5' 7.5" (1.715 m), weight 111.4 kg, SpO2 95 %.  General appearance: alert and cooperative Neurologic: intact Heart: regular rate and rhythm, S1, S2 normal, no murmur Lungs: clear to auscultation bilaterally Abdomen: soft, non-tender Extremities: extremities normal, no cyanosis or edema Wound: groin sites look good.  Disposition:  Discharged to home in stable condition.   Allergies as of 01/21/2020      Reactions   Prednisone Other (See Comments)   Pt states "sugar went to 580"   Januvia [sitagliptin] Nausea And Vomiting   Jardiance [empagliflozin] Rash   Lisinopril Cough      Medication List    TAKE these medications   acetaminophen 325 MG tablet Commonly known as: TYLENOL Take 2 tablets (650 mg total) by mouth every 6 (six) hours as needed for mild pain (or Fever >/= 101).   albuterol 108 (90 Base) MCG/ACT inhaler Commonly known as: VENTOLIN HFA Inhale 2 puffs into the lungs every 6 (six) hours as needed for wheezing or shortness of  breath.   apixaban 5 MG Tabs tablet Commonly known as: Eliquis Take 1 tablet (5 mg total) by mouth 2 (two) times daily.   aspirin 81 MG chewable tablet Chew 1 tablet (81 mg total) by mouth daily. Start taking on: January 22, 2020   atorvastatin 40 MG tablet Commonly known as: LIPITOR TAKE 1 TABLET BY MOUTH  DAILY   Breo Ellipta 100-25 MCG/INH Aepb Generic drug: fluticasone furoate-vilanterol Inhale 1 puff by mouth once daily What changed:   how much to take  how to take this  when to take this  additional instructions  cholecalciferol 25 MCG (1000 UNIT) tablet Commonly known as: VITAMIN D3 Take 1,000 Units by mouth daily.   colchicine 0.6 MG tablet Take 1 tablet (0.6 mg total) by mouth daily. 2 tablets now and then 1 tablet in one hour. Then one tablet daily until symptoms resolve. What changed:   how much to take  when to take this  reasons to take this  additional instructions   glimepiride 2 MG tablet Commonly known as: AMARYL Take 1 tablet (2 mg total) by mouth daily with breakfast.   levothyroxine 150 MCG tablet Commonly known as: SYNTHROID TAKE 1 TABLET BY MOUTH  DAILY What changed: when to take this   metFORMIN 1000 MG tablet Commonly known as: GLUCOPHAGE Take 1 tablet (1,000 mg total) by mouth 2 (two) times daily.   metoprolol tartrate 50 MG tablet Commonly known as: LOPRESSOR Take as directed prior to 01/01/20 CT scans   nitroGLYCERIN 0.4 MG SL tablet Commonly known as: NITROSTAT DISSOLVE ONE TABLET UNDER THE TONGUE EVERY 5 MINUTES AS NEEDED FOR CHEST PAIN.  DO NOT EXCEED A TOTAL OF 3 DOSES IN 15 MINUTES   OneTouch Delica Plus [id number] Misc USE DAILY   OneTouch Verio test strip Generic drug: glucose blood Test BS daily Dx E11.9   potassium chloride SA 20 MEQ tablet Commonly known as: KLOR-CON TAKE 1 TABLET BY MOUTH  DAILY   TART CHERRY ADVANCED PO Take 1 tablet by mouth daily.   torsemide 20 MG tablet Commonly known as:  DEMADEX Take 2 tablets (40 mg total) by mouth daily.   valsartan 40 MG tablet Commonly known as: DIOVAN TAKE ONE-HALF TABLET BY  MOUTH DAILY       Follow-up Information    Eileen Stanford, PA-C. Go on 01/28/2020.   Specialties: Cardiology, Radiology Why: @ 1:30pm, please arrive at least 10 minutes early.  Contact information: [address] [place] [address] [phone]               Signed: Antony Odea, PA-C 01/21/2020, 8:37 AM

## 2020-01-21 ENCOUNTER — Inpatient Hospital Stay (HOSPITAL_COMMUNITY): Payer: Medicare Other

## 2020-01-21 ENCOUNTER — Encounter (HOSPITAL_COMMUNITY): Payer: Self-pay | Admitting: Cardiovascular Disease

## 2020-01-21 DIAGNOSIS — Z952 Presence of prosthetic heart valve: Secondary | ICD-10-CM

## 2020-01-21 DIAGNOSIS — I350 Nonrheumatic aortic (valve) stenosis: Secondary | ICD-10-CM

## 2020-01-21 DIAGNOSIS — Z954 Presence of other heart-valve replacement: Secondary | ICD-10-CM

## 2020-01-21 LAB — BASIC METABOLIC PANEL
Anion gap: 8 (ref 5–15)
BUN: 11 mg/dL (ref 8–23)
CO2: 26 mmol/L (ref 22–32)
Calcium: 8.7 mg/dL — ABNORMAL LOW (ref 8.9–10.3)
Chloride: 101 mmol/L (ref 98–111)
Creatinine, Ser: 0.85 mg/dL (ref 0.61–1.24)
GFR calc Af Amer: >60 mL/min (ref >60)
GFR calc non Af Amer: >60 mL/min (ref >60)
Glucose, Bld: 164 mg/dL — ABNORMAL HIGH (ref 70–99)
Potassium: 3.8 mmol/L (ref 3.5–5.1)
Sodium: 135 mmol/L (ref 135–145)

## 2020-01-21 LAB — CBC
HCT: 38.1 % — ABNORMAL LOW (ref 39.0–52.0)
Hemoglobin: 12.4 g/dL — ABNORMAL LOW (ref 13.0–17.0)
MCH: 28.8 pg (ref 26.0–34.0)
MCHC: 32.5 g/dL (ref 30.0–36.0)
MCV: 88.6 fL (ref 80.0–100.0)
Platelets: 129 10*3/uL — ABNORMAL LOW (ref 150–400)
RBC: 4.3 MIL/uL (ref 4.22–5.81)
RDW: 13.1 % (ref 11.5–15.5)
WBC: 9.6 10*3/uL (ref 4.0–10.5)
nRBC: 0 % (ref 0.0–0.2)

## 2020-01-21 LAB — ECHOCARDIOGRAM COMPLETE
AR max vel: 1.4 cm2
AV Area VTI: 1.64 cm2
AV Area mean vel: 1.43 cm2
AV Mean grad: 7.3 mmHg
AV Peak grad: 14.5 mmHg
Ao pk vel: 1.91 m/s
Area-P 1/2: 2.8 cm2
Height: 67.5 in
S' Lateral: 3.32 cm
Weight: 3929.48 oz

## 2020-01-21 LAB — GLUCOSE, CAPILLARY
Glucose-Capillary: 169 mg/dL — ABNORMAL HIGH (ref 70–99)
Glucose-Capillary: 211 mg/dL — ABNORMAL HIGH (ref 70–99)

## 2020-01-21 LAB — MAGNESIUM: Magnesium: 1.9 mg/dL (ref 1.7–2.4)

## 2020-01-21 MED ORDER — ASPIRIN 81 MG PO CHEW: 81.0000 mg | CHEWABLE_TABLET | Freq: Every day | ORAL | Status: DC

## 2020-01-21 MED ORDER — ACETAMINOPHEN 325 MG PO TABS: 650.0000 mg | ORAL_TABLET | Freq: Four times a day (QID) | ORAL | Status: DC | PRN

## 2020-01-21 NOTE — Progress Notes (Signed)
Discharge instructions given to patient. IV removed, clean and intact. Rolling walker delivered to pt room. Medications and wound care reviewed. All questions answered. Pt escorted home with wife.  Arletta Bale, RN

## 2020-01-21 NOTE — Progress Notes (Signed)
Mobility Specialist - Progress Note   01/21/20 1313  Mobility  Activity Ambulated in hall  Level of Assistance Modified independent, requires aide device or extra time  Assistive Device Front wheel walker  Distance Ambulated (ft) 250 ft  Mobility Response Tolerated well  Mobility performed by Mobility specialist  $Mobility charge 1 Mobility    Pre-mobility: 81 HR, 94% SpO2 Post-mobility: 80 HR, 98% SpO2  Pt wanted to end ambulation early as he wanted to have his imaging done.   Pricilla Handler Mobility Specialist Mobility Specialist Phone: [phone]

## 2020-01-21 NOTE — Progress Notes (Signed)
CARDIAC REHAB PHASE I   PRE:  Rate/Rhythm: 70 SR 1HB  BP:  Supine:   Sitting: 105/70  Standing:    SaO2: 98%RA  MODE:  Ambulation: 430 ft   POST:  Rate/Rhythm: 98 SR  1HB  BP:  Supine:   Sitting: 102/77  Standing:    SaO2: 96%RA 0810-0900 Pt walked 430 ft on RA with rolling walker and little assistance. Tolerated well. Stated his breathing is better. Did not need to stop and rest. Gave pt diabetic and heart healthy diets. Discussed some healthy food choices. Encouraged walking as tolerated. Pt would like to have rolling walker for home use. Notified pt's RN. Discussed CRP 2 and pt not interested so will not refer.  To recliner with call bell.   Graylon Good, RN BSN  01/21/2020 8:52 AM

## 2020-01-21 NOTE — Progress Notes (Signed)
  Echocardiogram 2D Echocardiogram has been performed.  Markail Diekman G Alphonse Asbridge 01/21/2020, 2:06 PM

## 2020-01-21 NOTE — Progress Notes (Signed)
1 Day Post-Op Procedure(s) (LRB): TRANSCATHETER AORTIC VALVE REPLACEMENT, TRANSFEMORALUSING EDWARDS SAPIEN 3 29 MM AORTIC THV. (N/A) TRANSESOPHAGEAL ECHOCARDIOGRAM (TEE) (N/A) Subjective:  No complaints. Ambulated this am and feels well. No SOB or chest pain. Groins feel ok  Monitor strips reviewed. Sinus with 1st degree AV block. No pauses or higher degree block.   Objective: Vital signs in last 24 hours: Temp:  [97.2 F (36.2 C)-98.3 F (36.8 C)] 97.9 F (36.6 C) (08/18 0021) Pulse Rate:  [56-66] 66 (08/18 0021) Cardiac Rhythm: Heart block (08/17 1935) Resp:  [12-23] 14 (08/18 0021) BP: (85-152)/(58-112) 152/69 (08/18 0021) SpO2:  [92 %-99 %] 95 % (08/18 0021) Weight:  [111.4 kg] 111.4 kg (08/18 0500)  Hemodynamic parameters for last 24 hours:    Intake/Output from previous day: 08/17 0701 - 08/18 0700 In: 2509.5 [P.O.:360; I.V.:1449.5; IV Piggyback:700] Out: 2855 [Urine:2850; Blood:5] Intake/Output this shift: No intake/output data recorded.  General appearance: alert and cooperative Neurologic: intact Heart: regular rate and rhythm, S1, S2 normal, no murmur Lungs: clear to auscultation bilaterally Abdomen: soft, non-tender Extremities: extremities normal, no cyanosis or edema Wound: groin sites look good.  Lab Results: Recent Labs    01/20/20 0957 01/21/20 0619  WBC  --  9.6  HGB 11.9* 12.4*  HCT 35.0* 38.1*  PLT  --  129*   BMET:  Recent Labs    01/20/20 0957 01/21/20 0619  NA 140 135  K 4.7 3.8  CL 101 101  CO2  --  26  GLUCOSE 224* 164*  BUN 14 11  CREATININE 0.60* 0.85  CALCIUM  --  8.7*    PT/INR: No results for input(s): LABPROT, INR in the last 72 hours. ABG    Component Value Date/Time   PHART 7.304 (L) 01/20/2020 0907   HCO3 28.9 (H) 01/20/2020 0907   TCO2 24 01/20/2020 0957   ACIDBASEDEF 1.0 10/01/2009 1529   O2SAT 95.0 01/20/2020 0907   CBG (last 3)  Recent Labs    01/20/20 1628 01/20/20 2126 01/21/20 0626  GLUCAP 159*  270* 169*   ECG pending this am.  Assessment/Plan: S/P Procedure(s) (LRB): TRANSCATHETER AORTIC VALVE REPLACEMENT, TRANSFEMORALUSING EDWARDS SAPIEN 3 29 MM AORTIC THV. (N/A) TRANSESOPHAGEAL ECHOCARDIOGRAM (TEE) (N/A)  POD 1 Doing well in sinus rhythm.  Resume previous meds at discharge. ASA 81 mg and Eliquis  2D echo this am ECG this am.  Plan home with wife after these are done.   LOS: 1 day    [name] 01/21/2020

## 2020-01-21 NOTE — Progress Notes (Signed)
Inpatient Diabetes Program Recommendations  AACE/ADA: New Consensus Statement on Inpatient Glycemic Control (2015)  Target Ranges:  Prepandial:   less than 140 mg/dL      Peak postprandial:   less than 180 mg/dL (1-2 hours)      Critically ill patients:  140 - 180 mg/dL   Lab Results  Component Value Date   GLUCAP 211 (H) 01/21/2020   HGBA1C 6.8 (H) 01/16/2020    Review of Glycemic Control Results for Matthew May, Matthew May (MRN [id number]) as of 01/21/2020 11:35  Ref. Range 01/20/2020 21:26 01/21/2020 06:26 01/21/2020 11:18  Glucose-Capillary Latest Ref Range: 70 - 99 mg/dL 270 (H) 169 (H) 211 (H)   Diabetes history: Type 2 DM Outpatient Diabetes medications: Amaryl 2 mg QAM, Metformin 1000 mg BID Current orders for Inpatient glycemic control: Novolog 0-24 units TID & HS  Inpatient Diabetes Program Recommendations:    If to remain inpatient, consider adding Levemir 10 units QD.   Thanks, Bronson Curb, MSN, RNC-OB Diabetes Coordinator [phone] (8a-5p)

## 2020-01-21 NOTE — Progress Notes (Addendum)
      [address]       [place] [address]             [phone]      1 Day Post-Op Procedure(s) (LRB): TRANSCATHETER AORTIC VALVE REPLACEMENT, TRANSFEMORALUSING EDWARDS SAPIEN 3 29 MM AORTIC THV. (N/A) TRANSESOPHAGEAL ECHOCARDIOGRAM (TEE) (N/A) Subjective: Sitting up eating breakfast and talking on the phone.  Says he feels good andhas no complaints or concerns. He commented that the swelling has decreased in both ankles.   Objective: Vital signs in last 24 hours: Temp:  [97.2 F (36.2 C)-98.3 F (36.8 C)] 97.9 F (36.6 C) (08/18 0021) Pulse Rate:  [56-66] 66 (08/18 0021) Cardiac Rhythm: Heart block (08/17 1935) Resp:  [12-23] 14 (08/18 0021) BP: (85-152)/(58-112) 152/69 (08/18 0021) SpO2:  [92 %-99 %] 95 % (08/18 0021) Weight:  [111.4 kg] 111.4 kg (08/18 0500)   Intake/Output from previous day: 08/17 0701 - 08/18 0700 In: 2509.5 [P.O.:360; I.V.:1449.5; IV Piggyback:700] Out: 2855 [Urine:2850; Blood:5] Intake/Output this shift: No intake/output data recorded.  General appearance: alert, cooperative and no distress Neurologic: intact Heart: RRR, monitor shows NSR with no significant arhythmias. Soft systolic murmur.  Lungs: Breath sounds are CTA.  Extremities: Has 1-2+ swelling in LE's but he says this has improved significantly since before admission. Has dependent rubor. both feet are warm.  Wound: Left femoral access site is dry and soft. No sign of hematoma.   Lab Results: Recent Labs    01/20/20 0957 01/21/20 0619  WBC  --  9.6  HGB 11.9* 12.4*  HCT 35.0* 38.1*  PLT  --  129*   BMET:  Recent Labs    01/20/20 0957 01/21/20 0619  NA 140 135  K 4.7 3.8  CL 101 101  CO2  --  26  GLUCOSE 224* 164*  BUN 14 11  CREATININE 0.60* 0.85  CALCIUM  --  8.7*    PT/INR: No results for input(s): LABPROT, INR in the last 72 hours. ABG    Component Value Date/Time   PHART 7.304 (L) 01/20/2020 0907   HCO3 28.9 (H) 01/20/2020 0907   TCO2 24  01/20/2020 0957   ACIDBASEDEF 1.0 10/01/2009 1529   O2SAT 95.0 01/20/2020 0907   CBG (last 3)  Recent Labs    01/20/20 1628 01/20/20 2126 01/21/20 0626  GLUCAP 159* 270* 169*    Assessment/Plan: S/P Procedure(s) (LRB): TRANSCATHETER AORTIC VALVE REPLACEMENT, TRANSFEMORALUSING EDWARDS SAPIEN 3 29 MM AORTIC THV. (N/A) TRANSESOPHAGEAL ECHOCARDIOGRAM (TEE) (N/A)  -POD-1 TAVR for severe aortic stenosis. Progressing well. Awaiting ECHO and if OK will plan discharge later today.   -HTN- BP 150's this AM, irbesartan to resume this morning.    LOS: 1 day    Malon Kindle [phone] 01/21/2020   Chart reviewed, patient examined, agree with above.

## 2020-01-22 ENCOUNTER — Telehealth: Payer: Self-pay

## 2020-01-22 ENCOUNTER — Other Ambulatory Visit: Payer: Self-pay | Admitting: Family

## 2020-01-22 DIAGNOSIS — M109 Gout, unspecified: Secondary | ICD-10-CM

## 2020-01-22 DIAGNOSIS — M10071 Idiopathic gout, right ankle and foot: Secondary | ICD-10-CM

## 2020-01-22 NOTE — Telephone Encounter (Signed)
Patient contacted regarding discharge from [facility] on 01/21/2020.  Patient understands to follow up with provider Nell Range PA-C on 01/28/2020 at 1:30 PM at [facility] office. Patient understands discharge instructions? yes Patient understands medications and regiment? yes Patient understands to bring all medications to this visit? yes

## 2020-01-23 ENCOUNTER — Other Ambulatory Visit: Payer: Self-pay | Admitting: Family

## 2020-01-23 MED FILL — Potassium Chloride Inj 2 mEq/ML: INTRAVENOUS | Qty: 40 | Status: AC

## 2020-01-23 MED FILL — Heparin Sodium (Porcine) Inj 1000 Unit/ML: INTRAMUSCULAR | Qty: 30 | Status: AC

## 2020-01-23 MED FILL — Norepinephrine-NaCl IV Solution 4 MG/250ML-0.9%: INTRAVENOUS | Qty: 250 | Status: CN

## 2020-01-23 MED FILL — Magnesium Sulfate Inj 50%: INTRAMUSCULAR | Qty: 10 | Status: AC

## 2020-01-26 NOTE — Progress Notes (Signed)
[facility]                                       Cardiology Office Note    Date:  01/28/2020   ID:  Matthew May, DOB 10-Dec-1945, MRN [id number]  PCP:  Sharion Balloon, FNP  Cardiologist:  Dr. Michelle Piper / Dr. Burt Knack & Dr. Cyndia Bent (TAVR)  CC: [facility] s/p TAVR  History of Present Illness:  Matthew May is a 74 y.o. 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   He has a history of coronary artery disease status post coronary artery bypass graft surgery x4 by Dr. Cyndia Bent in 2011 after a NSTEMI. He had a LIMA--> LAD, SVG--> OM, and sequential SVG-->PDA and PLA placed.  He has done well since his surgery but over the past 12 months has developed progressive exertional fatigue, shortness of breath, and chest discomfort. [facility] 12/30/19 showed occluded SVG-> LCx, patent sequential SVG -->PDA and PL, widely patent LIMA -->LAD, widely patent native left main. There was a patent LAD with competitive flow into the distal LAD from LIMA and severe diffuse disease in the mid to distal circumflex with functional total occlusion and collateralization of the second obtuse marginal from right to left collaterals. There was total occlusion mid RCA.   He was evaluated by the multidisciplinary valve team and underwent successful TAVR with a 29 mm Edwards Sapien 3 THV via the TF approach on 01/20/20. Post operative echo showed EF 60-65%, normally functioning TAVR with a mean gradient of 7 mmHg and no PVL. He was discharged home on Eliquis with the addition of a baby aspirin.   Today he presents to clinic for follow up. Here with his wife. He is doing quite well with a big improvement in his symptoms since TAVR. He has had  improved LE edema, breathing and energy. He has a had a couple episodes where he turns his head and gets dizzy.    Past Medical History:  Diagnosis Date  . Atrial fibrillation ([place])   . CAD (coronary artery disease)    a. s/p NSTEMI 4/11 => s/p CABG (L-LAD, S-OM2, S-PDA/PL);  b.  ETT-Myoview 6/14:  normal study, no ischemia, EF 67%  . Carotid stenosis    Carotid U/S 5/95:  RICA 6-38%, LICA 75-64%; right vertebral flow retrograde suggestive of steel-consider PV consult  . COPD (chronic obstructive pulmonary disease) ([place])   . Diabetes mellitus without complication ([place])   . HLD (hyperlipidemia)   . HTN (hypertension)   . Obesity   . Renal insufficiency   . Severe aortic stenosis   . Subclavian artery stenosis, right ([place])    based upon carotid U/S done 11/2012    Past Surgical History:  Procedure Laterality Date  . CORONARY ARTERY BYPASS GRAFT     2011, LIMA to LAD coronary artery, SVG to OM2 branch of lect circumflex coronary artery, and a sequential SVG to psot descening to posterolateral branches to RCA  . endoscopic vein harvesting     right leg   . HERNIA REPAIR    . RIGHT/LEFT HEART CATH AND CORONARY/GRAFT ANGIOGRAPHY  N/A 12/31/2017   Procedure: RIGHT/LEFT HEART CATH AND CORONARY/GRAFT ANGIOGRAPHY;  Surgeon: Leonie Man, MD;  Location: [facility];  Service: Cardiovascular;  Laterality: N/A;  . RIGHT/LEFT HEART CATH AND CORONARY/GRAFT ANGIOGRAPHY N/A 12/30/2019   Procedure: RIGHT/LEFT HEART CATH AND CORONARY/GRAFT ANGIOGRAPHY;  Surgeon: Belva Crome, MD;  Location: [facility];  Service: Cardiovascular;  Laterality: N/A;  . TEE WITHOUT CARDIOVERSION N/A 01/20/2020   Procedure: TRANSESOPHAGEAL ECHOCARDIOGRAM (TEE);  Surgeon: [name], MD;  Location: [facility];  Service: Open Heart Surgery;  Laterality: N/A;  . TONSILLECTOMY    . TRANSCATHETER AORTIC VALVE REPLACEMENT, TRANSFEMORAL N/A 01/20/2020   Procedure: TRANSCATHETER AORTIC VALVE REPLACEMENT,  TRANSFEMORALUSING EDWARDS SAPIEN 3 29 MM AORTIC THV.;  Surgeon: [name], MD;  Location: [facility];  Service: Open Heart Surgery;  Laterality: N/A;    Current Medications: Outpatient Medications Prior to Visit  Medication Sig Dispense Refill  . acetaminophen (TYLENOL) 325 MG tablet Take 2 tablets (650 mg total) by mouth every 6 (six) hours as needed for mild pain (or Fever >/= 101).    Marland Kitchen albuterol (PROVENTIL HFA;VENTOLIN HFA) 108 (90 Base) MCG/ACT inhaler Inhale 2 puffs into the lungs every 6 (six) hours as needed for wheezing or shortness of breath. 1 Inhaler 0  . apixaban (ELIQUIS) 5 MG TABS tablet Take 1 tablet (5 mg total) by mouth 2 (two) times daily. 60 tablet 1  . aspirin 81 MG chewable tablet Chew 1 tablet (81 mg total) by mouth daily.    Marland Kitchen atorvastatin (LIPITOR) 40 MG tablet TAKE 1 TABLET BY MOUTH  DAILY 90 tablet 3  . cholecalciferol (VITAMIN D3) 25 MCG (1000 UT) tablet Take 1,000 Units by mouth daily.    . colchicine 0.6 MG tablet Take 0.6 mg by mouth as needed.    . fluticasone furoate-vilanterol (BREO ELLIPTA) 100-25 MCG/INH AEPB Inhale 1 puff by mouth once daily 60 each 0  . glimepiride (AMARYL) 2 MG tablet Take 1 tablet by mouth once daily with breakfast 90 tablet 0  . glucose blood (ONETOUCH VERIO) test strip Test BS daily Dx E11.9 100 strip 3  . Lancets (ONETOUCH DELICA PLUS [id number]) MISC USE DAILY 100 each 3  . levothyroxine (SYNTHROID) 150 MCG tablet TAKE 1 TABLET BY MOUTH  DAILY 90 tablet 3  . metFORMIN (GLUCOPHAGE) 1000 MG tablet Take 1 tablet (1,000 mg total) by mouth 2 (two) times daily. 60 tablet 0  . Misc Natural Products (TART CHERRY ADVANCED PO) Take 1 tablet by mouth daily.     . nitroGLYCERIN (NITROSTAT) 0.4 MG SL tablet DISSOLVE ONE TABLET UNDER THE TONGUE EVERY 5 MINUTES AS NEEDED FOR CHEST PAIN.  DO NOT EXCEED A TOTAL OF 3 DOSES IN 15 MINUTES 25 tablet 0  . potassium chloride SA (KLOR-CON) 20 MEQ tablet TAKE 1 TABLET BY MOUTH  DAILY 90 tablet 3  . torsemide  (DEMADEX) 20 MG tablet Take 2 tablets (40 mg total) by mouth daily. 180 tablet 3  . valsartan (DIOVAN) 40 MG tablet TAKE ONE-HALF TABLET BY  MOUTH DAILY 45 tablet 1  . colchicine 0.6 MG tablet TAKE 2 TABLETS BY MOUTH NOW AND THEN 1 TABLET IN 1 HOUR, THEN 1 TABLET DAILY UNTIL SYMPTOMS RESOLVE 30 tablet 0  . metoprolol tartrate (LOPRESSOR) 50 MG tablet Take as directed prior to 01/01/20 CT scans (Patient not taking: Reported on 01/28/2020) 1 tablet 0   No facility-administered medications prior to visit.     Allergies:   Prednisone, Januvia [sitagliptin],  Jardiance [empagliflozin], and Lisinopril   Social History   Socioeconomic History  . Marital status: Married    Spouse name: Matthew May  . Number of children: 3  . Years of education: 8  . Highest education level: 8th grade  Occupational History  . Occupation: Retired    Comment: scrap yard  Tobacco Use  . Smoking status: Former Smoker    Quit date: 07/11/2009    Years since quitting: 10.5  . Smokeless tobacco: Never Used  . Tobacco comment: smoked about 10 cig/day; used to smoke 2 ppd for many years   Vaping Use  . Vaping Use: Never used  Substance and Sexual Activity  . Alcohol use: No    Alcohol/week: 0.0 standard drinks  . Drug use: No  . Sexual activity: Never  Other Topics Concern  . Not on file  Social History Narrative   Lives at home with his wife. He is retired but his wife continues to work daily. They have adult children that live locally and grandchildren that they spend time with regularly. He is active around his home and yard.    Social Determinants of Health   Financial Resource Strain:   . Difficulty of Paying Living Expenses: Not on file  Food Insecurity:   . Worried About Charity fundraiser in the Last Year: Not on file  . Ran Out of Food in the Last Year: Not on file  Transportation Needs:   . Lack of Transportation (Medical): Not on file  . Lack of Transportation (Non-Medical): Not on file  Physical  Activity:   . Days of Exercise per Week: Not on file  . Minutes of Exercise per Session: Not on file  Stress:   . Feeling of Stress : Not on file  Social Connections:   . Frequency of Communication with Friends and Family: Not on file  . Frequency of Social Gatherings with Friends and Family: Not on file  . Attends Religious Services: Not on file  . Active Member of Clubs or Organizations: Not on file  . Attends Archivist Meetings: Not on file  . Marital Status: Not on file     Family History:  The patient's family history includes Depression in his maternal aunt; Diabetes in his brother; Heart attack in his father; Heart disease in his father; Mental illness in his mother and sister.     ROS:   Please see the history of present illness.    ROS All other systems reviewed and are negative.   PHYSICAL EXAM:   VS:  BP (!) 152/82   Pulse 77   Ht 5' 7.5" (1.715 m)   Wt 246 lb 3.2 oz (111.7 kg)   SpO2 94%   BMI 37.99 kg/m    GEN: Well nourished, well developed, in no acute distress, obese HEENT: normal Neck: no JVD or masses Cardiac: RRR; no murmurs, rubs, or gallops,no edema  Respiratory:  clear to auscultation bilaterally, normal work of breathing GI: soft, nontender, nondistended, + BS MS: no deformity or atrophy Skin: warm and dry, no rash.  Groin sites clear without hematoma or ecchymosis  Neuro:  Alert and Oriented x 3, Strength and sensation are intact Psych: euthymic mood, full affect   Wt Readings from Last 3 Encounters:  01/28/20 246 lb 3.2 oz (111.7 kg)  01/21/20 245 lb 9.5 oz (111.4 kg)  01/16/20 250 lb (113.4 kg)      Studies/Labs Reviewed:   EKG:  EKG is ordered today.  The ekg ordered today demonstrates sinus with new wide LBBB ( qrs 148ms), 1st deg Av block (PR more prolonged) at 226ms). HR 73  Recent Labs: 10/17/2019: TSH 1.920 01/16/2020: ALT 49; B Natriuretic Peptide 90.6 01/21/2020: BUN 11; Creatinine, Ser 0.85; Hemoglobin 12.4; Magnesium  1.9; Platelets 129; Potassium 3.8; Sodium 135   Lipid Panel    Component Value Date/Time   CHOL 110 10/17/2019 0855   CHOL 138 12/26/2012 1310   TRIG 138 10/17/2019 0855   TRIG 90 05/23/2013 1452   TRIG 108 12/26/2012 1310   HDL 31 (L) 10/17/2019 0855   HDL 34 (L) 05/23/2013 1452   HDL 33 (L) 12/26/2012 1310   CHOLHDL 3.5 10/17/2019 0855   CHOLHDL 5.9 10/01/2009 0700   VLDL 16 10/01/2009 0700   LDLCALC 55 10/17/2019 0855   LDLCALC 81 05/23/2013 1452   LDLCALC 83 12/26/2012 1310    Additional studies/ records that were reviewed today include:  TAVR OPERATIVE NOTE   Date of Procedure:                01/20/2020  Preoperative Diagnosis:      Severe Aortic Stenosis   Postoperative Diagnosis:    Same   Procedure:        Transcatheter Aortic Valve Replacement - Percutaneous Left Transfemoral Approach             Edwards Sapien 3 THV (size 29 mm, model # 9600TFX, serial # [id number])              Co-Surgeons:                        [name], MD and [name], MD   Anesthesiologist:                  Wilfrid Lund, MD  Echocardiographer:              Edmonia James, MD  Pre-operative Echo Findings: ? Severe aortic stenosis ? Normal left ventricular systolic function  Post-operative Echo Findings: ? No paravalvular leak ? Normal left ventricular systolic function  _____________________  Echo 01/21/20:  IMPRESSIONS 1. Left ventricular ejection fraction, by estimation, is 60 to 65%. The left ventricle has normal function. The left ventricle has no regional wall motion abnormalities. There is severe left ventricular hypertrophy.  Left ventricular diastolic parameters  are consistent with Grade II diastolic dysfunction (pseudonormalization).  Elevated left ventricular end-diastolic pressure.  2. Right ventricular systolic function is normal. The right ventricular  size is normal.  3. Left atrial size was severely dilated.  4. The mitral valve is normal in  structure. Mild mitral valve  regurgitation. No evidence of mitral stenosis.  5. Post op day one TAVR with 29 mm Sapien 3 valve no significant PvL Peak  velocity 1.9 m/sec mean gradient 7 peak 14.5 mmHg AVA 1.6 cm2. The aortic  valve has been repaired/replaced. Aortic valve regurgitation is not  visualized. No aortic stenosis is  present. There is a 29 mm Edwards Sapien prosthetic (TAVR) valve present  in the aortic position. Procedure Date: 01/20/2020.  6. The inferior vena cava is normal in size with greater than 50%  respiratory variability, suggesting right atrial pressure of 3 mmHg.   ASSESSMENT & PLAN:   Severe AS s/p TAVR: doing well with a marked symptomatic improvement since TAVR. Continue on Eliquis and aspirin. SBE prophylaxis discussed; the patient is edentulous and does not go to the dentist. I will  see him back next month for echo and follow up.   HTN: BP 170/85 on my personal recheck. Currently on torsemide 40mg  daily and Diovan 20mg  daily. I have asked him to increase Diovan to 40mg  daily. He see's his PCP, Evelina Dun, next week. I will see if she can follow his BP and get a BMET to follow renal function and potassium. I suspect he will need further titration of his meds for optimal control. Would avoid AV nodal blocking agents with his conduction disease.   Dizziness: pt having some dizziness with head movements. Sounds vestibular in nature, but given new wide LBBB and progressive 1st deg AV block after TAVR, will place a Zio AT to rule out HAVB.   Medication Adjustments/Labs and Tests Ordered: Current medicines are reviewed at length with the patient today.  Concerns regarding medicines are outlined above.  Medication changes, Labs and Tests ordered today are listed in the Patient Instructions below. Patient Instructions  Medication Instructions:  1) INCREASE Valsartan to 40mg  once daily  *If you need a refill on your cardiac medications before your next appointment,  please call your pharmacy*   Lab Work: None If you have labs (blood work) drawn today and your tests are completely normal, you will receive your results only by: Marland Kitchen MyChart Message (if you have MyChart) OR . A paper copy in the mail If you have any lab test that is abnormal or we need to change your treatment, we will call you to review the results.   Testing/Procedures: Your physician recommends that you wear a monitor for 14 days.    Follow-Up: Keep your current follow up appointments as scheduled.     Signed, Angelena Form, PA-C  01/28/2020 2:12 PM    [facility] [place], [place], [place]  [address] Phone: [phone]; Fax: [phone]

## 2020-01-28 ENCOUNTER — Encounter: Payer: Self-pay | Admitting: Physician Assistant

## 2020-01-28 ENCOUNTER — Other Ambulatory Visit: Payer: Self-pay

## 2020-01-28 ENCOUNTER — Ambulatory Visit: Payer: Medicare Other | Admitting: Physician Assistant

## 2020-01-28 ENCOUNTER — Other Ambulatory Visit: Payer: Self-pay | Admitting: Family

## 2020-01-28 VITALS — BP 152/82 | HR 77 | Ht 67.5 in | Wt 246.2 lb

## 2020-01-28 DIAGNOSIS — I447 Left bundle-branch block, unspecified: Secondary | ICD-10-CM

## 2020-01-28 DIAGNOSIS — I10 Essential (primary) hypertension: Secondary | ICD-10-CM | POA: Diagnosis not present

## 2020-01-28 DIAGNOSIS — Z952 Presence of prosthetic heart valve: Secondary | ICD-10-CM | POA: Diagnosis not present

## 2020-01-28 DIAGNOSIS — R55 Syncope and collapse: Secondary | ICD-10-CM | POA: Diagnosis not present

## 2020-01-28 MED ORDER — VALSARTAN 40 MG PO TABS: 40.0000 mg | ORAL_TABLET | Freq: Every day | ORAL | 3 refills | Status: DC

## 2020-01-28 NOTE — Patient Instructions (Signed)
Medication Instructions:  1) INCREASE Valsartan to 40mg  once daily  *If you need a refill on your cardiac medications before your next appointment, please call your pharmacy*   Lab Work: None If you have labs (blood work) drawn today and your tests are completely normal, you will receive your results only by: Marland Kitchen MyChart Message (if you have MyChart) OR . A paper copy in the mail If you have any lab test that is abnormal or we need to change your treatment, we will call you to review the results.   Testing/Procedures: Your physician recommends that you wear a monitor for 14 days.    Follow-Up: Keep your current follow up appointments as scheduled.

## 2020-01-30 ENCOUNTER — Ambulatory Visit (INDEPENDENT_AMBULATORY_CARE_PROVIDER_SITE_OTHER): Payer: Medicare Other

## 2020-01-30 DIAGNOSIS — I447 Left bundle-branch block, unspecified: Secondary | ICD-10-CM | POA: Diagnosis not present

## 2020-01-30 DIAGNOSIS — R55 Syncope and collapse: Secondary | ICD-10-CM | POA: Diagnosis not present

## 2020-01-31 DIAGNOSIS — R55 Syncope and collapse: Secondary | ICD-10-CM | POA: Diagnosis not present

## 2020-01-31 DIAGNOSIS — I447 Left bundle-branch block, unspecified: Secondary | ICD-10-CM | POA: Diagnosis not present

## 2020-02-03 ENCOUNTER — Other Ambulatory Visit: Payer: Self-pay | Admitting: Cardiology

## 2020-02-12 ENCOUNTER — Ambulatory Visit (INDEPENDENT_AMBULATORY_CARE_PROVIDER_SITE_OTHER): Payer: Medicare Other | Admitting: Family

## 2020-02-12 ENCOUNTER — Other Ambulatory Visit: Payer: Self-pay

## 2020-02-12 ENCOUNTER — Encounter: Payer: Self-pay | Admitting: Family

## 2020-02-12 VITALS — BP 108/79 | HR 87 | Temp 97.2°F | Ht 67.5 in | Wt 249.4 lb

## 2020-02-12 DIAGNOSIS — I10 Essential (primary) hypertension: Secondary | ICD-10-CM

## 2020-02-12 DIAGNOSIS — E1165 Type 2 diabetes mellitus with hyperglycemia: Secondary | ICD-10-CM | POA: Diagnosis not present

## 2020-02-12 DIAGNOSIS — Z952 Presence of prosthetic heart valve: Secondary | ICD-10-CM

## 2020-02-12 DIAGNOSIS — J449 Chronic obstructive pulmonary disease, unspecified: Secondary | ICD-10-CM

## 2020-02-12 DIAGNOSIS — I480 Paroxysmal atrial fibrillation: Secondary | ICD-10-CM | POA: Diagnosis not present

## 2020-02-12 DIAGNOSIS — I2510 Atherosclerotic heart disease of native coronary artery without angina pectoris: Secondary | ICD-10-CM

## 2020-02-12 DIAGNOSIS — E785 Hyperlipidemia, unspecified: Secondary | ICD-10-CM

## 2020-02-12 DIAGNOSIS — I350 Nonrheumatic aortic (valve) stenosis: Secondary | ICD-10-CM

## 2020-02-12 DIAGNOSIS — I771 Stricture of artery: Secondary | ICD-10-CM

## 2020-02-12 DIAGNOSIS — E038 Other specified hypothyroidism: Secondary | ICD-10-CM

## 2020-02-12 LAB — BAYER DCA HB A1C WAIVED: HB A1C (BAYER DCA - WAIVED): 7.1 % — ABNORMAL HIGH (ref <7.0)

## 2020-02-12 NOTE — Progress Notes (Signed)
Subjective:    Patient ID: Matthew May, male    DOB: January 22, 1946, 74 y.o.   MRN: [id number]  Chief Complaint  Patient presents with  . Hypertension    4 MTH , HAD VAV=LVE SURGERY 08/18.  . Diabetes    STATES A MEDICATION IS MAKING HIM FEEL DIZZY NOT SURE WHICH ONE IT WOULD BE    Ptcallsthe office todaychronic follow up. Pt is followed by Cardiologists CAD, aortic stenosis, and A Fib. He currently has a heart monitor on now. 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 Doing well at this time.  Pt got his both COVID vaccine. Hypertension This is a chronic problem. The current episode started more than 1 year ago. The problem has been resolved since onset. The problem is controlled. Associated symptoms include peripheral edema. Pertinent negatives include no blurred vision, malaise/fatigue or shortness of breath. Risk factors for coronary artery disease include dyslipidemia, obesity, male gender and sedentary lifestyle. The current treatment provides moderate improvement. There is no history of CVA or heart failure. Identifiable causes of hypertension include a thyroid problem.  Diabetes He presents for his follow-up diabetic visit. He has type 2 diabetes mellitus. His disease course has been stable. Pertinent negatives for diabetes include no blurred vision and no foot paresthesias. Symptoms are stable. Diabetic complications include heart disease and peripheral neuropathy. Pertinent negatives for diabetic complications include no CVA or nephropathy. Risk factors for coronary artery disease include dyslipidemia, diabetes mellitus, male sex, hypertension and sedentary lifestyle. He is following a generally healthy diet. His overall blood glucose range is 180-200 mg/dl. An ACE inhibitor/angiotensin II receptor blocker is being taken.  Hyperlipidemia This is a chronic problem. The current episode started more than 1 year ago. The problem is uncontrolled. Recent lipid tests were  reviewed and are high. Exacerbating diseases include obesity. Pertinent negatives include no shortness of breath. Risk factors for coronary artery disease include diabetes mellitus, hypertension and a sedentary lifestyle.  Thyroid Problem Presents for follow-up visit. The symptoms have been stable. His past medical history is significant for hyperlipidemia. There is no history of heart failure.   Breo [employer] daily. Quit smoking 09/2009.   Review of Systems  Constitutional: Negative for malaise/fatigue.  Eyes: Negative for blurred vision.  Respiratory: Negative for shortness of breath.   All other systems reviewed and are negative.      Objective:   Physical Exam Vitals reviewed.  Constitutional:      General: He is not in acute distress.    Appearance: He is well-developed. He is obese.  HENT:     Head: Normocephalic.     Right Ear: Tympanic membrane normal.     Left Ear: Tympanic membrane normal.  Eyes:     General:        Right eye: No discharge.        Left eye: No discharge.     Pupils: Pupils are equal, round, and reactive to light.  Neck:     Thyroid: No thyromegaly.  Cardiovascular:     Rate and Rhythm: Normal rate and regular rhythm.     Heart sounds: Normal heart sounds. No murmur heard.   Pulmonary:     Effort: Pulmonary effort is normal. No respiratory distress.     Breath sounds: Normal breath sounds. No wheezing.  Abdominal:     General: Bowel sounds are normal. There is no distension.     Palpations: Abdomen is soft.     Tenderness: There is  no abdominal tenderness.  Musculoskeletal:        General: No tenderness. Normal range of motion.     Cervical back: Normal range of motion and neck supple.     Right lower leg: Edema (2+) present.     Left lower leg: Edema (2+) present.  Skin:    General: Skin is warm and dry.     Findings: No erythema or rash.  Neurological:     Mental Status: He is alert and oriented to person, place, and time.      Cranial Nerves: No cranial nerve deficit.     Deep Tendon Reflexes: Reflexes are normal and symmetric.  Psychiatric:        Behavior: Behavior normal.        Thought Content: Thought content normal.        Judgment: Judgment normal.       BP 108/79   Pulse 87   Temp (!) 97.2 F (36.2 C) (Temporal)   Ht 5' 7.5" (1.715 m)   Wt 249 lb 6.4 oz (113.1 kg)   SpO2 95%   BMI 38.49 kg/m      Assessment & Plan:  Matthew May comes in today with chief complaint of Hypertension (4 MTH , HAD VAV=LVE SURGERY 08/18.) and Diabetes (STATES A MEDICATION IS MAKING HIM FEEL DIZZY NOT SURE WHICH ONE IT WOULD BE )   Diagnosis and orders addressed:  1. Type 2 diabetes mellitus with hyperglycemia, without long-term current use of insulin (HCC) - Bayer DCA Hb A1c Waived  2. Atherosclerosis of native coronary artery of native heart without angina pectoris  3. Essential hypertension  4. Paroxysmal atrial fibrillation (HCC)  5. Severe aortic stenosis  6. Subclavian artery stenosis, right ([place]  7. Chronic obstructive pulmonary disease, unspecified COPD type ([place])  8. Other specified hypothyroidism  9. Hyperlipidemia, unspecified hyperlipidemia type  10. Morbid obesity ([place])  11. S/P TAVR (transcatheter aortic valve replacement)   Labs reviewed from Cardiologists  Health Maintenance reviewed Diet and exercise encouraged  Follow up plan: 3 months    Evelina Dun, FNP

## 2020-02-12 NOTE — Patient Instructions (Signed)
Dizziness Dizziness is a common problem. It is a feeling of unsteadiness or light-headedness. You may feel like you are about to faint. Dizziness can lead to injury if you stumble or fall. Anyone can become dizzy, but dizziness is more common in older adults. This condition can be caused by a number of things, including medicines, dehydration, or illness. Follow these instructions at home: Eating and drinking  Drink enough fluid to keep your urine clear or pale yellow. This helps to keep you from becoming dehydrated. Try to drink more clear fluids, such as water.  Do not drink alcohol.  Limit your caffeine intake if told to do so by your health care provider. Check ingredients and nutrition facts to see if a food or beverage contains caffeine.  Limit your salt (sodium) intake if told to do so by your health care provider. Check ingredients and nutrition facts to see if a food or beverage contains sodium. Activity  Avoid making quick movements. ? Rise slowly from chairs and steady yourself until you feel okay. ? In the morning, first sit up on the side of the bed. When you feel okay, stand slowly while you hold onto something until you know that your balance is fine.  If you need to stand in one place for a long time, move your legs often. Tighten and relax the muscles in your legs while you are standing.  Do not drive or use heavy machinery if you feel dizzy.  Avoid bending down if you feel dizzy. Place items in your home so that they are easy for you to reach without leaning over. Lifestyle  Do not use any products that contain nicotine or tobacco, such as cigarettes and e-cigarettes. If you need help quitting, ask your health care provider.  Try to reduce your stress level by using methods such as yoga or meditation. Talk with your health care provider if you need help to manage your stress. General instructions  Watch your dizziness for any changes.  Take over-the-counter and  prescription medicines only as told by your health care provider. Talk with your health care provider if you think that your dizziness is caused by a medicine that you are taking.  Tell a friend or a family member that you are feeling dizzy. If he or she notices any changes in your behavior, have this person call your health care provider.  Keep all follow-up visits as told by your health care provider. This is important. Contact a health care provider if:  Your dizziness does not go away.  Your dizziness or light-headedness gets worse.  You feel nauseous.  You have reduced hearing.  You have new symptoms.  You are unsteady on your feet or you feel like the room is spinning. Get help right away if:  You vomit or have diarrhea and are unable to eat or drink anything.  You have problems talking, walking, swallowing, or using your arms, hands, or legs.  You feel generally weak.  You are not thinking clearly or you have trouble forming sentences. It may take a friend or family member to notice this.  You have chest pain, abdominal pain, shortness of breath, or sweating.  Your vision changes.  You have any bleeding.  You have a severe headache.  You have neck pain or a stiff neck.  You have a fever. These symptoms may represent a serious problem that is an emergency. Do not wait to see if the symptoms will go away. Get medical help   right away. Call your local emergency services (911 in the [place] Do not drive yourself to the hospital. Summary  Dizziness is a feeling of unsteadiness or light-headedness. This condition can be caused by a number of things, including medicines, dehydration, or illness.  Anyone can become dizzy, but dizziness is more common in older adults.  Drink enough fluid to keep your urine clear or pale yellow. Do not drink alcohol.  Avoid making quick movements if you feel dizzy. Monitor your dizziness for any changes. This information is not intended to  replace advice given to you by your health care provider. Make sure you discuss any questions you have with your health care provider. Document Revised: 05/25/2017 Document Reviewed: 06/24/2016 [employer] Patient Education  2020 [employer]  

## 2020-02-17 ENCOUNTER — Ambulatory Visit: Payer: Medicare Other | Admitting: Family

## 2020-02-17 NOTE — Progress Notes (Signed)
[facility] [place]                                       Cardiology Office Note    Date:  02/19/2020   ID:  Matthew May, DOB 1945-09-10, MRN [id number]  PCP:  Sharion Balloon, FNP  Cardiologist:  Dr. Michelle Piper / Dr. Burt Knack & Dr. Cyndia Bent (TAVR)  CC: 1 month s/p TAVR  History of Present Illness:  Matthew May is a 74 y.o. 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   He has a history of coronary artery disease status post coronary artery bypass graft surgery x4 by Dr. Cyndia Bent in 2011 after a NSTEMI. He had a LIMA--> LAD, SVG--> OM, and sequential SVG-->PDA and PLA placed.  He has done well since his surgery but over the past 12 months has developed progressive exertional fatigue, shortness of breath, and chest discomfort. [facility] 12/30/19 showed occluded SVG-> LCx, patent sequential SVG -->PDA and PL, widely patent LIMA -->LAD, widely patent native left main. There was a patent LAD with competitive flow into the distal LAD from LIMA and severe diffuse disease in the mid to distal circumflex with functional total occlusion and collateralization of the second obtuse marginal from right to left collaterals. There was total occlusion mid RCA.   He was evaluated by the multidisciplinary valve team and underwent successful TAVR with a 29 mm Edwards Sapien 3 THV via the TF approach on 01/20/20. Post operative echo showed EF 60-65%, normally functioning TAVR with a mean gradient of 7 mmHg and no PVL. He was discharged home on Eliquis with the addition of a baby aspirin. At one week follow up ECG showed new wide LBBB ( qrs 180ms), 1st deg Av block (PR more prolonged) at 272ms). He was having some dizziness so a Zio  patch was placed. Bp was elevated and losartan was increased. Bmet was supposed to be drawn at PCP office, but never completed.   Today he presents to clinic for follow up. No CP or SOB. No LE edema, orthopnea or PND. Dizziness improved and no syncope. No blood in stool or urine. No palpitations. Has mucus congestion in chest.   Past Medical History:  Diagnosis Date  . Atrial fibrillation ([place])   . CAD (coronary artery disease)    a. s/p NSTEMI 4/11 => s/p CABG (L-LAD, S-OM2, S-PDA/PL);  b.  ETT-Myoview 6/14:  normal study, no ischemia, EF 67%  . Carotid stenosis    Carotid U/S 0/73:  RICA 7-10%, LICA 62-69%; right vertebral flow retrograde suggestive of steel-consider PV consult  . COPD (chronic obstructive pulmonary disease) ([place])   . Diabetes mellitus without complication ([place])   . HLD (hyperlipidemia)   . HTN (hypertension)   . Obesity   . Renal insufficiency   . Severe aortic stenosis   . Subclavian artery stenosis, right ([place])    based upon carotid U/S done 11/2012    Past Surgical History:  Procedure Laterality Date  . CORONARY ARTERY BYPASS GRAFT     2011, LIMA to LAD coronary artery, SVG to OM2 branch of lect circumflex coronary artery,  and a sequential SVG to psot descening to posterolateral branches to RCA  . endoscopic vein harvesting     right leg   . HERNIA REPAIR    . RIGHT/LEFT HEART CATH AND CORONARY/GRAFT ANGIOGRAPHY N/A 12/31/2017   Procedure: RIGHT/LEFT HEART CATH AND CORONARY/GRAFT ANGIOGRAPHY;  Surgeon: Leonie Man, MD;  Location: [facility];  Service: Cardiovascular;  Laterality: N/A;  . RIGHT/LEFT HEART CATH AND CORONARY/GRAFT ANGIOGRAPHY N/A 12/30/2019   Procedure: RIGHT/LEFT HEART CATH AND CORONARY/GRAFT ANGIOGRAPHY;  Surgeon: Belva Crome, MD;  Location: [facility];  Service: Cardiovascular;  Laterality: N/A;  . TEE WITHOUT CARDIOVERSION N/A 01/20/2020   Procedure: TRANSESOPHAGEAL ECHOCARDIOGRAM (TEE);  Surgeon: [name], MD;   Location: [facility];  Service: Open Heart Surgery;  Laterality: N/A;  . TONSILLECTOMY    . TRANSCATHETER AORTIC VALVE REPLACEMENT, TRANSFEMORAL N/A 01/20/2020   Procedure: TRANSCATHETER AORTIC VALVE REPLACEMENT, TRANSFEMORALUSING EDWARDS SAPIEN 3 29 MM AORTIC THV.;  Surgeon: [name], MD;  Location: [facility];  Service: Open Heart Surgery;  Laterality: N/A;    Current Medications: Outpatient Medications Prior to Visit  Medication Sig Dispense Refill  . acetaminophen (TYLENOL) 325 MG tablet Take 2 tablets (650 mg total) by mouth every 6 (six) hours as needed for mild pain (or Fever >/= 101).    Marland Kitchen albuterol (PROVENTIL HFA;VENTOLIN HFA) 108 (90 Base) MCG/ACT inhaler Inhale 2 puffs into the lungs every 6 (six) hours as needed for wheezing or shortness of breath. 1 Inhaler 0  . aspirin 81 MG chewable tablet Chew 1 tablet (81 mg total) by mouth daily.    Marland Kitchen atorvastatin (LIPITOR) 40 MG tablet TAKE 1 TABLET BY MOUTH  DAILY 90 tablet 3  . cholecalciferol (VITAMIN D3) 25 MCG (1000 UT) tablet Take 1,000 Units by mouth daily.    Marland Kitchen ELIQUIS 5 MG TABS tablet Take 1 tablet by mouth twice daily 60 tablet 0  . fluticasone furoate-vilanterol (BREO ELLIPTA) 100-25 MCG/INH AEPB Inhale 1 puff by mouth once daily 60 each 0  . glimepiride (AMARYL) 2 MG tablet Take 1 tablet by mouth once daily with breakfast 90 tablet 0  . glucose blood (ONETOUCH VERIO) test strip Test BS daily Dx E11.9 100 strip 3  . Lancets (ONETOUCH DELICA PLUS [id number]) MISC USE DAILY 100 each 3  . levothyroxine (SYNTHROID) 150 MCG tablet TAKE 1 TABLET BY MOUTH  DAILY 90 tablet 3  . metFORMIN (GLUCOPHAGE) 1000 MG tablet Take 1 tablet (1,000 mg total) by mouth 2 (two) times daily. 60 tablet 0  . Misc Natural Products (TART CHERRY ADVANCED PO) Take 1 tablet by mouth daily.     . nitroGLYCERIN (NITROSTAT) 0.4 MG SL tablet DISSOLVE ONE TABLET UNDER THE TONGUE EVERY 5 MINUTES AS NEEDED FOR CHEST PAIN.  DO NOT EXCEED A TOTAL OF 3 DOSES IN 15 MINUTES 25  tablet 0  . potassium chloride SA (KLOR-CON) 20 MEQ tablet TAKE 1 TABLET BY MOUTH  DAILY 90 tablet 0  . torsemide (DEMADEX) 20 MG tablet Take 2 tablets (40 mg total) by mouth daily. 180 tablet 3  . valsartan (DIOVAN) 40 MG tablet Take 1 tablet (40 mg total) by mouth daily. 90 tablet 3   No facility-administered medications prior to visit.     Allergies:   Prednisone, Januvia [sitagliptin], Jardiance [empagliflozin], and Lisinopril   Social History   Socioeconomic History  . Marital status: Married    Spouse name: Lucita Ferrara  . Number of children: 3  . Years of education: 8  .  Highest education level: 8th grade  Occupational History  . Occupation: Retired    Comment: scrap yard  Tobacco Use  . Smoking status: Former Smoker    Quit date: 07/11/2009    Years since quitting: 10.6  . Smokeless tobacco: Never Used  . Tobacco comment: smoked about 10 cig/day; used to smoke 2 ppd for many years   Vaping Use  . Vaping Use: Never used  Substance and Sexual Activity  . Alcohol use: No    Alcohol/week: 0.0 standard drinks  . Drug use: No  . Sexual activity: Never  Other Topics Concern  . Not on file  Social History Narrative   Lives at home with his wife. He is retired but his wife continues to work daily. They have adult children that live locally and grandchildren that they spend time with regularly. He is active around his home and yard.    Social Determinants of Health   Financial Resource Strain:   . Difficulty of Paying Living Expenses: Not on file  Food Insecurity:   . Worried About Charity fundraiser in the Last Year: Not on file  . Ran Out of Food in the Last Year: Not on file  Transportation Needs:   . Lack of Transportation (Medical): Not on file  . Lack of Transportation (Non-Medical): Not on file  Physical Activity:   . Days of Exercise per Week: Not on file  . Minutes of Exercise per Session: Not on file  Stress:   . Feeling of Stress : Not on file  Social  Connections:   . Frequency of Communication with Friends and Family: Not on file  . Frequency of Social Gatherings with Friends and Family: Not on file  . Attends Religious Services: Not on file  . Active Member of Clubs or Organizations: Not on file  . Attends Archivist Meetings: Not on file  . Marital Status: Not on file     Family History:  The patient's family history includes Depression in his maternal aunt; Diabetes in his brother; Heart attack in his father; Heart disease in his father; Mental illness in his mother and sister.     ROS:   Please see the history of present illness.    ROS All other systems reviewed and are negative.   PHYSICAL EXAM:   VS:  BP 98/64 (BP Location: Right Arm)   Pulse 72   Ht 5' 7.5" (1.715 m)   Wt 249 lb 3.2 oz (113 kg)   SpO2 97%   BMI 38.45 kg/m    GEN: Well nourished, well developed, in no acute distress, obese HEENT: normal Neck: no JVD or masses Cardiac: RRR; no murmurs, rubs, or gallops,no edema  Respiratory:  clear to auscultation bilaterally, normal work of breathing GI: soft, nontender, nondistended, + BS MS: no deformity or atrophy Skin: warm and dry, no rash.   Neuro:  Alert and Oriented x 3, Strength and sensation are intact Psych: euthymic mood, full affect   Wt Readings from Last 3 Encounters:  02/18/20 249 lb 3.2 oz (113 kg)  02/12/20 249 lb 6.4 oz (113.1 kg)  01/28/20 246 lb 3.2 oz (111.7 kg)      Studies/Labs Reviewed:   EKG:  EKG is NOT ordered today.   Recent Labs: 10/17/2019: TSH 1.920 01/16/2020: ALT 49; B Natriuretic Peptide 90.6 01/21/2020: Hemoglobin 12.4; Magnesium 1.9; Platelets 129 02/18/2020: BUN 20; Creatinine, Ser 0.98; Potassium 4.0; Sodium 139   Lipid Panel    Component  Value Date/Time   CHOL 110 10/17/2019 0855   CHOL 138 12/26/2012 1310   TRIG 138 10/17/2019 0855   TRIG 90 05/23/2013 1452   TRIG 108 12/26/2012 1310   HDL 31 (L) 10/17/2019 0855   HDL 34 (L) 05/23/2013 1452   HDL  33 (L) 12/26/2012 1310   CHOLHDL 3.5 10/17/2019 0855   CHOLHDL 5.9 10/01/2009 0700   VLDL 16 10/01/2009 0700   LDLCALC 55 10/17/2019 0855   LDLCALC 81 05/23/2013 1452   LDLCALC 83 12/26/2012 1310    Additional studies/ records that were reviewed today include:  TAVR OPERATIVE NOTE   Date of Procedure:                01/20/2020  Preoperative Diagnosis:      Severe Aortic Stenosis   Postoperative Diagnosis:    Same   Procedure:        Transcatheter Aortic Valve Replacement - Percutaneous Left Transfemoral Approach             Edwards Sapien 3 THV (size 29 mm, model # 9600TFX, serial # [id number])              Co-Surgeons:                        [name], MD and [name], MD   Anesthesiologist:                  Wilfrid Lund, MD  Echocardiographer:              Edmonia James, MD  Pre-operative Echo Findings: ? Severe aortic stenosis ? Normal left ventricular systolic function  Post-operative Echo Findings: ? No paravalvular leak ? Normal left ventricular systolic function  _____________________  Echo 01/21/20:  IMPRESSIONS 1. Left ventricular ejection fraction, by estimation, is 60 to 65%. The left ventricle has normal function. The left ventricle has no regional wall motion abnormalities. There is severe left ventricular hypertrophy.  Left ventricular diastolic parameters  are consistent with Grade II diastolic dysfunction (pseudonormalization).  Elevated left ventricular end-diastolic pressure.  2. Right ventricular systolic function is normal. The right ventricular  size is normal.  3. Left atrial size was severely dilated.  4. The mitral valve is normal in structure. Mild mitral valve  regurgitation. No evidence of mitral stenosis.  5. Post op day one TAVR with 29 mm Sapien 3 valve no significant PvL Peak  velocity 1.9 m/sec mean gradient 7 peak 14.5 mmHg AVA 1.6 cm2. The aortic  valve has been repaired/replaced. Aortic valve regurgitation is not   visualized. No aortic stenosis is  present. There is a 29 mm Edwards Sapien prosthetic (TAVR) valve present  in the aortic position. Procedure Date: 01/20/2020.  6. The inferior vena cava is normal in size with greater than 50%  respiratory variability, suggesting right atrial pressure of 3 mmHg.   ____________________  Echo 02/18/20 IMPRESSIONS    1. Left ventricular ejection fraction, by estimation, is 55 to 60%. The left ventricle has normal function. The left ventricle has no regional wall motion abnormalities. There is moderate left ventricular hypertrophy. Left ventricular diastolic  parameters are consistent with Grade I diastolic dysfunction (impaired relaxation). Elevated left ventricular end-diastolic pressure. The E/e' is 32.  2. Right ventricular systolic function is normal. The right ventricular size is normal. There is normal pulmonary artery systolic pressure.  3. Left atrial size was mildly dilated.  4. The pericardial effusion is posterior to  the left ventricle.  5. The mitral valve is abnormal. Trivial mitral valve regurgitation.  6. The aortic valve has been repaired/replaced. Aortic valve regurgitation is not visualized. There is a 29 mm Sapien prosthetic (TAVR) valve present in the aortic position. Echo findings are consistent with normal structure and function of the aortic  valve prosthesis. Aortic valve area, by VTI measures 1.71 cm. Aortic valve mean gradient measures 8.0 mmHg. Aortic valve Vmax measures 1.94 m/s.  7. The inferior vena cava is normal in size with greater than 50% respiratory variability, suggesting right atrial pressure of 3 mmHg.  Comparison(s): Changes from prior study are noted. 01/21/2020: LVEF 60-65%, severe LVH, grade 2 DD, severe LAE, Sapien 3 THV -mean gradient 7 mmHg.    ASSESSMENT & PLAN:   Severe AS s/p TAVR: echo today shows EF 60%, normally functioning TAVR with a mean gradient of 7 mm hg and no PVL.  Doing great with NYHA class  II symptoms. SBE prophylaxis discussed; the patient is edentulous and does not go to the dentist. Continue on Eliquis and aspirin. Stop Aspirin 81 mg daily after 6 months of therapy on 07/23/19. I will see him back in 1 year with echo.   HTN: BP better controlled on increased Losartan. Will check BMET today.   Dizziness: pt having some dizziness with head movements. Sounds vestibular in nature. Recent Zio patch pending final report but no high risk alerts. This has actually improved recently.   Medication Adjustments/Labs and Tests Ordered: Current medicines are reviewed at length with the patient today.  Concerns regarding medicines are outlined above.  Medication changes, Labs and Tests ordered today are listed in the Patient Instructions below. Patient Instructions  Medication Instructions:  1) STOP ASPIRIN 07/22/2020 *If you need a refill on your cardiac medications before your next appointment, please call your pharmacy*  Lab Work: TODAY! BMET If you have labs (blood work) drawn today and your tests are completely normal, you will receive your results only by: Marland Kitchen MyChart Message (if you have MyChart) OR . A paper copy in the mail If you have any lab test that is abnormal or we need to change your treatment, we will call you to review the results.  Follow-Up: You are scheduled to see Dr. Percival Spanish in 6 months.  We will call you to arrange your 1 year TAVR echo and office visit!    Signed, Angelena Form, PA-C  02/19/2020 6:50 AM    [facility] [address], [place], [place]  [address] Phone: [phone]; Fax: [phone]

## 2020-02-18 ENCOUNTER — Ambulatory Visit: Payer: Medicare Other | Admitting: Physician Assistant

## 2020-02-18 ENCOUNTER — Encounter: Payer: Self-pay | Admitting: Physician Assistant

## 2020-02-18 ENCOUNTER — Ambulatory Visit (HOSPITAL_COMMUNITY): Payer: Medicare Other | Attending: Internal Medicine

## 2020-02-18 ENCOUNTER — Other Ambulatory Visit: Payer: Self-pay

## 2020-02-18 VITALS — BP 98/64 | HR 72 | Ht 67.5 in | Wt 249.2 lb

## 2020-02-18 DIAGNOSIS — Z952 Presence of prosthetic heart valve: Secondary | ICD-10-CM | POA: Diagnosis not present

## 2020-02-18 DIAGNOSIS — R42 Dizziness and giddiness: Secondary | ICD-10-CM

## 2020-02-18 DIAGNOSIS — I10 Essential (primary) hypertension: Secondary | ICD-10-CM

## 2020-02-18 LAB — ECHOCARDIOGRAM COMPLETE
AR max vel: 1.48 cm2
AV Area VTI: 1.71 cm2
AV Area mean vel: 1.61 cm2
AV Mean grad: 8 mmHg
AV Peak grad: 15.1 mmHg
Ao pk vel: 1.94 m/s
Area-P 1/2: 2.7 cm2
S' Lateral: 3.6 cm

## 2020-02-18 NOTE — Patient Instructions (Signed)
Medication Instructions:  1) STOP ASPIRIN 07/22/2020 *If you need a refill on your cardiac medications before your next appointment, please call your pharmacy*  Lab Work: TODAY! BMET If you have labs (blood work) drawn today and your tests are completely normal, you will receive your results only by:  [place] (if you have MyChart) OR  A paper copy in the mail If you have any lab test that is abnormal or we need to change your treatment, we will call you to review the results.  Follow-Up: You are scheduled to see Dr. Percival Spanish in 6 months.  We will call you to arrange your 1 year TAVR echo and office visit!

## 2020-02-19 LAB — BASIC METABOLIC PANEL
BUN/Creatinine Ratio: 20 (ref 10–24)
BUN: 20 mg/dL (ref 8–27)
CO2: 28 mmol/L (ref 20–29)
Calcium: 9.6 mg/dL (ref 8.6–10.2)
Chloride: 96 mmol/L (ref 96–106)
Creatinine, Ser: 0.98 mg/dL (ref 0.76–1.27)
GFR calc Af Amer: 89 mL/min/{1.73_m2} (ref >59)
GFR calc non Af Amer: 77 mL/min/{1.73_m2} (ref >59)
Glucose: 116 mg/dL — ABNORMAL HIGH (ref 65–99)
Potassium: 4 mmol/L (ref 3.5–5.2)
Sodium: 139 mmol/L (ref 134–144)

## 2020-02-20 ENCOUNTER — Ambulatory Visit (INDEPENDENT_AMBULATORY_CARE_PROVIDER_SITE_OTHER): Payer: Medicare Other | Admitting: Pharmacist

## 2020-02-20 ENCOUNTER — Other Ambulatory Visit: Payer: Self-pay

## 2020-02-20 DIAGNOSIS — E119 Type 2 diabetes mellitus without complications: Secondary | ICD-10-CM | POA: Diagnosis not present

## 2020-02-20 NOTE — Progress Notes (Signed)
    02/20/2020 Name: Matthew May MRN: [id number] DOB: 1946-01-19   S:  33 yoM 3Presents for diabetes evaluation, education, and management Patient was referred and last seen by Primary Care Provider on 02/12/20.  Insurance coverage/medication affordability: UHC medicare  Patient reports adherence with medications. . Current diabetes medications include: metformin, glimepiride . Current hypertension medications include: valsartan Goal 130/80 . Current hyperlipidemia medications include: atorvastatin   Patient denies hypoglycemic events.   Patient reported dietary habits: Eats 2-3 meals/day Discussed meal planning options and Plate method for healthy eating . Avoid sugary drinks and desserts . Incorporate balanced protein, non starchy veggies, 1 serving of carbohydrate with each meal . Increase water intake . Increase physical activity as able  The patient is asked to make an attempt to improve diet and exercise patterns to aid in medical management of this problem.  Patient-reported exercise habits: works it the yard  Patient denies visual changes.  Has always had visual problems    O:  Lab Results  Component Value Date   HGBA1C 7.1 (H) 02/12/2020   Lipid Panel     Component Value Date/Time   CHOL 110 10/17/2019 0855   CHOL 138 12/26/2012 1310   TRIG 138 10/17/2019 0855   TRIG 90 05/23/2013 1452   TRIG 108 12/26/2012 1310   HDL 31 (L) 10/17/2019 0855   HDL 34 (L) 05/23/2013 1452   HDL 33 (L) 12/26/2012 1310   CHOLHDL 3.5 10/17/2019 0855   CHOLHDL 5.9 10/01/2009 0700   VLDL 16 10/01/2009 0700   LDLCALC 55 10/17/2019 0855   LDLCALC 81 05/23/2013 1452   LDLCALC 83 12/26/2012 1310     Home fasting blood sugars: 125, 200  2 hour post-meal/random blood sugars: n/a.    A/P:  Diabetes T2DM, A1c 7.1%  Patient is adherent with medication.   -Continue metformin  -Continue glimepiride  -START farxiga 10mg  daily (GFR 77)  Patient to start once medication  received [place] ([place] and [place]  -Will also apply for Symbicort via [place] and [place] patient assistance   -Extensively discussed pathophysiology of diabetes, recommended lifestyle interventions, dietary effects on blood sugar control  -Counseled on s/sx of and management of hypoglycemia  -Next A1C anticipated 05/11/20  Written patient instructions provided.  Total time in face to face counseling 29 minutes.   Follow up PCP Clinic Visit ON 05/11/20.   Regina Eck, PharmD, BCPS Clinical Pharmacist, [place]  II Phone [phone]

## 2020-02-24 ENCOUNTER — Other Ambulatory Visit: Payer: Self-pay | Admitting: Family

## 2020-02-26 ENCOUNTER — Other Ambulatory Visit: Payer: Self-pay | Admitting: Family

## 2020-03-27 ENCOUNTER — Other Ambulatory Visit: Payer: Self-pay | Admitting: Family

## 2020-03-27 DIAGNOSIS — R609 Edema, unspecified: Secondary | ICD-10-CM

## 2020-03-27 DIAGNOSIS — E1159 Type 2 diabetes mellitus with other circulatory complications: Secondary | ICD-10-CM

## 2020-04-17 ENCOUNTER — Other Ambulatory Visit: Payer: Self-pay | Admitting: Family

## 2020-04-28 ENCOUNTER — Other Ambulatory Visit: Payer: Self-pay | Admitting: Family

## 2020-04-28 ENCOUNTER — Other Ambulatory Visit: Payer: Self-pay | Admitting: Cardiology

## 2020-05-04 ENCOUNTER — Other Ambulatory Visit: Payer: Self-pay | Admitting: Family

## 2020-05-04 DIAGNOSIS — M109 Gout, unspecified: Secondary | ICD-10-CM

## 2020-05-04 DIAGNOSIS — M10071 Idiopathic gout, right ankle and foot: Secondary | ICD-10-CM

## 2020-05-11 ENCOUNTER — Encounter: Payer: Self-pay | Admitting: Family

## 2020-05-11 ENCOUNTER — Other Ambulatory Visit: Payer: Self-pay

## 2020-05-11 ENCOUNTER — Ambulatory Visit (INDEPENDENT_AMBULATORY_CARE_PROVIDER_SITE_OTHER): Payer: Medicare Other | Admitting: Family

## 2020-05-11 VITALS — BP 138/69 | HR 87 | Temp 97.4°F | Ht 67.0 in | Wt 244.2 lb

## 2020-05-11 DIAGNOSIS — I10 Essential (primary) hypertension: Secondary | ICD-10-CM | POA: Diagnosis not present

## 2020-05-11 DIAGNOSIS — E785 Hyperlipidemia, unspecified: Secondary | ICD-10-CM

## 2020-05-11 DIAGNOSIS — I2510 Atherosclerotic heart disease of native coronary artery without angina pectoris: Secondary | ICD-10-CM

## 2020-05-11 DIAGNOSIS — E119 Type 2 diabetes mellitus without complications: Secondary | ICD-10-CM

## 2020-05-11 DIAGNOSIS — I480 Paroxysmal atrial fibrillation: Secondary | ICD-10-CM

## 2020-05-11 DIAGNOSIS — I6523 Occlusion and stenosis of bilateral carotid arteries: Secondary | ICD-10-CM | POA: Diagnosis not present

## 2020-05-11 DIAGNOSIS — J449 Chronic obstructive pulmonary disease, unspecified: Secondary | ICD-10-CM

## 2020-05-11 DIAGNOSIS — E038 Other specified hypothyroidism: Secondary | ICD-10-CM

## 2020-05-11 LAB — BAYER DCA HB A1C WAIVED: HB A1C (BAYER DCA - WAIVED): 7.4 % — ABNORMAL HIGH (ref <7.0)

## 2020-05-11 NOTE — Progress Notes (Signed)
Subjective:    Patient ID: Matthew May, male    DOB: [birth year], 74 y.o.   MRN: [id number]  Chief Complaint  Patient presents with  . Diabetes  . Hypertension   Ptcallsthe office todaychronic follow up. Pt is followed by Cardiologists CAD, aortic stenosis,and A Fib. 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 Doing well at this time. Diabetes He presents for his follow-up diabetic visit. He has type 2 diabetes mellitus. His disease course has been stable. Hypoglycemia symptoms include nervousness/anxiousness. Associated symptoms include fatigue. Pertinent negatives for diabetes include no blurred vision and no foot paresthesias. There are no hypoglycemic complications. Diabetic complications include heart disease, nephropathy and peripheral neuropathy. Risk factors for coronary artery disease include dyslipidemia, diabetes mellitus, male sex, hypertension and sedentary lifestyle. He is following a generally unhealthy diet. His overall blood glucose range is 140-180 mg/dl. An ACE inhibitor/angiotensin II receptor blocker is being taken.  Hypertension This is a chronic problem. The current episode started more than 1 year ago. The problem has been waxing and waning since onset. The problem is controlled. Associated symptoms include malaise/fatigue and shortness of breath. Pertinent negatives include no blurred vision or peripheral edema. Risk factors for coronary artery disease include dyslipidemia, diabetes mellitus, obesity and male gender. The current treatment provides moderate improvement. Hypertensive end-organ damage includes CAD/MI and heart failure. Identifiable causes of hypertension include a thyroid problem.  Thyroid Problem Presents for follow-up visit. Symptoms include anxiety and fatigue. The symptoms have been stable. His past medical history is significant for heart failure and hyperlipidemia.  Hyperlipidemia This is a chronic problem. The current episode  started more than 1 year ago. The problem is controlled. Recent lipid tests were reviewed and are normal. Exacerbating diseases include obesity. Associated symptoms include shortness of breath. Current antihyperlipidemic treatment includes statins. The current treatment provides moderate improvement of lipids. Risk factors for coronary artery disease include dyslipidemia, diabetes mellitus, male sex, hypertension and a sedentary lifestyle.  COPD States his breathing is stable. Uses albuterol 2-3 times a day. States his [employer] is too expensive and could not afford it.     Review of Systems  Constitutional: Positive for fatigue and malaise/fatigue.  Eyes: Negative for blurred vision.  Respiratory: Positive for shortness of breath.   Psychiatric/Behavioral: The patient is nervous/anxious.   All other systems reviewed and are negative.      Objective:   Physical Exam Vitals reviewed.  Constitutional:      General: He is not in acute distress.    Appearance: He is well-developed. He is obese.  HENT:     Head: Normocephalic.     Right Ear: Tympanic membrane normal.     Left Ear: Tympanic membrane normal.  Eyes:     General:        Right eye: No discharge.        Left eye: No discharge.     Pupils: Pupils are equal, round, and reactive to light.  Neck:     Thyroid: No thyromegaly.  Cardiovascular:     Rate and Rhythm: Normal rate and regular rhythm.     Heart sounds: Normal heart sounds. No murmur heard.   Pulmonary:     Effort: Pulmonary effort is normal. No respiratory distress.     Breath sounds: Normal breath sounds. No wheezing.  Abdominal:     General: Bowel sounds are normal. There is no distension.     Palpations: Abdomen is soft.     Tenderness: There is  no abdominal tenderness.  Musculoskeletal:        General: No tenderness. Normal range of motion.     Cervical back: Normal range of motion and neck supple.  Skin:    General: Skin is warm and dry.     Findings: No  erythema or rash.  Neurological:     Mental Status: He is alert and oriented to person, place, and time.     Cranial Nerves: No cranial nerve deficit.     Deep Tendon Reflexes: Reflexes are normal and symmetric.  Psychiatric:        Behavior: Behavior normal.        Thought Content: Thought content normal.        Judgment: Judgment normal.       BP 138/69   Pulse 87   Temp (!) 97.4 F (36.3 C) (Temporal)   Ht '5\' 7"'  (1.702 m)   Wt 244 lb 3.2 oz (110.8 kg)   SpO2 97%   BMI 38.25 kg/m      Assessment & Plan:  Matthew May comes in today with chief complaint of Diabetes, Hypertension, and COPD   Diagnosis and orders addressed:  1. Type 2 diabetes mellitus without complication, without long-term current use of insulin (HCC) - Bayer DCA Hb A1c Waived - CMP14+EGFR - CBC with Differential/Platelet - Microalbumin / creatinine urine ratio  2. Atherosclerosis of native coronary artery of native heart without angina pectoris - CMP14+EGFR - CBC with Differential/Platelet  3. Bilateral carotid artery stenosis - CMP14+EGFR - CBC with Differential/Platelet  4. Primary hypertension - CMP14+EGFR - CBC with Differential/Platelet  5. Paroxysmal atrial fibrillation (HCC) - CMP14+EGFR - CBC with Differential/Platelet  6. Chronic obstructive pulmonary disease, unspecified COPD type ([place]) - CMP14+EGFR - CBC with Differential/Platelet  7. Other specified hypothyroidism - CMP14+EGFR - CBC with Differential/Platelet - TSH  8. Hyperlipidemia, unspecified hyperlipidemia type - CMP14+EGFR - CBC with Differential/Platelet   Labs pending Health Maintenance reviewed Diet and exercise encouraged  Follow up plan: 3 months   Evelina Dun, FNP

## 2020-05-11 NOTE — Patient Instructions (Signed)

## 2020-05-12 LAB — CBC WITH DIFFERENTIAL/PLATELET
Basophils Absolute: 0.1 10*3/uL (ref 0.0–0.2)
Basos: 1 %
EOS (ABSOLUTE): 0.4 10*3/uL (ref 0.0–0.4)
Eos: 5 %
Hematocrit: 40.6 % (ref 37.5–51.0)
Hemoglobin: 13.7 g/dL (ref 13.0–17.7)
Immature Grans (Abs): 0 10*3/uL (ref 0.0–0.1)
Immature Granulocytes: 1 %
Lymphocytes Absolute: 1.7 10*3/uL (ref 0.7–3.1)
Lymphs: 20 %
MCH: 29.2 pg (ref 26.6–33.0)
MCHC: 33.7 g/dL (ref 31.5–35.7)
MCV: 87 fL (ref 79–97)
Monocytes Absolute: 0.5 10*3/uL (ref 0.1–0.9)
Monocytes: 5 %
Neutrophils Absolute: 5.9 10*3/uL (ref 1.4–7.0)
Neutrophils: 68 %
Platelets: 142 10*3/uL — ABNORMAL LOW (ref 150–450)
RBC: 4.69 x10E6/uL (ref 4.14–5.80)
RDW: 13.1 % (ref 11.6–15.4)
WBC: 8.5 10*3/uL (ref 3.4–10.8)

## 2020-05-12 LAB — CMP14+EGFR
ALT: 45 IU/L — ABNORMAL HIGH (ref 0–44)
AST: 34 IU/L (ref 0–40)
Albumin/Globulin Ratio: 1.6 (ref 1.2–2.2)
Albumin: 4.5 g/dL (ref 3.7–4.7)
Alkaline Phosphatase: 83 IU/L (ref 44–121)
BUN/Creatinine Ratio: 15 (ref 10–24)
BUN: 16 mg/dL (ref 8–27)
Bilirubin Total: 0.4 mg/dL (ref 0.0–1.2)
CO2: 24 mmol/L (ref 20–29)
Calcium: 10.2 mg/dL (ref 8.6–10.2)
Chloride: 97 mmol/L (ref 96–106)
Creatinine, Ser: 1.08 mg/dL (ref 0.76–1.27)
GFR calc Af Amer: 79 mL/min/{1.73_m2} (ref >59)
GFR calc non Af Amer: 68 mL/min/{1.73_m2} (ref >59)
Globulin, Total: 2.8 g/dL (ref 1.5–4.5)
Glucose: 126 mg/dL — ABNORMAL HIGH (ref 65–99)
Potassium: 4.5 mmol/L (ref 3.5–5.2)
Sodium: 138 mmol/L (ref 134–144)
Total Protein: 7.3 g/dL (ref 6.0–8.5)

## 2020-05-12 LAB — MICROALBUMIN / CREATININE URINE RATIO
Creatinine, Urine: 96 mg/dL
Microalb/Creat Ratio: 36 mg/g creat — ABNORMAL HIGH (ref 0–29)
Microalbumin, Urine: 34.8 ug/mL

## 2020-05-12 LAB — TSH: TSH: 2.17 u[IU]/mL (ref 0.450–4.500)

## 2020-05-13 ENCOUNTER — Other Ambulatory Visit: Payer: Self-pay

## 2020-05-13 ENCOUNTER — Ambulatory Visit (INDEPENDENT_AMBULATORY_CARE_PROVIDER_SITE_OTHER): Payer: Medicare Other | Admitting: Pharmacist

## 2020-05-13 DIAGNOSIS — E119 Type 2 diabetes mellitus without complications: Secondary | ICD-10-CM | POA: Diagnosis not present

## 2020-05-13 NOTE — Progress Notes (Signed)
° ° °  05/13/2020 Name: Matthew May MRN: [id number] DOB: 1946-04-09   S:  75 yoF Presents for diabetes evaluation, education, and management. Patient was referred and last seen by Primary Care Provider on 05/11/20.  Insurance coverage/medication affordability: [employer] medicare  Patient reports adherence with medications. Patient uses pill box to fill medication.  He is not sure if he is taking [place] or not.  He will check at home  Current diabetes medications include: metformin, glimepiride, farxiga  Current hypertension medications include: valsartan Goal 130/80  Current hyperlipidemia medications include: atorvastatin   Patient denies hypoglycemic events.   O:  Lab Results  Component Value Date   HGBA1C 7.4 (H) 05/11/2020   Lipid Panel     Component Value Date/Time   CHOL 110 10/17/2019 0855   CHOL 138 12/26/2012 1310   TRIG 138 10/17/2019 0855   TRIG 90 05/23/2013 1452   TRIG 108 12/26/2012 1310   HDL 31 (L) 10/17/2019 0855   HDL 34 (L) 05/23/2013 1452   HDL 33 (L) 12/26/2012 1310   CHOLHDL 3.5 10/17/2019 0855   CHOLHDL 5.9 10/01/2009 0700   VLDL 16 10/01/2009 0700   LDLCALC 55 10/17/2019 0855   LDLCALC 81 05/23/2013 1452   LDLCALC 83 12/26/2012 1310     Home fasting blood sugars: <150  2 hour post-meal/random blood sugars: n/a.      A/P:  Diabetes T2DM, A1c 7.4% has increased since last visit.  Patient doesn't recall being on Farxiga.  Will restart Wilder Glade and enroll patient in patient assistance.  Patient is adherent with medication otherwise.   -Continue all other medications as prescribed  -Enrolled patient in [place] [facility] patient assistance program for Farxiga and Symbicort.  These therapies are cost prohibitive for this patient.  Applications prepared and routed to PCP to sign.  Medication will ship to patient's home address.    -Extensively discussed pathophysiology of diabetes, recommended lifestyle interventions, dietary effects on blood sugar  control  -Counseled on s/sx of and management of hypoglycemia  -Next A1C anticipated 3 months.    Written patient instructions provided.  Total time in face to face counseling 25 minutes.   Follow up PCP Clinic Visit ON 08/10/20.    Regina Eck, PharmD, BCPS Clinical Pharmacist, [place]  II Phone [phone]

## 2020-05-14 ENCOUNTER — Other Ambulatory Visit: Payer: Self-pay | Admitting: Family

## 2020-06-02 ENCOUNTER — Other Ambulatory Visit: Payer: Self-pay | Admitting: Family

## 2020-06-17 ENCOUNTER — Other Ambulatory Visit: Payer: Self-pay | Admitting: Family

## 2020-06-17 DIAGNOSIS — E1159 Type 2 diabetes mellitus with other circulatory complications: Secondary | ICD-10-CM

## 2020-06-17 DIAGNOSIS — R609 Edema, unspecified: Secondary | ICD-10-CM

## 2020-06-17 DIAGNOSIS — I152 Hypertension secondary to endocrine disorders: Secondary | ICD-10-CM

## 2020-06-21 ENCOUNTER — Other Ambulatory Visit: Payer: Self-pay | Admitting: Family

## 2020-07-02 ENCOUNTER — Other Ambulatory Visit: Payer: Self-pay | Admitting: Family

## 2020-07-02 ENCOUNTER — Telehealth: Payer: Self-pay

## 2020-07-02 NOTE — Telephone Encounter (Signed)
  Prescription Request  07/02/2020  What is the name of the medication or equipment? ELIQUIS 5 MG TABS tablet  Have you contacted your pharmacy to request a refill? (if applicable) yes  Which pharmacy would you like this sent to? [facility]   Patient notified that their request is being sent to the clinical staff for review and that they should receive a response within 2 business days.

## 2020-07-02 NOTE — Telephone Encounter (Signed)
Done in Rx In basket. Pt aware

## 2020-07-19 ENCOUNTER — Other Ambulatory Visit: Payer: Self-pay | Admitting: Family

## 2020-07-19 DIAGNOSIS — J42 Unspecified chronic bronchitis: Secondary | ICD-10-CM

## 2020-07-23 ENCOUNTER — Other Ambulatory Visit: Payer: Self-pay

## 2020-07-23 ENCOUNTER — Encounter: Payer: Self-pay | Admitting: Family

## 2020-07-23 ENCOUNTER — Ambulatory Visit (INDEPENDENT_AMBULATORY_CARE_PROVIDER_SITE_OTHER): Payer: Medicare Other | Admitting: Family

## 2020-07-23 ENCOUNTER — Other Ambulatory Visit: Payer: Self-pay | Admitting: Family

## 2020-07-23 VITALS — BP 135/70 | HR 92 | Temp 97.7°F | Ht 67.0 in | Wt 247.0 lb

## 2020-07-23 DIAGNOSIS — Z23 Encounter for immunization: Secondary | ICD-10-CM | POA: Diagnosis not present

## 2020-07-23 DIAGNOSIS — S81812A Laceration without foreign body, left lower leg, initial encounter: Secondary | ICD-10-CM | POA: Diagnosis not present

## 2020-07-23 DIAGNOSIS — L089 Local infection of the skin and subcutaneous tissue, unspecified: Secondary | ICD-10-CM | POA: Diagnosis not present

## 2020-07-23 DIAGNOSIS — E1165 Type 2 diabetes mellitus with hyperglycemia: Secondary | ICD-10-CM | POA: Diagnosis not present

## 2020-07-23 MED ORDER — CEPHALEXIN 500 MG PO CAPS: 500.0000 mg | ORAL_CAPSULE | Freq: Three times a day (TID) | ORAL | 0 refills | Status: DC

## 2020-07-23 NOTE — Progress Notes (Signed)
Subjective:    Patient ID: Matthew May, male    DOB: 04-Apr-1946, 75 y.o.   MRN: [id number]  Chief Complaint  Patient presents with  . Leg Injury    Hit leg around gas pump 3 days ago skin tear    HPI PT presents to the office today with left lower leg skin tear that occurred 3 days. 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 He is a DM and his last A1C was 7.4.   He states his pain is a 7 out 10. He reports the area is erythemas and draining a yellow discharge.   TDAP out of date.   Review of Systems  All other systems reviewed and are negative.      Objective:   Physical Exam Vitals reviewed.  Constitutional:      General: He is not in acute distress.    Appearance: He is well-developed and well-nourished. He is obese.  HENT:     Head: Normocephalic.     Mouth/Throat:     Mouth: Oropharynx is clear and moist.  Eyes:     General:        Right eye: No discharge.        Left eye: No discharge.     Pupils: Pupils are equal, round, and reactive to light.  Neck:     Thyroid: No thyromegaly.  Cardiovascular:     Rate and Rhythm: Normal rate and regular rhythm.     Pulses: Intact distal pulses.     Heart sounds: Normal heart sounds. No murmur heard.   Pulmonary:     Effort: Pulmonary effort is normal. No respiratory distress.     Breath sounds: Normal breath sounds. No wheezing.  Abdominal:     General: Bowel sounds are normal. There is no distension.     Palpations: Abdomen is soft.     Tenderness: There is no abdominal tenderness.  Musculoskeletal:        General: No tenderness or edema. Normal range of motion.     Cervical back: Normal range of motion and neck supple.  Skin:    General: Skin is warm and dry.     Findings: No erythema or rash.          Comments: Skin tear approx 5X2 cm, leg erythemas, warm, and tender  Neurological:     Mental Status: He is alert and oriented to person, place, and time.     Cranial Nerves: No cranial nerve  deficit.     Deep Tendon Reflexes: Reflexes are normal and symmetric.  Psychiatric:        Mood and Affect: Mood and affect normal.        Behavior: Behavior normal.        Thought Content: Thought content normal.        Judgment: Judgment normal.             Assessment & Plan:  Matthew May comes in today with chief complaint of Leg Injury (Hit leg around gas pump 3 days ago skin tear)   Diagnosis and orders addressed:  1. Infected skin tear - cephALEXin (KEFLEX) 500 MG capsule; Take 1 capsule (500 mg total) by mouth 3 (three) times daily.  Dispense: 30 capsule; Refill: 0  2. Type 2 diabetes mellitus with hyperglycemia, without long-term current use of insulin (HCC) - cephALEXin (KEFLEX) 500 MG capsule; Take 1 capsule (500 mg total) by mouth 3 (three) times daily.  Dispense: 30 capsule; Refill: 0  Start Keflex today Vasoline dressing applied in office Report any increased erythemas, fever, discharge, tenderness, or swelling RTO in 10 days  Evelina Dun, FNP

## 2020-07-23 NOTE — Patient Instructions (Signed)
Skin Tear A skin tear is a wound in which the top layers of skin have peeled off from the deeper skin or tissues underneath. This is a common problem as people get older because the skin becomes thinner and more fragile. In addition, some medicines, such as oral corticosteroids, can lead to thinning skin if they are taken for long periods of time. A skin tear is often repaired with tape or skin adhesive strips. Depending on the location of the wound, a bandage (dressing) may be applied over the tape or adhesive strips. Follow these instructions at home: Wound care  Clean the wound as told by your health care provider. You may be instructed to keep the wound dry for the first few days. If you are told to clean the wound: ? Wash the wound as told by your health care provider. This may include using mild soap and water, a wound cleanser, or a salt-water (saline) solution. ? If using soap, rinse the wound with water to remove all soap. ? Do not rub the wound dry. Pat it gently with a clean towel or let it air-dry.  Change any dressings as told by your health care provider. This may include changing the dressing if it gets wet, gets dirty, or starts to smell bad. ? Wash your hands with soap and water for at least 20 seconds before and after you change your bandage (dressing). If soap and water are not available, use hand sanitizer. ? Leave tape or skin adhesive strips in place. These skin closures may need to stay in place for 2 weeks or longer. If adhesive strip edges start to loosen and curl up, you may trim the loose edges. Do not remove adhesive strips completely unless your health care provider tells you to do that.  Check your wound every day for signs of infection. Check for: ? Redness, swelling, or pain. ? More fluid or blood. ? Warmth. ? Pus or a bad smell.  Do not scratch or pick at the wound.  Protect the injured area until it has healed.   Medicines  Take or apply over-the-counter  and prescription medicines only as told by your health care provider.  If you were prescribed an antibiotic medicine, take or apply it as told by your health care provider. Do not stop using the antibiotic even if your condition improves. General instructions  Keep the dressing dry as told by your health care provider.  Do not take baths, swim, use a hot tub, or do anything that puts your wound underwater until your health care provider approves. Ask your health care provider if you may take showers. You may only be allowed to take sponge baths.  Keep all follow-up visits. This is important.   Contact a health care provider if:  You have redness, swelling, or pain around your wound.  You have more fluid or blood coming from your wound.  Your wound, or the area around your wound, feels warm to the touch.  You have pus or a bad smell coming from your wound. Get help right away if:  You have a red streak that goes away from the skin tear.  You have a fever and chills, and your symptoms suddenly get worse. Summary  A skin tear is a wound in which the top layers of skin have peeled off from the deeper skin or tissues underneath.  A skin tear is often repaired with tape or skin adhesive strips, and a bandage (dressing) may be  applied over the tape or the adhesive strips.  Change any dressings as told by your health care provider.  Take or apply over-the-counter and prescription medicines only as told by your health care provider.  Contact a health care provider if you have signs of infection. This information is not intended to replace advice given to you by your health care provider. Make sure you discuss any questions you have with your health care provider. Document Revised: 08/27/2019 Document Reviewed: 08/27/2019 Elsevier Patient Education  [place].

## 2020-08-02 ENCOUNTER — Other Ambulatory Visit: Payer: Self-pay

## 2020-08-02 ENCOUNTER — Encounter: Payer: Self-pay | Admitting: Family

## 2020-08-02 ENCOUNTER — Ambulatory Visit (INDEPENDENT_AMBULATORY_CARE_PROVIDER_SITE_OTHER): Payer: Medicare Other | Admitting: Family

## 2020-08-02 VITALS — BP 136/67 | HR 89 | Temp 97.9°F | Ht 67.0 in | Wt 249.6 lb

## 2020-08-02 DIAGNOSIS — Z5189 Encounter for other specified aftercare: Secondary | ICD-10-CM

## 2020-08-02 DIAGNOSIS — S80922D Unspecified superficial injury of left lower leg, subsequent encounter: Secondary | ICD-10-CM

## 2020-08-02 DIAGNOSIS — M109 Gout, unspecified: Secondary | ICD-10-CM

## 2020-08-02 DIAGNOSIS — M10071 Idiopathic gout, right ankle and foot: Secondary | ICD-10-CM | POA: Diagnosis not present

## 2020-08-02 DIAGNOSIS — L089 Local infection of the skin and subcutaneous tissue, unspecified: Secondary | ICD-10-CM

## 2020-08-02 MED ORDER — APIXABAN 5 MG PO TABS: 5.0000 mg | ORAL_TABLET | Freq: Two times a day (BID) | ORAL | 2 refills | Status: DC

## 2020-08-02 MED ORDER — MUPIROCIN 2 % EX OINT: TOPICAL_OINTMENT | Freq: Two times a day (BID) | CUTANEOUS | 0 refills | Status: DC

## 2020-08-02 MED ORDER — COLCHICINE 0.6 MG PO TABS: ORAL_TABLET | ORAL | 0 refills | Status: DC

## 2020-08-02 NOTE — Progress Notes (Deleted)
Cardiology Office Note   Date:  08/02/2020   ID:  Matthew May, Matthew May 1945/07/30, MRN [id number]  PCP:  Sharion Balloon, FNP  Cardiologist:   Minus Breeding, MD   No chief complaint on file.     History of Present Illness: Matthew May is a 75 y.o. male who presents for followup of his known coronary disease and aortic valve disease.  He has had CABG and TAVR in August.  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   He has a history of coronary artery disease status post coronary artery bypass graft surgery x4 by Dr. Cyndia Bent in 2011 after a NSTEMI. He had a LIMA--> LAD, SVG--> OM, and sequential SVG-->PDA and PLA placed.  He has done well since his surgery but over the past 12 months has developed progressive exertional fatigue, shortness of breath, and chest discomfort. [facility] 12/30/19 showed occluded SVG-> LCx, patent sequential SVG -->PDA and PL, widely patent LIMA -->LAD, widely patent native left main. There was a patent LAD with competitive flow into the distal LAD from LIMA and severe diffuse disease in the mid to distal circumflex with functional total occlusion and collateralization of the second obtuse marginal from right to left collaterals. There was total occlusion mid RCA.   He was evaluated by the multidisciplinary valve team and underwent successful TAVR with a 29 mm Edwards Sapien 3 THV via the TF approach on 01/20/20. Post operative echo showed EF 60-65%, normally functioning TAVR with a mean gradient of 7 mmHg and no PVL. He was discharged home on Eliquis with the addition of a baby aspirin. At one week follow up ECG showed new wide LBBB ( qrs 120ms), 1st deg Av block (PR more prolonged) at 247ms). He was having some  dizziness so a Zio patch was placed. Bp was elevated and losartan was increased. Bmet was supposed to be drawn at PCP office, but never completed   ***    He was in the hospital in July 2019 with presyncope.  He had a cath with results as below.  Cath demonstrated moderate AS.   He was sent home and PFTs were obtained.  He did not have any significant findings consistent with lung disease.  He continues to having dizzy spells since his discharge in late July.   He has been found to have atrial fib.  He has been started on Eliquis.    He also had his Torsemide reduced. His dizziness was much improved at the last visit.    Since I last saw him he has done relatively well.  He has not had any of the dizziness that he was having and has not had any presyncope or syncope.  He denies any chest pressure, neck or arm discomfort.  He is not having any palpitations.  He was able to feel his garden recently without significant limitations.  He has had some lower extremity swelling that he says is getting worse but in retrospect he said that since his bypass.  It is slightly worse left greater than right.  He says he does not watch his salt.  He is on his feet a fair amount.  Past Medical History:  Diagnosis Date  . Atrial  fibrillation ([place])   . CAD (coronary artery disease)    a. s/p NSTEMI 4/11 => s/p CABG (L-LAD, S-OM2, S-PDA/PL);  b.  ETT-Myoview 6/14:  normal study, no ischemia, EF 67%  . Carotid stenosis    Carotid U/S 2/35:  RICA 5-73%, LICA 22-02%; right vertebral flow retrograde suggestive of steel-consider PV consult  . COPD (chronic obstructive pulmonary disease) ([place])   . Diabetes mellitus without complication ([place])   . HLD (hyperlipidemia)   . HTN (hypertension)   . Obesity   . Renal insufficiency   . Severe aortic stenosis   . Subclavian artery stenosis, right ([place])    based upon carotid U/S done 11/2012    Past Surgical History:  Procedure Laterality Date  . CORONARY ARTERY BYPASS GRAFT      2011, LIMA to LAD coronary artery, SVG to OM2 branch of lect circumflex coronary artery, and a sequential SVG to psot descening to posterolateral branches to RCA  . endoscopic vein harvesting     right leg   . HERNIA REPAIR    . RIGHT/LEFT HEART CATH AND CORONARY/GRAFT ANGIOGRAPHY N/A 12/31/2017   Procedure: RIGHT/LEFT HEART CATH AND CORONARY/GRAFT ANGIOGRAPHY;  Surgeon: Leonie Man, MD;  Location: [place] [facility];  Service: Cardiovascular;  Laterality: N/A;  . RIGHT/LEFT HEART CATH AND CORONARY/GRAFT ANGIOGRAPHY N/A 12/30/2019   Procedure: RIGHT/LEFT HEART CATH AND CORONARY/GRAFT ANGIOGRAPHY;  Surgeon: Belva Crome, MD;  Location: [place] [facility];  Service: Cardiovascular;  Laterality: N/A;  . TEE WITHOUT CARDIOVERSION N/A 01/20/2020   Procedure: TRANSESOPHAGEAL ECHOCARDIOGRAM (TEE);  Surgeon: [name], MD;  Location: [place];  Service: Open Heart Surgery;  Laterality: N/A;  . TONSILLECTOMY    . TRANSCATHETER AORTIC VALVE REPLACEMENT, TRANSFEMORAL N/A 01/20/2020   Procedure: TRANSCATHETER AORTIC VALVE REPLACEMENT, TRANSFEMORALUSING EDWARDS SAPIEN 3 29 MM AORTIC THV.;  Surgeon: [name], MD;  Location: [place];  Service: Open Heart Surgery;  Laterality: N/A;     Current Outpatient Medications  Medication Sig Dispense Refill  . acetaminophen (TYLENOL) 325 MG tablet Take 2 tablets (650 mg total) by mouth every 6 (six) hours as needed for mild pain (or Fever >/= 101).    Marland Kitchen albuterol (PROVENTIL HFA;VENTOLIN HFA) 108 (90 Base) MCG/ACT inhaler Inhale 2 puffs into the lungs every 6 (six) hours as needed for wheezing or shortness of breath. 1 Inhaler 0  . apixaban (ELIQUIS) 5 MG TABS tablet Take 1 tablet (5 mg total) by mouth 2 (two) times daily. 60 tablet 2  . aspirin 81 MG chewable tablet Chew 1 tablet (81 mg total) by mouth daily.    Marland Kitchen atorvastatin (LIPITOR) 40 MG tablet TAKE 1 TABLET BY MOUTH  DAILY 90 tablet 1  . BREO ELLIPTA 100-25 MCG/INH AEPB Inhale 1 puff by mouth once  daily 60 each 2  . cephALEXin (KEFLEX) 500 MG capsule Take 1 capsule (500 mg total) by mouth 3 (three) times daily. 30 capsule 0  . cholecalciferol (VITAMIN D3) 25 MCG (1000 UT) tablet Take 1,000 Units by mouth daily.    . colchicine 0.6 MG tablet TAKE 2 TABLETS BY MOUTH NOW, THEN TAKE AN ADDITIONAL TABLET IN 1 HOUR. TAKE 1 TABLET ONCE DAILY THEREAFTER UNTIL SYMPTOMS RESOLVE 30 tablet 0  . dapagliflozin propanediol (FARXIGA) 10 MG TABS tablet Take 10 mg by mouth daily.    Marland Kitchen glimepiride (AMARYL) 2 MG tablet Take 1 tablet by mouth once daily with breakfast 90 tablet 0  . glucose blood (ONETOUCH VERIO) test strip TEST BLOOD  SUGAR DAILY 100 strip 3  . Lancets (ONETOUCH DELICA PLUS [id number]) MISC USE DAILY 100 each 3  . levothyroxine (SYNTHROID) 150 MCG tablet TAKE 1 TABLET BY MOUTH  DAILY 90 tablet 3  . metFORMIN (GLUCOPHAGE) 1000 MG tablet TAKE 1 TABLET BY MOUTH  TWICE DAILY 180 tablet 1  . Misc Natural Products (TART CHERRY ADVANCED PO) Take 1 tablet by mouth daily.     . mupirocin ointment (BACTROBAN) 2 % Apply topically 2 (two) times daily. 22 g 0  . nitroGLYCERIN (NITROSTAT) 0.4 MG SL tablet DISSOLVE ONE TABLET UNDER THE TONGUE EVERY 5 MINUTES AS NEEDED FOR CHEST PAIN.  DO NOT EXCEED A TOTAL OF 3 DOSES IN 15 MINUTES 25 tablet 0  . potassium chloride SA (KLOR-CON) 20 MEQ tablet TAKE 1 TABLET BY MOUTH  DAILY 90 tablet 3  . torsemide (DEMADEX) 20 MG tablet TAKE 2 TABLETS BY MOUTH  DAILY 180 tablet 1  . valsartan (DIOVAN) 40 MG tablet Take 1 tablet (40 mg total) by mouth daily. 90 tablet 3   No current facility-administered medications for this visit.    Allergies:   Prednisone, Januvia [sitagliptin], Jardiance [empagliflozin], and Lisinopril    ROS:  Please see the history of present illness.   Otherwise, review of systems are positive for none.   All other systems are reviewed and negative.    PHYSICAL EXAM: VS:  There were no vitals taken for this visit. , BMI There is no height or weight  on file to calculate BMI. GENERAL:  Well appearing HEENT:  Pupils equal round and reactive, fundi not visualized, oral mucosa unremarkable NECK:  No jugular venous distention, waveform within normal limits, carotid upstroke brisk and symmetric, no bruits, no thyromegaly LYMPHATICS:  No cervical, inguinal adenopathy LUNGS:  Clear to auscultation bilaterally BACK:  No CVA tenderness CHEST:  Unremarkable HEART:  PMI not displaced or sustained,S1 and S2 within normal limits, no S3, no S4, no clicks, no rubs, 3 out of 6 apical systolic murmur mid peaking, radiating at the aortic outflow tract, no diastolic murmurs ABD:  Flat, positive bowel sounds normal in frequency in pitch, no bruits, no rebound, no guarding, no midline pulsatile mass, no hepatomegaly, no splenomegaly EXT:  2 plus pulses throughout, mild to moderate left greater than right edema, no cyanosis no clubbing SKIN:  No rashes no nodules, chronic venous stasis NEURO:  Cranial nerves II through XII grossly intact, motor grossly intact throughout PSYCH:  Cognitively intact, oriented to person place and time    EKG:  EKG is ordered today. The ekg ordered today demonstrates normal sinus rhythm, premature ectopic contractions, lateral T wave changes possible ischemia but unchanged from previous   Recent Labs: 01/16/2020: B Natriuretic Peptide 90.6 01/21/2020: Magnesium 1.9 05/11/2020: ALT 45; BUN 16; Creatinine, Ser 1.08; Hemoglobin 13.7; Platelets 142; Potassium 4.5; Sodium 138; TSH 2.170    Lipid Panel    Component Value Date/Time   CHOL 110 10/17/2019 0855   CHOL 138 12/26/2012 1310   TRIG 138 10/17/2019 0855   TRIG 90 05/23/2013 1452   TRIG 108 12/26/2012 1310   HDL 31 (L) 10/17/2019 0855   HDL 34 (L) 05/23/2013 1452   HDL 33 (L) 12/26/2012 1310   CHOLHDL 3.5 10/17/2019 0855   CHOLHDL 5.9 10/01/2009 0700   VLDL 16 10/01/2009 0700   LDLCALC 55 10/17/2019 0855   LDLCALC 81 05/23/2013 1452   LDLCALC 83 12/26/2012 1310       Wt Readings from Last 3 Encounters:  08/02/20 249 lb 9.6 oz (113.2 kg)  07/23/20 247 lb (112 kg)  05/11/20 244 lb 3.2 oz (110.8 kg)      Other studies Reviewed: Additional studies/ records that were reviewed today include: Labs. Review of the above records demonstrates:  Please see elsewhere in the note.     ASSESSMENT AND PLAN:  ATRIAL FIB:   Mr. Hargis Vandyne Renier has a CHA2DS2 - VASc score of 3.  *** He has not had any symptomatic paroxysms.  No change in therapy.   CORONARY ATHEROSCLEROSIS NATIVE CORONARY ARTERY - He has had CABG.  ***  He had an occluded vein graft as described in the anatomy above.  He has no ongoing angina.  No change in therapy.  HYPERTENSION -  The blood pressure is *** at target.  No change in therapy.    DYSLIPIDEMIA -   LDL was ***69.  I will defer to his primary.    AS/TAVR - He has had TAVR.  *** his was moderate on cath.  I will follow up with an echo.     CAROTID STENOSIS - He had less than 40 to 69% on the right and less on the left in [place] of 2021.  ***  % bilateral stenosis in August of last 2019.   He will need follow up Doppler.   OBESITY - *** He and I have had discussions about this.   DM - His last A1c was *** 8.5.  We discussed this at length today and he understands is not currently in fact going up.   Current medicines are reviewed at length with the patient today.  The patient does not have concerns regarding medicines.  The following changes have been made:  ***  Labs/ tests ordered today include: ***  No orders of the defined types were placed in this encounter.    Disposition:   FU with me ***   Signed, Minus Breeding, MD  08/02/2020 9:35 PM    [facility]

## 2020-08-02 NOTE — Progress Notes (Signed)
Subjective:    Patient ID: Matthew May, male    DOB: 01/22/1946, 75 y.o.   MRN: [id number]  Chief Complaint  Patient presents with  . skin tear    HPI Pt presents to the office today to check left lower infected skin tear. He was seen on 07/23/20 and we started him on Keflex 500 gm TID. He still has one more day left.   He reports his leg is "better". Denies any fever or discharge. He reports his leg is always swollen and looks normal. He also has chronic PAD and has discoloration of his lower legs.   Reports his pain is 6 out 10. He has chronic pain in bilateral legs.   He is requesting his gout and Eliquis  medications refilled today.    Review of Systems  All other systems reviewed and are negative.      Objective:   Physical Exam Vitals reviewed.  Constitutional:      General: He is not in acute distress.    Appearance: He is well-developed and well-nourished. He is obese.  HENT:     Head: Normocephalic.     Mouth/Throat:     Mouth: Oropharynx is clear and moist.  Eyes:     General:        Right eye: No discharge.        Left eye: No discharge.     Pupils: Pupils are equal, round, and reactive to light.  Neck:     Thyroid: No thyromegaly.  Cardiovascular:     Rate and Rhythm: Normal rate and regular rhythm.     Pulses: Intact distal pulses.     Heart sounds: Normal heart sounds. No murmur heard.   Pulmonary:     Effort: Pulmonary effort is normal. No respiratory distress.     Breath sounds: Normal breath sounds. No wheezing.  Abdominal:     General: Bowel sounds are normal. There is no distension.     Palpations: Abdomen is soft.     Tenderness: There is no abdominal tenderness.  Musculoskeletal:        General: No tenderness. Normal range of motion.     Cervical back: Normal range of motion and neck supple.     Right lower leg: Edema (2+) present.     Left lower leg: Edema (2+) present.  Skin:    General: Skin is warm and dry.     Findings: No  erythema or rash.          Comments: Skin tear on left lower lateral leg that is approx 4.5X2.5 cm. No warm, discharge, or tenderness noted  Neurological:     Mental Status: He is alert and oriented to person, place, and time.     Cranial Nerves: No cranial nerve deficit.     Deep Tendon Reflexes: Reflexes are normal and symmetric.  Psychiatric:        Mood and Affect: Mood and affect normal.        Behavior: Behavior normal.        Thought Content: Thought content normal.        Judgment: Judgment normal.          BP 136/67   Pulse 89   Temp 97.9 F (36.6 C) (Temporal)   Ht 5\' 7"  (1.702 m)   Wt 249 lb 9.6 oz (113.2 kg)   BMI 39.09 kg/m   Assessment & Plan:  Matthew May comes in today with chief complaint of  skin tear   Diagnosis and orders addressed:  1. Podagra - colchicine 0.6 MG tablet; TAKE 2 TABLETS BY MOUTH NOW, THEN TAKE AN ADDITIONAL TABLET IN 1 HOUR. TAKE 1 TABLET ONCE DAILY THEREAFTER UNTIL SYMPTOMS RESOLVE  Dispense: 30 tablet; Refill: 0  2. Acute idiopathic gout involving toe of right foot - colchicine 0.6 MG tablet; TAKE 2 TABLETS BY MOUTH NOW, THEN TAKE AN ADDITIONAL TABLET IN 1 HOUR. TAKE 1 TABLET ONCE DAILY THEREAFTER UNTIL SYMPTOMS RESOLVE  Dispense: 30 tablet; Refill: 0  3. Visit for wound check  4. Infected skin tear Bactroban RX given to apply twice a day Complete Keflex Report increased redness, swelling, fever, discharge, or increased pain.  Vaseline dressing given      Evelina Dun, FNP

## 2020-08-02 NOTE — Patient Instructions (Signed)
Skin Tear A skin tear is a wound in which the top layers of skin have peeled off from the deeper skin or tissues underneath. This is a common problem as people get older because the skin becomes thinner and more fragile. In addition, some medicines, such as oral corticosteroids, can lead to thinning skin if they are taken for long periods of time. A skin tear is often repaired with tape or skin adhesive strips. Depending on the location of the wound, a bandage (dressing) may be applied over the tape or adhesive strips. Follow these instructions at home: Wound care  Clean the wound as told by your health care provider. You may be instructed to keep the wound dry for the first few days. If you are told to clean the wound: ? Wash the wound as told by your health care provider. This may include using mild soap and water, a wound cleanser, or a salt-water (saline) solution. ? If using soap, rinse the wound with water to remove all soap. ? Do not rub the wound dry. Pat it gently with a clean towel or let it air-dry.  Change any dressings as told by your health care provider. This may include changing the dressing if it gets wet, gets dirty, or starts to smell bad. ? Wash your hands with soap and water for at least 20 seconds before and after you change your bandage (dressing). If soap and water are not available, use hand sanitizer. ? Leave tape or skin adhesive strips in place. These skin closures may need to stay in place for 2 weeks or longer. If adhesive strip edges start to loosen and curl up, you may trim the loose edges. Do not remove adhesive strips completely unless your health care provider tells you to do that.  Check your wound every day for signs of infection. Check for: ? Redness, swelling, or pain. ? More fluid or blood. ? Warmth. ? Pus or a bad smell.  Do not scratch or pick at the wound.  Protect the injured area until it has healed.   Medicines  Take or apply over-the-counter  and prescription medicines only as told by your health care provider.  If you were prescribed an antibiotic medicine, take or apply it as told by your health care provider. Do not stop using the antibiotic even if your condition improves. General instructions  Keep the dressing dry as told by your health care provider.  Do not take baths, swim, use a hot tub, or do anything that puts your wound underwater until your health care provider approves. Ask your health care provider if you may take showers. You may only be allowed to take sponge baths.  Keep all follow-up visits. This is important.   Contact a health care provider if:  You have redness, swelling, or pain around your wound.  You have more fluid or blood coming from your wound.  Your wound, or the area around your wound, feels warm to the touch.  You have pus or a bad smell coming from your wound. Get help right away if:  You have a red streak that goes away from the skin tear.  You have a fever and chills, and your symptoms suddenly get worse. Summary  A skin tear is a wound in which the top layers of skin have peeled off from the deeper skin or tissues underneath.  A skin tear is often repaired with tape or skin adhesive strips, and a bandage (dressing) may be  applied over the tape or the adhesive strips.  Change any dressings as told by your health care provider.  Take or apply over-the-counter and prescription medicines only as told by your health care provider.  Contact a health care provider if you have signs of infection. This information is not intended to replace advice given to you by your health care provider. Make sure you discuss any questions you have with your health care provider. Document Revised: 08/27/2019 Document Reviewed: 08/27/2019 Elsevier Patient Education  [place].

## 2020-08-03 ENCOUNTER — Ambulatory Visit (INDEPENDENT_AMBULATORY_CARE_PROVIDER_SITE_OTHER): Payer: Medicare Other | Admitting: *Deleted

## 2020-08-03 DIAGNOSIS — Z0000 Encounter for general adult medical examination without abnormal findings: Secondary | ICD-10-CM | POA: Diagnosis not present

## 2020-08-03 NOTE — Patient Instructions (Signed)
[facility] Maintenance Summary and Written Plan of Care  [name] ,  Thank you for allowing me to perform your Medicare Annual Wellness Visit and for your ongoing commitment to your health.   Health Maintenance & Immunization History Health Maintenance  Topic Date Due  . COVID-19 Vaccine (3 - Booster for Moderna series) 03/05/2020  . FOOT EXAM  03/12/2020  . HEMOGLOBIN A1C  11/09/2020  . OPHTHALMOLOGY EXAM  12/16/2020  . Fecal DNA (Cologuard)  10/27/2022  . TETANUS/TDAP  07/23/2030  . INFLUENZA VACCINE  Completed  . Hepatitis C Screening  Completed  . PNA vac Low Risk Adult  Completed  . HPV VACCINES  Aged Out   Immunization History  Administered Date(s) Administered  . Fluad Quad(high Dose 65+) 02/07/2019  . Influenza, High Dose Seasonal PF 03/31/2014, 04/24/2015, 03/29/2017, 03/08/2018  . Influenza-Unspecified 02/03/2013, 03/31/2016, 03/29/2017, 04/30/2020  . Moderna Sars-Covid-2 Vaccination 08/04/2019, 09/04/2019  . Pneumococcal Conjugate-13 09/16/2014  . Pneumococcal Polysaccharide-23 08/06/2012  . Td 07/23/2020    These are the patient goals that we discussed: Goals Addressed            This Visit's Progress   . AWV       08/03/2020 AWV Goal: Exercise for General Health   Patient will verbalize understanding of the benefits of increased physical activity:  Exercising regularly is important. It will improve your overall fitness, flexibility, and endurance.  Regular exercise also will improve your overall health. It can help you control your weight, reduce stress, and improve your bone density.  Over the next year, patient will increase physical activity as tolerated with a goal of at least 150 minutes of moderate physical activity per week.   You can tell that you are exercising at a moderate intensity if your heart starts beating faster and you start breathing faster but can still hold a conversation.  Moderate-intensity  exercise ideas include:  Walking 1 mile (1.6 km) in about 15 minutes  Biking  Hiking  Golfing  Dancing  Water aerobics  Patient will verbalize understanding of everyday activities that increase physical activity by providing examples like the following: ? Yard work, such as: ? Pushing a Conservation officer, nature ? Raking and bagging leaves ? Washing your car ? Pushing a stroller ? Shoveling snow ? Gardening ? Washing windows or floors  Patient will be able to explain general safety guidelines for exercising:   Before you start a new exercise program, talk with your health care provider.  Do not exercise so much that you hurt yourself, feel dizzy, or get very short of breath.  Wear comfortable clothes and wear shoes with good support.  Drink plenty of water while you exercise to prevent dehydration or heat stroke.  Work out until your breathing and your heartbeat get faster.         This is a list of Health Maintenance Items that are overdue or due now: Health Maintenance Due  Topic Date Due  . COVID-19 Vaccine (3 - Booster for Moderna series) 03/05/2020  . FOOT EXAM  03/12/2020     Orders/Referrals Placed Today: No orders of the defined types were placed in this encounter.  (Contact our referral department at [phone] if you have not spoken with someone about your referral appointment within the next 5 days)    Follow-up Plan . Follow-up with Sharion Balloon, FNP as planned . Foot exam to be completed at next follow up appointment

## 2020-08-03 NOTE — Progress Notes (Signed)
MEDICARE ANNUAL WELLNESS VISIT  08/03/2020  Telephone Visit Disclaimer This Medicare AWV was conducted by telephone due to national recommendations for restrictions regarding the COVID-19 Pandemic (e.g. social distancing).  I verified, using two identifiers, that I am speaking with Matthew May or their authorized healthcare agent. I discussed the limitations, risks, security, and privacy concerns of performing an evaluation and management service by telephone and the potential availability of an in-person appointment in the future. The patient expressed understanding and agreed to proceed.  Location of Patient: Home  Location of Provider (nurse):  [facility]  Subjective:    Matthew May is a 75 y.o. male patient of [name], Theador Hawthorne, FNP who had a Medicare Annual Wellness Visit today via telephone. Matthew May is Retired and lives with his wife and grandson. he has 3 children. he reports that he is socially active and does interact with friends/family regularly. he is minimally physically active and enjoys gardening and mowing the yard.  Patient Care Team: Matthew Balloon, FNP as PCP - General (Nurse Practitioner) Matthew Breeding, MD as PCP - Cardiology (Cardiology) Matthew Breeding, MD as Consulting Physician (Cardiology) Matthew May, Matthew May (Pharmacist)  Advanced Directives 08/03/2020 01/16/2020 01/01/2020 12/30/2019 07/30/2019 04/26/2018 12/30/2017  Does Patient Have a Medical Advance Directive? No No No No No No No  Would patient like information on creating a medical advance directive? No - Patient declined - No - Patient declined No - Patient declined No - Patient declined No - Patient declined No - Patient declined    Hospital Utilization Over the Past 12 Months: # of hospitalizations or ER visits: 1 # of surgeries: 1  Review of Systems    Patient reports that his overall health is unchanged compared to last year.  History obtained from chart review  and the patient  Patient Reported Readings (BP, Pulse, CBG, Weight, etc) CBG:131  Pain Assessment Pain : No/denies pain     Current Medications & Allergies (verified) Allergies as of 08/03/2020      Reactions   Prednisone Other (See Comments)   Pt states "sugar went to 580"   Januvia [sitagliptin] Nausea And Vomiting   Jardiance [empagliflozin] Rash   Lisinopril Cough      Medication List       Accurate as of August 03, 2020  8:34 AM. If you have any questions, ask your nurse or doctor.        acetaminophen 325 MG tablet Commonly known as: TYLENOL Take 2 tablets (650 mg total) by mouth every 6 (six) hours as needed for mild pain (or Fever >/= 101).   albuterol 108 (90 Base) MCG/ACT inhaler Commonly known as: VENTOLIN HFA Inhale 2 puffs into the lungs every 6 (six) hours as needed for wheezing or shortness of breath.   apixaban 5 MG Tabs tablet Commonly known as: Eliquis Take 1 tablet (5 mg total) by mouth 2 (two) times daily.   aspirin 81 MG chewable tablet Chew 1 tablet (81 mg total) by mouth daily.   atorvastatin 40 MG tablet Commonly known as: LIPITOR TAKE 1 TABLET BY MOUTH  DAILY   Breo Ellipta 100-25 MCG/INH Aepb Generic drug: fluticasone furoate-vilanterol Inhale 1 puff by mouth once daily   cephALEXin 500 MG capsule Commonly known as: KEFLEX Take 1 capsule (500 mg total) by mouth 3 (three) times daily.   cholecalciferol 25 MCG (1000 UNIT) tablet Commonly known as: VITAMIN D3 Take 1,000 Units by mouth daily.   colchicine  0.6 MG tablet TAKE 2 TABLETS BY MOUTH NOW, THEN TAKE AN ADDITIONAL TABLET IN 1 HOUR. TAKE 1 TABLET ONCE DAILY THEREAFTER UNTIL SYMPTOMS RESOLVE   dapagliflozin propanediol 10 MG Tabs tablet Commonly known as: FARXIGA Take 10 mg by mouth daily.   glimepiride 2 MG tablet Commonly known as: AMARYL Take 1 tablet by mouth once daily with breakfast   levothyroxine 150 MCG tablet Commonly known as: SYNTHROID TAKE 1 TABLET BY MOUTH   DAILY   metFORMIN 1000 MG tablet Commonly known as: GLUCOPHAGE TAKE 1 TABLET BY MOUTH  TWICE DAILY   mupirocin ointment 2 % Commonly known as: BACTROBAN Apply topically 2 (two) times daily.   nitroGLYCERIN 0.4 MG SL tablet Commonly known as: NITROSTAT DISSOLVE ONE TABLET UNDER THE TONGUE EVERY 5 MINUTES AS NEEDED FOR CHEST PAIN.  DO NOT EXCEED A TOTAL OF 3 DOSES IN 15 MINUTES   OneTouch Delica Plus [id number] Misc USE DAILY   OneTouch Verio test strip Generic drug: glucose blood TEST BLOOD SUGAR DAILY   potassium chloride SA 20 MEQ tablet Commonly known as: KLOR-CON TAKE 1 TABLET BY MOUTH  DAILY   TART CHERRY ADVANCED PO Take 1 tablet by mouth daily.   torsemide 20 MG tablet Commonly known as: DEMADEX TAKE 2 TABLETS BY MOUTH  DAILY   valsartan 40 MG tablet Commonly known as: DIOVAN Take 1 tablet (40 mg total) by mouth daily.       History (reviewed): Past Medical History:  Diagnosis Date  . Atrial fibrillation ([place])   . CAD (coronary artery disease)    a. s/p NSTEMI 4/11 => s/p CABG (L-LAD, S-OM2, S-PDA/PL);  b.  ETT-Myoview 6/14:  normal study, no ischemia, EF 67%  . Carotid stenosis    Carotid U/S 4/13:  RICA 2-44%, LICA 01-02%; right vertebral flow retrograde suggestive of steel-consider PV consult  . Cataract   . COPD (chronic obstructive pulmonary disease) ([place])   . Diabetes mellitus without complication ([place])   . HLD (hyperlipidemia)   . HTN (hypertension)   . Obesity   . Renal insufficiency   . Severe aortic stenosis   . Subclavian artery stenosis, right ([place])    based upon carotid U/S done 11/2012   Past Surgical History:  Procedure Laterality Date  . CATARACT EXTRACTION Left   . CORONARY ARTERY BYPASS GRAFT     2011, LIMA to LAD coronary artery, SVG to OM2 branch of lect circumflex coronary artery, and a sequential SVG to psot descening to posterolateral branches to RCA  . endoscopic vein harvesting     right leg   . HERNIA REPAIR    .  RIGHT/LEFT HEART CATH AND CORONARY/GRAFT ANGIOGRAPHY N/A 12/31/2017   Procedure: RIGHT/LEFT HEART CATH AND CORONARY/GRAFT ANGIOGRAPHY;  Surgeon: Leonie Man, MD;  Location: [facility];  Service: Cardiovascular;  Laterality: N/A;  . RIGHT/LEFT HEART CATH AND CORONARY/GRAFT ANGIOGRAPHY N/A 12/30/2019   Procedure: RIGHT/LEFT HEART CATH AND CORONARY/GRAFT ANGIOGRAPHY;  Surgeon: Belva Crome, MD;  Location: [facility];  Service: Cardiovascular;  Laterality: N/A;  . TEE WITHOUT CARDIOVERSION N/A 01/20/2020   Procedure: TRANSESOPHAGEAL ECHOCARDIOGRAM (TEE);  Surgeon: [name], MD;  Location: [facility];  Service: Open Heart Surgery;  Laterality: N/A;  . TONSILLECTOMY    . TRANSCATHETER AORTIC VALVE REPLACEMENT, TRANSFEMORAL N/A 01/20/2020   Procedure: TRANSCATHETER AORTIC VALVE REPLACEMENT, TRANSFEMORALUSING EDWARDS SAPIEN 3 29 MM AORTIC THV.;  Surgeon: [name], MD;  Location: [facility];  Service: Open Heart Surgery;  Laterality: N/A;  Family History  Problem Relation Age of Onset  . Heart disease Father   . Heart attack Father   . Mental illness Mother   . Mental illness Sister   . Diabetes Brother   . Depression Maternal Aunt    Social History   Socioeconomic History  . Marital status: Married    Spouse name: Lucita Ferrara  . Number of children: 3  . Years of education: 8  . Highest education level: 8th grade  Occupational History  . Occupation: Retired    Comment: scrap yard  Tobacco Use  . Smoking status: Former Smoker    Quit date: 07/11/2009    Years since quitting: 11.0  . Smokeless tobacco: Never Used  . Tobacco comment: smoked about 10 cig/day; used to smoke 2 ppd for many years   Vaping Use  . Vaping Use: Never used  Substance and Sexual Activity  . Alcohol use: No    Alcohol/week: 0.0 standard drinks  . Drug use: No  . Sexual activity: Never  Other Topics Concern  . Not on file  Social History Narrative   Lives at home with his wife. He is retired but  his wife continues to work daily. They have adult children that live locally and grandchildren that they spend time with regularly. He is active around his home and yard.    Social Determinants of Health   Financial Resource Strain: Not on file  Food Insecurity: Not on file  Transportation Needs: Not on file  Physical Activity: Not on file  Stress: Not on file  Social Connections: Not on file    Activities of Daily Living In your present state of health, do you have any difficulty performing the following activities: 08/03/2020 01/16/2020  Hearing? N N  Vision? Y N  Comment Cataracts -  Difficulty concentrating or making decisions? N N  Walking or climbing stairs? N Y  Dressing or bathing? N N  Doing errands, shopping? N N  Preparing Food and eating ? N -  Using the Toilet? N -  In the past six months, have you accidently leaked urine? N -  Do you have problems with loss of bowel control? N -  Managing your Medications? N -  Managing your Finances? N -  Housekeeping or managing your Housekeeping? N -  Some recent data might be hidden    Patient Education/ Literacy How often do you need to have someone help you when you read instructions, pamphlets, or other written materials from your doctor or pharmacy?: 1 - Never What is the last grade level you completed in school?: 8th  Exercise Current Exercise Habits: Home exercise routine, Type of exercise: walking, Time (Minutes): 30, Frequency (Times/Week): 5, Weekly Exercise (Minutes/Week): 150, Intensity: Mild, Exercise limited by: cardiac condition(s)  Diet Patient reports consuming 3 meals a day and 2 snack(s) a day Patient reports that his primary diet is: Regular Patient reports that she does have regular access to food.   Depression Screen PHQ 2/9 Scores 08/03/2020 08/02/2020 05/11/2020 02/12/2020 11/26/2019 10/17/2019 07/30/2019  PHQ - 2 Score 0 0 0 0 0 0 0     Fall Risk Fall Risk  08/03/2020 08/02/2020 05/11/2020 11/26/2019 10/17/2019   Falls in the past year? 0 0 0 0 0  Number falls in past yr: - - - - -  Injury with Fall? - - - - -  Risk for fall due to : - - - - -  Risk for fall due to: Comment - - - - -  Follow up - - - - -     Objective:  Matthew Pigeon [place] seemed alert and oriented and he participated appropriately during our telephone visit.  Blood Pressure Weight BMI  BP Readings from Last 3 Encounters:  08/02/20 136/67  07/23/20 135/70  05/11/20 138/69   Wt Readings from Last 3 Encounters:  08/02/20 249 lb 9.6 oz (113.2 kg)  07/23/20 247 lb (112 kg)  05/11/20 244 lb 3.2 oz (110.8 kg)   BMI Readings from Last 1 Encounters:  08/02/20 39.09 kg/m    *Unable to obtain current vital signs, weight, and BMI due to telephone visit type  Hearing/Vision  . Kurtiss did not seem to have difficulty with hearing/understanding during the telephone conversation . Reports that he has had a formal eye exam by an eye care professional within the past year . Reports that he has not had a formal hearing evaluation within the past year *Unable to fully assess hearing and vision during telephone visit type  Cognitive Function: 6CIT Screen 08/03/2020 07/30/2019 04/26/2018  What Year? 0 points 0 points 0 points  What month? 0 points 0 points 0 points  What time? 0 points 0 points 0 points  Count back from 20 0 points 0 points 0 points  Months in reverse 0 points 2 points 0 points  Repeat phrase 6 points 0 points 0 points  Total Score 6 2 0   (Normal:0-7, Significant for Dysfunction: >8)  Normal Cognitive Function Screening: Yes   Immunization & Health Maintenance Record Immunization History  Administered Date(s) Administered  . Fluad Quad(high Dose 65+) 02/07/2019  . Influenza, High Dose Seasonal PF 03/31/2014, 04/24/2015, 03/29/2017, 03/08/2018  . Influenza-Unspecified 02/03/2013, 03/31/2016, 03/29/2017, 04/30/2020  . Moderna Sars-Covid-2 Vaccination 08/04/2019, 09/04/2019  . Pneumococcal Conjugate-13 09/16/2014   . Pneumococcal Polysaccharide-23 08/06/2012  . Td 07/23/2020    Health Maintenance  Topic Date Due  . COVID-19 Vaccine (3 - Booster for Moderna series) 03/05/2020  . FOOT EXAM  03/12/2020  . HEMOGLOBIN A1C  11/09/2020  . OPHTHALMOLOGY EXAM  12/16/2020  . Fecal DNA (Cologuard)  10/27/2022  . TETANUS/TDAP  07/23/2030  . INFLUENZA VACCINE  Completed  . Hepatitis C Screening  Completed  . PNA vac Low Risk Adult  Completed  . HPV VACCINES  Aged Out       Assessment  This is a routine wellness examination for [employer].  Health Maintenance: Due or Overdue Health Maintenance Due  Topic Date Due  . COVID-19 Vaccine (3 - Booster for Moderna series) 03/05/2020  . FOOT EXAM  03/12/2020    Matthew Pigeon [place] does not need a referral for [employer] Assistance: Care Management:   no Social Work:    no Prescription Assistance:  no Nutrition/Diabetes Education:  no   Plan:  Personalized Goals Goals Addressed            This Visit's Progress   . AWV       08/03/2020 AWV Goal: Exercise for General Health   Patient will verbalize understanding of the benefits of increased physical activity:  Exercising regularly is important. It will improve your overall fitness, flexibility, and endurance.  Regular exercise also will improve your overall health. It can help you control your weight, reduce stress, and improve your bone density.  Over the next year, patient will increase physical activity as tolerated with a goal of at least 150 minutes of moderate physical activity per week.   You can tell that you are exercising at a  moderate intensity if your heart starts beating faster and you start breathing faster but can still hold a conversation.  Moderate-intensity exercise ideas include:  Walking 1 mile (1.6 km) in about 15 minutes  Biking  Hiking  Golfing  Dancing  Water aerobics  Patient will verbalize understanding of everyday activities that increase physical  activity by providing examples like the following: ? Yard work, such as: ? Pushing a Conservation officer, nature ? Raking and bagging leaves ? Washing your car ? Pushing a stroller ? Shoveling snow ? Gardening ? Washing windows or floors  Patient will be able to explain general safety guidelines for exercising:   Before you start a new exercise program, talk with your health care provider.  Do not exercise so much that you hurt yourself, feel dizzy, or get very short of breath.  Wear comfortable clothes and wear shoes with good support.  Drink plenty of water while you exercise to prevent dehydration or heat stroke.  Work out until your breathing and your heartbeat get faster.       Personalized Health Maintenance & Screening Recommendations  Diabetic Foot Exam  Lung Cancer Screening Recommended: no (Low Dose CT Chest recommended if Age 5-80 years, 30 pack-year currently smoking OR have quit w/in past 15 years) Hepatitis C Screening recommended: no HIV Screening recommended: no  Advanced Directives: Written information was not prepared per patient's request.  Referrals & Orders No orders of the defined types were placed in this encounter.   Follow-up Plan . Follow-up with Matthew Balloon, FNP as planned . Foot exam to be completed at next follow up appointment . AVS printed and mailed to patient   I have personally reviewed and noted the following in the patient's chart:   . Medical and social history . Use of alcohol, tobacco or illicit drugs  . Current medications and supplements . Functional ability and status . Nutritional status . Physical activity . Advanced directives . List of other physicians . Hospitalizations, surgeries, and ER visits in previous 12 months . Vitals . Screenings to include cognitive, depression, and falls . Referrals and appointments  In addition, I have reviewed and discussed with Matthew Pigeon [place] certain preventive protocols, quality metrics,  and best practice recommendations. A written personalized care plan for preventive services as well as general preventive health recommendations is available and can be mailed to the patient at his request.      Lynnea Ferrier, LPN  12/10/4126

## 2020-08-04 ENCOUNTER — Ambulatory Visit: Payer: Medicare Other | Admitting: Cardiology

## 2020-08-04 DIAGNOSIS — I6523 Occlusion and stenosis of bilateral carotid arteries: Secondary | ICD-10-CM

## 2020-08-04 DIAGNOSIS — Z951 Presence of aortocoronary bypass graft: Secondary | ICD-10-CM

## 2020-08-04 DIAGNOSIS — I480 Paroxysmal atrial fibrillation: Secondary | ICD-10-CM

## 2020-08-04 DIAGNOSIS — Z952 Presence of prosthetic heart valve: Secondary | ICD-10-CM

## 2020-08-04 DIAGNOSIS — G458 Other transient cerebral ischemic attacks and related syndromes: Secondary | ICD-10-CM

## 2020-08-10 ENCOUNTER — Encounter: Payer: Self-pay | Admitting: Family

## 2020-08-10 ENCOUNTER — Ambulatory Visit (INDEPENDENT_AMBULATORY_CARE_PROVIDER_SITE_OTHER): Payer: Medicare Other | Admitting: Family

## 2020-08-10 ENCOUNTER — Other Ambulatory Visit: Payer: Self-pay

## 2020-08-10 VITALS — BP 130/70 | HR 61 | Temp 96.8°F | Ht 67.0 in | Wt 246.8 lb

## 2020-08-10 DIAGNOSIS — I480 Paroxysmal atrial fibrillation: Secondary | ICD-10-CM | POA: Diagnosis not present

## 2020-08-10 DIAGNOSIS — E1165 Type 2 diabetes mellitus with hyperglycemia: Secondary | ICD-10-CM | POA: Diagnosis not present

## 2020-08-10 DIAGNOSIS — I2510 Atherosclerotic heart disease of native coronary artery without angina pectoris: Secondary | ICD-10-CM

## 2020-08-10 DIAGNOSIS — J449 Chronic obstructive pulmonary disease, unspecified: Secondary | ICD-10-CM | POA: Diagnosis not present

## 2020-08-10 DIAGNOSIS — Z952 Presence of prosthetic heart valve: Secondary | ICD-10-CM

## 2020-08-10 DIAGNOSIS — E038 Other specified hypothyroidism: Secondary | ICD-10-CM | POA: Diagnosis not present

## 2020-08-10 DIAGNOSIS — E559 Vitamin D deficiency, unspecified: Secondary | ICD-10-CM

## 2020-08-10 DIAGNOSIS — I10 Essential (primary) hypertension: Secondary | ICD-10-CM

## 2020-08-10 DIAGNOSIS — E785 Hyperlipidemia, unspecified: Secondary | ICD-10-CM

## 2020-08-10 LAB — BAYER DCA HB A1C WAIVED: HB A1C (BAYER DCA - WAIVED): 7.5 % — ABNORMAL HIGH (ref <7.0)

## 2020-08-10 NOTE — Patient Instructions (Signed)
Diabetes Mellitus and Nutrition, Adult When you have diabetes, or diabetes mellitus, it is very important to have healthy eating habits because your blood sugar (glucose) levels are greatly affected by what you eat and drink. Eating healthy foods in the right amounts, at about the same times every day, can help you:  Control your blood glucose.  Lower your risk of heart disease.  Improve your blood pressure.  Reach or maintain a healthy weight. What can affect my meal plan? Every person with diabetes is different, and each person has different needs for a meal plan. Your health care provider may recommend that you work with a dietitian to make a meal plan that is best for you. Your meal plan may vary depending on factors such as:  The calories you need.  The medicines you take.  Your weight.  Your blood glucose, blood pressure, and cholesterol levels.  Your activity level.  Other health conditions you have, such as heart or kidney disease. How do carbohydrates affect me? Carbohydrates, also called carbs, affect your blood glucose level more than any other type of food. Eating carbs naturally raises the amount of glucose in your blood. Carb counting is a method for keeping track of how many carbs you eat. Counting carbs is important to keep your blood glucose at a healthy level, especially if you use insulin or take certain oral diabetes medicines. It is important to know how many carbs you can safely have in each meal. This is different for every person. Your dietitian can help you calculate how many carbs you should have at each meal and for each snack. How does alcohol affect me? Alcohol can cause a sudden decrease in blood glucose (hypoglycemia), especially if you use insulin or take certain oral diabetes medicines. Hypoglycemia can be a life-threatening condition. Symptoms of hypoglycemia, such as sleepiness, dizziness, and confusion, are similar to symptoms of having too much  alcohol.  Do not drink alcohol if: ? Your health care provider tells you not to drink. ? You are pregnant, may be pregnant, or are planning to become pregnant.  If you drink alcohol: ? Do not drink on an empty stomach. ? Limit how much you use to:  0-1 drink a day for women.  0-2 drinks a day for men. ? Be aware of how much alcohol is in your drink. In the [place] one drink equals one 12 oz bottle of beer (355 mL), one 5 oz glass of wine (148 mL), or one 1 oz glass of hard liquor (44 mL). ? Keep yourself hydrated with water, diet soda, or unsweetened iced tea.  Keep in mind that regular soda, juice, and other mixers may contain a lot of sugar and must be counted as carbs. What are tips for following this plan? Reading food labels  Start by checking the serving size on the "Nutrition Facts" label of packaged foods and drinks. The amount of calories, carbs, fats, and other nutrients listed on the label is based on one serving of the item. Many items contain more than one serving per package.  Check the total grams (g) of carbs in one serving. You can calculate the number of servings of carbs in one serving by dividing the total carbs by 15. For example, if a food has 30 g of total carbs per serving, it would be equal to 2 servings of carbs.  Check the number of grams (g) of saturated fats and trans fats in one serving. Choose foods that have   a low amount or none of these fats.  Check the number of milligrams (mg) of salt (sodium) in one serving. Most people should limit total sodium intake to less than 2,300 mg per day.  Always check the nutrition information of foods labeled as "low-fat" or "nonfat." These foods may be higher in added sugar or refined carbs and should be avoided.  Talk to your dietitian to identify your daily goals for nutrients listed on the label. Shopping  Avoid buying canned, pre-made, or processed foods. These foods tend to be high in fat, sodium, and added  sugar.  Shop around the outside edge of the grocery store. This is where you will most often find fresh fruits and vegetables, bulk grains, fresh meats, and fresh dairy. Cooking  Use low-heat cooking methods, such as baking, instead of high-heat cooking methods like deep frying.  Cook using healthy oils, such as olive, canola, or sunflower oil.  Avoid cooking with butter, cream, or high-fat meats. Meal planning  Eat meals and snacks regularly, preferably at the same times every day. Avoid going long periods of time without eating.  Eat foods that are high in fiber, such as fresh fruits, vegetables, beans, and whole grains. Talk with your dietitian about how many servings of carbs you can eat at each meal.  Eat 4-6 oz (112-168 g) of lean protein each day, such as lean meat, chicken, fish, eggs, or tofu. One ounce (oz) of lean protein is equal to: ? 1 oz (28 g) of meat, chicken, or fish. ? 1 egg. ?  cup (62 g) of tofu.  Eat some foods each day that contain healthy fats, such as avocado, nuts, seeds, and fish.   What foods should I eat? Fruits Berries. Apples. Oranges. Peaches. Apricots. Plums. Grapes. Mango. Papaya. Pomegranate. Kiwi. Cherries. Vegetables Lettuce. Spinach. Leafy greens, including kale, chard, collard greens, and mustard greens. Beets. Cauliflower. Cabbage. Broccoli. Carrots. Green beans. Tomatoes. Peppers. Onions. Cucumbers. Brussels sprouts. Grains Whole grains, such as whole-wheat or whole-grain bread, crackers, tortillas, cereal, and pasta. Unsweetened oatmeal. Quinoa. Brown or wild rice. Meats and other proteins Seafood. Poultry without skin. Lean cuts of poultry and beef. Tofu. Nuts. Seeds. Dairy Low-fat or fat-free dairy products such as milk, yogurt, and cheese. The items listed above may not be a complete list of foods and beverages you can eat. Contact a dietitian for more information. What foods should I avoid? Fruits Fruits canned with  syrup. Vegetables Canned vegetables. Frozen vegetables with butter or cream sauce. Grains Refined white flour and flour products such as bread, pasta, snack foods, and cereals. Avoid all processed foods. Meats and other proteins Fatty cuts of meat. Poultry with skin. Breaded or fried meats. Processed meat. Avoid saturated fats. Dairy Full-fat yogurt, cheese, or milk. Beverages Sweetened drinks, such as soda or iced tea. The items listed above may not be a complete list of foods and beverages you should avoid. Contact a dietitian for more information. Questions to ask a health care provider  Do I need to meet with a diabetes educator?  Do I need to meet with a dietitian?  What number can I call if I have questions?  When are the best times to check my blood glucose? Where to find more information:  American Diabetes Association: diabetes.org  Academy of Nutrition and Dietetics: www.eatright.org  National Institute of Diabetes and Digestive and Kidney Diseases: www.niddk.nih.gov  Association of Diabetes Care and Education Specialists: www.diabeteseducator.org Summary  It is important to have healthy eating   habits because your blood sugar (glucose) levels are greatly affected by what you eat and drink.  A healthy meal plan will help you control your blood glucose and maintain a healthy lifestyle.  Your health care provider may recommend that you work with a dietitian to make a meal plan that is best for you.  Keep in mind that carbohydrates (carbs) and alcohol have immediate effects on your blood glucose levels. It is important to count carbs and to use alcohol carefully. This information is not intended to replace advice given to you by your health care provider. Make sure you discuss any questions you have with your health care provider. Document Revised: 04/29/2019 Document Reviewed: 04/29/2019 Elsevier Patient Education  2021 Elsevier Inc.  

## 2020-08-10 NOTE — Progress Notes (Signed)
1 

## 2020-08-10 NOTE — Progress Notes (Signed)
Subjective:    Patient ID: Matthew May, male    DOB: [birth year], 75 y.o.   MRN: [id number]  Chief Complaint  Patient presents with  . Diabetes  . Hypertension   Ptcallsthe office todaychronic follow up. Pt is followed by Cardiologists CAD, aortic stenosis,and A Fib.He had transcatheter aortic valve replacement on 01/20/20. Doing well at this time. Diabetes He presents for his follow-up diabetic visit. He has type 2 diabetes mellitus. His disease course has been stable. There are no hypoglycemic associated symptoms. Associated symptoms include fatigue. Pertinent negatives for diabetes include no blurred vision and no foot paresthesias. Symptoms are stable. Diabetic complications include heart disease. Risk factors for coronary artery disease include diabetes mellitus, dyslipidemia, male sex, hypertension and sedentary lifestyle. He is following a generally unhealthy diet. His overall blood glucose range is 140-180 mg/dl. An ACE inhibitor/angiotensin II receptor blocker is being taken. Eye exam is not current.  Hypertension This is a chronic problem. The current episode started more than 1 year ago. The problem has been waxing and waning since onset. Associated symptoms include malaise/fatigue and peripheral edema. Pertinent negatives include no blurred vision or shortness of breath. Risk factors for coronary artery disease include dyslipidemia, diabetes mellitus, obesity, male gender and sedentary lifestyle. The current treatment provides moderate improvement. Identifiable causes of hypertension include a thyroid problem.  Thyroid Problem Presents for follow-up visit. Symptoms include dry skin, fatigue and hoarse voice. Patient reports no depressed mood. The symptoms have been stable. His past medical history is significant for hyperlipidemia.  Hyperlipidemia This is a chronic problem. The current episode started more than 1 year ago. The problem is controlled. Recent lipid tests were  reviewed and are normal. Exacerbating diseases include hypothyroidism. Pertinent negatives include no shortness of breath. Current antihyperlipidemic treatment includes statins. The current treatment provides moderate improvement of lipids. Risk factors for coronary artery disease include dyslipidemia, male sex, hypertension and a sedentary lifestyle.  COPD States his breathing is "good". Uses albuterol as needed which is usually 2 times a week.     Review of Systems  Constitutional: Positive for fatigue and malaise/fatigue.  HENT: Positive for hoarse voice.   Eyes: Negative for blurred vision.  Respiratory: Negative for shortness of breath.   All other systems reviewed and are negative.      Objective:   Physical Exam Vitals reviewed.  Constitutional:      General: He is not in acute distress.    Appearance: He is well-developed and well-nourished. He is obese.  HENT:     Head: Normocephalic.     Right Ear: Tympanic membrane normal.     Left Ear: Tympanic membrane normal.     Mouth/Throat:     Mouth: Oropharynx is clear and moist.  Eyes:     General:        Right eye: No discharge.        Left eye: No discharge.     Pupils: Pupils are equal, round, and reactive to light.  Neck:     Thyroid: No thyromegaly.  Cardiovascular:     Rate and Rhythm: Normal rate and regular rhythm.     Pulses: Intact distal pulses.     Heart sounds: Normal heart sounds. No murmur heard.   Pulmonary:     Effort: Pulmonary effort is normal. No respiratory distress.     Breath sounds: Normal breath sounds. No wheezing.  Abdominal:     General: Bowel sounds are normal. There is no distension.  Palpations: Abdomen is soft.     Tenderness: There is no abdominal tenderness.  Musculoskeletal:        General: No tenderness.     Cervical back: Normal range of motion and neck supple.     Right lower leg: Edema present.     Left lower leg: Edema present.     Comments: Discoloration of erythemas    Skin:    General: Skin is warm and dry.     Findings: No erythema or rash.  Neurological:     Mental Status: He is alert and oriented to person, place, and time.     Cranial Nerves: No cranial nerve deficit.     Deep Tendon Reflexes: Reflexes are normal and symmetric.  Psychiatric:        Mood and Affect: Mood and affect normal.        Behavior: Behavior normal.        Thought Content: Thought content normal.        Judgment: Judgment normal.    Diabetic Foot Exam - Simple   Simple Foot Form Diabetic Foot exam was performed with the following findings: Yes 08/10/2020 10:52 AM  Visual Inspection No deformities, no ulcerations, no other skin breakdown bilaterally: Yes Sensation Testing Intact to touch and monofilament testing bilaterally: Yes Pulse Check Posterior Tibialis and Dorsalis pulse intact bilaterally: Yes Comments Toenails thick and long, bilateral feet dryness                                                BP 130/70   Pulse 61   Temp (!) 96.8 F (36 C) (Temporal)   Ht 5\' 7"  (1.702 m)   Wt 246 lb 12.8 oz (111.9 kg)   BMI 38.65 kg/m      Assessment & Plan:  [name] comes in today with chief complaint of Diabetes and Hypertension   Diagnosis and orders addressed:  1. Paroxysmal atrial fibrillation (HCC)  2. Primary hypertension  3. Chronic obstructive pulmonary disease, unspecified COPD type ([place])  4. Other specified hypothyroidism   5. Type 2 diabetes mellitus with hyperglycemia, without long-term current use of insulin (HCC)   6. Vitamin D deficiency  7. Morbid obesity ([place])  8. Hyperlipidemia, unspecified hyperlipidemia type  9. S/P TAVR (transcatheter aortic valve replacement)  10. Atherosclerosis of native coronary artery of native heart without angina pectoris   Labs pending Health Maintenance reviewed Diet and exercise encouraged  Follow up plan: 3 months    Evelina Dun, FNP

## 2020-08-11 LAB — CMP14+EGFR
ALT: 50 IU/L — ABNORMAL HIGH (ref 0–44)
AST: 36 IU/L (ref 0–40)
Albumin/Globulin Ratio: 1.5 (ref 1.2–2.2)
Albumin: 4.6 g/dL (ref 3.7–4.7)
Alkaline Phosphatase: 74 IU/L (ref 44–121)
BUN/Creatinine Ratio: 13 (ref 10–24)
BUN: 14 mg/dL (ref 8–27)
Bilirubin Total: 0.4 mg/dL (ref 0.0–1.2)
CO2: 25 mmol/L (ref 20–29)
Calcium: 9.8 mg/dL (ref 8.6–10.2)
Chloride: 96 mmol/L (ref 96–106)
Creatinine, Ser: 1.1 mg/dL (ref 0.76–1.27)
Globulin, Total: 3 g/dL (ref 1.5–4.5)
Glucose: 151 mg/dL — ABNORMAL HIGH (ref 65–99)
Potassium: 4.3 mmol/L (ref 3.5–5.2)
Sodium: 139 mmol/L (ref 134–144)
Total Protein: 7.6 g/dL (ref 6.0–8.5)
eGFR: 71 mL/min/{1.73_m2} (ref >59)

## 2020-08-28 ENCOUNTER — Other Ambulatory Visit: Payer: Self-pay | Admitting: Family

## 2020-10-11 ENCOUNTER — Other Ambulatory Visit: Payer: Self-pay | Admitting: Family

## 2020-10-11 MED ORDER — GLIMEPIRIDE 2 MG PO TABS: 2.0000 mg | ORAL_TABLET | Freq: Every day | ORAL | 0 refills | Status: DC

## 2020-10-11 NOTE — Addendum Note (Signed)
Addended by: Antonietta Barcelona D on: 10/11/2020 10:44 AM   Modules accepted: Orders

## 2020-10-16 ENCOUNTER — Other Ambulatory Visit: Payer: Self-pay | Admitting: Family

## 2020-10-30 ENCOUNTER — Other Ambulatory Visit: Payer: Self-pay | Admitting: Family

## 2020-11-02 ENCOUNTER — Other Ambulatory Visit: Payer: Self-pay | Admitting: Family

## 2020-11-02 DIAGNOSIS — E119 Type 2 diabetes mellitus without complications: Secondary | ICD-10-CM

## 2020-11-02 NOTE — Telephone Encounter (Signed)
  Prescription Request  11/02/2020  What is the name of the medication or equipment? Eliquis 5mg   -  will be out tomorrow of medicine  Have you contacted your pharmacy to request a refill? (if applicable) yes / pharmacy told him to call us  Which pharmacy would you like this sent to? [facility]   Patient notified that their request is being sent to the clinical staff for review and that they should receive a response within 2 business days.

## 2020-11-09 NOTE — Progress Notes (Signed)
Cardiology Office Note   Date:  11/10/2020   ID:  Matthew May, DOB Jun 04, 1946, MRN [id number]  PCP:  Sharion Balloon, FNP  Cardiologist:   Minus Breeding, MD   Chief Complaint  Patient presents with  . Fatigue      History of Present Illness: Matthew May is a 75 y.o. male who presents for followup after TAVR.  He has a history of CABG.  In July 2021 he had cath with occluded SVG-> LCx, patent sequential SVG -->PDA and PL, widely patent LIMA -->LAD, widely patent native left main. There was a patent LAD with competitive flow into the distal LAD from LIMA and severe diffuse disease in the mid to distal circumflex with functional total occlusion and collateralization of the second obtuse marginal from right to left collaterals. There was total occlusion mid RCA.   He had dizziness post TAVR last fall but a monitor demonstrated no arrhythmias.   He is to follow up with an echo in Sept.   Doing yard work.  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  He still has a garden and already has some tomatoes.  He has some chronic dyspnea with exertion but this is not particularly bad.  His dizziness is not as bad and he blames it on his new diabetes medicine.  However, he has not had any presyncope or syncope.  Not noticing any palpitations.  He has had no chest pressure, neck or arm discomfort.  He has had chronic lower extremity swelling.   Past Medical History:  Diagnosis Date  . Atrial fibrillation ([place])   . CAD (coronary artery disease)    a. s/p NSTEMI 4/11 => s/p CABG (L-LAD, S-OM2, S-PDA/PL);  b.  ETT-Myoview 6/14:  normal study, no ischemia, EF 67%  . Carotid stenosis    Carotid U/S 7/85:  RICA 8-85%, LICA 02-77%; right vertebral flow retrograde suggestive of steel-consider PV consult  . Cataract   . COPD (chronic obstructive pulmonary disease) ([place])   . Diabetes mellitus without complication ([place])   . HLD (hyperlipidemia)   . HTN (hypertension)   . Obesity   . Renal  insufficiency   . Severe aortic stenosis   . Subclavian artery stenosis, right ([place])    based upon carotid U/S done 11/2012    Past Surgical History:  Procedure Laterality Date  . CATARACT EXTRACTION Left   . CORONARY ARTERY BYPASS GRAFT     2011, LIMA to LAD coronary artery, SVG to OM2 branch of lect circumflex coronary artery, and a sequential SVG to psot descening to posterolateral branches to RCA  . endoscopic vein harvesting     right leg   . HERNIA REPAIR    . RIGHT/LEFT HEART CATH AND CORONARY/GRAFT ANGIOGRAPHY N/A 12/31/2017   Procedure: RIGHT/LEFT HEART CATH AND CORONARY/GRAFT ANGIOGRAPHY;  Surgeon: Leonie Man, MD;  Location: [facility];  Service: Cardiovascular;  Laterality: N/A;  . RIGHT/LEFT HEART CATH AND CORONARY/GRAFT ANGIOGRAPHY N/A 12/30/2019   Procedure: RIGHT/LEFT HEART CATH AND CORONARY/GRAFT ANGIOGRAPHY;  Surgeon: Belva Crome, MD;  Location: [facility];  Service: Cardiovascular;  Laterality: N/A;  . TEE WITHOUT CARDIOVERSION N/A 01/20/2020   Procedure: TRANSESOPHAGEAL ECHOCARDIOGRAM (TEE);  Surgeon: [name], MD;  Location: [facility];  Service: Open Heart Surgery;  Laterality: N/A;  . TONSILLECTOMY    . TRANSCATHETER AORTIC VALVE REPLACEMENT, TRANSFEMORAL N/A 01/20/2020   Procedure: TRANSCATHETER AORTIC VALVE REPLACEMENT, TRANSFEMORALUSING EDWARDS SAPIEN 3 29 MM AORTIC THV.;  Surgeon: [name], MD;  Location: [facility];  Service: Open Heart Surgery;  Laterality: N/A;     Current Outpatient Medications  Medication Sig Dispense Refill  . acetaminophen (TYLENOL) 325 MG tablet Take 2 tablets (650 mg total) by mouth every 6 (six) hours as needed for mild pain (or Fever >/= 101).    Marland Kitchen albuterol (PROVENTIL HFA;VENTOLIN HFA) 108 (90 Base) MCG/ACT inhaler Inhale 2 puffs into the lungs every 6 (six) hours as needed for wheezing or shortness of breath. 1 Inhaler 0  . atorvastatin (LIPITOR) 40 MG tablet TAKE 1 TABLET BY MOUTH  DAILY 90 tablet 0  .  BREO ELLIPTA 100-25 MCG/INH AEPB Inhale 1 puff by mouth once daily 60 each 2  . cholecalciferol (VITAMIN D3) 25 MCG (1000 UT) tablet Take 1,000 Units by mouth daily.    . colchicine 0.6 MG tablet TAKE 2 TABLETS BY MOUTH NOW, THEN TAKE AN ADDITIONAL TABLET IN 1 HOUR. TAKE 1 TABLET ONCE DAILY THEREAFTER UNTIL SYMPTOMS RESOLVE 30 tablet 0  . dapagliflozin propanediol (FARXIGA) 10 MG TABS tablet Take 10 mg by mouth daily.    Marland Kitchen ELIQUIS 5 MG TABS tablet Take 1 tablet by mouth twice daily 60 tablet 0  . glimepiride (AMARYL) 2 MG tablet Take 1 tablet (2 mg total) by mouth daily with breakfast. 90 tablet 0  . levothyroxine (SYNTHROID) 150 MCG tablet TAKE 1 TABLET BY MOUTH  DAILY 90 tablet 3  . metFORMIN (GLUCOPHAGE) 1000 MG tablet TAKE 1 TABLET BY MOUTH  TWICE DAILY 180 tablet 0  . Misc Natural Products (TART CHERRY ADVANCED PO) Take 1 tablet by mouth daily.     . mupirocin ointment (BACTROBAN) 2 % Apply topically 2 (two) times daily. 22 g 0  . nitroGLYCERIN (NITROSTAT) 0.4 MG SL tablet DISSOLVE ONE TABLET UNDER THE TONGUE EVERY 5 MINUTES AS NEEDED FOR CHEST PAIN.  DO NOT EXCEED A TOTAL OF 3 DOSES IN 15 MINUTES 25 tablet 0  . potassium chloride SA (KLOR-CON) 20 MEQ tablet TAKE 1 TABLET BY MOUTH  DAILY 90 tablet 3  . torsemide (DEMADEX) 20 MG tablet TAKE 2 TABLETS BY MOUTH  DAILY 180 tablet 1  . valsartan (DIOVAN) 40 MG tablet Take 1 tablet (40 mg total) by mouth daily. 90 tablet 3  . glucose blood (ONETOUCH VERIO) test strip TEST BLOOD SUGAR DAILY 100 strip 3  . Lancets (ONETOUCH DELICA PLUS [id number]) MISC Check BS daily Dx E11.9 100 each 3   No current facility-administered medications for this visit.    Allergies:   Prednisone, Januvia [sitagliptin], Jardiance [empagliflozin], and Lisinopril    ROS:  Please see the history of present illness.   Otherwise, review of systems are positive for none.   All other systems are reviewed and negative.    PHYSICAL EXAM: VS:  BP 130/70   Pulse 90   Ht 5'  6.5" (1.689 m)   Wt 245 lb (111.1 kg)   BMI 38.95 kg/m  , BMI Body mass index is 38.95 kg/m. GENERAL:  Well appearing NECK:  No jugular venous distention, waveform within normal limits, carotid upstroke brisk and symmetric, no bruits, no thyromegaly LUNGS:  Clear to auscultation bilaterally CHEST:  Well healed sternotomy scar. HEART:  PMI not displaced or sustained,S1 and S2 within normal limits, no S3, no S4, no clicks, no rubs, very brief apical systolic murmur nonradiating, no diastolic murmurs ABD:  Flat, positive bowel sounds normal in frequency in pitch, no bruits, no rebound, no guarding, no midline  pulsatile mass, no hepatomegaly, no splenomegaly EXT:  2 plus pulses throughout, mild left greater than right lower extremity edema, no cyanosis no clubbing, chronic venous stasis changes   EKG:  EKG is  ordered today. The ekg ordered today demonstrates normal sinus rhythm, rate 89, nonspecific ST-T wave changes   Recent Labs: 01/16/2020: B Natriuretic Peptide 90.6 01/21/2020: Magnesium 1.9 05/11/2020: Hemoglobin 13.7; Platelets 142; TSH 2.170 08/10/2020: ALT 50; BUN 14; Creatinine, Ser 1.10; Potassium 4.3; Sodium 139    Lipid Panel    Component Value Date/Time   CHOL 110 10/17/2019 0855   CHOL 138 12/26/2012 1310   TRIG 138 10/17/2019 0855   TRIG 90 05/23/2013 1452   TRIG 108 12/26/2012 1310   HDL 31 (L) 10/17/2019 0855   HDL 34 (L) 05/23/2013 1452   HDL 33 (L) 12/26/2012 1310   CHOLHDL 3.5 10/17/2019 0855   CHOLHDL 5.9 10/01/2009 0700   VLDL 16 10/01/2009 0700   LDLCALC 55 10/17/2019 0855   LDLCALC 81 05/23/2013 1452   LDLCALC 83 12/26/2012 1310      Wt Readings from Last 3 Encounters:  11/10/20 245 lb (111.1 kg)  08/10/20 246 lb 12.8 oz (111.9 kg)  08/02/20 249 lb 9.6 oz (113.2 kg)      Other studies Reviewed: Additional studies/ records that were reviewed today include: Labs Review of the above records demonstrates:  Please see elsewhere in the note.      ASSESSMENT AND PLAN:  ATRIAL FIB:   Mr. Jule Schlabach Schnepp has a CHA2DS2 - VASc score of 3.  He tolerates anticoagulation.  No change in therapy.  He seems to be maintaining sinus.   CORONARY ATHEROSCLEROSIS NATIVE CORONARY ARTERY - He has disease as above.  He has had no angina.  No change in therapy.  He will continue with risk reduction.   HYPERTENSION -  The blood pressure is controlled.  He will continue the meds as listed.   DYSLIPIDEMIA -   LDL was 55 with an HDL of 31.  I will order fasting lipid profile has been around a year.   TAVR - This was stable on follow-up echo.  He understands endocarditis prophylaxis.    He is going to go back to the [facility] in September and he will have a repeat echocardiogram.  He was supposed to stop his aspirin in February and he will stop this.  CAROTID STENOSIS - He had right less than 69% stenosis and left no significant plaque in April of this year.  He is overdue for follow-up and I will schedule this.   OBESITY - We talked about this.  He eats too much bread.  DM - His last A1c was up to 7.5 from 7.0.  He understands this is going in the wrong direction.  This is being actively managed.  Current medicines are reviewed at length with the patient today.  The patient does not have concerns regarding medicines.  The following changes have been made: As above  Labs/ tests ordered today include:   Orders Placed This Encounter  Procedures  . US Carotid Bilateral  . Lipid panel  . EKG 12-Lead     Disposition:   FU with me 12 months   Signed, Minus Breeding, MD  11/10/2020 2:17 PM    [facility]

## 2020-11-10 ENCOUNTER — Encounter: Payer: Self-pay | Admitting: Cardiology

## 2020-11-10 ENCOUNTER — Other Ambulatory Visit: Payer: Self-pay | Admitting: Family

## 2020-11-10 ENCOUNTER — Ambulatory Visit (INDEPENDENT_AMBULATORY_CARE_PROVIDER_SITE_OTHER): Payer: Medicare Other | Admitting: Cardiology

## 2020-11-10 ENCOUNTER — Other Ambulatory Visit: Payer: Self-pay

## 2020-11-10 VITALS — BP 130/70 | HR 90 | Ht 66.5 in | Wt 245.0 lb

## 2020-11-10 DIAGNOSIS — E785 Hyperlipidemia, unspecified: Secondary | ICD-10-CM | POA: Diagnosis not present

## 2020-11-10 DIAGNOSIS — I447 Left bundle-branch block, unspecified: Secondary | ICD-10-CM | POA: Diagnosis not present

## 2020-11-10 DIAGNOSIS — E1159 Type 2 diabetes mellitus with other circulatory complications: Secondary | ICD-10-CM

## 2020-11-10 DIAGNOSIS — I6529 Occlusion and stenosis of unspecified carotid artery: Secondary | ICD-10-CM

## 2020-11-10 DIAGNOSIS — I2510 Atherosclerotic heart disease of native coronary artery without angina pectoris: Secondary | ICD-10-CM | POA: Diagnosis not present

## 2020-11-10 DIAGNOSIS — R609 Edema, unspecified: Secondary | ICD-10-CM

## 2020-11-10 DIAGNOSIS — I480 Paroxysmal atrial fibrillation: Secondary | ICD-10-CM

## 2020-11-10 DIAGNOSIS — Z79899 Other long term (current) drug therapy: Secondary | ICD-10-CM

## 2020-11-10 NOTE — Patient Instructions (Signed)
Medication Instructions:  Please discontinue your Aspirin. Continue all other medications as listed.  *If you need a refill on your cardiac medications before your next appointment, please call your pharmacy*  Lab Work: Please have fasting lab work at [facility]  (Lipid)  If you have labs (blood work) drawn today and your tests are completely normal, you will receive your results only by: Marland Kitchen MyChart Message (if you have MyChart) OR . A paper copy in the mail If you have any lab test that is abnormal or we need to change your treatment, we will call you to review the results.  Testing/Procedures: Your physician has requested that you have a carotid duplex. This test is an ultrasound of the carotid arteries in your neck. It looks at blood flow through these arteries that supply the brain with blood. Allow one hour for this exam. There are no restrictions or special instructions.  This will be completed at [facility] and you will be contacted to be scheduled.  Follow-Up: At [facility], you and your health needs are our priority.  As part of our continuing mission to provide you with exceptional heart care, we have created designated Provider Care Teams.  These Care Teams include your primary Cardiologist (physician) and Advanced Practice Providers (APPs -  Physician Assistants and Nurse Practitioners) who all work together to provide you with the care you need, when you need it.  We recommend signing up for the patient portal called "MyChart".  Sign up information is provided on this After Visit Summary.  MyChart is used to connect with patients for Virtual Visits (Telemedicine).  Patients are able to view lab/test results, encounter notes, upcoming appointments, etc.  Non-urgent messages can be sent to your provider as well.   To learn more about what you can do with MyChart, go to [email or web address].    Your next appointment:   1 year(s)  The format for your next appointment:   In  Person  Provider:   Minus Breeding, MD   Thank you for choosing [facility]!!

## 2020-11-11 ENCOUNTER — Encounter: Payer: Self-pay | Admitting: Family

## 2020-11-11 ENCOUNTER — Ambulatory Visit (INDEPENDENT_AMBULATORY_CARE_PROVIDER_SITE_OTHER): Payer: Medicare Other | Admitting: Family

## 2020-11-11 VITALS — BP 137/65 | HR 68 | Temp 97.5°F | Ht 67.0 in | Wt 248.4 lb

## 2020-11-11 DIAGNOSIS — E1165 Type 2 diabetes mellitus with hyperglycemia: Secondary | ICD-10-CM | POA: Diagnosis not present

## 2020-11-11 DIAGNOSIS — I480 Paroxysmal atrial fibrillation: Secondary | ICD-10-CM

## 2020-11-11 DIAGNOSIS — E038 Other specified hypothyroidism: Secondary | ICD-10-CM | POA: Diagnosis not present

## 2020-11-11 DIAGNOSIS — I10 Essential (primary) hypertension: Secondary | ICD-10-CM

## 2020-11-11 DIAGNOSIS — Z23 Encounter for immunization: Secondary | ICD-10-CM

## 2020-11-11 DIAGNOSIS — E785 Hyperlipidemia, unspecified: Secondary | ICD-10-CM

## 2020-11-11 DIAGNOSIS — J449 Chronic obstructive pulmonary disease, unspecified: Secondary | ICD-10-CM

## 2020-11-11 LAB — CMP14+EGFR
ALT: 48 IU/L — ABNORMAL HIGH (ref 0–44)
AST: 31 IU/L (ref 0–40)
Albumin/Globulin Ratio: 1.6 (ref 1.2–2.2)
Albumin: 4.6 g/dL (ref 3.7–4.7)
Alkaline Phosphatase: 70 IU/L (ref 44–121)
BUN/Creatinine Ratio: 15 (ref 10–24)
BUN: 13 mg/dL (ref 8–27)
Bilirubin Total: 0.4 mg/dL (ref 0.0–1.2)
CO2: 25 mmol/L (ref 20–29)
Calcium: 9.3 mg/dL (ref 8.6–10.2)
Chloride: 97 mmol/L (ref 96–106)
Creatinine, Ser: 0.87 mg/dL (ref 0.76–1.27)
Globulin, Total: 2.8 g/dL (ref 1.5–4.5)
Glucose: 147 mg/dL — ABNORMAL HIGH (ref 65–99)
Potassium: 4.2 mmol/L (ref 3.5–5.2)
Sodium: 136 mmol/L (ref 134–144)
Total Protein: 7.4 g/dL (ref 6.0–8.5)
eGFR: 91 mL/min/{1.73_m2} (ref >59)

## 2020-11-11 LAB — CBC WITH DIFFERENTIAL/PLATELET
Basophils Absolute: 0.1 10*3/uL (ref 0.0–0.2)
Basos: 1 %
EOS (ABSOLUTE): 0.2 10*3/uL (ref 0.0–0.4)
Eos: 3 %
Hematocrit: 39.2 % (ref 37.5–51.0)
Hemoglobin: 13.1 g/dL (ref 13.0–17.7)
Immature Grans (Abs): 0.1 10*3/uL (ref 0.0–0.1)
Immature Granulocytes: 1 %
Lymphocytes Absolute: 1.4 10*3/uL (ref 0.7–3.1)
Lymphs: 19 %
MCH: 28.6 pg (ref 26.6–33.0)
MCHC: 33.4 g/dL (ref 31.5–35.7)
MCV: 86 fL (ref 79–97)
Monocytes Absolute: 0.4 10*3/uL (ref 0.1–0.9)
Monocytes: 5 %
Neutrophils Absolute: 5.1 10*3/uL (ref 1.4–7.0)
Neutrophils: 71 %
Platelets: 139 10*3/uL — ABNORMAL LOW (ref 150–450)
RBC: 4.58 x10E6/uL (ref 4.14–5.80)
RDW: 12.5 % (ref 11.6–15.4)
WBC: 7.2 10*3/uL (ref 3.4–10.8)

## 2020-11-11 LAB — LIPID PANEL
Chol/HDL Ratio: 5 ratio (ref 0.0–5.0)
Cholesterol, Total: 129 mg/dL (ref 100–199)
HDL: 26 mg/dL — ABNORMAL LOW (ref >39)
LDL Chol Calc (NIH): 77 mg/dL (ref 0–99)
Triglycerides: 147 mg/dL (ref 0–149)
VLDL Cholesterol Cal: 26 mg/dL (ref 5–40)

## 2020-11-11 LAB — BAYER DCA HB A1C WAIVED: HB A1C (BAYER DCA - WAIVED): 7.8 % — ABNORMAL HIGH (ref <7.0)

## 2020-11-11 NOTE — Patient Instructions (Signed)
Health Maintenance After Age 73 After age 73, you are at a higher risk for certain long-term diseases and infections as well as injuries from falls. Falls are a major cause of broken bones and head injuries in people who are older than age 73. Getting regular preventive care can help to keep you healthy and well. Preventive care includes getting regular testing and making lifestyle changes as recommended by your health care provider. Talk with your health care provider about:  Which screenings and tests you should have. A screening is a test that checks for a disease when you have no symptoms.  A diet and exercise plan that is right for you. What should I know about screenings and tests to prevent falls? Screening and testing are the best ways to find a health problem early. Early diagnosis and treatment give you the best chance of managing medical conditions that are common after age 73. Certain conditions and lifestyle choices may make you more likely to have a fall. Your health care provider may recommend:  Regular vision checks. Poor vision and conditions such as cataracts can make you more likely to have a fall. If you wear glasses, make sure to get your prescription updated if your vision changes.  Medicine review. Work with your health care provider to regularly review all of the medicines you are taking, including over-the-counter medicines. Ask your health care provider about any side effects that may make you more likely to have a fall. Tell your health care provider if any medicines that you take make you feel dizzy or sleepy.  Osteoporosis screening. Osteoporosis is a condition that causes the bones to get weaker. This can make the bones weak and cause them to break more easily.  Blood pressure screening. Blood pressure changes and medicines to control blood pressure can make you feel dizzy.  Strength and balance checks. Your health care provider may recommend certain tests to check your  strength and balance while standing, walking, or changing positions.  Foot health exam. Foot pain and numbness, as well as not wearing proper footwear, can make you more likely to have a fall.  Depression screening. You may be more likely to have a fall if you have a fear of falling, feel emotionally low, or feel unable to do activities that you used to do.  Alcohol use screening. Using too much alcohol can affect your balance and may make you more likely to have a fall. What actions can I take to lower my risk of falls? General instructions  Talk with your health care provider about your risks for falling. Tell your health care provider if: ? You fall. Be sure to tell your health care provider about all falls, even ones that seem minor. ? You feel dizzy, sleepy, or off-balance.  Take over-the-counter and prescription medicines only as told by your health care provider. These include any supplements.  Eat a healthy diet and maintain a healthy weight. A healthy diet includes low-fat dairy products, low-fat (lean) meats, and fiber from whole grains, beans, and lots of fruits and vegetables. Home safety  Remove any tripping hazards, such as rugs, cords, and clutter.  Install safety equipment such as grab bars in bathrooms and safety rails on stairs.  Keep rooms and walkways well-lit. Activity  Follow a regular exercise program to stay fit. This will help you maintain your balance. Ask your health care provider what types of exercise are appropriate for you.  If you need a cane or walker,   use it as recommended by your health care provider.  Wear supportive shoes that have nonskid soles.   Lifestyle  Do not drink alcohol if your health care provider tells you not to drink.  If you drink alcohol, limit how much you have: ? 0-1 drink a day for women. ? 0-2 drinks a day for men.  Be aware of how much alcohol is in your drink. In the [place] one drink equals one typical bottle of beer (12  oz), one-half glass of wine (5 oz), or one shot of hard liquor (1 oz).  Do not use any products that contain nicotine or tobacco, such as cigarettes and e-cigarettes. If you need help quitting, ask your health care provider. Summary  Having a healthy lifestyle and getting preventive care can help to protect your health and wellness after age 73.  Screening and testing are the best way to find a health problem early and help you avoid having a fall. Early diagnosis and treatment give you the best chance for managing medical conditions that are more common for people who are older than age 73.  Falls are a major cause of broken bones and head injuries in people who are older than age 73. Take precautions to prevent a fall at home.  Work with your health care provider to learn what changes you can make to improve your health and wellness and to prevent falls. This information is not intended to replace advice given to you by your health care provider. Make sure you discuss any questions you have with your health care provider. Document Revised: 09/12/2018 Document Reviewed: 04/04/2017 Elsevier Patient Education  2021 [employer]vier Inc.  

## 2020-11-11 NOTE — Progress Notes (Signed)
Subjective:    Patient ID: Matthew May, male    DOB: 01/13/1946, 75 y.o.   MRN: [id number]  Chief Complaint  Patient presents with   Medical Management of Chronic Issues    Pt calls the office today chronic follow up. Pt is followed by Cardiologists CAD, aortic stenosis, and A Fib. 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 Doing well at this time.  He said he has a garden that he is working on.  Diabetes He presents for his follow-up diabetic visit. He has type 2 diabetes mellitus. His disease course has been stable. There are no hypoglycemic associated symptoms. Associated symptoms include blurred vision, fatigue and foot paresthesias. Symptoms are stable. Diabetic complications include heart disease, nephropathy and peripheral neuropathy. Risk factors for coronary artery disease include dyslipidemia, diabetes mellitus, male sex, hypertension and sedentary lifestyle. He is following a generally unhealthy diet. His overall blood glucose range is 140-180 mg/dl. An ACE inhibitor/angiotensin II receptor blocker is being taken. Eye exam is not current.  Thyroid Problem Presents for follow-up visit. Symptoms include fatigue and leg swelling. Patient reports no diarrhea or dry skin. The symptoms have been stable. His past medical history is significant for hyperlipidemia. There is no history of heart failure.  Hypertension This is a chronic problem. The current episode started more than 1 year ago. The problem has been resolved since onset. The problem is controlled. Associated symptoms include blurred vision, malaise/fatigue, peripheral edema and shortness of breath. Risk factors for coronary artery disease include dyslipidemia, diabetes mellitus, obesity and sedentary lifestyle. The current treatment provides moderate improvement. Hypertensive end-organ damage includes kidney disease and CAD/MI. There is no history of heart failure. Identifiable causes of hypertension include a  thyroid problem.  Hyperlipidemia This is a chronic problem. The current episode started more than 1 year ago. The problem is controlled. Exacerbating diseases include hypothyroidism and obesity. Associated symptoms include shortness of breath. Current antihyperlipidemic treatment includes statins. The current treatment provides moderate improvement of lipids. Risk factors for coronary artery disease include dyslipidemia, diabetes mellitus, male sex, hypertension and a sedentary lifestyle.  COPD States this is stable. Not using breo because it makes him "sick", but using albuterol 2 times.    Review of Systems  Constitutional:  Positive for fatigue and malaise/fatigue.  Eyes:  Positive for blurred vision.  Respiratory:  Positive for shortness of breath.   Cardiovascular:  Positive for leg swelling.  Gastrointestinal:  Negative for diarrhea.  All other systems reviewed and are negative.     Objective:   Physical Exam Vitals reviewed.  Constitutional:      General: He is not in acute distress.    Appearance: He is well-developed.  HENT:     Head: Normocephalic.     Right Ear: Tympanic membrane normal.     Left Ear: Tympanic membrane normal.  Eyes:     General:        Right eye: No discharge.        Left eye: No discharge.     Pupils: Pupils are equal, round, and reactive to light.  Neck:     Thyroid: No thyromegaly.  Cardiovascular:     Rate and Rhythm: Normal rate and regular rhythm.     Heart sounds: Normal heart sounds. No murmur heard. Pulmonary:     Effort: Pulmonary effort is normal. No respiratory distress.     Breath sounds: Normal breath sounds. No wheezing.  Abdominal:     General: Bowel sounds are  normal. There is no distension.     Palpations: Abdomen is soft.     Tenderness: There is no abdominal tenderness.  Musculoskeletal:        General: No tenderness. Normal range of motion.     Cervical back: Normal range of motion and neck supple.     Right lower leg:  Edema (2+) present.     Left lower leg: Edema (2+) present.  Skin:    General: Skin is warm and dry.     Findings: No erythema or rash.  Neurological:     Mental Status: He is alert and oriented to person, place, and time.     Cranial Nerves: No cranial nerve deficit.     Deep Tendon Reflexes: Reflexes are normal and symmetric.  Psychiatric:        Behavior: Behavior normal.        Thought Content: Thought content normal.        Judgment: Judgment normal.      BP 137/65   Pulse 68   Temp (!) 97.5 F (36.4 C) (Temporal)   Ht '5\' 7"'  (1.702 m)   Wt 248 lb 6.4 oz (112.7 kg)   SpO2 94%   BMI 38.90 kg/m      Assessment & Plan:  Matthew May comes in today with chief complaint of Medical Management of Chronic Issues   Diagnosis and orders addressed:  1. Type 2 diabetes mellitus with hyperglycemia, without long-term current use of insulin (HCC) - Bayer DCA Hb A1c Waived - CBC with Differential/Platelet - CMP14+EGFR  2. Other specified hypothyroidism - CBC with Differential/Platelet - CMP14+EGFR  3. Chronic obstructive pulmonary disease, unspecified COPD type ([place]) - CBC with Differential/Platelet - CMP14+EGFR  4. Primary hypertension - CBC with Differential/Platelet - CMP14+EGFR  5. Paroxysmal atrial fibrillation (HCC) - CBC with Differential/Platelet - CMP14+EGFR  6. Hyperlipidemia, unspecified hyperlipidemia type - CBC with Differential/Platelet - CMP14+EGFR - Lipid panel  7. Morbid obesity ([place])  - CBC with Differential/Platelet - CMP14+EGFR    Labs pending Health Maintenance reviewed Diet and exercise encouraged  Follow up plan: 4 months   Evelina Dun, FNP

## 2020-11-12 ENCOUNTER — Other Ambulatory Visit: Payer: Self-pay | Admitting: Family

## 2020-11-22 ENCOUNTER — Ambulatory Visit (HOSPITAL_COMMUNITY): Payer: Medicare Other

## 2020-11-24 ENCOUNTER — Ambulatory Visit (HOSPITAL_COMMUNITY)
Admission: RE | Admit: 2020-11-24 | Discharge: 2020-11-24 | Disposition: A | Discharge status: Home / Self Care | Payer: Medicare Other | Source: Ambulatory Visit | Attending: Cardiology | Admitting: Cardiology

## 2020-11-24 ENCOUNTER — Other Ambulatory Visit: Payer: Self-pay

## 2020-11-24 DIAGNOSIS — I6529 Occlusion and stenosis of unspecified carotid artery: Secondary | ICD-10-CM

## 2020-11-24 DIAGNOSIS — I6523 Occlusion and stenosis of bilateral carotid arteries: Secondary | ICD-10-CM | POA: Diagnosis not present

## 2020-11-26 ENCOUNTER — Other Ambulatory Visit: Payer: Self-pay

## 2020-11-26 DIAGNOSIS — I6529 Occlusion and stenosis of unspecified carotid artery: Secondary | ICD-10-CM

## 2020-11-26 DIAGNOSIS — I6523 Occlusion and stenosis of bilateral carotid arteries: Secondary | ICD-10-CM

## 2020-11-29 ENCOUNTER — Other Ambulatory Visit: Payer: Self-pay | Admitting: Physician Assistant

## 2020-11-29 DIAGNOSIS — Z952 Presence of prosthetic heart valve: Secondary | ICD-10-CM

## 2020-11-30 ENCOUNTER — Other Ambulatory Visit: Payer: Self-pay | Admitting: *Deleted

## 2020-11-30 MED ORDER — APIXABAN 5 MG PO TABS: 5.0000 mg | ORAL_TABLET | Freq: Two times a day (BID) | ORAL | 0 refills | Status: DC

## 2020-12-12 ENCOUNTER — Other Ambulatory Visit: Payer: Self-pay | Admitting: Family

## 2021-01-01 ENCOUNTER — Other Ambulatory Visit: Payer: Self-pay | Admitting: Family

## 2021-01-05 ENCOUNTER — Other Ambulatory Visit: Payer: Self-pay | Admitting: Family

## 2021-01-18 NOTE — Progress Notes (Deleted)
[facility] [place]                                       Cardiology Office Note    Date:  01/19/2021   ID:  Matthew May, DOB [birth year], MRN [id number]  PCP:  Matthew Balloon, FNP  Cardiologist:  Dr. Michelle Piper / Dr. Burt Knack & Dr. Cyndia Bent (TAVR)  CC: 1 year s/p TAVR  History of Present Illness:  Matthew May is a 75 y.o. 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   He has a history of coronary artery disease status post coronary artery bypass graft surgery x4 by Dr. Cyndia Bent in 2011 after a NSTEMI. He had a LIMA--> LAD, SVG--> OM, and sequential SVG-->PDA and PLA placed.  He has done well since his surgery but developed progressive exertional fatigue, shortness of breath, and chest discomfort last year. He was found to have severe AS. [facility] 12/30/19 showed occluded SVG-> LCx, patent sequential SVG -->PDA and PL, widely patent LIMA -->LAD, widely patent native left main. There was a patent LAD with competitive flow into the distal LAD from LIMA and severe diffuse disease in the mid to distal circumflex with functional total occlusion and collateralization of the second obtuse marginal from right to left collaterals. There was total occlusion mid RCA.   He was evaluated by the multidisciplinary valve team and underwent successful TAVR with a 29 mm Edwards Sapien 3 THV via the TF approach on 01/20/20. Post operative echo showed EF 60-65%, normally functioning TAVR with a mean gradient of 7 mmHg and no PVL. He was discharged home on Eliquis with the addition of a baby aspirin. At one week follow up ECG showed new wide LBBB ( qrs 130m), 1st deg Av block (PR more prolonged) at 2943m. He was having some  dizziness so a Zio patch was placed which did not show any arrhythmias. 1 month echo showed EF 60%, normally functioning TAVR with a mean gradient of 7 mm hg and no PVL. He was seen by Dr. HoPercival Spanishost recently in June and doing well.   Today he presents to clinic for follow up.  Past Medical History:  Diagnosis Date   Atrial fibrillation ([facility]   CAD (coronary artery disease)    a. s/p NSTEMI 4/11 => s/p CABG (L-LAD, S-OM2, S-PDA/PL);  b.  ETT-Myoview 6/14:  normal study, no ischemia, EF 67%   Carotid stenosis    Carotid U/S [id number] RICA [id number] vertebral flow retrograde suggestive of steel-consider PV consult   Cataract    COPD (chronic obstructive pulmonary disease) (HCC)    Diabetes mellitus without complication ([facility]   HLD (hyperlipidemia)    HTN (hypertension)    Obesity    Renal insufficiency    Severe aortic stenosis    Subclavian artery stenosis, right ([facility]   based upon carotid U/S done 11/2012    Past Surgical History:  Procedure Laterality Date   CATARACT EXTRACTION Left    CORONARY ARTERY BYPASS GRAFT     2011, LIMA to LAD coronary artery, SVG to OM2 branch  of lect circumflex coronary artery, and a sequential SVG to psot descening to posterolateral branches to RCA   endoscopic vein harvesting     right leg    HERNIA REPAIR     RIGHT/LEFT HEART CATH AND CORONARY/GRAFT ANGIOGRAPHY N/A 12/31/2017   Procedure: RIGHT/LEFT HEART CATH AND CORONARY/GRAFT ANGIOGRAPHY;  Surgeon: Leonie Man, MD;  Location: [facility];  Service: Cardiovascular;  Laterality: N/A;   RIGHT/LEFT HEART CATH AND CORONARY/GRAFT ANGIOGRAPHY N/A 12/30/2019   Procedure: RIGHT/LEFT HEART CATH AND CORONARY/GRAFT ANGIOGRAPHY;  Surgeon: Belva Crome, MD;  Location: [facility];  Service: Cardiovascular;  Laterality: N/A;   TEE WITHOUT CARDIOVERSION N/A 01/20/2020   Procedure: TRANSESOPHAGEAL ECHOCARDIOGRAM (TEE);  Surgeon: [name], MD;  Location: [place];  Service:  Open Heart Surgery;  Laterality: N/A;   TONSILLECTOMY     TRANSCATHETER AORTIC VALVE REPLACEMENT, TRANSFEMORAL N/A 01/20/2020   Procedure: TRANSCATHETER AORTIC VALVE REPLACEMENT, TRANSFEMORALUSING EDWARDS SAPIEN 3 29 MM AORTIC THV.;  Surgeon: [name], MD;  Location: [place];  Service: Open Heart Surgery;  Laterality: N/A;    Current Medications: Outpatient Medications Prior to Visit  Medication Sig Dispense Refill   acetaminophen (TYLENOL) 325 MG tablet Take 2 tablets (650 mg total) by mouth every 6 (six) hours as needed for mild pain (or Fever >/= 101).     albuterol (PROVENTIL HFA;VENTOLIN HFA) 108 (90 Base) MCG/ACT inhaler Inhale 2 puffs into the lungs every 6 (six) hours as needed for wheezing or shortness of breath. 1 Inhaler 0   atorvastatin (LIPITOR) 40 MG tablet TAKE 1 TABLET BY MOUTH  DAILY 90 tablet 0   BREO ELLIPTA 100-25 MCG/INH AEPB Inhale 1 puff by mouth once daily 60 each 2   cholecalciferol (VITAMIN D3) 25 MCG (1000 UT) tablet Take 1,000 Units by mouth daily.     colchicine 0.6 MG tablet TAKE 2 TABLETS BY MOUTH NOW, THEN TAKE AN ADDITIONAL TABLET IN 1 HOUR. TAKE 1 TABLET ONCE DAILY THEREAFTER UNTIL SYMPTOMS RESOLVE 30 tablet 0   dapagliflozin propanediol (FARXIGA) 10 MG TABS tablet Take 10 mg by mouth daily.     ELIQUIS 5 MG TABS tablet Take 1 tablet by mouth twice daily 60 tablet 1   glimepiride (AMARYL) 2 MG tablet TAKE 1 TABLET BY MOUTH  DAILY WITH BREAKFAST 90 tablet 0   glucose blood (ONETOUCH VERIO) test strip TEST BLOOD SUGAR DAILY 100 strip 3   Lancets (ONETOUCH DELICA PLUS 123XX123) MISC Check BS daily Dx E11.9 100 each 3   levothyroxine (SYNTHROID) 150 MCG tablet TAKE 1 TABLET BY MOUTH  DAILY 90 tablet 3   metFORMIN (GLUCOPHAGE) 1000 MG tablet TAKE 1 TABLET BY MOUTH  TWICE DAILY 180 tablet 0   Misc Natural Products (TART CHERRY ADVANCED PO) Take 1 tablet by mouth daily.      mupirocin ointment (BACTROBAN) 2 % Apply topically 2 (two) times daily. 22 g 0    nitroGLYCERIN (NITROSTAT) 0.4 MG SL tablet DISSOLVE ONE TABLET UNDER THE TONGUE EVERY 5 MINUTES AS NEEDED FOR CHEST PAIN.  DO NOT EXCEED A TOTAL OF 3 DOSES IN 15 MINUTES 25 tablet 0   potassium chloride SA (KLOR-CON) 20 MEQ tablet TAKE 1 TABLET BY MOUTH  DAILY 90 tablet 3   torsemide (DEMADEX) 20 MG tablet TAKE 2 TABLETS BY MOUTH  DAILY 180 tablet 0   valsartan (DIOVAN) 40 MG tablet Take 1 tablet (40 mg total) by mouth daily. 90 tablet 3   No facility-administered medications prior to  visit.     Allergies:   Prednisone, Januvia [sitagliptin], Jardiance [empagliflozin], and Lisinopril   Social History   Socioeconomic History   Marital status: Married    Spouse name: Lucita Ferrara   Number of children: 3   Years of education: 8   Highest education level: 8th grade  Occupational History   Occupation: Retired    Comment: scrap yard  Tobacco Use   Smoking status: Former    Types: Cigarettes    Quit date: 07/11/2009    Years since quitting: 11.5   Smokeless tobacco: Never   Tobacco comments:    smoked about 10 cig/day; used to smoke 2 ppd for many years   Vaping Use   Vaping Use: Never used  Substance and Sexual Activity   Alcohol use: No    Alcohol/week: 0.0 standard drinks   Drug use: No   Sexual activity: Never  Other Topics Concern   Not on file  Social History Narrative   Lives at home with his wife. He is retired but his wife continues to work daily. They have adult children that live locally and grandchildren that they spend time with regularly. He is active around his home and yard.    Social Determinants of Health   Financial Resource Strain: Not on file  Food Insecurity: Not on file  Transportation Needs: Not on file  Physical Activity: Not on file  Stress: Not on file  Social Connections: Not on file     Family History:  The patient's family history includes Depression in his maternal aunt; Diabetes in his brother; Heart attack in his father; Heart disease in his father;  Mental illness in his mother and sister.     ROS:   Please see the history of present illness.    ROS All other systems reviewed and are negative.   PHYSICAL EXAM:   VS:  There were no vitals taken for this visit.   GEN: Well nourished, well developed, in no acute distress, obese HEENT: normal Neck: no JVD or masses Cardiac: RRR; no murmurs, rubs, or gallops,no edema  Respiratory:  clear to auscultation bilaterally, normal work of breathing GI: soft, nontender, nondistended, + BS MS: no deformity or atrophy Skin: warm and dry, no rash.   Neuro:  Alert and Oriented x 3, Strength and sensation are intact Psych: euthymic mood, full affect   Wt Readings from Last 3 Encounters:  11/11/20 248 lb 6.4 oz (112.7 kg)  11/10/20 245 lb (111.1 kg)  08/10/20 246 lb 12.8 oz (111.9 kg)      Studies/Labs Reviewed:   EKG:  EKG is NOT ordered today.   Recent Labs: 01/21/2020: Magnesium 1.9 05/11/2020: TSH 2.170 11/11/2020: ALT 48; BUN 13; Creatinine, Ser 0.87; Hemoglobin 13.1; Platelets 139; Potassium 4.2; Sodium 136   Lipid Panel    Component Value Date/Time   CHOL 129 11/11/2020 1044   CHOL 138 12/26/2012 1310   TRIG 147 11/11/2020 1044   TRIG 90 05/23/2013 1452   TRIG 108 12/26/2012 1310   HDL 26 (L) 11/11/2020 1044   HDL 34 (L) 05/23/2013 1452   HDL 33 (L) 12/26/2012 1310   CHOLHDL 5.0 11/11/2020 1044   CHOLHDL 5.9 10/01/2009 0700   VLDL 16 10/01/2009 0700   LDLCALC 77 11/11/2020 1044   LDLCALC 81 05/23/2013 1452   LDLCALC 83 12/26/2012 1310    Additional studies/ records that were reviewed today include:  TAVR OPERATIVE NOTE     Date of Procedure:  01/20/2020   Preoperative Diagnosis:      Severe Aortic Stenosis    Postoperative Diagnosis:    Same    Procedure:        Transcatheter Aortic Valve Replacement - Percutaneous Left Transfemoral Approach             Edwards Sapien 3 THV (size 29 mm, model # 9600TFX, serial # [id number])               Co-Surgeons:                        [name], MD and [name], MD    Anesthesiologist:                  Wilfrid Lund, MD   Echocardiographer:              Edmonia James, MD   Pre-operative Echo Findings: Severe aortic stenosis Normal left ventricular systolic function   Post-operative Echo Findings: No paravalvular leak Normal left ventricular systolic function  _____________________  Echo 01/21/20:  IMPRESSIONS  1. Left ventricular ejection fraction, by estimation, is 60 to 65%. The left ventricle has normal function. The left ventricle has no regional wall motion abnormalities. There is severe left ventricular hypertrophy.  Left ventricular diastolic parameters   are consistent with Grade II diastolic dysfunction (pseudonormalization).  Elevated left ventricular end-diastolic pressure.   2. Right ventricular systolic function is normal. The right ventricular  size is normal.   3. Left atrial size was severely dilated.   4. The mitral valve is normal in structure. Mild mitral valve  regurgitation. No evidence of mitral stenosis.   5. Post op day one TAVR with 29 mm Sapien 3 valve no significant PvL Peak  velocity 1.9 m/sec mean gradient 7 peak 14.5 mmHg AVA 1.6 cm2. The aortic  valve has been repaired/replaced. Aortic valve regurgitation is not  visualized. No aortic stenosis is  present. There is a 29 mm Edwards Sapien prosthetic (TAVR) valve present  in the aortic position. Procedure Date: 01/20/2020.   6. The inferior vena cava is normal in size with greater than 50%  respiratory variability, suggesting right atrial pressure of 3 mmHg.   ____________________  Echo 02/18/20 IMPRESSIONS      1. Left ventricular ejection fraction, by estimation, is 55 to 60%. The left ventricle has normal function. The left ventricle has no regional wall motion abnormalities. There is moderate left ventricular hypertrophy. Left ventricular diastolic  parameters are consistent with  Grade I diastolic dysfunction (impaired relaxation). Elevated left ventricular end-diastolic pressure. The E/e' is 29.  2. Right ventricular systolic function is normal. The right ventricular size is normal. There is normal pulmonary artery systolic pressure.  3. Left atrial size was mildly dilated.  4. The pericardial effusion is posterior to the left ventricle.  5. The mitral valve is abnormal. Trivial mitral valve regurgitation.  6. The aortic valve has been repaired/replaced. Aortic valve regurgitation is not visualized. There is a 29 mm Sapien prosthetic (TAVR) valve present in the aortic position. Echo findings are consistent with normal structure and function of the aortic  valve prosthesis. Aortic valve area, by VTI measures 1.71 cm. Aortic valve mean gradient measures 8.0 mmHg. Aortic valve Vmax measures 1.94 m/s.  7. The inferior vena cava is normal in size with greater than 50% respiratory variability, suggesting right atrial pressure of 3 mmHg.   Comparison(s): Changes from prior study are noted. 01/21/2020: LVEF  60-65%, severe LVH, grade 2 DD, severe LAE, Sapien 3 THV -mean gradient 7 mmHg.   _____________________  Elwyn Reach patch 8-02/2020 Study Highlights  Normal sinus rhythm. Runs of supraventricular tachycardia First degree AV block Sinus tachycardia Brief runs of idioventricular rhythm Ventricular bigeminy.  ______________________  Echo 01/19/2021 ***  ASSESSMENT & PLAN:   Severe AS s/p TAVR:   HTN:   Dizziness: chronic   Carotid artery disease: bilateral 50-59% carotid stenosis. Repeat dopplers due 11/2021  Medication Adjustments/Labs and Tests Ordered: Current medicines are reviewed at length with the patient today.  Concerns regarding medicines are outlined above.  Medication changes, Labs and Tests ordered today are listed in the Patient Instructions below. There are no Patient Instructions on file for this visit.    Signed, Angelena Form, PA-C  01/19/2021  1:37 PM    [facility] [place], [place], [place]  [address] Phone: [phone]; Fax: [phone]

## 2021-01-19 ENCOUNTER — Ambulatory Visit (HOSPITAL_COMMUNITY): Payer: Medicare Other | Attending: Cardiology

## 2021-01-19 ENCOUNTER — Ambulatory Visit: Payer: Medicare Other | Admitting: Physician Assistant

## 2021-01-19 ENCOUNTER — Encounter (HOSPITAL_COMMUNITY): Payer: Self-pay | Admitting: Physician Assistant

## 2021-01-19 DIAGNOSIS — Z952 Presence of prosthetic heart valve: Secondary | ICD-10-CM

## 2021-01-19 DIAGNOSIS — I6523 Occlusion and stenosis of bilateral carotid arteries: Secondary | ICD-10-CM

## 2021-01-19 DIAGNOSIS — R42 Dizziness and giddiness: Secondary | ICD-10-CM

## 2021-01-19 DIAGNOSIS — I10 Essential (primary) hypertension: Secondary | ICD-10-CM

## 2021-01-23 ENCOUNTER — Other Ambulatory Visit: Payer: Self-pay | Admitting: Family

## 2021-01-23 DIAGNOSIS — E1159 Type 2 diabetes mellitus with other circulatory complications: Secondary | ICD-10-CM

## 2021-01-23 DIAGNOSIS — R609 Edema, unspecified: Secondary | ICD-10-CM

## 2021-01-31 ENCOUNTER — Ambulatory Visit (HOSPITAL_COMMUNITY): Payer: Medicare Other | Attending: Cardiovascular Disease

## 2021-01-31 ENCOUNTER — Other Ambulatory Visit: Payer: Self-pay

## 2021-01-31 DIAGNOSIS — Z952 Presence of prosthetic heart valve: Secondary | ICD-10-CM | POA: Insufficient documentation

## 2021-01-31 LAB — ECHOCARDIOGRAM COMPLETE
AV Mean grad: 10 mmHg
AV Peak grad: 18.1 mmHg
Ao pk vel: 2.13 m/s
Area-P 1/2: 2.03 cm2
S' Lateral: 2.8 cm

## 2021-01-31 MED ORDER — PERFLUTREN LIPID MICROSPHERE
1.0000 mL | INTRAVENOUS | Status: AC | PRN
Start: 2021-01-31 — End: 2021-01-31
  Administered 2021-01-31: 3 mL via INTRAVENOUS

## 2021-02-10 ENCOUNTER — Ambulatory Visit (INDEPENDENT_AMBULATORY_CARE_PROVIDER_SITE_OTHER): Payer: Medicare Other | Admitting: Physician Assistant

## 2021-02-10 ENCOUNTER — Other Ambulatory Visit: Payer: Self-pay

## 2021-02-10 ENCOUNTER — Encounter: Payer: Self-pay | Admitting: Physician Assistant

## 2021-02-10 VITALS — BP 138/62 | HR 81 | Ht 67.5 in | Wt 240.0 lb

## 2021-02-10 DIAGNOSIS — I6529 Occlusion and stenosis of unspecified carotid artery: Secondary | ICD-10-CM

## 2021-02-10 DIAGNOSIS — I6523 Occlusion and stenosis of bilateral carotid arteries: Secondary | ICD-10-CM | POA: Diagnosis not present

## 2021-02-10 DIAGNOSIS — Z952 Presence of prosthetic heart valve: Secondary | ICD-10-CM

## 2021-02-10 DIAGNOSIS — I10 Essential (primary) hypertension: Secondary | ICD-10-CM | POA: Diagnosis not present

## 2021-02-10 DIAGNOSIS — R42 Dizziness and giddiness: Secondary | ICD-10-CM | POA: Diagnosis not present

## 2021-02-10 NOTE — Progress Notes (Signed)
[facility] [place]                                     Cardiology Office Note:    Date:  02/10/2021   ID:  Matthew May, DOB [birth year], MRN [id number]  PCP:  Sharion Balloon, FNP  [facility] Cardiologist:  Minus Breeding, MD  / Dr. Burt Knack & Dr. Cyndia Bent (TAVR) [place] Electrophysiologist:  None   Referring MD: Sharion Balloon, FNP   1 year s/p TAVR  History of Present Illness:    Matthew May is a 75 y.o. 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   He has a history of coronary artery disease status post coronary artery bypass graft surgery x4 by Dr. Cyndia Bent in 2011 after a NSTEMI. He had a LIMA--> LAD, SVG--> OM, and sequential SVG-->PDA and PLA placed.  He has done well since his surgery but developed progressive exertional fatigue, shortness of breath, and chest discomfort last year. He was found to have severe AS. [facility] 12/30/19 showed occluded SVG-> LCx, patent sequential SVG -->PDA and PL, widely patent LIMA -->LAD, widely patent native left main. There was a patent LAD with competitive flow into the distal LAD from LIMA and severe diffuse disease in the mid to distal circumflex with functional total occlusion and collateralization of the second obtuse marginal from right to left collaterals. There was total occlusion mid RCA.   He was evaluated by the multidisciplinary valve team and underwent successful TAVR with a 29 mm Edwards Sapien 3 THV via the TF approach on 01/20/20. Post operative echo showed EF 60-65%, normally functioning TAVR with a mean gradient of 7 mmHg and no PVL. He was discharged home on Eliquis with the addition of a baby aspirin. At one week follow up ECG showed new  wide LBBB ( qrs 173m), 1st deg Av block (PR more prolonged) at 2958m. He was having some dizziness so a Zio patch was placed which did not show any arrhythmias. 1 month echo showed EF 60%, normally functioning TAVR with a mean gradient of 7 mm hg and no PVL. He was seen by Dr. HoPercival Spanishost recently in June and doing well. Echo 01/31/21 showed EF 60%, normally functioning TAVR with a mean gradient of 10 mmHg and no PVL.  Today he presents to clinic for follow up. Doing well. Here with daughter. Tries to stay acitve mowing the yard and weed eating. Has to take rests from time to time and sits under tree. Chronic LE edema and orthopnea. Occasional chest pain that imrpoves with tylenol.   Past Medical History:  Diagnosis Date   Atrial fibrillation ([place]   CAD (coronary artery disease)    a. s/p NSTEMI 4/11 => s/p CABG (L-LAD, S-OM2, S-PDA/PL);  b.  ETT-Myoview 6/14:  normal study, no ischemia, EF 67%   Carotid stenosis    Carotid U/S [id number] RICA [id number] vertebral flow retrograde suggestive of steel-consider PV consult   Cataract    COPD (chronic obstructive pulmonary disease) (HCC)    Diabetes mellitus without complication ([place]   HLD (  hyperlipidemia)    HTN (hypertension)    Obesity    Renal insufficiency    Severe aortic stenosis    Subclavian artery stenosis, right ([place])    based upon carotid U/S done 11/2012    Past Surgical History:  Procedure Laterality Date   CATARACT EXTRACTION Left    CORONARY ARTERY BYPASS GRAFT     2011, LIMA to LAD coronary artery, SVG to OM2 branch of lect circumflex coronary artery, and a sequential SVG to psot descening to posterolateral branches to RCA   endoscopic vein harvesting     right leg    HERNIA REPAIR     RIGHT/LEFT HEART CATH AND CORONARY/GRAFT ANGIOGRAPHY N/A 12/31/2017   Procedure: RIGHT/LEFT HEART CATH AND CORONARY/GRAFT ANGIOGRAPHY;  Surgeon: Leonie Man, MD;  Location: [place] [facility];  Service: Cardiovascular;   Laterality: N/A;   RIGHT/LEFT HEART CATH AND CORONARY/GRAFT ANGIOGRAPHY N/A 12/30/2019   Procedure: RIGHT/LEFT HEART CATH AND CORONARY/GRAFT ANGIOGRAPHY;  Surgeon: Belva Crome, MD;  Location: [place] [facility];  Service: Cardiovascular;  Laterality: N/A;   TEE WITHOUT CARDIOVERSION N/A 01/20/2020   Procedure: TRANSESOPHAGEAL ECHOCARDIOGRAM (TEE);  Surgeon: [name], MD;  Location: [place];  Service: Open Heart Surgery;  Laterality: N/A;   TONSILLECTOMY     TRANSCATHETER AORTIC VALVE REPLACEMENT, TRANSFEMORAL N/A 01/20/2020   Procedure: TRANSCATHETER AORTIC VALVE REPLACEMENT, TRANSFEMORALUSING EDWARDS SAPIEN 3 29 MM AORTIC THV.;  Surgeon: [name], MD;  Location: [place];  Service: Open Heart Surgery;  Laterality: N/A;    Current Medications: Current Meds  Medication Sig   acetaminophen (TYLENOL) 325 MG tablet Take 2 tablets (650 mg total) by mouth every 6 (six) hours as needed for mild pain (or Fever >/= 101).   atorvastatin (LIPITOR) 40 MG tablet TAKE 1 TABLET BY MOUTH  DAILY   BREO ELLIPTA 100-25 MCG/INH AEPB Inhale 1 puff by mouth once daily   cholecalciferol (VITAMIN D3) 25 MCG (1000 UT) tablet Take 1,000 Units by mouth daily.   colchicine 0.6 MG tablet TAKE 2 TABLETS BY MOUTH NOW, THEN TAKE AN ADDITIONAL TABLET IN 1 HOUR. TAKE 1 TABLET ONCE DAILY THEREAFTER UNTIL SYMPTOMS RESOLVE   dapagliflozin propanediol (FARXIGA) 10 MG TABS tablet Take 10 mg by mouth daily.   ELIQUIS 5 MG TABS tablet Take 1 tablet by mouth twice daily   glimepiride (AMARYL) 2 MG tablet TAKE 1 TABLET BY MOUTH  DAILY WITH BREAKFAST   glucose blood (ONETOUCH VERIO) test strip TEST BLOOD SUGAR DAILY   Lancets (ONETOUCH DELICA PLUS 123XX123) MISC Check BS daily Dx E11.9   levothyroxine (SYNTHROID) 150 MCG tablet TAKE 1 TABLET BY MOUTH  DAILY   metFORMIN (GLUCOPHAGE) 1000 MG tablet TAKE 1 TABLET BY MOUTH  TWICE DAILY   nitroGLYCERIN (NITROSTAT) 0.4 MG SL tablet DISSOLVE ONE TABLET UNDER THE TONGUE EVERY 5  MINUTES AS NEEDED FOR CHEST PAIN.  DO NOT EXCEED A TOTAL OF 3 DOSES IN 15 MINUTES   potassium chloride SA (KLOR-CON) 20 MEQ tablet TAKE 1 TABLET BY MOUTH  DAILY   torsemide (DEMADEX) 20 MG tablet TAKE 2 TABLETS BY MOUTH  DAILY   valsartan (DIOVAN) 40 MG tablet Take 1 tablet (40 mg total) by mouth daily.   [DISCONTINUED] albuterol (PROVENTIL HFA;VENTOLIN HFA) 108 (90 Base) MCG/ACT inhaler Inhale 2 puffs into the lungs every 6 (six) hours as needed for wheezing or shortness of breath.   [DISCONTINUED] Misc Natural Products (TART CHERRY ADVANCED PO) Take 1 tablet by mouth daily.    [  DISCONTINUED] mupirocin ointment (BACTROBAN) 2 % Apply topically 2 (two) times daily.     Allergies:   Prednisone, Januvia [sitagliptin], Jardiance [empagliflozin], and Lisinopril   Social History   Socioeconomic History   Marital status: Married    Spouse name: Lucita Ferrara   Number of children: 3   Years of education: 8   Highest education level: 8th grade  Occupational History   Occupation: Retired    Comment: scrap yard  Tobacco Use   Smoking status: Former    Types: Cigarettes    Quit date: 07/11/2009    Years since quitting: 11.5   Smokeless tobacco: Never   Tobacco comments:    smoked about 10 cig/day; used to smoke 2 ppd for many years   Vaping Use   Vaping Use: Never used  Substance and Sexual Activity   Alcohol use: No    Alcohol/week: 0.0 standard drinks   Drug use: No   Sexual activity: Never  Other Topics Concern   Not on file  Social History Narrative   Lives at home with his wife. He is retired but his wife continues to work daily. They have adult children that live locally and grandchildren that they spend time with regularly. He is active around his home and yard.    Social Determinants of Health   Financial Resource Strain: Not on file  Food Insecurity: Not on file  Transportation Needs: Not on file  Physical Activity: Not on file  Stress: Not on file  Social Connections: Not on file      Family History: The patient's family history includes Depression in his maternal aunt; Diabetes in his brother; Heart attack in his father; Heart disease in his father; Mental illness in his mother and sister.  ROS:   Please see the history of present illness.    All other systems reviewed and are negative.  EKGs/Labs/Other Studies Reviewed:    The following studies were reviewed today:  TAVR OPERATIVE NOTE     Date of Procedure:                01/20/2020   Preoperative Diagnosis:      Severe Aortic Stenosis    Postoperative Diagnosis:    Same    Procedure:        Transcatheter Aortic Valve Replacement - Percutaneous Left Transfemoral Approach             Edwards Sapien 3 THV (size 29 mm, model # 9600TFX, serial # [id number])              Co-Surgeons:                        [name], MD and [name], MD    Anesthesiologist:                  Wilfrid Lund, MD   Echocardiographer:              Edmonia James, MD   Pre-operative Echo Findings: Severe aortic stenosis Normal left ventricular systolic function   Post-operative Echo Findings: No paravalvular leak Normal left ventricular systolic function   _____________________   Echo 01/21/20:  IMPRESSIONS  1. Left ventricular ejection fraction, by estimation, is 60 to 65%. The left ventricle has normal function. The left ventricle has no regional wall motion abnormalities. There is severe left ventricular hypertrophy.  Left ventricular diastolic parameters   are consistent with Grade II diastolic dysfunction (pseudonormalization).  Elevated  left ventricular end-diastolic pressure.   2. Right ventricular systolic function is normal. The right ventricular  size is normal.   3. Left atrial size was severely dilated.   4. The mitral valve is normal in structure. Mild mitral valve  regurgitation. No evidence of mitral stenosis.   5. Post op day one TAVR with 29 mm Sapien 3 valve no significant PvL Peak  velocity 1.9  m/sec mean gradient 7 peak 14.5 mmHg AVA 1.6 cm2. The aortic  valve has been repaired/replaced. Aortic valve regurgitation is not  visualized. No aortic stenosis is  present. There is a 29 mm Edwards Sapien prosthetic (TAVR) valve present  in the aortic position. Procedure Date: 01/20/2020.   6. The inferior vena cava is normal in size with greater than 50%  respiratory variability, suggesting right atrial pressure of 3 mmHg.    ____________________   Echo 02/18/20 IMPRESSIONS  1. Left ventricular ejection fraction, by estimation, is 55 to 60%. The left ventricle has normal function. The left ventricle has no regional wall motion abnormalities. There is moderate left ventricular hypertrophy. Left ventricular diastolic  parameters are consistent with Grade I diastolic dysfunction (impaired relaxation). Elevated left ventricular end-diastolic pressure. The E/e' is 96.  2. Right ventricular systolic function is normal. The right ventricular size is normal. There is normal pulmonary artery systolic pressure.  3. Left atrial size was mildly dilated.  4. The pericardial effusion is posterior to the left ventricle.  5. The mitral valve is abnormal. Trivial mitral valve regurgitation.  6. The aortic valve has been repaired/replaced. Aortic valve regurgitation is not visualized. There is a 29 mm Sapien prosthetic (TAVR) valve present in the aortic position. Echo findings are consistent with normal structure and function of the aortic  valve prosthesis. Aortic valve area, by VTI measures 1.71 cm. Aortic valve mean gradient measures 8.0 mmHg. Aortic valve Vmax measures 1.94 m/s.  7. The inferior vena cava is normal in size with greater than 50% respiratory variability, suggesting right atrial pressure of 3 mmHg.   Comparison(s): Changes from prior study are noted. 01/21/2020: LVEF 60-65%, severe LVH, grade 2 DD, severe LAE, Sapien 3 THV -mean gradient 7 mmHg.   ___________________  Echo  01/28/21 IMPRESSIONS   1. Left ventricular ejection fraction, by estimation, is 60 to 65%. The  left ventricle has normal function. The left ventricle has no regional  wall motion abnormalities. There is mild concentric left ventricular  hypertrophy. Left ventricular diastolic  parameters are consistent with Grade I diastolic dysfunction (impaired  relaxation). Elevated left ventricular end-diastolic pressure.   2. Right ventricular systolic function is normal. The right ventricular  size is normal.   3. The mitral valve is normal in structure. No evidence of mitral valve  regurgitation. No evidence of mitral stenosis.   4. The aortic valve has been repaired/replaced. Aortic valve  regurgitation is not visualized. No aortic stenosis is present. Echo  findings are consistent with normal structure and function of the aortic  valve prosthesis.   5. Aortic dilatation noted. There is mild dilatation of the ascending  aorta, measuring 39 mm.   6. The inferior vena cava is normal in size with greater than 50%  respiratory variability, suggesting right atrial pressure of 3 mmHg.    EKG:  EKG is NOT ordered today.  Recent Labs: 05/11/2020: TSH 2.170 11/11/2020: ALT 48; BUN 13; Creatinine, Ser 0.87; Hemoglobin 13.1; Platelets 139; Potassium 4.2; Sodium 136  Recent Lipid Panel    Component Value Date/Time  CHOL 129 11/11/2020 1044   CHOL 138 12/26/2012 1310   TRIG 147 11/11/2020 1044   TRIG 90 05/23/2013 1452   TRIG 108 12/26/2012 1310   HDL 26 (L) 11/11/2020 1044   HDL 34 (L) 05/23/2013 1452   HDL 33 (L) 12/26/2012 1310   CHOLHDL 5.0 11/11/2020 1044   CHOLHDL 5.9 10/01/2009 0700   VLDL 16 10/01/2009 0700   LDLCALC 77 11/11/2020 1044   LDLCALC 81 05/23/2013 1452   LDLCALC 83 12/26/2012 1310     Risk Assessment/Calculations:       Physical Exam:    VS:  BP 138/62   Pulse 81   Ht 5' 7.5" (1.715 m)   Wt 240 lb (108.9 kg)   SpO2 97%   BMI 37.03 kg/m     Wt Readings from  Last 3 Encounters:  02/10/21 240 lb (108.9 kg)  11/11/20 248 lb 6.4 oz (112.7 kg)  11/10/20 245 lb (111.1 kg)     GEN:  Well nourished, well developed in no acute distress HEENT: Normal NECK: No JVD LYMPHATICS: No lymphadenopathy CARDIAC: RRR, no murmurs, rubs, gallops RESPIRATORY:  Clear to auscultation without rales, wheezing or rhonchi  ABDOMEN: Soft, non-tender, non-distended MUSCULOSKELETAL:  Chronic LE edema; No deformity  SKIN: Warm and dry. NEUROLOGIC:  Alert and oriented x 3 PSYCHIATRIC:  Normal affect   ASSESSMENT:    1. S/P TAVR (transcatheter aortic valve replacement)   2. Essential hypertension   3. Dizziness   4. Stenosis of carotid artery, unspecified laterality   5. Bilateral carotid artery stenosis    PLAN:    In order of problems listed above:  Severe AS s/p TAVR: doing well 1 year out from TAVR. Echo 01/31/21 showed EF 60%, normally functioning TAVR with a mean gradient of 10 mmHg and no PVL. He has NYHA II symptoms. Continue on Eliquis '5mg'$  BID.   HTN: BP well controlled. No changes made  Dizziness: chronic but better since stopping aspirin. Likely vestibular  Carotid artery disease: bilateral 50-69% carotid stenosis. Repeat dopplers due 11/2021    Medication Adjustments/Labs and Tests Ordered: Current medicines are reviewed at length with the patient today.  Concerns regarding medicines are outlined above.  No orders of the defined types were placed in this encounter.  No orders of the defined types were placed in this encounter.   Patient Instructions  Medication Instructions:  No changes *If you need a refill on your cardiac medications before your next appointment, please call your pharmacy*   Lab Work: none If you have labs (blood work) drawn today and your tests are completely normal, you will receive your results only by: [place] (if you have MyChart) OR A paper copy in the mail If you have any lab test that is abnormal or we  need to change your treatment, we will call you to review the results.   Testing/Procedures: none   Follow-Up: As planned     Signed, Angelena Form, PA-C  02/10/2021 3:22 PM    [facility]

## 2021-02-10 NOTE — Patient Instructions (Signed)
Medication Instructions:  No changes *If you need a refill on your cardiac medications before your next appointment, please call your pharmacy*   Lab Work: none If you have labs (blood work) drawn today and your tests are completely normal, you will receive your results only by: [place] (if you have MyChart) OR A paper copy in the mail If you have any lab test that is abnormal or we need to change your treatment, we will call you to review the results.   Testing/Procedures: none   Follow-Up: As planned

## 2021-03-01 ENCOUNTER — Other Ambulatory Visit: Payer: Self-pay | Admitting: Family

## 2021-03-02 ENCOUNTER — Telehealth: Payer: Self-pay | Admitting: Family

## 2021-03-14 ENCOUNTER — Other Ambulatory Visit: Payer: Self-pay

## 2021-03-14 ENCOUNTER — Ambulatory Visit (INDEPENDENT_AMBULATORY_CARE_PROVIDER_SITE_OTHER): Payer: Medicare Other | Admitting: Family

## 2021-03-14 ENCOUNTER — Encounter: Payer: Self-pay | Admitting: Family

## 2021-03-14 VITALS — BP 150/70 | HR 87 | Temp 97.6°F | Ht 67.0 in | Wt 244.0 lb

## 2021-03-14 DIAGNOSIS — I2510 Atherosclerotic heart disease of native coronary artery without angina pectoris: Secondary | ICD-10-CM | POA: Diagnosis not present

## 2021-03-14 DIAGNOSIS — J449 Chronic obstructive pulmonary disease, unspecified: Secondary | ICD-10-CM | POA: Diagnosis not present

## 2021-03-14 DIAGNOSIS — I10 Essential (primary) hypertension: Secondary | ICD-10-CM

## 2021-03-14 DIAGNOSIS — E1165 Type 2 diabetes mellitus with hyperglycemia: Secondary | ICD-10-CM

## 2021-03-14 DIAGNOSIS — E785 Hyperlipidemia, unspecified: Secondary | ICD-10-CM | POA: Diagnosis not present

## 2021-03-14 DIAGNOSIS — E038 Other specified hypothyroidism: Secondary | ICD-10-CM

## 2021-03-14 DIAGNOSIS — Z23 Encounter for immunization: Secondary | ICD-10-CM | POA: Diagnosis not present

## 2021-03-14 LAB — CMP14+EGFR
ALT: 35 IU/L (ref 0–44)
AST: 23 IU/L (ref 0–40)
Albumin/Globulin Ratio: 1.8 (ref 1.2–2.2)
Albumin: 4.4 g/dL (ref 3.7–4.7)
Alkaline Phosphatase: 86 IU/L (ref 44–121)
BUN/Creatinine Ratio: 17 (ref 10–24)
BUN: 15 mg/dL (ref 8–27)
Bilirubin Total: 0.3 mg/dL (ref 0.0–1.2)
CO2: 22 mmol/L (ref 20–29)
Calcium: 9.3 mg/dL (ref 8.6–10.2)
Chloride: 100 mmol/L (ref 96–106)
Creatinine, Ser: 0.86 mg/dL (ref 0.76–1.27)
Globulin, Total: 2.5 g/dL (ref 1.5–4.5)
Glucose: 187 mg/dL — ABNORMAL HIGH (ref 70–99)
Potassium: 4.1 mmol/L (ref 3.5–5.2)
Sodium: 139 mmol/L (ref 134–144)
Total Protein: 6.9 g/dL (ref 6.0–8.5)
eGFR: 91 mL/min/{1.73_m2} (ref >59)

## 2021-03-14 LAB — BAYER DCA HB A1C WAIVED: HB A1C (BAYER DCA - WAIVED): 7.4 % — ABNORMAL HIGH (ref 4.8–5.6)

## 2021-03-14 LAB — TSH: TSH: 1.14 u[IU]/mL (ref 0.450–4.500)

## 2021-03-14 MED ORDER — ALBUTEROL SULFATE HFA 108 (90 BASE) MCG/ACT IN AERS: 2.0000 | INHALATION_SPRAY | Freq: Four times a day (QID) | RESPIRATORY_TRACT | 0 refills | Status: DC | PRN

## 2021-03-14 MED ORDER — BREZTRI AEROSPHERE 160-9-4.8 MCG/ACT IN AERO: 2.0000 | INHALATION_SPRAY | Freq: Two times a day (BID) | RESPIRATORY_TRACT | 11 refills | Status: DC

## 2021-03-14 NOTE — Patient Instructions (Signed)
Peripheral Edema Peripheral edema is swelling that is caused by a buildup of fluid. Peripheral edema most often affects the lower legs, ankles, and feet. It can also develop in the arms, hands, and face. The area of the body that has peripheral edema will look swollen. It may also feel heavy or warm. Your clothes may start to feel tight. Pressing on the area may make a temporary dent in your skin. You may not be able to move your swollen arm or leg as much as usual. There are many causes of peripheral edema. It can happen because of a complication of other conditions such as congestive heart failure, kidney disease, or a problem with your blood circulation. It also can be a side effect of certain medicines or because of an infection. It often happens to women during pregnancy. Sometimes, the cause is not known. Follow these instructions at home: Managing pain, stiffness, and swelling  Raise (elevate) your legs while you are sitting or lying down. Move around often to prevent stiffness and to lessen swelling. Do not sit or stand for long periods of time. Wear support stockings as told by your health care provider. Medicines Take over-the-counter and prescription medicines only as told by your health care provider. Your health care provider may prescribe medicine to help your body get rid of excess water (diuretic). General instructions Pay attention to any changes in your symptoms. Follow instructions from your health care provider about limiting salt (sodium) in your diet. Sometimes, eating less salt may reduce swelling. Moisturize skin daily to help prevent skin from cracking and draining. Keep all follow-up visits as told by your health care provider. This is important. Contact a health care provider if you have: A fever. Edema that starts suddenly or is getting worse, especially if you are pregnant or have a medical condition. Swelling in only one leg. Increased swelling, redness, or pain in  one or both of your legs. Drainage or sores at the area where you have edema. Get help right away if you: Develop shortness of breath, especially when you are lying down. Have pain in your chest or abdomen. Feel weak. Feel faint. Summary Peripheral edema is swelling that is caused by a buildup of fluid. Peripheral edema most often affects the lower legs, ankles, and feet. Move around often to prevent stiffness and to lessen swelling. Do not sit or stand for long periods of time. Pay attention to any changes in your symptoms. Contact a health care provider if you have edema that starts suddenly or is getting worse, especially if you are pregnant or have a medical condition. Get help right away if you develop shortness of breath, especially when lying down. This information is not intended to replace advice given to you by your health care provider. Make sure you discuss any questions you have with your health care provider. Document Revised: 02/13/2018 Document Reviewed: 02/13/2018 Elsevier Patient Education  2022 [employer]  

## 2021-03-14 NOTE — Progress Notes (Signed)
Subjective:    Patient ID: Matthew May, male    DOB: November 27, 1945, 75 y.o.   MRN: [id number]  Chief Complaint  Patient presents with   Medical Management of Chronic Issues   Pt calls the office today chronic follow up. Pt is followed by Cardiologists CAD, aortic stenosis, and A Fib. 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 Doing well at this time.  He is considered Moribid obese with a BMI of 38 and co moribitiy of DM and HTN.  Diabetes He presents for his follow-up diabetic visit. He has type 2 diabetes mellitus. There are no hypoglycemic associated symptoms. Associated symptoms include fatigue and foot paresthesias. Pertinent negatives for diabetes include no blurred vision. Symptoms are stable. Diabetic complications include heart disease and peripheral neuropathy. Risk factors for coronary artery disease include dyslipidemia, diabetes mellitus, male sex, hypertension and sedentary lifestyle. He is following a generally unhealthy diet. His overall blood glucose range is 180-200 mg/dl. An ACE inhibitor/angiotensin II receptor blocker is being taken. Eye exam is not current.  Hypertension This is a chronic problem. The current episode started more than 1 year ago. The problem has been waxing and waning since onset. The problem is uncontrolled. Associated symptoms include malaise/fatigue, peripheral edema and shortness of breath. Pertinent negatives include no blurred vision. Risk factors for coronary artery disease include dyslipidemia, obesity and male gender. The current treatment provides moderate improvement. Identifiable causes of hypertension include a thyroid problem.  Hyperlipidemia This is a chronic problem. The current episode started more than 1 year ago. Exacerbating diseases include obesity. Associated symptoms include shortness of breath. Current antihyperlipidemic treatment includes statins. The current treatment provides moderate improvement of lipids. Risk  factors for coronary artery disease include dyslipidemia, male sex, hypertension and a sedentary lifestyle.  Thyroid Problem Presents for follow-up visit. Symptoms include depressed mood and fatigue. Patient reports no hoarse voice. His past medical history is significant for hyperlipidemia.   COPD Stopped using Breo because it was making him sick. Has SOB.   Review of Systems  Constitutional:  Positive for fatigue and malaise/fatigue.  HENT:  Negative for hoarse voice.   Eyes:  Negative for blurred vision.  Respiratory:  Positive for shortness of breath.   All other systems reviewed and are negative.     Objective:   Physical Exam Vitals reviewed.  Constitutional:      General: He is not in acute distress.    Appearance: He is well-developed. He is obese.  HENT:     Head: Normocephalic.     Right Ear: Tympanic membrane normal.     Left Ear: Tympanic membrane normal.  Eyes:     General:        Right eye: No discharge.        Left eye: No discharge.     Pupils: Pupils are equal, round, and reactive to light.  Neck:     Thyroid: No thyromegaly.  Cardiovascular:     Rate and Rhythm: Normal rate and regular rhythm.     Heart sounds: Normal heart sounds. No murmur heard. Pulmonary:     Effort: Pulmonary effort is normal. No respiratory distress.     Breath sounds: Normal breath sounds. No wheezing.  Abdominal:     General: Bowel sounds are normal. There is no distension.     Palpations: Abdomen is soft.     Tenderness: There is no abdominal tenderness.  Musculoskeletal:        General: No tenderness. Normal range  of motion.     Cervical back: Normal range of motion and neck supple.     Right lower leg: Edema (2+) present.     Left lower leg: Edema (2+) present.  Skin:    General: Skin is warm and dry.     Findings: No erythema or rash.  Neurological:     Mental Status: He is alert and oriented to person, place, and time.     Cranial Nerves: No cranial nerve deficit.      Deep Tendon Reflexes: Reflexes are normal and symmetric.  Psychiatric:        Behavior: Behavior normal.        Thought Content: Thought content normal.        Judgment: Judgment normal.      BP (!) 165/64   Pulse 87   Temp 97.6 F (36.4 C) (Temporal)   Ht '5\' 7"'  (1.702 m)   Wt 244 lb (110.7 kg)   SpO2 96%   BMI 38.22 kg/m      Assessment & Plan:  Matthew May comes in today with chief complaint of Medical Management of Chronic Issues   Diagnosis and orders addressed:  1. Need for immunization against influenza - Flu Vaccine QUAD High Dose(Fluad) - CMP14+EGFR  2. Primary hypertension - CMP14+EGFR  3. Atherosclerosis of native coronary artery of native heart without angina pectoris - CMP14+EGFR  4. Chronic obstructive pulmonary disease, unspecified COPD type ([place]) Start Breztri- Sample given in office Long discussion the difference between [place] and albuterol.  Good rx coupon given for albuterol  - CMP14+EGFR - Budeson-Glycopyrrol-Formoterol (BREZTRI AEROSPHERE) 160-9-4.8 MCG/ACT AERO; Inhale 2 puffs into the lungs 2 (two) times daily.  Dispense: 10.7 g; Refill: 11 - albuterol (VENTOLIN HFA) 108 (90 Base) MCG/ACT inhaler; Inhale 2 puffs into the lungs every 6 (six) hours as needed for wheezing or shortness of breath.  Dispense: 8 g; Refill: 0  5. Type 2 diabetes mellitus with hyperglycemia, without long-term current use of insulin (HCC) - Bayer DCA Hb A1c Waived - CMP14+EGFR  6. Other specified hypothyroidism - CMP14+EGFR - TSH  7. Hyperlipidemia, unspecified hyperlipidemia type - CMP14+EGFR  8. Morbid obesity ([place]) - CMP14+EGFR   Labs pending Health Maintenance reviewed Diet and exercise encouraged  Follow up plan: 3 months   Evelina Dun, FNP

## 2021-03-23 ENCOUNTER — Ambulatory Visit (INDEPENDENT_AMBULATORY_CARE_PROVIDER_SITE_OTHER): Payer: Medicare Other | Admitting: Family

## 2021-03-23 ENCOUNTER — Encounter: Payer: Self-pay | Admitting: Family

## 2021-03-23 DIAGNOSIS — R42 Dizziness and giddiness: Secondary | ICD-10-CM

## 2021-03-23 DIAGNOSIS — K2970 Gastritis, unspecified, without bleeding: Secondary | ICD-10-CM | POA: Diagnosis not present

## 2021-03-23 MED ORDER — MECLIZINE HCL 25 MG PO TABS: 25.0000 mg | ORAL_TABLET | Freq: Three times a day (TID) | ORAL | 0 refills | Status: DC | PRN

## 2021-03-23 MED ORDER — ONDANSETRON HCL 4 MG PO TABS: 4.0000 mg | ORAL_TABLET | Freq: Three times a day (TID) | ORAL | 0 refills | Status: DC | PRN

## 2021-03-23 NOTE — Progress Notes (Signed)
   Virtual Visit  Note Due to COVID-19 pandemic this visit was conducted virtually. This visit type was conducted due to national recommendations for restrictions regarding the COVID-19 Pandemic (e.g. social distancing, sheltering in place) in an effort to limit this patient's exposure and mitigate transmission in our community. All issues noted in this document were discussed and addressed.  A physical exam was not performed with this format.  I connected with Matthew May on 03/23/21 at 11:56 AM  by telephone and verified that I am speaking with the correct person using two identifiers. Matthew May is currently located at home and no one is currently with him  during visit. The provider, Evelina Dun, FNP is located in their office at time of visit.  I discussed the limitations, risks, security and privacy concerns of performing an evaluation and management service by telephone and the availability of in person appointments. I also discussed with the patient that there may be a patient responsible charge related to this service. The patient expressed understanding and agreed to proceed.   History and Present Illness:  Emesis  This is a new problem. The current episode started yesterday. The problem occurs 2 to 4 times per day. The problem has been gradually worsening. The emesis has an appearance of stomach contents. There has been no fever. Associated symptoms include chills. Pertinent negatives include no arthralgias, coughing, diarrhea, fever, headaches, myalgias, sweats or URI. Risk factors include ill contacts. He has tried bed rest for the symptoms. The treatment provided mild relief.     Review of Systems  Constitutional:  Positive for chills. Negative for fever.  Respiratory:  Negative for cough.   Gastrointestinal:  Positive for vomiting. Negative for diarrhea.  Musculoskeletal:  Negative for arthralgias and myalgias.  Neurological:  Negative for headaches.     Observations/Objective: No SOB or distress noted  Assessment and Plan: .1. Viral gastritis Force fluids Bland diet Zofran as needed Call if symptoms worsen or do not improve - ondansetron (ZOFRAN) 4 MG tablet; Take 1 tablet (4 mg total) by mouth every 8 (eight) hours as needed for nausea or vomiting.  Dispense: 20 tablet; Refill: 0 - meclizine (ANTIVERT) 25 MG tablet; Take 1 tablet (25 mg total) by mouth 3 (three) times daily as needed for dizziness.  Dispense: 30 tablet; Refill: 0  2. Dizziness Fall precautions discussed  Antivert as needed     I discussed the assessment and treatment plan with the patient. The patient was provided an opportunity to ask questions and all were answered. The patient agreed with the plan and demonstrated an understanding of the instructions.   The patient was advised to call back or seek an in-person evaluation if the symptoms worsen or if the condition fails to improve as anticipated.  The above assessment and management plan was discussed with the patient. The patient verbalized understanding of and has agreed to the management plan. Patient is aware to call the clinic if symptoms persist or worsen. Patient is aware when to return to the clinic for a follow-up visit. Patient educated on when it is appropriate to go to the emergency department.   Time call ended:  12:07 pm   I provided 11 minutes of  non face-to-face time during this encounter.    Evelina Dun, FNP

## 2021-03-28 ENCOUNTER — Other Ambulatory Visit: Payer: Self-pay | Admitting: Family

## 2021-03-28 DIAGNOSIS — M10071 Idiopathic gout, right ankle and foot: Secondary | ICD-10-CM

## 2021-03-28 DIAGNOSIS — M109 Gout, unspecified: Secondary | ICD-10-CM

## 2021-03-29 ENCOUNTER — Other Ambulatory Visit: Payer: Self-pay | Admitting: Family

## 2021-03-31 ENCOUNTER — Other Ambulatory Visit: Payer: Self-pay | Admitting: Cardiology

## 2021-03-31 ENCOUNTER — Other Ambulatory Visit: Payer: Self-pay | Admitting: Family

## 2021-04-01 ENCOUNTER — Other Ambulatory Visit: Payer: Self-pay | Admitting: Family

## 2021-04-07 ENCOUNTER — Ambulatory Visit (INDEPENDENT_AMBULATORY_CARE_PROVIDER_SITE_OTHER): Payer: Medicare Other | Admitting: Family

## 2021-04-07 ENCOUNTER — Other Ambulatory Visit: Payer: Self-pay

## 2021-04-07 ENCOUNTER — Encounter: Payer: Self-pay | Admitting: Family

## 2021-04-07 VITALS — BP 151/68 | HR 94 | Temp 97.2°F | Ht 67.0 in | Wt 239.4 lb

## 2021-04-07 DIAGNOSIS — H6121 Impacted cerumen, right ear: Secondary | ICD-10-CM

## 2021-04-07 DIAGNOSIS — H6982 Other specified disorders of Eustachian tube, left ear: Secondary | ICD-10-CM

## 2021-04-07 MED ORDER — CETIRIZINE HCL 10 MG PO TABS
10.0000 mg | ORAL_TABLET | Freq: Every day | ORAL | 1 refills | Status: DC
Start: 2021-04-07 — End: 2023-08-23

## 2021-04-07 MED ORDER — FLUTICASONE PROPIONATE 50 MCG/ACT NA SUSP: 2.0000 | Freq: Every day | NASAL | 6 refills | Status: DC

## 2021-04-07 NOTE — Patient Instructions (Signed)
Eustachian Tube Dysfunction °Eustachian tube dysfunction refers to a condition in which a blockage develops in the narrow passage that connects the middle ear to the back of the nose (eustachian tube). The eustachian tube regulates air pressure in the middle ear by letting air move between the ear and nose. It also helps to drain fluid from the middle ear space. °Eustachian tube dysfunction can affect one or both ears. When the eustachian tube does not function properly, air pressure, fluid, or both can build up in the middle ear. °What are the causes? °This condition occurs when the eustachian tube becomes blocked or cannot open normally. Common causes of this condition include: °Ear infections. °Colds and other infections that affect the nose, mouth, and throat (upper respiratory tract). °Allergies. °Irritation from cigarette smoke. °Irritation from stomach acid coming up into the esophagus (gastroesophageal reflux). The esophagus is the part of the body that moves food from the mouth to the stomach. °Sudden changes in air pressure, such as from descending in an airplane or scuba diving. °Abnormal growths in the nose or throat, such as: °Growths that line the nose (nasal polyps). °Abnormal growth of cells (tumors). °Enlarged tissue at the back of the throat (adenoids). °What increases the risk? °You are more likely to develop this condition if: °You smoke. °You are overweight. °You are a child who has: °Certain birth defects of the mouth, such as cleft palate. °Large tonsils or adenoids. °What are the signs or symptoms? °Common symptoms of this condition include: °A feeling of fullness in the ear. °Ear pain. °Clicking or popping noises in the ear. °Ringing in the ear (tinnitus). °Hearing loss. °Loss of balance. °Dizziness. °Symptoms may get worse when the air pressure around you changes, such as when you travel to an area of high elevation, fly on an airplane, or go scuba diving. °How is this diagnosed? °This  condition may be diagnosed based on: °Your symptoms. °A physical exam of your ears, nose, and throat. °Tests, such as those that measure: °The movement of your eardrum. °Your hearing (audiometry). °How is this treated? °Treatment depends on the cause and severity of your condition. °In mild cases, you may relieve your symptoms by moving air into your ears. This is called "popping the ears." °In more severe cases, or if you have symptoms of fluid in your ears, treatment may include: °Medicines to relieve congestion (decongestants). °Medicines that treat allergies (antihistamines). °Nasal sprays or ear drops that contain medicines that reduce swelling (steroids). °A procedure to drain the fluid in your eardrum. In this procedure, a small tube may be placed in the eardrum to: °Drain the fluid. °Restore the air in the middle ear space. °A procedure to insert a balloon device through the nose to inflate the opening of the eustachian tube (balloon dilation). °Follow these instructions at home: °Lifestyle °Do not do any of the following until your health care provider approves: °Travel to high altitudes. °Fly in airplanes. °Work in a pressurized cabin or room. °Scuba dive. °Do not use any products that contain nicotine or tobacco. These products include cigarettes, chewing tobacco, and vaping devices, such as e-cigarettes. If you need help quitting, ask your health care provider. °Keep your ears dry. Wear fitted earplugs during showering and bathing. Dry your ears completely after. °General instructions °Take over-the-counter and prescription medicines only as told by your health care provider. °Use techniques to help pop your ears as recommended by your health care provider. These may include: °Chewing gum. °Yawning. °Frequent, forceful swallowing. °Closing   your mouth, holding your nose closed, and gently blowing as if you are trying to blow air out of your nose. °Keep all follow-up visits. This is important. °Contact a  health care provider if: °Your symptoms do not go away after treatment. °Your symptoms come back after treatment. °You are unable to pop your ears. °You have: °A fever. °Pain in your ear. °Pain in your head or neck. °Fluid draining from your ear. °Your hearing suddenly changes. °You become very dizzy. °You lose your balance. °Get help right away if: °You have a sudden, severe increase in any of your symptoms. °Summary °Eustachian tube dysfunction refers to a condition in which a blockage develops in the eustachian tube. °It can be caused by ear infections, allergies, inhaled irritants, or abnormal growths in the nose or throat. °Symptoms may include ear pain or fullness, hearing loss, or ringing in the ears. °Mild cases are treated with techniques to unblock the ears, such as yawning or chewing gum. °More severe cases are treated with medicines or procedures. °This information is not intended to replace advice given to you by your health care provider. Make sure you discuss any questions you have with your health care provider. °Document Revised: 08/02/2020 Document Reviewed: 08/02/2020 °Elsevier Patient Education © 2022 [employer] ° °

## 2021-04-07 NOTE — Progress Notes (Signed)
Subjective:    Patient ID: Matthew May, male    DOB: 24-Oct-1945, 75 y.o.   MRN: [id number]  Chief Complaint  Patient presents with   Ear Fullness    Pt presents to the office today with bilateral ear fullness.  Ear Fullness  There is pain in both ears. The current episode started 1 to 4 weeks ago. The problem occurs constantly. There has been no fever. The patient is experiencing no pain. Associated symptoms include hearing loss. Pertinent negatives include no headaches, neck pain, rhinorrhea or sore throat. He has tried nothing for the symptoms. The treatment provided no relief.     Review of Systems  HENT:  Positive for hearing loss. Negative for rhinorrhea and sore throat.   Musculoskeletal:  Negative for neck pain.  Neurological:  Negative for headaches.  All other systems reviewed and are negative.     Objective:   Physical Exam Vitals reviewed.  Constitutional:      General: He is not in acute distress.    Appearance: He is well-developed.  HENT:     Head: Normocephalic.     Right Ear: There is impacted cerumen.     Left Ear: A middle ear effusion is present.     Ears:     Comments: Decrease hearing  Eyes:     General:        Right eye: No discharge.        Left eye: No discharge.     Pupils: Pupils are equal, round, and reactive to light.  Neck:     Thyroid: No thyromegaly.  Cardiovascular:     Rate and Rhythm: Normal rate and regular rhythm.     Heart sounds: Normal heart sounds. No murmur heard. Pulmonary:     Effort: Pulmonary effort is normal. No respiratory distress.     Breath sounds: Normal breath sounds. No wheezing.  Abdominal:     General: Bowel sounds are normal. There is no distension.     Palpations: Abdomen is soft.     Tenderness: There is no abdominal tenderness.  Musculoskeletal:        General: No tenderness. Normal range of motion.     Cervical back: Normal range of motion and neck supple.  Skin:    General: Skin is warm and dry.      Findings: No erythema or rash.  Neurological:     Mental Status: He is alert and oriented to person, place, and time.     Cranial Nerves: No cranial nerve deficit.     Deep Tendon Reflexes: Reflexes are normal and symmetric.  Psychiatric:        Behavior: Behavior normal.        Thought Content: Thought content normal.        Judgment: Judgment normal.   Right ear washed with warm water and peroxide. About half of cerumen impaction removed. Unable to see TM.     BP (!) 151/68   Pulse 94   Temp (!) 97.2 F (36.2 C) (Temporal)   Ht 5\' 7"  (1.702 m)   Wt 239 lb 6.4 oz (108.6 kg)   BMI 37.50 kg/m      Assessment & Plan:  Matthew May comes in today with chief complaint of Ear Fullness   Diagnosis and orders addressed:  1. Dysfunction of left eustachian tube Start zytrec 10 mg and flonase Force fluids - cetirizine (ZYRTEC) 10 MG tablet; Take 1 tablet (10 mg total) by mouth daily.  Dispense: 90 tablet; Refill: 1 - fluticasone (FLONASE) 50 MCG/ACT nasal spray; Place 2 sprays into both nostrils daily.  Dispense: 16 g; Refill: 6  2. Impacted cerumen of right ear Pt will get Debrox drops OTC     Evelina Dun, FNP

## 2021-04-13 ENCOUNTER — Other Ambulatory Visit: Payer: Self-pay | Admitting: Family

## 2021-04-13 DIAGNOSIS — R609 Edema, unspecified: Secondary | ICD-10-CM

## 2021-04-13 DIAGNOSIS — E1159 Type 2 diabetes mellitus with other circulatory complications: Secondary | ICD-10-CM

## 2021-04-13 DIAGNOSIS — I152 Hypertension secondary to endocrine disorders: Secondary | ICD-10-CM

## 2021-04-15 ENCOUNTER — Ambulatory Visit (INDEPENDENT_AMBULATORY_CARE_PROVIDER_SITE_OTHER): Payer: Medicare Other | Admitting: Family

## 2021-04-15 ENCOUNTER — Encounter: Payer: Self-pay | Admitting: Family

## 2021-04-15 ENCOUNTER — Other Ambulatory Visit: Payer: Self-pay

## 2021-04-15 VITALS — BP 142/69 | HR 99 | Temp 97.6°F | Ht 67.0 in | Wt 239.0 lb

## 2021-04-15 DIAGNOSIS — H6121 Impacted cerumen, right ear: Secondary | ICD-10-CM

## 2021-04-15 DIAGNOSIS — H938X1 Other specified disorders of right ear: Secondary | ICD-10-CM

## 2021-04-15 DIAGNOSIS — H6983 Other specified disorders of Eustachian tube, bilateral: Secondary | ICD-10-CM | POA: Diagnosis not present

## 2021-04-15 NOTE — Progress Notes (Signed)
Subjective:    Patient ID: Matthew May, male    DOB: 1945/12/23, 75 y.o.   MRN: [id number]  Chief Complaint  Patient presents with   Ear Fullness    Ear Fullness  There is pain in the right ear. This is a recurrent problem. The current episode started 1 to 4 weeks ago. The problem occurs every few minutes. There has been no fever. The pain is mild. Associated symptoms include hearing loss. Pertinent negatives include no headaches, rhinorrhea or sore throat. Treatments tried: debrox.  Hypertension This is a chronic problem. The current episode started more than 1 year ago. The problem has been waxing and waning since onset. The problem is uncontrolled. Associated symptoms include peripheral edema. Pertinent negatives include no headaches, malaise/fatigue or shortness of breath. Risk factors for coronary artery disease include dyslipidemia, obesity and male gender. The current treatment provides mild improvement.     Review of Systems  Constitutional:  Negative for malaise/fatigue.  HENT:  Positive for hearing loss. Negative for rhinorrhea and sore throat.   Respiratory:  Negative for shortness of breath.   Neurological:  Negative for headaches.  All other systems reviewed and are negative.     Objective:   Physical Exam Vitals reviewed.  Constitutional:      General: He is not in acute distress.    Appearance: He is well-developed.  HENT:     Head: Normocephalic.     Right Ear: There is impacted cerumen.     Left Ear: Tympanic membrane normal.  Eyes:     General:        Right eye: No discharge.        Left eye: No discharge.     Pupils: Pupils are equal, round, and reactive to light.  Neck:     Thyroid: No thyromegaly.  Cardiovascular:     Rate and Rhythm: Normal rate and regular rhythm.     Heart sounds: Normal heart sounds. No murmur heard. Pulmonary:     Effort: Pulmonary effort is normal. No respiratory distress.     Breath sounds: Normal breath sounds. No  wheezing.  Abdominal:     General: Bowel sounds are normal. There is no distension.     Palpations: Abdomen is soft.     Tenderness: There is no abdominal tenderness.  Musculoskeletal:        General: No tenderness. Normal range of motion.     Cervical back: Normal range of motion and neck supple.  Skin:    General: Skin is warm and dry.     Findings: No erythema or rash.  Neurological:     Mental Status: He is alert and oriented to person, place, and time.     Cranial Nerves: No cranial nerve deficit.     Deep Tendon Reflexes: Reflexes are normal and symmetric.  Psychiatric:        Behavior: Behavior normal.        Thought Content: Thought content normal.        Judgment: Judgment normal.   Attempted to wash out right ear, unable to get completely resolved.   BP (!) 199/80   Pulse 99   Temp 97.6 F (36.4 C) (Temporal)   Ht 5\' 7"  (1.702 m)   Wt 239 lb (108.4 kg)   BMI 37.43 kg/m      Assessment & Plan:  Matthew May comes in today with chief complaint of Ear Fullness   Diagnosis and orders addressed:  1. Sensation  of fullness in right ear - Ambulatory referral to ENT  2. Impacted cerumen of right ear - Ambulatory referral to ENT  3. Dysfunction of both eustachian tubes - Ambulatory referral to ENT  Continue zyrtec and flonase  Health Maintenance reviewed Diet and exercise encouraged     Evelina Dun, FNP

## 2021-04-15 NOTE — Patient Instructions (Signed)
Eustachian Tube Dysfunction °Eustachian tube dysfunction refers to a condition in which a blockage develops in the narrow passage that connects the middle ear to the back of the nose (eustachian tube). The eustachian tube regulates air pressure in the middle ear by letting air move between the ear and nose. It also helps to drain fluid from the middle ear space. °Eustachian tube dysfunction can affect one or both ears. When the eustachian tube does not function properly, air pressure, fluid, or both can build up in the middle ear. °What are the causes? °This condition occurs when the eustachian tube becomes blocked or cannot open normally. Common causes of this condition include: °Ear infections. °Colds and other infections that affect the nose, mouth, and throat (upper respiratory tract). °Allergies. °Irritation from cigarette smoke. °Irritation from stomach acid coming up into the esophagus (gastroesophageal reflux). The esophagus is the part of the body that moves food from the mouth to the stomach. °Sudden changes in air pressure, such as from descending in an airplane or scuba diving. °Abnormal growths in the nose or throat, such as: °Growths that line the nose (nasal polyps). °Abnormal growth of cells (tumors). °Enlarged tissue at the back of the throat (adenoids). °What increases the risk? °You are more likely to develop this condition if: °You smoke. °You are overweight. °You are a child who has: °Certain birth defects of the mouth, such as cleft palate. °Large tonsils or adenoids. °What are the signs or symptoms? °Common symptoms of this condition include: °A feeling of fullness in the ear. °Ear pain. °Clicking or popping noises in the ear. °Ringing in the ear (tinnitus). °Hearing loss. °Loss of balance. °Dizziness. °Symptoms may get worse when the air pressure around you changes, such as when you travel to an area of high elevation, fly on an airplane, or go scuba diving. °How is this diagnosed? °This  condition may be diagnosed based on: °Your symptoms. °A physical exam of your ears, nose, and throat. °Tests, such as those that measure: °The movement of your eardrum. °Your hearing (audiometry). °How is this treated? °Treatment depends on the cause and severity of your condition. °In mild cases, you may relieve your symptoms by moving air into your ears. This is called "popping the ears." °In more severe cases, or if you have symptoms of fluid in your ears, treatment may include: °Medicines to relieve congestion (decongestants). °Medicines that treat allergies (antihistamines). °Nasal sprays or ear drops that contain medicines that reduce swelling (steroids). °A procedure to drain the fluid in your eardrum. In this procedure, a small tube may be placed in the eardrum to: °Drain the fluid. °Restore the air in the middle ear space. °A procedure to insert a balloon device through the nose to inflate the opening of the eustachian tube (balloon dilation). °Follow these instructions at home: °Lifestyle °Do not do any of the following until your health care provider approves: °Travel to high altitudes. °Fly in airplanes. °Work in a pressurized cabin or room. °Scuba dive. °Do not use any products that contain nicotine or tobacco. These products include cigarettes, chewing tobacco, and vaping devices, such as e-cigarettes. If you need help quitting, ask your health care provider. °Keep your ears dry. Wear fitted earplugs during showering and bathing. Dry your ears completely after. °General instructions °Take over-the-counter and prescription medicines only as told by your health care provider. °Use techniques to help pop your ears as recommended by your health care provider. These may include: °Chewing gum. °Yawning. °Frequent, forceful swallowing. °Closing   your mouth, holding your nose closed, and gently blowing as if you are trying to blow air out of your nose. °Keep all follow-up visits. This is important. °Contact a  health care provider if: °Your symptoms do not go away after treatment. °Your symptoms come back after treatment. °You are unable to pop your ears. °You have: °A fever. °Pain in your ear. °Pain in your head or neck. °Fluid draining from your ear. °Your hearing suddenly changes. °You become very dizzy. °You lose your balance. °Get help right away if: °You have a sudden, severe increase in any of your symptoms. °Summary °Eustachian tube dysfunction refers to a condition in which a blockage develops in the eustachian tube. °It can be caused by ear infections, allergies, inhaled irritants, or abnormal growths in the nose or throat. °Symptoms may include ear pain or fullness, hearing loss, or ringing in the ears. °Mild cases are treated with techniques to unblock the ears, such as yawning or chewing gum. °More severe cases are treated with medicines or procedures. °This information is not intended to replace advice given to you by your health care provider. Make sure you discuss any questions you have with your health care provider. °Document Revised: 08/02/2020 Document Reviewed: 08/02/2020 °Elsevier Patient Education © 2022 [employer] ° °

## 2021-04-19 ENCOUNTER — Other Ambulatory Visit: Payer: Self-pay | Admitting: Family

## 2021-04-22 ENCOUNTER — Other Ambulatory Visit: Payer: Self-pay | Admitting: Physician Assistant

## 2021-05-06 ENCOUNTER — Other Ambulatory Visit: Payer: Self-pay | Admitting: Family

## 2021-05-24 ENCOUNTER — Other Ambulatory Visit: Payer: Self-pay | Admitting: Family

## 2021-06-14 ENCOUNTER — Ambulatory Visit (INDEPENDENT_AMBULATORY_CARE_PROVIDER_SITE_OTHER): Payer: Medicare Other | Admitting: Family

## 2021-06-14 ENCOUNTER — Encounter: Payer: Self-pay | Admitting: Family

## 2021-06-14 VITALS — BP 131/55 | HR 93 | Temp 97.1°F | Ht 67.0 in | Wt 244.0 lb

## 2021-06-14 DIAGNOSIS — J449 Chronic obstructive pulmonary disease, unspecified: Secondary | ICD-10-CM | POA: Diagnosis not present

## 2021-06-14 DIAGNOSIS — I10 Essential (primary) hypertension: Secondary | ICD-10-CM

## 2021-06-14 DIAGNOSIS — Z952 Presence of prosthetic heart valve: Secondary | ICD-10-CM

## 2021-06-14 DIAGNOSIS — E038 Other specified hypothyroidism: Secondary | ICD-10-CM

## 2021-06-14 DIAGNOSIS — E1169 Type 2 diabetes mellitus with other specified complication: Secondary | ICD-10-CM | POA: Diagnosis not present

## 2021-06-14 DIAGNOSIS — E559 Vitamin D deficiency, unspecified: Secondary | ICD-10-CM

## 2021-06-14 DIAGNOSIS — E785 Hyperlipidemia, unspecified: Secondary | ICD-10-CM

## 2021-06-14 DIAGNOSIS — I350 Nonrheumatic aortic (valve) stenosis: Secondary | ICD-10-CM

## 2021-06-14 LAB — BAYER DCA HB A1C WAIVED: HB A1C (BAYER DCA - WAIVED): 7.9 % — ABNORMAL HIGH (ref 4.8–5.6)

## 2021-06-14 NOTE — Progress Notes (Signed)
Subjective:    Patient ID: Matthew May, male    DOB: 1945-08-17, 76 y.o.   MRN: [id number]  Chief Complaint  Patient presents with   Medical Management of Chronic Issues   Pt calls the office today chronic follow up. Pt is followed by Cardiologists CAD, aortic stenosis, and A Fib. 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    Matthew May is considered Moribid obese with a BMI of 38 and co moribitiy of DM and HTN.   Matthew May has COPD and states this is stable. Quit smoking in 2011. Diabetes Matthew May presents for his follow-up diabetic visit. Matthew May has type 2 diabetes mellitus. There are no hypoglycemic associated symptoms. Associated symptoms include blurred vision, fatigue and foot paresthesias. Symptoms are stable. Diabetic complications include peripheral neuropathy. Pertinent negatives for diabetic complications include no nephropathy. Risk factors for coronary artery disease include dyslipidemia, diabetes mellitus, male sex, hypertension and sedentary lifestyle. Matthew May is following a generally unhealthy diet. His overall blood glucose range is 180-200 mg/dl. Eye exam is not current.  Hypertension This is a chronic problem. The current episode started more than 1 year ago. The problem has been waxing and waning since onset. The problem is uncontrolled. Associated symptoms include blurred vision, malaise/fatigue, peripheral edema and shortness of breath. Risk factors for coronary artery disease include dyslipidemia, diabetes mellitus, obesity, male gender and sedentary lifestyle. The current treatment provides moderate improvement. Identifiable causes of hypertension include a thyroid problem.  Thyroid Problem Presents for follow-up visit. Symptoms include fatigue and hoarse voice. Patient reports no constipation or depressed mood. The symptoms have been stable. His past medical history is significant for hyperlipidemia.  Hyperlipidemia This is a chronic problem. The current episode started more than  1 year ago. The problem is controlled. Exacerbating diseases include obesity. Associated symptoms include shortness of breath. Current antihyperlipidemic treatment includes statins. The current treatment provides moderate improvement of lipids. Risk factors for coronary artery disease include dyslipidemia, diabetes mellitus, male sex, hypertension and a sedentary lifestyle.     Review of Systems  Constitutional:  Positive for fatigue and malaise/fatigue.  HENT:  Positive for hoarse voice.   Eyes:  Positive for blurred vision.  Respiratory:  Positive for shortness of breath.   Gastrointestinal:  Negative for constipation.  All other systems reviewed and are negative.     Objective:   Physical Exam Vitals reviewed.  Constitutional:      General: Matthew May is not in acute distress.    Appearance: Matthew May is well-developed. Matthew May is obese.  HENT:     Head: Normocephalic.     Right Ear: Tympanic membrane normal.     Left Ear: Tympanic membrane normal.  Eyes:     General:        Right eye: No discharge.        Left eye: No discharge.     Pupils: Pupils are equal, round, and reactive to light.  Neck:     Thyroid: No thyromegaly.  Cardiovascular:     Rate and Rhythm: Normal rate and regular rhythm.     Heart sounds: Normal heart sounds. No murmur heard. Pulmonary:     Effort: Pulmonary effort is normal. No respiratory distress.     Breath sounds: Normal breath sounds. No wheezing.  Abdominal:     General: Bowel sounds are normal. There is no distension.     Palpations: Abdomen is soft.     Tenderness: There is no abdominal tenderness.  Musculoskeletal:  General: No tenderness. Normal range of motion.     Cervical back: Normal range of motion and neck supple.     Right lower leg: Edema (2+) present.     Left lower leg: Edema (2+) present.     Comments: Bilateral discoloration of bilateral legs from PAD  Skin:    General: Skin is warm and dry.     Findings: No erythema or rash.   Neurological:     Mental Status: Matthew May is alert and oriented to person, place, and time.     Cranial Nerves: No cranial nerve deficit.     Motor: Weakness present.     Gait: Gait abnormal (using cane).     Deep Tendon Reflexes: Reflexes are normal and symmetric.  Psychiatric:        Behavior: Behavior normal.        Thought Content: Thought content normal.        Judgment: Judgment normal.        BP (!) 131/55    Pulse 93    Temp (!) 97.1 F (36.2 C) (Oral)    Ht '5\' 7"'  (1.702 m)    Wt 244 lb (110.7 kg)    SpO2 98%    BMI 38.22 kg/m   Assessment & Plan:  Matthew May comes in today with chief complaint of Medical Management of Chronic Issues   Diagnosis and orders addressed:  1. Primary hypertension - CMP14+EGFR - CBC with Differential/Platelet  2. Chronic obstructive pulmonary disease, unspecified COPD type ([place]) - CMP14+EGFR - CBC with Differential/Platelet  3. Type 2 diabetes mellitus with other specified complication, without long-term current use of insulin ([facility]) - CMP14+EGFR - CBC with Differential/Platelet - Bayer DCA Hb A1c Waived  4. Other specified hypothyroidism - CMP14+EGFR - CBC with Differential/Platelet  5. Hyperlipidemia associated with type 2 diabetes mellitus ([facility]) - CMP14+EGFR - CBC with Differential/Platelet  6. 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 Vitamin D deficiency - CMP14+EGFR - CBC with Differential/Platelet  9. Severe aortic stenosis - CMP14+EGFR - CBC with Differential/Platelet   Labs pending Health Maintenance reviewed Diet and exercise encouraged  Follow up plan: 3 months    Evelina Dun, FNP

## 2021-06-14 NOTE — Patient Instructions (Signed)
Peripheral Edema °Peripheral edema is swelling that is caused by a buildup of fluid. Peripheral edema most often affects the lower legs, ankles, and feet. It can also develop in the arms, hands, and face. The area of the body that has peripheral edema will look swollen. It may also feel heavy or warm. Your clothes may start to feel tight. Pressing on the area may make a temporary dent in your skin. You may not be able to move your swollen arm or leg as much as usual. °There are many causes of peripheral edema. It can happen because of a complication of other conditions such as congestive heart failure, kidney disease, or a problem with your blood circulation. It also can be a side effect of certain medicines or because of an infection. It often happens to women during pregnancy. Sometimes, the cause is not known. °Follow these instructions at home: °Managing pain, stiffness, and swelling ° °Raise (elevate) your legs while you are sitting or lying down. °Move around often to prevent stiffness and to lessen swelling. °Do not sit or stand for long periods of time. °Wear support stockings as told by your health care provider. °Medicines °Take over-the-counter and prescription medicines only as told by your health care provider. °Your health care provider may prescribe medicine to help your body get rid of excess water (diuretic). °General instructions °Pay attention to any changes in your symptoms. °Follow instructions from your health care provider about limiting salt (sodium) in your diet. Sometimes, eating less salt may reduce swelling. °Moisturize skin daily to help prevent skin from cracking and draining. °Keep all follow-up visits as told by your health care provider. This is important. °Contact a health care provider if you have: °A fever. °Edema that starts suddenly or is getting worse, especially if you are pregnant or have a medical condition. °Swelling in only one leg. °Increased swelling, redness, or pain in  one or both of your legs. °Drainage or sores at the area where you have edema. °Get help right away if you: °Develop shortness of breath, especially when you are lying down. °Have pain in your chest or abdomen. °Feel weak. °Feel faint. °Summary °Peripheral edema is swelling that is caused by a buildup of fluid. Peripheral edema most often affects the lower legs, ankles, and feet. °Move around often to prevent stiffness and to lessen swelling. Do not sit or stand for long periods of time. °Pay attention to any changes in your symptoms. °Contact a health care provider if you have edema that starts suddenly or is getting worse, especially if you are pregnant or have a medical condition. °Get help right away if you develop shortness of breath, especially when lying down. °This information is not intended to replace advice given to you by your health care provider. Make sure you discuss any questions you have with your health care provider. °Document Revised: 10/21/2020 Document Reviewed: 02/13/2018 °Elsevier Patient Education © 2022 [employer] ° °

## 2021-06-15 ENCOUNTER — Other Ambulatory Visit: Payer: Self-pay | Admitting: Family

## 2021-06-15 LAB — CMP14+EGFR
ALT: 42 IU/L (ref 0–44)
AST: 31 IU/L (ref 0–40)
Albumin/Globulin Ratio: 1.8 (ref 1.2–2.2)
Albumin: 4.2 g/dL (ref 3.7–4.7)
Alkaline Phosphatase: 83 IU/L (ref 44–121)
BUN/Creatinine Ratio: 13 (ref 10–24)
BUN: 12 mg/dL (ref 8–27)
Bilirubin Total: 0.3 mg/dL (ref 0.0–1.2)
CO2: 23 mmol/L (ref 20–29)
Calcium: 9.4 mg/dL (ref 8.6–10.2)
Chloride: 102 mmol/L (ref 96–106)
Creatinine, Ser: 0.94 mg/dL (ref 0.76–1.27)
Globulin, Total: 2.3 g/dL (ref 1.5–4.5)
Glucose: 175 mg/dL — ABNORMAL HIGH (ref 70–99)
Potassium: 4.5 mmol/L (ref 3.5–5.2)
Sodium: 142 mmol/L (ref 134–144)
Total Protein: 6.5 g/dL (ref 6.0–8.5)
eGFR: 86 mL/min/{1.73_m2} (ref >59)

## 2021-06-15 LAB — CBC WITH DIFFERENTIAL/PLATELET
Basophils Absolute: 0 10*3/uL (ref 0.0–0.2)
Basos: 1 %
EOS (ABSOLUTE): 0.2 10*3/uL (ref 0.0–0.4)
Eos: 3 %
Hematocrit: 39.2 % (ref 37.5–51.0)
Hemoglobin: 12.7 g/dL — ABNORMAL LOW (ref 13.0–17.7)
Immature Grans (Abs): 0.1 10*3/uL (ref 0.0–0.1)
Immature Granulocytes: 1 %
Lymphocytes Absolute: 1.6 10*3/uL (ref 0.7–3.1)
Lymphs: 22 %
MCH: 28.7 pg (ref 26.6–33.0)
MCHC: 32.4 g/dL (ref 31.5–35.7)
MCV: 89 fL (ref 79–97)
Monocytes Absolute: 0.4 10*3/uL (ref 0.1–0.9)
Monocytes: 6 %
Neutrophils Absolute: 5 10*3/uL (ref 1.4–7.0)
Neutrophils: 67 %
Platelets: 153 10*3/uL (ref 150–450)
RBC: 4.43 x10E6/uL (ref 4.14–5.80)
RDW: 12.6 % (ref 11.6–15.4)
WBC: 7.4 10*3/uL (ref 3.4–10.8)

## 2021-06-15 NOTE — Telephone Encounter (Signed)
Will close encounter

## 2021-06-16 NOTE — Progress Notes (Signed)
Pt calling back about labs  

## 2021-06-17 ENCOUNTER — Telehealth: Payer: Self-pay | Admitting: Pharmacist

## 2021-06-17 DIAGNOSIS — E1169 Type 2 diabetes mellitus with other specified complication: Secondary | ICD-10-CM

## 2021-06-17 MED ORDER — DAPAGLIFLOZIN PROPANEDIOL 10 MG PO TABS: 10.0000 mg | ORAL_TABLET | Freq: Every day | ORAL | 4 refills | Status: DC

## 2021-06-17 NOTE — Telephone Encounter (Signed)
Refill request from [employer] patient assistance program I have not seen patient since 2021 Will send farxiga refill to [facility]  (mail order pharmacy for [facility]) Stable on dose/chart reviewed

## 2021-06-25 ENCOUNTER — Other Ambulatory Visit: Payer: Self-pay | Admitting: Family

## 2021-06-29 ENCOUNTER — Other Ambulatory Visit: Payer: Self-pay | Admitting: Family

## 2021-07-03 ENCOUNTER — Other Ambulatory Visit: Payer: Self-pay | Admitting: Family

## 2021-07-03 DIAGNOSIS — I152 Hypertension secondary to endocrine disorders: Secondary | ICD-10-CM

## 2021-07-03 DIAGNOSIS — R609 Edema, unspecified: Secondary | ICD-10-CM

## 2021-07-03 DIAGNOSIS — E1159 Type 2 diabetes mellitus with other circulatory complications: Secondary | ICD-10-CM

## 2021-07-14 ENCOUNTER — Encounter: Payer: Self-pay | Admitting: *Deleted

## 2021-07-22 ENCOUNTER — Telehealth: Payer: Self-pay | Admitting: Pharmacist

## 2021-07-22 DIAGNOSIS — E1169 Type 2 diabetes mellitus with other specified complication: Secondary | ICD-10-CM

## 2021-07-22 DIAGNOSIS — J449 Chronic obstructive pulmonary disease, unspecified: Secondary | ICD-10-CM

## 2021-07-22 MED ORDER — DAPAGLIFLOZIN PROPANEDIOL 10 MG PO TABS: 10.0000 mg | ORAL_TABLET | Freq: Every day | ORAL | 4 refills | Status: DC

## 2021-07-22 MED ORDER — BREZTRI AEROSPHERE 160-9-4.8 MCG/ACT IN AERO: 2.0000 | INHALATION_SPRAY | Freq: Two times a day (BID) | RESPIRATORY_TRACT | 11 refills | Status: DC

## 2021-07-22 NOTE — Telephone Encounter (Signed)
[employer] patient assistance refills sent for breztri and farxiga to [facility] pharmacy -->[facility] (escribed)

## 2021-08-04 ENCOUNTER — Ambulatory Visit (INDEPENDENT_AMBULATORY_CARE_PROVIDER_SITE_OTHER): Payer: Medicare Other

## 2021-08-04 VITALS — Ht 67.5 in | Wt 245.0 lb

## 2021-08-04 DIAGNOSIS — Z0000 Encounter for general adult medical examination without abnormal findings: Secondary | ICD-10-CM

## 2021-08-04 NOTE — Progress Notes (Signed)
Subjective:   Matthew May is a 76 y.o. male who presents for Medicare Annual/Subsequent preventive examination.  Virtual Visit via Telephone Note  I connected with  Matthew May on 08/04/21 at  8:15 AM EST by telephone and verified that I am speaking with the correct person using two identifiers.  Location: Patient: Home Provider: [facility] Persons participating in the virtual visit: patient/Nurse Health Advisor   I discussed the limitations, risks, security and privacy concerns of performing an evaluation and management service by telephone and the availability of in person appointments. The patient expressed understanding and agreed to proceed.  Interactive audio and video telecommunications were attempted between this nurse and patient, however failed, due to patient having technical difficulties OR patient did not have access to video capability.  We continued and completed visit with audio only.  Some vital signs may be absent or patient reported.   Adalee Kathan E Jaylenne Hamelin, LPN   Review of Systems     Cardiac Risk Factors include: advanced age (>82men, >49 women);diabetes mellitus;dyslipidemia;hypertension;male gender;obesity (BMI >30kg/m2);sedentary lifestyle;smoking/ tobacco exposure;Other (see comment), Risk factor comments: CAD, COPD, atherosclerosis, A.Fib., aortic stenosis     Objective:    Today's Vitals   08/04/21 0923  Weight: 245 lb (111.1 kg)  Height: 5' 7.5" (1.715 m)   Body mass index is 37.81 kg/m.  Advanced Directives 08/04/2021 08/03/2020 01/16/2020 01/01/2020 12/30/2019 07/30/2019 04/26/2018  Does Patient Have a Medical Advance Directive? Yes No No No No No No  Type of Paramedic of [place];Living will - - - - - -  Copy of [place] in Chart? No - copy requested - - - - - -  Would patient like information on creating a medical advance directive? - No - Patient declined - No - Patient declined No - Patient declined No -  Patient declined No - Patient declined    Current Medications (verified) Outpatient Encounter Medications as of 08/04/2021  Medication Sig   acetaminophen (TYLENOL) 325 MG tablet Take 2 tablets (650 mg total) by mouth every 6 (six) hours as needed for mild pain (or Fever >/= 101).   albuterol (VENTOLIN HFA) 108 (90 Base) MCG/ACT inhaler Inhale 2 puffs into the lungs every 6 (six) hours as needed for wheezing or shortness of breath.   atorvastatin (LIPITOR) 40 MG tablet TAKE 1 TABLET BY MOUTH  DAILY   Budeson-Glycopyrrol-Formoterol (BREZTRI AEROSPHERE) 160-9-4.8 MCG/ACT AERO Inhale 2 puffs into the lungs 2 (two) times daily.   cetirizine (ZYRTEC) 10 MG tablet Take 1 tablet (10 mg total) by mouth daily.   cholecalciferol (VITAMIN D3) 25 MCG (1000 UT) tablet Take 1,000 Units by mouth daily.   colchicine 0.6 MG tablet TAKE 2 TABLETS BY MOUTH NOW, THEN TAKE 1 ADDITIONAL TABLET IN 1 HOUR. TAKE 1 TABLET ONCE DAILY THEREAFTER UNTIL SYMPTOMS RESOVE   dapagliflozin propanediol (FARXIGA) 10 MG TABS tablet Take 1 tablet (10 mg total) by mouth daily.   ELIQUIS 5 MG TABS tablet Take 1 tablet by mouth twice daily   fluticasone (FLONASE) 50 MCG/ACT nasal spray Place 2 sprays into both nostrils daily.   glimepiride (AMARYL) 2 MG tablet Take 1 tablet by mouth once daily with breakfast   glucose blood (ONETOUCH VERIO) test strip CHECK BLOOD SUGAR DAILY Dx E11.9   Lancets (ONETOUCH DELICA PLUS [id number]) MISC Check BS daily Dx E11.9   levothyroxine (SYNTHROID) 150 MCG tablet TAKE 1 TABLET BY MOUTH  DAILY   meclizine (ANTIVERT) 25 MG tablet Take  1 tablet (25 mg total) by mouth 3 (three) times daily as needed for dizziness.   metFORMIN (GLUCOPHAGE) 1000 MG tablet TAKE 1 TABLET BY MOUTH TWICE  DAILY   nitroGLYCERIN (NITROSTAT) 0.4 MG SL tablet DISSOLVE ONE TABLET UNDER THE TONGUE EVERY 5 MINUTES AS NEEDED FOR CHEST PAIN.  DO NOT EXCEED A TOTAL OF 3 DOSES IN 15 MINUTES   ondansetron (ZOFRAN) 4 MG tablet Take 1 tablet  (4 mg total) by mouth every 8 (eight) hours as needed for nausea or vomiting.   potassium chloride SA (KLOR-CON) 20 MEQ tablet TAKE 1 TABLET BY MOUTH  DAILY   torsemide (DEMADEX) 20 MG tablet TAKE 2 TABLETS BY MOUTH DAILY   valsartan (DIOVAN) 40 MG tablet Take 1 tablet by mouth once daily   No facility-administered encounter medications on file as of 08/04/2021.    Allergies (verified) Prednisone, Januvia [sitagliptin], Jardiance [empagliflozin], and Lisinopril   History: Past Medical History:  Diagnosis Date   Atrial fibrillation ([place])    CAD (coronary artery disease)    a. s/p NSTEMI 4/11 => s/p CABG (L-LAD, S-OM2, S-PDA/PL);  b.  ETT-Myoview 6/14:  normal study, no ischemia, EF 67%   Carotid stenosis    Carotid U/S 2/26:  RICA 3-33%, LICA 54-56%; right vertebral flow retrograde suggestive of steel-consider PV consult   Cataract    COPD (chronic obstructive pulmonary disease) (HCC)    Diabetes mellitus without complication ([place])    HLD (hyperlipidemia)    HTN (hypertension)    Obesity    Renal insufficiency    Severe aortic stenosis    Subclavian artery stenosis, right ([place])    based upon carotid U/S done 11/2012   Past Surgical History:  Procedure Laterality Date   CATARACT EXTRACTION Left    CORONARY ARTERY BYPASS GRAFT     2011, LIMA to LAD coronary artery, SVG to OM2 branch of lect circumflex coronary artery, and a sequential SVG to psot descening to posterolateral branches to RCA   endoscopic vein harvesting     right leg    HERNIA REPAIR     RIGHT/LEFT HEART CATH AND CORONARY/GRAFT ANGIOGRAPHY N/A 12/31/2017   Procedure: RIGHT/LEFT HEART CATH AND CORONARY/GRAFT ANGIOGRAPHY;  Surgeon: Leonie Man, MD;  Location: [place] [facility];  Service: Cardiovascular;  Laterality: N/A;   RIGHT/LEFT HEART CATH AND CORONARY/GRAFT ANGIOGRAPHY N/A 12/30/2019   Procedure: RIGHT/LEFT HEART CATH AND CORONARY/GRAFT ANGIOGRAPHY;  Surgeon: Belva Crome, MD;  Location: [place] [facility];   Service: Cardiovascular;  Laterality: N/A;   TEE WITHOUT CARDIOVERSION N/A 01/20/2020   Procedure: TRANSESOPHAGEAL ECHOCARDIOGRAM (TEE);  Surgeon: [name], MD;  Location: [facility] [place];  Service: Open Heart Surgery;  Laterality: N/A;   TONSILLECTOMY     TRANSCATHETER AORTIC VALVE REPLACEMENT, TRANSFEMORAL N/A 01/20/2020   Procedure: TRANSCATHETER AORTIC VALVE REPLACEMENT, TRANSFEMORALUSING EDWARDS SAPIEN 3 29 MM AORTIC THV.;  Surgeon: [name], MD;  Location: [place];  Service: Open Heart Surgery;  Laterality: N/A;   Family History  Problem Relation Age of Onset   Heart disease Father    Heart attack Father    Mental illness Mother    Mental illness Sister    Diabetes Brother    Depression Maternal Aunt    Social History   Socioeconomic History   Marital status: Married    Spouse name: Lucita Ferrara   Number of children: 3   Years of education: 8   Highest education level: 8th grade  Occupational History   Occupation: Retired  Comment: scrap yard  Tobacco Use   Smoking status: Former    Types: Cigarettes    Quit date: 07/11/2009    Years since quitting: 12.0   Smokeless tobacco: Never   Tobacco comments:    smoked about 10 cig/day; used to smoke 2 ppd for many years   Vaping Use   Vaping Use: Never used  Substance and Sexual Activity   Alcohol use: No    Alcohol/week: 0.0 standard drinks   Drug use: No   Sexual activity: Never  Other Topics Concern   Not on file  Social History Narrative   Lives at home with his wife. He is retired but his wife continues to work daily. They have adult children that live locally and grandchildren that they spend time with regularly. He is active around his home and yard.    Social Determinants of Health   Financial Resource Strain: Low Risk    Difficulty of Paying Living Expenses: Not hard at all  Food Insecurity: No Food Insecurity   Worried About Charity fundraiser in the Last Year: Never true   [place] in the Last Year:  Never true  Transportation Needs: No Transportation Needs   Lack of Transportation (Medical): No   Lack of Transportation (Non-Medical): No  Physical Activity: Sufficiently Active   Days of Exercise per Week: 5 days   Minutes of Exercise per Session: 30 min  Stress: No Stress Concern Present   Feeling of Stress : Not at all  Social Connections: Moderately Isolated   Frequency of Communication with Friends and Family: More than three times a week   Frequency of Social Gatherings with Friends and Family: More than three times a week   Attends Religious Services: Never   Marine scientist or Organizations: No   Attends Music therapist: Never   Marital Status: Married    Tobacco Counseling Counseling given: Not Answered Tobacco comments: smoked about 10 cig/day; used to smoke 2 ppd for many years    Clinical Intake:  Pre-visit preparation completed: Yes  Pain : No/denies pain     BMI - recorded: 37.81 Nutritional Status: BMI > 30  Obese Nutritional Risks: None Diabetes: Yes CBG done?: No Did pt. bring in CBG monitor from home?: No  How often do you need to have someone help you when you read instructions, pamphlets, or other written materials from your doctor or pharmacy?: 1 - Never  Diabetic? Nutrition Risk Assessment:  Has the patient had any N/V/D within the last 2 months?  No  Does the patient have any non-healing wounds?  No  Has the patient had any unintentional weight loss or weight gain?  No   Diabetes:  Is the patient diabetic?  Yes  If diabetic, was a CBG obtained today?  No  Did the patient bring in their glucometer from home?  No  How often do you monitor your CBG's? Once daily fasting - 158 this am fasting.   Financial Strains and Diabetes Management:  Are you having any financial strains with the device, your supplies or your medication? No .  Does the patient want to be seen by Chronic Care Management for management of their  diabetes?  No  Would the patient like to be referred to a Nutritionist or for Diabetic Management?  No   Diabetic Exams:  Diabetic Eye Exam: Completed 2022 per patient  Diabetic Foot Exam: Completed 08/10/2020. Pt has been advised about the importance  in completing this exam. Pt is scheduled for diabetic foot exam on next visit.    Interpreter Needed?: No  Information entered by :: Davon Folta, LPN   Activities of Daily Living In your present state of health, do you have any difficulty performing the following activities: 08/04/2021  Hearing? N  Vision? N  Difficulty concentrating or making decisions? N  Walking or climbing stairs? N  Dressing or bathing? N  Doing errands, shopping? N  Preparing Food and eating ? N  Using the Toilet? N  In the past six months, have you accidently leaked urine? N  Do you have problems with loss of bowel control? N  Managing your Medications? N  Managing your Finances? N  Housekeeping or managing your Housekeeping? N  Some recent data might be hidden    Patient Care Team: Sharion Balloon, FNP as PCP - General (Nurse Practitioner) Minus Breeding, MD as PCP - Cardiology (Cardiology) Minus Breeding, MD as Consulting Physician (Cardiology) Lavera Guise, [facility] (Pharmacist)  Indicate any recent Medical Services you may have received from other than [facility] providers in the past year (date may be approximate).     Assessment:   This is a routine wellness examination for [facility].  Hearing/Vision screen Hearing Screening - Comments:: Denies hearing difficulties   Vision Screening - Comments:: Wears otc glasses for reading prn - up to date with routine eye exams with [facility] in [place] issues and exercise activities discussed: Current Exercise Habits: Home exercise routine, Type of exercise: walking;Other - see comments (outside work), Time (Minutes): 20, Frequency (Times/Week): 7, Weekly Exercise (Minutes/Week): 140, Intensity:  Mild, Exercise limited by: cardiac condition(s);respiratory conditions(s);Other - see comments (poor circluation in legs - swelling and pain)   Goals Addressed             This Visit's Progress    Exercise 150 min/wk Moderate Activity   On track    Light exercise daily       Depression Screen PHQ 2/9 Scores 08/04/2021 06/14/2021 04/15/2021 03/14/2021 08/10/2020 08/03/2020 08/02/2020  PHQ - 2 Score 0 0 0 0 0 0 0    Fall Risk Fall Risk  08/04/2021 06/14/2021 04/15/2021 03/14/2021 11/11/2020  Falls in the past year? 0 0 0 0 0  Number falls in past yr: 0 - - - -  Injury with Fall? 0 - - - -  Risk for fall due to : Impaired balance/gait - - - -  Risk for fall due to: Comment - - - - -  Follow up Falls prevention discussed - - - -    FALL RISK PREVENTION PERTAINING TO THE HOME:  Any stairs in or around the home? No  If so, are there any without handrails? No  Home free of loose throw rugs in walkways, pet beds, electrical cords, etc? Yes  Adequate lighting in your home to reduce risk of falls? Yes   ASSISTIVE DEVICES UTILIZED TO PREVENT FALLS:  Life alert? No  Use of a cane, walker or w/c? No  Grab bars in the bathroom? Yes  Shower chair or bench in shower? No  Elevated toilet seat or a handicapped toilet? No   TIMED UP AND GO:  Was the test performed? No .  Telephonic visit  Cognitive Function: MMSE - Mini Mental State Exam 04/25/2017 02/23/2016 09/17/2014  Orientation to time 5 5 5   Orientation to Place 5 5 5   Registration 3 3 3   Attention/ Calculation 4 4 5  Recall 2 2 1   Language- name 2 objects 2 2 2   Language- repeat 1 1 1   Language- follow 3 step command 2 2 3   Language- read & follow direction 1 1 1   Write a sentence 1 1 1   Copy design 1 1 1   Total score 27 27 28      6CIT Screen 08/04/2021 08/03/2020 07/30/2019 04/26/2018  What Year? 0 points 0 points 0 points 0 points  What month? 0 points 0 points 0 points 0 points  What time? 0 points 0 points 0 points 0 points   Count back from 20 0 points 0 points 0 points 0 points  Months in reverse 2 points 0 points 2 points 0 points  Repeat phrase 0 points 6 points 0 points 0 points  Total Score 2 6 2  0    Immunizations Immunization History  Administered Date(s) Administered   Fluad Quad(high Dose 65+) 02/07/2019, 03/14/2021   Influenza, High Dose Seasonal PF 03/31/2014, 04/24/2015, 03/29/2017, 03/08/2018   Influenza-Unspecified 02/03/2013, 03/31/2016, 03/29/2017, 04/30/2020   Moderna Sars-Covid-2 Vaccination 08/04/2019, 09/04/2019, 04/19/2020   Pneumococcal Conjugate-13 09/16/2014   Pneumococcal Polysaccharide-23 08/06/2012   Td 07/23/2020   Zoster Recombinat (Shingrix) 11/11/2020    TDAP status: Up to date  Flu Vaccine status: Up to date  Pneumococcal vaccine status: Up to date  Covid-19 vaccine status: Completed vaccines  Qualifies for Shingles Vaccine? Yes   Zostavax completed No   Shingrix Completed?: No.    Education has been provided regarding the importance of this vaccine. Patient has been advised to call insurance company to determine out of pocket expense if they have not yet received this vaccine. Advised may also receive vaccine at local pharmacy or Health Dept. Verbalized acceptance and understanding.  Screening Tests Health Maintenance  Topic Date Due   COVID-19 Vaccine (4 - Booster for Moderna series) 06/14/2020   OPHTHALMOLOGY EXAM  12/16/2020   Zoster Vaccines- Shingrix (2 of 2) 01/06/2021   FOOT EXAM  08/10/2021   HEMOGLOBIN A1C  12/12/2021   Fecal DNA (Cologuard)  10/27/2022   TETANUS/TDAP  07/23/2030   Pneumonia Vaccine 37+ Years old  Completed   INFLUENZA VACCINE  Completed   Hepatitis C Screening  Completed   HPV VACCINES  Aged Out    Health Maintenance  Health Maintenance Due  Topic Date Due   COVID-19 Vaccine (4 - Booster for Moderna series) 06/14/2020   OPHTHALMOLOGY EXAM  12/16/2020   Zoster Vaccines- Shingrix (2 of 2) 01/06/2021    Colorectal cancer  screening: Type of screening: Cologuard. Completed 11/20/2019. Repeat every 3 years  Lung Cancer Screening: (Low Dose CT Chest recommended if Age 30-80 years, 30 pack-year currently smoking OR have quit w/in 15years.) does not qualify.   Additional Screening:  Hepatitis C Screening: does qualify; Completed 08/23/2015  Vision Screening: Recommended annual ophthalmology exams for early detection of glaucoma and other disorders of the eye. Is the patient up to date with their annual eye exam?  Yes  Who is the provider or what is the name of the office in which the patient attends annual eye exams? [facility] If pt is not established with a provider, would they like to be referred to a provider to establish care? No .   Dental Screening: Recommended annual dental exams for proper oral hygiene  Community Resource Referral / Chronic Care Management: CRR required this visit?  No   CCM required this visit?  No      Plan:  I have personally reviewed and noted the following in the patients chart:   Medical and social history Use of alcohol, tobacco or illicit drugs  Current medications and supplements including opioid prescriptions. Patient is not currently taking opioid prescriptions. Functional ability and status Nutritional status Physical activity Advanced directives List of other physicians Hospitalizations, surgeries, and ER visits in previous 12 months Vitals Screenings to include cognitive, depression, and falls Referrals and appointments  In addition, I have reviewed and discussed with patient certain preventive protocols, quality metrics, and best practice recommendations. A written personalized care plan for preventive services as well as general preventive health recommendations were provided to patient.     Sandrea Hammond, LPN   01/08/1482   Nurse Notes: None

## 2021-08-04 NOTE — Patient Instructions (Signed)
Matthew May , Thank you for taking time to come for your Medicare Wellness Visit. I appreciate your ongoing commitment to your health goals. Please review the following plan we discussed and let me know if I can assist you in the future.   Screening recommendations/referrals: Colonoscopy: Cologuard at home test 11/20/2019 - Repeat every 3 years - due next year Recommended yearly ophthalmology/optometry visit for glaucoma screening and checkup Recommended yearly dental visit for hygiene and checkup  Vaccinations: Influenza vaccine: Done 03/14/2021 - Repeat annually Pneumococcal vaccine: Done 08/06/2012 & 09/16/2014 Tdap vaccine: Done 07/23/2020 - Repeat in 10 years Shingles vaccine: Done 11/11/2020 - get second dose at next visit   Covid-19: Done 08/04/2019, 09/04/2019, & 04/19/2020 - contact pharmacy for additional boosters  Advanced directives: Please bring a copy of your health care power of attorney and living will to the office to be added to your chart at your convenience.   Conditions/risks identified: Aim for 30 minutes of exercise or brisk walking, 6-8 glasses of water, and 5 servings of fruits and vegetables each day.   Next appointment: Follow up in one year for your annual wellness visit.   Preventive Care 63 Years and Older, Male  Preventive care refers to lifestyle choices and visits with your health care provider that can promote health and wellness. What does preventive care include? A yearly physical exam. This is also called an annual well check. Dental exams once or twice a year. Routine eye exams. Ask your health care provider how often you should have your eyes checked. Personal lifestyle choices, including: Daily care of your teeth and gums. Regular physical activity. Eating a healthy diet. Avoiding tobacco and drug use. Limiting alcohol use. Practicing safe sex. Taking low doses of aspirin every day. Taking vitamin and mineral supplements as recommended by your health  care provider. What happens during an annual well check? The services and screenings done by your health care provider during your annual well check will depend on your age, overall health, lifestyle risk factors, and family history of disease. Counseling  Your health care provider may ask you questions about your: Alcohol use. Tobacco use. Drug use. Emotional well-being. Home and relationship well-being. Sexual activity. Eating habits. History of falls. Memory and ability to understand (cognition). Work and work Statistician. Screening  You may have the following tests or measurements: Height, weight, and BMI. Blood pressure. Lipid and cholesterol levels. These may be checked every 5 years, or more frequently if you are over 37 years old. Skin check. Lung cancer screening. You may have this screening every year starting at age 66 if you have a 30-pack-year history of smoking and currently smoke or have quit within the past 15 years. Fecal occult blood test (FOBT) of the stool. You may have this test every year starting at age 61. Flexible sigmoidoscopy or colonoscopy. You may have a sigmoidoscopy every 5 years or a colonoscopy every 10 years starting at age 24. Prostate cancer screening. Recommendations will vary depending on your family history and other risks. Hepatitis C blood test. Hepatitis B blood test. Sexually transmitted disease (STD) testing. Diabetes screening. This is done by checking your blood sugar (glucose) after you have not eaten for a while (fasting). You may have this done every 1-3 years. Abdominal aortic aneurysm (AAA) screening. You may need this if you are a current or former smoker. Osteoporosis. You may be screened starting at age 83 if you are at high risk. Talk with your health care provider about  your test results, treatment options, and if necessary, the need for more tests. Vaccines  Your health care provider may recommend certain vaccines, such  as: Influenza vaccine. This is recommended every year. Tetanus, diphtheria, and acellular pertussis (Tdap, Td) vaccine. You may need a Td booster every 10 years. Zoster vaccine. You may need this after age 32. Pneumococcal 13-valent conjugate (PCV13) vaccine. One dose is recommended after age 72. Pneumococcal polysaccharide (PPSV23) vaccine. One dose is recommended after age 62. Talk to your health care provider about which screenings and vaccines you need and how often you need them. This information is not intended to replace advice given to you by your health care provider. Make sure you discuss any questions you have with your health care provider. Document Released: 06/18/2015 Document Revised: 02/09/2016 Document Reviewed: 03/23/2015 Elsevier Interactive Patient Education  2017 [place] Prevention in the Home Falls can cause injuries. They can happen to people of all ages. There are many things you can do to make your home safe and to help prevent falls. What can I do on the outside of my home? Regularly fix the edges of walkways and driveways and fix any cracks. Remove anything that might make you trip as you walk through a door, such as a raised step or threshold. Trim any bushes or trees on the path to your home. Use bright outdoor lighting. Clear any walking paths of anything that might make someone trip, such as rocks or tools. Regularly check to see if handrails are loose or broken. Make sure that both sides of any steps have handrails. Any raised decks and porches should have guardrails on the edges. Have any leaves, snow, or ice cleared regularly. Use sand or salt on walking paths during winter. Clean up any spills in your garage right away. This includes oil or grease spills. What can I do in the bathroom? Use night lights. Install grab bars by the toilet and in the tub and shower. Do not use towel bars as grab bars. Use non-skid mats or decals in the tub or  shower. If you need to sit down in the shower, use a plastic, non-slip stool. Keep the floor dry. Clean up any water that spills on the floor as soon as it happens. Remove soap buildup in the tub or shower regularly. Attach bath mats securely with double-sided non-slip rug tape. Do not have throw rugs and other things on the floor that can make you trip. What can I do in the bedroom? Use night lights. Make sure that you have a light by your bed that is easy to reach. Do not use any sheets or blankets that are too big for your bed. They should not hang down onto the floor. Have a firm chair that has side arms. You can use this for support while you get dressed. Do not have throw rugs and other things on the floor that can make you trip. What can I do in the kitchen? Clean up any spills right away. Avoid walking on wet floors. Keep items that you use a lot in easy-to-reach places. If you need to reach something above you, use a strong step stool that has a grab bar. Keep electrical cords out of the way. Do not use floor polish or wax that makes floors slippery. If you must use wax, use non-skid floor wax. Do not have throw rugs and other things on the floor that can make you trip. What can I do with  my stairs? Do not leave any items on the stairs. Make sure that there are handrails on both sides of the stairs and use them. Fix handrails that are broken or loose. Make sure that handrails are as long as the stairways. Check any carpeting to make sure that it is firmly attached to the stairs. Fix any carpet that is loose or worn. Avoid having throw rugs at the top or bottom of the stairs. If you do have throw rugs, attach them to the floor with carpet tape. Make sure that you have a light switch at the top of the stairs and the bottom of the stairs. If you do not have them, ask someone to add them for you. What else can I do to help prevent falls? Wear shoes that: Do not have high heels. Have  rubber bottoms. Are comfortable and fit you well. Are closed at the toe. Do not wear sandals. If you use a stepladder: Make sure that it is fully opened. Do not climb a closed stepladder. Make sure that both sides of the stepladder are locked into place. Ask someone to hold it for you, if possible. Clearly mark and make sure that you can see: Any grab bars or handrails. First and last steps. Where the edge of each step is. Use tools that help you move around (mobility aids) if they are needed. These include: Canes. Walkers. Scooters. Crutches. Turn on the lights when you go into a dark area. Replace any light bulbs as soon as they burn out. Set up your furniture so you have a clear path. Avoid moving your furniture around. If any of your floors are uneven, fix them. If there are any pets around you, be aware of where they are. Review your medicines with your doctor. Some medicines can make you feel dizzy. This can increase your chance of falling. Ask your doctor what other things that you can do to help prevent falls. This information is not intended to replace advice given to you by your health care provider. Make sure you discuss any questions you have with your health care provider. Document Released: 03/18/2009 Document Revised: 10/28/2015 Document Reviewed: 06/26/2014 Elsevier Interactive Patient Education  2017 [employer].

## 2021-09-01 ENCOUNTER — Other Ambulatory Visit: Payer: Self-pay | Admitting: Family

## 2021-09-08 ENCOUNTER — Ambulatory Visit: Payer: Medicare Other | Admitting: Family

## 2021-09-08 ENCOUNTER — Encounter: Payer: Self-pay | Admitting: Family

## 2021-09-12 ENCOUNTER — Ambulatory Visit: Payer: Medicare Other | Admitting: Family

## 2021-09-19 ENCOUNTER — Other Ambulatory Visit: Payer: Self-pay | Admitting: Family

## 2021-09-19 DIAGNOSIS — I152 Hypertension secondary to endocrine disorders: Secondary | ICD-10-CM

## 2021-09-19 DIAGNOSIS — R609 Edema, unspecified: Secondary | ICD-10-CM

## 2021-09-28 ENCOUNTER — Other Ambulatory Visit: Payer: Self-pay | Admitting: Family

## 2021-09-28 DIAGNOSIS — K2970 Gastritis, unspecified, without bleeding: Secondary | ICD-10-CM

## 2021-10-04 ENCOUNTER — Ambulatory Visit (INDEPENDENT_AMBULATORY_CARE_PROVIDER_SITE_OTHER): Payer: Medicare Other | Admitting: Family

## 2021-10-04 ENCOUNTER — Encounter: Payer: Self-pay | Admitting: Family

## 2021-10-04 VITALS — BP 145/68 | HR 72 | Temp 98.0°F | Resp 16 | Ht 67.5 in | Wt 247.2 lb

## 2021-10-04 DIAGNOSIS — I10 Essential (primary) hypertension: Secondary | ICD-10-CM

## 2021-10-04 DIAGNOSIS — I350 Nonrheumatic aortic (valve) stenosis: Secondary | ICD-10-CM | POA: Diagnosis not present

## 2021-10-04 DIAGNOSIS — E1169 Type 2 diabetes mellitus with other specified complication: Secondary | ICD-10-CM | POA: Diagnosis not present

## 2021-10-04 DIAGNOSIS — J449 Chronic obstructive pulmonary disease, unspecified: Secondary | ICD-10-CM | POA: Diagnosis not present

## 2021-10-04 DIAGNOSIS — E559 Vitamin D deficiency, unspecified: Secondary | ICD-10-CM | POA: Diagnosis not present

## 2021-10-04 DIAGNOSIS — E785 Hyperlipidemia, unspecified: Secondary | ICD-10-CM | POA: Diagnosis not present

## 2021-10-04 DIAGNOSIS — E038 Other specified hypothyroidism: Secondary | ICD-10-CM | POA: Diagnosis not present

## 2021-10-04 LAB — BAYER DCA HB A1C WAIVED: HB A1C (BAYER DCA - WAIVED): 7.1 % — ABNORMAL HIGH (ref 4.8–5.6)

## 2021-10-04 NOTE — Patient Instructions (Signed)
Peripheral Edema  Peripheral edema is swelling that is caused by a buildup of fluid. Peripheral edema most often affects the lower legs, ankles, and feet. It can also develop in the arms, hands, and face. The area of the body that has peripheral edema will look swollen. It may also feel heavy or warm. Your clothes may start to feel tight. Pressing on the area may make a temporary dent in your skin (pitting edema). You may not be able to move your swollen arm or leg as much as usual. There are many causes of peripheral edema. It can happen because of a complication of other conditions such as heart failure, kidney disease, or a problem with your circulation. It also can be a side effect of certain medicines or happen because of an infection. It often happens to women during pregnancy. Sometimes, the cause is not known. Follow these instructions at home: Managing pain, stiffness, and swelling  Raise (elevate) your legs while you are sitting or lying down. Move around often to prevent stiffness and to reduce swelling. Do not sit or stand for long periods of time. Do not wear tight clothing. Do not wear garters on your upper legs. Exercise your legs to get your circulation going. This helps to move the fluid back into your blood vessels, and it may help the swelling go down. Wear compression stockings as told by your health care provider. These stockings help to prevent blood clots and reduce swelling in your legs. It is important that these are the correct size. These stockings should be prescribed by your doctor to prevent possible injuries. If elastic bandages or wraps are recommended, use them as told by your health care provider. Medicines Take over-the-counter and prescription medicines only as told by your health care provider. Your health care provider may prescribe medicine to help your body get rid of excess water (diuretic). Take this medicine if you are told to take it. General  instructions Eat a low-salt (low-sodium) diet as told by your health care provider. Sometimes, eating less salt may reduce swelling. Pay attention to any changes in your symptoms. Moisturize your skin daily to help prevent skin from cracking and draining. Keep all follow-up visits. This is important. Contact a health care provider if: You have a fever. You have swelling in only one leg. You have increased swelling, redness, or pain in one or both of your legs. You have drainage or sores at the area where you have edema. Get help right away if: You have edema that starts suddenly or is getting worse, especially if you are pregnant or have a medical condition. You develop shortness of breath, especially when you are lying down. You have pain in your chest or abdomen. You feel weak. You feel like you will faint. These symptoms may be an emergency. Get help right away. Call 911. Do not wait to see if the symptoms will go away. Do not drive yourself to the hospital. Summary Peripheral edema is swelling that is caused by a buildup of fluid. Peripheral edema most often affects the lower legs, ankles, and feet. Move around often to prevent stiffness and to reduce swelling. Do not sit or stand for long periods of time. Pay attention to any changes in your symptoms. Contact a health care provider if you have edema that starts suddenly or is getting worse, especially if you are pregnant or have a medical condition. Get help right away if you develop shortness of breath, especially when lying down.   This information is not intended to replace advice given to you by your health care provider. Make sure you discuss any questions you have with your health care provider. Document Revised: 01/24/2021 Document Reviewed: 01/24/2021 Elsevier Patient Education  2023 Elsevier Inc[employer]  

## 2021-10-04 NOTE — Progress Notes (Signed)
? ?Subjective:  ? ? Patient ID: Matthew May, male    DOB: [birth year], 76 y.o.   MRN: [id number] ? ?Chief Complaint  ?Patient presents with  ? Medical Management of Chronic Issues  ? ?Pt calls the office today chronic follow up. Pt is followed by Cardiologists CAD, aortic stenosis, and A Fib. 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  ?  ?He is moribid obese with a BMI of 38 and co moribitiy of DM and HTN.  ?  ?He has COPD and states this is stable. Quit smoking in 2011. ?Hypertension ?This is a chronic problem. The current episode started more than 1 year ago. The problem has been waxing and waning since onset. The problem is controlled. Associated symptoms include malaise/fatigue, peripheral edema and shortness of breath. Pertinent negatives include no blurred vision. Risk factors for coronary artery disease include dyslipidemia, obesity, male gender and sedentary lifestyle. The current treatment provides moderate improvement. Identifiable causes of hypertension include a thyroid problem.  ?Thyroid Problem ?Presents for follow-up visit. Symptoms include fatigue and hoarse voice. Patient reports no anxiety, cold intolerance, depressed mood or diarrhea. The symptoms have been stable. His past medical history is significant for hyperlipidemia.  ?Hyperlipidemia ?This is a chronic problem. The current episode started more than 1 year ago. The problem is controlled. Recent lipid tests were reviewed and are normal. Exacerbating diseases include obesity. Associated symptoms include shortness of breath. Current antihyperlipidemic treatment includes statins. The current treatment provides moderate improvement of lipids. Risk factors for coronary artery disease include dyslipidemia, male sex, hypertension and a sedentary lifestyle.  ?Diabetes ?He presents for his follow-up diabetic visit. He has type 2 diabetes mellitus. There are no hypoglycemic associated symptoms. Pertinent negatives for hypoglycemia  include no nervousness/anxiousness. Associated symptoms include fatigue. Pertinent negatives for diabetes include no blurred vision. Diabetic complications include nephropathy and peripheral neuropathy. Risk factors for coronary artery disease include dyslipidemia, diabetes mellitus, male sex, hypertension and sedentary lifestyle. He is following a generally unhealthy diet. His overall blood glucose range is 180-200 mg/dl. An ACE inhibitor/angiotensin II receptor blocker is being taken. Eye exam is current.  ? ? ? ?Review of Systems  ?Constitutional:  Positive for fatigue and malaise/fatigue.  ?HENT:  Positive for hoarse voice.   ?Eyes:  Negative for blurred vision.  ?Respiratory:  Positive for shortness of breath.   ?Gastrointestinal:  Negative for diarrhea.  ?Endocrine: Negative for cold intolerance.  ?Psychiatric/Behavioral:  The patient is not nervous/anxious.   ?All other systems reviewed and are negative. ? ?   ?Objective:  ? Physical Exam ?Vitals reviewed.  ?Constitutional:   ?   General: He is not in acute distress. ?   Appearance: He is well-developed. He is obese.  ?HENT:  ?   Head: Normocephalic.  ?   Right Ear: Tympanic membrane normal.  ?   Left Ear: Tympanic membrane normal.  ?Eyes:  ?   General:     ?   Right eye: No discharge.     ?   Left eye: No discharge.  ?   Pupils: Pupils are equal, round, and reactive to light.  ?Neck:  ?   Thyroid: No thyromegaly.  ?Cardiovascular:  ?   Rate and Rhythm: Normal rate and regular rhythm.  ?   Heart sounds: Murmur heard.  ?Pulmonary:  ?   Effort: Pulmonary effort is normal. No respiratory distress.  ?   Breath sounds: Normal breath sounds. No wheezing.  ?Abdominal:  ?   General: Bowel  sounds are normal. There is no distension.  ?   Palpations: Abdomen is soft.  ?   Tenderness: There is no abdominal tenderness.  ?Musculoskeletal:     ?   General: No tenderness. Normal range of motion.  ?   Cervical back: Normal range of motion and neck supple.  ?   Right lower  leg: Edema (2+) present.  ?   Left lower leg: Edema (2+) present.  ?Skin: ?   General: Skin is warm and dry.  ?   Findings: No erythema or rash.  ?Neurological:  ?   Mental Status: He is alert and oriented to person, place, and time.  ?   Cranial Nerves: No cranial nerve deficit.  ?   Deep Tendon Reflexes: Reflexes are normal and symmetric.  ?Psychiatric:     ?   Behavior: Behavior normal.     ?   Thought Content: Thought content normal.     ?   Judgment: Judgment normal.  ? ? ? ? ?BP (!) 145/68   Pulse 72   Temp 98 ?F (36.7 ?C)   Resp 16   Ht 5' 7.5" (1.715 m)   Wt 247 lb 3.2 oz (112.1 kg)   SpO2 95%   BMI 38.15 kg/m?  ? ?   ?Assessment & Plan:  ?Matthew May comes in today with chief complaint of Medical Management of Chronic Issues ? ? ?Diagnosis and orders addressed: ? ?1. Primary hypertension ?- CMP14+EGFR ?- CBC with Differential/Platelet ? ?2. Chronic obstructive pulmonary disease, unspecified COPD type ([place]) ?- CMP14+EGFR ?- CBC with Differential/Platelet ? ?3. Other specified hypothyroidism ?- CMP14+EGFR ?- CBC with Differential/Platelet ?- TSH ? ?4. Hyperlipidemia associated with type 2 diabetes mellitus ([place]) ?- CMP14+EGFR ?- CBC with Differential/Platelet ? ?5. Type 2 diabetes mellitus with other specified complication, without long-term current use of insulin ([place]) ?- CMP14+EGFR ?- CBC with Differential/Platelet ?- Bayer DCA Hb A1c Waived ? ?6. Morbid obesity ([place]) ?- CMP14+EGFR ?- CBC with Differential/Platelet ? ?7. Vitamin D deficiency ?- CMP14+EGFR ?- CBC with Differential/Platelet ? ?8. Severe aortic stenosis ?- CMP14+EGFR ?- CBC with Differential/Platelet ? ? ?Labs pending ?Health Maintenance reviewed ?Diet and exercise encouraged ? ?Follow up plan: ?3 months  ? ? ?Evelina Dun, FNP ? ? ? ?

## 2021-10-05 ENCOUNTER — Other Ambulatory Visit: Payer: Self-pay | Admitting: Family

## 2021-10-05 DIAGNOSIS — E119 Type 2 diabetes mellitus without complications: Secondary | ICD-10-CM

## 2021-10-05 LAB — CMP14+EGFR
ALT: 40 IU/L (ref 0–44)
AST: 30 IU/L (ref 0–40)
Albumin/Globulin Ratio: 1.5 (ref 1.2–2.2)
Albumin: 4.5 g/dL (ref 3.7–4.7)
Alkaline Phosphatase: 80 IU/L (ref 44–121)
BUN/Creatinine Ratio: 11 (ref 10–24)
BUN: 10 mg/dL (ref 8–27)
Bilirubin Total: 0.5 mg/dL (ref 0.0–1.2)
CO2: 24 mmol/L (ref 20–29)
Calcium: 9.7 mg/dL (ref 8.6–10.2)
Chloride: 99 mmol/L (ref 96–106)
Creatinine, Ser: 0.91 mg/dL (ref 0.76–1.27)
Globulin, Total: 3.1 g/dL (ref 1.5–4.5)
Glucose: 161 mg/dL — ABNORMAL HIGH (ref 70–99)
Potassium: 4.5 mmol/L (ref 3.5–5.2)
Sodium: 141 mmol/L (ref 134–144)
Total Protein: 7.6 g/dL (ref 6.0–8.5)
eGFR: 88 mL/min/{1.73_m2} (ref >59)

## 2021-10-05 LAB — CBC WITH DIFFERENTIAL/PLATELET
Basophils Absolute: 0 10*3/uL (ref 0.0–0.2)
Basos: 0 %
EOS (ABSOLUTE): 0.2 10*3/uL (ref 0.0–0.4)
Eos: 2 %
Hematocrit: 42.8 % (ref 37.5–51.0)
Hemoglobin: 14 g/dL (ref 13.0–17.7)
Immature Grans (Abs): 0.1 10*3/uL (ref 0.0–0.1)
Immature Granulocytes: 1 %
Lymphocytes Absolute: 1.6 10*3/uL (ref 0.7–3.1)
Lymphs: 18 %
MCH: 29.1 pg (ref 26.6–33.0)
MCHC: 32.7 g/dL (ref 31.5–35.7)
MCV: 89 fL (ref 79–97)
Monocytes Absolute: 0.4 10*3/uL (ref 0.1–0.9)
Monocytes: 5 %
Neutrophils Absolute: 6.3 10*3/uL (ref 1.4–7.0)
Neutrophils: 74 %
Platelets: 150 10*3/uL (ref 150–450)
RBC: 4.81 x10E6/uL (ref 4.14–5.80)
RDW: 12.9 % (ref 11.6–15.4)
WBC: 8.5 10*3/uL (ref 3.4–10.8)

## 2021-10-05 LAB — TSH: TSH: 1.74 u[IU]/mL (ref 0.450–4.500)

## 2021-10-05 NOTE — Progress Notes (Signed)
Patient returning call. Please call back

## 2021-10-06 ENCOUNTER — Encounter: Payer: Self-pay | Admitting: Emergency Medicine

## 2021-10-27 ENCOUNTER — Other Ambulatory Visit: Payer: Self-pay | Admitting: Family

## 2021-10-27 DIAGNOSIS — K2970 Gastritis, unspecified, without bleeding: Secondary | ICD-10-CM

## 2021-11-16 ENCOUNTER — Other Ambulatory Visit (HOSPITAL_COMMUNITY): Payer: Self-pay | Admitting: Cardiology

## 2021-11-16 DIAGNOSIS — I6523 Occlusion and stenosis of bilateral carotid arteries: Secondary | ICD-10-CM

## 2021-11-28 ENCOUNTER — Ambulatory Visit (HOSPITAL_COMMUNITY)
Admission: RE | Admit: 2021-11-28 | Discharge: 2021-11-28 | Disposition: A | Discharge status: Home / Self Care | Payer: Medicare Other | Source: Ambulatory Visit | Attending: Cardiology | Admitting: Cardiology

## 2021-11-28 DIAGNOSIS — I6523 Occlusion and stenosis of bilateral carotid arteries: Secondary | ICD-10-CM | POA: Diagnosis not present

## 2021-11-29 NOTE — Progress Notes (Signed)
Cardiology Office Note   Date:  11/30/2021   ID:  Matthew May, DOB 02-24-1946, MRN [id number]  PCP:  Sharion Balloon, FNP  Cardiologist:   Minus Breeding, MD   Chief Complaint  Patient presents with   Shortness of Breath      History of Present Illness: Matthew May is a 76 y.o. male who presents for followup after TAVR.  He has a history of CABG.  In July 2021 he had cath with occluded SVG-> LCx, patent sequential SVG -->PDA and PL, widely patent LIMA -->LAD, widely patent native left main. There was a patent LAD with competitive flow into the distal LAD from LIMA and severe diffuse disease in the mid to distal circumflex with functional total occlusion and collateralization of the second obtuse marginal from right to left collaterals. There was total occlusion mid RCA.   He had dizziness post TAVR last fall but a monitor demonstrated no arrhythmias.   He is doing yard work.  He rides a Therapist, music and weed eats. He had a normal TAVR function on echo in Aug 2022.    He gets around slowly with a walking stick.  He has chronic dyspnea.  He is not describing any new shortness of breath, PND or orthopnea.  He is not having any new palpitations, presyncope or syncope.  He does not like taking his diuretic every day says it does not really make a difference in how he breathes or feels.  He has some chronic lower extremity swelling.    Past Medical History:  Diagnosis Date   Atrial fibrillation ([place])    CAD (coronary artery disease)    a. s/p NSTEMI 4/11 => s/p CABG (L-LAD, S-OM2, S-PDA/PL);  b.  ETT-Myoview 6/14:  normal study, no ischemia, EF 67%   Carotid stenosis    Carotid U/S 4/96:  RICA 7-59%, LICA 16-38%; right vertebral flow retrograde suggestive of steel-consider PV consult   Cataract    COPD (chronic obstructive pulmonary disease) (HCC)    Diabetes mellitus without complication ([place])    HLD (hyperlipidemia)    HTN (hypertension)    Obesity    Renal insufficiency     Severe aortic stenosis    Subclavian artery stenosis, right ([place])    based upon carotid U/S done 11/2012    Past Surgical History:  Procedure Laterality Date   CATARACT EXTRACTION Left    CORONARY ARTERY BYPASS GRAFT     2011, LIMA to LAD coronary artery, SVG to OM2 branch of lect circumflex coronary artery, and a sequential SVG to psot descening to posterolateral branches to RCA   endoscopic vein harvesting     right leg    HERNIA REPAIR     RIGHT/LEFT HEART CATH AND CORONARY/GRAFT ANGIOGRAPHY N/A 12/31/2017   Procedure: RIGHT/LEFT HEART CATH AND CORONARY/GRAFT ANGIOGRAPHY;  Surgeon: Leonie Man, MD;  Location: [facility];  Service: Cardiovascular;  Laterality: N/A;   RIGHT/LEFT HEART CATH AND CORONARY/GRAFT ANGIOGRAPHY N/A 12/30/2019   Procedure: RIGHT/LEFT HEART CATH AND CORONARY/GRAFT ANGIOGRAPHY;  Surgeon: Belva Crome, MD;  Location: [facility];  Service: Cardiovascular;  Laterality: N/A;   TEE WITHOUT CARDIOVERSION N/A 01/20/2020   Procedure: TRANSESOPHAGEAL ECHOCARDIOGRAM (TEE);  Surgeon: [name], MD;  Location: [facility];  Service: Open Heart Surgery;  Laterality: N/A;   TONSILLECTOMY     TRANSCATHETER AORTIC VALVE REPLACEMENT, TRANSFEMORAL N/A 01/20/2020   Procedure: TRANSCATHETER AORTIC VALVE REPLACEMENT, TRANSFEMORALUSING EDWARDS SAPIEN 3 29 MM AORTIC THV.;  Surgeon: [name], MD;  Location: [facility];  Service: Open Heart Surgery;  Laterality: N/A;     Current Outpatient Medications  Medication Sig Dispense Refill   acetaminophen (TYLENOL) 325 MG tablet Take 2 tablets (650 mg total) by mouth every 6 (six) hours as needed for mild pain (or Fever >/= 101).     albuterol (VENTOLIN HFA) 108 (90 Base) MCG/ACT inhaler Inhale 2 puffs into the lungs every 6 (six) hours as needed for wheezing or shortness of breath. 8 g 0   apixaban (ELIQUIS) 5 MG TABS tablet Take 1 tablet by mouth twice daily 60 tablet 2   atorvastatin (LIPITOR) 40 MG tablet TAKE 1 TABLET  BY MOUTH DAILY 90 tablet 0   Budeson-Glycopyrrol-Formoterol (BREZTRI AEROSPHERE) 160-9-4.8 MCG/ACT AERO Inhale 2 puffs into the lungs 2 (two) times daily. 32.1 g 11   cetirizine (ZYRTEC) 10 MG tablet Take 1 tablet (10 mg total) by mouth daily. 90 tablet 1   cholecalciferol (VITAMIN D3) 25 MCG (1000 UT) tablet Take 1,000 Units by mouth daily.     colchicine 0.6 MG tablet TAKE 2 TABLETS BY MOUTH NOW, THEN TAKE 1 ADDITIONAL TABLET IN 1 HOUR. TAKE 1 TABLET ONCE DAILY THEREAFTER UNTIL SYMPTOMS RESOVE 30 tablet 1   dapagliflozin propanediol (FARXIGA) 10 MG TABS tablet Take 1 tablet (10 mg total) by mouth daily. 90 tablet 4   fluticasone (FLONASE) 50 MCG/ACT nasal spray Place 2 sprays into both nostrils daily. 16 g 6   glimepiride (AMARYL) 2 MG tablet Take 1 tablet by mouth once daily with breakfast 90 tablet 0   glucose blood (ONETOUCH VERIO) test strip CHECK BLOOD SUGAR DAILY Dx E11.9 100 strip 3   Lancets (ONETOUCH DELICA PLUS IZTIWP80D) MISC CHECK BLOOD SUGAR DAILY Dx E11.9 100 each 3   levothyroxine (SYNTHROID) 150 MCG tablet TAKE 1 TABLET BY MOUTH  DAILY 90 tablet 3   meclizine (ANTIVERT) 25 MG tablet TAKE 1 TABLET BY MOUTH THREE TIMES DAILY AS NEEDED FOR DIZZINESS 30 tablet 0   metFORMIN (GLUCOPHAGE) 1000 MG tablet TAKE 1 TABLET BY MOUTH TWICE  DAILY 180 tablet 0   nitroGLYCERIN (NITROSTAT) 0.4 MG SL tablet DISSOLVE ONE TABLET UNDER THE TONGUE EVERY 5 MINUTES AS NEEDED FOR CHEST PAIN.  DO NOT EXCEED A TOTAL OF 3 DOSES IN 15 MINUTES 25 tablet 0   ondansetron (ZOFRAN) 4 MG tablet Take 1 tablet (4 mg total) by mouth every 8 (eight) hours as needed for nausea or vomiting. 20 tablet 0   potassium chloride SA (KLOR-CON) 20 MEQ tablet TAKE 1 TABLET BY MOUTH  DAILY 90 tablet 3   valsartan (DIOVAN) 40 MG tablet Take 1 tablet by mouth once daily 90 tablet 3   torsemide (DEMADEX) 20 MG tablet TAKE 2 TABLETS BY MOUTH DAILY (Patient not taking: Reported on 11/30/2021) 180 tablet 0   No current  facility-administered medications for this visit.    Allergies:   Prednisone, Januvia [sitagliptin], Jardiance [empagliflozin], and Lisinopril    ROS:  Please see the history of present illness.   Otherwise, review of systems are positive for none.   All other systems are reviewed and negative.    PHYSICAL EXAM: VS:  BP (!) 147/70   Pulse 86   Ht 5' 6.5" (1.689 m)   Wt 250 lb (113.4 kg)   SpO2 96%   BMI 39.75 kg/m  , BMI Body mass index is 39.75 kg/m. GENERAL:  Well appearing NECK:  No jugular venous distention, waveform within normal limits,  carotid upstroke brisk and symmetric, no bruits, no thyromegaly LUNGS:  Clear to auscultation bilaterally CHEST:  Unremarkable HEART:  PMI not displaced or sustained,S1 and S2 within normal limits, no S3, no S4, no clicks, no rubs, apical systolic murmur, no diastolic murmurs ABD:  Flat, positive bowel sounds normal in frequency in pitch, no bruits, no rebound, no guarding, no midline pulsatile mass, no hepatomegaly, no splenomegaly EXT:  2 plus pulses throughout, mild to moderate bilateral lower extremity nonpitting edema, no cyanosis no clubbing   EKG:  EKG is ordered today. The ekg ordered today demonstrates normal sinus rhythm, rate 83, nonspecific ST-T wave changes   Recent Labs: 10/04/2021: ALT 40; BUN 10; Creatinine, Ser 0.91; Hemoglobin 14.0; Platelets 150; Potassium 4.5; Sodium 141; TSH 1.740    Lipid Panel    Component Value Date/Time   CHOL 129 11/11/2020 1044   CHOL 138 12/26/2012 1310   TRIG 147 11/11/2020 1044   TRIG 90 05/23/2013 1452   TRIG 108 12/26/2012 1310   HDL 26 (L) 11/11/2020 1044   HDL 34 (L) 05/23/2013 1452   HDL 33 (L) 12/26/2012 1310   CHOLHDL 5.0 11/11/2020 1044   CHOLHDL 5.9 10/01/2009 0700   VLDL 16 10/01/2009 0700   LDLCALC 77 11/11/2020 1044   LDLCALC 81 05/23/2013 1452   LDLCALC 83 12/26/2012 1310      Wt Readings from Last 3 Encounters:  11/30/21 250 lb (113.4 kg)  10/04/21 247 lb 3.2 oz  (112.1 kg)  08/04/21 245 lb (111.1 kg)      Other studies Reviewed: Additional studies/ records that were reviewed today include: Labs Review of the above records demonstrates:  Please see elsewhere in the note.     ASSESSMENT AND PLAN:  ATRIAL FIB:   Mr. Afshin Chrystal Cumpston has a CHA2DS2 - VASc score of 3.  He tolerates anticoagulation.  He seems to be maintaining sinus rhythm.  No change in therapy.  CORONARY ATHEROSCLEROSIS NATIVE CORONARY ARTERY - He has had no further chest discomfort.  He will continue with risk reduction.  HYPERTENSION -  The blood pressure is mildly elevated.  I would like him to keep a blood pressure diary.   DYSLIPIDEMIA -   LDL was 77 with an HDL of 26.  No change in therapy.  TAVR - This was stable on follow-up echo.   I will follow clinically.    CAROTID STENOSIS - He had right less than 69% stenosis and left no significant plaque in June 2022.  However, he just shows 1 to 39% bilateral stenosis.  No further imaging.  I reviewed this with him.  OBESITY - We have had long conversations about weight loss with diet and exercise.   DM - His last A1c was down to 7.1% which is down from 7.5.  We will continue the meds as listed.  Current medicines are reviewed at length with the patient today.  The patient does not have concerns regarding medicines.  The following changes have been made: None  Labs/ tests ordered today include: None  Orders Placed This Encounter  Procedures   EKG 12-Lead     Disposition:   FU with me 6 months   Signed, Minus Breeding, MD  11/30/2021 2:21 PM    [facility]

## 2021-11-30 ENCOUNTER — Ambulatory Visit (INDEPENDENT_AMBULATORY_CARE_PROVIDER_SITE_OTHER): Payer: Medicare Other | Admitting: Cardiology

## 2021-11-30 ENCOUNTER — Encounter: Payer: Self-pay | Admitting: Cardiology

## 2021-11-30 VITALS — BP 147/70 | HR 86 | Ht 66.5 in | Wt 250.0 lb

## 2021-11-30 DIAGNOSIS — E785 Hyperlipidemia, unspecified: Secondary | ICD-10-CM | POA: Diagnosis not present

## 2021-11-30 DIAGNOSIS — I480 Paroxysmal atrial fibrillation: Secondary | ICD-10-CM

## 2021-11-30 DIAGNOSIS — I10 Essential (primary) hypertension: Secondary | ICD-10-CM | POA: Diagnosis not present

## 2021-11-30 DIAGNOSIS — Z952 Presence of prosthetic heart valve: Secondary | ICD-10-CM | POA: Diagnosis not present

## 2021-11-30 DIAGNOSIS — I2510 Atherosclerotic heart disease of native coronary artery without angina pectoris: Secondary | ICD-10-CM

## 2021-11-30 DIAGNOSIS — E118 Type 2 diabetes mellitus with unspecified complications: Secondary | ICD-10-CM

## 2021-11-30 DIAGNOSIS — I6523 Occlusion and stenosis of bilateral carotid arteries: Secondary | ICD-10-CM | POA: Diagnosis not present

## 2021-11-30 NOTE — Patient Instructions (Signed)

## 2021-12-02 ENCOUNTER — Encounter: Payer: Self-pay | Admitting: *Deleted

## 2021-12-06 ENCOUNTER — Other Ambulatory Visit: Payer: Self-pay | Admitting: Family

## 2021-12-06 DIAGNOSIS — K2970 Gastritis, unspecified, without bleeding: Secondary | ICD-10-CM

## 2021-12-09 ENCOUNTER — Other Ambulatory Visit: Payer: Self-pay | Admitting: Family

## 2021-12-09 DIAGNOSIS — E1159 Type 2 diabetes mellitus with other circulatory complications: Secondary | ICD-10-CM

## 2021-12-09 DIAGNOSIS — R609 Edema, unspecified: Secondary | ICD-10-CM

## 2021-12-30 ENCOUNTER — Other Ambulatory Visit: Payer: Self-pay | Admitting: Family

## 2021-12-30 DIAGNOSIS — K2970 Gastritis, unspecified, without bleeding: Secondary | ICD-10-CM

## 2022-01-02 ENCOUNTER — Other Ambulatory Visit: Payer: Self-pay | Admitting: Family

## 2022-01-06 ENCOUNTER — Ambulatory Visit: Payer: Medicare Other | Admitting: Family

## 2022-01-11 ENCOUNTER — Encounter: Payer: Self-pay | Admitting: Family

## 2022-01-23 ENCOUNTER — Ambulatory Visit (INDEPENDENT_AMBULATORY_CARE_PROVIDER_SITE_OTHER): Payer: Medicare Other | Admitting: Family

## 2022-01-23 ENCOUNTER — Encounter: Payer: Self-pay | Admitting: Family

## 2022-01-23 VITALS — BP 138/69 | HR 94 | Temp 98.5°F | Ht 66.5 in | Wt 249.0 lb

## 2022-01-23 DIAGNOSIS — I739 Peripheral vascular disease, unspecified: Secondary | ICD-10-CM

## 2022-01-23 DIAGNOSIS — E559 Vitamin D deficiency, unspecified: Secondary | ICD-10-CM | POA: Diagnosis not present

## 2022-01-23 DIAGNOSIS — I480 Paroxysmal atrial fibrillation: Secondary | ICD-10-CM | POA: Diagnosis not present

## 2022-01-23 DIAGNOSIS — E038 Other specified hypothyroidism: Secondary | ICD-10-CM | POA: Diagnosis not present

## 2022-01-23 DIAGNOSIS — I771 Stricture of artery: Secondary | ICD-10-CM

## 2022-01-23 DIAGNOSIS — I2510 Atherosclerotic heart disease of native coronary artery without angina pectoris: Secondary | ICD-10-CM

## 2022-01-23 DIAGNOSIS — I10 Essential (primary) hypertension: Secondary | ICD-10-CM

## 2022-01-23 DIAGNOSIS — E1169 Type 2 diabetes mellitus with other specified complication: Secondary | ICD-10-CM

## 2022-01-23 DIAGNOSIS — J449 Chronic obstructive pulmonary disease, unspecified: Secondary | ICD-10-CM

## 2022-01-23 DIAGNOSIS — E785 Hyperlipidemia, unspecified: Secondary | ICD-10-CM | POA: Diagnosis not present

## 2022-01-23 DIAGNOSIS — R609 Edema, unspecified: Secondary | ICD-10-CM

## 2022-01-23 DIAGNOSIS — R600 Localized edema: Secondary | ICD-10-CM

## 2022-01-23 LAB — BAYER DCA HB A1C WAIVED: HB A1C (BAYER DCA - WAIVED): 7.6 % — ABNORMAL HIGH (ref 4.8–5.6)

## 2022-01-23 NOTE — Patient Instructions (Signed)
Peripheral Vascular Disease  Peripheral vascular disease (PVD) is a disease of the blood vessels. PVD may also be called peripheral artery disease (PAD) or poor circulation. PVD is the blocking or hardening of the arteries anywhere within the circulatory system beyond the heart. This can result in a decreased supply of blood to the arms, legs, and internal organs, such as the stomach or kidneys. However, PVD most often affects a person's lower legs and feet. Without treatment, PVD often worsens. PVD can lead to acute limb ischemia. This occurs when an arm or leg suddenly has trouble getting enough blood. This is a medical emergency. What are the causes? The most common cause of PVD is atherosclerosis. This is a buildup of fatty material and other substances (plaque)inside your arteries. Pieces of plaque can break off from the walls of an artery and become stuck in a smaller artery, blocking blood flow and possibly causing acute limb ischemia. Other common causes of PVD include: Blood clots that form inside the blood vessels. Injuries to blood vessels. Diseases that cause inflammation of blood vessels or cause blood vessel tightening (spasms). What increases the risk? The following factors may make you more likely to develop this condition: A family history of PVD. Common medical conditions, including: High cholesterol. Diabetes. High blood pressure (hypertension). Heart disease. Known atherosclerotic disease in another area of the body. Past injury, such as burns or a broken bone. Other medical conditions, such as: Buerger's disease. This is caused by inflamed blood vessels in your hands and feet. Some forms of arthritis. Birth defects that affect the arteries in your legs. Kidney disease. Using tobacco and nicotine products. Not getting enough exercise. Obesity. Being age 65 or older, or being age 50 or older and having the other risk factors. What are the signs or symptoms? This  condition may cause different symptoms. Your symptoms depend on what body part is not getting enough blood. Common signs and symptoms include: Cramps in your buttocks, legs, and feet. Intermittent claudication. This is pain and weakness in your legs during activity that resolves with rest. Leg pain at rest and leg numbness, tingling, or weakness. Coldness in a leg or foot, especially when compared to the other leg or foot. Skin or hair changes. These can include: Hair loss. Shiny skin. Pale or bluish skin. Thick toenails. Inability to get or maintain an erection (erectile dysfunction). Tiredness (fatigue). Weak pulse or no pulse in the feet. People with PVD are more likely to develop open wounds (ulcers) and sores on their toes, feet, or legs. The ulcers or sores may take longer than normal to heal. How is this diagnosed? PVD is diagnosed based on your signs and symptoms, a physical exam, and your medical history. You may also have other tests to find the cause. Tests include: Ankle-brachial index test.This test compares the blood pressure readings of the legs and arms. This may also include an exercise ankle-brachial index test in which you walk on a treadmill to check your symptoms. Doppler ultrasound. This takes pictures of blood flow through your blood vessels. Imaging studies that use dye to show blood flow. These are: CT angiogram. Magnetic resonance angiogram, or MRA. How is this treated? Treatment for PVD depends on the cause of your condition, how severe your symptoms are, and your age. Underlying causes need to be treated and controlled. These include long-term (chronic) conditions, such as diabetes, high cholesterol, and hypertension. Treatment may include: Lifestyle changes, such as: Quitting tobacco use. Exercising regularly. Following a   low-fat, low-cholesterol diet. Not drinking alcohol. Taking medicines, such as: Blood thinners to prevent blood clots. Medicines to  improve blood flow. Medicines to improve cholesterol levels. Procedures, such as: Angioplasty. This uses an inflated balloon to open a blocked artery and improve blood flow. Stent implant. This inserts a small mesh tube to keep a blocked artery open. Peripheral bypass surgery. This reroutes blood flow around a blocked artery. Surgery to remove dead tissue from an infected wound (debridement). Amputation. This is surgical removal of the affected limb. It may be necessary in cases of acute limb ischemia when medical or surgical treatments have not helped. Follow these instructions at home: Medicines Take over-the-counter and prescription medicines only as told by your health care provider. If you are taking blood thinners: Talk with your health care provider before you take any medicines that contain aspirin or NSAIDs, such as ibuprofen. These medicines increase your risk for dangerous bleeding. Take your medicine exactly as told, at the same time every day. Avoid activities that could cause injury or bruising, and follow instructions about how to prevent falls. Wear a medical alert bracelet or carry a card that lists what medicines you take. Lifestyle     Exercise regularly. Ask your health care provider about some good activities for you. Talk with your health care provider about maintaining a healthy weight. If needed, ask about losing weight. Eat a diet that is low in fat and cholesterol. If you need help, talk with your health care provider. Do not drink alcohol. Do not use any products that contain nicotine or tobacco. These products include cigarettes, chewing tobacco, and vaping devices, such as e-cigarettes. If you need help quitting, ask your health care provider. General instructions Take good care of your feet. To do this: Wear comfortable shoes that fit well. Check your feet often for any cuts or sores. Get an annual influenza vaccine. Keep all follow-up visits. This is  important. Where to find more information Society for Vascular Surgery: vascular.org American Heart Association: heart.org National Heart, Lung, and Blood Institute: nhlbi.nih.gov Contact a health care provider if: You have leg cramps while walking. You have leg pain when you rest. Your leg or foot feels cold. Your skin changes color. You have erectile dysfunction. You have cuts or sores on your legs or feet that do not heal. Get help right away if: You have sudden changes in color and feeling of your arms or legs, such as: Your arm or leg turns cold, numb, and blue. Your arm or leg becomes red, warm, swollen, painful, or numb. You have any symptoms of a stroke. "BE FAST" is an easy way to remember the main warning signs of a stroke: B - Balance. Signs are dizziness, sudden trouble walking, or loss of balance. E - Eyes. Signs are trouble seeing or a sudden change in vision. F - Face. Signs are sudden weakness or numbness of the face, or the face or eyelid drooping on one side. A - Arms. Signs are weakness or numbness in an arm. This happens suddenly and usually on one side of the body. S - Speech. Signs are sudden trouble speaking, slurred speech, or trouble understanding what people say. T - Time. Time to call emergency services. Write down what time symptoms started. You have other signs of a stroke, such as: A sudden, severe headache with no known cause. Nausea or vomiting. Seizure. You have chest pain or trouble breathing. These symptoms may represent a serious problem that is an emergency.   Do not wait to see if the symptoms will go away. Get medical help right away. Call your local emergency services (911 in the [place] Do not drive yourself to the hospital. Summary Peripheral vascular disease (PVD) is a disease of the blood vessels. PVD is the blocking or hardening of the arteries anywhere within the circulatory system beyond the heart. PVD may cause different symptoms. Your  symptoms depend on what part of your body is not getting enough blood. Treatment for PVD depends on what caused it, how severe your symptoms are, and your age. This information is not intended to replace advice given to you by your health care provider. Make sure you discuss any questions you have with your health care provider. Document Revised: 11/24/2019 Document Reviewed: 11/24/2019 Elsevier Patient Education  2023 Elsevier Inc.  

## 2022-01-23 NOTE — Progress Notes (Signed)
Subjective:    Patient ID: Matthew May, male    DOB: August 24, 1945, 76 y.o.   MRN: [id number]  Chief Complaint  Patient presents with   Medical Management of Chronic Issues   Pt calls the office today chronic follow up. Pt is followed by Cardiologists CAD, aortic stenosis, and A Fib. 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    He is moribid obese with a BMI of 39 and co moribitiy of DM and HTN.    He has COPD and states this is stable. Quit smoking in 2011. Hypertension This is a chronic problem. The current episode started more than 1 year ago. The problem has been resolved since onset. The problem is controlled. Associated symptoms include blurred vision, malaise/fatigue, peripheral edema and shortness of breath. Risk factors for coronary artery disease include dyslipidemia, diabetes mellitus, male gender, obesity and sedentary lifestyle. The current treatment provides moderate improvement. Hypertensive end-organ damage includes heart failure. Identifiable causes of hypertension include a thyroid problem.  Thyroid Problem Presents for follow-up visit. Symptoms include fatigue and hoarse voice. The symptoms have been stable. His past medical history is significant for heart failure and hyperlipidemia.  Hyperlipidemia This is a chronic problem. The current episode started more than 1 year ago. The problem is controlled. Exacerbating diseases include hypothyroidism. Associated symptoms include shortness of breath. Current antihyperlipidemic treatment includes statins. The current treatment provides moderate improvement of lipids. Risk factors for coronary artery disease include diabetes mellitus, dyslipidemia, male sex, hypertension and a sedentary lifestyle.  Diabetes He presents for his follow-up diabetic visit. He has type 2 diabetes mellitus. Associated symptoms include blurred vision, fatigue and foot paresthesias. Symptoms are stable. Diabetic complications include heart  disease and peripheral neuropathy. Risk factors for coronary artery disease include diabetes mellitus, dyslipidemia, hypertension, male sex and sedentary lifestyle. He is following a generally unhealthy diet. His overall blood glucose range is >200 mg/dl. Eye exam is current.      Review of Systems  Constitutional:  Positive for fatigue and malaise/fatigue.  HENT:  Positive for hoarse voice.   Eyes:  Positive for blurred vision.  Respiratory:  Positive for shortness of breath.        Objective:   Physical Exam Vitals reviewed.  Constitutional:      General: He is not in acute distress.    Appearance: He is well-developed. He is obese.  HENT:     Head: Normocephalic.     Right Ear: Tympanic membrane normal.     Left Ear: Tympanic membrane normal.  Eyes:     General:        Right eye: No discharge.        Left eye: No discharge.     Pupils: Pupils are equal, round, and reactive to light.  Neck:     Thyroid: No thyromegaly.  Cardiovascular:     Rate and Rhythm: Normal rate and regular rhythm.     Heart sounds: Normal heart sounds. No murmur heard. Pulmonary:     Effort: Pulmonary effort is normal. No respiratory distress.     Breath sounds: Normal breath sounds. No wheezing.  Abdominal:     General: Bowel sounds are normal. There is no distension.     Palpations: Abdomen is soft.     Tenderness: There is no abdominal tenderness.  Musculoskeletal:        General: No tenderness. Normal range of motion.     Cervical back: Normal range of motion and neck supple.  Right lower leg: Edema (2+) present.     Left lower leg: Edema (2+) present.     Comments: Bilateral legs discoloration   Skin:    General: Skin is warm and dry.     Findings: No erythema or rash.  Neurological:     Mental Status: He is alert and oriented to person, place, and time.     Cranial Nerves: No cranial nerve deficit.     Deep Tendon Reflexes: Reflexes are normal and symmetric.  Psychiatric:         Behavior: Behavior normal.        Thought Content: Thought content normal.        Judgment: Judgment normal.          BP 102/65   Pulse 94   Temp 98.5 F (36.9 C) (Temporal)   Ht 5' 6.5" (1.689 m)   Wt 249 lb (112.9 kg)   BMI 39.59 kg/m   Assessment & Plan:  Matthew May comes in today with chief complaint of Medical Management of Chronic Issues   Diagnosis and orders addressed:  1. Atherosclerosis of native coronary artery of native heart without angina pectoris - CMP14+EGFR - CBC with Differential/Platelet - Compression stockings  2. Primary hypertension - CMP14+EGFR - CBC with Differential/Platelet  3. Paroxysmal atrial fibrillation (HCC)  - CMP14+EGFR - CBC with Differential/Platelet  4. Subclavian artery stenosis, right (HCC) - CMP14+EGFR - CBC with Differential/Platelet  5. Chronic obstructive pulmonary disease, unspecified COPD type ([place])  - CMP14+EGFR - CBC with Differential/Platelet  6. Type 2 diabetes mellitus with other specified complication, without long-term current use of insulin (HCC) - Bayer DCA Hb A1c Waived - CMP14+EGFR - CBC with Differential/Platelet - Microalbumin / creatinine urine ratio  7. Hyperlipidemia associated with type 2 diabetes mellitus (HCC)  - CMP14+EGFR - CBC with Differential/Platelet  8. Other specified hypothyroidism - CMP14+EGFR - CBC with Differential/Platelet  9. Morbid obesity ([place]) - CMP14+EGFR - CBC with Differential/Platelet  10. Vitamin D deficiency - CMP14+EGFR - CBC with Differential/Platelet  11. Peripheral edema  - Compression stockings  12. PAD (peripheral artery disease) (HCC) - Compression stockings  Labs pending Health Maintenance reviewed Diet and exercise encouraged  Follow up plan: 3 months   Evelina Dun, FNP

## 2022-01-24 LAB — CMP14+EGFR
ALT: 45 IU/L — ABNORMAL HIGH (ref 0–44)
AST: 40 IU/L (ref 0–40)
Albumin/Globulin Ratio: 1.4 (ref 1.2–2.2)
Albumin: 4.2 g/dL (ref 3.8–4.8)
Alkaline Phosphatase: 84 IU/L (ref 44–121)
BUN/Creatinine Ratio: 13 (ref 10–24)
BUN: 12 mg/dL (ref 8–27)
Bilirubin Total: 0.4 mg/dL (ref 0.0–1.2)
CO2: 22 mmol/L (ref 20–29)
Calcium: 9.1 mg/dL (ref 8.6–10.2)
Chloride: 98 mmol/L (ref 96–106)
Creatinine, Ser: 0.89 mg/dL (ref 0.76–1.27)
Globulin, Total: 3 g/dL (ref 1.5–4.5)
Glucose: 253 mg/dL — ABNORMAL HIGH (ref 70–99)
Potassium: 4.4 mmol/L (ref 3.5–5.2)
Sodium: 138 mmol/L (ref 134–144)
Total Protein: 7.2 g/dL (ref 6.0–8.5)
eGFR: 90 mL/min/{1.73_m2} (ref >59)

## 2022-01-24 LAB — CBC WITH DIFFERENTIAL/PLATELET
Basophils Absolute: 0.1 10*3/uL (ref 0.0–0.2)
Basos: 1 %
EOS (ABSOLUTE): 0.2 10*3/uL (ref 0.0–0.4)
Eos: 2 %
Hematocrit: 42.1 % (ref 37.5–51.0)
Hemoglobin: 13.9 g/dL (ref 13.0–17.7)
Immature Grans (Abs): 0.1 10*3/uL (ref 0.0–0.1)
Immature Granulocytes: 1 %
Lymphocytes Absolute: 1.7 10*3/uL (ref 0.7–3.1)
Lymphs: 18 %
MCH: 29.7 pg (ref 26.6–33.0)
MCHC: 33 g/dL (ref 31.5–35.7)
MCV: 90 fL (ref 79–97)
Monocytes Absolute: 0.4 10*3/uL (ref 0.1–0.9)
Monocytes: 4 %
Neutrophils Absolute: 7 10*3/uL (ref 1.4–7.0)
Neutrophils: 74 %
Platelets: 149 10*3/uL — ABNORMAL LOW (ref 150–450)
RBC: 4.68 x10E6/uL (ref 4.14–5.80)
RDW: 12.9 % (ref 11.6–15.4)
WBC: 9.5 10*3/uL (ref 3.4–10.8)

## 2022-01-24 LAB — MICROALBUMIN / CREATININE URINE RATIO
Creatinine, Urine: 37.5 mg/dL
Microalb/Creat Ratio: 35 mg/g creat — ABNORMAL HIGH (ref 0–29)
Microalbumin, Urine: 13 ug/mL

## 2022-01-25 ENCOUNTER — Encounter: Payer: Self-pay | Admitting: Family Medicine

## 2022-01-25 ENCOUNTER — Other Ambulatory Visit: Payer: Self-pay | Admitting: Family

## 2022-01-25 DIAGNOSIS — K2970 Gastritis, unspecified, without bleeding: Secondary | ICD-10-CM

## 2022-01-25 NOTE — Telephone Encounter (Signed)
Last office visit 01/24/31 Last refill 01/02/22, #30, no refills

## 2022-01-30 ENCOUNTER — Other Ambulatory Visit: Payer: Self-pay | Admitting: Family

## 2022-02-22 ENCOUNTER — Other Ambulatory Visit: Payer: Self-pay | Admitting: Family

## 2022-02-22 DIAGNOSIS — K2970 Gastritis, unspecified, without bleeding: Secondary | ICD-10-CM

## 2022-02-28 ENCOUNTER — Other Ambulatory Visit: Payer: Self-pay | Admitting: Family

## 2022-02-28 DIAGNOSIS — J449 Chronic obstructive pulmonary disease, unspecified: Secondary | ICD-10-CM

## 2022-03-01 ENCOUNTER — Other Ambulatory Visit: Payer: Self-pay | Admitting: Cardiology

## 2022-03-01 ENCOUNTER — Other Ambulatory Visit: Payer: Self-pay | Admitting: Family

## 2022-03-08 ENCOUNTER — Other Ambulatory Visit: Payer: Self-pay | Admitting: Family

## 2022-04-10 ENCOUNTER — Other Ambulatory Visit: Payer: Self-pay | Admitting: Family

## 2022-04-10 DIAGNOSIS — J449 Chronic obstructive pulmonary disease, unspecified: Secondary | ICD-10-CM

## 2022-04-26 ENCOUNTER — Other Ambulatory Visit: Payer: Self-pay | Admitting: Family

## 2022-04-29 ENCOUNTER — Other Ambulatory Visit: Payer: Self-pay | Admitting: Family

## 2022-05-02 ENCOUNTER — Encounter: Payer: Self-pay | Admitting: Family

## 2022-05-02 ENCOUNTER — Ambulatory Visit: Payer: Medicare Other | Admitting: Family

## 2022-05-10 ENCOUNTER — Other Ambulatory Visit: Payer: Self-pay | Admitting: Family

## 2022-06-07 ENCOUNTER — Other Ambulatory Visit: Payer: Self-pay

## 2022-06-07 ENCOUNTER — Emergency Department (HOSPITAL_COMMUNITY): Payer: Medicare Other

## 2022-06-07 ENCOUNTER — Inpatient Hospital Stay (HOSPITAL_COMMUNITY)
Admission: EM | Admit: 2022-06-07 | Discharge: 2022-06-10 | DRG: 291 | Disposition: A | Discharge status: Home Health | Payer: Medicare Other | Attending: Internal Medicine | Admitting: Internal Medicine

## 2022-06-07 DIAGNOSIS — T380X5A Adverse effect of glucocorticoids and synthetic analogues, initial encounter: Secondary | ICD-10-CM | POA: Diagnosis present

## 2022-06-07 DIAGNOSIS — I10 Essential (primary) hypertension: Secondary | ICD-10-CM | POA: Diagnosis not present

## 2022-06-07 DIAGNOSIS — Z1152 Encounter for screening for COVID-19: Secondary | ICD-10-CM | POA: Diagnosis not present

## 2022-06-07 DIAGNOSIS — Z7901 Long term (current) use of anticoagulants: Secondary | ICD-10-CM | POA: Diagnosis not present

## 2022-06-07 DIAGNOSIS — E119 Type 2 diabetes mellitus without complications: Secondary | ICD-10-CM

## 2022-06-07 DIAGNOSIS — E785 Hyperlipidemia, unspecified: Secondary | ICD-10-CM | POA: Diagnosis not present

## 2022-06-07 DIAGNOSIS — I878 Other specified disorders of veins: Secondary | ICD-10-CM | POA: Diagnosis present

## 2022-06-07 DIAGNOSIS — Z79899 Other long term (current) drug therapy: Secondary | ICD-10-CM

## 2022-06-07 DIAGNOSIS — J441 Chronic obstructive pulmonary disease with (acute) exacerbation: Secondary | ICD-10-CM | POA: Diagnosis not present

## 2022-06-07 DIAGNOSIS — Z951 Presence of aortocoronary bypass graft: Secondary | ICD-10-CM | POA: Diagnosis not present

## 2022-06-07 DIAGNOSIS — J9601 Acute respiratory failure with hypoxia: Secondary | ICD-10-CM | POA: Diagnosis not present

## 2022-06-07 DIAGNOSIS — E038 Other specified hypothyroidism: Secondary | ICD-10-CM | POA: Diagnosis not present

## 2022-06-07 DIAGNOSIS — I350 Nonrheumatic aortic (valve) stenosis: Secondary | ICD-10-CM

## 2022-06-07 DIAGNOSIS — I110 Hypertensive heart disease with heart failure: Secondary | ICD-10-CM | POA: Diagnosis not present

## 2022-06-07 DIAGNOSIS — Z952 Presence of prosthetic heart valve: Secondary | ICD-10-CM

## 2022-06-07 DIAGNOSIS — E1165 Type 2 diabetes mellitus with hyperglycemia: Secondary | ICD-10-CM | POA: Diagnosis present

## 2022-06-07 DIAGNOSIS — Z7984 Long term (current) use of oral hypoglycemic drugs: Secondary | ICD-10-CM

## 2022-06-07 DIAGNOSIS — Z87891 Personal history of nicotine dependence: Secondary | ICD-10-CM

## 2022-06-07 DIAGNOSIS — Z8249 Family history of ischemic heart disease and other diseases of the circulatory system: Secondary | ICD-10-CM | POA: Diagnosis not present

## 2022-06-07 DIAGNOSIS — I5031 Acute diastolic (congestive) heart failure: Secondary | ICD-10-CM | POA: Diagnosis not present

## 2022-06-07 DIAGNOSIS — I2510 Atherosclerotic heart disease of native coronary artery without angina pectoris: Secondary | ICD-10-CM | POA: Diagnosis present

## 2022-06-07 DIAGNOSIS — E1159 Type 2 diabetes mellitus with other circulatory complications: Secondary | ICD-10-CM

## 2022-06-07 DIAGNOSIS — R600 Localized edema: Secondary | ICD-10-CM

## 2022-06-07 DIAGNOSIS — Z888 Allergy status to other drugs, medicaments and biological substances status: Secondary | ICD-10-CM | POA: Diagnosis not present

## 2022-06-07 DIAGNOSIS — E1169 Type 2 diabetes mellitus with other specified complication: Secondary | ICD-10-CM

## 2022-06-07 DIAGNOSIS — J189 Pneumonia, unspecified organism: Secondary | ICD-10-CM

## 2022-06-07 DIAGNOSIS — R609 Edema, unspecified: Secondary | ICD-10-CM

## 2022-06-07 DIAGNOSIS — I480 Paroxysmal atrial fibrillation: Secondary | ICD-10-CM | POA: Diagnosis not present

## 2022-06-07 DIAGNOSIS — Z66 Do not resuscitate: Secondary | ICD-10-CM | POA: Diagnosis not present

## 2022-06-07 DIAGNOSIS — I152 Hypertension secondary to endocrine disorders: Secondary | ICD-10-CM

## 2022-06-07 DIAGNOSIS — J90 Pleural effusion, not elsewhere classified: Secondary | ICD-10-CM | POA: Diagnosis not present

## 2022-06-07 DIAGNOSIS — J440 Chronic obstructive pulmonary disease with acute lower respiratory infection: Secondary | ICD-10-CM | POA: Diagnosis present

## 2022-06-07 DIAGNOSIS — J449 Chronic obstructive pulmonary disease, unspecified: Secondary | ICD-10-CM | POA: Diagnosis present

## 2022-06-07 DIAGNOSIS — I5033 Acute on chronic diastolic (congestive) heart failure: Secondary | ICD-10-CM | POA: Diagnosis present

## 2022-06-07 DIAGNOSIS — Z818 Family history of other mental and behavioral disorders: Secondary | ICD-10-CM

## 2022-06-07 DIAGNOSIS — Z6839 Body mass index (BMI) 39.0-39.9, adult: Secondary | ICD-10-CM

## 2022-06-07 DIAGNOSIS — I4510 Unspecified right bundle-branch block: Secondary | ICD-10-CM | POA: Diagnosis present

## 2022-06-07 DIAGNOSIS — R0602 Shortness of breath: Secondary | ICD-10-CM | POA: Diagnosis not present

## 2022-06-07 DIAGNOSIS — Z833 Family history of diabetes mellitus: Secondary | ICD-10-CM

## 2022-06-07 DIAGNOSIS — E039 Hypothyroidism, unspecified: Secondary | ICD-10-CM | POA: Diagnosis present

## 2022-06-07 LAB — CBC WITH DIFFERENTIAL/PLATELET
Abs Immature Granulocytes: 0.03 10*3/uL (ref 0.00–0.07)
Basophils Absolute: 0 10*3/uL (ref 0.0–0.1)
Basophils Relative: 0 %
Eosinophils Absolute: 0.3 10*3/uL (ref 0.0–0.5)
Eosinophils Relative: 3 %
HCT: 45.1 % (ref 39.0–52.0)
Hemoglobin: 14.2 g/dL (ref 13.0–17.0)
Immature Granulocytes: 0 %
Lymphocytes Relative: 18 %
Lymphs Abs: 1.8 10*3/uL (ref 0.7–4.0)
MCH: 29.8 pg (ref 26.0–34.0)
MCHC: 31.5 g/dL (ref 30.0–36.0)
MCV: 94.7 fL (ref 80.0–100.0)
Monocytes Absolute: 0.5 10*3/uL (ref 0.1–1.0)
Monocytes Relative: 5 %
Neutro Abs: 6.9 10*3/uL (ref 1.7–7.7)
Neutrophils Relative %: 74 %
Platelets: 214 10*3/uL (ref 150–400)
RBC: 4.76 MIL/uL (ref 4.22–5.81)
RDW: 12.7 % (ref 11.5–15.5)
WBC: 9.5 10*3/uL (ref 4.0–10.5)
nRBC: 0 % (ref 0.0–0.2)

## 2022-06-07 LAB — RESP PANEL BY RT-PCR (RSV, FLU A&B, COVID)  RVPGX2
Influenza A by PCR: NEGATIVE
Influenza B by PCR: NEGATIVE
Resp Syncytial Virus by PCR: NEGATIVE
SARS Coronavirus 2 by RT PCR: NEGATIVE

## 2022-06-07 LAB — BRAIN NATRIURETIC PEPTIDE: B Natriuretic Peptide: 44.6 pg/mL (ref 0.0–100.0)

## 2022-06-07 NOTE — ED Triage Notes (Signed)
Patient here with complaint of shortness of breath that started a few days ago. Patient denies chest pain, denies cough, denies nausea and vomiting. Patient is alert, oriented, and in no apparent distress a this time.

## 2022-06-07 NOTE — ED Provider Triage Note (Signed)
Emergency Medicine Provider Triage Evaluation Note  Matthew May , a 77 y.o. male  was evaluated in triage.  Pt complains of shortness of breath.  Patient reports he has had increased work of breathing for the last several days after having a cold several weeks ago.  Patient does not report a history of heart failure to me but states that his lower extremities have become edematous.  Denies fever, chest pain, abdominal pain, nausea, vomiting..  Review of Systems  Positive: As above Negative: As above  Physical Exam  There were no vitals taken for this visit. Gen:   Awake, no distress   Resp:  Normal effort, diffuse wheezing MSK:   Moves extremities without difficulty Other:  Lower extremities are tight with some pitting edema  Medical Decision Making  Medically screening exam initiated at 2:52 PM.  Appropriate orders placed.  Matthew May was informed that the remainder of the evaluation will be completed by another provider, this initial triage assessment does not replace that evaluation, and the importance of remaining in the ED until their evaluation is complete.     [name], PA-C 06/07/22 1500

## 2022-06-07 NOTE — ED Notes (Signed)
Failed attempt to collect labs   

## 2022-06-08 ENCOUNTER — Inpatient Hospital Stay (HOSPITAL_COMMUNITY): Payer: Medicare Other

## 2022-06-08 ENCOUNTER — Other Ambulatory Visit: Payer: Self-pay

## 2022-06-08 ENCOUNTER — Ambulatory Visit: Payer: Medicare Other | Admitting: Family Medicine

## 2022-06-08 DIAGNOSIS — I2510 Atherosclerotic heart disease of native coronary artery without angina pectoris: Secondary | ICD-10-CM | POA: Diagnosis present

## 2022-06-08 DIAGNOSIS — E038 Other specified hypothyroidism: Secondary | ICD-10-CM

## 2022-06-08 DIAGNOSIS — J9601 Acute respiratory failure with hypoxia: Principal | ICD-10-CM | POA: Diagnosis present

## 2022-06-08 DIAGNOSIS — Z818 Family history of other mental and behavioral disorders: Secondary | ICD-10-CM | POA: Diagnosis not present

## 2022-06-08 DIAGNOSIS — E1169 Type 2 diabetes mellitus with other specified complication: Secondary | ICD-10-CM | POA: Diagnosis present

## 2022-06-08 DIAGNOSIS — I110 Hypertensive heart disease with heart failure: Secondary | ICD-10-CM | POA: Diagnosis present

## 2022-06-08 DIAGNOSIS — T380X5A Adverse effect of glucocorticoids and synthetic analogues, initial encounter: Secondary | ICD-10-CM | POA: Diagnosis present

## 2022-06-08 DIAGNOSIS — I5031 Acute diastolic (congestive) heart failure: Secondary | ICD-10-CM | POA: Diagnosis not present

## 2022-06-08 DIAGNOSIS — E039 Hypothyroidism, unspecified: Secondary | ICD-10-CM | POA: Diagnosis present

## 2022-06-08 DIAGNOSIS — Z952 Presence of prosthetic heart valve: Secondary | ICD-10-CM | POA: Diagnosis not present

## 2022-06-08 DIAGNOSIS — I480 Paroxysmal atrial fibrillation: Secondary | ICD-10-CM | POA: Diagnosis not present

## 2022-06-08 DIAGNOSIS — Z951 Presence of aortocoronary bypass graft: Secondary | ICD-10-CM | POA: Diagnosis not present

## 2022-06-08 DIAGNOSIS — Z7901 Long term (current) use of anticoagulants: Secondary | ICD-10-CM | POA: Diagnosis not present

## 2022-06-08 DIAGNOSIS — R600 Localized edema: Secondary | ICD-10-CM | POA: Diagnosis present

## 2022-06-08 DIAGNOSIS — Z6839 Body mass index (BMI) 39.0-39.9, adult: Secondary | ICD-10-CM | POA: Diagnosis not present

## 2022-06-08 DIAGNOSIS — J441 Chronic obstructive pulmonary disease with (acute) exacerbation: Secondary | ICD-10-CM

## 2022-06-08 DIAGNOSIS — Z87891 Personal history of nicotine dependence: Secondary | ICD-10-CM | POA: Diagnosis not present

## 2022-06-08 DIAGNOSIS — I5033 Acute on chronic diastolic (congestive) heart failure: Secondary | ICD-10-CM | POA: Insufficient documentation

## 2022-06-08 DIAGNOSIS — I350 Nonrheumatic aortic (valve) stenosis: Secondary | ICD-10-CM | POA: Diagnosis not present

## 2022-06-08 DIAGNOSIS — J440 Chronic obstructive pulmonary disease with acute lower respiratory infection: Secondary | ICD-10-CM | POA: Diagnosis present

## 2022-06-08 DIAGNOSIS — J189 Pneumonia, unspecified organism: Secondary | ICD-10-CM | POA: Diagnosis present

## 2022-06-08 DIAGNOSIS — E785 Hyperlipidemia, unspecified: Secondary | ICD-10-CM | POA: Diagnosis present

## 2022-06-08 DIAGNOSIS — E1165 Type 2 diabetes mellitus with hyperglycemia: Secondary | ICD-10-CM | POA: Diagnosis present

## 2022-06-08 DIAGNOSIS — Z1152 Encounter for screening for COVID-19: Secondary | ICD-10-CM | POA: Diagnosis not present

## 2022-06-08 DIAGNOSIS — Z66 Do not resuscitate: Secondary | ICD-10-CM | POA: Diagnosis present

## 2022-06-08 DIAGNOSIS — Z888 Allergy status to other drugs, medicaments and biological substances status: Secondary | ICD-10-CM | POA: Diagnosis not present

## 2022-06-08 DIAGNOSIS — Z8249 Family history of ischemic heart disease and other diseases of the circulatory system: Secondary | ICD-10-CM | POA: Diagnosis not present

## 2022-06-08 LAB — ECHOCARDIOGRAM COMPLETE
AR max vel: 3.66 cm2
AV Area VTI: 3.6 cm2
AV Area mean vel: 3.06 cm2
AV Mean grad: 7 mmHg
AV Peak grad: 10.3 mmHg
Ao pk vel: 1.61 m/s
Area-P 1/2: 3.37 cm2
Calc EF: 73.9 %
MV VTI: 3.28 cm2
S' Lateral: 2.4 cm
Single Plane A2C EF: 76.4 %
Single Plane A4C EF: 71.3 %

## 2022-06-08 LAB — COMPREHENSIVE METABOLIC PANEL
ALT: 34 U/L (ref 0–44)
AST: 35 U/L (ref 15–41)
Albumin: 3.3 g/dL — ABNORMAL LOW (ref 3.5–5.0)
Alkaline Phosphatase: 74 U/L (ref 38–126)
Anion gap: 10 (ref 5–15)
BUN: 12 mg/dL (ref 8–23)
CO2: 31 mmol/L (ref 22–32)
Calcium: 8.8 mg/dL — ABNORMAL LOW (ref 8.9–10.3)
Chloride: 97 mmol/L — ABNORMAL LOW (ref 98–111)
Creatinine, Ser: 0.85 mg/dL (ref 0.61–1.24)
GFR, Estimated: >60 mL/min (ref >60)
Glucose, Bld: 250 mg/dL — ABNORMAL HIGH (ref 70–99)
Potassium: 4 mmol/L (ref 3.5–5.1)
Sodium: 138 mmol/L (ref 135–145)
Total Bilirubin: 0.6 mg/dL (ref 0.3–1.2)
Total Protein: 7.5 g/dL (ref 6.5–8.1)

## 2022-06-08 LAB — CBG MONITORING, ED: Glucose-Capillary: 236 mg/dL — ABNORMAL HIGH (ref 70–99)

## 2022-06-08 LAB — PROCALCITONIN: Procalcitonin: <0.1 ng/mL

## 2022-06-08 LAB — HEMOGLOBIN A1C
Hgb A1c MFr Bld: 7.2 % — ABNORMAL HIGH (ref 4.8–5.6)
Mean Plasma Glucose: 160 mg/dL

## 2022-06-08 LAB — GLUCOSE, CAPILLARY: Glucose-Capillary: 372 mg/dL — ABNORMAL HIGH (ref 70–99)

## 2022-06-08 MED ORDER — MECLIZINE HCL 25 MG PO TABS: 25.0000 mg | ORAL_TABLET | Freq: Three times a day (TID) | ORAL | Status: DC | PRN

## 2022-06-08 MED ORDER — IRBESARTAN 75 MG PO TABS: 75.0000 mg | ORAL_TABLET | Freq: Every day | ORAL | Status: DC

## 2022-06-08 MED ORDER — ALBUTEROL SULFATE (2.5 MG/3ML) 0.083% IN NEBU: 2.5000 mg | INHALATION_SOLUTION | RESPIRATORY_TRACT | Status: DC | PRN

## 2022-06-08 MED ORDER — METHYLPREDNISOLONE SODIUM SUCC 125 MG IJ SOLR
125.0000 mg | Freq: Once | INTRAMUSCULAR | Status: AC
  Administered 2022-06-08: 125 mg via INTRAVENOUS
  Filled 2022-06-08: qty 2

## 2022-06-08 MED ORDER — SODIUM CHLORIDE 0.9 % IV SOLN
500.0000 mg | Freq: Once | INTRAVENOUS | Status: AC
  Administered 2022-06-08: 500 mg via INTRAVENOUS
  Filled 2022-06-08: qty 5

## 2022-06-08 MED ORDER — SODIUM CHLORIDE 0.9 % IV SOLN
2.0000 g | INTRAVENOUS | Status: DC
  Administered 2022-06-09 – 2022-06-10 (×2): 2 g via INTRAVENOUS
  Filled 2022-06-08 (×2): qty 20

## 2022-06-08 MED ORDER — FLUTICASONE PROPIONATE 50 MCG/ACT NA SUSP
2.0000 | Freq: Every day | NASAL | Status: DC
  Administered 2022-06-09 – 2022-06-10 (×2): 2 via NASAL
  Filled 2022-06-08: qty 16

## 2022-06-08 MED ORDER — DAPAGLIFLOZIN PROPANEDIOL 10 MG PO TABS
10.0000 mg | ORAL_TABLET | Freq: Every day | ORAL | Status: DC
  Administered 2022-06-08 – 2022-06-10 (×3): 10 mg via ORAL
  Filled 2022-06-08 (×3): qty 1

## 2022-06-08 MED ORDER — IPRATROPIUM-ALBUTEROL 0.5-2.5 (3) MG/3ML IN SOLN
3.0000 mL | Freq: Once | RESPIRATORY_TRACT | Status: AC
  Administered 2022-06-08: 3 mL via RESPIRATORY_TRACT
  Filled 2022-06-08: qty 3

## 2022-06-08 MED ORDER — UMECLIDINIUM BROMIDE 62.5 MCG/ACT IN AEPB
1.0000 | INHALATION_SPRAY | Freq: Every day | RESPIRATORY_TRACT | Status: DC
  Administered 2022-06-09 – 2022-06-10 (×2): 1 via RESPIRATORY_TRACT
  Filled 2022-06-08: qty 7

## 2022-06-08 MED ORDER — ACETAMINOPHEN 650 MG RE SUPP: 650.0000 mg | Freq: Four times a day (QID) | RECTAL | Status: DC | PRN

## 2022-06-08 MED ORDER — SODIUM CHLORIDE 0.9 % IV SOLN: 250.0000 mL | INTRAVENOUS | Status: DC | PRN

## 2022-06-08 MED ORDER — INSULIN ASPART 100 UNIT/ML IJ SOLN
0.0000 [IU] | Freq: Three times a day (TID) | INTRAMUSCULAR | Status: DC
  Administered 2022-06-08 – 2022-06-09 (×2): 3 [IU] via SUBCUTANEOUS

## 2022-06-08 MED ORDER — POTASSIUM CHLORIDE CRYS ER 20 MEQ PO TBCR
20.0000 meq | EXTENDED_RELEASE_TABLET | Freq: Every day | ORAL | Status: DC
  Administered 2022-06-08 – 2022-06-10 (×3): 20 meq via ORAL
  Filled 2022-06-08 (×3): qty 1

## 2022-06-08 MED ORDER — POLYETHYLENE GLYCOL 3350 17 G PO PACK: 17.0000 g | PACK | Freq: Every day | ORAL | Status: DC | PRN

## 2022-06-08 MED ORDER — SODIUM CHLORIDE 0.9 % IV SOLN
1.0000 g | Freq: Once | INTRAVENOUS | Status: AC
  Administered 2022-06-08: 1 g via INTRAVENOUS
  Filled 2022-06-08: qty 10

## 2022-06-08 MED ORDER — ATORVASTATIN CALCIUM 40 MG PO TABS
40.0000 mg | ORAL_TABLET | Freq: Every day | ORAL | Status: DC
  Administered 2022-06-08 – 2022-06-10 (×3): 40 mg via ORAL
  Filled 2022-06-08 (×3): qty 1

## 2022-06-08 MED ORDER — FLUTICASONE FUROATE-VILANTEROL 200-25 MCG/ACT IN AEPB
1.0000 | INHALATION_SPRAY | Freq: Two times a day (BID) | RESPIRATORY_TRACT | Status: DC
  Administered 2022-06-09 – 2022-06-10 (×3): 1 via RESPIRATORY_TRACT
  Filled 2022-06-08: qty 28

## 2022-06-08 MED ORDER — SODIUM CHLORIDE 0.9% FLUSH
3.0000 mL | Freq: Two times a day (BID) | INTRAVENOUS | Status: DC
  Administered 2022-06-08 – 2022-06-10 (×5): 3 mL via INTRAVENOUS

## 2022-06-08 MED ORDER — ONDANSETRON HCL 4 MG PO TABS: 4.0000 mg | ORAL_TABLET | Freq: Four times a day (QID) | ORAL | Status: DC | PRN

## 2022-06-08 MED ORDER — BUDESON-GLYCOPYRROL-FORMOTEROL 160-9-4.8 MCG/ACT IN AERO: 2.0000 | INHALATION_SPRAY | Freq: Two times a day (BID) | RESPIRATORY_TRACT | Status: DC

## 2022-06-08 MED ORDER — SODIUM CHLORIDE 0.9% FLUSH: 3.0000 mL | INTRAVENOUS | Status: DC | PRN

## 2022-06-08 MED ORDER — LORATADINE 10 MG PO TABS
10.0000 mg | ORAL_TABLET | Freq: Every day | ORAL | Status: DC
  Administered 2022-06-08 – 2022-06-10 (×3): 10 mg via ORAL
  Filled 2022-06-08 (×3): qty 1

## 2022-06-08 MED ORDER — APIXABAN 5 MG PO TABS
5.0000 mg | ORAL_TABLET | Freq: Two times a day (BID) | ORAL | Status: DC
  Administered 2022-06-09 – 2022-06-10 (×3): 5 mg via ORAL
  Filled 2022-06-08 (×4): qty 1

## 2022-06-08 MED ORDER — IPRATROPIUM-ALBUTEROL 0.5-2.5 (3) MG/3ML IN SOLN
3.0000 mL | Freq: Three times a day (TID) | RESPIRATORY_TRACT | Status: DC
  Administered 2022-06-08 – 2022-06-09 (×3): 3 mL via RESPIRATORY_TRACT
  Filled 2022-06-08 (×3): qty 3

## 2022-06-08 MED ORDER — GUAIFENESIN ER 600 MG PO TB12
600.0000 mg | ORAL_TABLET | Freq: Two times a day (BID) | ORAL | Status: DC
  Administered 2022-06-08 – 2022-06-10 (×4): 600 mg via ORAL
  Filled 2022-06-08 (×5): qty 1

## 2022-06-08 MED ORDER — PERFLUTREN LIPID MICROSPHERE
1.0000 mL | INTRAVENOUS | Status: AC | PRN
  Administered 2022-06-08: 2 mL via INTRAVENOUS

## 2022-06-08 MED ORDER — SODIUM CHLORIDE 0.9 % IV SOLN
500.0000 mg | INTRAVENOUS | Status: DC
  Filled 2022-06-08: qty 5

## 2022-06-08 MED ORDER — METHYLPREDNISOLONE SODIUM SUCC 40 MG IJ SOLR
40.0000 mg | Freq: Two times a day (BID) | INTRAMUSCULAR | Status: AC
  Administered 2022-06-08 – 2022-06-09 (×2): 40 mg via INTRAVENOUS
  Filled 2022-06-08 (×2): qty 1

## 2022-06-08 MED ORDER — FUROSEMIDE 10 MG/ML IJ SOLN
40.0000 mg | Freq: Two times a day (BID) | INTRAMUSCULAR | Status: DC
  Administered 2022-06-08 – 2022-06-09 (×2): 40 mg via INTRAVENOUS
  Filled 2022-06-08 (×2): qty 4

## 2022-06-08 MED ORDER — FUROSEMIDE 10 MG/ML IJ SOLN
60.0000 mg | Freq: Once | INTRAMUSCULAR | Status: AC
  Administered 2022-06-08: 60 mg via INTRAVENOUS
  Filled 2022-06-08: qty 6

## 2022-06-08 MED ORDER — ACETAMINOPHEN 325 MG PO TABS: 650.0000 mg | ORAL_TABLET | Freq: Four times a day (QID) | ORAL | Status: DC | PRN

## 2022-06-08 MED ORDER — NITROGLYCERIN 0.4 MG SL SUBL: 0.4000 mg | SUBLINGUAL_TABLET | SUBLINGUAL | Status: DC | PRN

## 2022-06-08 MED ORDER — LEVOTHYROXINE SODIUM 75 MCG PO TABS
150.0000 ug | ORAL_TABLET | Freq: Every day | ORAL | Status: DC
  Administered 2022-06-08 – 2022-06-10 (×3): 150 ug via ORAL
  Filled 2022-06-08 (×3): qty 2

## 2022-06-08 MED ORDER — ONDANSETRON HCL 4 MG/2ML IJ SOLN: 4.0000 mg | Freq: Four times a day (QID) | INTRAMUSCULAR | Status: DC | PRN

## 2022-06-08 NOTE — ED Notes (Signed)
ED TO INPATIENT HANDOFF REPORT  ED Nurse Name and Phone #: Korina Tretter RN [phone]  S Name/Age/Gender Matthew May 77 y.o. male Room/Bed: 55C/042C  Code Status   Code Status: DNR  Home/SNF/Other Home Patient oriented to: self, place, time, and situation Is this baseline? Yes   Triage Complete: Triage complete  Chief Complaint Acute respiratory failure with hypoxia ([place]) [J96.01]  Triage Note Patient here with complaint of shortness of breath that started a few days ago. Patient denies chest pain, denies cough, denies nausea and vomiting. Patient is alert, oriented, and in no apparent distress a this time.   Allergies Allergies  Allergen Reactions   Prednisone Other (See Comments)    Pt states "sugar went to 580"   Januvia [Sitagliptin] Nausea And Vomiting   Jardiance [Empagliflozin] Rash   Lisinopril Cough    Level of Care/Admitting Diagnosis ED Disposition     ED Disposition  Admit   Condition  --   Comment  Hospital Area: [facility] [facility] [[id number]]  Level of Care: Telemetry Medical [[id number]]  May [facility] patient to [facility] or [facility] if equivalent level of care is available:: Yes  Covid Evaluation: Confirmed COVID Negative  Diagnosis: Acute respiratory failure with hypoxia [facility]) [[id number]]  Admitting Physician: Jonetta Osgood [[id number]]  Attending Physician: Jonetta Osgood [[id number]]  Bed request comments: prefer [place] if possible, or [place]  Certification:: I certify this patient will need inpatient services for at least 2 midnights  Estimated Length of Stay: 2          B Medical/Surgery History Past Medical History:  Diagnosis Date   Atrial fibrillation ([place])    CAD (coronary artery disease)    a. s/p NSTEMI 4/11 => s/p CABG (L-LAD, S-OM2, S-PDA/PL);  b.  ETT-Myoview 6/14:  normal study, no ischemia, EF 67%   Carotid stenosis    Carotid U/S 2/95:  RICA 2-84%, LICA 13-24%; right vertebral flow retrograde suggestive of steel-consider PV  consult   Cataract    COPD (chronic obstructive pulmonary disease) ([place])    Diabetes mellitus without complication ([place])    HLD (hyperlipidemia)    HTN (hypertension)    Obesity    Renal insufficiency    Severe aortic stenosis    Subclavian artery stenosis, right ([place])    based upon carotid U/S done 11/2012   Past Surgical History:  Procedure Laterality Date   CATARACT EXTRACTION Left    CORONARY ARTERY BYPASS GRAFT     2011, LIMA to LAD coronary artery, SVG to OM2 branch of lect circumflex coronary artery, and a sequential SVG to psot descening to posterolateral branches to RCA   endoscopic vein harvesting     right leg    HERNIA REPAIR     RIGHT/LEFT HEART CATH AND CORONARY/GRAFT ANGIOGRAPHY N/A 12/31/2017   Procedure: RIGHT/LEFT HEART CATH AND CORONARY/GRAFT ANGIOGRAPHY;  Surgeon: Leonie Man, MD;  Location: [place] [facility];  Service: Cardiovascular;  Laterality: N/A;   RIGHT/LEFT HEART CATH AND CORONARY/GRAFT ANGIOGRAPHY N/A 12/30/2019   Procedure: RIGHT/LEFT HEART CATH AND CORONARY/GRAFT ANGIOGRAPHY;  Surgeon: Belva Crome, MD;  Location: [place] [facility];  Service: Cardiovascular;  Laterality: N/A;   TEE WITHOUT CARDIOVERSION N/A 01/20/2020   Procedure: TRANSESOPHAGEAL ECHOCARDIOGRAM (TEE);  Surgeon: [name], MD;  Location: [place];  Service: Open Heart Surgery;  Laterality: N/A;   TONSILLECTOMY     TRANSCATHETER AORTIC VALVE REPLACEMENT, TRANSFEMORAL N/A 01/20/2020   Procedure: TRANSCATHETER AORTIC VALVE REPLACEMENT,  TRANSFEMORALUSING EDWARDS SAPIEN 3 29 MM AORTIC THV.;  Surgeon: [name], MD;  Location: [place];  Service: Open Heart Surgery;  Laterality: N/A;     A IV Location/Drains/Wounds Patient Lines/Drains/Airways Status     Active Line/Drains/Airways     Name Placement date Placement time Site Days   Peripheral IV 06/08/22 20 G Anterior;Distal;Right Forearm 06/08/22  0834  Forearm  less than 1   Incision (Closed) 01/20/20 Groin Left 01/20/20   0923  -- 870   Incision (Closed) 01/20/20 Groin Right 01/20/20  0923  -- 870            Intake/Output Last 24 hours  Intake/Output Summary (Last 24 hours) at 06/08/2022 1734 Last data filed at 06/08/2022 1554 Gross per 24 hour  Intake --  Output 700 ml  Net -700 ml    Labs/Imaging Results for orders placed or performed during the hospital encounter of 06/07/22 (from the past 48 hour(s))  Brain natriuretic peptide     Status: None   Collection Time: 06/07/22  2:59 PM  Result Value Ref Range   B Natriuretic Peptide 44.6 0.0 - 100.0 pg/mL    Comment: Performed at [facility], [address], [place], [place] [address]  Resp panel by RT-PCR (RSV, Flu A&B, Covid) Anterior Nasal Swab     Status: None   Collection Time: 06/07/22  2:59 PM   Specimen: Anterior Nasal Swab  Result Value Ref Range   SARS Coronavirus 2 by RT PCR NEGATIVE NEGATIVE    Comment: (NOTE) SARS-CoV-2 target nucleic acids are NOT DETECTED.  The SARS-CoV-2 RNA is generally detectable in upper respiratory specimens during the acute phase of infection. The lowest concentration of SARS-CoV-2 viral copies this assay can detect is 138 copies/mL. A negative result does not preclude SARS-Cov-2 infection and should not be used as the sole basis for treatment or other patient management decisions. A negative result may occur with  improper specimen collection/handling, submission of specimen other than nasopharyngeal swab, presence of viral mutation(s) within the areas targeted by this assay, and inadequate number of viral copies(<138 copies/mL). A negative result must be combined with clinical observations, patient history, and epidemiological information. The expected result is Negative.  Fact Sheet for Patients:  EntrepreneurPulse.com.au  Fact Sheet for Healthcare Providers:  IncredibleEmployment.be  This test is no t yet approved or cleared by the [place] FDA and   has been authorized for detection and/or diagnosis of SARS-CoV-2 by FDA under an Emergency Use Authorization (EUA). This EUA will remain  in effect (meaning this test can be used) for the duration of the COVID-19 declaration under Section 564(b)(1) of the Act, 21 U.S.C.section 360bbb-3(b)(1), unless the authorization is terminated  or revoked sooner.       Influenza A by PCR NEGATIVE NEGATIVE   Influenza B by PCR NEGATIVE NEGATIVE    Comment: (NOTE) The Xpert Xpress SARS-CoV-2/FLU/RSV plus assay is intended as an aid in the diagnosis of influenza from Nasopharyngeal swab specimens and should not be used as a sole basis for treatment. Nasal washings and aspirates are unacceptable for Xpert Xpress SARS-CoV-2/FLU/RSV testing.  Fact Sheet for Patients: EntrepreneurPulse.com.au  Fact Sheet for Healthcare Providers: IncredibleEmployment.be  This test is not yet approved or cleared by the [place] FDA and has been authorized for detection and/or diagnosis of SARS-CoV-2 by FDA under an Emergency Use Authorization (EUA). This EUA will remain in effect (meaning this test can be used) for the duration of the COVID-19 declaration  under Section 564(b)(1) of the Act, 21 U.S.C. section 360bbb-3(b)(1), unless the authorization is terminated or revoked.     Resp Syncytial Virus by PCR NEGATIVE NEGATIVE    Comment: (NOTE) Fact Sheet for Patients: EntrepreneurPulse.com.au  Fact Sheet for Healthcare Providers: IncredibleEmployment.be  This test is not yet approved or cleared by the [place] FDA and has been authorized for detection and/or diagnosis of SARS-CoV-2 by FDA under an Emergency Use Authorization (EUA). This EUA will remain in effect (meaning this test can be used) for the duration of the COVID-19 declaration under Section 564(b)(1) of the Act, 21 U.S.C. section 360bbb-3(b)(1), unless the  authorization is terminated or revoked.  Performed at [facility], [place] [address] [place], [place] [address]   CBC with Differential     Status: None   Collection Time: 06/07/22  3:32 PM  Result Value Ref Range   WBC 9.5 4.0 - 10.5 K/uL   RBC 4.76 4.22 - 5.81 MIL/uL   Hemoglobin 14.2 13.0 - 17.0 g/dL   HCT 45.1 39.0 - 52.0 %   MCV 94.7 80.0 - 100.0 fL   MCH 29.8 26.0 - 34.0 pg   MCHC 31.5 30.0 - 36.0 g/dL   RDW 12.7 11.5 - 15.5 %   Platelets 214 150 - 400 K/uL   nRBC 0.0 0.0 - 0.2 %   Neutrophils Relative % 74 %   Neutro Abs 6.9 1.7 - 7.7 K/uL   Lymphocytes Relative 18 %   Lymphs Abs 1.8 0.7 - 4.0 K/uL   Monocytes Relative 5 %   Monocytes Absolute 0.5 0.1 - 1.0 K/uL   Eosinophils Relative 3 %   Eosinophils Absolute 0.3 0.0 - 0.5 K/uL   Basophils Relative 0 %   Basophils Absolute 0.0 0.0 - 0.1 K/uL   Immature Granulocytes 0 %   Abs Immature Granulocytes 0.03 0.00 - 0.07 K/uL    Comment: Performed at [facility], [address] [place], [place] [address]  Comprehensive metabolic panel     Status: Abnormal   Collection Time: 06/08/22  1:19 AM  Result Value Ref Range   Sodium 138 135 - 145 mmol/L   Potassium 4.0 3.5 - 5.1 mmol/L   Chloride 97 (L) 98 - 111 mmol/L   CO2 31 22 - 32 mmol/L   Glucose, Bld 250 (H) 70 - 99 mg/dL    Comment: Glucose reference range applies only to samples taken after fasting for at least 8 hours.   BUN 12 8 - 23 mg/dL   Creatinine, Ser 0.85 0.61 - 1.24 mg/dL   Calcium 8.8 (L) 8.9 - 10.3 mg/dL   Total Protein 7.5 6.5 - 8.1 g/dL   Albumin 3.3 (L) 3.5 - 5.0 g/dL   AST 35 15 - 41 U/L   ALT 34 0 - 44 U/L   Alkaline Phosphatase 74 38 - 126 U/L   Total Bilirubin 0.6 0.3 - 1.2 mg/dL   GFR, Estimated >60 >60 mL/min    Comment: (NOTE) Calculated using the CKD-EPI Creatinine Equation (2021)    Anion gap 10 5 - 15    Comment: Performed at [facility], [place] [address], [place], [place] [address]  Procalcitonin - Baseline      Status: None   Collection Time: 06/08/22  9:51 AM  Result Value Ref Range   Procalcitonin <0.10 ng/mL    Comment:        Interpretation: PCT (Procalcitonin) <= 0.5 ng/mL: Systemic infection (sepsis) is not  likely. Local bacterial infection is possible. (NOTE)       Sepsis PCT Algorithm           Lower Respiratory Tract                                      Infection PCT Algorithm    ----------------------------     ----------------------------         PCT < 0.25 ng/mL                PCT < 0.10 ng/mL          Strongly encourage             Strongly discourage   discontinuation of antibiotics    initiation of antibiotics    ----------------------------     -----------------------------       PCT 0.25 - 0.50 ng/mL            PCT 0.10 - 0.25 ng/mL               OR       >80% decrease in PCT            Discourage initiation of                                            antibiotics      Encourage discontinuation           of antibiotics    ----------------------------     -----------------------------         PCT >= 0.50 ng/mL              PCT 0.26 - 0.50 ng/mL               AND        <80% decrease in PCT             Encourage initiation of                                             antibiotics       Encourage continuation           of antibiotics    ----------------------------     -----------------------------        PCT >= 0.50 ng/mL                  PCT > 0.50 ng/mL               AND         increase in PCT                  Strongly encourage                                      initiation of antibiotics    Strongly encourage escalation           of antibiotics                                     -----------------------------  PCT <= 0.25 ng/mL                                                 OR                                        > 80% decrease in PCT                                      Discontinue / Do not initiate                                              antibiotics  Performed at [facility], [place] [address], [place], [place] [address]   CBG monitoring, ED     Status: Abnormal   Collection Time: 06/08/22  5:13 PM  Result Value Ref Range   Glucose-Capillary 236 (H) 70 - 99 mg/dL    Comment: Glucose reference range applies only to samples taken after fasting for at least 8 hours.   ECHOCARDIOGRAM COMPLETE  Result Date: 06/08/2022    ECHOCARDIOGRAM REPORT   Patient Name:   Matthew May Date of Exam: 06/08/2022 Medical Rec #:  [id number]        Height:       66.5 in Accession #:    [id number]       Weight:       249.0 lb Date of Birth:  04/11/1946         BSA:          2.207 m Patient Age:    64 years         BP:           128/56 mmHg Patient Gender: M                HR:           92 bpm. Exam Location:  Inpatient Procedure: 2D Echo, Cardiac Doppler, Color Doppler and Intracardiac            Opacification Agent Indications:    CHF-Acute Diastolic I01.65  History:        Patient has prior history of Echocardiogram examinations, most                 recent 01/31/2021. CAD, COPD, Aortic Valve Disease,                 Arrythmias:Atrial Fibrillation; Risk Factors:Hypertension,                 Dyslipidemia and Diabetes.                 Aortic Valve: 29 mm Edwards Sapien prosthetic, stented (TAVR)                 valve is present in the aortic position.  Sonographer:    Bernadene Person RDCS Referring Phys: [place]  Sonographer Comments: Pt sitting straight up for exam, unable to tolerate laying down. IMPRESSIONS  1. Left ventricular ejection fraction, by estimation,  is 70 to 75%. The left ventricle has hyperdynamic function. The left ventricle has no regional wall motion abnormalities. There is mild asymmetric left ventricular hypertrophy of the basal-septal segment. Left ventricular diastolic parameters are indeterminate.  2. Right ventricular systolic function is mildly reduced. The right ventricular size is  normal. Tricuspid regurgitation signal is inadequate for assessing PA pressure.  3. Right atrial size was mildly dilated.  4. The mitral valve is normal in structure. No evidence of mitral valve regurgitation. The mean mitral valve gradient is 8.0 mmHg with average heart rate of 93 bpm. Suspect elevated gradient through mitral valve due to high flow state and not mitral stenosis, MVA normal (3.3 cm^2) by continuity equation  5. The aortic valve has been repaired/replaced. Aortic valve regurgitation is not visualized. 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 Echo findings are consistent with normal structure and function of the aortic valve prosthesis. Aortic valve mean gradient measures 7.0 mmHg. FINDINGS  Left Ventricle: Left ventricular ejection fraction, by estimation, is 70 to 75%. The left ventricle has hyperdynamic function. The left ventricle has no regional wall motion abnormalities. Definity contrast agent was given IV to delineate the left ventricular endocardial borders. The left ventricular internal cavity size was small. There is mild asymmetric left ventricular hypertrophy of the basal-septal segment. Left ventricular diastolic parameters are indeterminate. Right Ventricle: The right ventricular size is normal. No increase in right ventricular wall thickness. Right ventricular systolic function is mildly reduced. Tricuspid regurgitation signal is inadequate for assessing PA pressure. Left Atrium: Left atrial size was normal in size. Right Atrium: Right atrial size was mildly dilated. Pericardium: Trivial pericardial effusion is present. Presence of epicardial fat layer. Mitral Valve: The mitral valve is normal in structure. No evidence of mitral valve regurgitation. MV peak gradient, 15.8 mmHg. The mean mitral valve gradient is 8.0 mmHg with average heart rate of 93 bpm. Tricuspid Valve: The tricuspid valve is normal in structure. Tricuspid valve  regurgitation is trivial. Aortic Valve: The aortic valve has been repaired/replaced. Aortic valve regurgitation is not visualized. Aortic valve mean gradient measures 7.0 mmHg. Aortic valve peak gradient measures 10.3 mmHg. Aortic valve area, by VTI measures 3.60 cm. 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 Echo findings are consistent with normal structure and function of the aortic valve prosthesis. Pulmonic Valve: The pulmonic valve was not well visualized. Pulmonic valve regurgitation is not visualized. Aorta: The aortic root and ascending aorta are structurally normal, with no evidence of dilitation. IAS/Shunts: The interatrial septum was not well visualized.  LEFT VENTRICLE PLAX 2D LVIDd:         3.70 cm      Diastology LVIDs:         2.40 cm      LV e' medial:    5.34 cm/s LV PW:         1.00 cm      LV E/e' medial:  21.3 LV IVS:        1.00 cm      LV e' lateral:   6.75 cm/s LVOT diam:     2.90 cm      LV E/e' lateral: 16.9 LV SV:         117 LV SV Index:   53 LVOT Area:     6.61 cm  LV Volumes (MOD) LV vol d, MOD A2C: 102.0 ml LV vol d, MOD A4C:  86.4 ml LV vol s, MOD A2C: 24.1 ml LV vol s, MOD A4C: 24.8 ml LV SV MOD A2C:     77.9 ml LV SV MOD A4C:     86.4 ml LV SV MOD BP:      70.0 ml RIGHT VENTRICLE RV S prime:     9.20 cm/s TAPSE (M-mode): 1.6 cm LEFT ATRIUM             Index        RIGHT ATRIUM           Index LA diam:        4.20 cm 1.90 cm/m   RA Area:     23.50 cm LA Vol (A2C):   55.0 ml 24.92 ml/m  RA Volume:   75.40 ml  34.16 ml/m LA Vol (A4C):   55.6 ml 25.19 ml/m LA Biplane Vol: 57.9 ml 26.23 ml/m  AORTIC VALVE AV Area (Vmax):    3.66 cm AV Area (Vmean):   3.06 cm AV Area (VTI):     3.60 cm AV Vmax:           160.67 cm/s AV Vmean:          119.667 cm/s AV VTI:            0.324 m AV Peak Grad:      10.3 mmHg AV Mean Grad:      7.0 mmHg LVOT Vmax:         89.10 cm/s LVOT Vmean:        55.500 cm/s LVOT VTI:          0.177 m LVOT/AV  VTI ratio: 0.55  AORTA Ao Root diam: 3.30 cm Ao Asc diam:  3.30 cm MITRAL VALVE MV Area (PHT): 3.37 cm     SHUNTS MV Area VTI:   3.28 cm     Systemic VTI:  0.18 m MV Peak grad:  15.8 mmHg    Systemic Diam: 2.90 cm MV Mean grad:  8.0 mmHg MV Vmax:       1.99 m/s MV Vmean:      131.0 cm/s MV Decel Time: 225 msec MV E velocity: 114.00 cm/s MV A velocity: 177.00 cm/s MV E/A ratio:  0.64 Oswaldo Milian MD Electronically signed by Oswaldo Milian MD Signature Date/Time: 06/08/2022/2:53:21 PM    Final    DG Chest 2 View  Result Date: 06/07/2022 CLINICAL DATA:  SOB EXAM: CHEST - 2 VIEW COMPARISON:  01/20/20 CXR FINDINGS: Status post median sternotomy and CABG. Postprocedural changes from TAVR. Pleural effusion. No pneumothorax. Hazy opacity at the left lung base is nonspecific and could represent atelectasis, but superimposed infection is difficult to exclude. No displaced rib fractures. Visualized upper abdomen is unremarkable. Vertebral body heights are maintained. IMPRESSION: Hazy opacity at the left lung base is nonspecific and could represent atelectasis, but superimposed infection is difficult to exclude. Electronically Signed   By: Marin Roberts M.D.   On: 06/07/2022 16:20    Pending Labs Unresulted Labs (From admission, onward)     Start     Ordered   06/09/22 0258  Basic metabolic panel  Tomorrow morning,   R        06/08/22 1224   06/08/22 1220  Hemoglobin A1c  Once,   R       Comments: To assess prior glycemic control    06/08/22 1224            Vitals/Pain Today's Vitals   06/08/22 1500 06/08/22  1515 06/08/22 1530 06/08/22 1555  BP: 132/80     Pulse: (!) 101 (!) 102 100   Resp: (!) 6 (!) 23 16   Temp:    97.9 F (36.6 C)  TempSrc:    Oral  SpO2: 92% 92% 94%   PainSc:        Isolation Precautions No active isolations  Medications Medications  atorvastatin (LIPITOR) tablet 40 mg (40 mg Oral Given 06/08/22 1434)  nitroGLYCERIN (NITROSTAT) SL tablet 0.4 mg (has no  administration in time range)  irbesartan (AVAPRO) tablet 75 mg (has no administration in time range)  dapagliflozin propanediol (FARXIGA) tablet 10 mg (10 mg Oral Given 06/08/22 1434)  levothyroxine (SYNTHROID) tablet 150 mcg (150 mcg Oral Given 06/08/22 1433)  meclizine (ANTIVERT) tablet 25 mg (has no administration in time range)  apixaban (ELIQUIS) tablet 5 mg (has no administration in time range)  potassium chloride SA (KLOR-CON M) CR tablet 20 mEq (20 mEq Oral Given 06/08/22 1434)  Budeson-Glycopyrrol-Formoterol 160-9-4.8 MCG/ACT AERO 2 puff (has no administration in time range)  fluticasone (FLONASE) 50 MCG/ACT nasal spray 2 spray (has no administration in time range)  loratadine (CLARITIN) tablet 10 mg (10 mg Oral Given 06/08/22 1434)  insulin aspart (novoLOG) injection 0-9 Units (3 Units Subcutaneous Given 06/08/22 1732)  furosemide (LASIX) injection 40 mg (40 mg Intravenous Given 06/08/22 1437)  sodium chloride flush (NS) 0.9 % injection 3 mL (3 mLs Intravenous Given 06/08/22 1436)  sodium chloride flush (NS) 0.9 % injection 3 mL (has no administration in time range)  0.9 %  sodium chloride infusion (has no administration in time range)  acetaminophen (TYLENOL) tablet 650 mg (has no administration in time range)    Or  acetaminophen (TYLENOL) suppository 650 mg (has no administration in time range)  polyethylene glycol (MIRALAX / GLYCOLAX) packet 17 g (has no administration in time range)  ondansetron (ZOFRAN) tablet 4 mg (has no administration in time range)    Or  ondansetron (ZOFRAN) injection 4 mg (has no administration in time range)  albuterol (PROVENTIL) (2.5 MG/3ML) 0.083% nebulizer solution 2.5 mg (has no administration in time range)  ipratropium-albuterol (DUONEB) 0.5-2.5 (3) MG/3ML nebulizer solution 3 mL (3 mLs Nebulization Given 06/08/22 1554)  guaiFENesin (MUCINEX) 12 hr tablet 600 mg (600 mg Oral Given 06/08/22 1554)  cefTRIAXone (ROCEPHIN) 2 g in sodium chloride 0.9 % 100 mL IVPB  (has no administration in time range)  azithromycin (ZITHROMAX) 500 mg in sodium chloride 0.9 % 250 mL IVPB (has no administration in time range)  methylPREDNISolone sodium succinate (SOLU-MEDROL) 40 mg/mL injection 40 mg (40 mg Intravenous Given 06/08/22 1435)  perflutren lipid microspheres (DEFINITY) IV suspension (2 mLs Intravenous Given 06/08/22 1424)  cefTRIAXone (ROCEPHIN) 1 g in sodium chloride 0.9 % 100 mL IVPB (0 g Intravenous Stopped 06/08/22 0943)  azithromycin (ZITHROMAX) 500 mg in sodium chloride 0.9 % 250 mL IVPB (0 mg Intravenous Stopped 06/08/22 1104)  methylPREDNISolone sodium succinate (SOLU-MEDROL) 125 mg/2 mL injection 125 mg (125 mg Intravenous Given 06/08/22 0838)  ipratropium-albuterol (DUONEB) 0.5-2.5 (3) MG/3ML nebulizer solution 3 mL (3 mLs Nebulization Given 06/08/22 0839)  furosemide (LASIX) injection 60 mg (60 mg Intravenous Given 06/08/22 0838)    Mobility walks with device Moderate fall risk   Focused Assessments Cardiac Assessment Handoff:    Lab Results  Component Value Date   CKTOTAL 83 01/18/2017   CKMB 2.1 10/01/2009   TROPONINI <0.03 12/30/2017   Lab Results  Component Value Date   DDIMER 0.94 (H)  03/07/2016   Does the Patient currently have chest pain? No   , Pulmonary Assessment Handoff:  Lung sounds: Bilateral Breath Sounds: Expiratory wheezes L Breath Sounds: Expiratory wheezes R Breath Sounds: Expiratory wheezes O2 Device: Nasal Cannula O2 Flow Rate (L/min): 3 L/min    R Recommendations: See Admitting Provider Note  Report given to:   Additional Notes:

## 2022-06-08 NOTE — H&P (Signed)
History and Physical    Patient: Matthew May XHB:[id number] DOB: December 26, 1945 DOA: 06/07/2022 DOS: the patient was seen and examined on 06/08/2022 PCP: Sharion Balloon, FNP  Patient coming from: Home  Chief Complaint:  Chief Complaint  Patient presents with   Shortness of Breath   HPI: Matthew May is a 77 y.o. male with medical history significant of COPD, chronic HFpEF, CAD s/p CABG, severe AS-s/p TAVR, PAF on Eliquis-who presented to the hospital today with the above-noted complaints.  Please note-patient is somewhat of a poor historian-most of this history was provided by his spouse at bedside.  Per history obtained-approximately 3-4 days ago patient started developing mostly a dry cough.  It has progressed to a mostly productive cough-with white phlegm.  During this time-he has had gradual worsening of his shortness of breath.  He has chronic lower extremity edema but this has worsened significantly over the past several days as well.  He acknowledges a unspecified amount of orthopnea.  Denies any fever.  He has used his bronchodilator regimen very frequently but with no relief.  He subsequently presented to the ED where he was thought to have acute hypoxic respiratory failure in the setting of COPD exacerbation and decompensated diastolic heart failure.  He was given steroids/bronchodilators/empiric antibiotics/Lasix with some improvement but at time hospitalist service evaluated this patient.  Patient denies any fever, headache, chest pain, nausea, vomiting or diarrhea.  He denies any abdominal pain.   Review of Systems: As mentioned in the history of present illness. All other systems reviewed and are negative. Past Medical History:  Diagnosis Date   Atrial fibrillation ([place])    CAD (coronary artery disease)    a. s/p NSTEMI 4/11 => s/p CABG (L-LAD, S-OM2, S-PDA/PL);  b.  ETT-Myoview 6/14:  normal study, no ischemia, EF 67%   Carotid stenosis    Carotid U/S 8/10:  RICA  1-75%, LICA 10-25%; right vertebral flow retrograde suggestive of steel-consider PV consult   Cataract    COPD (chronic obstructive pulmonary disease) (HCC)    Diabetes mellitus without complication ([place])    HLD (hyperlipidemia)    HTN (hypertension)    Obesity    Renal insufficiency    Severe aortic stenosis    Subclavian artery stenosis, right ([place])    based upon carotid U/S done 11/2012   Past Surgical History:  Procedure Laterality Date   CATARACT EXTRACTION Left    CORONARY ARTERY BYPASS GRAFT     2011, LIMA to LAD coronary artery, SVG to OM2 branch of lect circumflex coronary artery, and a sequential SVG to psot descening to posterolateral branches to RCA   endoscopic vein harvesting     right leg    HERNIA REPAIR     RIGHT/LEFT HEART CATH AND CORONARY/GRAFT ANGIOGRAPHY N/A 12/31/2017   Procedure: RIGHT/LEFT HEART CATH AND CORONARY/GRAFT ANGIOGRAPHY;  Surgeon: Leonie Man, MD;  Location: [facility];  Service: Cardiovascular;  Laterality: N/A;   RIGHT/LEFT HEART CATH AND CORONARY/GRAFT ANGIOGRAPHY N/A 12/30/2019   Procedure: RIGHT/LEFT HEART CATH AND CORONARY/GRAFT ANGIOGRAPHY;  Surgeon: Belva Crome, MD;  Location: [facility];  Service: Cardiovascular;  Laterality: N/A;   TEE WITHOUT CARDIOVERSION N/A 01/20/2020   Procedure: TRANSESOPHAGEAL ECHOCARDIOGRAM (TEE);  Surgeon: [name], MD;  Location: [facility];  Service: Open Heart Surgery;  Laterality: N/A;   TONSILLECTOMY     TRANSCATHETER AORTIC VALVE REPLACEMENT, TRANSFEMORAL N/A 01/20/2020   Procedure: TRANSCATHETER AORTIC VALVE REPLACEMENT, TRANSFEMORALUSING EDWARDS SAPIEN 3 29  MM AORTIC THV.;  Surgeon: [name], MD;  Location: [facility];  Service: Open Heart Surgery;  Laterality: N/A;   Social History:  reports that he quit smoking about 12 years ago. His smoking use included cigarettes. He has never used smokeless tobacco. He reports that he does not drink alcohol and does not use drugs.  Allergies   Allergen Reactions   Prednisone Other (See Comments)    Pt states "sugar went to 580"   Januvia [Sitagliptin] Nausea And Vomiting   Jardiance [Empagliflozin] Rash   Lisinopril Cough    Family History  Problem Relation Age of Onset   Heart disease Father    Heart attack Father    Mental illness Mother    Mental illness Sister    Diabetes Brother    Depression Maternal Aunt     Prior to Admission medications   Medication Sig Start Date End Date Taking? Authorizing Provider  acetaminophen (TYLENOL) 325 MG tablet Take 2 tablets (650 mg total) by mouth every 6 (six) hours as needed for mild pain (or Fever >/= 101). 01/21/20   Antony Odea, PA-C  albuterol (VENTOLIN HFA) 108 (90 Base) MCG/ACT inhaler INHALE 2 PUFFS BY MOUTH EVERY 6 HOURS AS NEEDED FOR WHEEZING AND FOR SHORTNESS OF BREATH 04/10/22   Evelina Dun A, FNP  atorvastatin (LIPITOR) 40 MG tablet TAKE 1 TABLET BY MOUTH DAILY 12/12/21   Evelina Dun A, FNP  Budeson-Glycopyrrol-Formoterol (BREZTRI AEROSPHERE) 160-9-4.8 MCG/ACT AERO Inhale 2 puffs into the lungs 2 (two) times daily. 07/22/21   Sharion Balloon, FNP  cetirizine (ZYRTEC) 10 MG tablet Take 1 tablet (10 mg total) by mouth daily. 04/07/21   Evelina Dun A, FNP  cholecalciferol (VITAMIN D3) 25 MCG (1000 UT) tablet Take 1,000 Units by mouth daily.    [provider]  colchicine 0.6 MG tablet TAKE 2 TABLETS BY MOUTH NOW, THEN TAKE 1 ADDITIONAL TABLET IN 1 HOUR. TAKE 1 TABLET ONCE DAILY THEREAFTER UNTIL SYMPTOMS RESOVE 03/28/21   Evelina Dun A, FNP  dapagliflozin propanediol (FARXIGA) 10 MG TABS tablet Take 1 tablet (10 mg total) by mouth daily. 07/22/21   Sharion Balloon, FNP  ELIQUIS 5 MG TABS tablet Take 1 tablet by mouth twice daily 05/01/22   Evelina Dun A, FNP  fluticasone (FLONASE) 50 MCG/ACT nasal spray Place 2 sprays into both nostrils daily. 04/07/21   Sharion Balloon, FNP  glimepiride (AMARYL) 2 MG tablet TAKE 1 TABLET BY MOUTH DAILY  WITH  BREAKFAST 05/10/22   Evelina Dun A, FNP  glucose blood (ONETOUCH VERIO) test strip CHECK BLOOD SUGAR DAILY Dx E11.9 05/01/22   Sharion Balloon, FNP  Lancets (ONETOUCH DELICA PLUS [id number]) MISC CHECK BLOOD SUGAR DAILY Dx E11.9 10/06/21   Evelina Dun A, FNP  levothyroxine (SYNTHROID) 150 MCG tablet TAKE 1 TABLET BY MOUTH DAILY 03/02/22   Evelina Dun A, FNP  meclizine (ANTIVERT) 25 MG tablet TAKE 1 TABLET BY MOUTH THREE TIMES DAILY AS NEEDED FOR DIZZINESS 02/23/22   Evelina Dun A, FNP  metFORMIN (GLUCOPHAGE) 1000 MG tablet TAKE 1 TABLET BY MOUTH TWICE  DAILY 12/12/21   Evelina Dun A, FNP  nitroGLYCERIN (NITROSTAT) 0.4 MG SL tablet DISSOLVE ONE TABLET UNDER THE TONGUE EVERY 5 MINUTES AS NEEDED FOR CHEST PAIN.  DO NOT EXCEED A TOTAL OF 3 DOSES IN 15 MINUTES 10/17/19   [name], Alyse Low A, FNP  ondansetron (ZOFRAN) 4 MG tablet Take 1 tablet (4 mg total) by mouth every 8 (eight) hours as needed  for nausea or vomiting. 03/23/21   Sharion Balloon, FNP  potassium chloride SA (KLOR-CON M) 20 MEQ tablet TAKE 1 TABLET BY MOUTH DAILY 03/02/22   Minus Breeding, MD  torsemide (DEMADEX) 20 MG tablet TAKE 2 TABLETS BY MOUTH DAILY Patient not taking: Reported on 01/23/2022 12/12/21   Sharion Balloon, FNP  valsartan (DIOVAN) 40 MG tablet Take 1 tablet by mouth once daily 04/22/21   Minus Breeding, MD    Physical Exam: Vitals:   06/08/22 0826 06/08/22 0900 06/08/22 1000 06/08/22 1130  BP:  139/75 (!) 155/74 (!) 158/75  Pulse:  79 90 94  Resp:  (!) '22 20 13  '$ Temp:    97.8 F (36.6 C)  TempSrc:    Oral  SpO2: (!) 87% 91% 93% 92%   Gen Exam:Alert awake-not in any distress HEENT:atraumatic, normocephalic Chest: Moving air well-some scattered rhonchi. CVS:S1S2 regular Abdomen:soft non tender, non distended Extremities:+++ Edema Neurology: Non focal Skin: no rash   Data Reviewed:    Latest Ref Rng & Units 06/07/2022    3:32 PM 01/23/2022   12:23 PM 10/04/2021   11:07 AM  CBC  WBC 4.0 - 10.5 K/uL  9.5  9.5  8.5   Hemoglobin 13.0 - 17.0 g/dL 14.2  13.9  14.0   Hematocrit 39.0 - 52.0 % 45.1  42.1  42.8   Platelets 150 - 400 K/uL 214  149  150        Latest Ref Rng & Units 06/08/2022    1:19 AM 01/23/2022   12:23 PM 10/04/2021   11:07 AM  BMP  Glucose 70 - 99 mg/dL 250  253  161   BUN 8 - 23 mg/dL '12  12  10   '$ Creatinine 0.61 - 1.24 mg/dL 0.85  0.89  0.91   BUN/Creat Ratio 10 - '24  13  11   '$ Sodium 135 - 145 mmol/L 138  138  141   Potassium 3.5 - 5.1 mmol/L 4.0  4.4  4.5   Chloride 98 - 111 mmol/L 97  98  99   CO2 22 - 32 mmol/L '31  22  24   '$ Calcium 8.9 - 10.3 mg/dL 8.8  9.1  9.7      Assessment and Plan: Acute hypoxic respiratory failure due to COPD exacerbation and acute on chronic HFpEF Continue to treat underlying etiologies with steroids/bronchodilators and diuretics Titrate off oxygen as he clinically improves Monitor closely in telemetry bed.  Possible PNA Will empirically treat with Rocephin/Zithromax Influenza/COVID/RSV PCR negative Follow clinical course.  HFpEF exacerbation Grossly volume overloaded-however some of this is chronic-but patient acknowledges worsening lower extremity edema/orthopnea/exertional dyspnea for the past several days Starting IV Lasix Monitor in telemetry Daily weights Follow intake/output/electrolytes  History of CAD s/p CABG in 2011 No anginal symptoms/chest pain Telemetry monitoring  History of severe aortic stenosis-s/p TAVR 2021 Check updated echo  PAF Sinus rhythm Continue Eliquis Telemetry monitoring  HTN Continue ARB Follow and optimize  DM-2 Hold oral hypoglycemics Placed on SSI Follow CBGs and optimize  Hypothyroidism Continue Synthroid   Advance Care Planning:   Code Status: DNI-okay for CPR  Consults: None  Family Communication: Spouse at bedside  Severity of Illness: The appropriate patient status for this patient is INPATIENT. Inpatient status is judged to be reasonable and necessary in order to  provide the required intensity of service to ensure the patient's safety. The patient's presenting symptoms, physical exam findings, and initial radiographic and laboratory data in the context of  their chronic comorbidities is felt to place them at high risk for further clinical deterioration. Furthermore, it is not anticipated that the patient will be medically stable for discharge from the hospital within 2 midnights of admission.   * I certify that at the point of admission it is my clinical judgment that the patient will require inpatient hospital care spanning beyond 2 midnights from the point of admission due to high intensity of service, high risk for further deterioration and high frequency of surveillance required.*  Author: Oren Binet, MD 06/08/2022 12:27 PM  For on call review [email or web address].

## 2022-06-08 NOTE — Progress Notes (Signed)
Echocardiogram 2D Echocardiogram has been performed.  Matthew May 06/08/2022, 2:25 PM

## 2022-06-08 NOTE — ED Notes (Signed)
Pt placed on 3L [place]

## 2022-06-08 NOTE — ED Provider Notes (Signed)
[facility] EMERGENCY DEPARTMENT Provider Note   CSN: [id number] Arrival date & time: 06/07/22  1342     History  Chief Complaint  Patient presents with   Shortness of Breath    Matthew May is a 77 y.o. male hx of afib on Eliquis, no missed doses, HTN CAD here for evaluation of SOB, cough. Some chronic DOE, worse over the last 3-4 days. Cough productive with yellow sputum with associated gen weakness. Difficulty getting around due to SOB. Using inhalers at home every 4-6 hours without relief. Having to sleep upright at night due to Orthopnea. Increased in LE swelling, BL.No CP, back pain, abd pain, emesis. Chills without documented fever. No oxygen use at home. No sick contacts.    sch       Home Medications Prior to Admission medications   Medication Sig Start Date End Date Taking? Authorizing Provider  acetaminophen (TYLENOL) 325 MG tablet Take 2 tablets (650 mg total) by mouth every 6 (six) hours as needed for mild pain (or Fever >/= 101). 01/21/20   Antony Odea, PA-C  albuterol (VENTOLIN HFA) 108 (90 Base) MCG/ACT inhaler INHALE 2 PUFFS BY MOUTH EVERY 6 HOURS AS NEEDED FOR WHEEZING AND FOR SHORTNESS OF BREATH 04/10/22   Evelina Dun A, FNP  atorvastatin (LIPITOR) 40 MG tablet TAKE 1 TABLET BY MOUTH DAILY 12/12/21   Evelina Dun A, FNP  Budeson-Glycopyrrol-Formoterol (BREZTRI AEROSPHERE) 160-9-4.8 MCG/ACT AERO Inhale 2 puffs into the lungs 2 (two) times daily. 07/22/21   Sharion Balloon, FNP  cetirizine (ZYRTEC) 10 MG tablet Take 1 tablet (10 mg total) by mouth daily. 04/07/21   Evelina Dun A, FNP  cholecalciferol (VITAMIN D3) 25 MCG (1000 UT) tablet Take 1,000 Units by mouth daily.    [provider]  colchicine 0.6 MG tablet TAKE 2 TABLETS BY MOUTH NOW, THEN TAKE 1 ADDITIONAL TABLET IN 1 HOUR. TAKE 1 TABLET ONCE DAILY THEREAFTER UNTIL SYMPTOMS RESOVE 03/28/21   Evelina Dun A, FNP  dapagliflozin propanediol (FARXIGA) 10 MG TABS  tablet Take 1 tablet (10 mg total) by mouth daily. 07/22/21   Sharion Balloon, FNP  ELIQUIS 5 MG TABS tablet Take 1 tablet by mouth twice daily 05/01/22   Evelina Dun A, FNP  fluticasone (FLONASE) 50 MCG/ACT nasal spray Place 2 sprays into both nostrils daily. 04/07/21   Sharion Balloon, FNP  glimepiride (AMARYL) 2 MG tablet TAKE 1 TABLET BY MOUTH DAILY  WITH BREAKFAST 05/10/22   Evelina Dun A, FNP  glucose blood (ONETOUCH VERIO) test strip CHECK BLOOD SUGAR DAILY Dx E11.9 05/01/22   Sharion Balloon, FNP  Lancets (ONETOUCH DELICA PLUS [id number]) MISC CHECK BLOOD SUGAR DAILY Dx E11.9 10/06/21   Evelina Dun A, FNP  levothyroxine (SYNTHROID) 150 MCG tablet TAKE 1 TABLET BY MOUTH DAILY 03/02/22   Evelina Dun A, FNP  meclizine (ANTIVERT) 25 MG tablet TAKE 1 TABLET BY MOUTH THREE TIMES DAILY AS NEEDED FOR DIZZINESS 02/23/22   Evelina Dun A, FNP  metFORMIN (GLUCOPHAGE) 1000 MG tablet TAKE 1 TABLET BY MOUTH TWICE  DAILY 12/12/21   Evelina Dun A, FNP  nitroGLYCERIN (NITROSTAT) 0.4 MG SL tablet DISSOLVE ONE TABLET UNDER THE TONGUE EVERY 5 MINUTES AS NEEDED FOR CHEST PAIN.  DO NOT EXCEED A TOTAL OF 3 DOSES IN 15 MINUTES 10/17/19   [name], Alyse Low A, FNP  ondansetron (ZOFRAN) 4 MG tablet Take 1 tablet (4 mg total) by mouth every 8 (eight) hours as needed for nausea or vomiting.  03/23/21   Sharion Balloon, FNP  potassium chloride SA (KLOR-CON M) 20 MEQ tablet TAKE 1 TABLET BY MOUTH DAILY 03/02/22   Minus Breeding, MD  torsemide (DEMADEX) 20 MG tablet TAKE 2 TABLETS BY MOUTH DAILY Patient not taking: Reported on 01/23/2022 12/12/21   Sharion Balloon, FNP  valsartan (DIOVAN) 40 MG tablet Take 1 tablet by mouth once daily 04/22/21   Minus Breeding, MD      Allergies    Prednisone, Januvia [sitagliptin], Jardiance [empagliflozin], and Lisinopril    Review of Systems   Review of Systems  Constitutional:  Positive for fatigue.  HENT:  Positive for congestion and postnasal drip. Negative for  rhinorrhea, sinus pressure, sinus pain, trouble swallowing and voice change.   Respiratory:  Positive for cough, shortness of breath and wheezing.   Cardiovascular:  Positive for leg swelling. Negative for chest pain and palpitations.  Gastrointestinal: Negative.   Genitourinary: Negative.   Musculoskeletal: Negative.   Skin: Negative.   Neurological:  Positive for weakness.  All other systems reviewed and are negative.   Physical Exam Updated Vital Signs BP (!) 158/75   Pulse 94   Temp 97.8 F (36.6 C) (Oral)   Resp 13   SpO2 92%  Physical Exam Vitals and nursing note reviewed.  Constitutional:      General: He is not in acute distress.    Appearance: He is well-developed. He is obese. He is ill-appearing (chronically ill appearing). He is not toxic-appearing or diaphoretic.  HENT:     Head: Normocephalic and atraumatic.  Eyes:     Pupils: Pupils are equal, round, and reactive to light.  Cardiovascular:     Rate and Rhythm: Normal rate and regular rhythm.     Pulses: Normal pulses.     Heart sounds: Normal heart sounds.  Pulmonary:     Effort: Pulmonary effort is normal. No respiratory distress.     Breath sounds: Wheezing and rhonchi present.     Comments: Wheeze, rhonchi lower lobes.  Speaks in short sentences. Chest:     Comments: Nontender Abdominal:     General: Bowel sounds are normal. There is no distension.     Palpations: Abdomen is soft.     Comments: Soft, nontender  Musculoskeletal:        General: Normal range of motion.     Cervical back: Normal range of motion and neck supple.     Right lower leg: No tenderness. Edema present.     Left lower leg: No tenderness. Edema present.     Comments: Bilateral lower extremity edema significant to knees.  Some chronic venous stasis skin changes bilaterally. Non tender pos calves BIL  Skin:    General: Skin is warm and dry.     Capillary Refill: Capillary refill takes less than 2 seconds.  Neurological:      General: No focal deficit present.     Mental Status: He is alert and oriented to person, place, and time.     ED Results / Procedures / Treatments   Labs (all labs ordered are listed, but only abnormal results are displayed) Labs Reviewed  COMPREHENSIVE METABOLIC PANEL - Abnormal; Notable for the following components:      Result Value   Chloride 97 (*)    Glucose, Bld 250 (*)    Calcium 8.8 (*)    Albumin 3.3 (*)    All other components within normal limits  RESP PANEL BY RT-PCR (RSV, FLU A&B, COVID)  [id number]  CBC WITH DIFFERENTIAL/PLATELET  BRAIN NATRIURETIC PEPTIDE  PROCALCITONIN    EKG EKG Interpretation  Date/Time:  Wednesday June 07 2022 15:04:35 EST Ventricular Rate:  77 PR Interval:  230 QRS Duration: 96 QT Interval:  360 QTC Calculation: 407 R Axis:   92 Text Interpretation: Sinus rhythm with sinus arrhythmia with 1st degree A-V block Rightward axis Low voltage QRS Incomplete right bundle branch block Cannot rule out Anteroseptal infarct , age undetermined Abnormal ECG When compared with ECG of 21-Jan-2020 05:18, T wave abnormality has improved poor r wave progression is new Confirmed by Garnette Gunner [id number]) on 06/08/2022 7:45:56 AM  Radiology DG Chest 2 View  Result Date: 06/07/2022 CLINICAL DATA:  SOB EXAM: CHEST - 2 VIEW COMPARISON:  01/20/20 CXR FINDINGS: Status post median sternotomy and CABG. Postprocedural changes from TAVR. Pleural effusion. No pneumothorax. Hazy opacity at the left lung base is nonspecific and could represent atelectasis, but superimposed infection is difficult to exclude. No displaced rib fractures. Visualized upper abdomen is unremarkable. Vertebral body heights are maintained. IMPRESSION: Hazy opacity at the left lung base is nonspecific and could represent atelectasis, but superimposed infection is difficult to exclude. Electronically Signed   By: Marin Roberts M.D.   On: 06/07/2022 16:20    Procedures .Critical Care  Performed by:  Nettie Elm, PA-C Authorized by: Nettie Elm, PA-C   Critical care provider statement:    Critical care time (minutes):  35   Critical care was necessary to treat or prevent imminent or life-threatening deterioration of the following conditions:  Respiratory failure   Critical care was time spent personally by me on the following activities:  Development of treatment plan with patient or surrogate, discussions with consultants, evaluation of patient's response to treatment, examination of patient, ordering and review of laboratory studies, ordering and review of radiographic studies, ordering and performing treatments and interventions, pulse oximetry, re-evaluation of patient's condition and review of old charts     Medications Ordered in ED Medications  cefTRIAXone (ROCEPHIN) 1 g in sodium chloride 0.9 % 100 mL IVPB (0 g Intravenous Stopped 06/08/22 0943)  azithromycin (ZITHROMAX) 500 mg in sodium chloride 0.9 % 250 mL IVPB (0 mg Intravenous Stopped 06/08/22 1104)  methylPREDNISolone sodium succinate (SOLU-MEDROL) 125 mg/2 mL injection 125 mg (125 mg Intravenous Given 06/08/22 0838)  ipratropium-albuterol (DUONEB) 0.5-2.5 (3) MG/3ML nebulizer solution 3 mL (3 mLs Nebulization Given 06/08/22 0839)  furosemide (LASIX) injection 60 mg (60 mg Intravenous Given 06/08/22 8916)    ED Course/ Medical Decision Making/ A&P     77 year old history of A-fib on chronic anticoagulation, CHF, COPD, hypertension here for evaluation of worsening shortness of breath.  Has some degree of chronic DOE at baseline however worse over the last 3 to 4 days.  He has been using his inhalers at home and he has been compliant with his diuretics.  He has noted some increase in his lower extremity swelling.  Having to sleep upright at night due to orthopnea.  Cough productive yellow sputum.  Associated generalized weakness, myalgias as well as subjective fever at home.  Has wet cough.  Wheeze, rhonchi on exam.  Does  have some moderate lower extremity swelling however nontender calves he has complaints anticoagulation low suspicion for VTE, PE.  Noted to be hypoxic to 87% on room air while sitting.  Does appear clinically grossly fluid overloaded as well as wheeze possibly COPD exacerbation.  Plan on labs, imaging, Suspect will likely need admission for acute hypoxic respiratory failure  Labs and imaging personally viewed and interpreted:  CBC without leukocytosis Metabolic panel glucose 284 BNP 44.6 COVID, Flu, RSV neg Chest x-ray hazy opacity left lung possible atelectasis versus infiltrate EKG without ischemic changes  Given evidence of fluid overload, wheeze on exam as well as possible pneumonia we will treat is multifactorial.  He was placed on 3 L via nasal cannula.  Will plan for reassessment. Procalcitonin added given poss PNA.  CONSULT with Hospitalist who is agreeable to evaluate patient for admission  Patient and family agreeable with admission for acute hypoxic respiratory failure suspect multiple factorial in nature  The patient appears reasonably stabilized for admission considering the current resources, flow, and capabilities available in the ED at this time, and I doubt any other [facility] requiring further screening and/or treatment in the ED prior to admission.                            Medical Decision Making Amount and/or Complexity of Data Reviewed Independent Historian:     Details: Family in room External Data Reviewed: labs, radiology, ECG and notes. Labs: ordered. Decision-making details documented in ED Course. Radiology: ordered and independent interpretation performed. Decision-making details documented in ED Course. ECG/medicine tests: ordered and independent interpretation performed. Decision-making details documented in ED Course.  Risk OTC drugs. Prescription drug management. Parenteral controlled substances. Decision regarding hospitalization. Diagnosis or  treatment significantly limited by social determinants of health.          Final Clinical Impression(s) / ED Diagnoses Final diagnoses:  Acute respiratory failure with hypoxia (HCC)  COPD exacerbation (HCC)  Pneumonia of left lung due to infectious organism, unspecified part of lung  Lower extremity edema    Rx / DC Orders ED Discharge Orders     None         Jeremiyah Cullens A, PA-C 06/08/22 1157    Cristie Hem, MD 06/09/22 1239

## 2022-06-09 DIAGNOSIS — Z952 Presence of prosthetic heart valve: Secondary | ICD-10-CM

## 2022-06-09 DIAGNOSIS — J441 Chronic obstructive pulmonary disease with (acute) exacerbation: Secondary | ICD-10-CM | POA: Diagnosis not present

## 2022-06-09 DIAGNOSIS — J9601 Acute respiratory failure with hypoxia: Secondary | ICD-10-CM | POA: Diagnosis not present

## 2022-06-09 DIAGNOSIS — I5033 Acute on chronic diastolic (congestive) heart failure: Secondary | ICD-10-CM | POA: Diagnosis not present

## 2022-06-09 LAB — BASIC METABOLIC PANEL
Anion gap: 12 (ref 5–15)
BUN: 22 mg/dL (ref 8–23)
CO2: 31 mmol/L (ref 22–32)
Calcium: 8.7 mg/dL — ABNORMAL LOW (ref 8.9–10.3)
Chloride: 97 mmol/L — ABNORMAL LOW (ref 98–111)
Creatinine, Ser: 1.07 mg/dL (ref 0.61–1.24)
GFR, Estimated: >60 mL/min (ref >60)
Glucose, Bld: 247 mg/dL — ABNORMAL HIGH (ref 70–99)
Potassium: 4.2 mmol/L (ref 3.5–5.1)
Sodium: 140 mmol/L (ref 135–145)

## 2022-06-09 LAB — GLUCOSE, CAPILLARY
Glucose-Capillary: 247 mg/dL — ABNORMAL HIGH (ref 70–99)
Glucose-Capillary: 248 mg/dL — ABNORMAL HIGH (ref 70–99)
Glucose-Capillary: 252 mg/dL — ABNORMAL HIGH (ref 70–99)
Glucose-Capillary: 241 mg/dL — ABNORMAL HIGH (ref 70–99)

## 2022-06-09 MED ORDER — HYDRALAZINE HCL 20 MG/ML IJ SOLN: 10.0000 mg | Freq: Four times a day (QID) | INTRAMUSCULAR | Status: DC | PRN

## 2022-06-09 MED ORDER — INSULIN ASPART 100 UNIT/ML IJ SOLN
0.0000 [IU] | Freq: Three times a day (TID) | INTRAMUSCULAR | Status: DC
  Administered 2022-06-09: 8 [IU] via SUBCUTANEOUS
  Administered 2022-06-09 – 2022-06-10 (×2): 5 [IU] via SUBCUTANEOUS
  Administered 2022-06-10: 3 [IU] via SUBCUTANEOUS

## 2022-06-09 MED ORDER — IRBESARTAN 75 MG PO TABS
75.0000 mg | ORAL_TABLET | Freq: Every day | ORAL | Status: DC
  Administered 2022-06-10: 75 mg via ORAL
  Filled 2022-06-09: qty 1

## 2022-06-09 MED ORDER — SODIUM CHLORIDE 0.9 % IV SOLN
500.0000 mg | INTRAVENOUS | Status: AC
  Administered 2022-06-09 – 2022-06-10 (×2): 500 mg via INTRAVENOUS
  Filled 2022-06-09 (×2): qty 5

## 2022-06-09 MED ORDER — IPRATROPIUM-ALBUTEROL 0.5-2.5 (3) MG/3ML IN SOLN: 3.0000 mL | Freq: Four times a day (QID) | RESPIRATORY_TRACT | Status: DC | PRN

## 2022-06-09 MED ORDER — FUROSEMIDE 10 MG/ML IJ SOLN
60.0000 mg | Freq: Two times a day (BID) | INTRAMUSCULAR | Status: DC
  Administered 2022-06-09 (×2): 60 mg via INTRAVENOUS
  Filled 2022-06-09 (×2): qty 6

## 2022-06-09 MED ORDER — INSULIN ASPART 100 UNIT/ML IJ SOLN
2.0000 [IU] | Freq: Once | INTRAMUSCULAR | Status: AC
  Administered 2022-06-09: 2 [IU] via SUBCUTANEOUS

## 2022-06-09 NOTE — Progress Notes (Signed)
Patient transfer from ED,alert and oriented,no c/o of pain,patient assessment done and made comfortable in room. Will continue to monitor.

## 2022-06-09 NOTE — Progress Notes (Addendum)
PROGRESS NOTE        PATIENT DETAILS Name: Matthew May Age: 77 y.o. Sex: male Date of Birth: 02-12-1946 Admit Date: 06/07/2022 Admitting Physician Evalee Mutton Kristeen Mans, MD ERX:[id number], Theador Hawthorne, FNP  Brief Summary: Patient is a 77 y.o.  male with history of HFpEF, CAD s/p CABG, severe AAS-s/p TAVR, PAF on Eliquis, COPD who presented with shortness of breath/cough/worsening lower extremity edema-was found to have PNA provoking COPD exacerbation.  Significant events: 1/3>> admit to [facility]-COPD exacerbation-PNA-volume overload.  Significant studies: 1/3>> CXR: Hazy opacity in left lung base 1/3>> echo: EF 70-75%, prosthetic aortic valve with normal structure/function  Significant microbiology data: 1/3>> COVID/influenza/RSV PCR: Negative  Procedures: None  Consults: None  Subjective: Overall feels better-minimal wheezing-significant decrease in lower extremity edema.  Objective: Vitals: Blood pressure 120/64, pulse (!) 102, temperature 98.4 F (36.9 C), temperature source Oral, resp. rate 16, SpO2 92 %.   Exam: Gen Exam:Alert awake-not in any distress HEENT:atraumatic, normocephalic Chest: B/L clear to auscultation anteriorly-only a few scattered rhonchi. CVS:S1S2 regular Abdomen:soft non tender, non distended Extremities:++ edema Neurology: Non focal Skin: no rash  Pertinent Labs/Radiology:    Latest Ref Rng & Units 06/07/2022    3:32 PM 01/23/2022   12:23 PM 10/04/2021   11:07 AM  CBC  WBC 4.0 - 10.5 K/uL 9.5  9.5  8.5   Hemoglobin 13.0 - 17.0 g/dL 14.2  13.9  14.0   Hematocrit 39.0 - 52.0 % 45.1  42.1  42.8   Platelets 150 - 400 K/uL 214  149  150     Lab Results  Component Value Date   NA 140 06/09/2022   K 4.2 06/09/2022   CL 97 (L) 06/09/2022   CO2 31 06/09/2022      Assessment/Plan: Acute hypoxic respiratory failure due to COPD exacerbation and acute on chronic HFpEF Much better-continue to titrate down FiO2 as tolerated   Continue to treat underlying etiology with steroids/bronchodilators/diuretics.     COPD exacerbation Rapid improvement overnight Stop steroids Continue bronchodilators  Community-acquired PNA Afebrile-looks overall clinically improved  Continue Rocephin/Zithromax-suspect 5 days of antibiotics sufficient. Marland Kitchen   HFpEF exacerbation Has some chronic lower extremity edema at baseline -that had worsened prior to this hospitalization. Significant improvement overnight with IV diuretics Although improved-still with excessive volume-continue IV Lasix for 1 additional day. Continue to follow electrolytes/weights/daily intake/output.  History of CAD s/p CABG in 2011 No anginal symptoms/chest pain   History of severe aortic stenosis-s/p TAVR 2021 Updated echo with normal valve structure/position/function.   PAF Sinus rhythm Continue Eliquis Telemetry monitoring   HTN Continue ARB Follow and optimize   DM-2 with steroid-induced hyperglycemia (A1c 7.1 on 1/4) CBGs on the higher side-but since steroids have been discontinued-suspect will downtrend soon Change SSI to moderate scale  Recent Labs    06/08/22 1713 06/08/22 1959 06/09/22 0720  GLUCAP 236* 372* 247*     Hypothyroidism Continue Synthroid  Morbid Obesity: Estimated body mass index is 39.59 kg/m as calculated from the following:   Height as of 01/23/22: 5' 6.5" (1.689 m).   Weight as of 01/23/22: 112.9 kg.   Code status:   Code Status: DNR   DVT Prophylaxis: apixaban (ELIQUIS) tablet 5 mg    Family Communication: None at bedside   Disposition Plan: Status is: Inpatient Remains inpatient appropriate because: Severity of illness   Planned Discharge  Destination:Home likely on 1/6 if clinical improvement continues.   Diet: Diet Order             Diet heart healthy/carb modified Room service appropriate? Yes; Fluid consistency: Thin; Fluid restriction: 1500 mL Fluid  Diet effective now                      Antimicrobial agents: Anti-infectives (From admission, onward)    Start     Dose/Rate Route Frequency Ordered Stop   06/09/22 0800  cefTRIAXone (ROCEPHIN) 2 g in sodium chloride 0.9 % 100 mL IVPB        2 g 200 mL/hr over 30 Minutes Intravenous Every 24 hours 06/08/22 1226 06/13/22 0759   06/09/22 0800  azithromycin (ZITHROMAX) 500 mg in sodium chloride 0.9 % 250 mL IVPB  Status:  Discontinued        500 mg 250 mL/hr over 60 Minutes Intravenous Every 24 hours 06/08/22 1226 06/09/22 0734   06/09/22 0800  azithromycin (ZITHROMAX) 500 mg in sodium chloride 0.9 % 250 mL IVPB        500 mg 250 mL/hr over 60 Minutes Intravenous Every 24 hours 06/09/22 0734 06/11/22 0829   06/08/22 0830  cefTRIAXone (ROCEPHIN) 1 g in sodium chloride 0.9 % 100 mL IVPB        1 g 200 mL/hr over 30 Minutes Intravenous  Once 06/08/22 0824 06/08/22 0943   06/08/22 0830  azithromycin (ZITHROMAX) 500 mg in sodium chloride 0.9 % 250 mL IVPB        500 mg 250 mL/hr over 60 Minutes Intravenous  Once 06/08/22 0824 06/08/22 1104        MEDICATIONS: Scheduled Meds:  apixaban  5 mg Oral BID   atorvastatin  40 mg Oral Daily   dapagliflozin propanediol  10 mg Oral Daily   fluticasone  2 spray Each Nare Daily   fluticasone furoate-vilanterol  1 puff Inhalation BID   And   umeclidinium bromide  1 puff Inhalation Daily   furosemide  60 mg Intravenous Q12H   guaiFENesin  600 mg Oral BID   insulin aspart  0-9 Units Subcutaneous TID WC   [START ON 06/10/2022] irbesartan  75 mg Oral Daily   levothyroxine  150 mcg Oral Daily   loratadine  10 mg Oral Daily   potassium chloride SA  20 mEq Oral Daily   sodium chloride flush  3 mL Intravenous Q12H   Continuous Infusions:  sodium chloride     azithromycin 500 mg (06/09/22 0939)   cefTRIAXone (ROCEPHIN)  IV 2 g (06/09/22 0830)   PRN Meds:.sodium chloride, acetaminophen **OR** acetaminophen, albuterol, hydrALAZINE, ipratropium-albuterol, meclizine, nitroGLYCERIN,  ondansetron **OR** ondansetron (ZOFRAN) IV, polyethylene glycol, sodium chloride flush   I have personally reviewed following labs and imaging studies  LABORATORY DATA: CBC: Recent Labs  Lab 06/07/22 1532  WBC 9.5  NEUTROABS 6.9  HGB 14.2  HCT 45.1  MCV 94.7  PLT 876    Basic Metabolic Panel: Recent Labs  Lab 06/08/22 0119 06/09/22 0410  NA 138 140  K 4.0 4.2  CL 97* 97*  CO2 31 31  GLUCOSE 250* 247*  BUN 12 22  CREATININE 0.85 1.07  CALCIUM 8.8* 8.7*    GFR: CrCl cannot be calculated (Unknown ideal weight.).  Liver Function Tests: Recent Labs  Lab 06/08/22 0119  AST 35  ALT 34  ALKPHOS 74  BILITOT 0.6  PROT 7.5  ALBUMIN 3.3*   No results for input(s): "LIPASE", "  AMYLASE" in the last 168 hours. No results for input(s): "AMMONIA" in the last 168 hours.  Coagulation Profile: No results for input(s): "INR", "PROTIME" in the last 168 hours.  Cardiac Enzymes: No results for input(s): "CKTOTAL", "CKMB", "CKMBINDEX", "TROPONINI" in the last 168 hours.  BNP (last 3 results) No results for input(s): "PROBNP" in the last 8760 hours.  Lipid Profile: No results for input(s): "CHOL", "HDL", "LDLCALC", "TRIG", "CHOLHDL", "LDLDIRECT" in the last 72 hours.  Thyroid Function Tests: No results for input(s): "TSH", "T4TOTAL", "FREET4", "T3FREE", "THYROIDAB" in the last 72 hours.  Anemia Panel: No results for input(s): "VITAMINB12", "FOLATE", "FERRITIN", "TIBC", "IRON", "RETICCTPCT" in the last 72 hours.  Urine analysis:    Component Value Date/Time   COLORURINE YELLOW 01/16/2020 1002   APPEARANCEUR CLEAR 01/16/2020 1002   APPEARANCEUR Clear 09/13/2016 0849   LABSPEC 1.017 01/16/2020 1002   PHURINE 5.0 01/16/2020 1002   GLUCOSEU 50 (A) 01/16/2020 1002   HGBUR NEGATIVE 01/16/2020 1002   [place] 01/16/2020 1002   BILIRUBINUR Negative 09/13/2016 0849   KETONESUR NEGATIVE 01/16/2020 1002   PROTEINUR NEGATIVE 01/16/2020 1002   UROBILINOGEN 1.0  09/30/2009 2108   NITRITE NEGATIVE 01/16/2020 1002   LEUKOCYTESUR NEGATIVE 01/16/2020 1002    Sepsis Labs: Lactic Acid, Venous No results found for: "LATICACIDVEN"  MICROBIOLOGY: Recent Results (from the past 240 hour(s))  Resp panel by RT-PCR (RSV, Flu A&B, Covid) Anterior Nasal Swab     Status: None   Collection Time: 06/07/22  2:59 PM   Specimen: Anterior Nasal Swab  Result Value Ref Range Status   SARS Coronavirus 2 by RT PCR NEGATIVE NEGATIVE Final    Comment: (NOTE) SARS-CoV-2 target nucleic acids are NOT DETECTED.  The SARS-CoV-2 RNA is generally detectable in upper respiratory specimens during the acute phase of infection. The lowest concentration of SARS-CoV-2 viral copies this assay can detect is 138 copies/mL. A negative result does not preclude SARS-Cov-2 infection and should not be used as the sole basis for treatment or other patient management decisions. A negative result may occur with  improper specimen collection/handling, submission of specimen other than nasopharyngeal swab, presence of viral mutation(s) within the areas targeted by this assay, and inadequate number of viral copies(<138 copies/mL). A negative result must be combined with clinical observations, patient history, and epidemiological information. The expected result is Negative.  Fact Sheet for Patients:  EntrepreneurPulse.com.au  Fact Sheet for Healthcare Providers:  IncredibleEmployment.be  This test is no t yet approved or cleared by the [place] FDA and  has been authorized for detection and/or diagnosis of SARS-CoV-2 by FDA under an Emergency Use Authorization (EUA). This EUA will remain  in effect (meaning this test can be used) for the duration of the COVID-19 declaration under Section 564(b)(1) of the Act, 21 U.S.C.section 360bbb-3(b)(1), unless the authorization is terminated  or revoked sooner.       Influenza A by PCR NEGATIVE NEGATIVE  Final   Influenza B by PCR NEGATIVE NEGATIVE Final    Comment: (NOTE) The Xpert Xpress SARS-CoV-2/FLU/RSV plus assay is intended as an aid in the diagnosis of influenza from Nasopharyngeal swab specimens and should not be used as a sole basis for treatment. Nasal washings and aspirates are unacceptable for Xpert Xpress SARS-CoV-2/FLU/RSV testing.  Fact Sheet for Patients: EntrepreneurPulse.com.au  Fact Sheet for Healthcare Providers: IncredibleEmployment.be  This test is not yet approved or cleared by the [place] FDA and has been authorized for detection and/or diagnosis of SARS-CoV-2 by FDA under an  Emergency Use Authorization (EUA). This EUA will remain in effect (meaning this test can be used) for the duration of the COVID-19 declaration under Section 564(b)(1) of the Act, 21 U.S.C. section 360bbb-3(b)(1), unless the authorization is terminated or revoked.     Resp Syncytial Virus by PCR NEGATIVE NEGATIVE Final    Comment: (NOTE) Fact Sheet for Patients: EntrepreneurPulse.com.au  Fact Sheet for Healthcare Providers: IncredibleEmployment.be  This test is not yet approved or cleared by the [place] FDA and has been authorized for detection and/or diagnosis of SARS-CoV-2 by FDA under an Emergency Use Authorization (EUA). This EUA will remain in effect (meaning this test can be used) for the duration of the COVID-19 declaration under Section 564(b)(1) of the Act, 21 U.S.C. section 360bbb-3(b)(1), unless the authorization is terminated or revoked.  Performed at [facility], [place] [address], [place],  [address]     RADIOLOGY STUDIES/RESULTS: ECHOCARDIOGRAM COMPLETE  Result Date: 06/08/2022    ECHOCARDIOGRAM REPORT   Patient Name:   Matthew May Date of Exam: 06/08/2022 Medical Rec #:  [id number]        Height:       66.5 in Accession #:    [id number]       Weight:       249.0 lb  Date of Birth:  03-Jul-1945         BSA:          2.207 m Patient Age:    89 years         BP:           128/56 mmHg Patient Gender: M                HR:           92 bpm. Exam Location:  Inpatient Procedure: 2D Echo, Cardiac Doppler, Color Doppler and Intracardiac            Opacification Agent Indications:    CHF-Acute Diastolic C94.70  History:        Patient has prior history of Echocardiogram examinations, most                 recent 01/31/2021. CAD, COPD, Aortic Valve Disease,                 Arrythmias:Atrial Fibrillation; Risk Factors:Hypertension,                 Dyslipidemia and Diabetes.                 Aortic Valve: 29 mm Edwards Sapien prosthetic, stented (TAVR)                 valve is present in the aortic position.  Sonographer:    Bernadene Person RDCS Referring Phys: [place]  Sonographer Comments: Pt sitting straight up for exam, unable to tolerate laying down. IMPRESSIONS  1. Left ventricular ejection fraction, by estimation, is 70 to 75%. The left ventricle has hyperdynamic function. The left ventricle has no regional wall motion abnormalities. There is mild asymmetric left ventricular hypertrophy of the basal-septal segment. Left ventricular diastolic parameters are indeterminate.  2. Right ventricular systolic function is mildly reduced. The right ventricular size is normal. Tricuspid regurgitation signal is inadequate for assessing PA pressure.  3. Right atrial size was mildly dilated.  4. The mitral valve is normal in structure. No evidence of mitral valve regurgitation. The mean mitral valve gradient is 8.0 mmHg with average heart rate  of 93 bpm. Suspect elevated gradient through mitral valve due to high flow state and not mitral stenosis, MVA normal (3.3 cm^2) by continuity equation  5. The aortic valve has been repaired/replaced. Aortic valve regurgitation is not visualized. 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 Echo findings are  consistent with normal structure and function of the aortic valve prosthesis. Aortic valve mean gradient measures 7.0 mmHg. FINDINGS  Left Ventricle: Left ventricular ejection fraction, by estimation, is 70 to 75%. The left ventricle has hyperdynamic function. The left ventricle has no regional wall motion abnormalities. Definity contrast agent was given IV to delineate the left ventricular endocardial borders. The left ventricular internal cavity size was small. There is mild asymmetric left ventricular hypertrophy of the basal-septal segment. Left ventricular diastolic parameters are indeterminate. Right Ventricle: The right ventricular size is normal. No increase in right ventricular wall thickness. Right ventricular systolic function is mildly reduced. Tricuspid regurgitation signal is inadequate for assessing PA pressure. Left Atrium: Left atrial size was normal in size. Right Atrium: Right atrial size was mildly dilated. Pericardium: Trivial pericardial effusion is present. Presence of epicardial fat layer. Mitral Valve: The mitral valve is normal in structure. No evidence of mitral valve regurgitation. MV peak gradient, 15.8 mmHg. The mean mitral valve gradient is 8.0 mmHg with average heart rate of 93 bpm. Tricuspid Valve: The tricuspid valve is normal in structure. Tricuspid valve regurgitation is trivial. Aortic Valve: The aortic valve has been repaired/replaced. Aortic valve regurgitation is not visualized. Aortic valve mean gradient measures 7.0 mmHg. Aortic valve peak gradient measures 10.3 mmHg. Aortic valve area, by VTI measures 3.60 cm. 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 Echo findings are consistent with normal structure and function of the aortic valve prosthesis. Pulmonic Valve: The pulmonic valve was not well visualized. Pulmonic valve regurgitation is not visualized. Aorta: The aortic root and ascending aorta are structurally normal,  with no evidence of dilitation. IAS/Shunts: The interatrial septum was not well visualized.  LEFT VENTRICLE PLAX 2D LVIDd:         3.70 cm      Diastology LVIDs:         2.40 cm      LV e' medial:    5.34 cm/s LV PW:         1.00 cm      LV E/e' medial:  21.3 LV IVS:        1.00 cm      LV e' lateral:   6.75 cm/s LVOT diam:     2.90 cm      LV E/e' lateral: 16.9 LV SV:         117 LV SV Index:   53 LVOT Area:     6.61 cm  LV Volumes (MOD) LV vol d, MOD A2C: 102.0 ml LV vol d, MOD A4C: 86.4 ml LV vol s, MOD A2C: 24.1 ml LV vol s, MOD A4C: 24.8 ml LV SV MOD A2C:     77.9 ml LV SV MOD A4C:     86.4 ml LV SV MOD BP:      70.0 ml RIGHT VENTRICLE RV S prime:     9.20 cm/s TAPSE (M-mode): 1.6 cm LEFT ATRIUM             Index        RIGHT ATRIUM  Index LA diam:        4.20 cm 1.90 cm/m   RA Area:     23.50 cm LA Vol (A2C):   55.0 ml 24.92 ml/m  RA Volume:   75.40 ml  34.16 ml/m LA Vol (A4C):   55.6 ml 25.19 ml/m LA Biplane Vol: 57.9 ml 26.23 ml/m  AORTIC VALVE AV Area (Vmax):    3.66 cm AV Area (Vmean):   3.06 cm AV Area (VTI):     3.60 cm AV Vmax:           160.67 cm/s AV Vmean:          119.667 cm/s AV VTI:            0.324 m AV Peak Grad:      10.3 mmHg AV Mean Grad:      7.0 mmHg LVOT Vmax:         89.10 cm/s LVOT Vmean:        55.500 cm/s LVOT VTI:          0.177 m LVOT/AV VTI ratio: 0.55  AORTA Ao Root diam: 3.30 cm Ao Asc diam:  3.30 cm MITRAL VALVE MV Area (PHT): 3.37 cm     SHUNTS MV Area VTI:   3.28 cm     Systemic VTI:  0.18 m MV Peak grad:  15.8 mmHg    Systemic Diam: 2.90 cm MV Mean grad:  8.0 mmHg MV Vmax:       1.99 m/s MV Vmean:      131.0 cm/s MV Decel Time: 225 msec MV E velocity: 114.00 cm/s MV A velocity: 177.00 cm/s MV E/A ratio:  0.64 Oswaldo Milian MD Electronically signed by Oswaldo Milian MD Signature Date/Time: 06/08/2022/2:53:21 PM    Final    DG Chest 2 View  Result Date: 06/07/2022 CLINICAL DATA:  SOB EXAM: CHEST - 2 VIEW COMPARISON:  01/20/20 CXR FINDINGS:  Status post median sternotomy and CABG. Postprocedural changes from TAVR. Pleural effusion. No pneumothorax. Hazy opacity at the left lung base is nonspecific and could represent atelectasis, but superimposed infection is difficult to exclude. No displaced rib fractures. Visualized upper abdomen is unremarkable. Vertebral body heights are maintained. IMPRESSION: Hazy opacity at the left lung base is nonspecific and could represent atelectasis, but superimposed infection is difficult to exclude. Electronically Signed   By: Marin Roberts M.D.   On: 06/07/2022 16:20     LOS: 1 day   Oren Binet, MD  [facility]    To contact the attending provider between 7A-7P or the covering provider during after hours 7P-7A, please log into the web site [email or web address] and access using universal [place] password for that web site. If you do not have the password, please call the hospital operator.  06/09/2022, 10:08 AM

## 2022-06-10 DIAGNOSIS — J441 Chronic obstructive pulmonary disease with (acute) exacerbation: Secondary | ICD-10-CM | POA: Diagnosis not present

## 2022-06-10 DIAGNOSIS — J9601 Acute respiratory failure with hypoxia: Secondary | ICD-10-CM | POA: Diagnosis not present

## 2022-06-10 DIAGNOSIS — I480 Paroxysmal atrial fibrillation: Secondary | ICD-10-CM

## 2022-06-10 DIAGNOSIS — I5033 Acute on chronic diastolic (congestive) heart failure: Secondary | ICD-10-CM | POA: Diagnosis not present

## 2022-06-10 LAB — GLUCOSE, CAPILLARY
Glucose-Capillary: 247 mg/dL — ABNORMAL HIGH (ref 70–99)
Glucose-Capillary: 194 mg/dL — ABNORMAL HIGH (ref 70–99)

## 2022-06-10 MED ORDER — AMOXICILLIN-POT CLAVULANATE 875-125 MG PO TABS: 1.0000 | ORAL_TABLET | Freq: Two times a day (BID) | ORAL | 0 refills | Status: AC

## 2022-06-10 MED ORDER — VALSARTAN 40 MG PO TABS: 40.0000 mg | ORAL_TABLET | Freq: Every day | ORAL | 1 refills | Status: DC

## 2022-06-10 MED ORDER — TORSEMIDE 20 MG PO TABS: 40.0000 mg | ORAL_TABLET | Freq: Every day | ORAL | 1 refills | Status: DC

## 2022-06-10 MED ORDER — TORSEMIDE 20 MG PO TABS
40.0000 mg | ORAL_TABLET | Freq: Every day | ORAL | Status: DC
  Administered 2022-06-10: 40 mg via ORAL
  Filled 2022-06-10: qty 2

## 2022-06-10 NOTE — Discharge Summary (Signed)
PATIENT DETAILS Name: Matthew May Age: 77 y.o. Sex: male Date of Birth: 1945-11-27 MRN: [id number]. Admitting Physician: Jonetta Osgood, MD UDJ:[id number], Theador Hawthorne, FNP  Admit Date: 06/07/2022 Discharge date: 06/10/2022  Recommendations for Outpatient Follow-up:  Follow up with PCP in 1-2 weeks Please obtain CMP/CBC in one week Qualifies for home O2-please reassess at next visit Outpatient referral to pulmonology-epic referral sent.  Admitted From:  Home  Disposition: Home health   Discharge Condition: fair  CODE STATUS:   Code Status: DNR   Diet recommendation:  Diet Order             Diet - low sodium heart healthy           Diet Carb Modified           Diet heart healthy/carb modified Room service appropriate? Yes; Fluid consistency: Thin; Fluid restriction: 1500 mL Fluid  Diet effective now                    Brief Summary: Patient is a 77 y.o.  male with history of HFpEF, CAD s/p CABG, severe AAS-s/p TAVR, PAF on Eliquis, COPD who presented with shortness of breath/cough/worsening lower extremity edema-was found to have PNA provoking COPD exacerbation.   Significant events: 1/3>> admit to [facility]-COPD exacerbation-PNA-volume overload.   Significant studies: 1/3>> CXR: Hazy opacity in left lung base 1/3>> echo: EF 70-75%, prosthetic aortic valve with normal structure/function   Significant microbiology data: 1/3>> COVID/influenza/RSV PCR: Negative   Procedures: None   Consults: None  Brief Hospital Course: Acute hypoxic respiratory failure due to COPD exacerbation and acute on chronic HFpEF Significantly better with steroids/bronchodilators/empiric antibiotics. Titrated to room air at rest, but appears to desaturate to the low 80s with ambulation.  Qualifies for home O2-will ask CM to arrange.     COPD exacerbation Rapid improvement  No longer on steroids Continue bronchodilators.   Community-acquired PNA Afebrile-looks overall clinically  improved  Treated with Rocephin/Zithromax-we will transition to Augmentin on discharge.  Plan on total 5 days of treatment.   HFpEF exacerbation Has some chronic lower extremity edema at baseline -that had worsened prior to this hospitalization. Significant improvement with IV diuretics-has been transitioned to oral diuretics on discharge. Counseled extensively regarding importance of compliance to diuretics and fluid restriction.      History of CAD s/p CABG in 2011 No anginal symptoms/chest pain   History of severe aortic stenosis-s/p TAVR 2021 Updated echo with normal valve structure/position/function.   PAF Sinus rhythm Continue Eliquis   HTN Continue ARB Follow and optimize   DM-2 with steroid-induced hyperglycemia (A1c 7.1 on 1/4) CBGs managed with SSI Resume oral hypoglycemics on discharge.   Hypothyroidism Continue Synthroid   Morbid Obesity: Estimated body mass index is 39.59 kg/m as calculated from the following:   Height as of 01/23/22: 5' 6.5" (1.689 m).   Weight as of 01/23/22: 112.9 kg.   Note-ambulated by nursing staff in the room without any major issues.  At baseline walks with the help of a walker/cane.  Will arrange for home health services.  Discharge Diagnoses:  Principal Problem:   Acute respiratory failure with hypoxia (HCC) Active Problems:   Hyperlipidemia associated with type 2 diabetes mellitus ([place])   CORONARY ATHEROSCLEROSIS NATIVE CORONARY ARTERY   Hypothyroidism   Diabetes mellitus, type 2 (HCC)   Morbid obesity (HCC)   Severe aortic stenosis   Paroxysmal atrial fibrillation (HCC)   COPD with acute exacerbation (HCC)   S/P TAVR (  transcatheter aortic valve replacement)   Acute on chronic diastolic CHF (congestive heart failure) [facility])   Discharge Instructions:  Activity:  As tolerated with Full fall precautions use walker/cane & assistance as needed  Discharge Instructions     (HEART FAILURE PATIENTS) Call MD:  Anytime you have  any of the following symptoms: 1) 3 pound weight gain in 24 hours or 5 pounds in 1 week 2) shortness of breath, with or without a dry hacking cough 3) swelling in the hands, feet or stomach 4) if you have to sleep on extra pillows at night in order to breathe.   Complete by: As directed    Ambulatory referral to Pulmonology   Complete by: As directed    Reason for referral: Asthma/COPD   Call MD for:  difficulty breathing, headache or visual disturbances   Complete by: As directed    Diet - low sodium heart healthy   Complete by: As directed    Diet Carb Modified   Complete by: As directed    1500 cc fluid restriction   Discharge instructions   Complete by: As directed    Follow with Primary MD  Sharion Balloon, FNP in 1-2 weeks  Use home O2 24/7.  Please get a complete blood count and chemistry panel checked by your Primary MD at your next visit, and again as instructed by your Primary MD.  Get Medicines reviewed and adjusted: Please take all your medications with you for your next visit with your Primary MD  Laboratory/radiological data: Please request your Primary MD to go over all hospital tests and procedure/radiological results at the follow up, please ask your Primary MD to get all Hospital records sent to his/her office.  In some cases, they will be blood work, cultures and biopsy results pending at the time of your discharge. Please request that your primary care M.D. follows up on these results.  Also Note the following: If you experience worsening of your admission symptoms, develop shortness of breath, life threatening emergency, suicidal or homicidal thoughts you must seek medical attention immediately by calling 911 or calling your MD immediately  if symptoms less severe.  You must read complete instructions/literature along with all the possible adverse reactions/side effects for all the Medicines you take and that have been prescribed to you. Take any new Medicines  after you have completely understood and accpet all the possible adverse reactions/side effects.   Do not drive when taking Pain medications or sleeping medications (Benzodaizepines)  Do not take more than prescribed Pain, Sleep and Anxiety Medications. It is not advisable to combine anxiety,sleep and pain medications without talking with your primary care practitioner  Special Instructions: If you have smoked or chewed Tobacco  in the last 2 yrs please stop smoking, stop any regular Alcohol  and or any Recreational drug use.  Wear Seat belts while driving.  Please note: You were cared for by a hospitalist during your hospital stay. Once you are discharged, your primary care physician will handle any further medical issues. Please note that NO REFILLS for any discharge medications will be authorized once you are discharged, as it is imperative that you return to your primary care physician (or establish a relationship with a primary care physician if you do not have one) for your post hospital discharge needs so that they can reassess your need for medications and monitor your lab values.   Increase activity slowly   Complete by: As directed  Allergies as of 06/10/2022       Reactions   Prednisone Other (See Comments)   Pt states "sugar went to 580"   Januvia [sitagliptin] Nausea And Vomiting   Jardiance [empagliflozin] Rash   Lisinopril Cough        Medication List     TAKE these medications    acetaminophen 325 MG tablet Commonly known as: TYLENOL Take 2 tablets (650 mg total) by mouth every 6 (six) hours as needed for mild pain (or Fever >/= 101).   albuterol 108 (90 Base) MCG/ACT inhaler Commonly known as: VENTOLIN HFA INHALE 2 PUFFS BY MOUTH EVERY 6 HOURS AS NEEDED FOR WHEEZING AND FOR SHORTNESS OF BREATH   amoxicillin-clavulanate 875-125 MG tablet Commonly known as: AUGMENTIN Take 1 tablet by mouth 2 (two) times daily for 3 days.   atorvastatin 40 MG  tablet Commonly known as: LIPITOR TAKE 1 TABLET BY MOUTH DAILY   Breztri Aerosphere 160-9-4.8 MCG/ACT Aero Generic drug: Budeson-Glycopyrrol-Formoterol Inhale 2 puffs into the lungs 2 (two) times daily.   cetirizine 10 MG tablet Commonly known as: ZYRTEC Take 1 tablet (10 mg total) by mouth daily.   cholecalciferol 25 MCG (1000 UNIT) tablet Commonly known as: VITAMIN D3 Take 1,000 Units by mouth daily.   colchicine 0.6 MG tablet TAKE 2 TABLETS BY MOUTH NOW, THEN TAKE 1 ADDITIONAL TABLET IN 1 HOUR. TAKE 1 TABLET ONCE DAILY THEREAFTER UNTIL SYMPTOMS RESOVE   dapagliflozin propanediol 10 MG Tabs tablet Commonly known as: FARXIGA Take 1 tablet (10 mg total) by mouth daily.   Eliquis 5 MG Tabs tablet Generic drug: apixaban Take 1 tablet by mouth twice daily   glimepiride 2 MG tablet Commonly known as: AMARYL TAKE 1 TABLET BY MOUTH DAILY  WITH BREAKFAST   levothyroxine 150 MCG tablet Commonly known as: SYNTHROID TAKE 1 TABLET BY MOUTH DAILY   meclizine 25 MG tablet Commonly known as: ANTIVERT TAKE 1 TABLET BY MOUTH THREE TIMES DAILY AS NEEDED FOR DIZZINESS   metFORMIN 1000 MG tablet Commonly known as: GLUCOPHAGE TAKE 1 TABLET BY MOUTH TWICE  DAILY   nitroGLYCERIN 0.4 MG SL tablet Commonly known as: NITROSTAT DISSOLVE ONE TABLET UNDER THE TONGUE EVERY 5 MINUTES AS NEEDED FOR CHEST PAIN.  DO NOT EXCEED A TOTAL OF 3 DOSES IN 15 MINUTES   OneTouch Delica Plus [id number] Misc CHECK BLOOD SUGAR DAILY Dx E11.9   OneTouch Verio test strip Generic drug: glucose blood CHECK BLOOD SUGAR DAILY Dx E11.9   potassium chloride SA 20 MEQ tablet Commonly known as: KLOR-CON M TAKE 1 TABLET BY MOUTH DAILY   torsemide 20 MG tablet Commonly known as: DEMADEX Take 2 tablets (40 mg total) by mouth daily.   valsartan 40 MG tablet Commonly known as: DIOVAN Take 1 tablet (40 mg total) by mouth daily.               Durable Medical Equipment  (From admission, onward)            Start     Ordered   06/10/22 0941  For home use only DME oxygen  Once       Question Answer Comment  Length of Need 6 Months   Mode or (Route) Nasal cannula   Liters per Minute 2   Frequency Continuous (stationary and portable oxygen unit needed)   Oxygen conserving device Yes   Oxygen delivery system Gas      06/10/22 0940  Follow-up Information     Sharion Balloon, FNP. Schedule an appointment as soon as possible for a visit in 1 week(s).   Specialty: Family Medicine Contact information: [place] [address] [phone]         [facility] Follow up.   Specialty: Pulmonology Why: Hospital follow up, Office will call with date/time, If you dont hear from them,please give them a call Contact information: [place] [address] [phone]               Allergies  Allergen Reactions   Prednisone Other (See Comments)    Pt states "sugar went to 580"   Januvia [Sitagliptin] Nausea And Vomiting   Jardiance [Empagliflozin] Rash   Lisinopril Cough     Other Procedures/Studies: ECHOCARDIOGRAM COMPLETE  Result Date: 06/08/2022    ECHOCARDIOGRAM REPORT   Patient Name:   TAHJAY BINION Date of Exam: 06/08/2022 Medical Rec #:  [id number]        Height:       66.5 in Accession #:    [id number]       Weight:       249.0 lb Date of Birth:  July 04, 1945         BSA:          2.207 m Patient Age:    46 years         BP:           128/56 mmHg Patient Gender: M                HR:           92 bpm. Exam Location:  Inpatient Procedure: 2D Echo, Cardiac Doppler, Color Doppler and Intracardiac            Opacification Agent Indications:    CHF-Acute Diastolic P82.42  History:        Patient has prior history of Echocardiogram examinations, most                 recent 01/31/2021. CAD, COPD, Aortic Valve Disease,                 Arrythmias:Atrial Fibrillation; Risk Factors:Hypertension,                  Dyslipidemia and Diabetes.                 Aortic Valve: 29 mm Edwards Sapien prosthetic, stented (TAVR)                 valve is present in the aortic position.  Sonographer:    Bernadene Person RDCS Referring Phys: [place]  Sonographer Comments: Pt sitting straight up for exam, unable to tolerate laying down. IMPRESSIONS  1. Left ventricular ejection fraction, by estimation, is 70 to 75%. The left ventricle has hyperdynamic function. The left ventricle has no regional wall motion abnormalities. There is mild asymmetric left ventricular hypertrophy of the basal-septal segment. Left ventricular diastolic parameters are indeterminate.  2. Right ventricular systolic function is mildly reduced. The right ventricular size is normal. Tricuspid regurgitation signal is inadequate for assessing PA pressure.  3. Right atrial size was mildly dilated.  4. The mitral valve is normal in structure. No evidence of mitral valve regurgitation. The mean mitral valve gradient is 8.0 mmHg with average heart rate of 93 bpm. Suspect elevated gradient through mitral valve due to high flow state and  not mitral stenosis, MVA normal (3.3 cm^2) by continuity equation  5. The aortic valve has been repaired/replaced. Aortic valve regurgitation is not visualized. 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 Echo findings are consistent with normal structure and function of the aortic valve prosthesis. Aortic valve mean gradient measures 7.0 mmHg. FINDINGS  Left Ventricle: Left ventricular ejection fraction, by estimation, is 70 to 75%. The left ventricle has hyperdynamic function. The left ventricle has no regional wall motion abnormalities. Definity contrast agent was given IV to delineate the left ventricular endocardial borders. The left ventricular internal cavity size was small. There is mild asymmetric left ventricular hypertrophy of the basal-septal segment. Left ventricular  diastolic parameters are indeterminate. Right Ventricle: The right ventricular size is normal. No increase in right ventricular wall thickness. Right ventricular systolic function is mildly reduced. Tricuspid regurgitation signal is inadequate for assessing PA pressure. Left Atrium: Left atrial size was normal in size. Right Atrium: Right atrial size was mildly dilated. Pericardium: Trivial pericardial effusion is present. Presence of epicardial fat layer. Mitral Valve: The mitral valve is normal in structure. No evidence of mitral valve regurgitation. MV peak gradient, 15.8 mmHg. The mean mitral valve gradient is 8.0 mmHg with average heart rate of 93 bpm. Tricuspid Valve: The tricuspid valve is normal in structure. Tricuspid valve regurgitation is trivial. Aortic Valve: The aortic valve has been repaired/replaced. Aortic valve regurgitation is not visualized. Aortic valve mean gradient measures 7.0 mmHg. Aortic valve peak gradient measures 10.3 mmHg. Aortic valve area, by VTI measures 3.60 cm. 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 Echo findings are consistent with normal structure and function of the aortic valve prosthesis. Pulmonic Valve: The pulmonic valve was not well visualized. Pulmonic valve regurgitation is not visualized. Aorta: The aortic root and ascending aorta are structurally normal, with no evidence of dilitation. IAS/Shunts: The interatrial septum was not well visualized.  LEFT VENTRICLE PLAX 2D LVIDd:         3.70 cm      Diastology LVIDs:         2.40 cm      LV e' medial:    5.34 cm/s LV PW:         1.00 cm      LV E/e' medial:  21.3 LV IVS:        1.00 cm      LV e' lateral:   6.75 cm/s LVOT diam:     2.90 cm      LV E/e' lateral: 16.9 LV SV:         117 LV SV Index:   53 LVOT Area:     6.61 cm  LV Volumes (MOD) LV vol d, MOD A2C: 102.0 ml LV vol d, MOD A4C: 86.4 ml LV vol s, MOD A2C: 24.1 ml LV vol s, MOD A4C: 24.8 ml LV SV MOD A2C:      77.9 ml LV SV MOD A4C:     86.4 ml LV SV MOD BP:      70.0 ml RIGHT VENTRICLE RV S prime:     9.20 cm/s TAPSE (M-mode): 1.6 cm LEFT ATRIUM             Index        RIGHT ATRIUM           Index LA diam:        4.20 cm 1.90 cm/m  RA Area:     23.50 cm LA Vol (A2C):   55.0 ml 24.92 ml/m  RA Volume:   75.40 ml  34.16 ml/m LA Vol (A4C):   55.6 ml 25.19 ml/m LA Biplane Vol: 57.9 ml 26.23 ml/m  AORTIC VALVE AV Area (Vmax):    3.66 cm AV Area (Vmean):   3.06 cm AV Area (VTI):     3.60 cm AV Vmax:           160.67 cm/s AV Vmean:          119.667 cm/s AV VTI:            0.324 m AV Peak Grad:      10.3 mmHg AV Mean Grad:      7.0 mmHg LVOT Vmax:         89.10 cm/s LVOT Vmean:        55.500 cm/s LVOT VTI:          0.177 m LVOT/AV VTI ratio: 0.55  AORTA Ao Root diam: 3.30 cm Ao Asc diam:  3.30 cm MITRAL VALVE MV Area (PHT): 3.37 cm     SHUNTS MV Area VTI:   3.28 cm     Systemic VTI:  0.18 m MV Peak grad:  15.8 mmHg    Systemic Diam: 2.90 cm MV Mean grad:  8.0 mmHg MV Vmax:       1.99 m/s MV Vmean:      131.0 cm/s MV Decel Time: 225 msec MV E velocity: 114.00 cm/s MV A velocity: 177.00 cm/s MV E/A ratio:  0.64 Oswaldo Milian MD Electronically signed by Oswaldo Milian MD Signature Date/Time: 06/08/2022/2:53:21 PM    Final    DG Chest 2 View  Result Date: 06/07/2022 CLINICAL DATA:  SOB EXAM: CHEST - 2 VIEW COMPARISON:  01/20/20 CXR FINDINGS: Status post median sternotomy and CABG. Postprocedural changes from TAVR. Pleural effusion. No pneumothorax. Hazy opacity at the left lung base is nonspecific and could represent atelectasis, but superimposed infection is difficult to exclude. No displaced rib fractures. Visualized upper abdomen is unremarkable. Vertebral body heights are maintained. IMPRESSION: Hazy opacity at the left lung base is nonspecific and could represent atelectasis, but superimposed infection is difficult to exclude. Electronically Signed   By: Marin Roberts M.D.   On: 06/07/2022 16:20      TODAY-DAY OF DISCHARGE:  Subjective:   Ulice Brilliant today has no headache,no chest abdominal pain,no new weakness tingling or numbness, feels much better wants to go home today.   Objective:   Blood pressure 98/68, pulse 98, temperature 98.6 F (37 C), temperature source Oral, resp. rate 16, SpO2 (!) 80 %.  Intake/Output Summary (Last 24 hours) at 06/10/2022 0952 Last data filed at 06/10/2022 0231 Gross per 24 hour  Intake 360 ml  Output 3400 ml  Net -3040 ml   There were no vitals filed for this visit.  Exam: Awake Alert, Oriented *3, No new F.N deficits, Normal affect [place].AT,PERRAL Supple Neck,No JVD, No cervical lymphadenopathy appriciated.  Symmetrical Chest wall movement, Good air movement bilaterally, CTAB RRR,No Gallops,Rubs or new Murmurs, No Parasternal Heave +ve B.Sounds, Abd Soft, Non tender, No organomegaly appriciated, No rebound -guarding or rigidity. No Cyanosis, Clubbing or edema, No new Rash or bruise   PERTINENT RADIOLOGIC STUDIES: ECHOCARDIOGRAM COMPLETE  Result Date: 06/08/2022    ECHOCARDIOGRAM REPORT   Patient Name:   KEIR VIERNES Date of Exam: 06/08/2022 Medical Rec #:  [id number]        Height:  66.5 in Accession #:    [id number]       Weight:       249.0 lb Date of Birth:  [birth year]         BSA:          2.207 m Patient Age:    49 years         BP:           128/56 mmHg Patient Gender: M                HR:           92 bpm. Exam Location:  Inpatient Procedure: 2D Echo, Cardiac Doppler, Color Doppler and Intracardiac            Opacification Agent Indications:    CHF-Acute Diastolic G66.59  History:        Patient has prior history of Echocardiogram examinations, most                 recent 01/31/2021. CAD, COPD, Aortic Valve Disease,                 Arrythmias:Atrial Fibrillation; Risk Factors:Hypertension,                 Dyslipidemia and Diabetes.                 Aortic Valve: 29 mm Edwards Sapien prosthetic, stented (TAVR)                 valve is  present in the aortic position.  Sonographer:    Bernadene Person RDCS Referring Phys: [place]  Sonographer Comments: Pt sitting straight up for exam, unable to tolerate laying down. IMPRESSIONS  1. Left ventricular ejection fraction, by estimation, is 70 to 75%. The left ventricle has hyperdynamic function. The left ventricle has no regional wall motion abnormalities. There is mild asymmetric left ventricular hypertrophy of the basal-septal segment. Left ventricular diastolic parameters are indeterminate.  2. Right ventricular systolic function is mildly reduced. The right ventricular size is normal. Tricuspid regurgitation signal is inadequate for assessing PA pressure.  3. Right atrial size was mildly dilated.  4. The mitral valve is normal in structure. No evidence of mitral valve regurgitation. The mean mitral valve gradient is 8.0 mmHg with average heart rate of 93 bpm. Suspect elevated gradient through mitral valve due to high flow state and not mitral stenosis, MVA normal (3.3 cm^2) by continuity equation  5. The aortic valve has been repaired/replaced. Aortic valve regurgitation is not visualized. 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 Echo findings are consistent with normal structure and function of the aortic valve prosthesis. Aortic valve mean gradient measures 7.0 mmHg. FINDINGS  Left Ventricle: Left ventricular ejection fraction, by estimation, is 70 to 75%. The left ventricle has hyperdynamic function. The left ventricle has no regional wall motion abnormalities. Definity contrast agent was given IV to delineate the left ventricular endocardial borders. The left ventricular internal cavity size was small. There is mild asymmetric left ventricular hypertrophy of the basal-septal segment. Left ventricular diastolic parameters are indeterminate. Right Ventricle: The right ventricular size is normal. No increase in right ventricular wall  thickness. Right ventricular systolic function is mildly reduced. Tricuspid regurgitation signal is inadequate for assessing PA pressure. Left Atrium: Left atrial size was normal in size. Right Atrium: Right atrial size was mildly dilated. Pericardium: Trivial pericardial effusion is present. Presence of epicardial  fat layer. Mitral Valve: The mitral valve is normal in structure. No evidence of mitral valve regurgitation. MV peak gradient, 15.8 mmHg. The mean mitral valve gradient is 8.0 mmHg with average heart rate of 93 bpm. Tricuspid Valve: The tricuspid valve is normal in structure. Tricuspid valve regurgitation is trivial. Aortic Valve: The aortic valve has been repaired/replaced. Aortic valve regurgitation is not visualized. Aortic valve mean gradient measures 7.0 mmHg. Aortic valve peak gradient measures 10.3 mmHg. Aortic valve area, by VTI measures 3.60 cm. 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 Echo findings are consistent with normal structure and function of the aortic valve prosthesis. Pulmonic Valve: The pulmonic valve was not well visualized. Pulmonic valve regurgitation is not visualized. Aorta: The aortic root and ascending aorta are structurally normal, with no evidence of dilitation. IAS/Shunts: The interatrial septum was not well visualized.  LEFT VENTRICLE PLAX 2D LVIDd:         3.70 cm      Diastology LVIDs:         2.40 cm      LV e' medial:    5.34 cm/s LV PW:         1.00 cm      LV E/e' medial:  21.3 LV IVS:        1.00 cm      LV e' lateral:   6.75 cm/s LVOT diam:     2.90 cm      LV E/e' lateral: 16.9 LV SV:         117 LV SV Index:   53 LVOT Area:     6.61 cm  LV Volumes (MOD) LV vol d, MOD A2C: 102.0 ml LV vol d, MOD A4C: 86.4 ml LV vol s, MOD A2C: 24.1 ml LV vol s, MOD A4C: 24.8 ml LV SV MOD A2C:     77.9 ml LV SV MOD A4C:     86.4 ml LV SV MOD BP:      70.0 ml RIGHT VENTRICLE RV S prime:     9.20 cm/s TAPSE (M-mode): 1.6 cm  LEFT ATRIUM             Index        RIGHT ATRIUM           Index LA diam:        4.20 cm 1.90 cm/m   RA Area:     23.50 cm LA Vol (A2C):   55.0 ml 24.92 ml/m  RA Volume:   75.40 ml  34.16 ml/m LA Vol (A4C):   55.6 ml 25.19 ml/m LA Biplane Vol: 57.9 ml 26.23 ml/m  AORTIC VALVE AV Area (Vmax):    3.66 cm AV Area (Vmean):   3.06 cm AV Area (VTI):     3.60 cm AV Vmax:           160.67 cm/s AV Vmean:          119.667 cm/s AV VTI:            0.324 m AV Peak Grad:      10.3 mmHg AV Mean Grad:      7.0 mmHg LVOT Vmax:         89.10 cm/s LVOT Vmean:        55.500 cm/s LVOT VTI:          0.177 m LVOT/AV VTI ratio: 0.55  AORTA Ao Root diam: 3.30 cm Ao Asc diam:  3.30 cm MITRAL VALVE MV Area (PHT): 3.37 cm     SHUNTS MV Area VTI:   3.28 cm     Systemic VTI:  0.18 m MV Peak grad:  15.8 mmHg    Systemic Diam: 2.90 cm MV Mean grad:  8.0 mmHg MV Vmax:       1.99 m/s MV Vmean:      131.0 cm/s MV Decel Time: 225 msec MV E velocity: 114.00 cm/s MV A velocity: 177.00 cm/s MV E/A ratio:  0.64 Oswaldo Milian MD Electronically signed by Oswaldo Milian MD Signature Date/Time: 06/08/2022/2:53:21 PM    Final      PERTINENT LAB RESULTS: CBC: Recent Labs    06/07/22 1532  WBC 9.5  HGB 14.2  HCT 45.1  PLT 214   CMET CMP     Component Value Date/Time   NA 140 06/09/2022 0410   NA 138 01/23/2022 1223   K 4.2 06/09/2022 0410   CL 97 (L) 06/09/2022 0410   CO2 31 06/09/2022 0410   GLUCOSE 247 (H) 06/09/2022 0410   BUN 22 06/09/2022 0410   BUN 12 01/23/2022 1223   CREATININE 1.07 06/09/2022 0410   CREATININE 0.84 12/26/2012 1310   CALCIUM 8.7 (L) 06/09/2022 0410   PROT 7.5 06/08/2022 0119   PROT 7.2 01/23/2022 1223   ALBUMIN 3.3 (L) 06/08/2022 0119   ALBUMIN 4.2 01/23/2022 1223   AST 35 06/08/2022 0119   ALT 34 06/08/2022 0119   ALKPHOS 74 06/08/2022 0119   BILITOT 0.6 06/08/2022 0119   BILITOT 0.4 01/23/2022 1223   GFRNONAA >60 06/09/2022 0410   GFRNONAA >89 12/26/2012 1310   GFRAA 79  05/11/2020 1029   GFRAA >89 12/26/2012 1310    GFR CrCl cannot be calculated (Unknown ideal weight.). No results for input(s): "LIPASE", "AMYLASE" in the last 72 hours. No results for input(s): "CKTOTAL", "CKMB", "CKMBINDEX", "TROPONINI" in the last 72 hours. Invalid input(s): "POCBNP" No results for input(s): "DDIMER" in the last 72 hours. Recent Labs    06/08/22 1442  HGBA1C 7.2*   No results for input(s): "CHOL", "HDL", "LDLCALC", "TRIG", "CHOLHDL", "LDLDIRECT" in the last 72 hours. No results for input(s): "TSH", "T4TOTAL", "T3FREE", "THYROIDAB" in the last 72 hours.  Invalid input(s): "FREET3" No results for input(s): "VITAMINB12", "FOLATE", "FERRITIN", "TIBC", "IRON", "RETICCTPCT" in the last 72 hours. Coags: No results for input(s): "INR" in the last 72 hours.  Invalid input(s): "PT" Microbiology: Recent Results (from the past 240 hour(s))  Resp panel by RT-PCR (RSV, Flu A&B, Covid) Anterior Nasal Swab     Status: None   Collection Time: 06/07/22  2:59 PM   Specimen: Anterior Nasal Swab  Result Value Ref Range Status   SARS Coronavirus 2 by RT PCR NEGATIVE NEGATIVE Final    Comment: (NOTE) SARS-CoV-2 target nucleic acids are NOT DETECTED.  The SARS-CoV-2 RNA is generally detectable in upper respiratory specimens during the acute phase of infection. The lowest concentration of SARS-CoV-2 viral copies this assay can detect is 138 copies/mL. A negative result does not preclude SARS-Cov-2 infection and should not be used as the sole basis for treatment or other patient management decisions. A negative result may occur with  improper specimen collection/handling, submission of specimen other than nasopharyngeal swab, presence of viral mutation(s) within the areas targeted by this assay, and inadequate number of viral copies(<138 copies/mL). A negative result must be combined with clinical observations, patient history, and epidemiological information. The expected  result is Negative.  Fact Sheet for Patients:  EntrepreneurPulse.com.au  Fact Sheet for Healthcare Providers:  IncredibleEmployment.be  This test is no t yet approved or cleared by the [place] FDA and  has been authorized for detection and/or diagnosis of SARS-CoV-2 by FDA under an Emergency Use Authorization (EUA). This EUA will remain  in effect (meaning this test can be used) for the duration of the COVID-19 declaration under Section 564(b)(1) of the Act, 21 U.S.C.section 360bbb-3(b)(1), unless the authorization is terminated  or revoked sooner.       Influenza A by PCR NEGATIVE NEGATIVE Final   Influenza B by PCR NEGATIVE NEGATIVE Final    Comment: (NOTE) The Xpert Xpress SARS-CoV-2/FLU/RSV plus assay is intended as an aid in the diagnosis of influenza from Nasopharyngeal swab specimens and should not be used as a sole basis for treatment. Nasal washings and aspirates are unacceptable for Xpert Xpress SARS-CoV-2/FLU/RSV testing.  Fact Sheet for Patients: EntrepreneurPulse.com.au  Fact Sheet for Healthcare Providers: IncredibleEmployment.be  This test is not yet approved or cleared by the [place] FDA and has been authorized for detection and/or diagnosis of SARS-CoV-2 by FDA under an Emergency Use Authorization (EUA). This EUA will remain in effect (meaning this test can be used) for the duration of the COVID-19 declaration under Section 564(b)(1) of the Act, 21 U.S.C. section 360bbb-3(b)(1), unless the authorization is terminated or revoked.     Resp Syncytial Virus by PCR NEGATIVE NEGATIVE Final    Comment: (NOTE) Fact Sheet for Patients: EntrepreneurPulse.com.au  Fact Sheet for Healthcare Providers: IncredibleEmployment.be  This test is not yet approved or cleared by the [place] FDA and has been authorized for detection and/or diagnosis of  SARS-CoV-2 by FDA under an Emergency Use Authorization (EUA). This EUA will remain in effect (meaning this test can be used) for the duration of the COVID-19 declaration under Section 564(b)(1) of the Act, 21 U.S.C. section 360bbb-3(b)(1), unless the authorization is terminated or revoked.  Performed at [facility], [address] [place], [place] [address]     FURTHER DISCHARGE INSTRUCTIONS:  Get Medicines reviewed and adjusted: Please take all your medications with you for your next visit with your Primary MD  Laboratory/radiological data: Please request your Primary MD to go over all hospital tests and procedure/radiological results at the follow up, please ask your Primary MD to get all Hospital records sent to his/her office.  In some cases, they will be blood work, cultures and biopsy results pending at the time of your discharge. Please request that your primary care M.D. goes through all the records of your hospital data and follows up on these results.  Also Note the following: If you experience worsening of your admission symptoms, develop shortness of breath, life threatening emergency, suicidal or homicidal thoughts you must seek medical attention immediately by calling 911 or calling your MD immediately  if symptoms less severe.  You must read complete instructions/literature along with all the possible adverse reactions/side effects for all the Medicines you take and that have been prescribed to you. Take any new Medicines after you have completely understood and accpet all the possible adverse reactions/side effects.   Do not drive when taking Pain medications or sleeping medications (Benzodaizepines)  Do not take more than prescribed Pain, Sleep and Anxiety Medications. It is not advisable to combine anxiety,sleep and pain medications without talking with your primary care practitioner  Special Instructions: If you have smoked or chewed Tobacco  in the last 2  yrs please stop smoking, stop any regular  Alcohol  and or any Recreational drug use.  Wear Seat belts while driving.  Please note: You were cared for by a hospitalist during your hospital stay. Once you are discharged, your primary care physician will handle any further medical issues. Please note that NO REFILLS for any discharge medications will be authorized once you are discharged, as it is imperative that you return to your primary care physician (or establish a relationship with a primary care physician if you do not have one) for your post hospital discharge needs so that they can reassess your need for medications and monitor your lab values.  Total Time spent coordinating discharge including counseling, education and face to face time equals greater than 30 minutes.  SignedOren Binet 06/10/2022 9:52 AM

## 2022-06-10 NOTE — Plan of Care (Incomplete)
Pt A&OX4. BP 98/68 (BP Location: Right Arm)   Pulse 98   Temp 98.6 F (37 C) (Oral)   Resp 16   SpO2 (!) 80% Comment: During ambulation Removed IV, AVS reviewed, belongings returned and pt escorted off floor by staff/volunteer. Louanne Skye 06/10/22 10:57 AM

## 2022-06-10 NOTE — TOC Transition Note (Addendum)
Transition of Care [facility]) - CM/SW Discharge Note   Patient Details  Name: Matthew May MRN: [id number] Date of Birth: Nov 22, 1945  Transition of Care [facility]) CM/SW Contact:  Verdell Carmine, RN Phone Number: 06/10/2022, 9:52 AM   Clinical Narrative:     Md wrote orders for home health and oxygen. Messaged Rn as we need oxygen qualifiers documented. Patient to DC today. Home health ordered.  Spoke to Mrs. Kraft via phone. Introduced self and role. She wants a [place] agency that will work best with insurance. She is fine with [employer] for oxygen explained home health and oxygen, that they will contact her / Mr Valko to set up service Called RN to make sure oxygen qualifiers are obtained.  Brandon accepted for [facility], [facility] on the way with oxygen tank.  DC  Final next level of care: [place] with [facility] and DME Patient Goals and CMS Choice      Discharge Placement                         Discharge Plan and Services Additional resources added to the After Visit Summary for                  DME Arranged: Oxygen DME Agency: [facility] Date DME Agency Contacted: 06/10/22 Time DME Agency Contacted: [phone] Representative spoke with at DME Agency: Brenton [facility]ills HH Arranged: PT, OT          Social Determinants of Health ([place]) Interventions SDOH Screenings   Food Insecurity: No Food Insecurity (08/04/2021)  Housing: Low Risk  (08/04/2021)  Transportation Needs: No Transportation Needs (08/04/2021)  Alcohol Screen: Low Risk  (08/04/2021)  Depression (PHQ2-9): Low Risk  (10/04/2021)  Financial Resource Strain: Low Risk  (08/04/2021)  Physical Activity: Sufficiently Active (08/04/2021)  Social Connections: Moderately Isolated (08/04/2021)  Stress: No Stress Concern Present (08/04/2021)  Tobacco Use: Medium Risk (01/23/2022)     Readmission Risk Interventions     No data to display

## 2022-06-10 NOTE — Progress Notes (Addendum)
SATURATION QUALIFICATIONS: (This note is used to comply with regulatory documentation for home oxygen)  Patient Saturations on Room Air at Rest = 90%  Patient Saturations on Room Air while Ambulating = 80%  Patient Saturations on 2 Liters of oxygen while Ambulating = 88%  Please briefly explain why patient needs home oxyg pt easily becomes SOB during ambulation and with exertion Louanne Skye 06/10/22 10:34 AM

## 2022-06-12 ENCOUNTER — Telehealth: Payer: Self-pay

## 2022-06-12 NOTE — Patient Outreach (Signed)
  Care Coordination TOC Note Transition Care Management Unsuccessful Follow-up Telephone Call  Date of discharge and from where:  06/10/21 [facility] acute respiratory failure, acute on chronic HF  Attempts:  1st Attempt  Reason for unsuccessful TCM follow-up call:  Unable to leave message on all of listed number for pt and DPR wife. [name] RN, Jackquline [place], [facility] Management [phone]

## 2022-06-13 ENCOUNTER — Telehealth (INDEPENDENT_AMBULATORY_CARE_PROVIDER_SITE_OTHER): Payer: Medicare Other | Admitting: Family

## 2022-06-13 ENCOUNTER — Encounter: Payer: Self-pay | Admitting: Family

## 2022-06-13 ENCOUNTER — Telehealth: Payer: Self-pay

## 2022-06-13 DIAGNOSIS — J189 Pneumonia, unspecified organism: Secondary | ICD-10-CM

## 2022-06-13 DIAGNOSIS — J9601 Acute respiratory failure with hypoxia: Secondary | ICD-10-CM | POA: Diagnosis not present

## 2022-06-13 DIAGNOSIS — J449 Chronic obstructive pulmonary disease, unspecified: Secondary | ICD-10-CM

## 2022-06-13 DIAGNOSIS — Z09 Encounter for follow-up examination after completed treatment for conditions other than malignant neoplasm: Secondary | ICD-10-CM

## 2022-06-13 DIAGNOSIS — J441 Chronic obstructive pulmonary disease with (acute) exacerbation: Secondary | ICD-10-CM

## 2022-06-13 MED ORDER — BREZTRI AEROSPHERE 160-9-4.8 MCG/ACT IN AERO: 2.0000 | INHALATION_SPRAY | Freq: Two times a day (BID) | RESPIRATORY_TRACT | 11 refills | Status: DC

## 2022-06-13 NOTE — Progress Notes (Signed)
Virtual Visit  Note Due to COVID-19 pandemic this visit was conducted virtually. This visit type was conducted due to national recommendations for restrictions regarding the COVID-19 Pandemic (e.g. social distancing, sheltering in place) in an effort to limit this patient's exposure and mitigate transmission in our community. All issues noted in this document were discussed and addressed.  A physical exam was not performed with this format.  I connected with Matthew May on 06/13/22 at 10:20 AM by telephone and verified that I am speaking with the correct person using two identifiers. Matthew May is currently located at home and no one  is currently with him during visit. The provider, Evelina Dun, FNP is located in their office at time of visit.  I discussed the limitations, risks, security and privacy concerns of performing an evaluation and management service by telephone and the availability of in person appointments. I also discussed with the patient that there may be a patient responsible charge related to this service. The patient expressed understanding and agreed to proceed.   History and Present Illness:  PT calls the office today with hospital follow up. He went to the ED with SOB and diagnosed with CPAP and acute respiratory failure with hypoxia. He was discharged from hospital on Augmentin for 5 days. He completed Rocephin/Zpak in hospital. He was discharged on home O2, pt unsure how many liters he is on.   He has COPD. Continues to have SOB, intermittent cough, and weakness. Home Health was ordered, but has not come out yet per patient. Wife does all his medications and is at work at this time.   He does need a refill on Breo.  Cough This is a new problem. The current episode started 1 to 4 weeks ago. The problem has been waxing and waning. The problem occurs every few minutes. The cough is Productive of sputum. Associated symptoms include nasal congestion, shortness of  breath and wheezing. Pertinent negatives include no chills, ear congestion, ear pain, fever or headaches. He has tried rest and OTC cough suppressant for the symptoms.      Review of Systems  Constitutional:  Negative for chills and fever.  HENT:  Negative for ear pain.   Respiratory:  Positive for cough, shortness of breath and wheezing.   Neurological:  Negative for headaches.     Observations/Objective: Intermittent cough, weakness  Assessment and Plan: 1. COPD with acute exacerbation (HCC) - Budeson-Glycopyrrol-Formoterol (BREZTRI AEROSPHERE) 160-9-4.8 MCG/ACT AERO; Inhale 2 puffs into the lungs 2 (two) times daily.  Dispense: 32.1 g; Refill: 11  2. Acute respiratory failure with hypoxia (HCC)  3. Community acquired pneumonia, unspecified laterality  4. Hospital discharge follow-up  5. Chronic obstructive pulmonary disease, unspecified COPD type [facility])   Hospital notes reviewed Needs follow up in person next week  Continue Augmentin  Continue O2 Home health, if have not heard from them today or tomorrow let us know       I discussed the assessment and treatment plan with the patient. The patient was provided an opportunity to ask questions and all were answered. The patient agreed with the plan and demonstrated an understanding of the instructions.   The patient was advised to call back or seek an in-person evaluation if the symptoms worsen or if the condition fails to improve as anticipated.  The above assessment and management plan was discussed with the patient. The patient verbalized understanding of and has agreed to the management plan. Patient is aware to call  the clinic if symptoms persist or worsen. Patient is aware when to return to the clinic for a follow-up visit. Patient educated on when it is appropriate to go to the emergency department.   Time call ended:  10:45 AM   I provided 25 minutes of  non face-to-face time during this encounter.    Evelina Dun, FNP

## 2022-06-13 NOTE — Patient Outreach (Signed)
  Care Coordination [facility] Note Transition Care Management Follow-up Telephone Call Date of discharge and from where: 06/10/22 Matthew May acute respiratory failure How have you been since you were released from the hospital? I am doing good and my breathing is OK.  I am wearing my oxygen Any questions or concerns? No  Items Reviewed: Did the pt receive and understand the discharge instructions provided? Yes  Medications obtained and verified? Yes  Other? Yes  s/sx HF to call MD Any new allergies since your discharge? No  Dietary orders reviewed? Yes Do you have support at home? Yes   Home Care and Equipment/Supplies: Were home health services ordered? yes If so, what is the name of the agency? [facility]  Has the agency set up a time to come to the patient's home? no Were any new equipment or medical supplies ordered?  Yes: oxygen What is the name of the medical supply agency? [facility] Were you able to get the supplies/equipment? yes Do you have any questions related to the use of the equipment or supplies? No  Functional Questionnaire: (I = Independent and D = Dependent) ADLs: I wife and family assist  Bathing/Dressing- I  Meal Prep- I  Eating- I  Maintaining continence- I  Transferring/Ambulation- I  Managing Meds- I  Follow up appointments reviewed:  PCP Hospital f/u appt confirmed? Yes  Scheduled to see Matthew May on 06/13/22 @ 10:25. [facility] f/u appt confirmed? No  Scheduled to see  on  @ . Are transportation arrangements needed? No  If their condition worsens, is the pt aware to call PCP or go to the Emergency Dept.? Yes Was the patient provided with contact information for the PCP's office or ED? Yes Was to pt encouraged to call back with questions or concerns? Yes  SDOH assessments and interventions completed:   Yes SDOH Interventions Today    Flowsheet Row Most Recent Value  SDOH Interventions   Food Insecurity Interventions Intervention Not Indicated   Transportation Interventions Intervention Not Indicated       Care Coordination Interventions:  Contacted [facility] and confirmed that they have order to see pt and will contact today    Encounter Outcome:  Pt. Visit Completed   Matthew Garter RN, BSN,CCM, CDE Care Management Coordinator [facility] [phone]

## 2022-06-13 NOTE — Patient Instructions (Signed)
Community-Acquired Pneumonia, Adult Pneumonia is a lung infection that causes inflammation and the buildup of mucus and fluids in the lungs. This may cause coughing and difficulty breathing. Community-acquired pneumonia is pneumonia that develops in people who are not, and have not recently been, in a hospital or other health care facility. Usually, pneumonia develops as a result of an illness that is caused by a virus, such as the common cold and the flu (influenza). It can also be caused by bacteria or fungi. While the common cold and influenza can pass from person to person (are contagious), pneumonia itself is not considered contagious. What are the causes? This condition may be caused by: Viruses. Bacteria. Fungi. What increases the risk? The following factors may make you more likely to develop this condition: Being over age 65 or having certain medical conditions, such as: A long-term (chronic) disease, such as: chronic obstructive pulmonary disease (COPD), asthma, heart failure, diabetes, or kidney disease. A condition that increases the risk of breathing in (aspirating) mucus and other fluids from your mouth and nose. A weakened body defense system (immune system). Having had your spleen removed (splenectomy). The spleen is the organ that helps fight germs and infections. Not cleaning your teeth and gums well (poor dental hygiene). Using tobacco products. Traveling to places where germs that cause pneumonia are present or being near certain animals or animal habitats that could have germs that cause pneumonia. What are the signs or symptoms? Symptoms of this condition include: A dry cough or a wet (productive) cough. A fever, sweating, or chills. Chest pain, especially when breathing deeply or coughing. Fast breathing, difficulty breathing, or shortness of breath. Tiredness (fatigue) and muscle aches. How is this diagnosed? This condition may be diagnosed based on your medical  history or a physical exam. You may also have tests, including: Imaging, such as a chest X-ray or lung ultrasound. Tests of: The level of oxygen and other gases in your blood. Mucus from your lungs (sputum). Fluid around your lungs (pleural fluid). Your urine. How is this treated? Treatment for this condition depends on many factors, such as the cause of your pneumonia, your medicines, and other medical conditions that you have. For most adults, pneumonia may be treated at home. In some cases, treatment must happen in a hospital and may include: Medicines that are given by mouth (orally) or through an IV, including: Antibiotic medicines, if bacteria caused the pneumonia. Medicines that kill viruses (antiviral medicines), if a virus caused the pneumonia. Oxygen therapy. Severe pneumonia, although rare, may require the following treatments: Mechanical ventilation.This procedure uses a machine to help you breathe if you cannot breathe well on your own or maintain a safe level of blood oxygen. Thoracentesis. This procedure removes any buildup of pleural fluid to help with breathing. Follow these instructions at home:  Medicines Take over-the-counter and prescription medicines only as told by your health care provider. Take cough medicine only if you have trouble sleeping. Cough medicine can prevent your body from removing mucus from your lungs. If you were prescribed antibiotics, take them as told by your health care provider. Do not stop taking the antibiotic even if you start to feel better. Lifestyle     Do not drink alcohol. Do not use any products that contain nicotine or tobacco. These products include cigarettes, chewing tobacco, and vaping devices, such as e-cigarettes. If you need help quitting, ask your health care provider. Eat a healthy diet. This includes plenty of vegetables, fruits, whole grains, low-fat   dairy products, and lean protein. General instructions Rest a lot and  get at least 8 hours of sleep each night. Sleep in a partly upright position at night. Place a few pillows under your head or sleep in a reclining chair. Return to your normal activities as told by your health care provider. Ask your health care provider what activities are safe for you. Drink enough fluid to keep your urine pale yellow. This helps to thin the mucus in your lungs. If your throat is sore, gargle with a mixture of salt and water 3-4 times a day or as needed. To make salt water, completely dissolve -1 tsp (3-6 g) of salt in 1 cup (237 mL) of warm water. Keep all follow-up visits. How is this prevented? You can lower your risk of developing community-acquired pneumonia by: Getting the pneumonia vaccine. There are different types and schedules of pneumonia vaccines. Ask your health care provider which option is best for you. Consider getting the pneumonia vaccine if: You are older than 75 years of age. You are 19-65 years of age and are receiving cancer treatment, have chronic lung disease, or have other medical conditions that affect your immune system. Ask your health care provider if this applies to you. Getting your influenza vaccine every year. Ask your health care provider which type of vaccine is best for you. Getting regular dental checkups. Washing your hands often with soap and water for at least 20 seconds. If soap and water are not available, use hand sanitizer. Contact a health care provider if: You have a fever. You have trouble sleeping because you cannot control your cough with cough medicine. Get help right away if: Your shortness of breath becomes worse. Your chest pain increases. Your sickness becomes worse, especially if you are an older adult or have a weak immune system. You cough up blood. These symptoms may be an emergency. Get help right away. Call 911. Do not wait to see if the symptoms will go away. Do not drive yourself to the  hospital. Summary Pneumonia is an infection of the lungs. Community-acquired pneumonia develops in people who have not been in the hospital. It can be caused by bacteria, viruses, or fungi. This condition may be treated with antibiotics or antiviral medicines. Severe pneumonia may require a hospital stay and treatment to help with breathing. This information is not intended to replace advice given to you by your health care provider. Make sure you discuss any questions you have with your health care provider. Document Revised: 07/20/2021 Document Reviewed: 07/20/2021 Elsevier Patient Education  2023 [employer]  

## 2022-06-16 ENCOUNTER — Ambulatory Visit (INDEPENDENT_AMBULATORY_CARE_PROVIDER_SITE_OTHER): Payer: Medicare Other | Admitting: Family

## 2022-06-16 ENCOUNTER — Encounter: Payer: Self-pay | Admitting: Family

## 2022-06-16 VITALS — BP 100/65 | HR 99 | Temp 97.8°F | Ht 66.5 in | Wt 243.0 lb

## 2022-06-16 DIAGNOSIS — R531 Weakness: Secondary | ICD-10-CM | POA: Diagnosis not present

## 2022-06-16 DIAGNOSIS — Z09 Encounter for follow-up examination after completed treatment for conditions other than malignant neoplasm: Secondary | ICD-10-CM

## 2022-06-16 DIAGNOSIS — I959 Hypotension, unspecified: Secondary | ICD-10-CM

## 2022-06-16 DIAGNOSIS — J9601 Acute respiratory failure with hypoxia: Secondary | ICD-10-CM

## 2022-06-16 DIAGNOSIS — J410 Simple chronic bronchitis: Secondary | ICD-10-CM

## 2022-06-16 NOTE — Patient Instructions (Signed)
Chronic Obstructive Pulmonary Disease  Chronic obstructive pulmonary disease (COPD) is a long-term (chronic) condition that affects the lungs. COPD is a general term that can be used to describe many different lung problems that cause lung inflammation and limit airflow, including chronic bronchitis and emphysema. If you have COPD, your lung function will probably never return to normal. In most cases, it gets worse over time. However, there are steps you can take to slow the progression of the disease and improve your quality of life. What are the causes? This condition may be caused by: Smoking. This is the most common cause. Certain genes passed down through families. What increases the risk? The following factors may make you more likely to develop this condition: Being exposed to secondhand smoke from cigarettes, pipes, or cigars. Being exposed to chemicals and other irritants, such as fumes and dust in the work environment. Having chronic lung conditions or infections. What are the signs or symptoms? Symptoms of this condition include: Shortness of breath, especially during physical activity. Chronic cough with a large amount of thick mucus. Sometimes, the cough may not have any mucus (dry cough). Wheezing and rapid breathing. Gray or bluish discoloration (cyanosis) of the skin, especially in the fingers, toes, or lips. Feeling tired (fatigue). Weight loss. Chest tightness. Frequent infections. Episodes when breathing symptoms become much worse (exacerbations). At the later stages of this disease, you may have swelling in the ankles, feet, or legs. How is this diagnosed? This condition is diagnosed based on: Your medical history. A physical exam. You may also have tests, including: Lung (pulmonary) function tests. This may include a spirometry test, which measures your ability to exhale properly. Chest X-ray. CT scan. Blood tests. How is this treated? This condition may be  treated with: Medicines. These may include inhaled rescue medicines to treat acute exacerbations as well as medicines that you take long-term (maintenance medicines) to prevent flare-ups of COPD. Bronchodilators help treat COPD by dilating the airways to allow increased airflow and make your breathing more comfortable. Steroids can reduce airway inflammation and help prevent exacerbations. Smoking cessation. If you smoke, your health care provider may ask you to quit, and may also recommend therapy or replacement products to help you quit. Pulmonary rehabilitation. This may involve working with a team of health care providers and specialists, such as respiratory, occupational, and physical therapists. Exercise and physical activity. These are beneficial for nearly all people with COPD. Nutrition therapy to gain weight, if you are underweight. Oxygen. Supplemental oxygen therapy is only helpful if you have a low oxygen level in your blood (hypoxemia). Lung surgery or transplant. Palliative care. This is to help people with COPD feel comfortable when treatment is no longer working. Follow these instructions at home: Medicines Take over-the-counter and prescription medicines only as told by your health care provider. This includes inhaled medicines and pills. Talk to your health care provider before taking any cough or allergy medicines. You may need to avoid certain medicines that dry out your airways. Lifestyle If you smoke, the most important thing that you can do is to stop smoking. Continuing to smoke will cause the disease to progress faster. Do not use any products that contain nicotine or tobacco. These products include cigarettes, chewing tobacco, and vaping devices, such as e-cigarettes. If you need help quitting, ask your health care provider. Avoid exposure to things that irritate your lungs, such as smoke, chemicals, and fumes. Stay active, but balance activity with periods of rest.  Exercise and   physical activity will help you maintain your ability to do things you want to do. Learn and use relaxation techniques to manage stress and to control your breathing. Get the right amount of sleep and get quality sleep. Most adults need 7 or more hours per night. Eat healthy foods. Eating smaller, more frequent meals and resting before meals may help you maintain your strength. Controlled breathing Learn and use controlled breathing techniques as directed by your health care provider. Controlled breathing techniques include: Pursed lip breathing. Start by breathing in (inhaling) through your nose for 1 second. Then, purse your lips as if you were going to whistle and breathe out (exhale) through the pursed lips for 2 seconds. Diaphragmatic breathing. Start by putting one hand on your abdomen just above your waist. Inhale slowly through your nose. The hand on your abdomen should move out. Then purse your lips and exhale slowly. You should be able to feel the hand on your abdomen moving in as you exhale.  Controlled coughing Learn and use controlled coughing to clear mucus from your lungs. Controlled coughing is a series of short, progressive coughs. The steps of controlled coughing are: Lean your head slightly forward. Breathe in deeply using diaphragmatic breathing. Try to hold your breath for 3 seconds. Keep your mouth slightly open while coughing twice. Spit any mucus out into a tissue. Rest and repeat the steps once or twice as needed. General instructions Make sure you receive all the vaccines that your health care provider recommends, especially the pneumococcal and influenza vaccines. Preventing infection and hospitalization is very important when you have COPD. Drink enough fluid to keep your urine pale yellow, unless you have a medical condition that requires fluid restriction. Use oxygen therapy and pulmonary rehabilitation if told by your health care provider. If you  require home oxygen therapy, ask your health care provider whether you should purchase a pulse oximeter to measure your oxygen level at home. Work with your health care provider to develop a COPD action plan. This will help you know what steps to take if your condition gets worse. Keep other chronic health conditions under control as told by your health care provider. Avoid extreme temperature and humidity changes. Avoid contact with people who have an illness that spreads from person to person (is contagious), such as viral infections or pneumonia. Keep all follow-up visits. This is important. Contact a health care provider if: You are coughing up more mucus than usual. There is a change in the color or thickness of your mucus. Your breathing is more labored than usual. Your breathing is faster than usual. You have difficulty sleeping. You need to use your rescue medicines or inhalers more often than expected. You have trouble doing routine activities such as getting dressed or walking around the house. Get help right away if: You have shortness of breath while you are resting. You have shortness of breath that prevents you from: Being able to talk. Performing your usual physical activities. You have chest pain lasting longer than 5 minutes. Your skin color is more blue (cyanotic) than usual. You measure low oxygen saturations for longer than 5 minutes with a pulse oximeter. You have a fever. You feel too tired to breathe normally. These symptoms may represent a serious problem that is an emergency. Do not wait to see if the symptoms will go away. Get medical help right away. Call your local emergency services (911 in the [place] Do not drive yourself to the hospital. Summary Chronic obstructive   pulmonary disease (COPD) is a long-term (chronic) condition that affects the lungs. Your lung function will probably never return to normal. In most cases, it gets worse over time. However, there  are steps you can take to slow the progression of the disease and improve your quality of life. Treatment for COPD may include taking medicines, quitting smoking, pulmonary rehabilitation, and changes to diet and exercise. As the disease progresses, you may need oxygen therapy, a lung transplant, or palliative care. To help manage your condition, do not smoke, avoid exposure to things that irritate your lungs, stay up to date on all vaccines, and follow your health care provider's instructions for taking medicines. This information is not intended to replace advice given to you by your health care provider. Make sure you discuss any questions you have with your health care provider. Document Revised: 03/30/2020 Document Reviewed: 03/30/2020 Elsevier Patient Education  2023 [employer] Inc[employer]  

## 2022-06-16 NOTE — Progress Notes (Signed)
%  Subjective:    Patient ID: Matthew May, male    DOB: 02-Mar-1946, 77 y.o.   MRN: [id number]  Chief Complaint  Patient presents with   Transitions Of Care    HPI Pt presents to the office today for hospital follow up. He went to the ED with SOB and diagnosed with CPAP and acute respiratory failure with hypoxia. He was discharged from hospital on Augmentin for 5 days. He completed Rocephin/Zpak in hospital. He was discharged on home O2, but using as needed. Wife report while he is sitting his O2 is 95%   He has COPD. Continues to have SOB, and weakness. Home Health was ordered, but has not come out yet per patient. Wife states they do not want Home Health at this time.   He is taking his Breztri BID.   He is hypotensive today.    Review of Systems  All other systems reviewed and are negative.      Objective:   Physical Exam Vitals reviewed.  Constitutional:      General: He is not in acute distress.    Appearance: He is well-developed.  HENT:     Head: Normocephalic.  Eyes:     General:        Right eye: No discharge.        Left eye: No discharge.     Pupils: Pupils are equal, round, and reactive to light.  Neck:     Thyroid: No thyromegaly.  Cardiovascular:     Rate and Rhythm: Normal rate and regular rhythm.     Heart sounds: Normal heart sounds. No murmur heard. Pulmonary:     Effort: Pulmonary effort is normal. No respiratory distress.     Breath sounds: Wheezing and rhonchi present.  Abdominal:     General: Bowel sounds are normal. There is no distension.     Palpations: Abdomen is soft.     Tenderness: There is no abdominal tenderness.  Musculoskeletal:        General: No tenderness. Normal range of motion.     Cervical back: Normal range of motion and neck supple.  Skin:    General: Skin is warm and dry.     Findings: No erythema or rash.  Neurological:     Mental Status: He is alert and oriented to person, place, and time.     Cranial Nerves: No  cranial nerve deficit.     Motor: Weakness present.     Gait: Gait abnormal (using rolling walker).     Deep Tendon Reflexes: Reflexes are normal and symmetric.  Psychiatric:        Behavior: Behavior normal.        Thought Content: Thought content normal.        Judgment: Judgment normal.       BP (!) 88/65   Pulse 99   Temp 97.8 F (36.6 C) (Temporal)   Ht 5' 6.5" (1.689 m)   Wt 243 lb (110.2 kg)   SpO2 90%   BMI 38.63 kg/m      Assessment & Plan:  Matthew May comes in today with chief complaint of Transitions Of Care   Diagnosis and orders addressed:  1. Acute respiratory failure with hypoxia (HCC) - CMP14+EGFR - CBC with Differential/Platelet  2. Simple chronic bronchitis (HCC) - CMP14+EGFR - CBC with Differential/Platelet  3. Hypotension, unspecified hypotension type - CMP14+EGFR - CBC with Differential/Platelet  4. Weakness - CMP14+EGFR - CBC with Differential/Platelet  5. Hospital  discharge follow-up - CMP14+EGFR - CBC with Differential/Platelet   Labs pending Hold valsartan 40 mg until >130/80.  Health Maintenance reviewed Diet and exercise encouraged  Follow up plan: 1 week    Evelina Dun, FNP

## 2022-06-17 LAB — CBC WITH DIFFERENTIAL/PLATELET
Basophils Absolute: 0 10*3/uL (ref 0.0–0.2)
Basos: 0 %
EOS (ABSOLUTE): 0.3 10*3/uL (ref 0.0–0.4)
Eos: 3 %
Hematocrit: 40.4 % (ref 37.5–51.0)
Hemoglobin: 12.9 g/dL — ABNORMAL LOW (ref 13.0–17.7)
Immature Grans (Abs): 0 10*3/uL (ref 0.0–0.1)
Immature Granulocytes: 0 %
Lymphocytes Absolute: 1.6 10*3/uL (ref 0.7–3.1)
Lymphs: 18 %
MCH: 29.3 pg (ref 26.6–33.0)
MCHC: 31.9 g/dL (ref 31.5–35.7)
MCV: 92 fL (ref 79–97)
Monocytes Absolute: 0.5 10*3/uL (ref 0.1–0.9)
Monocytes: 5 %
Neutrophils Absolute: 6.7 10*3/uL (ref 1.4–7.0)
Neutrophils: 74 %
Platelets: 153 10*3/uL (ref 150–450)
RBC: 4.4 x10E6/uL (ref 4.14–5.80)
RDW: 12 % (ref 11.6–15.4)
WBC: 9.1 10*3/uL (ref 3.4–10.8)

## 2022-06-17 LAB — CMP14+EGFR
ALT: 29 IU/L (ref 0–44)
AST: 23 IU/L (ref 0–40)
Albumin/Globulin Ratio: 1.3 (ref 1.2–2.2)
Albumin: 4 g/dL (ref 3.8–4.8)
Alkaline Phosphatase: 88 IU/L (ref 44–121)
BUN/Creatinine Ratio: 20 (ref 10–24)
BUN: 19 mg/dL (ref 8–27)
Bilirubin Total: 0.3 mg/dL (ref 0.0–1.2)
CO2: 27 mmol/L (ref 20–29)
Calcium: 8.9 mg/dL (ref 8.6–10.2)
Chloride: 96 mmol/L (ref 96–106)
Creatinine, Ser: 0.95 mg/dL (ref 0.76–1.27)
Globulin, Total: 3 g/dL (ref 1.5–4.5)
Glucose: 149 mg/dL — ABNORMAL HIGH (ref 70–99)
Potassium: 4.1 mmol/L (ref 3.5–5.2)
Sodium: 141 mmol/L (ref 134–144)
Total Protein: 7 g/dL (ref 6.0–8.5)
eGFR: 84 mL/min/{1.73_m2} (ref >59)

## 2022-06-23 ENCOUNTER — Ambulatory Visit (INDEPENDENT_AMBULATORY_CARE_PROVIDER_SITE_OTHER): Payer: Medicare Other

## 2022-06-23 ENCOUNTER — Encounter: Payer: Self-pay | Admitting: Family

## 2022-06-23 ENCOUNTER — Ambulatory Visit (INDEPENDENT_AMBULATORY_CARE_PROVIDER_SITE_OTHER): Payer: Medicare Other | Admitting: Family

## 2022-06-23 VITALS — BP 128/74 | HR 77 | Temp 97.8°F | Ht 66.5 in | Wt 244.8 lb

## 2022-06-23 DIAGNOSIS — E1169 Type 2 diabetes mellitus with other specified complication: Secondary | ICD-10-CM

## 2022-06-23 DIAGNOSIS — J441 Chronic obstructive pulmonary disease with (acute) exacerbation: Secondary | ICD-10-CM

## 2022-06-23 DIAGNOSIS — J189 Pneumonia, unspecified organism: Secondary | ICD-10-CM | POA: Diagnosis not present

## 2022-06-23 DIAGNOSIS — E785 Hyperlipidemia, unspecified: Secondary | ICD-10-CM

## 2022-06-23 DIAGNOSIS — I959 Hypotension, unspecified: Secondary | ICD-10-CM

## 2022-06-23 DIAGNOSIS — I110 Hypertensive heart disease with heart failure: Secondary | ICD-10-CM | POA: Diagnosis not present

## 2022-06-23 DIAGNOSIS — I10 Essential (primary) hypertension: Secondary | ICD-10-CM

## 2022-06-23 DIAGNOSIS — I5033 Acute on chronic diastolic (congestive) heart failure: Secondary | ICD-10-CM | POA: Diagnosis not present

## 2022-06-23 DIAGNOSIS — R079 Chest pain, unspecified: Secondary | ICD-10-CM | POA: Diagnosis not present

## 2022-06-23 NOTE — Patient Instructions (Signed)
Peripheral Edema  Peripheral edema is swelling that is caused by a buildup of fluid. Peripheral edema most often affects the lower legs, ankles, and feet. It can also develop in the arms, hands, and face. The area of the body that has peripheral edema will look swollen. It may also feel heavy or warm. Your clothes may start to feel tight. Pressing on the area may make a temporary dent in your skin (pitting edema). You may not be able to move your swollen arm or leg as much as usual. There are many causes of peripheral edema. It can happen because of a complication of other conditions such as heart failure, kidney disease, or a problem with your circulation. It also can be a side effect of certain medicines or happen because of an infection. It often happens to women during pregnancy. Sometimes, the cause is not known. Follow these instructions at home: Managing pain, stiffness, and swelling  Raise (elevate) your legs while you are sitting or lying down. Move around often to prevent stiffness and to reduce swelling. Do not sit or stand for long periods of time. Do not wear tight clothing. Do not wear garters on your upper legs. Exercise your legs to get your circulation going. This helps to move the fluid back into your blood vessels, and it may help the swelling go down. Wear compression stockings as told by your health care provider. These stockings help to prevent blood clots and reduce swelling in your legs. It is important that these are the correct size. These stockings should be prescribed by your doctor to prevent possible injuries. If elastic bandages or wraps are recommended, use them as told by your health care provider. Medicines Take over-the-counter and prescription medicines only as told by your health care provider. Your health care provider may prescribe medicine to help your body get rid of excess water (diuretic). Take this medicine if you are told to take it. General  instructions Eat a low-salt (low-sodium) diet as told by your health care provider. Sometimes, eating less salt may reduce swelling. Pay attention to any changes in your symptoms. Moisturize your skin daily to help prevent skin from cracking and draining. Keep all follow-up visits. This is important. Contact a health care provider if: You have a fever. You have swelling in only one leg. You have increased swelling, redness, or pain in one or both of your legs. You have drainage or sores at the area where you have edema. Get help right away if: You have edema that starts suddenly or is getting worse, especially if you are pregnant or have a medical condition. You develop shortness of breath, especially when you are lying down. You have pain in your chest or abdomen. You feel weak. You feel like you will faint. These symptoms may be an emergency. Get help right away. Call 911. Do not wait to see if the symptoms will go away. Do not drive yourself to the hospital. Summary Peripheral edema is swelling that is caused by a buildup of fluid. Peripheral edema most often affects the lower legs, ankles, and feet. Move around often to prevent stiffness and to reduce swelling. Do not sit or stand for long periods of time. Pay attention to any changes in your symptoms. Contact a health care provider if you have edema that starts suddenly or is getting worse, especially if you are pregnant or have a medical condition. Get help right away if you develop shortness of breath, especially when lying down.   This information is not intended to replace advice given to you by your health care provider. Make sure you discuss any questions you have with your health care provider. Document Revised: 01/24/2021 Document Reviewed: 01/24/2021 Elsevier Patient Education  2023 Elsevier Inc[employer]  

## 2022-06-23 NOTE — Progress Notes (Signed)
Subjective:    Patient ID: Matthew May, male    DOB: 1945/10/17, 77 y.o.   MRN: [id number]  Chief Complaint  Patient presents with   Follow-up   Edema   Pt presents to the office today to recheck edema and hypotension. He was seen last week for hospital follow up on pneumonia with acute respiratory failure with hypoxia. His BP was 88/65 and was told to hold his valsartan. His BP today is 128/74.   His O2 at home has been 96%. Has not needed home oxygen.   He has COPD and using Breztri BID and not having to use albuterol since discharge from the hospital.   He has continued his Demadex to 40 mg.   Hypertension This is a chronic problem. The current episode started more than 1 year ago. The problem has been resolved since onset. The problem is controlled. Associated symptoms include malaise/fatigue, peripheral edema and shortness of breath. Risk factors for coronary artery disease include dyslipidemia, diabetes mellitus, obesity, sedentary lifestyle and male gender.       Review of Systems  Constitutional:  Positive for malaise/fatigue.  Respiratory:  Positive for shortness of breath.   All other systems reviewed and are negative.      Objective:   Physical Exam Vitals reviewed.  Constitutional:      General: He is not in acute distress.    Appearance: He is well-developed. He is obese.  HENT:     Head: Normocephalic.  Eyes:     General:        Right eye: No discharge.        Left eye: No discharge.     Pupils: Pupils are equal, round, and reactive to light.  Neck:     Thyroid: No thyromegaly.  Cardiovascular:     Rate and Rhythm: Normal rate and regular rhythm.     Heart sounds: Normal heart sounds. No murmur heard. Pulmonary:     Effort: Pulmonary effort is normal. No respiratory distress.     Breath sounds: Wheezing and rhonchi present.  Abdominal:     General: Bowel sounds are normal. There is no distension.     Palpations: Abdomen is soft.      Tenderness: There is no abdominal tenderness.  Musculoskeletal:        General: No tenderness or signs of injury. Normal range of motion.     Cervical back: Normal range of motion and neck supple.     Right lower leg: Edema (3+) present.     Left lower leg: Edema (3+) present.  Skin:    General: Skin is warm and dry.     Findings: No erythema or rash.  Neurological:     Mental Status: He is alert and oriented to person, place, and time.     Cranial Nerves: No cranial nerve deficit.     Deep Tendon Reflexes: Reflexes are normal and symmetric.  Psychiatric:        Behavior: Behavior normal.        Thought Content: Thought content normal.        Judgment: Judgment normal.      BP 128/74   Pulse 77   Temp 97.8 F (36.6 C) (Temporal)   Ht 5' 6.5" (1.689 m)   Wt 244 lb 12.8 oz (111 kg)   SpO2 98%   BMI 38.92 kg/m      Assessment & Plan:  Matthew May comes in today with chief complaint of Follow-up  and Edema   Diagnosis and orders addressed:  1. Primary hypertension At goal   2. Acute on chronic diastolic CHF (congestive heart failure) ([place])  3. COPD with acute exacerbation ([place]) - DG Chest 2 View  4. Hyperlipidemia associated with type 2 diabetes mellitus ([place])  5. Hypotension, unspecified hypotension type Resolved since hold BP medications   6. Community acquired pneumonia, unspecified laterality Improved Continue O2 as needed  - DG Chest 2 View   Labs pending Health Maintenance reviewed Diet and exercise encouraged  Follow up plan: 1 months    Evelina Dun, FNP

## 2022-07-24 ENCOUNTER — Ambulatory Visit (INDEPENDENT_AMBULATORY_CARE_PROVIDER_SITE_OTHER): Payer: Medicare Other | Admitting: Family

## 2022-07-24 ENCOUNTER — Encounter: Payer: Self-pay | Admitting: Family

## 2022-07-24 VITALS — BP 115/82 | HR 81 | Temp 97.8°F | Ht 66.5 in | Wt 243.0 lb

## 2022-07-24 DIAGNOSIS — E559 Vitamin D deficiency, unspecified: Secondary | ICD-10-CM

## 2022-07-24 DIAGNOSIS — I480 Paroxysmal atrial fibrillation: Secondary | ICD-10-CM

## 2022-07-24 DIAGNOSIS — E038 Other specified hypothyroidism: Secondary | ICD-10-CM | POA: Diagnosis not present

## 2022-07-24 DIAGNOSIS — E1169 Type 2 diabetes mellitus with other specified complication: Secondary | ICD-10-CM | POA: Diagnosis not present

## 2022-07-24 DIAGNOSIS — J441 Chronic obstructive pulmonary disease with (acute) exacerbation: Secondary | ICD-10-CM

## 2022-07-24 DIAGNOSIS — Z23 Encounter for immunization: Secondary | ICD-10-CM | POA: Diagnosis not present

## 2022-07-24 DIAGNOSIS — I5033 Acute on chronic diastolic (congestive) heart failure: Secondary | ICD-10-CM | POA: Diagnosis not present

## 2022-07-24 DIAGNOSIS — I2510 Atherosclerotic heart disease of native coronary artery without angina pectoris: Secondary | ICD-10-CM | POA: Diagnosis not present

## 2022-07-24 DIAGNOSIS — Z0000 Encounter for general adult medical examination without abnormal findings: Secondary | ICD-10-CM | POA: Diagnosis not present

## 2022-07-24 DIAGNOSIS — Z0001 Encounter for general adult medical examination with abnormal findings: Secondary | ICD-10-CM

## 2022-07-24 DIAGNOSIS — E785 Hyperlipidemia, unspecified: Secondary | ICD-10-CM | POA: Diagnosis not present

## 2022-07-24 DIAGNOSIS — I10 Essential (primary) hypertension: Secondary | ICD-10-CM

## 2022-07-24 DIAGNOSIS — Z6838 Body mass index (BMI) 38.0-38.9, adult: Secondary | ICD-10-CM

## 2022-07-24 DIAGNOSIS — J9601 Acute respiratory failure with hypoxia: Secondary | ICD-10-CM

## 2022-07-24 NOTE — Progress Notes (Addendum)
Subjective:    Patient ID: Matthew May, male    DOB: April 05, 1946, 77 y.o.   MRN: [id number]  Chief Complaint  Patient presents with   Pneumonia   Pt calls the office today CPE and  chronic follow up. Pt is followed by Cardiologists CAD, aortic stenosis, and A Fib. 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    He is moribid obese with a BMI of 38 and co moribitiy of DM and HTN.    He has COPD and states this is stable. Quit smoking in 2011.  Hypertension This is a chronic problem. The current episode started more than 1 year ago. The problem has been resolved since onset. The problem is controlled. Associated symptoms include blurred vision, malaise/fatigue, peripheral edema and shortness of breath. Risk factors for coronary artery disease include dyslipidemia, diabetes mellitus, obesity, male gender and sedentary lifestyle. The current treatment provides moderate improvement. Hypertensive end-organ damage includes CAD/MI and heart failure. Identifiable causes of hypertension include a thyroid problem.  Congestive Heart Failure Presents for follow-up visit. Associated symptoms include edema, fatigue and shortness of breath. The symptoms have been stable.  Thyroid Problem Presents for follow-up visit. Symptoms include fatigue and hoarse voice. Patient reports no constipation, depressed mood or diarrhea. The symptoms have been stable. His past medical history is significant for heart failure and hyperlipidemia.  Hyperlipidemia This is a chronic problem. The current episode started more than 1 year ago. The problem is controlled. Exacerbating diseases include obesity. Associated symptoms include shortness of breath. Current antihyperlipidemic treatment includes statins. The current treatment provides moderate improvement of lipids. Risk factors for coronary artery disease include dyslipidemia, diabetes mellitus, hypertension, male sex and a sedentary lifestyle.  Diabetes He  presents for his follow-up diabetic visit. He has type 2 diabetes mellitus. Associated symptoms include blurred vision, fatigue and foot paresthesias. There are no hypoglycemic complications. Symptoms are stable. Diabetic complications include heart disease and peripheral neuropathy. Risk factors for coronary artery disease include dyslipidemia, diabetes mellitus, hypertension, male sex, sedentary lifestyle and post-menopausal. He is following a generally unhealthy diet. His overall blood glucose range is 140-180 mg/dl.      Review of Systems  Constitutional:  Positive for fatigue and malaise/fatigue.  HENT:  Positive for hoarse voice.   Eyes:  Positive for blurred vision.  Respiratory:  Positive for shortness of breath.   Gastrointestinal:  Negative for constipation and diarrhea.  All other systems reviewed and are negative.   Family History  Problem Relation Age of Onset   Heart disease Father    Heart attack Father    Mental illness Mother    Mental illness Sister    Diabetes Brother    Depression Maternal Aunt    Social History   Socioeconomic History   Marital status: Married    Spouse name: Lucita Ferrara   Number of children: 3   Years of education: 8   Highest education level: 8th grade  Occupational History   Occupation: Retired    Comment: scrap yard  Tobacco Use   Smoking status: Former    Types: Cigarettes    Quit date: 07/11/2009    Years since quitting: 13.0   Smokeless tobacco: Never   Tobacco comments:    smoked about 10 cig/day; used to smoke 2 ppd for many years   Vaping Use   Vaping Use: Never used  Substance and Sexual Activity   Alcohol use: No    Alcohol/week: 0.0 standard drinks of alcohol  Drug use: No   Sexual activity: Never  Other Topics Concern   Not on file  Social History Narrative   Lives at home with his wife. He is retired but his wife continues to work daily. They have adult children that live locally and grandchildren that they spend time  with regularly. He is active around his home and yard.    Social Determinants of Health   Financial Resource Strain: Low Risk  (08/04/2021)   Overall Financial Resource Strain (CARDIA)    Difficulty of Paying Living Expenses: Not hard at all  Food Insecurity: No Food Insecurity (06/13/2022)   Hunger Vital Sign    Worried About Running Out of Food in the Last Year: Never true    Ran Out of Food in the Last Year: Never true  Transportation Needs: No Transportation Needs (06/13/2022)   PRAPARE - Hydrologist (Medical): No    Lack of Transportation (Non-Medical): No  Physical Activity: Sufficiently Active (08/04/2021)   Exercise Vital Sign    Days of Exercise per Week: 5 days    Minutes of Exercise per Session: 30 min  Stress: No Stress Concern Present (08/04/2021)   [place]    Feeling of Stress : Not at all  Social Connections: Moderately Isolated (08/04/2021)   Social Connection and Isolation Panel [NHANES]    Frequency of Communication with Friends and Family: More than three times a week    Frequency of Social Gatherings with Friends and Family: More than three times a week    Attends Religious Services: Never    Marine scientist or Organizations: No    Attends Archivist Meetings: Never    Marital Status: Married       Objective:   Physical Exam Vitals reviewed.  Constitutional:      General: He is not in acute distress.    Appearance: He is well-developed. He is obese.  HENT:     Head: Normocephalic.     Right Ear: Tympanic membrane normal.     Left Ear: Tympanic membrane normal.  Eyes:     General:        Right eye: No discharge.        Left eye: No discharge.     Pupils: Pupils are equal, round, and reactive to light.  Neck:     Thyroid: No thyromegaly.  Cardiovascular:     Rate and Rhythm: Normal rate and regular rhythm.     Heart sounds: Normal heart sounds.  No murmur heard. Pulmonary:     Effort: Pulmonary effort is normal. No respiratory distress.     Breath sounds: Wheezing and rhonchi present.  Abdominal:     General: Bowel sounds are normal. There is no distension.     Palpations: Abdomen is soft.     Tenderness: There is no abdominal tenderness.  Musculoskeletal:        General: No tenderness. Normal range of motion.     Cervical back: Normal range of motion and neck supple.     Right lower leg: Edema (2+) present.     Left lower leg: Edema (2+) present.  Skin:    General: Skin is warm and dry.     Findings: No erythema or rash.  Neurological:     Mental Status: He is alert and oriented to person, place, and time.     Cranial Nerves: No cranial nerve deficit.  Deep Tendon Reflexes: Reflexes are normal and symmetric.  Psychiatric:        Behavior: Behavior normal.        Thought Content: Thought content normal.        Judgment: Judgment normal.       BP 115/82   Pulse 81   Temp 97.8 F (36.6 C) (Temporal)   Ht 5' 6.5" (1.689 m)   Wt 243 lb (110.2 kg)   SpO2 95%   BMI 38.63 kg/m      Assessment & Plan:   Elijio Landsberger [place] comes in today with chief complaint of Pneumonia   Diagnosis and orders addressed:  1. Need for immunization against influenza - Flu Vaccine QUAD High Dose(Fluad) - CMP14+EGFR - CBC with Differential/Platelet  2. Annual physical exam - CMP14+EGFR - CBC with Differential/Platelet - Lipid panel - TSH - VITAMIN D 25 Hydroxy (Vit-D Deficiency, Fractures)  3. Acute on chronic diastolic CHF (congestive heart failure) (HCC) - CMP14+EGFR - CBC with Differential/Platelet  4. Acute respiratory failure with hypoxia (HCC) - CMP14+EGFR - CBC with Differential/Platelet  5. COPD with acute exacerbation (HCC) - CMP14+EGFR - CBC with Differential/Platelet  6. Atherosclerosis of native coronary artery of native heart without angina pectoris  - CMP14+EGFR - CBC with  Differential/Platelet  7. Type 2 diabetes mellitus with other specified complication, without long-term current use of insulin (HCC) - CMP14+EGFR - CBC with Differential/Platelet  8. Primary hypertension - CMP14+EGFR - CBC with Differential/Platelet  9. Hyperlipidemia associated with type 2 diabetes mellitus (HCC) - CMP14+EGFR - CBC with Differential/Platelet - Lipid panel  10. Other specified hypothyroidism - CMP14+EGFR - CBC with Differential/Platelet  11. Morbid obesity ([place])  - CMP14+EGFR - CBC with Differential/Platelet  12. Paroxysmal atrial fibrillation (HCC) - CMP14+EGFR - CBC with Differential/Platelet  13. Vitamin D deficiency - CMP14+EGFR - CBC with Differential/Platelet - VITAMIN D 25 Hydroxy (Vit-D Deficiency, Fractures)   Labs pending Health Maintenance reviewed Diet and exercise encouraged  Follow up plan: 3 months    Evelina Dun, FNP

## 2022-07-24 NOTE — Addendum Note (Signed)
Addended by: Ladean Raya on: 07/24/2022 09:44 AM   Modules accepted: Orders

## 2022-07-24 NOTE — Patient Instructions (Signed)
Health Maintenance After Age 75 After age 75, you are at a higher risk for certain long-term diseases and infections as well as injuries from falls. Falls are a major cause of broken bones and head injuries in people who are older than age 75. Getting regular preventive care can help to keep you healthy and well. Preventive care includes getting regular testing and making lifestyle changes as recommended by your health care provider. Talk with your health care provider about: Which screenings and tests you should have. A screening is a test that checks for a disease when you have no symptoms. A diet and exercise plan that is right for you. What should I know about screenings and tests to prevent falls? Screening and testing are the best ways to find a health problem early. Early diagnosis and treatment give you the best chance of managing medical conditions that are common after age 75. Certain conditions and lifestyle choices may make you more likely to have a fall. Your health care provider may recommend: Regular vision checks. Poor vision and conditions such as cataracts can make you more likely to have a fall. If you wear glasses, make sure to get your prescription updated if your vision changes. Medicine review. Work with your health care provider to regularly review all of the medicines you are taking, including over-the-counter medicines. Ask your health care provider about any side effects that may make you more likely to have a fall. Tell your health care provider if any medicines that you take make you feel dizzy or sleepy. Strength and balance checks. Your health care provider may recommend certain tests to check your strength and balance while standing, walking, or changing positions. Foot health exam. Foot pain and numbness, as well as not wearing proper footwear, can make you more likely to have a fall. Screenings, including: Osteoporosis screening. Osteoporosis is a condition that causes  the bones to get weaker and break more easily. Blood pressure screening. Blood pressure changes and medicines to control blood pressure can make you feel dizzy. Depression screening. You may be more likely to have a fall if you have a fear of falling, feel depressed, or feel unable to do activities that you used to do. Alcohol use screening. Using too much alcohol can affect your balance and may make you more likely to have a fall. Follow these instructions at home: Lifestyle Do not drink alcohol if: Your health care provider tells you not to drink. If you drink alcohol: Limit how much you have to: 0-1 drink a day for women. 0-2 drinks a day for men. Know how much alcohol is in your drink. In the [place] one drink equals one 12 oz bottle of beer (355 mL), one 5 oz glass of wine (148 mL), or one 1 oz glass of hard liquor (44 mL). Do not use any products that contain nicotine or tobacco. These products include cigarettes, chewing tobacco, and vaping devices, such as e-cigarettes. If you need help quitting, ask your health care provider. Activity  Follow a regular exercise program to stay fit. This will help you maintain your balance. Ask your health care provider what types of exercise are appropriate for you. If you need a cane or walker, use it as recommended by your health care provider. Wear supportive shoes that have nonskid soles. Safety  Remove any tripping hazards, such as rugs, cords, and clutter. Install safety equipment such as grab bars in bathrooms and safety rails on stairs. Keep rooms and walkways   well-lit. General instructions Talk with your health care provider about your risks for falling. Tell your health care provider if: You fall. Be sure to tell your health care provider about all falls, even ones that seem minor. You feel dizzy, tiredness (fatigue), or off-balance. Take over-the-counter and prescription medicines only as told by your health care provider. These include  supplements. Eat a healthy diet and maintain a healthy weight. A healthy diet includes low-fat dairy products, low-fat (lean) meats, and fiber from whole grains, beans, and lots of fruits and vegetables. Stay current with your vaccines. Schedule regular health, dental, and eye exams. Summary Having a healthy lifestyle and getting preventive care can help to protect your health and wellness after age 75. Screening and testing are the best way to find a health problem early and help you avoid having a fall. Early diagnosis and treatment give you the best chance for managing medical conditions that are more common for people who are older than age 75. Falls are a major cause of broken bones and head injuries in people who are older than age 75. Take precautions to prevent a fall at home. Work with your health care provider to learn what changes you can make to improve your health and wellness and to prevent falls. This information is not intended to replace advice given to you by your health care provider. Make sure you discuss any questions you have with your health care provider. Document Revised: 10/11/2020 Document Reviewed: 10/11/2020 Elsevier Patient Education  2023 Elsevier Inc.  

## 2022-07-25 ENCOUNTER — Other Ambulatory Visit: Payer: Self-pay | Admitting: Family

## 2022-07-25 LAB — CBC WITH DIFFERENTIAL/PLATELET
Basophils Absolute: 0 10*3/uL (ref 0.0–0.2)
Basos: 1 %
EOS (ABSOLUTE): 0.2 10*3/uL (ref 0.0–0.4)
Eos: 2 %
Hematocrit: 42.3 % (ref 37.5–51.0)
Hemoglobin: 13.9 g/dL (ref 13.0–17.7)
Immature Grans (Abs): 0 10*3/uL (ref 0.0–0.1)
Immature Granulocytes: 0 %
Lymphocytes Absolute: 1.5 10*3/uL (ref 0.7–3.1)
Lymphs: 19 %
MCH: 29.1 pg (ref 26.6–33.0)
MCHC: 32.9 g/dL (ref 31.5–35.7)
MCV: 89 fL (ref 79–97)
Monocytes Absolute: 0.4 10*3/uL (ref 0.1–0.9)
Monocytes: 5 %
Neutrophils Absolute: 5.8 10*3/uL (ref 1.4–7.0)
Neutrophils: 73 %
Platelets: 144 10*3/uL — ABNORMAL LOW (ref 150–450)
RBC: 4.78 x10E6/uL (ref 4.14–5.80)
RDW: 12.5 % (ref 11.6–15.4)
WBC: 8 10*3/uL (ref 3.4–10.8)

## 2022-07-25 LAB — CMP14+EGFR
ALT: 51 IU/L — ABNORMAL HIGH (ref 0–44)
AST: 37 IU/L (ref 0–40)
Albumin/Globulin Ratio: 1.5 (ref 1.2–2.2)
Albumin: 4.5 g/dL (ref 3.8–4.8)
Alkaline Phosphatase: 81 IU/L (ref 44–121)
BUN/Creatinine Ratio: 14 (ref 10–24)
BUN: 15 mg/dL (ref 8–27)
Bilirubin Total: 0.4 mg/dL (ref 0.0–1.2)
CO2: 24 mmol/L (ref 20–29)
Calcium: 9.7 mg/dL (ref 8.6–10.2)
Chloride: 96 mmol/L (ref 96–106)
Creatinine, Ser: 1.06 mg/dL (ref 0.76–1.27)
Globulin, Total: 3 g/dL (ref 1.5–4.5)
Glucose: 172 mg/dL — ABNORMAL HIGH (ref 70–99)
Potassium: 4.2 mmol/L (ref 3.5–5.2)
Sodium: 140 mmol/L (ref 134–144)
Total Protein: 7.5 g/dL (ref 6.0–8.5)
eGFR: 74 mL/min/{1.73_m2} (ref >59)

## 2022-07-25 LAB — TSH: TSH: 3.12 u[IU]/mL (ref 0.450–4.500)

## 2022-07-25 LAB — LIPID PANEL
Chol/HDL Ratio: 7.4 ratio — ABNORMAL HIGH (ref 0.0–5.0)
Cholesterol, Total: 272 mg/dL — ABNORMAL HIGH (ref 100–199)
HDL: 37 mg/dL — ABNORMAL LOW (ref >39)
LDL Chol Calc (NIH): 199 mg/dL — ABNORMAL HIGH (ref 0–99)
Triglycerides: 190 mg/dL — ABNORMAL HIGH (ref 0–149)
VLDL Cholesterol Cal: 36 mg/dL (ref 5–40)

## 2022-07-25 LAB — VITAMIN D 25 HYDROXY (VIT D DEFICIENCY, FRACTURES): Vit D, 25-Hydroxy: 18.4 ng/mL — ABNORMAL LOW (ref 30.0–100.0)

## 2022-07-25 MED ORDER — VITAMIN D (ERGOCALCIFEROL) 1.25 MG (50000 UNIT) PO CAPS: 50000.0000 [IU] | ORAL_CAPSULE | ORAL | 3 refills | Status: DC

## 2022-07-27 ENCOUNTER — Other Ambulatory Visit: Payer: Self-pay | Admitting: Family

## 2022-08-02 ENCOUNTER — Other Ambulatory Visit: Payer: Self-pay | Admitting: Family

## 2022-08-02 DIAGNOSIS — M10071 Idiopathic gout, right ankle and foot: Secondary | ICD-10-CM

## 2022-08-02 DIAGNOSIS — M109 Gout, unspecified: Secondary | ICD-10-CM

## 2022-08-06 ENCOUNTER — Other Ambulatory Visit: Payer: Self-pay | Admitting: Family

## 2022-08-14 ENCOUNTER — Telehealth: Payer: Self-pay | Admitting: Family

## 2022-08-14 NOTE — Telephone Encounter (Signed)
Called patient to schedule Medicare Annual Wellness Visit (AWV). Left message for patient to call back and schedule Medicare Annual Wellness Visit (AWV).  Last date of AWV: 08/04/2021   Please schedule an appointment at any time with either Mickel Baas or [place], N[facility]. .  If any questions, please contact me at [phone].  Thank you,  [name],  [place] [facility] Direct Dial ??[phone]

## 2022-08-15 ENCOUNTER — Telehealth: Payer: Self-pay | Admitting: Family

## 2022-08-15 NOTE — Telephone Encounter (Signed)
Contacted Matthew May to schedule their annual wellness visit. Appointment made for 08/25/2022.  Thank you,  [name],  [place] [facility] Direct Dial ??[phone]

## 2022-08-25 ENCOUNTER — Ambulatory Visit (INDEPENDENT_AMBULATORY_CARE_PROVIDER_SITE_OTHER): Payer: Medicare Other

## 2022-08-25 ENCOUNTER — Other Ambulatory Visit: Payer: Self-pay | Admitting: Family

## 2022-08-25 VITALS — Ht 66.0 in | Wt 245.0 lb

## 2022-08-25 DIAGNOSIS — Z0000 Encounter for general adult medical examination without abnormal findings: Secondary | ICD-10-CM

## 2022-08-25 DIAGNOSIS — M109 Gout, unspecified: Secondary | ICD-10-CM

## 2022-08-25 DIAGNOSIS — M10071 Idiopathic gout, right ankle and foot: Secondary | ICD-10-CM

## 2022-08-25 NOTE — Progress Notes (Signed)
Subjective:   Matthew May is a 77 y.o. male who presents for Medicare Annual/Subsequent preventive examination. I connected with  Keldon Airey Schrodt on 08/25/22 by a audio enabled telemedicine application and verified that I am speaking with the correct person using two identifiers.  Patient Location: Home  Provider Location: Home Office  I discussed the limitations of evaluation and management by telemedicine. The patient expressed understanding and agreed to proceed.  Review of Systems     Cardiac Risk Factors include: advanced age (>69men, >74 women);male gender;hypertension;dyslipidemia;diabetes mellitus     Objective:    Today's Vitals   08/25/22 0818  Weight: 245 lb (111.1 kg)  Height: 5\' 6"  (1.676 m)   Body mass index is 39.54 kg/m.     08/25/2022    8:23 AM 06/10/2022   10:56 AM 06/08/2022    1:24 PM 08/04/2021    9:39 AM 08/03/2020    8:23 AM 01/16/2020    9:43 AM 01/01/2020    2:14 PM  Advanced Directives  Does Patient Have a Medical Advance Directive? No No Yes Yes No No No  Type of Advance Directive   Out of facility DNR (pink MOST or yellow form) [place];Living will     Copy of [place] in Chart?    No - copy requested     Would patient like information on creating a medical advance directive? No - Patient declined No - Patient declined   No - Patient declined  No - Patient declined    Current Medications (verified) Outpatient Encounter Medications as of 08/25/2022  Medication Sig   acetaminophen (TYLENOL) 325 MG tablet Take 2 tablets (650 mg total) by mouth every 6 (six) hours as needed for mild pain (or Fever >/= 101).   albuterol (VENTOLIN HFA) 108 (90 Base) MCG/ACT inhaler INHALE 2 PUFFS BY MOUTH EVERY 6 HOURS AS NEEDED FOR WHEEZING AND FOR SHORTNESS OF BREATH   atorvastatin (LIPITOR) 40 MG tablet TAKE 1 TABLET BY MOUTH DAILY   Budeson-Glycopyrrol-Formoterol (BREZTRI AEROSPHERE) 160-9-4.8 MCG/ACT AERO Inhale 2 puffs  into the lungs 2 (two) times daily.   cetirizine (ZYRTEC) 10 MG tablet Take 1 tablet (10 mg total) by mouth daily.   cholecalciferol (VITAMIN D3) 25 MCG (1000 UT) tablet Take 1,000 Units by mouth daily.   dapagliflozin propanediol (FARXIGA) 10 MG TABS tablet Take 1 tablet (10 mg total) by mouth daily.   ELIQUIS 5 MG TABS tablet Take 1 tablet by mouth twice daily   glimepiride (AMARYL) 2 MG tablet TAKE 1 TABLET BY MOUTH DAILY  WITH BREAKFAST   glucose blood (ONETOUCH VERIO) test strip CHECK BLOOD SUGAR DAILY Dx E11.9   Lancets (ONETOUCH DELICA PLUS 123XX123) MISC CHECK BLOOD SUGAR DAILY Dx E11.9   levothyroxine (SYNTHROID) 150 MCG tablet TAKE 1 TABLET BY MOUTH DAILY   meclizine (ANTIVERT) 25 MG tablet TAKE 1 TABLET BY MOUTH THREE TIMES DAILY AS NEEDED FOR DIZZINESS   metFORMIN (GLUCOPHAGE) 1000 MG tablet TAKE 1 TABLET BY MOUTH TWICE  DAILY   nitroGLYCERIN (NITROSTAT) 0.4 MG SL tablet DISSOLVE ONE TABLET UNDER THE TONGUE EVERY 5 MINUTES AS NEEDED FOR CHEST PAIN.  DO NOT EXCEED A TOTAL OF 3 DOSES IN 15 MINUTES   potassium chloride SA (KLOR-CON M) 20 MEQ tablet TAKE 1 TABLET BY MOUTH DAILY   torsemide (DEMADEX) 20 MG tablet Take 2 tablets (40 mg total) by mouth daily.   Vitamin D, Ergocalciferol, (DRISDOL) 1.25 MG (50000 UNIT) CAPS capsule Take  1 capsule (50,000 Units total) by mouth every 7 (seven) days.   No facility-administered encounter medications on file as of 08/25/2022.    Allergies (verified) Prednisone, Januvia [sitagliptin], Jardiance [empagliflozin], and Lisinopril   History: Past Medical History:  Diagnosis Date   Atrial fibrillation ([place])    CAD (coronary artery disease)    a. s/p NSTEMI 4/11 => s/p CABG (L-LAD, S-OM2, S-PDA/PL);  b.  ETT-Myoview 6/14:  normal study, no ischemia, EF 67%   Carotid stenosis    Carotid U/S 123XX123:  RICA [id number], LICA [id number]; right vertebral flow retrograde suggestive of steel-consider PV consult   Cataract    COPD (chronic obstructive pulmonary  disease) (HCC)    Diabetes mellitus without complication ([place])    HLD (hyperlipidemia)    HTN (hypertension)    Obesity    Renal insufficiency    Severe aortic stenosis    Subclavian artery stenosis, right ([place])    based upon carotid U/S done 11/2012   Past Surgical History:  Procedure Laterality Date   CATARACT EXTRACTION Left    CORONARY ARTERY BYPASS GRAFT     2011, LIMA to LAD coronary artery, SVG to OM2 branch of lect circumflex coronary artery, and a sequential SVG to psot descening to posterolateral branches to RCA   endoscopic vein harvesting     right leg    HERNIA REPAIR     RIGHT/LEFT HEART CATH AND CORONARY/GRAFT ANGIOGRAPHY N/A 12/31/2017   Procedure: RIGHT/LEFT HEART CATH AND CORONARY/GRAFT ANGIOGRAPHY;  Surgeon: Leonie Man, MD;  Location: [facility];  Service: Cardiovascular;  Laterality: N/A;   RIGHT/LEFT HEART CATH AND CORONARY/GRAFT ANGIOGRAPHY N/A 12/30/2019   Procedure: RIGHT/LEFT HEART CATH AND CORONARY/GRAFT ANGIOGRAPHY;  Surgeon: Belva Crome, MD;  Location: [facility];  Service: Cardiovascular;  Laterality: N/A;   TEE WITHOUT CARDIOVERSION N/A 01/20/2020   Procedure: TRANSESOPHAGEAL ECHOCARDIOGRAM (TEE);  Surgeon: [name], MD;  Location: [facility];  Service: Open Heart Surgery;  Laterality: N/A;   TONSILLECTOMY     TRANSCATHETER AORTIC VALVE REPLACEMENT, TRANSFEMORAL N/A 01/20/2020   Procedure: TRANSCATHETER AORTIC VALVE REPLACEMENT, TRANSFEMORALUSING EDWARDS SAPIEN 3 29 MM AORTIC THV.;  Surgeon: [name], MD;  Location: [facility];  Service: Open Heart Surgery;  Laterality: N/A;   Family History  Problem Relation Age of Onset   Heart disease Father    Heart attack Father    Mental illness Mother    Mental illness Sister    Diabetes Brother    Depression Maternal Aunt    Social History   Socioeconomic History   Marital status: Married    Spouse name: Lucita Ferrara   Number of children: 3   Years of education: 8   Highest education  level: 8th grade  Occupational History   Occupation: Retired    Comment: scrap yard  Tobacco Use   Smoking status: Former    Types: Cigarettes    Quit date: 07/11/2009    Years since quitting: 13.1   Smokeless tobacco: Never   Tobacco comments:    smoked about 10 cig/day; used to smoke 2 ppd for many years   Vaping Use   Vaping Use: Never used  Substance and Sexual Activity   Alcohol use: No    Alcohol/week: 0.0 standard drinks of alcohol   Drug use: No   Sexual activity: Never  Other Topics Concern   Not on file  Social History Narrative   Lives at home with his wife. He is retired but his wife  continues to work daily. They have adult children that live locally and grandchildren that they spend time with regularly. He is active around his home and yard.    Social Determinants of Health   Financial Resource Strain: Low Risk  (08/25/2022)   Overall Financial Resource Strain (CARDIA)    Difficulty of Paying Living Expenses: Not hard at all  Food Insecurity: No Food Insecurity (08/25/2022)   Hunger Vital Sign    Worried About Running Out of Food in the Last Year: Never true    Ran Out of Food in the Last Year: Never true  Transportation Needs: No Transportation Needs (08/25/2022)   PRAPARE - Hydrologist (Medical): No    Lack of Transportation (Non-Medical): No  Physical Activity: Insufficiently Active (08/25/2022)   Exercise Vital Sign    Days of Exercise per Week: 3 days    Minutes of Exercise per Session: 30 min  Stress: No Stress Concern Present (08/25/2022)   [place]    Feeling of Stress : Not at all  Social Connections: Moderately Isolated (08/25/2022)   Social Connection and Isolation Panel [NHANES]    Frequency of Communication with Friends and Family: More than three times a week    Frequency of Social Gatherings with Friends and Family: More than three times a week     Attends Religious Services: Never    Marine scientist or Organizations: No    Attends Music therapist: Never    Marital Status: Married    Tobacco Counseling Counseling given: Not Answered Tobacco comments: smoked about 10 cig/day; used to smoke 2 ppd for many years    Clinical Intake:  Pre-visit preparation completed: Yes  Pain : No/denies pain     Nutritional Risks: None Diabetes: Yes CBG done?: No Did pt. bring in CBG monitor from home?: No  How often do you need to have someone help you when you read instructions, pamphlets, or other written materials from your doctor or pharmacy?: 1 - Never  Diabetic?yes  Nutrition Risk Assessment:  Has the patient had any N/V/D within the last 2 months?  No  Does the patient have any non-healing wounds?  No  Has the patient had any unintentional weight loss or weight gain?  No   Diabetes:  Is the patient diabetic?  Yes  If diabetic, was a CBG obtained today?  No  Did the patient bring in their glucometer from home?  No  How often do you monitor your CBG's? Daily .   Financial Strains and Diabetes Management:  Are you having any financial strains with the device, your supplies or your medication? No .  Does the patient want to be seen by Chronic Care Management for management of their diabetes?  No  Would the patient like to be referred to a Nutritionist or for Diabetic Management?  No   Diabetic Exams:  Diabetic Eye Exam: Overdue for diabetic eye exam. Pt has been advised about the importance in completing this exam. Patient advised to call and schedule an eye exam. Diabetic Foot Exam: Overdue, Pt has been advised about the importance in completing this exam. Pt is scheduled for diabetic foot exam on next office visit .   Interpreter Needed?: No  Information entered by :: Jadene Pierini, LPN   Activities of Daily Living    08/25/2022    8:23 AM 06/10/2022   10:55 AM  In your present state  of health,  do you have any difficulty performing the following activities:  Hearing? 0   Vision? 0   Difficulty concentrating or making decisions? 0   Walking or climbing stairs? 0   Dressing or bathing? 0   Doing errands, shopping? 0 0  Preparing Food and eating ? N   Using the Toilet? N   In the past six months, have you accidently leaked urine? N   Do you have problems with loss of bowel control? N   Managing your Medications? N   Managing your Finances? N   Housekeeping or managing your Housekeeping? N     Patient Care Team: Sharion Balloon, FNP as PCP - General (Nurse Practitioner) Minus Breeding, MD as PCP - Cardiology (Cardiology) Minus Breeding, MD as Consulting Physician (Cardiology) Lavera Guise, [facility] (Pharmacist)  Indicate any recent Medical Services you may have received from other than [facility] providers in the past year (date may be approximate).     Assessment:   This is a routine wellness examination for [facility].  Hearing/Vision screen Vision Screening - Comments:: Patient declines eye referral   Dietary issues and exercise activities discussed: Current Exercise Habits: Home exercise routine, Type of exercise: walking, Time (Minutes): 30, Frequency (Times/Week): 3, Weekly Exercise (Minutes/Week): 90, Intensity: Mild, Exercise limited by: None identified   Goals Addressed             This Visit's Progress    Exercise 150 min/wk Moderate Activity   On track    Light exercise daily       Depression Screen    08/25/2022    8:22 AM 07/24/2022    9:05 AM 06/23/2022    9:03 AM 10/04/2021   10:44 AM 08/04/2021    9:28 AM 06/14/2021    8:11 AM 04/15/2021   11:28 AM  PHQ 2/9 Scores  PHQ - 2 Score 0 0 0 0 0 0 0  PHQ- 9 Score 0 0 0        Fall Risk    08/25/2022    8:20 AM 07/24/2022    9:05 AM 06/23/2022    9:03 AM 10/04/2021   10:44 AM 08/04/2021    9:25 AM  Fall Risk   Falls in the past year? 0 0 0 0 0  Number falls in past yr: 0   0 0  Injury with Fall? 0   0 0   Risk for fall due to : No Fall Risks   History of fall(s) Impaired balance/gait  Follow up Falls prevention discussed   Falls evaluation completed Falls prevention discussed    FALL RISK PREVENTION PERTAINING TO THE HOME:  Any stairs in or around the home? No  If so, are there any without handrails? No  Home free of loose throw rugs in walkways, pet beds, electrical cords, etc? Yes  Adequate lighting in your home to reduce risk of falls? Yes   ASSISTIVE DEVICES UTILIZED TO PREVENT FALLS:  Life alert? No  Use of a cane, walker or w/c? Yes  Grab bars in the bathroom? No  Shower chair or bench in shower? No  Elevated toilet seat or a handicapped toilet? No       04/25/2017    2:18 PM 02/23/2016    3:27 PM 09/17/2014    8:39 AM  MMSE - Mini Mental State Exam  Orientation to time 5 5 5   Orientation to Place 5 5 5   Registration 3 3 3   Attention/ Calculation 4 4  5  Recall 2 2 1   Language- name 2 objects 2 2 2   Language- repeat 1 1 1   Language- follow 3 step command 2 2 3   Language- read & follow direction 1 1 1   Write a sentence 1 1 1   Copy design 1 1 1   Total score 27 27 28         08/25/2022    8:24 AM 08/04/2021    9:38 AM 08/03/2020    8:28 AM 07/30/2019    8:46 AM 04/26/2018   10:53 AM  6CIT Screen  What Year? 0 points 0 points 0 points 0 points 0 points  What month? 0 points 0 points 0 points 0 points 0 points  What time? 0 points 0 points 0 points 0 points 0 points  Count back from 20 0 points 0 points 0 points 0 points 0 points  Months in reverse 0 points 2 points 0 points 2 points 0 points  Repeat phrase 0 points 0 points 6 points 0 points 0 points  Total Score 0 points 2 points 6 points 2 points 0 points    Immunizations Immunization History  Administered Date(s) Administered   Fluad Quad(high Dose 65+) 02/07/2019, 03/14/2021, 07/24/2022   Influenza, High Dose Seasonal PF 03/31/2014, 04/24/2015, 03/29/2017, 03/08/2018   Influenza-Unspecified 02/03/2013,  03/31/2016, 03/29/2017, 04/30/2020   Moderna Sars-Covid-2 Vaccination 08/04/2019, 09/04/2019, 04/19/2020   Pneumococcal Conjugate-13 09/16/2014   Pneumococcal Polysaccharide-23 08/06/2012   Td 07/23/2020   Zoster Recombinat (Shingrix) 11/11/2020, 07/24/2022    TDAP status: Up to date  Flu Vaccine status: Up to date  Pneumococcal vaccine status: Up to date  Covid-19 vaccine status: Completed vaccines  Qualifies for Shingles Vaccine? Yes   Zostavax completed Yes   Shingrix Completed?: Yes  Screening Tests Health Maintenance  Topic Date Due   OPHTHALMOLOGY EXAM  12/16/2020   FOOT EXAM  08/10/2021   COVID-19 Vaccine (4 - 2023-24 season) 02/03/2022   Fecal DNA (Cologuard)  10/27/2022   HEMOGLOBIN A1C  12/07/2022   Diabetic kidney evaluation - Urine ACR  01/24/2023   Diabetic kidney evaluation - eGFR measurement  07/25/2023   Medicare Annual Wellness (AWV)  08/25/2023   DTaP/Tdap/Td (2 - Tdap) 07/23/2030   Pneumonia Vaccine 67+ Years old  Completed   INFLUENZA VACCINE  Completed   Hepatitis C Screening  Completed   Zoster Vaccines- Shingrix  Completed   HPV VACCINES  Aged Out    Health Maintenance  Health Maintenance Due  Topic Date Due   OPHTHALMOLOGY EXAM  12/16/2020   FOOT EXAM  08/10/2021   COVID-19 Vaccine (4 - 2023-24 season) 02/03/2022    Colorectal cancer screening: No longer required.   Lung Cancer Screening: (Low Dose CT Chest recommended if Age 46-80 years, 30 pack-year currently smoking OR have quit w/in 15years.) does not qualify.   Lung Cancer Screening Referral: n/a  Additional Screening:  Hepatitis C Screening: does not qualify; Completed 08/23/2015  Vision Screening: Recommended annual ophthalmology exams for early detection of glaucoma and other disorders of the eye. Is the patient up to date with their annual eye exam?  No  Who is the provider or what is the name of the office in which the patient attends annual eye exams? Patient declines  If  pt is not established with a provider, would they like to be referred to a provider to establish care? No .   Dental Screening: Recommended annual dental exams for proper oral hygiene  Community Resource Referral / Chronic Care  Management: CRR required this visit?  No   CCM required this visit?  No      Plan:     I have personally reviewed and noted the following in the patient's chart:   Medical and social history Use of alcohol, tobacco or illicit drugs  Current medications and supplements including opioid prescriptions. Patient is not currently taking opioid prescriptions. Functional ability and status Nutritional status Physical activity Advanced directives List of other physicians Hospitalizations, surgeries, and ER visits in previous 12 months Vitals Screenings to include cognitive, depression, and falls Referrals and appointments  In addition, I have reviewed and discussed with patient certain preventive protocols, quality metrics, and best practice recommendations. A written personalized care plan for preventive services as well as general preventive health recommendations were provided to patient.     Daphane Shepherd, LPN   [id number]   Nurse Notes: Patient decline eye referral

## 2022-08-25 NOTE — Patient Instructions (Signed)
Matthew May , Thank you for taking time to come for your Medicare Wellness Visit. I appreciate your ongoing commitment to your health goals. Please review the following plan we discussed and let me know if I can assist you in the future.   These are the goals we discussed:  Goals      AWV     08/03/2020 AWV Goal: Exercise for General Health  Patient will verbalize understanding of the benefits of increased physical activity: Exercising regularly is important. It will improve your overall fitness, flexibility, and endurance. Regular exercise also will improve your overall health. It can help you control your weight, reduce stress, and improve your bone density. Over the next year, patient will increase physical activity as tolerated with a goal of at least 150 minutes of moderate physical activity per week.  You can tell that you are exercising at a moderate intensity if your heart starts beating faster and you start breathing faster but can still hold a conversation. Moderate-intensity exercise ideas include: Walking 1 mile (1.6 km) in about 15 minutes Biking Hiking Golfing Dancing Water aerobics Patient will verbalize understanding of everyday activities that increase physical activity by providing examples like the following: Yard work, such as: Sales promotion account executive Gardening Washing windows or floors Patient will be able to explain general safety guidelines for exercising:  Before you start a new exercise program, talk with your health care provider. Do not exercise so much that you hurt yourself, feel dizzy, or get very short of breath. Wear comfortable clothes and wear shoes with good support. Drink plenty of water while you exercise to prevent dehydration or heat stroke. Work out until your breathing and your heartbeat get faster.      Exercise 150 min/wk Moderate Activity     Light exercise  daily     Patient Stated     07/30/2019 AWV Goal: Improved Nutrition/Diet  Patient will verbalize understanding that diet plays an important role in overall health and that a poor diet is a risk factor for many chronic medical conditions.  Over the next year, patient will improve self management of their diet by incorporating low carb choices. Patient will utilize available community resources to help with food acquisition if needed (ex: food pantries, [address], etc)         This is a list of the screening recommended for you and due dates:  Health Maintenance  Topic Date Due   Eye exam for diabetics  12/16/2020   Complete foot exam   08/10/2021   COVID-19 Vaccine (4 - 2023-24 season) 02/03/2022   Cologuard (Stool DNA test)  10/27/2022   Hemoglobin A1C  12/07/2022   Yearly kidney health urinalysis for diabetes  01/24/2023   Yearly kidney function blood test for diabetes  07/25/2023   Medicare Annual Wellness Visit  08/25/2023   DTaP/Tdap/Td vaccine (2 - Tdap) 07/23/2030   Pneumonia Vaccine  Completed   Flu Shot  Completed   Hepatitis C Screening: USPSTF Recommendation to screen - Ages 18-79 yo.  Completed   Zoster (Shingles) Vaccine  Completed   HPV Vaccine  Aged Out    Advanced directives: .ahad  Conditions/risks identified: Aim for 30 minutes of exercise or brisk walking, 6-8 glasses of water, and 5 servings of fruits and vegetables each day.   Next appointment: Follow up in one year for your annual wellness visit.   Preventive Care 65 Years  and Older, Male  Preventive care refers to lifestyle choices and visits with your health care provider that can promote health and wellness. What does preventive care include? A yearly physical exam. This is also called an annual well check. Dental exams once or twice a year. Routine eye exams. Ask your health care provider how often you should have your eyes checked. Personal lifestyle choices, including: Daily care of your teeth  and gums. Regular physical activity. Eating a healthy diet. Avoiding tobacco and drug use. Limiting alcohol use. Practicing safe sex. Taking low doses of aspirin every day. Taking vitamin and mineral supplements as recommended by your health care provider. What happens during an annual well check? The services and screenings done by your health care provider during your annual well check will depend on your age, overall health, lifestyle risk factors, and family history of disease. Counseling  Your health care provider may ask you questions about your: Alcohol use. Tobacco use. Drug use. Emotional well-being. Home and relationship well-being. Sexual activity. Eating habits. History of falls. Memory and ability to understand (cognition). Work and work Statistician. Screening  You may have the following tests or measurements: Height, weight, and BMI. Blood pressure. Lipid and cholesterol levels. These may be checked every 5 years, or more frequently if you are over 40 years old. Skin check. Lung cancer screening. You may have this screening every year starting at age 88 if you have a 30-pack-year history of smoking and currently smoke or have quit within the past 15 years. Fecal occult blood test (FOBT) of the stool. You may have this test every year starting at age 60. Flexible sigmoidoscopy or colonoscopy. You may have a sigmoidoscopy every 5 years or a colonoscopy every 10 years starting at age 61. Prostate cancer screening. Recommendations will vary depending on your family history and other risks. Hepatitis C blood test. Hepatitis B blood test. Sexually transmitted disease (STD) testing. Diabetes screening. This is done by checking your blood sugar (glucose) after you have not eaten for a while (fasting). You may have this done every 1-3 years. Abdominal aortic aneurysm (AAA) screening. You may need this if you are a current or former smoker. Osteoporosis. You may be screened  starting at age 9 if you are at high risk. Talk with your health care provider about your test results, treatment options, and if necessary, the need for more tests. Vaccines  Your health care provider may recommend certain vaccines, such as: Influenza vaccine. This is recommended every year. Tetanus, diphtheria, and acellular pertussis (Tdap, Td) vaccine. You may need a Td booster every 10 years. Zoster vaccine. You may need this after age 60. Pneumococcal 13-valent conjugate (PCV13) vaccine. One dose is recommended after age 48. Pneumococcal polysaccharide (PPSV23) vaccine. One dose is recommended after age 82. Talk to your health care provider about which screenings and vaccines you need and how often you need them. This information is not intended to replace advice given to you by your health care provider. Make sure you discuss any questions you have with your health care provider. Document Released: 06/18/2015 Document Revised: 02/09/2016 Document Reviewed: 03/23/2015 Elsevier Interactive Patient Education  2017 [place] Prevention in the Home Falls can cause injuries. They can happen to people of all ages. There are many things you can do to make your home safe and to help prevent falls. What can I do on the outside of my home? Regularly fix the edges of walkways and driveways and fix any  cracks. Remove anything that might make you trip as you walk through a door, such as a raised step or threshold. Trim any bushes or trees on the path to your home. Use bright outdoor lighting. Clear any walking paths of anything that might make someone trip, such as rocks or tools. Regularly check to see if handrails are loose or broken. Make sure that both sides of any steps have handrails. Any raised decks and porches should have guardrails on the edges. Have any leaves, snow, or ice cleared regularly. Use sand or salt on walking paths during winter. Clean up any spills in your garage  right away. This includes oil or grease spills. What can I do in the bathroom? Use night lights. Install grab bars by the toilet and in the tub and shower. Do not use towel bars as grab bars. Use non-skid mats or decals in the tub or shower. If you need to sit down in the shower, use a plastic, non-slip stool. Keep the floor dry. Clean up any water that spills on the floor as soon as it happens. Remove soap buildup in the tub or shower regularly. Attach bath mats securely with double-sided non-slip rug tape. Do not have throw rugs and other things on the floor that can make you trip. What can I do in the bedroom? Use night lights. Make sure that you have a light by your bed that is easy to reach. Do not use any sheets or blankets that are too big for your bed. They should not hang down onto the floor. Have a firm chair that has side arms. You can use this for support while you get dressed. Do not have throw rugs and other things on the floor that can make you trip. What can I do in the kitchen? Clean up any spills right away. Avoid walking on wet floors. Keep items that you use a lot in easy-to-reach places. If you need to reach something above you, use a strong step stool that has a grab bar. Keep electrical cords out of the way. Do not use floor polish or wax that makes floors slippery. If you must use wax, use non-skid floor wax. Do not have throw rugs and other things on the floor that can make you trip. What can I do with my stairs? Do not leave any items on the stairs. Make sure that there are handrails on both sides of the stairs and use them. Fix handrails that are broken or loose. Make sure that handrails are as long as the stairways. Check any carpeting to make sure that it is firmly attached to the stairs. Fix any carpet that is loose or worn. Avoid having throw rugs at the top or bottom of the stairs. If you do have throw rugs, attach them to the floor with carpet tape. Make  sure that you have a light switch at the top of the stairs and the bottom of the stairs. If you do not have them, ask someone to add them for you. What else can I do to help prevent falls? Wear shoes that: Do not have high heels. Have rubber bottoms. Are comfortable and fit you well. Are closed at the toe. Do not wear sandals. If you use a stepladder: Make sure that it is fully opened. Do not climb a closed stepladder. Make sure that both sides of the stepladder are locked into place. Ask someone to hold it for you, if possible. Clearly mark and make sure  that you can see: Any grab bars or handrails. First and last steps. Where the edge of each step is. Use tools that help you move around (mobility aids) if they are needed. These include: Canes. Walkers. Scooters. Crutches. Turn on the lights when you go into a dark area. Replace any light bulbs as soon as they burn out. Set up your furniture so you have a clear path. Avoid moving your furniture around. If any of your floors are uneven, fix them. If there are any pets around you, be aware of where they are. Review your medicines with your doctor. Some medicines can make you feel dizzy. This can increase your chance of falling. Ask your doctor what other things that you can do to help prevent falls. This information is not intended to replace advice given to you by your health care provider. Make sure you discuss any questions you have with your health care provider. Document Released: 03/18/2009 Document Revised: 10/28/2015 Document Reviewed: 06/26/2014 Elsevier Interactive Patient Education  2017 [employer].

## 2022-08-28 ENCOUNTER — Encounter: Payer: Self-pay | Admitting: *Deleted

## 2022-09-07 ENCOUNTER — Other Ambulatory Visit: Payer: Self-pay | Admitting: Family

## 2022-09-07 DIAGNOSIS — E119 Type 2 diabetes mellitus without complications: Secondary | ICD-10-CM

## 2022-09-11 ENCOUNTER — Other Ambulatory Visit: Payer: Self-pay | Admitting: Family

## 2022-09-11 DIAGNOSIS — M10071 Idiopathic gout, right ankle and foot: Secondary | ICD-10-CM

## 2022-09-11 DIAGNOSIS — M109 Gout, unspecified: Secondary | ICD-10-CM

## 2022-09-18 ENCOUNTER — Other Ambulatory Visit: Payer: Self-pay | Admitting: *Deleted

## 2022-09-18 DIAGNOSIS — R0789 Other chest pain: Secondary | ICD-10-CM

## 2022-09-18 MED ORDER — NITROGLYCERIN 0.4 MG SL SUBL
SUBLINGUAL_TABLET | SUBLINGUAL | 1 refills | Status: DC
Start: 2022-09-18 — End: 2023-07-27

## 2022-10-01 ENCOUNTER — Other Ambulatory Visit: Payer: Self-pay | Admitting: Family

## 2022-10-09 ENCOUNTER — Other Ambulatory Visit: Payer: Self-pay | Admitting: Family

## 2022-10-09 DIAGNOSIS — M109 Gout, unspecified: Secondary | ICD-10-CM

## 2022-10-09 DIAGNOSIS — M10071 Idiopathic gout, right ankle and foot: Secondary | ICD-10-CM

## 2022-10-24 ENCOUNTER — Ambulatory Visit: Payer: Medicare Other | Admitting: Family

## 2022-10-25 ENCOUNTER — Telehealth: Payer: Self-pay | Admitting: Family

## 2022-10-26 ENCOUNTER — Encounter: Payer: Self-pay | Admitting: Family

## 2022-10-26 ENCOUNTER — Ambulatory Visit (INDEPENDENT_AMBULATORY_CARE_PROVIDER_SITE_OTHER): Payer: Medicare Other | Admitting: Family

## 2022-10-26 VITALS — BP 112/79 | HR 91 | Temp 97.5°F | Ht 66.0 in | Wt 243.0 lb

## 2022-10-26 DIAGNOSIS — E1142 Type 2 diabetes mellitus with diabetic polyneuropathy: Secondary | ICD-10-CM

## 2022-10-26 DIAGNOSIS — E038 Other specified hypothyroidism: Secondary | ICD-10-CM | POA: Diagnosis not present

## 2022-10-26 DIAGNOSIS — J441 Chronic obstructive pulmonary disease with (acute) exacerbation: Secondary | ICD-10-CM | POA: Diagnosis not present

## 2022-10-26 DIAGNOSIS — I2510 Atherosclerotic heart disease of native coronary artery without angina pectoris: Secondary | ICD-10-CM

## 2022-10-26 DIAGNOSIS — I480 Paroxysmal atrial fibrillation: Secondary | ICD-10-CM | POA: Diagnosis not present

## 2022-10-26 DIAGNOSIS — H6123 Impacted cerumen, bilateral: Secondary | ICD-10-CM

## 2022-10-26 DIAGNOSIS — E559 Vitamin D deficiency, unspecified: Secondary | ICD-10-CM | POA: Diagnosis not present

## 2022-10-26 DIAGNOSIS — E1169 Type 2 diabetes mellitus with other specified complication: Secondary | ICD-10-CM | POA: Diagnosis not present

## 2022-10-26 DIAGNOSIS — M1A09X0 Idiopathic chronic gout, multiple sites, without tophus (tophi): Secondary | ICD-10-CM | POA: Diagnosis not present

## 2022-10-26 DIAGNOSIS — E785 Hyperlipidemia, unspecified: Secondary | ICD-10-CM | POA: Diagnosis not present

## 2022-10-26 DIAGNOSIS — I10 Essential (primary) hypertension: Secondary | ICD-10-CM | POA: Diagnosis not present

## 2022-10-26 LAB — BAYER DCA HB A1C WAIVED: HB A1C (BAYER DCA - WAIVED): 8.8 % — ABNORMAL HIGH (ref 4.8–5.6)

## 2022-10-26 MED ORDER — GABAPENTIN 100 MG PO CAPS
100.0000 mg | ORAL_CAPSULE | Freq: Three times a day (TID) | ORAL | 3 refills | Status: DC
Start: 2022-10-26 — End: 2022-11-24

## 2022-10-26 MED ORDER — COLCHICINE 0.6 MG PO TABS
ORAL_TABLET | ORAL | 1 refills | Status: DC
Start: 2022-10-26 — End: 2023-01-30

## 2022-10-26 NOTE — Progress Notes (Signed)
Subjective:    Patient ID: ARDARIUS HAASCH, male    DOB: 1945-11-25, 77 y.o.   MRN: [id number]  Chief Complaint  Patient presents with   Medical Management of Chronic Issues   Pt calls the office today  chronic follow up. Pt is followed by Cardiologists CAD, aortic stenosis, and A Fib. 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    He is moribid obese with a BMI of 39 and co moribitiy of DM and HTN.    He has COPD and states this is stable. Quit smoking in 2011. Takes Breztri BID.   Complaining of numbness and burning in bilateral feet and legs of 7-8 out 10.  Hypertension This is a chronic problem. The current episode started more than 1 year ago. The problem has been resolved since onset. The problem is controlled. Associated symptoms include malaise/fatigue and peripheral edema. Pertinent negatives include no blurred vision or shortness of breath. Risk factors for coronary artery disease include dyslipidemia, diabetes mellitus, obesity, male gender and sedentary lifestyle. The current treatment provides moderate improvement. Identifiable causes of hypertension include a thyroid problem.  Congestive Heart Failure Presents for follow-up visit. Associated symptoms include edema and fatigue. Pertinent negatives include no shortness of breath. The symptoms have been stable.  Thyroid Problem Presents for follow-up visit. Symptoms include fatigue and hoarse voice. Patient reports no anxiety. The symptoms have been stable. His past medical history is significant for hyperlipidemia.  Hyperlipidemia This is a chronic problem. The current episode started more than 1 year ago. Exacerbating diseases include obesity. Pertinent negatives include no shortness of breath. Current antihyperlipidemic treatment includes statins. The current treatment provides moderate improvement of lipids. Risk factors for coronary artery disease include dyslipidemia, diabetes mellitus, hypertension, a  sedentary lifestyle, obesity and male sex.  Diabetes He presents for his follow-up diabetic visit. He has type 2 diabetes mellitus. Pertinent negatives for hypoglycemia include no nervousness/anxiousness. Associated symptoms include fatigue and foot paresthesias. Pertinent negatives for diabetes include no blurred vision. Symptoms are stable. Diabetic complications include heart disease and peripheral neuropathy. Risk factors for coronary artery disease include dyslipidemia, diabetes mellitus, hypertension, male sex and sedentary lifestyle. He is following a generally unhealthy diet. His overall blood glucose range is >200 mg/dl.      Review of Systems  Constitutional:  Positive for fatigue and malaise/fatigue.  HENT:  Positive for hoarse voice.   Eyes:  Negative for blurred vision.  Respiratory:  Negative for shortness of breath.   Psychiatric/Behavioral:  The patient is not nervous/anxious.   All other systems reviewed and are negative.      Objective:   Physical Exam Vitals reviewed.  Constitutional:      General: He is not in acute distress.    Appearance: He is well-developed. He is obese.  HENT:     Head: Normocephalic.     Right Ear: There is impacted cerumen.     Left Ear: There is impacted cerumen.  Eyes:     General:        Right eye: No discharge.        Left eye: No discharge.     Pupils: Pupils are equal, round, and reactive to light.  Neck:     Thyroid: No thyromegaly.  Cardiovascular:     Rate and Rhythm: Normal rate and regular rhythm.     Heart sounds: Normal heart sounds. No murmur heard. Pulmonary:     Effort: Pulmonary effort is normal. No respiratory distress.  Breath sounds: Normal breath sounds. No wheezing.  Abdominal:     General: Bowel sounds are normal. There is no distension.     Palpations: Abdomen is soft.     Tenderness: There is no abdominal tenderness.  Musculoskeletal:        General: No tenderness.     Cervical back: Normal range of  motion and neck supple.     Right lower leg: Edema (2+) present.     Left lower leg: Edema (2+) present.     Comments: Bilateral discoloration and erythemas   Skin:    General: Skin is warm and dry.     Findings: No erythema or rash.  Neurological:     Mental Status: He is alert and oriented to person, place, and time.     Cranial Nerves: No cranial nerve deficit.     Motor: Weakness present.     Deep Tendon Reflexes: Reflexes are normal and symmetric.  Psychiatric:        Behavior: Behavior normal.        Thought Content: Thought content normal.        Judgment: Judgment normal.     Diabetic Foot Exam - Simple   Simple Foot Form Diabetic Foot exam was performed with the following findings: Yes 10/26/2022  9:14 AM  Visual Inspection See comments: Yes Sensation Testing See comments: Yes Pulse Check See comments: Yes Comments PAD, bilateral discoloration present, negative monofilament    Bilateral ears washed with warm water and peroxide. TM WNL  BP 112/79   Pulse 91   Temp (!) 97.5 F (36.4 C) (Temporal)   Ht 5\' 6"  (1.676 m)   Wt 243 lb (110.2 kg)   SpO2 96%   BMI 39.22 kg/m      Assessment & Plan:  Quintrell Hamlett Garro comes in today with chief complaint of Medical Management of Chronic Issues   Diagnosis and orders addressed:  1. COPD with acute exacerbation (HCC) - CMP14+EGFR  2. Atherosclerosis of native coronary artery of native heart without angina pectoris - CMP14+EGFR  3. Type 2 diabetes mellitus with other specified complication, without long-term current use of insulin (HCC) - Bayer DCA Hb A1c Waived - CMP14+EGFR  4. Primary hypertension - CMP14+EGFR  5. Hyperlipidemia associated with type 2 diabetes mellitus (HCC) - CMP14+EGFR  6. Other specified hypothyroidism - CMP14+EGFR  7. Morbid obesity (HCC) - CMP14+EGFR  8. Paroxysmal atrial fibrillation (HCC) - CMP14+EGFR  9. Severe aortic stenosis - CMP14+EGFR  10. Vitamin D deficiency -  CMP14+EGFR  11. Chronic gout of multiple sites, unspecified cause - colchicine 0.6 MG tablet; Take 1.2 mg for gout flare up, then 0.6 mg one hour later. Max 1.8 mg daily.  Dispense: 90 tablet; Refill: 1 - CMP14+EGFR  12. Diabetic polyneuropathy associated with type 2 diabetes mellitus (HCC) Start gabapentin 100 mg TID Need good control of glucose  - gabapentin (NEURONTIN) 100 MG capsule; Take 1 capsule (100 mg total) by mouth 3 (three) times daily.  Dispense: 90 capsule; Refill: 3  12. Bilateral impacted cerumen    Labs pending Health Maintenance reviewed Diet and exercise encouraged  Follow up plan: 1 month to recheck neuropathy    [name], FNP

## 2022-10-26 NOTE — Patient Instructions (Signed)

## 2022-10-27 ENCOUNTER — Other Ambulatory Visit: Payer: Self-pay | Admitting: Family

## 2022-10-27 LAB — CMP14+EGFR
ALT: 59 IU/L — ABNORMAL HIGH (ref 0–44)
AST: 58 IU/L — ABNORMAL HIGH (ref 0–40)
Albumin/Globulin Ratio: 1.3 (ref 1.2–2.2)
Albumin: 4.2 g/dL (ref 3.8–4.8)
Alkaline Phosphatase: 87 IU/L (ref 44–121)
BUN/Creatinine Ratio: 19 (ref 10–24)
BUN: 21 mg/dL (ref 8–27)
Bilirubin Total: 0.4 mg/dL (ref 0.0–1.2)
CO2: 24 mmol/L (ref 20–29)
Calcium: 9.6 mg/dL (ref 8.6–10.2)
Chloride: 96 mmol/L (ref 96–106)
Creatinine, Ser: 1.08 mg/dL (ref 0.76–1.27)
Globulin, Total: 3.3 g/dL (ref 1.5–4.5)
Glucose: 240 mg/dL — ABNORMAL HIGH (ref 70–99)
Potassium: 4 mmol/L (ref 3.5–5.2)
Sodium: 137 mmol/L (ref 134–144)
Total Protein: 7.5 g/dL (ref 6.0–8.5)
eGFR: 72 mL/min/1.73

## 2022-10-27 MED ORDER — SEMAGLUTIDE(0.25 OR 0.5MG/DOS) 2 MG/3ML ~~LOC~~ SOPN: 0.2500 mg | PEN_INJECTOR | SUBCUTANEOUS | 2 refills | Status: DC

## 2022-11-10 ENCOUNTER — Other Ambulatory Visit: Payer: Self-pay | Admitting: Family

## 2022-11-11 ENCOUNTER — Other Ambulatory Visit: Payer: Self-pay | Admitting: Family

## 2022-11-24 ENCOUNTER — Telehealth: Payer: Self-pay | Admitting: Family

## 2022-11-24 ENCOUNTER — Encounter: Payer: Self-pay | Admitting: Family

## 2022-11-24 ENCOUNTER — Ambulatory Visit (INDEPENDENT_AMBULATORY_CARE_PROVIDER_SITE_OTHER): Payer: Medicare Other | Admitting: Family

## 2022-11-24 VITALS — BP 100/61 | HR 84 | Temp 97.4°F | Ht 66.0 in | Wt 245.6 lb

## 2022-11-24 DIAGNOSIS — E1159 Type 2 diabetes mellitus with other circulatory complications: Secondary | ICD-10-CM

## 2022-11-24 DIAGNOSIS — I152 Hypertension secondary to endocrine disorders: Secondary | ICD-10-CM | POA: Diagnosis not present

## 2022-11-24 DIAGNOSIS — E785 Hyperlipidemia, unspecified: Secondary | ICD-10-CM | POA: Diagnosis not present

## 2022-11-24 DIAGNOSIS — J441 Chronic obstructive pulmonary disease with (acute) exacerbation: Secondary | ICD-10-CM

## 2022-11-24 DIAGNOSIS — Z6839 Body mass index (BMI) 39.0-39.9, adult: Secondary | ICD-10-CM

## 2022-11-24 DIAGNOSIS — R600 Localized edema: Secondary | ICD-10-CM

## 2022-11-24 DIAGNOSIS — E1169 Type 2 diabetes mellitus with other specified complication: Secondary | ICD-10-CM

## 2022-11-24 DIAGNOSIS — E1142 Type 2 diabetes mellitus with diabetic polyneuropathy: Secondary | ICD-10-CM | POA: Diagnosis not present

## 2022-11-24 DIAGNOSIS — I480 Paroxysmal atrial fibrillation: Secondary | ICD-10-CM | POA: Diagnosis not present

## 2022-11-24 DIAGNOSIS — E559 Vitamin D deficiency, unspecified: Secondary | ICD-10-CM

## 2022-11-24 DIAGNOSIS — I10 Essential (primary) hypertension: Secondary | ICD-10-CM

## 2022-11-24 DIAGNOSIS — E038 Other specified hypothyroidism: Secondary | ICD-10-CM | POA: Diagnosis not present

## 2022-11-24 DIAGNOSIS — I2510 Atherosclerotic heart disease of native coronary artery without angina pectoris: Secondary | ICD-10-CM

## 2022-11-24 LAB — CMP14+EGFR
ALT: 74 IU/L — ABNORMAL HIGH (ref 0–44)
AST: 73 IU/L — ABNORMAL HIGH (ref 0–40)
Albumin: 4 g/dL (ref 3.8–4.8)
Alkaline Phosphatase: 83 IU/L (ref 44–121)
BUN/Creatinine Ratio: 14 (ref 10–24)
BUN: 13 mg/dL (ref 8–27)
Bilirubin Total: 0.4 mg/dL (ref 0.0–1.2)
CO2: 23 mmol/L (ref 20–29)
Calcium: 9.5 mg/dL (ref 8.6–10.2)
Chloride: 99 mmol/L (ref 96–106)
Creatinine, Ser: 0.93 mg/dL (ref 0.76–1.27)
Globulin, Total: 3 g/dL (ref 1.5–4.5)
Glucose: 186 mg/dL — ABNORMAL HIGH (ref 70–99)
Potassium: 4.3 mmol/L (ref 3.5–5.2)
Sodium: 138 mmol/L (ref 134–144)
Total Protein: 7 g/dL (ref 6.0–8.5)
eGFR: 86 mL/min/{1.73_m2} (ref >59)

## 2022-11-24 MED ORDER — LEVOTHYROXINE SODIUM 150 MCG PO TABS
150.0000 ug | ORAL_TABLET | Freq: Every day | ORAL | 3 refills | Status: DC
Start: 2022-11-24 — End: 2023-07-27

## 2022-11-24 MED ORDER — DAPAGLIFLOZIN PROPANEDIOL 10 MG PO TABS
10.0000 mg | ORAL_TABLET | Freq: Every day | ORAL | 4 refills | Status: DC
Start: 2022-11-24 — End: 2022-12-28

## 2022-11-24 MED ORDER — ATORVASTATIN CALCIUM 40 MG PO TABS
40.0000 mg | ORAL_TABLET | Freq: Every day | ORAL | 1 refills | Status: DC
Start: 2022-11-24 — End: 2023-02-01

## 2022-11-24 MED ORDER — GABAPENTIN 100 MG PO CAPS: 100.0000 mg | ORAL_CAPSULE | Freq: Three times a day (TID) | ORAL | 3 refills | Status: DC

## 2022-11-24 MED ORDER — GLIMEPIRIDE 2 MG PO TABS
2.0000 mg | ORAL_TABLET | Freq: Every day | ORAL | 0 refills | Status: DC
Start: 2022-11-24 — End: 2023-02-01

## 2022-11-24 MED ORDER — TORSEMIDE 20 MG PO TABS
40.0000 mg | ORAL_TABLET | Freq: Every day | ORAL | 1 refills | Status: DC
Start: 2022-11-24 — End: 2023-01-11

## 2022-11-24 MED ORDER — METFORMIN HCL 1000 MG PO TABS
1000.0000 mg | ORAL_TABLET | Freq: Two times a day (BID) | ORAL | 1 refills | Status: DC
Start: 2022-11-24 — End: 2023-04-16

## 2022-11-24 MED ORDER — APIXABAN 5 MG PO TABS
5.0000 mg | ORAL_TABLET | Freq: Two times a day (BID) | ORAL | 1 refills | Status: DC
Start: 2022-11-24 — End: 2023-01-02

## 2022-11-24 NOTE — Patient Instructions (Signed)

## 2022-11-24 NOTE — Progress Notes (Signed)
Subjective:    Patient ID: Matthew May, male    DOB: 07/12/1945, 77 y.o.   MRN: [id number]  Chief Complaint  Patient presents with   Follow-up    month to recheck neuropathy. Feels good he says    Pt calls the office today recheck neuropathy and uncontrolled DM. We started him on gabapentin 100 mg TID that has greatly helped. His A1C was 8.8 and we started him on Ozempic 0.25 mg, but states he could not afford this.    Pt is followed by Cardiologists CAD, aortic stenosis, and A Fib. 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    He is moribid obese with a BMI of 39 and co moribitiy of DM and HTN.    He has COPD and states this is stable. Quit smoking in 2011. Takes Breztri BID.    Complaining of numbness and burning in bilateral feet and legs of 2-3 out 10 since starting the gabapentin 100 mg TID.  Hypertension This is a chronic problem. The current episode started more than 1 year ago. The problem is unchanged. The problem is uncontrolled. Associated symptoms include malaise/fatigue and peripheral edema. Pertinent negatives include no blurred vision or shortness of breath. Risk factors for coronary artery disease include diabetes mellitus, dyslipidemia, male gender and sedentary lifestyle. The current treatment provides mild improvement. Identifiable causes of hypertension include a thyroid problem.  Congestive Heart Failure Presents for follow-up visit. Associated symptoms include edema and fatigue. Pertinent negatives include no shortness of breath. The symptoms have been stable.  Thyroid Problem Presents for follow-up visit. Symptoms include fatigue. Patient reports no anxiety or diarrhea. The symptoms have been stable. His past medical history is significant for hyperlipidemia.  Hyperlipidemia This is a chronic problem. The current episode started more than 1 year ago. Exacerbating diseases include obesity. Pertinent negatives include no shortness of breath. Current  antihyperlipidemic treatment includes statins. The current treatment provides moderate improvement of lipids. Risk factors for coronary artery disease include diabetes mellitus, dyslipidemia, hypertension, male sex and a sedentary lifestyle.  Diabetes He presents for his follow-up diabetic visit. He has type 2 diabetes mellitus. Pertinent negatives for hypoglycemia include no nervousness/anxiousness. Associated symptoms include fatigue and foot paresthesias. Pertinent negatives for diabetes include no blurred vision. Symptoms are stable. Diabetic complications include heart disease and peripheral neuropathy. Risk factors for coronary artery disease include dyslipidemia, diabetes mellitus, hypertension, male sex and sedentary lifestyle. His overall blood glucose range is >200 mg/dl.      Review of Systems  Constitutional:  Positive for fatigue and malaise/fatigue.  Eyes:  Negative for blurred vision.  Respiratory:  Negative for shortness of breath.   Gastrointestinal:  Negative for diarrhea.  Psychiatric/Behavioral:  The patient is not nervous/anxious.   All other systems reviewed and are negative.      Objective:   Physical Exam Vitals reviewed.  Constitutional:      General: He is not in acute distress.    Appearance: He is well-developed. He is obese.  HENT:     Head: Normocephalic.  Eyes:     General:        Right eye: No discharge.        Left eye: No discharge.     Pupils: Pupils are equal, round, and reactive to light.  Neck:     Thyroid: No thyromegaly.  Cardiovascular:     Rate and Rhythm: Normal rate and regular rhythm.     Heart sounds: Normal heart sounds. No murmur  heard. Pulmonary:     Effort: Pulmonary effort is normal. No respiratory distress.     Breath sounds: Normal breath sounds. No wheezing.  Abdominal:     General: Bowel sounds are normal. There is no distension.     Palpations: Abdomen is soft.     Tenderness: There is no abdominal tenderness.   Musculoskeletal:        General: No tenderness.     Cervical back: Normal range of motion and neck supple.     Right lower leg: Edema (2+) present.     Left lower leg: Edema (2+) present.     Comments: Discoloration bilaterally   Skin:    General: Skin is warm and dry.     Findings: No erythema or rash.  Neurological:     Mental Status: He is alert and oriented to person, place, and time.     Cranial Nerves: No cranial nerve deficit.     Motor: Weakness present.     Gait: Gait abnormal.     Deep Tendon Reflexes: Reflexes are normal and symmetric.     Comments: Using walker   Psychiatric:        Behavior: Behavior normal.        Thought Content: Thought content normal.        Judgment: Judgment normal.         BP 100/61   Pulse 84   Temp (!) 97.4 F (36.3 C) (Temporal)   Ht 5\' 6"  (1.676 m)   Wt 245 lb 9.6 oz (111.4 kg)   SpO2 98%   BMI 39.64 kg/m   Assessment & Plan:  [name] comes in today with chief complaint of Follow-up (month to recheck neuropathy. Feels good he says )   Diagnosis and orders addressed:  1. COPD with acute exacerbation (HCC) - AMB Referral to Chronic Care Management Services - CMP14+EGFR  2. Atherosclerosis of native coronary artery of native heart without angina pectoris - AMB Referral to Chronic Care Management Services - CMP14+EGFR  3. Type 2 diabetes mellitus with other specified complication, without long-term current use of insulin (HCC) Will restart Farxiga Referral to Clinical Pharmacist for medication assistance  Strict low carb diet - dapagliflozin propanediol (FARXIGA) 10 MG TABS tablet; Take 1 tablet (10 mg total) by mouth daily.  Dispense: 90 tablet; Refill: 4 - glimepiride (AMARYL) 2 MG tablet; Take 1 tablet (2 mg total) by mouth daily with breakfast.  Dispense: 90 tablet; Refill: 0 - metFORMIN (GLUCOPHAGE) 1000 MG tablet; Take 1 tablet (1,000 mg total) by mouth 2 (two) times daily.  Dispense: 180 tablet; Refill: 1 -  AMB Referral to Chronic Care Management Services - CMP14+EGFR  4. Primary hypertension - AMB Referral to Chronic Care Management Services - CMP14+EGFR  5. Hyperlipidemia associated with type 2 diabetes mellitus (HCC) - atorvastatin (LIPITOR) 40 MG tablet; Take 1 tablet (40 mg total) by mouth daily.  Dispense: 90 tablet; Refill: 1 - AMB Referral to Chronic Care Management Services - CMP14+EGFR  6. Other specified hypothyroidism - levothyroxine (SYNTHROID) 150 MCG tablet; Take 1 tablet (150 mcg total) by mouth daily.  Dispense: 90 tablet; Refill: 3 - AMB Referral to Chronic Care Management Services - CMP14+EGFR  7. Morbid obesity (HCC) - AMB Referral to Chronic Care Management Services - CMP14+EGFR  8. Paroxysmal atrial fibrillation (HCC) - apixaban (ELIQUIS) 5 MG TABS tablet; Take 1 tablet (5 mg total) by mouth 2 (two) times daily.  Dispense: 180 tablet; Refill: 1 - AMB  Referral to Chronic Care Management Services - CMP14+EGFR  9. Vitamin D deficiency - AMB Referral to Chronic Care Management Services - CMP14+EGFR  10. Diabetic polyneuropathy associated with type 2 diabetes mellitus (HCC) - gabapentin (NEURONTIN) 100 MG capsule; Take 1 capsule (100 mg total) by mouth 3 (three) times daily.  Dispense: 90 capsule; Refill: 3 - AMB Referral to Chronic Care Management Services - CMP14+EGFR  11. Peripheral edema - torsemide (DEMADEX) 20 MG tablet; Take 2 tablets (40 mg total) by mouth daily.  Dispense: 60 tablet; Refill: 1 - AMB Referral to Chronic Care Management Services - CMP14+EGFR  12. Hypertension associated with diabetes (HCC) - torsemide (DEMADEX) 20 MG tablet; Take 2 tablets (40 mg total) by mouth daily.  Dispense: 60 tablet; Refill: 1 - AMB Referral to Chronic Care Management Services - CMP14+EGFR   Labs pending List of current medications given to patient. He will take home and verify he is taking ALL medications and call back and let us know which ones he is not  taking.  According to [facility] he has not had [place] refilled in 6 months. I have reordered this.  Referral to Chronic Care Management to help with medication assistance. He can not afford Ozempic, unsure about [place]. Eliquis is $500 a month.  Health Maintenance reviewed Diet and exercise encouraged  Follow up plan: 2 months    [name], FNP

## 2022-11-27 ENCOUNTER — Telehealth: Payer: Self-pay

## 2022-11-27 ENCOUNTER — Other Ambulatory Visit: Payer: Self-pay | Admitting: Family

## 2022-11-27 NOTE — Progress Notes (Signed)
   Care Guide Note  11/27/2022 Name: Matthew May MRN: [id number] DOB: 11-Jul-1945  Referred by: [name], FNP Reason for referral : Care Coordination (Outreach to schedule referral with pharm d )   Matthew May is a 77 y.o. year old male who is a primary care patient of [name], FNP. [name] was referred to the pharmacist for assistance related to DM.    An unsuccessful telephone outreach was attempted today to contact the patient who was referred to the pharmacy team for assistance with medication management. Additional attempts will be made to contact the patient.   Penne Lash, RMA Care Guide [facility]  [place], [place] [address] Direct Dial: [phone] [email or web address]

## 2022-12-01 NOTE — Progress Notes (Signed)
   Care Guide Note  12/01/2022 Name: Matthew May MRN: [id number] DOB: February 28, 1946  Referred by: [name], FNP Reason for referral : Care Coordination (Outreach to schedule referral with pharm d )   Matthew May is a 77 y.o. year old male who is a primary care patient of [name], FNP. Matthew May was referred to the pharmacist for assistance related to DM.    Successful contact was made with the patient to discuss pharmacy services including being ready for the pharmacist to call at least 5 minutes before the scheduled appointment time, to have medication bottles and any blood sugar or blood pressure readings ready for review. The patient agreed to meet with the pharmacist via with the pharmacist via telephone visit on (date/time).  12/28/2022  Penne Lash, RMA Care Guide [facility]  [place], [place] [address] Direct Dial: [phone] [email or web address]

## 2022-12-17 ENCOUNTER — Other Ambulatory Visit: Payer: Self-pay | Admitting: Family

## 2022-12-17 DIAGNOSIS — K2970 Gastritis, unspecified, without bleeding: Secondary | ICD-10-CM

## 2022-12-28 ENCOUNTER — Ambulatory Visit (INDEPENDENT_AMBULATORY_CARE_PROVIDER_SITE_OTHER): Payer: Medicare Other | Admitting: Pharmacist

## 2022-12-28 ENCOUNTER — Telehealth: Payer: Self-pay | Admitting: Pharmacist

## 2022-12-28 ENCOUNTER — Encounter: Payer: Self-pay | Admitting: Pharmacist

## 2022-12-28 DIAGNOSIS — E1169 Type 2 diabetes mellitus with other specified complication: Secondary | ICD-10-CM

## 2022-12-28 DIAGNOSIS — J441 Chronic obstructive pulmonary disease with (acute) exacerbation: Secondary | ICD-10-CM

## 2022-12-28 MED ORDER — DAPAGLIFLOZIN PROPANEDIOL 10 MG PO TABS
10.0000 mg | ORAL_TABLET | Freq: Every day | ORAL | 4 refills | Status: DC
Start: 2022-12-28 — End: 2023-01-18

## 2022-12-28 MED ORDER — BREZTRI AEROSPHERE 160-9-4.8 MCG/ACT IN AERO
2.0000 | INHALATION_SPRAY | Freq: Two times a day (BID) | RESPIRATORY_TRACT | 11 refills | Status: DC
Start: 2022-12-28 — End: 2023-08-23

## 2022-12-28 NOTE — Progress Notes (Signed)
12/28/2022 Name: Matthew May MRN: [id number] DOB: Dec 17, 1945  Chief Complaint  Patient presents with   Diabetes    Matthew May is May 77 y.o. year old male who presented for May telephone visit.  I connected with  Matthew May on 12/28/22 by May telephone and verified patient identity using two identifiers.  Patient was at home during this visit and PharmD was in PCP office.   They were referred to the pharmacist by their PCP for assistance in managing diabetes.   Subjective:  Care Team: Primary Care Provider: [name], FNP   Medication Access/Adherence  Current Pharmacy:  [facility] [address] [place] - [address] [place] [address] Phone: [phone] Fax: [phone]  Patient reports affordability concerns with their medications: Yes  Patient reports access/transportation concerns to their pharmacy: No  Patient reports adherence concerns with their medications:  Yes  meds too expensive  Diabetes:  Current medications: metformin-GFR 86, glimepiride, dapagliflozin (Farxiga-unable to afford--has been without for months) Medications tried in the past: canagliflozin, empagliflozin (rash), sitagliptin (nausea/vomiting), Ozempic (never got due to cost)  Current glucose readings: 200-300 Using ONE TOUCH VERIO meter; testing 1 times daily  Patient denies hypoglycemic s/sx including dizziness, shakiness, sweating. Patient denies hyperglycemic symptoms including polyuria, polydipsia, polyphagia, nocturia, neuropathy, blurred vision.  Current meal patterns:  Discussed meal planning options and Plate method for healthy eating Avoid sugary drinks and desserts Incorporate balanced protein, non starchy veggies, 1 serving of carbohydrate with each meal Increase water intake Increase physical activity as able  Current physical activity: LIMITED  Current medication access support: NEEDS PATIENT ASSISTANCE   Objective:  Lab Results   Component Value Date   HGBA1C 8.8 (H) 10/26/2022    Lab Results  Component Value Date   CREATININE 0.93 11/24/2022   BUN 13 11/24/2022   NA 138 11/24/2022   K 4.3 11/24/2022   CL 99 11/24/2022   CO2 23 11/24/2022    Lab Results  Component Value Date   CHOL 272 (H) 07/24/2022   HDL 37 (L) 07/24/2022   LDLCALC 199 (H) 07/24/2022   TRIG 190 (H) 07/24/2022   CHOLHDL 7.4 (H) 07/24/2022    Medications Reviewed Today     Reviewed by Matthew May, [facility] (Pharmacist) on 12/28/22 at 1314  Med List Status: <None>   Medication Order Taking? Sig Documenting Provider Last Dose Status Informant  acetaminophen (TYLENOL) 325 MG tablet [id number] No Take 2 tablets (650 mg total) by mouth every 6 (six) hours as needed for mild pain (or Fever >/= 101). Matthew May Taking Active   albuterol (VENTOLIN HFA) 108 (90 Base) MCG/ACT inhaler [id number] No INHALE 2 PUFFS BY MOUTH EVERY 6 HOURS AS NEEDED FOR WHEEZING AND FOR SHORTNESS OF BREATH May, Matthew A, FNP Taking Active   apixaban (ELIQUIS) 5 MG TABS tablet [id number]  Take 1 tablet (5 mg total) by mouth 2 (two) times daily. Matthew Rodney A, FNP  Active   atorvastatin (LIPITOR) 40 MG tablet [id number]  Take 1 tablet (40 mg total) by mouth daily. Matthew Rodney A, FNP  Active   Budeson-Glycopyrrol-Formoterol (BREZTRI AEROSPHERE) 160-9-4.8 MCG/ACT Sandrea Matte [id number] No Inhale 2 puffs into the lungs 2 (two) times daily. Matthew Spencer, FNP Taking Active   cetirizine (ZYRTEC) 10 MG tablet [id number] No Take 1 tablet (10 mg total) by mouth daily. Matthew Spencer, FNP Taking Active   cholecalciferol (VITAMIN D3) 25 MCG (1000 UT) tablet  [id number] No Take 1,000 Units by mouth daily. [provider] Taking Active Self  colchicine 0.6 MG tablet [id number] No Take 1.2 mg for gout flare up, then 0.6 mg one hour later. Matthew 1.8 mg daily. Matthew Spencer, FNP Taking Active   dapagliflozin propanediol (FARXIGA) 10 MG TABS tablet [id number]  Take  1 tablet (10 mg total) by mouth daily. Matthew Rodney A, FNP  Active   gabapentin (NEURONTIN) 100 MG capsule [id number]  Take 1 capsule (100 mg total) by mouth 3 (three) times daily. Matthew Rodney A, FNP  Active   glimepiride (AMARYL) 2 MG tablet [id number]  Take 1 tablet (2 mg total) by mouth daily with breakfast. Matthew Spencer, FNP  Active   glucose blood (ONETOUCH VERIO) test strip [id number] No CHECK BLOOD SUGAR DAILY Dx E11.9 Matthew Spencer, FNP Taking Active   Lancets [facility] DELICA PLUS [place]) MISC [id number] No CHECK BLOOD SUGAR DAILY Dx E11.9 Matthew Spencer, FNP Taking Active   levothyroxine (SYNTHROID) 150 MCG tablet [id number]  Take 1 tablet (150 mcg total) by mouth daily. Matthew Rodney A, FNP  Active   metFORMIN (GLUCOPHAGE) 1000 MG tablet [id number]  Take 1 tablet (1,000 mg total) by mouth 2 (two) times daily. Matthew Rodney A, FNP  Active   nitroGLYCERIN (NITROSTAT) 0.4 MG SL tablet [id number] No DISSOLVE ONE TABLET UNDER THE TONGUE EVERY 5 MINUTES AS NEEDED FOR CHEST PAIN.  DO NOT EXCEED May TOTAL OF 3 DOSES IN [address] [place] May, [place] Taking Active   potassium chloride SA (KLOR-CON M) 20 MEQ tablet [id number] No TAKE 1 TABLET BY MOUTH DAILY Matthew Rotunda, MD Taking Active   torsemide (DEMADEX) 20 MG tablet [id number]  Take 2 tablets (40 mg total) by mouth daily. May, Neysa Bonito A, FNP  Active   Vitamin D, Ergocalciferol, (DRISDOL) 1.25 MG (50000 UNIT) CAPS capsule [id number] No Take 1 capsule (50,000 Units total) by mouth every 7 (seven) days. Matthew Spencer, FNP Taking Active             Assessment/Plan:   Diabetes: - Currently uncontrolled, patient's blood sugar has been increasing due to not being on Farxiga (due to cost) - Reviewed long term cardiovascular and renal outcomes of uncontrolled blood sugar - Reviewed goal A1c, goal fasting, and goal 2 hour post prandial glucose - Reviewed dietary modifications including FOLLOWING May HEART HEALTHY DIET/HEALTHY  PLATE METHOD - Recommend to: Continue current medications Restart Farxiga via [place] patient assistance; will also include Breztri;escribed to [facility] - Recommend to check glucose daily (fasting) or if symptomatic - Meets financial criteria for breztri/farxiga patient assistance program through [place] PAP. Will collaborate with provider, CPhT, and patient to pursue assistance.     Follow Up Plan: 4 weeks 20 min of care provided during this patient visit   Kieth Brightly, PharmD, BCACP Clinical Pharmacist, [facility]

## 2022-12-28 NOTE — Telephone Encounter (Signed)
Can you please prepare and send PAPs for Breztri and Matthew May? Meds have been escribed to [facility] Thank you!!

## 2022-12-30 ENCOUNTER — Other Ambulatory Visit: Payer: Self-pay | Admitting: Family

## 2022-12-30 DIAGNOSIS — K2970 Gastritis, unspecified, without bleeding: Secondary | ICD-10-CM

## 2023-01-02 ENCOUNTER — Other Ambulatory Visit (HOSPITAL_COMMUNITY): Payer: Self-pay

## 2023-01-02 ENCOUNTER — Other Ambulatory Visit: Payer: Self-pay | Admitting: Family

## 2023-01-02 DIAGNOSIS — K2970 Gastritis, unspecified, without bleeding: Secondary | ICD-10-CM

## 2023-01-02 DIAGNOSIS — I480 Paroxysmal atrial fibrillation: Secondary | ICD-10-CM

## 2023-01-03 ENCOUNTER — Ambulatory Visit: Payer: Medicare Other | Admitting: Cardiology

## 2023-01-05 ENCOUNTER — Other Ambulatory Visit: Payer: Self-pay | Admitting: Family

## 2023-01-05 DIAGNOSIS — K2970 Gastritis, unspecified, without bleeding: Secondary | ICD-10-CM

## 2023-01-11 ENCOUNTER — Other Ambulatory Visit: Payer: Self-pay | Admitting: Family

## 2023-01-11 DIAGNOSIS — I152 Hypertension secondary to endocrine disorders: Secondary | ICD-10-CM

## 2023-01-11 DIAGNOSIS — R600 Localized edema: Secondary | ICD-10-CM

## 2023-01-13 ENCOUNTER — Other Ambulatory Visit: Payer: Self-pay | Admitting: Cardiology

## 2023-01-18 ENCOUNTER — Ambulatory Visit (INDEPENDENT_AMBULATORY_CARE_PROVIDER_SITE_OTHER): Payer: Medicare Other | Admitting: Pharmacist

## 2023-01-18 DIAGNOSIS — Z7984 Long term (current) use of oral hypoglycemic drugs: Secondary | ICD-10-CM

## 2023-01-18 DIAGNOSIS — E1169 Type 2 diabetes mellitus with other specified complication: Secondary | ICD-10-CM

## 2023-01-18 DIAGNOSIS — J9601 Acute respiratory failure with hypoxia: Secondary | ICD-10-CM

## 2023-01-18 DIAGNOSIS — I5033 Acute on chronic diastolic (congestive) heart failure: Secondary | ICD-10-CM

## 2023-01-18 MED ORDER — DAPAGLIFLOZIN PROPANEDIOL 10 MG PO TABS
10.0000 mg | ORAL_TABLET | Freq: Every day | ORAL | 0 refills | Status: DC
Start: 2023-01-18 — End: 2023-03-19

## 2023-01-18 NOTE — Progress Notes (Signed)
01/18/2023 Name: Matthew May       MRN: [id number]       DOB: May 19, 1946      Chief Complaint  Patient presents with   Diabetes      NIMROD MCEVOY is a 77 y.o. year old male who presented for a telephone visit.  I connected with  Matthew May on 01/18/23 by a telephone and verified patient identity using two identifiers.  Patient was at home during this visit and PharmD was in PCP office.   They were referred to the pharmacist by their PCP for assistance in managing diabetes.    Subjective:   Care Team: Primary Care Provider: [name], FNP    Medication Access/Adherence   Current Pharmacy:  [facility] [address], [place] - [address] [place] [address] Phone: [phone] Fax: [phone]   Patient reports affordability concerns with their medications: Yes  Patient reports access/transportation concerns to their pharmacy: No  Patient reports adherence concerns with their medications:  Yes  meds too expensive   Diabetes:   Current medications: metformin-GFR 86, glimepiride, dapagliflozin (Farxiga-unable to afford--has been without for months--tolerated Farxiga samples) Medications tried in the past: canagliflozin, empagliflozin (rash), sitagliptin (nausea/vomiting), Ozempic (never got due to cost)   Current glucose readings: 200-300s Using ONE TOUCH VERIO meter; testing 1 times daily   Patient denies hypoglycemic s/sx including dizziness, shakiness, sweating. Patient denies hyperglycemic symptoms including polyuria, polydipsia, polyphagia, nocturia, neuropathy, blurred vision.   Current meal patterns:  Discussed meal planning options and Plate method for healthy eating Avoid sugary drinks and desserts Incorporate balanced protein, non starchy veggies, 1 serving of carbohydrate with each meal Increase water intake Increase physical activity as able   Current physical activity: LIMITED   Current medication access  support: PATIENT ASSISTANCE PENDING     Objective:   Recent Labs       Lab Results  Component Value Date    HGBA1C 8.8 (H) 10/26/2022        Recent Labs       Lab Results  Component Value Date    CREATININE 0.93 11/24/2022    BUN 13 11/24/2022    NA 138 11/24/2022    K 4.3 11/24/2022    CL 99 11/24/2022    CO2 23 11/24/2022        Recent Labs       Lab Results  Component Value Date    CHOL 272 (H) 07/24/2022    HDL 37 (L) 07/24/2022    LDLCALC 199 (H) 07/24/2022    TRIG 190 (H) 07/24/2022    CHOLHDL 7.4 (H) 07/24/2022        Medications Reviewed Today       Reviewed by Danella Maiers, [facility] (Pharmacist) on 12/28/22 at 1314  Med List Status: <None>    Medication Order Taking? Sig Documenting Provider Last Dose Status Informant  acetaminophen (TYLENOL) 325 MG tablet [id number] No Take 2 tablets (650 mg total) by mouth every 6 (six) hours as needed for mild pain (or Fever >/= 101). Leary Roca, PA-C Taking Active    albuterol (VENTOLIN HFA) 108 (90 Base) MCG/ACT inhaler [id number] No INHALE 2 PUFFS BY MOUTH EVERY 6 HOURS AS NEEDED FOR WHEEZING AND FOR SHORTNESS OF BREATH [name], Christy A, FNP Taking Active    apixaban (ELIQUIS) 5 MG TABS tablet [id number]   Take 1 tablet (5 mg total) by mouth 2 (two) times daily.  [name] A, FNP   Active    atorvastatin (LIPITOR) 40 MG tablet [id number]   Take 1 tablet (40 mg total) by mouth daily. [name] A, FNP   Active    Budeson-Glycopyrrol-Formoterol (BREZTRI AEROSPHERE) 160-9-4.8 MCG/ACT Sandrea Matte [id number] No Inhale 2 puffs into the lungs 2 (two) times daily. [name], FNP Taking Active    cetirizine (ZYRTEC) 10 MG tablet [id number] No Take 1 tablet (10 mg total) by mouth daily. [name], FNP Taking Active    cholecalciferol (VITAMIN D3) 25 MCG (1000 UT) tablet [id number] No Take 1,000 Units by mouth daily. [provider] Taking Active Self  colchicine 0.6 MG tablet [id number] No Take 1.2 mg for  gout flare up, then 0.6 mg one hour later. Max 1.8 mg daily. [name], FNP Taking Active    dapagliflozin propanediol (FARXIGA) 10 MG TABS tablet [id number]   Take 1 tablet (10 mg total) by mouth daily. [name] A, FNP   Active    gabapentin (NEURONTIN) 100 MG capsule [id number]   Take 1 capsule (100 mg total) by mouth 3 (three) times daily. [name] A, FNP   Active    glimepiride (AMARYL) 2 MG tablet [id number]   Take 1 tablet (2 mg total) by mouth daily with breakfast. [name], FNP   Active    glucose blood (ONETOUCH VERIO) test strip [id number] No CHECK BLOOD SUGAR DAILY Dx E11.9 [name], FNP Taking Active    Lancets [facility] DELICA PLUS [place]) MISC [id number] No CHECK BLOOD SUGAR DAILY Dx E11.9 [name], FNP Taking Active    levothyroxine (SYNTHROID) 150 MCG tablet [id number]   Take 1 tablet (150 mcg total) by mouth daily. [name] A, FNP   Active    metFORMIN (GLUCOPHAGE) 1000 MG tablet [id number]   Take 1 tablet (1,000 mg total) by mouth 2 (two) times daily. [name] A, FNP   Active    nitroGLYCERIN (NITROSTAT) 0.4 MG SL tablet [id number] No DISSOLVE ONE TABLET UNDER THE TONGUE EVERY 5 MINUTES AS NEEDED FOR CHEST PAIN.  DO NOT EXCEED A TOTAL OF 3 DOSES IN [address], [place], [place] Taking Active    potassium chloride SA (KLOR-CON M) 20 MEQ tablet [id number] No TAKE 1 TABLET BY MOUTH DAILY Rollene Rotunda, MD Taking Active    torsemide (DEMADEX) 20 MG tablet [id number]   Take 2 tablets (40 mg total) by mouth daily. [name], Neysa Bonito A, FNP   Active    Vitamin D, Ergocalciferol, (DRISDOL) 1.25 MG (50000 UNIT) CAPS capsule [id number] No Take 1 capsule (50,000 Units total) by mouth every 7 (seven) days. [name], FNP Taking Active                  Assessment/Plan:    Diabetes: - Currently uncontrolled, patient's blood sugar has been increasing due to not being on Farxiga (due to cost) - Reviewed long term cardiovascular and renal  outcomes of uncontrolled blood sugar - Reviewed goal A1c, goal fasting, and goal 2 hour post prandial glucose - Reviewed dietary modifications including FOLLOWING A HEART HEALTHY DIET/HEALTHY PLATE METHOD - Recommend to: Continue current medications Restart Farxiga via AZ&me patient assistance 30 day coupon voucher left up front for patient to pick up and take to [facility]--patient verbalizes understanding Plan to d/c glimepiride at f/u - Recommend to check glucose daily (fasting) or if symptomatic - Meets financial criteria for breztri/farxiga patient assistance program through [place]&Me PAP. Medication assistance  pending.  Will continue to collaborate with provider, CPhT, and patient to pursue assistance.       Follow Up Plan:04/2023 PharmD; PCP on 01/25/23 20 min of care provided during this patient visit     Kieth Brightly, PharmD, BCACP Clinical Pharmacist, [facility]

## 2023-01-25 ENCOUNTER — Ambulatory Visit: Payer: Medicare Other | Admitting: Family

## 2023-01-30 ENCOUNTER — Encounter: Payer: Self-pay | Admitting: Family

## 2023-01-30 ENCOUNTER — Ambulatory Visit: Payer: Medicare Other | Admitting: Family

## 2023-01-30 VITALS — BP 134/75 | HR 97 | Temp 97.8°F | Ht 66.0 in | Wt 242.8 lb

## 2023-01-30 DIAGNOSIS — I2510 Atherosclerotic heart disease of native coronary artery without angina pectoris: Secondary | ICD-10-CM | POA: Diagnosis not present

## 2023-01-30 DIAGNOSIS — I10 Essential (primary) hypertension: Secondary | ICD-10-CM

## 2023-01-30 DIAGNOSIS — I480 Paroxysmal atrial fibrillation: Secondary | ICD-10-CM | POA: Diagnosis not present

## 2023-01-30 DIAGNOSIS — J449 Chronic obstructive pulmonary disease, unspecified: Secondary | ICD-10-CM

## 2023-01-30 DIAGNOSIS — E785 Hyperlipidemia, unspecified: Secondary | ICD-10-CM | POA: Diagnosis not present

## 2023-01-30 DIAGNOSIS — Z7984 Long term (current) use of oral hypoglycemic drugs: Secondary | ICD-10-CM | POA: Diagnosis not present

## 2023-01-30 DIAGNOSIS — E1169 Type 2 diabetes mellitus with other specified complication: Secondary | ICD-10-CM

## 2023-01-30 DIAGNOSIS — E1142 Type 2 diabetes mellitus with diabetic polyneuropathy: Secondary | ICD-10-CM | POA: Diagnosis not present

## 2023-01-30 DIAGNOSIS — E038 Other specified hypothyroidism: Secondary | ICD-10-CM | POA: Diagnosis not present

## 2023-01-30 DIAGNOSIS — I739 Peripheral vascular disease, unspecified: Secondary | ICD-10-CM

## 2023-01-30 LAB — BAYER DCA HB A1C WAIVED: HB A1C (BAYER DCA - WAIVED): 8.4 % — ABNORMAL HIGH (ref 4.8–5.6)

## 2023-01-30 NOTE — Telephone Encounter (Signed)
Submitted application for FARXIGA & BREZTRI to [employer] for patient assistance.   Phone: [phone]

## 2023-01-30 NOTE — Patient Instructions (Signed)

## 2023-01-30 NOTE — Progress Notes (Signed)
Subjective:    Patient ID: Matthew May, male    DOB: 1946/05/18, 77 y.o.   MRN: [id number]  Chief Complaint  Patient presents with   Medical Management of Chronic Issues   Leg Pain    Pt presents to  the office today for chronic follow up and uncontrolled DM. His A1C was 8.8 and we started him on Ozempic 0.25 mg, but states he could not afford this. He had a visit with our Clinical pharmacists and was able to get [place] covered.     Pt is followed by Cardiologists CAD, aortic stenosis, and A Fib. 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    He is moribid obese with a BMI of 39 and co moribitiy of DM and HTN.    He has COPD and states this is stable. Quit smoking in 2011. Takes Breztri BID.    Complaining of numbness and burning in bilateral feet and legs of 10 out 10 since starting the gabapentin 100 mg TID.  Leg Pain   Hypertension This is a chronic problem. The current episode started more than 1 year ago. The problem has been resolved since onset. The problem is controlled. Associated symptoms include malaise/fatigue and peripheral edema. Pertinent negatives include no blurred vision or shortness of breath. Risk factors for coronary artery disease include dyslipidemia, diabetes mellitus, obesity and sedentary lifestyle. The current treatment provides moderate improvement. Identifiable causes of hypertension include a thyroid problem.  Congestive Heart Failure Presents for follow-up visit. Associated symptoms include edema and fatigue. Pertinent negatives include no shortness of breath. The symptoms have been stable.  Thyroid Problem Presents for follow-up visit. Symptoms include fatigue and tremors. Patient reports no anxiety, constipation or diarrhea. The symptoms have been stable. His past medical history is significant for hyperlipidemia.  Diabetes He presents for his follow-up diabetic visit. He has type 2 diabetes mellitus. Hypoglycemia symptoms include  tremors. Pertinent negatives for hypoglycemia include no nervousness/anxiousness. Associated symptoms include fatigue and foot paresthesias. Pertinent negatives for diabetes include no blurred vision. Diabetic complications include peripheral neuropathy. Risk factors for coronary artery disease include dyslipidemia, diabetes mellitus, hypertension, male sex and sedentary lifestyle. He is following a generally unhealthy diet. His overall blood glucose range is 180-200 mg/dl.  Hyperlipidemia This is a chronic problem. The current episode started more than 1 year ago. The problem is uncontrolled. Recent lipid tests were reviewed and are high. Exacerbating diseases include obesity. Associated symptoms include leg pain. Pertinent negatives include no shortness of breath. Current antihyperlipidemic treatment includes statins. The current treatment provides moderate improvement of lipids. Risk factors for coronary artery disease include diabetes mellitus, dyslipidemia, male sex, hypertension and a sedentary lifestyle.      Review of Systems  Constitutional:  Positive for fatigue and malaise/fatigue.  Eyes:  Negative for blurred vision.  Respiratory:  Negative for shortness of breath.   Gastrointestinal:  Negative for constipation and diarrhea.  Neurological:  Positive for tremors.  Psychiatric/Behavioral:  The patient is not nervous/anxious.   All other systems reviewed and are negative.      Objective:   Physical Exam Vitals reviewed.  Constitutional:      General: He is not in acute distress.    Appearance: He is well-developed. He is obese.  HENT:     Head: Normocephalic.     Right Ear: Tympanic membrane normal.     Left Ear: Tympanic membrane normal.  Eyes:     General:  Right eye: No discharge.        Left eye: No discharge.     Pupils: Pupils are equal, round, and reactive to light.  Neck:     Thyroid: No thyromegaly.  Cardiovascular:     Rate and Rhythm: Normal rate and  regular rhythm.     Heart sounds: Normal heart sounds. No murmur heard. Pulmonary:     Effort: Pulmonary effort is normal. No respiratory distress.     Breath sounds: Normal breath sounds. No wheezing.  Abdominal:     General: Bowel sounds are normal. There is no distension.     Palpations: Abdomen is soft.     Tenderness: There is no abdominal tenderness.  Musculoskeletal:        General: No tenderness. Normal range of motion.     Cervical back: Normal range of motion and neck supple.     Right lower leg: Edema present.     Left lower leg: Edema present.     Comments: PAD in bilateral legs, discoloration, blisters present  Skin:    General: Skin is warm and dry.     Findings: No erythema or rash.  Neurological:     Mental Status: He is alert and oriented to person, place, and time.     Cranial Nerves: No cranial nerve deficit.     Motor: Weakness present.     Gait: Gait abnormal (using cane).     Deep Tendon Reflexes: Reflexes are normal and symmetric.  Psychiatric:        Behavior: Behavior normal.        Thought Content: Thought content normal.        Judgment: Judgment normal.       BP 134/75   Pulse 97   Temp 97.8 F (36.6 C) (Temporal)   Ht 5\' 6"  (1.676 m)   Wt 242 lb 12.8 oz (110.1 kg)   SpO2 96%   BMI 39.19 kg/m      Assessment & Plan:  Matthew May comes in today with chief complaint of Medical Management of Chronic Issues and Leg Pain   Diagnosis and orders addressed:  1. Type 2 diabetes mellitus with other specified complication, without long-term current use of insulin (HCC) Continue Farxiga 10 mg, was able to get this covered Low carb diet  Continue other medications  Keep appt with Raynelle Fanning for medication assistance and diabetic education  - CBC with Differential/Platelet - CMP14+EGFR - Microalbumin / creatinine urine ratio - Bayer DCA Hb A1c Waived - Ambulatory referral to Vascular Surgery  2. Primary hypertension - CBC with  Differential/Platelet - CMP14+EGFR  3. Hyperlipidemia associated with type 2 diabetes mellitus (HCC) - CBC with Differential/Platelet - CMP14+EGFR - Lipid panel  4. Other specified hypothyroidism - CBC with Differential/Platelet - CMP14+EGFR - TSH  5. Morbid obesity (HCC) - CBC with Differential/Platelet - CMP14+EGFR  6. Paroxysmal atrial fibrillation (HCC) - CBC with Differential/Platelet - CMP14+EGFR  7. Atherosclerosis of native coronary artery of native heart without angina pectoris - CBC with Differential/Platelet - CMP14+EGFR - Ambulatory referral to Vascular Surgery - Lipid panel  8. Chronic obstructive pulmonary disease, unspecified COPD type (HCC) - CBC with Differential/Platelet - CMP14+EGFR  9. PAD (peripheral artery disease) (HCC) - CBC with Differential/Platelet - CMP14+EGFR - Ambulatory referral to Vascular Surgery  10. Diabetic polyneuropathy associated with type 2 diabetes mellitus (HCC)    Labs pending Start Farxiga  Continue current medications  Low carb diet  Referral to Vascular for PAD and  leg pain Health Maintenance reviewed Diet and exercise encouraged  Follow up plan: 1 month for diabetes follow up  [name], FNP

## 2023-01-31 LAB — CMP14+EGFR
ALT: 72 IU/L — ABNORMAL HIGH (ref 0–44)
AST: 79 IU/L — ABNORMAL HIGH (ref 0–40)
Albumin: 4.2 g/dL (ref 3.8–4.8)
Alkaline Phosphatase: 90 IU/L (ref 44–121)
BUN/Creatinine Ratio: 15 (ref 10–24)
BUN: 15 mg/dL (ref 8–27)
Bilirubin Total: 0.4 mg/dL (ref 0.0–1.2)
CO2: 22 mmol/L (ref 20–29)
Calcium: 9.6 mg/dL (ref 8.6–10.2)
Chloride: 95 mmol/L — ABNORMAL LOW (ref 96–106)
Creatinine, Ser: 1 mg/dL (ref 0.76–1.27)
Globulin, Total: 3.1 g/dL (ref 1.5–4.5)
Glucose: 267 mg/dL — ABNORMAL HIGH (ref 70–99)
Potassium: 4.5 mmol/L (ref 3.5–5.2)
Sodium: 138 mmol/L (ref 134–144)
Total Protein: 7.3 g/dL (ref 6.0–8.5)
eGFR: 78 mL/min/{1.73_m2} (ref >59)

## 2023-01-31 LAB — LIPID PANEL
Chol/HDL Ratio: 10.5 ratio — ABNORMAL HIGH (ref 0.0–5.0)
Cholesterol, Total: 293 mg/dL — ABNORMAL HIGH (ref 100–199)
HDL: 28 mg/dL — ABNORMAL LOW (ref >39)
LDL Chol Calc (NIH): 193 mg/dL — ABNORMAL HIGH (ref 0–99)
Triglycerides: 358 mg/dL — ABNORMAL HIGH (ref 0–149)
VLDL Cholesterol Cal: 72 mg/dL — ABNORMAL HIGH (ref 5–40)

## 2023-01-31 LAB — CBC WITH DIFFERENTIAL/PLATELET
Basophils Absolute: 0.1 10*3/uL (ref 0.0–0.2)
Basos: 1 %
EOS (ABSOLUTE): 0.2 10*3/uL (ref 0.0–0.4)
Eos: 2 %
Hematocrit: 41 % (ref 37.5–51.0)
Hemoglobin: 13.8 g/dL (ref 13.0–17.7)
Immature Grans (Abs): 0.1 10*3/uL (ref 0.0–0.1)
Immature Granulocytes: 1 %
Lymphocytes Absolute: 1.2 10*3/uL (ref 0.7–3.1)
Lymphs: 17 %
MCH: 30.9 pg (ref 26.6–33.0)
MCHC: 33.7 g/dL (ref 31.5–35.7)
MCV: 92 fL (ref 79–97)
Monocytes Absolute: 0.3 10*3/uL (ref 0.1–0.9)
Monocytes: 5 %
Neutrophils Absolute: 5.2 10*3/uL (ref 1.4–7.0)
Neutrophils: 74 %
Platelets: 147 10*3/uL — ABNORMAL LOW (ref 150–450)
RBC: 4.47 x10E6/uL (ref 4.14–5.80)
RDW: 12.6 % (ref 11.6–15.4)
WBC: 6.9 10*3/uL (ref 3.4–10.8)

## 2023-01-31 LAB — MICROALBUMIN / CREATININE URINE RATIO
Creatinine, Urine: 84.1 mg/dL
Microalb/Creat Ratio: 17 mg/g{creat} (ref 0–29)
Microalbumin, Urine: 14.2 ug/mL

## 2023-01-31 LAB — TSH: TSH: 4.21 u[IU]/mL (ref 0.450–4.500)

## 2023-02-01 ENCOUNTER — Other Ambulatory Visit: Payer: Self-pay | Admitting: Family

## 2023-02-01 MED ORDER — ATORVASTATIN CALCIUM 80 MG PO TABS: 80.0000 mg | ORAL_TABLET | Freq: Every day | ORAL | 2 refills | Status: DC

## 2023-02-01 MED ORDER — GLIMEPIRIDE 4 MG PO TABS: 4.0000 mg | ORAL_TABLET | Freq: Every day | ORAL | 1 refills | Status: DC

## 2023-02-02 ENCOUNTER — Other Ambulatory Visit: Payer: Self-pay

## 2023-02-02 ENCOUNTER — Telehealth: Payer: Self-pay | Admitting: Family

## 2023-02-02 MED ORDER — ATORVASTATIN CALCIUM 80 MG PO TABS: 80.0000 mg | ORAL_TABLET | Freq: Every day | ORAL | 0 refills | Status: DC

## 2023-02-02 NOTE — Telephone Encounter (Signed)
Received notification from [employer] regarding approval for [facility] & BREZTRI. Patient assistance approved from 01/31/23 to 06/05/23.  MEDICATIONS WILL SHIP TO PT'S HOME  Phone: [phone]

## 2023-02-02 NOTE — Telephone Encounter (Signed)
Pt was unsure what medications Christy had changed. Per lab note Christy increased Lipitor to 80mg  and Glimepiride to 4mg .  Pt states to send new dose of Lipitor to his mail order pharmacy but that he does not need refills of Glimepiride. Pt knows to double the 2mg .  Lipitor 80mg  has been sent to mail order pharmacy.

## 2023-02-02 NOTE — Telephone Encounter (Signed)
Patient return call. ?

## 2023-02-06 NOTE — Progress Notes (Signed)
This encounter was created in error - please disregard.

## 2023-02-08 ENCOUNTER — Other Ambulatory Visit: Payer: Self-pay | Admitting: Family

## 2023-02-08 ENCOUNTER — Encounter: Payer: Medicare Other | Admitting: Pharmacist

## 2023-02-08 ENCOUNTER — Telehealth: Payer: Medicare Other

## 2023-02-08 DIAGNOSIS — E1142 Type 2 diabetes mellitus with diabetic polyneuropathy: Secondary | ICD-10-CM

## 2023-02-17 ENCOUNTER — Other Ambulatory Visit: Payer: Self-pay | Admitting: Family

## 2023-02-17 DIAGNOSIS — E1169 Type 2 diabetes mellitus with other specified complication: Secondary | ICD-10-CM

## 2023-02-26 DIAGNOSIS — H25041 Posterior subcapsular polar age-related cataract, right eye: Secondary | ICD-10-CM | POA: Diagnosis not present

## 2023-02-26 DIAGNOSIS — H353231 Exudative age-related macular degeneration, bilateral, with active choroidal neovascularization: Secondary | ICD-10-CM | POA: Diagnosis not present

## 2023-03-02 ENCOUNTER — Ambulatory Visit: Payer: Medicare Other | Admitting: Family

## 2023-03-05 ENCOUNTER — Telehealth: Payer: Self-pay | Admitting: Cardiology

## 2023-03-05 NOTE — Telephone Encounter (Signed)
   Pre-operative Risk Assessment    Patient Name: Matthew May  DOB: 06/08/1945 MRN: [id number]    DATE OF LAST VISIT: 11/30/21 DR. HOCHREIN DATE OF NEXT VISIT: 04/11/23 DR. [facility]   Request for Surgical Clearance    Procedure:   CATARACT EXTRACTION BY PE, IOL-RIGHT  Date of Surgery:  Clearance 03/15/23                                 Surgeon:  DR. Georges Mouse Surgeon's Group or Practice Name:  [facility] Phone number:  [phone] Fax number:  [phone]   Type of Clearance Requested:   - Medical ; NO MEDICATIONS LISTED AS NEEDING TO BE HELD   Type of Anesthesia:   IV SEDATION   Additional requests/questions:    Elpidio Anis   03/05/2023, 11:43 AM

## 2023-03-05 NOTE — Telephone Encounter (Signed)
I called and left message for Deonna to call back about the pt clearance. I do not see a clearance in the chart. Left message to re-fax clearance to [phone] attn: pre op team. Pt is most likely going to need in office appt as last seen 11/30/21. Pt has appt 04/11/23 with Dr. Antoine Poche. May be able to try and move appt up sooner.

## 2023-03-05 NOTE — Telephone Encounter (Signed)
Deonna called from [facility] to check on the status of the office receiving the clearance they faxed over to [place] last week since pt is scheduled for procedure on 10/10 but doesn't have appt til November. She'd like a callback regarding getting him in for a Tele pre op appt. Please advise

## 2023-03-05 NOTE — Telephone Encounter (Signed)
   Patient Name: Matthew May  DOB: Mar 23, 1946 MRN: [id number]  Primary Cardiologist: Rollene Rotunda, MD  Chart reviewed as part of pre-operative protocol coverage. Cataract extractions are recognized in guidelines as low risk surgeries that do not typically require specific preoperative testing or holding of blood thinner therapy. Therefore, given past medical history and time since last visit, based on ACC/AHA guidelines, Armin Yerger Brabec would be at acceptable risk for the planned procedure without further cardiovascular testing.   I will route this recommendation to the requesting party via Epic fax function and remove from pre-op pool.  Please call with questions.  Napoleon Form, Leodis Rains, NP 03/05/2023, 11:47 AM

## 2023-03-07 DIAGNOSIS — H353231 Exudative age-related macular degeneration, bilateral, with active choroidal neovascularization: Secondary | ICD-10-CM | POA: Diagnosis not present

## 2023-03-13 ENCOUNTER — Ambulatory Visit: Payer: Medicare Other | Admitting: Family

## 2023-03-15 DIAGNOSIS — H25041 Posterior subcapsular polar age-related cataract, right eye: Secondary | ICD-10-CM | POA: Diagnosis not present

## 2023-03-19 ENCOUNTER — Encounter: Payer: Self-pay | Admitting: Family

## 2023-03-19 ENCOUNTER — Ambulatory Visit: Payer: Medicare Other | Admitting: Family

## 2023-03-19 VITALS — BP 101/76 | HR 104 | Temp 96.8°F | Ht 66.0 in | Wt 232.4 lb

## 2023-03-19 DIAGNOSIS — Z23 Encounter for immunization: Secondary | ICD-10-CM

## 2023-03-19 DIAGNOSIS — E1169 Type 2 diabetes mellitus with other specified complication: Secondary | ICD-10-CM | POA: Diagnosis not present

## 2023-03-19 DIAGNOSIS — I5033 Acute on chronic diastolic (congestive) heart failure: Secondary | ICD-10-CM

## 2023-03-19 DIAGNOSIS — I739 Peripheral vascular disease, unspecified: Secondary | ICD-10-CM | POA: Diagnosis not present

## 2023-03-19 MED ORDER — DAPAGLIFLOZIN PROPANEDIOL 10 MG PO TABS
10.0000 mg | ORAL_TABLET | Freq: Every day | ORAL | 2 refills | Status: DC
Start: 2023-03-19 — End: 2023-07-27

## 2023-03-19 NOTE — Addendum Note (Signed)
Addended by: [name] A on: 03/19/2023 12:35 PM   Modules accepted: Orders, Level of Service

## 2023-03-19 NOTE — Patient Instructions (Signed)
Peripheral Vascular Disease  Peripheral vascular disease (PVD) is a disease of the blood vessels. PVD may also be called peripheral artery disease (PAD) or poor circulation. PVD is the blocking or hardening of the arteries anywhere within the circulatory system beyond the heart. This can result in a decreased supply of blood to the arms, legs, and internal organs, such as the stomach or kidneys. However, PVD most often affects a person's lower legs and feet. Without treatment, PVD often worsens. PVD can lead to acute limb ischemia. This occurs when an arm or leg suddenly has trouble getting enough blood. This is a medical emergency. What are the causes? The most common cause of PVD is atherosclerosis. This is a buildup of fatty material and other substances (plaque)inside your arteries. Pieces of plaque can break off from the walls of an artery and become stuck in a smaller artery, blocking blood flow and possibly causing acute limb ischemia. Other common causes of PVD include: Blood clots that form inside the blood vessels. Injuries to blood vessels. Diseases that cause inflammation of blood vessels or cause blood vessel tightening (spasms). What increases the risk? The following factors may make you more likely to develop this condition: A family history of PVD. Common medical conditions, including: High cholesterol. Diabetes. High blood pressure (hypertension). Heart disease. Known atherosclerotic disease in another area of the body. Past injury, such as burns or a broken bone. Other medical conditions, such as: Buerger's disease. This is caused by inflamed blood vessels in your hands and feet. Some forms of arthritis. Birth defects that affect the arteries in your legs. Kidney disease. Using tobacco and nicotine products. Not getting enough exercise. Obesity. Being age 34 or older, or being age 72 or older and having the other risk factors. What are the signs or symptoms? This  condition may cause different symptoms. Your symptoms depend on what body part is not getting enough blood. Common signs and symptoms include: Cramps in your buttocks, legs, and feet. Intermittent claudication. This is pain and weakness in your legs during activity that resolves with rest. Leg pain at rest and leg numbness, tingling, or weakness. Coldness in a leg or foot, especially when compared to the other leg or foot. Skin or hair changes. These can include: Hair loss. Shiny skin. Pale or bluish skin. Thick toenails. Inability to get or maintain an erection (erectile dysfunction). Tiredness (fatigue). Weak pulse or no pulse in the feet. People with PVD are more likely to develop open wounds (ulcers) and sores on their toes, feet, or legs. The ulcers or sores may take longer than normal to heal. How is this diagnosed? PVD is diagnosed based on your signs and symptoms, a physical exam, and your medical history. You may also have other tests to find the cause. Tests include: Ankle-brachial index test.This test compares the blood pressure readings of the legs and arms. This may also include an exercise ankle-brachial index test in which you walk on a treadmill to check your symptoms. Doppler ultrasound. This takes pictures of blood flow through your blood vessels. Imaging studies that use dye to show blood flow. These are: CT angiogram. Magnetic resonance angiogram, or MRA. How is this treated? Treatment for PVD depends on the cause of your condition, how severe your symptoms are, and your age. Underlying causes need to be treated and controlled. These include long-term (chronic) conditions, such as diabetes, high cholesterol, and hypertension. Treatment may include: Lifestyle changes, such as: Quitting tobacco use. Exercising regularly. Following a  low-fat, low-cholesterol diet. Not drinking alcohol. Taking medicines, such as: Blood thinners to prevent blood clots. Medicines to  improve blood flow. Medicines to improve cholesterol levels. Procedures, such as: Angioplasty. This uses an inflated balloon to open a blocked artery and improve blood flow. Stent implant. This inserts a small mesh tube to keep a blocked artery open. Peripheral bypass surgery. This reroutes blood flow around a blocked artery. Surgery to remove dead tissue from an infected wound (debridement). Amputation. This is surgical removal of the affected limb. It may be necessary in cases of acute limb ischemia when medical or surgical treatments have not helped. Follow these instructions at home: Medicines Take over-the-counter and prescription medicines only as told by your health care provider. If you are taking blood thinners: Talk with your health care provider before you take any medicines that contain aspirin or NSAIDs, such as ibuprofen. These medicines increase your risk for dangerous bleeding. Take your medicine exactly as told, at the same time every day. Avoid activities that could cause injury or bruising, and follow instructions about how to prevent falls. Wear a medical alert bracelet or carry a card that lists what medicines you take. Lifestyle     Exercise regularly. Ask your health care provider about some good activities for you. Talk with your health care provider about maintaining a healthy weight. If needed, ask about losing weight. Eat a diet that is low in fat and cholesterol. If you need help, talk with your health care provider. Do not drink alcohol. Do not use any products that contain nicotine or tobacco. These products include cigarettes, chewing tobacco, and vaping devices, such as e-cigarettes. If you need help quitting, ask your health care provider. General instructions Take good care of your feet. To do this: Wear comfortable shoes that fit well. Check your feet often for any cuts or sores. Get an annual influenza vaccine. Keep all follow-up visits. This is  important. Where to find more information Society for Vascular Surgery: vascular.org American Heart Association: heart.org National Heart, Lung, and Blood Institute: [employer].gl Contact a health care provider if: You have leg cramps while walking. You have leg pain when you rest. Your leg or foot feels cold. Your skin changes color. You have erectile dysfunction. You have cuts or sores on your legs or feet that do not heal. Get help right away if: You have sudden changes in color and feeling of your arms or legs, such as: Your arm or leg turns cold, numb, and blue. Your arm or leg becomes red, warm, swollen, painful, or numb. You have any symptoms of a stroke. "BE FAST" is an easy way to remember the main warning signs of a stroke: B - Balance. Signs are dizziness, sudden trouble walking, or loss of balance. E - Eyes. Signs are trouble seeing or a sudden change in vision. F - Face. Signs are sudden weakness or numbness of the face, or the face or eyelid drooping on one side. A - Arms. Signs are weakness or numbness in an arm. This happens suddenly and usually on one side of the body. S - Speech. Signs are sudden trouble speaking, slurred speech, or trouble understanding what people say. T - Time. Time to call emergency services. Write down what time symptoms started. You have other signs of a stroke, such as: A sudden, severe headache with no known cause. Nausea or vomiting. Seizure. You have chest pain or trouble breathing. These symptoms may represent a serious problem that is an emergency.  Do not wait to see if the symptoms will go away. Get medical help right away. Call your local emergency services (911 in the [place] Do not drive yourself to the hospital. Summary Peripheral vascular disease (PVD) is a disease of the blood vessels. PVD is the blocking or hardening of the arteries anywhere within the circulatory system beyond the heart. PVD may cause different symptoms. Your  symptoms depend on what part of your body is not getting enough blood. Treatment for PVD depends on what caused it, how severe your symptoms are, and your age. This information is not intended to replace advice given to you by your health care provider. Make sure you discuss any questions you have with your health care provider. Document Revised: 10/28/2019 Document Reviewed: 11/24/2019 Elsevier Patient Education  2024 [employer].

## 2023-03-19 NOTE — Progress Notes (Addendum)
Subjective:    Patient ID: Matthew May, male    DOB: 09/01/1945, 76 y.o.   MRN: [id number]  Chief Complaint  Patient presents with   Diabetes    1 month for diabetes follow up   Leg Pain   PT presents to the office today to follow up on uncontrolled DM. He was seen on 01/30/23 and his A1C was 8.4. We increased his Glimepiride to 4 mg from 2 mg. He was told to continue his Farxiga 10 mg.   He is having bilateral leg pain. He is taking Demadex 40 mg daily.  Diabetes He presents for his follow-up diabetic visit. He has type 2 diabetes mellitus. Pertinent negatives for hypoglycemia include no sleepiness. Associated symptoms include blurred vision and foot paresthesias. Symptoms are stable. Diabetic complications include peripheral neuropathy. Risk factors for coronary artery disease include diabetes mellitus, dyslipidemia, hypertension, male sex and sedentary lifestyle. He is following a generally unhealthy diet. His overall blood glucose range is 110-130 mg/dl.  Leg Pain  The incident occurred more than 1 week ago. The pain is present in the right leg and left leg. The quality of the pain is described as cramping and aching. The pain is at a severity of 9/10. The pain has been Constant since onset. The symptoms are aggravated by movement and weight bearing.      Review of Systems  Eyes:  Positive for blurred vision.  All other systems reviewed and are negative.      Objective:   Physical Exam Vitals reviewed.  Constitutional:      General: He is not in acute distress.    Appearance: He is well-developed. He is obese.  HENT:     Head: Normocephalic.     Right Ear: Tympanic membrane normal.     Left Ear: Tympanic membrane normal.  Eyes:     General:        Right eye: No discharge.        Left eye: No discharge.     Pupils: Pupils are equal, round, and reactive to light.  Neck:     Thyroid: No thyromegaly.  Cardiovascular:     Rate and Rhythm: Normal rate and regular  rhythm.     Heart sounds: Normal heart sounds. No murmur heard. Pulmonary:     Effort: Pulmonary effort is normal. No respiratory distress.     Breath sounds: Normal breath sounds. No wheezing.  Abdominal:     General: Bowel sounds are normal. There is no distension.     Palpations: Abdomen is soft.     Tenderness: There is no abdominal tenderness.  Musculoskeletal:        General: No tenderness. Normal range of motion.     Cervical back: Normal range of motion and neck supple.     Right lower leg: Edema (2+) present.     Left lower leg: Edema (2+) present.  Skin:    General: Skin is warm and dry.     Findings: No erythema or rash.  Neurological:     Mental Status: He is alert.     Cranial Nerves: No cranial nerve deficit.     Motor: Weakness present.     Gait: Gait abnormal (using cane).     Deep Tendon Reflexes: Reflexes are normal and symmetric.  Psychiatric:        Behavior: Behavior normal.        Thought Content: Thought content normal.        Judgment:  Judgment normal.      BP 101/76   Pulse (!) 104   Temp (!) 96.8 F (36 C) (Temporal)   Ht 5\' 6"  (1.676 m)   Wt 232 lb 6.4 oz (105.4 kg)   SpO2 96%   BMI 37.51 kg/m      Assessment & Plan:  Matthew May comes in today with chief complaint of Diabetes (1 month for diabetes follow up) and Leg Pain   Diagnosis and orders addressed:  1. Type 2 diabetes mellitus with other specified complication, without long-term current use of insulin (HCC) Continue current medications  Low carb diet  - CMP14+EGFR - CBC with Differential/Platelet  2. Morbid obesity (HCC) - CMP14+EGFR - CBC with Differential/Platelet  3. PAD (peripheral artery disease) (HCC) Continue demadex  Compression hose Referral to Vascular - Ambulatory referral to Vascular Surgery - CMP14+EGFR - CBC with Differential/Platelet   Labs pending Health Maintenance reviewed Diet and exercise encouraged  Follow up plan: 2 months     [name], FNP

## 2023-03-20 LAB — CMP14+EGFR
ALT: 39 IU/L (ref 0–44)
AST: 34 IU/L (ref 0–40)
Albumin: 4 g/dL (ref 3.8–4.8)
Alkaline Phosphatase: 98 IU/L (ref 44–121)
BUN/Creatinine Ratio: 17 (ref 10–24)
BUN: 16 mg/dL (ref 8–27)
Bilirubin Total: 0.6 mg/dL (ref 0.0–1.2)
CO2: 24 mmol/L (ref 20–29)
Calcium: 9.6 mg/dL (ref 8.6–10.2)
Chloride: 100 mmol/L (ref 96–106)
Creatinine, Ser: 0.93 mg/dL (ref 0.76–1.27)
Globulin, Total: 3 g/dL (ref 1.5–4.5)
Glucose: 144 mg/dL — ABNORMAL HIGH (ref 70–99)
Potassium: 4.4 mmol/L (ref 3.5–5.2)
Sodium: 139 mmol/L (ref 134–144)
Total Protein: 7 g/dL (ref 6.0–8.5)
eGFR: 86 mL/min/{1.73_m2} (ref >59)

## 2023-03-20 LAB — CBC WITH DIFFERENTIAL/PLATELET
Basos: 0 %
EOS (ABSOLUTE): 0 10*3/uL (ref 0.0–0.2)
Eos: 1 %
Hematocrit: 41.6 % (ref 37.5–51.0)
Hemoglobin: 13.5 g/dL (ref 13.0–17.7)
Immature Granulocytes: 0 %
Immature Granulocytes: 0 10*3/uL (ref 0.0–0.1)
Lymphs: 15 %
MCH: 29.7 pg (ref 26.6–33.0)
MCHC: 32.5 g/dL (ref 31.5–35.7)
MCV: 91 fL (ref 79–97)
Monocytes Absolute: 0.1 10*3/uL (ref 0.0–0.4)
Monocytes Absolute: 0.5 10*3/uL (ref 0.1–0.9)
Monocytes: 5 %
Neutrophils Absolute: 1.4 10*3/uL (ref 0.7–3.1)
Neutrophils Absolute: 7.6 10*3/uL — ABNORMAL HIGH (ref 1.4–7.0)
Neutrophils: 79 %
Platelets: 197 10*3/uL (ref 150–450)
RBC: 4.55 x10E6/uL (ref 4.14–5.80)
RDW: 12.3 % (ref 11.6–15.4)
WBC: 9.6 10*3/uL (ref 3.4–10.8)

## 2023-03-22 ENCOUNTER — Other Ambulatory Visit: Payer: Self-pay | Admitting: *Deleted

## 2023-03-22 DIAGNOSIS — I739 Peripheral vascular disease, unspecified: Secondary | ICD-10-CM

## 2023-03-22 DIAGNOSIS — G44219 Episodic tension-type headache, not intractable: Secondary | ICD-10-CM | POA: Diagnosis not present

## 2023-03-22 DIAGNOSIS — H40033 Anatomical narrow angle, bilateral: Secondary | ICD-10-CM | POA: Diagnosis not present

## 2023-03-27 ENCOUNTER — Other Ambulatory Visit: Payer: Self-pay | Admitting: Family

## 2023-03-27 ENCOUNTER — Other Ambulatory Visit: Payer: Self-pay | Admitting: Cardiology

## 2023-04-02 NOTE — Progress Notes (Deleted)
Patient name: Matthew May MRN: [id number] DOB: 03-31-1946 Sex: male  REASON FOR CONSULT: PAD with pain  HPI: Matthew May is a 77 y.o. male, with history of diabetes, atrial fibrillation, PAD the presents for evaluation of PAD with leg pain.   Patient has previously been seen in our practice for subclavian steal on 01/29/2018.  Past Medical History:  Diagnosis Date   Atrial fibrillation (HCC)    CAD (coronary artery disease)    a. s/p NSTEMI 4/11 => s/p CABG (L-LAD, S-OM2, S-PDA/PL);  b.  ETT-Myoview 6/14:  normal study, no ischemia, EF 67%   Carotid stenosis    Carotid U/S 6/14:  RICA 1-39%, LICA 40-59%; right vertebral flow retrograde suggestive of steel-consider PV consult   Cataract    COPD (chronic obstructive pulmonary disease) (HCC)    Diabetes mellitus without complication (HCC)    HLD (hyperlipidemia)    HTN (hypertension)    Obesity    Renal insufficiency    Severe aortic stenosis    Subclavian artery stenosis, right (HCC)    based upon carotid U/S done 11/2012    Past Surgical History:  Procedure Laterality Date   CATARACT EXTRACTION Left    CORONARY ARTERY BYPASS GRAFT     2011, LIMA to LAD coronary artery, SVG to OM2 branch of lect circumflex coronary artery, and a sequential SVG to psot descening to posterolateral branches to RCA   endoscopic vein harvesting     right leg    HERNIA REPAIR     RIGHT/LEFT HEART CATH AND CORONARY/GRAFT ANGIOGRAPHY N/A 12/31/2017   Procedure: RIGHT/LEFT HEART CATH AND CORONARY/GRAFT ANGIOGRAPHY;  Surgeon: Marykay Lex, MD;  Location: [facility];  Service: Cardiovascular;  Laterality: N/A;   RIGHT/LEFT HEART CATH AND CORONARY/GRAFT ANGIOGRAPHY N/A 12/30/2019   Procedure: RIGHT/LEFT HEART CATH AND CORONARY/GRAFT ANGIOGRAPHY;  Surgeon: Lyn Records, MD;  Location: [facility];  Service: Cardiovascular;  Laterality: N/A;   TEE WITHOUT CARDIOVERSION N/A 01/20/2020   Procedure: TRANSESOPHAGEAL ECHOCARDIOGRAM  (TEE);  Surgeon: Tonny Bollman, MD;  Location: [facility] OR;  Service: Open Heart Surgery;  Laterality: N/A;   TONSILLECTOMY     TRANSCATHETER AORTIC VALVE REPLACEMENT, TRANSFEMORAL N/A 01/20/2020   Procedure: TRANSCATHETER AORTIC VALVE REPLACEMENT, TRANSFEMORALUSING EDWARDS SAPIEN 3 29 MM AORTIC THV.;  Surgeon: Tonny Bollman, MD;  Location: [facility] OR;  Service: Open Heart Surgery;  Laterality: N/A;    Family History  Problem Relation Age of Onset   Heart disease Father    Heart attack Father    Mental illness Mother    Mental illness Sister    Diabetes Brother    Depression Maternal Aunt     SOCIAL HISTORY: Social History   Socioeconomic History   Marital status: Married    Spouse name: [place] Lions   Number of children: 3   Years of education: 8   Highest education level: 8th grade  Occupational History   Occupation: Retired    Comment: scrap yard  Tobacco Use   Smoking status: Former    Current packs/day: 0.00    Types: Cigarettes    Quit date: 07/11/2009    Years since quitting: 13.7   Smokeless tobacco: Never   Tobacco comments:    smoked about 10 cig/day; used to smoke 2 ppd for many years   Vaping Use   Vaping status: Never Used  Substance and Sexual Activity   Alcohol use: No    Alcohol/week: 0.0 standard drinks of alcohol  Drug use: No   Sexual activity: Never  Other Topics Concern   Not on file  Social History Narrative   Lives at home with his wife. He is retired but his wife continues to work daily. They have adult children that live locally and grandchildren that they spend time with regularly. He is active around his home and yard.    Social Determinants of Health   Financial Resource Strain: Low Risk  (08/25/2022)   Overall Financial Resource Strain (CARDIA)    Difficulty of Paying Living Expenses: Not hard at all  Food Insecurity: No Food Insecurity (08/25/2022)   Hunger Vital Sign    Worried About Running Out of Food in the Last Year: Never true    Ran Out of  Food in the Last Year: Never true  Transportation Needs: No Transportation Needs (08/25/2022)   PRAPARE - Administrator, Civil Service (Medical): No    Lack of Transportation (Non-Medical): No  Physical Activity: Insufficiently Active (08/25/2022)   Exercise Vital Sign    Days of Exercise per Week: 3 days    Minutes of Exercise per Session: 30 min  Stress: No Stress Concern Present (08/25/2022)   [employer] of Occupational Health - Occupational Stress Questionnaire    Feeling of Stress : Not at all  Social Connections: Moderately Isolated (08/25/2022)   Social Connection and Isolation Panel [NHANES]    Frequency of Communication with Friends and Family: More than three times a week    Frequency of Social Gatherings with Friends and Family: More than three times a week    Attends Religious Services: Never    Database administrator or Organizations: No    Attends Banker Meetings: Never    Marital Status: Married  Catering manager Violence: Not At Risk (08/25/2022)   Humiliation, Afraid, Rape, and Kick questionnaire    Fear of Current or Ex-Partner: No    Emotionally Abused: No    Physically Abused: No    Sexually Abused: No    Allergies  Allergen Reactions   Prednisone Other (See Comments)    Pt states "sugar went to 580"   Januvia [Sitagliptin] Nausea And Vomiting   Jardiance [Empagliflozin] Rash    Tolerated Farxiga samples   Lisinopril Cough    Current Outpatient Medications  Medication Sig Dispense Refill   acetaminophen (TYLENOL) 325 MG tablet Take 2 tablets (650 mg total) by mouth every 6 (six) hours as needed for mild pain (or Fever >/= 101).     albuterol (VENTOLIN HFA) 108 (90 Base) MCG/ACT inhaler INHALE 2 PUFFS BY MOUTH EVERY 6 HOURS AS NEEDED FOR WHEEZING AND FOR SHORTNESS OF BREATH 9 g 0   atorvastatin (LIPITOR) 80 MG tablet Take 1 tablet (80 mg total) by mouth daily. 90 tablet 0   Budeson-Glycopyrrol-Formoterol (BREZTRI AEROSPHERE)  160-9-4.8 MCG/ACT AERO Inhale 2 puffs into the lungs 2 (two) times daily. 32.1 g 11   cetirizine (ZYRTEC) 10 MG tablet Take 1 tablet (10 mg total) by mouth daily. 90 tablet 1   cholecalciferol (VITAMIN D3) 25 MCG (1000 UT) tablet Take 1,000 Units by mouth daily.     dapagliflozin propanediol (FARXIGA) 10 MG TABS tablet Take 1 tablet (10 mg total) by mouth daily. 90 tablet 2   ELIQUIS 5 MG TABS tablet Take 1 tablet by mouth twice daily 180 tablet 0   gabapentin (NEURONTIN) 100 MG capsule TAKE 1 CAPSULE BY MOUTH 3 TIMES  DAILY 300 capsule 0   glimepiride (  AMARYL) 4 MG tablet Take 1 tablet (4 mg total) by mouth daily with breakfast. 90 tablet 1   glucose blood (ONETOUCH VERIO) test strip CHECK BLOOD SUGAR DAILY Dx E11.9 100 strip 3   Lancets (ONETOUCH DELICA PLUS LANCET33G) MISC CHECK BLOOD SUGAR DAILY Dx E11.9 100 each 3   levothyroxine (SYNTHROID) 150 MCG tablet Take 1 tablet (150 mcg total) by mouth daily. 90 tablet 3   metFORMIN (GLUCOPHAGE) 1000 MG tablet Take 1 tablet (1,000 mg total) by mouth 2 (two) times daily. 180 tablet 1   nitroGLYCERIN (NITROSTAT) 0.4 MG SL tablet DISSOLVE ONE TABLET UNDER THE TONGUE EVERY 5 MINUTES AS NEEDED FOR CHEST PAIN.  DO NOT EXCEED A TOTAL OF 3 DOSES IN 15 MINUTES 25 tablet 1   potassium chloride SA (KLOR-CON M) 20 MEQ tablet TAKE 1 TABLET BY MOUTH DAILY 100 tablet 0   torsemide (DEMADEX) 20 MG tablet Take 2 tablets (40 mg total) by mouth daily. 180 tablet 0   Vitamin D, Ergocalciferol, (DRISDOL) 1.25 MG (50000 UNIT) CAPS capsule Take 1 capsule (50,000 Units total) by mouth every 7 (seven) days. 12 capsule 3   No current facility-administered medications for this visit.    REVIEW OF SYSTEMS:  [X]  denotes positive finding, [ ]  denotes negative finding Cardiac  Comments:  Chest pain or chest pressure: ***   Shortness of breath upon exertion:    Short of breath when lying flat:    Irregular heart rhythm:        Vascular    Pain in calf, thigh, or hip  brought on by ambulation:    Pain in feet at night that wakes you up from your sleep:     Blood clot in your veins:    Leg swelling:         Pulmonary    Oxygen at home:    Productive cough:     Wheezing:         Neurologic    Sudden weakness in arms or legs:     Sudden numbness in arms or legs:     Sudden onset of difficulty speaking or slurred speech:    Temporary loss of vision in one eye:     Problems with dizziness:         Gastrointestinal    Blood in stool:     Vomited blood:         Genitourinary    Burning when urinating:     Blood in urine:        Psychiatric    Major depression:         Hematologic    Bleeding problems:    Problems with blood clotting too easily:        Skin    Rashes or ulcers:        Constitutional    Fever or chills:      PHYSICAL EXAM: There were no vitals filed for this visit.  GENERAL: The patient is a well-nourished male, in no acute distress. The vital signs are documented above. CARDIAC: There is a regular rate and rhythm.  VASCULAR: *** PULMONARY: There is good air exchange bilaterally without wheezing or rales. ABDOMEN: Soft and non-tender with normal pitched bowel sounds.  MUSCULOSKELETAL: There are no major deformities or cyanosis. NEUROLOGIC: No focal weakness or paresthesias are detected. SKIN: There are no ulcers or rashes noted. PSYCHIATRIC: The patient has a normal affect.  DATA:   ABI  Assessment/Plan:  77 y.o. male, with  history of diabetes, atrial fibrillation, PAD the presents for evaluation of PAD with leg pain.   Cephus Shelling, MD [facility] Office: [phone]

## 2023-04-03 ENCOUNTER — Encounter: Payer: Medicare Other | Admitting: Vascular Surgery

## 2023-04-03 ENCOUNTER — Ambulatory Visit (HOSPITAL_COMMUNITY): Admission: RE | Admit: 2023-04-03 | Payer: Medicare Other | Source: Ambulatory Visit

## 2023-04-04 DIAGNOSIS — H353231 Exudative age-related macular degeneration, bilateral, with active choroidal neovascularization: Secondary | ICD-10-CM | POA: Diagnosis not present

## 2023-04-08 NOTE — Progress Notes (Unsigned)
Cardiology Office Note:   Date:  04/11/2023  ID:  Matthew May, DOB 1946-02-29, MRN [id number] PCP: [name], FNP  [facility] Providers Cardiologist:  Matthew Rotunda, MD {  History of Present Illness:   Matthew May is a 77 y.o. male who presents for followup after TAVR.  He has a history of CABG.  In July 2021 he had cath with occluded SVG-> LCx, patent sequential SVG -->PDA and PL, widely patent LIMA -->LAD, widely patent native left main. There was a patent LAD with competitive flow into the distal LAD from LIMA and severe diffuse disease in the mid to distal circumflex with functional total occlusion and collateralization of the second obtuse marginal from right to left collaterals. There was total occlusion mid RCA.   He had dizziness post TAVR last fall but a monitor demonstrated no arrhythmias.   He had a normal TAVR function on echo in Aug 2022.     The patient denies any acute cardiovascular symptoms.  He gets around with his cane.  He still drives.  He sleeps chronically in a chair.  He says he is not having any increased shortness of breath.  He does not have any new chest pressure, neck or arm discomfort.  He says he has frequent urination at night.  He has chronic lower extremity swelling.    ROS: As stated in the HPI and negative for all other systems.  Studies Reviewed:    EKG:      Risk Assessment/Calculations:    CHA2DS2-VASc Score = 5   This indicates a 7.2% annual risk of stroke. The patient's score is based upon: CHF History: 0 HTN History: 1 Diabetes History: 1 Stroke History: 0 Vascular Disease History: 1 Age Score: 2 Gender Score: 0     Physical Exam:   VS:  BP 104/80   Pulse 84   Ht 5' 7.5" (1.715 m)   Wt 237 lb (107.5 kg)   BMI 36.57 kg/m    Wt Readings from Last 3 Encounters:  04/11/23 237 lb (107.5 kg)  03/19/23 232 lb 6.4 oz (105.4 kg)  01/30/23 242 lb 12.8 oz (110.1 kg)     GEN: Well nourished, well developed in no  acute distress NECK: No JVD; No carotid bruits CARDIAC: Irregular RR, very brief apical systolic murmur nonradiating, no diastolic murmurs, rubs, gallops RESPIRATORY:  Clear to auscultation without rales, wheezing or rhonchi  ABDOMEN: Soft, non-tender, non-distended EXTREMITIES: Moderately severe leg edema; No deformity   ASSESSMENT AND PLAN:   ATRIAL FIB:   He tolerates anticoagulation.  He is on the correct dose of anticoagulation.  He has had good rate control no symptoms.  No change in therapy.   CORONARY ATHEROSCLEROSIS NATIVE CORONARY ARTERY - The patient has no new sypmtoms.  No further cardiovascular testing is indicated.  We will continue with aggressive risk reduction and meds as listed.  HYPERTENSION -  The blood pressure is at target.  No change in therapy.    DYSLIPIDEMIA -   LDL was 193.  This is up from 77.  He says he is taking his medicines.  I am going to switch him to Crestor 40 mg daily stopping the Lipitor and repeat a lipid in 3 months.    TAVR - This was stable on follow-up echo.   I will follow clinically.     OBESITY - We have previously had long discussions about weight loss.     DM - His last A1c  was up to 8.4.  He is now on [place].  I will defer to his primary provider.        Follow up with me in 1 year  Signed, Matthew Rotunda, MD

## 2023-04-11 ENCOUNTER — Ambulatory Visit (INDEPENDENT_AMBULATORY_CARE_PROVIDER_SITE_OTHER): Payer: Medicare Other | Admitting: Cardiology

## 2023-04-11 ENCOUNTER — Other Ambulatory Visit: Payer: Self-pay | Admitting: *Deleted

## 2023-04-11 ENCOUNTER — Encounter: Payer: Self-pay | Admitting: Cardiology

## 2023-04-11 VITALS — BP 104/80 | HR 84 | Ht 67.5 in | Wt 237.0 lb

## 2023-04-11 DIAGNOSIS — E785 Hyperlipidemia, unspecified: Secondary | ICD-10-CM | POA: Diagnosis not present

## 2023-04-11 DIAGNOSIS — Z79899 Other long term (current) drug therapy: Secondary | ICD-10-CM | POA: Diagnosis not present

## 2023-04-11 DIAGNOSIS — I10 Essential (primary) hypertension: Secondary | ICD-10-CM | POA: Diagnosis not present

## 2023-04-11 DIAGNOSIS — E118 Type 2 diabetes mellitus with unspecified complications: Secondary | ICD-10-CM | POA: Diagnosis not present

## 2023-04-11 DIAGNOSIS — Z952 Presence of prosthetic heart valve: Secondary | ICD-10-CM | POA: Diagnosis not present

## 2023-04-11 DIAGNOSIS — I480 Paroxysmal atrial fibrillation: Secondary | ICD-10-CM

## 2023-04-11 MED ORDER — ROSUVASTATIN CALCIUM 40 MG PO TABS: 40.0000 mg | ORAL_TABLET | Freq: Every day | ORAL | 3 refills | Status: DC

## 2023-04-11 NOTE — Patient Instructions (Signed)
Medication Instructions:  Please discontinue Atorvastatin and start Crestor 40 mg once a day. Continue all other medications as listed.  *If you need a refill on your cardiac medications before your next appointment, please call your pharmacy*  Lab: Please have blood work in 3 months (Lipid)  Follow-Up: At [facility] you and your health needs are our priority.  As part of our continuing mission to provide you with exceptional heart care, we have created designated Provider Care Teams.  These Care Teams include your primary Cardiologist (physician) and Advanced Practice Providers (APPs -  Physician Assistants and Nurse Practitioners) who all work together to provide you with the care you need, when you need it.  We recommend signing up for the patient portal called "MyChart".  Sign up information is provided on this After Visit Summary.  MyChart is used to connect with patients for Virtual Visits (Telemedicine).  Patients are able to view lab/test results, encounter notes, upcoming appointments, etc.  Non-urgent messages can be sent to your provider as well.   To learn more about what you can do with MyChart, go to [email or web address].    Your next appointment:   1 year(s)  Provider:   Rollene Rotunda, MD

## 2023-04-14 ENCOUNTER — Other Ambulatory Visit: Payer: Self-pay | Admitting: Family

## 2023-04-14 DIAGNOSIS — E1169 Type 2 diabetes mellitus with other specified complication: Secondary | ICD-10-CM

## 2023-05-02 DIAGNOSIS — H353231 Exudative age-related macular degeneration, bilateral, with active choroidal neovascularization: Secondary | ICD-10-CM | POA: Diagnosis not present

## 2023-05-21 ENCOUNTER — Ambulatory Visit: Payer: Medicare Other | Admitting: Family

## 2023-05-22 ENCOUNTER — Encounter: Payer: Self-pay | Admitting: Family

## 2023-06-05 ENCOUNTER — Other Ambulatory Visit: Payer: Self-pay | Admitting: Family

## 2023-06-05 DIAGNOSIS — I480 Paroxysmal atrial fibrillation: Secondary | ICD-10-CM

## 2023-06-11 ENCOUNTER — Ambulatory Visit: Payer: Medicare Other | Admitting: Family

## 2023-06-13 ENCOUNTER — Other Ambulatory Visit: Payer: Self-pay | Admitting: Family

## 2023-06-23 ENCOUNTER — Other Ambulatory Visit: Payer: Self-pay | Admitting: Family

## 2023-06-23 DIAGNOSIS — E1169 Type 2 diabetes mellitus with other specified complication: Secondary | ICD-10-CM

## 2023-06-25 MED ORDER — METFORMIN HCL 1000 MG PO TABS: 1000.0000 mg | ORAL_TABLET | Freq: Two times a day (BID) | ORAL | 0 refills | Status: DC

## 2023-06-25 NOTE — Telephone Encounter (Signed)
I called pt & made him an appt on 07-13-2023 @ 3:10pm w/[name] for Med refill.

## 2023-06-25 NOTE — Telephone Encounter (Signed)
[name] NTBS for 2 mos FU was to be in Dec NO RF sent to pharmacy

## 2023-06-25 NOTE — Addendum Note (Signed)
Addended by: Julious Payer D on: 06/25/2023 10:47 AM   Modules accepted: Orders

## 2023-07-13 ENCOUNTER — Ambulatory Visit: Payer: Medicare Other | Admitting: Family

## 2023-07-17 ENCOUNTER — Other Ambulatory Visit: Payer: Self-pay | Admitting: Family

## 2023-07-17 DIAGNOSIS — E1142 Type 2 diabetes mellitus with diabetic polyneuropathy: Secondary | ICD-10-CM

## 2023-07-23 ENCOUNTER — Telehealth: Payer: Self-pay

## 2023-07-23 NOTE — Telephone Encounter (Signed)
Ok to do at next appointment.

## 2023-07-23 NOTE — Telephone Encounter (Signed)
 Patient aware and verbalized understanding.

## 2023-07-23 NOTE — Telephone Encounter (Signed)
Copied from CRM [id number]. Topic: Clinical - Request for Lab/Test Order >> Jul 23, 2023 11:04 AM Martha Clan wrote: Reason for CRM: Patient needs labs drawn for his cardiologist and was hoping to get it tied into his appointment on 07/27/2023 at 0920.

## 2023-07-24 ENCOUNTER — Ambulatory Visit: Payer: Medicare Other | Admitting: Family

## 2023-07-27 ENCOUNTER — Ambulatory Visit: Payer: Medicare Other | Admitting: Family

## 2023-07-27 ENCOUNTER — Encounter: Payer: Self-pay | Admitting: Family

## 2023-07-27 VITALS — BP 97/73 | HR 74 | Temp 97.8°F | Ht 67.0 in | Wt 228.2 lb

## 2023-07-27 DIAGNOSIS — E1159 Type 2 diabetes mellitus with other circulatory complications: Secondary | ICD-10-CM

## 2023-07-27 DIAGNOSIS — I10 Essential (primary) hypertension: Secondary | ICD-10-CM

## 2023-07-27 DIAGNOSIS — I2510 Atherosclerotic heart disease of native coronary artery without angina pectoris: Secondary | ICD-10-CM | POA: Diagnosis not present

## 2023-07-27 DIAGNOSIS — R000 Tachycardia, unspecified: Secondary | ICD-10-CM | POA: Diagnosis not present

## 2023-07-27 DIAGNOSIS — R600 Localized edema: Secondary | ICD-10-CM

## 2023-07-27 DIAGNOSIS — E559 Vitamin D deficiency, unspecified: Secondary | ICD-10-CM

## 2023-07-27 DIAGNOSIS — R0789 Other chest pain: Secondary | ICD-10-CM

## 2023-07-27 DIAGNOSIS — J449 Chronic obstructive pulmonary disease, unspecified: Secondary | ICD-10-CM

## 2023-07-27 DIAGNOSIS — I739 Peripheral vascular disease, unspecified: Secondary | ICD-10-CM

## 2023-07-27 DIAGNOSIS — E1142 Type 2 diabetes mellitus with diabetic polyneuropathy: Secondary | ICD-10-CM | POA: Diagnosis not present

## 2023-07-27 DIAGNOSIS — E1169 Type 2 diabetes mellitus with other specified complication: Secondary | ICD-10-CM | POA: Diagnosis not present

## 2023-07-27 DIAGNOSIS — I5033 Acute on chronic diastolic (congestive) heart failure: Secondary | ICD-10-CM

## 2023-07-27 DIAGNOSIS — I480 Paroxysmal atrial fibrillation: Secondary | ICD-10-CM

## 2023-07-27 DIAGNOSIS — I110 Hypertensive heart disease with heart failure: Secondary | ICD-10-CM | POA: Diagnosis not present

## 2023-07-27 DIAGNOSIS — E038 Other specified hypothyroidism: Secondary | ICD-10-CM

## 2023-07-27 DIAGNOSIS — I152 Hypertension secondary to endocrine disorders: Secondary | ICD-10-CM

## 2023-07-27 LAB — BAYER DCA HB A1C WAIVED: HB A1C (BAYER DCA - WAIVED): 7.5 % — ABNORMAL HIGH (ref 4.8–5.6)

## 2023-07-27 MED ORDER — GLIMEPIRIDE 4 MG PO TABS: 4.0000 mg | ORAL_TABLET | Freq: Every day | ORAL | 1 refills | Status: DC

## 2023-07-27 MED ORDER — LEVOTHYROXINE SODIUM 150 MCG PO TABS: 150.0000 ug | ORAL_TABLET | Freq: Every day | ORAL | 3 refills | Status: DC

## 2023-07-27 MED ORDER — NITROGLYCERIN 0.4 MG SL SUBL
SUBLINGUAL_TABLET | SUBLINGUAL | 1 refills | Status: DC
Start: 2023-07-27 — End: 2023-08-23

## 2023-07-27 MED ORDER — TORSEMIDE 20 MG PO TABS: 40.0000 mg | ORAL_TABLET | Freq: Every day | ORAL | 0 refills | Status: DC

## 2023-07-27 MED ORDER — ALBUTEROL SULFATE HFA 108 (90 BASE) MCG/ACT IN AERS: 1.0000 | INHALATION_SPRAY | Freq: Four times a day (QID) | RESPIRATORY_TRACT | 2 refills | Status: DC | PRN

## 2023-07-27 MED ORDER — DAPAGLIFLOZIN PROPANEDIOL 10 MG PO TABS: 10.0000 mg | ORAL_TABLET | Freq: Every day | ORAL | 2 refills | Status: DC

## 2023-07-27 MED ORDER — METFORMIN HCL 1000 MG PO TABS: 1000.0000 mg | ORAL_TABLET | Freq: Two times a day (BID) | ORAL | 0 refills | Status: DC

## 2023-07-27 MED ORDER — APIXABAN 5 MG PO TABS: 5.0000 mg | ORAL_TABLET | Freq: Two times a day (BID) | ORAL | 0 refills | Status: DC

## 2023-07-27 MED ORDER — GABAPENTIN 100 MG PO CAPS: 100.0000 mg | ORAL_CAPSULE | Freq: Three times a day (TID) | ORAL | 0 refills | Status: DC

## 2023-07-27 MED ORDER — ELIQUIS 5 MG PO TABS: 5.0000 mg | ORAL_TABLET | Freq: Two times a day (BID) | ORAL | 0 refills | Status: DC

## 2023-07-27 NOTE — Patient Instructions (Signed)
 Atrial Fibrillation Atrial fibrillation (AFib) is a type of irregular or rapid heartbeat (arrhythmia). In AFib, the top part of the heart (atria) beats in an irregular pattern. This makes the heart unable to pump blood normally and effectively. The goal of treatment is to prevent blood clots from forming, control your heart rate, or restore your heartbeat to a normal rhythm. If this condition is not treated, it can cause serious problems, such as a weakened heart muscle (cardiomyopathy) or a stroke. What are the causes? This condition is often caused by medical conditions that damage the heart's electrical system. These include: High blood pressure (hypertension). This is the most common cause. Certain heart problems or conditions, such as heart failure, coronary artery disease, heart valve problems, or heart surgery. Diabetes. Overactive thyroid (hyperthyroidism). Chronic kidney disease. Certain lung conditions, such as emphysema, pneumonia, or COPD. Obstructive sleep apnea. In some cases, the cause of this condition is not known. What increases the risk? This condition is more likely to develop in: Older adults. Athletes who do endurance exercise. People who have a family history of AFib. Males. People who are Caucasian. People who are obese. People who smoke or misuse alcohol. What are the signs or symptoms? Symptoms of this condition include: Fast or irregular heartbeats (palpitations). Discomfort or pain in your chest. Shortness of breath. Sudden light-headedness or weakness. Tiring easily during exercise or activity. Syncope (fainting). Sweating. In some cases, there are no symptoms. How is this diagnosed? Your health care provider may detect AFib when taking your pulse. If detected, this condition may be diagnosed with: An electrocardiogram (ECG) to check electrical signals of the heart. An ambulatory cardiac monitor to record your heart's activity for a few days. A  transthoracic echocardiogram (TTE) to create pictures of your heart. A transesophageal echocardiogram (TEE) to create even clearer pictures of your heart. A stress test to check your blood supply while you exercise. Imaging tests, such as a CT scan or chest X-ray. Blood tests. How is this treated? Treatment depends on underlying conditions and how you feel when you get AFib. This condition may be treated with: Medicines to prevent blood clots or to treat heart rate or heart rhythm problems. Electrical cardioversion to reset the heart's rhythm. A pacemaker to correct abnormal heart rhythm. Ablation to remove the heart tissue that sends abnormal signals. Left atrial appendage closure to seal the area where blood clots can form. In some cases, underlying conditions will be treated. Follow these instructions at home: Medicines Take over-the counter and prescription medicines only as told by your provider. Do not take any new medicines without talking to your provider. If you are taking blood thinners: Talk with your provider before taking aspirin or NSAIDs. These medicines can raise your risk of bleeding. Take your medicines as told. Take them at the same time each day. Do not do things that could hurt or bruise you. Be careful to avoid falls. Wear an alert bracelet or carry a card that says that you take blood thinners. Lifestyle Do not use any products that contain nicotine or tobacco. These products include cigarettes, chewing tobacco, and vaping devices, such as e-cigarettes. If you need help quitting, ask your provider. Eat heart-healthy foods. Talk with a food expert (dietitian) to make an eating plan that is right for you. Exercise regularly as told by your provider. Do not drink alcohol. Lose weight if you are overweight. General instructions If you have obstructive sleep apnea, manage your condition as told by your provider.  Do not use diet pills unless your provider approves. Diet  pills can make heart problems worse. Keep all follow-up visits. Your provider will want to check your heart rate and rhythm regularly. Contact a health care provider if: You notice a change in the rate, rhythm, or strength of your heartbeat. You are taking a blood thinner and you notice more bruising. You tire more easily when you exercise or do heavy work. You have a sudden change in weight. Get help right away if:  You have chest pain. You have trouble breathing. You have side effects of blood thinners, such as blood in your vomit, poop (stool), or pee (urine), or bleeding that does not stop. You have any symptoms of a stroke. "BE FAST" is an easy way to remember the main warning signs of a stroke: B - Balance. Signs are dizziness, sudden trouble walking, or loss of balance. E - Eyes. Signs are trouble seeing or a sudden change in vision. F - Face. Signs are sudden weakness or numbness of the face, or the face or eyelid drooping on one side. A - Arms. Signs are weakness or numbness in an arm. This happens suddenly and usually on one side of the body. S - Speech.Signs are sudden trouble speaking, slurred speech, or trouble understanding what people say. T - Time. Time to call emergency services. Write down what time symptoms started. Other signs of a stroke, such as: A sudden, severe headache with no known cause. Nausea or vomiting. Seizure. These symptoms may be an emergency. Get help right away. Call 911. Do not wait to see if the symptoms will go away. Do not drive yourself to the hospital. This information is not intended to replace advice given to you by your health care provider. Make sure you discuss any questions you have with your health care provider. Document Revised: 02/08/2022 Document Reviewed: 02/08/2022 Elsevier Patient Education  2024 [employer].

## 2023-07-27 NOTE — Progress Notes (Signed)
Subjective:    Patient ID: Matthew May, male    DOB: [birth year], 78 y.o.   MRN: [id number]  Chief Complaint  Patient presents with   Medical Management of Chronic Issues    Pt presents to  the office today for chronic follow up and uncontrolled DM. His A1C was 8.4 and we started him on Ozempic 0.25 mg, but states he could not afford this. He had a visit with our Clinical pharmacists and was able to get [place] covered.     Pt is followed by Cardiologists CAD, aortic stenosis, and A Fib. 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    He is moribid obese with a BMI of 35 and co moribitiy of DM and HTN.    He has COPD and states this is stable. Quit smoking in 2011. Takes Breztri BID.    Complaining of numbness and burning in bilateral feet and legs of 10 out 10 since starting the gabapentin 100 mg TID.  Hypertension This is a chronic problem. The current episode started more than 1 year ago. The problem has been resolved since onset. The problem is controlled. Associated symptoms include malaise/fatigue, peripheral edema and shortness of breath. Pertinent negatives include no blurred vision. Risk factors for coronary artery disease include dyslipidemia, diabetes mellitus, obesity and sedentary lifestyle. The current treatment provides moderate improvement. Identifiable causes of hypertension include a thyroid problem.  Congestive Heart Failure Presents for follow-up visit. Associated symptoms include edema, fatigue and shortness of breath. The symptoms have been stable.  Thyroid Problem Presents for follow-up visit. Symptoms include fatigue and tremors. Patient reports no anxiety, constipation or diarrhea. The symptoms have been stable.  Diabetes He presents for his follow-up diabetic visit. He has type 2 diabetes mellitus. Hypoglycemia symptoms include tremors. Pertinent negatives for hypoglycemia include no nervousness/anxiousness. Associated symptoms include fatigue and  foot paresthesias. Pertinent negatives for diabetes include no blurred vision. Diabetic complications include peripheral neuropathy. Risk factors for coronary artery disease include dyslipidemia, diabetes mellitus, hypertension, male sex and sedentary lifestyle. He is following a generally unhealthy diet. His overall blood glucose range is 110-130 mg/dl.  Hyperlipidemia This is a chronic problem. The current episode started more than 1 year ago. The problem is uncontrolled. Recent lipid tests were reviewed and are high. Exacerbating diseases include obesity. Associated symptoms include shortness of breath. Current antihyperlipidemic treatment includes statins. The current treatment provides moderate improvement of lipids. Risk factors for coronary artery disease include diabetes mellitus, dyslipidemia, male sex, hypertension and a sedentary lifestyle.      Review of Systems  Constitutional:  Positive for fatigue and malaise/fatigue.  Eyes:  Negative for blurred vision.  Respiratory:  Positive for shortness of breath.   Gastrointestinal:  Negative for constipation and diarrhea.  Neurological:  Positive for tremors.  Psychiatric/Behavioral:  The patient is not nervous/anxious.   All other systems reviewed and are negative.  Family History  Problem Relation Age of Onset   Heart disease Father    Heart attack Father    Mental illness Mother    Mental illness Sister    Diabetes Brother    Depression Maternal Aunt    Social History   Socioeconomic History   Marital status: Married    Spouse name: [place] Lions   Number of children: 3   Years of education: 8   Highest education level: 8th grade  Occupational History   Occupation: Retired    Comment: scrap yard  Tobacco Use   Smoking status: Former  Current packs/day: 0.00    Types: Cigarettes    Quit date: 07/11/2009    Years since quitting: 14.0   Smokeless tobacco: Never   Tobacco comments:    smoked about 10 cig/day; used to smoke 2  ppd for many years   Vaping Use   Vaping status: Never Used  Substance and Sexual Activity   Alcohol use: No    Alcohol/week: 0.0 standard drinks of alcohol   Drug use: No   Sexual activity: Never  Other Topics Concern   Not on file  Social History Narrative   Lives at home with his wife. He is retired but his wife continues to work daily. They have adult children that live locally and grandchildren that they spend time with regularly. He is active around his home and yard.    Social Drivers of Corporate investment banker Strain: Low Risk  (08/25/2022)   Overall Financial Resource Strain (CARDIA)    Difficulty of Paying Living Expenses: Not hard at all  Food Insecurity: No Food Insecurity (08/25/2022)   Hunger Vital Sign    Worried About Running Out of Food in the Last Year: Never true    Ran Out of Food in the Last Year: Never true  Transportation Needs: No Transportation Needs (08/25/2022)   PRAPARE - Administrator, Civil Service (Medical): No    Lack of Transportation (Non-Medical): No  Physical Activity: Insufficiently Active (08/25/2022)   Exercise Vital Sign    Days of Exercise per Week: 3 days    Minutes of Exercise per Session: 30 min  Stress: No Stress Concern Present (08/25/2022)   [employer] of Occupational Health - Occupational Stress Questionnaire    Feeling of Stress : Not at all  Social Connections: Moderately Isolated (08/25/2022)   Social Connection and Isolation Panel [NHANES]    Frequency of Communication with Friends and Family: More than three times a week    Frequency of Social Gatherings with Friends and Family: More than three times a week    Attends Religious Services: Never    Database administrator or Organizations: No    Attends Banker Meetings: Never    Marital Status: Married        Objective:   Physical Exam Vitals reviewed.  Constitutional:      General: He is not in acute distress.    Appearance: He is  well-developed. He is obese.  HENT:     Head: Normocephalic.     Right Ear: Tympanic membrane normal.     Left Ear: Tympanic membrane normal.  Eyes:     General:        Right eye: No discharge.        Left eye: No discharge.     Pupils: Pupils are equal, round, and reactive to light.  Neck:     Thyroid: No thyromegaly.  Cardiovascular:     Rate and Rhythm: Tachycardia present. Rhythm irregular.     Heart sounds: Normal heart sounds. No murmur heard. Pulmonary:     Effort: Pulmonary effort is normal. No respiratory distress.     Breath sounds: Wheezing present.  Abdominal:     General: Bowel sounds are normal. There is no distension.     Palpations: Abdomen is soft.     Tenderness: There is no abdominal tenderness.  Musculoskeletal:        General: No tenderness. Normal range of motion.     Cervical back: Normal range of  motion and neck supple.     Right lower leg: Edema present.     Left lower leg: Edema present.     Comments: PAD in bilateral legs, discoloration, blisters present  Skin:    General: Skin is warm and dry.     Findings: No erythema or rash.  Neurological:     Mental Status: He is alert and oriented to person, place, and time.     Cranial Nerves: No cranial nerve deficit.     Motor: Weakness present.     Gait: Gait abnormal (using cane).     Deep Tendon Reflexes: Reflexes are normal and symmetric.  Psychiatric:        Behavior: Behavior normal.        Thought Content: Thought content normal.        Judgment: Judgment normal.       BP 104/76   Pulse (!) 142   Temp 97.8 F (36.6 C)   Ht 5\' 7"  (1.702 m)   Wt 228 lb 3.2 oz (103.5 kg)   SpO2 98%   BMI 35.74 kg/m      Assessment & Plan:  Amaree Leeper Layton comes in today with chief complaint of Medical Management of Chronic Issues   Diagnosis and orders addressed:  1. Chronic obstructive pulmonary disease, unspecified COPD type (HCC) - CBC with Differential/Platelet - CMP14+EGFR - albuterol  (VENTOLIN HFA) 108 (90 Base) MCG/ACT inhaler; Inhale 1-2 puffs into the lungs every 6 (six) hours as needed for wheezing or shortness of breath.  Dispense: 9 g; Refill: 2  2. Type 2 diabetes mellitus with other specified complication, without long-term current use of insulin (HCC) - CBC with Differential/Platelet - CMP14+EGFR - Bayer DCA Hb A1c Waived - dapagliflozin propanediol (FARXIGA) 10 MG TABS tablet; Take 1 tablet (10 mg total) by mouth daily.  Dispense: 90 tablet; Refill: 2 - glimepiride (AMARYL) 4 MG tablet; Take 1 tablet (4 mg total) by mouth daily with breakfast.  Dispense: 90 tablet; Refill: 1 - metFORMIN (GLUCOPHAGE) 1000 MG tablet; Take 1 tablet (1,000 mg total) by mouth 2 (two) times daily.  Dispense: 200 tablet; Refill: 0  3. Acute on chronic diastolic CHF (congestive heart failure) (HCC) - CBC with Differential/Platelet - CMP14+EGFR - dapagliflozin propanediol (FARXIGA) 10 MG TABS tablet; Take 1 tablet (10 mg total) by mouth daily.  Dispense: 90 tablet; Refill: 2  4. Paroxysmal atrial fibrillation (HCC) - CBC with Differential/Platelet - CMP14+EGFR - ELIQUIS 5 MG TABS tablet; Take 1 tablet (5 mg total) by mouth 2 (two) times daily.  Dispense: 180 tablet; Refill: 0  5. Diabetic polyneuropathy associated with type 2 diabetes mellitus (HCC) - CBC with Differential/Platelet - CMP14+EGFR - gabapentin (NEURONTIN) 100 MG capsule; Take 1 capsule (100 mg total) by mouth 3 (three) times daily.  Dispense: 300 capsule; Refill: 0  6. Other specified hypothyroidism - CBC with Differential/Platelet - CMP14+EGFR - levothyroxine (SYNTHROID) 150 MCG tablet; Take 1 tablet (150 mcg total) by mouth daily.  Dispense: 90 tablet; Refill: 3 - TSH  7. Atypical chest pain - CBC with Differential/Platelet - CMP14+EGFR - nitroGLYCERIN (NITROSTAT) 0.4 MG SL tablet; DISSOLVE ONE TABLET UNDER THE TONGUE EVERY 5 MINUTES AS NEEDED FOR CHEST PAIN.  DO NOT EXCEED A TOTAL OF 3 DOSES IN 15 MINUTES   Dispense: 25 tablet; Refill: 1  8. Peripheral edema - CBC with Differential/Platelet - CMP14+EGFR - torsemide (DEMADEX) 20 MG tablet; Take 2 tablets (40 mg total) by mouth daily.  Dispense: 180 tablet; Refill:  0  9. Hypertension associated with diabetes (HCC) - CBC with Differential/Platelet - CMP14+EGFR - torsemide (DEMADEX) 20 MG tablet; Take 2 tablets (40 mg total) by mouth daily.  Dispense: 180 tablet; Refill: 0  10. Tachycardia (Primary) - CBC with Differential/Platelet - CMP14+EGFR - EKG 12-Lead - TSH  11. Atherosclerosis of native coronary artery of native heart without angina pectoris  12. Primary hypertension  13. Hyperlipidemia associated with type 2 diabetes mellitus (HCC) - Lipid panel  14. Morbid obesity (HCC)  15. Vitamin D deficiency  16. PAD (peripheral artery disease) (HCC) - Ambulatory referral to Vascular Surgery   Labs pending Pt will call Cardiologists today and make follow up. I can not adjust medications because he BP is too low.  Continue current medications  Low carb diet  Referral to Vascular for PAD and leg pain Health Maintenance reviewed Diet and exercise encouraged Approx 40 mins spent with patient, education, and chart review.   Follow up plan: 1 month for diabetes follow up  [name], FNP

## 2023-07-28 LAB — CBC WITH DIFFERENTIAL/PLATELET
Basophils Absolute: 0.1 10*3/uL (ref 0.0–0.2)
Basos: 1 %
EOS (ABSOLUTE): 0.1 10*3/uL (ref 0.0–0.4)
Eos: 1 %
Hematocrit: 42.1 % (ref 37.5–51.0)
Hemoglobin: 13.5 g/dL (ref 13.0–17.7)
Immature Grans (Abs): 0 10*3/uL (ref 0.0–0.1)
Immature Granulocytes: 0 %
Lymphocytes Absolute: 1.6 10*3/uL (ref 0.7–3.1)
Lymphs: 16 %
MCH: 27.4 pg (ref 26.6–33.0)
MCHC: 32.1 g/dL (ref 31.5–35.7)
MCV: 85 fL (ref 79–97)
Monocytes Absolute: 0.5 10*3/uL (ref 0.1–0.9)
Monocytes: 5 %
Neutrophils Absolute: 7.9 10*3/uL — ABNORMAL HIGH (ref 1.4–7.0)
Neutrophils: 77 %
Platelets: 197 10*3/uL (ref 150–450)
RBC: 4.93 x10E6/uL (ref 4.14–5.80)
RDW: 13 % (ref 11.6–15.4)
WBC: 10.3 10*3/uL (ref 3.4–10.8)

## 2023-07-28 LAB — LIPID PANEL
Chol/HDL Ratio: 3.1 {ratio} (ref 0.0–5.0)
Cholesterol, Total: 98 mg/dL — ABNORMAL LOW (ref 100–199)
HDL: 32 mg/dL — ABNORMAL LOW (ref >39)
LDL Chol Calc (NIH): 47 mg/dL (ref 0–99)
Triglycerides: 100 mg/dL (ref 0–149)
VLDL Cholesterol Cal: 19 mg/dL (ref 5–40)

## 2023-07-28 LAB — CMP14+EGFR
ALT: 15 [IU]/L (ref 0–44)
AST: 17 [IU]/L (ref 0–40)
Albumin: 4.1 g/dL (ref 3.8–4.8)
Alkaline Phosphatase: 88 [IU]/L (ref 44–121)
BUN/Creatinine Ratio: 19 (ref 10–24)
BUN: 19 mg/dL (ref 8–27)
Bilirubin Total: 0.4 mg/dL (ref 0.0–1.2)
CO2: 24 mmol/L (ref 20–29)
Calcium: 9.7 mg/dL (ref 8.6–10.2)
Chloride: 101 mmol/L (ref 96–106)
Creatinine, Ser: 1 mg/dL (ref 0.76–1.27)
Globulin, Total: 3.4 g/dL (ref 1.5–4.5)
Glucose: 150 mg/dL — ABNORMAL HIGH (ref 70–99)
Potassium: 4.5 mmol/L (ref 3.5–5.2)
Sodium: 141 mmol/L (ref 134–144)
Total Protein: 7.5 g/dL (ref 6.0–8.5)
eGFR: 78 mL/min/{1.73_m2} (ref >59)

## 2023-07-28 LAB — TSH: TSH: 2.69 u[IU]/mL (ref 0.450–4.500)

## 2023-07-31 ENCOUNTER — Telehealth: Payer: Self-pay | Admitting: Cardiology

## 2023-07-31 DIAGNOSIS — I480 Paroxysmal atrial fibrillation: Secondary | ICD-10-CM

## 2023-07-31 NOTE — Telephone Encounter (Signed)
 Patient identification verified by 2 forms. Matthew Rail, RN    Called and spoke to patients daughter Matthew May states:   -patient saw PCP on Friday 2/21  -PCP concerned about tachycardia   -PCP states patient is in afib   -patient takes eliquis twice daily   -this morning heart rate was 70   -checked heart rate with pulse ox, it was not jumping around  [place] denies:   -chest pain   -SOB/difficulty breathing  Patient scheduled for OV 3/3 Reviewed ED warning signs/precautions  Becky verbalized understanding, no questions at this time

## 2023-07-31 NOTE — Telephone Encounter (Signed)
 Patient c/o Palpitations:  STAT if patient reporting lightheadedness, shortness of breath, or chest pain  How long have you had palpitations/irregular HR/ Afib? Are you having the symptoms now? Friday, yes  Are you currently experiencing lightheadedness, SOB or CP? dizzy  Do you have a history of afib (atrial fibrillation) or irregular heart rhythm? no  Have you checked your BP or HR? (document readings if available): 70, 61, 150, 72  Are you experiencing any other symptoms? no

## 2023-07-31 NOTE — Telephone Encounter (Signed)
 Pt daughter returning call

## 2023-07-31 NOTE — Telephone Encounter (Signed)
 Attempted to call patient, no answer left message requesting a call back.

## 2023-08-01 ENCOUNTER — Ambulatory Visit: Payer: Medicare Other | Attending: Cardiology

## 2023-08-01 DIAGNOSIS — I480 Paroxysmal atrial fibrillation: Secondary | ICD-10-CM

## 2023-08-01 NOTE — Telephone Encounter (Signed)
 Spoke with pt's daughter, Kriste Basque (ok per DPR). Patient identification verified by 2 forms. Dr. Jenene Slicker recommendations given to order 3 day zio monitor. Orders placed. At this time pt has expressed to his daughter that he does not want to travel all the way to [facility] to be seen. Kriste Basque has asked that we cancel appointment for Monday, with Wyn Forster, NP. Appointment cancelled. They will proceed with the monitor. Instructions given for monitor. No additional questions at this time.

## 2023-08-01 NOTE — Telephone Encounter (Signed)
 Rollene Rotunda, MD  You38 minutes ago (7:33 AM)    I would like to have him wear a 3 day Zio to see how fast he is going in atrial fib.   Attempted to call patient, no answer left message requesting a call back.

## 2023-08-01 NOTE — Telephone Encounter (Signed)
Daughter is returning call. Please advise

## 2023-08-01 NOTE — Progress Notes (Unsigned)
 Enrolled for [employer] to mail a ZIO XT long term holter monitor to the patients address on file.

## 2023-08-06 ENCOUNTER — Ambulatory Visit: Payer: Medicare Other | Admitting: Emergency Medicine

## 2023-08-13 ENCOUNTER — Encounter (HOSPITAL_COMMUNITY): Payer: Self-pay

## 2023-08-13 ENCOUNTER — Other Ambulatory Visit: Payer: Self-pay

## 2023-08-13 ENCOUNTER — Ambulatory Visit (HOSPITAL_COMMUNITY): Admit: 2023-08-13 | Admitting: Cardiology

## 2023-08-13 ENCOUNTER — Encounter (HOSPITAL_COMMUNITY): Admission: EM | Disposition: E | Payer: Self-pay | Source: Home / Self Care | Attending: Internal Medicine

## 2023-08-13 ENCOUNTER — Inpatient Hospital Stay (HOSPITAL_COMMUNITY)

## 2023-08-13 ENCOUNTER — Other Ambulatory Visit: Payer: Self-pay | Admitting: Family

## 2023-08-13 DIAGNOSIS — I700 Atherosclerosis of aorta: Secondary | ICD-10-CM | POA: Diagnosis not present

## 2023-08-13 DIAGNOSIS — R0989 Other specified symptoms and signs involving the circulatory and respiratory systems: Secondary | ICD-10-CM | POA: Diagnosis not present

## 2023-08-13 DIAGNOSIS — R34 Anuria and oliguria: Secondary | ICD-10-CM | POA: Diagnosis not present

## 2023-08-13 DIAGNOSIS — I214 Non-ST elevation (NSTEMI) myocardial infarction: Principal | ICD-10-CM | POA: Diagnosis present

## 2023-08-13 DIAGNOSIS — Z6835 Body mass index (BMI) 35.0-35.9, adult: Secondary | ICD-10-CM

## 2023-08-13 DIAGNOSIS — I5023 Acute on chronic systolic (congestive) heart failure: Secondary | ICD-10-CM

## 2023-08-13 DIAGNOSIS — L89302 Pressure ulcer of unspecified buttock, stage 2: Secondary | ICD-10-CM | POA: Diagnosis not present

## 2023-08-13 DIAGNOSIS — E871 Hypo-osmolality and hyponatremia: Secondary | ICD-10-CM | POA: Diagnosis not present

## 2023-08-13 DIAGNOSIS — J811 Chronic pulmonary edema: Secondary | ICD-10-CM | POA: Diagnosis not present

## 2023-08-13 DIAGNOSIS — I5020 Unspecified systolic (congestive) heart failure: Secondary | ICD-10-CM | POA: Diagnosis not present

## 2023-08-13 DIAGNOSIS — I5021 Acute systolic (congestive) heart failure: Secondary | ICD-10-CM | POA: Diagnosis not present

## 2023-08-13 DIAGNOSIS — I484 Atypical atrial flutter: Secondary | ICD-10-CM

## 2023-08-13 DIAGNOSIS — I272 Pulmonary hypertension, unspecified: Secondary | ICD-10-CM | POA: Diagnosis not present

## 2023-08-13 DIAGNOSIS — I2583 Coronary atherosclerosis due to lipid rich plaque: Secondary | ICD-10-CM | POA: Diagnosis not present

## 2023-08-13 DIAGNOSIS — I5043 Acute on chronic combined systolic (congestive) and diastolic (congestive) heart failure: Secondary | ICD-10-CM | POA: Diagnosis not present

## 2023-08-13 DIAGNOSIS — I25118 Atherosclerotic heart disease of native coronary artery with other forms of angina pectoris: Secondary | ICD-10-CM | POA: Diagnosis not present

## 2023-08-13 DIAGNOSIS — Z833 Family history of diabetes mellitus: Secondary | ICD-10-CM

## 2023-08-13 DIAGNOSIS — I110 Hypertensive heart disease with heart failure: Secondary | ICD-10-CM | POA: Diagnosis present

## 2023-08-13 DIAGNOSIS — I5082 Biventricular heart failure: Secondary | ICD-10-CM | POA: Diagnosis present

## 2023-08-13 DIAGNOSIS — N170 Acute kidney failure with tubular necrosis: Secondary | ICD-10-CM | POA: Diagnosis not present

## 2023-08-13 DIAGNOSIS — J9811 Atelectasis: Secondary | ICD-10-CM | POA: Diagnosis not present

## 2023-08-13 DIAGNOSIS — E119 Type 2 diabetes mellitus without complications: Secondary | ICD-10-CM

## 2023-08-13 DIAGNOSIS — I878 Other specified disorders of veins: Secondary | ICD-10-CM | POA: Diagnosis present

## 2023-08-13 DIAGNOSIS — G9341 Metabolic encephalopathy: Secondary | ICD-10-CM

## 2023-08-13 DIAGNOSIS — I4589 Other specified conduction disorders: Secondary | ICD-10-CM | POA: Diagnosis present

## 2023-08-13 DIAGNOSIS — G9340 Encephalopathy, unspecified: Secondary | ICD-10-CM | POA: Diagnosis not present

## 2023-08-13 DIAGNOSIS — Z953 Presence of xenogenic heart valve: Secondary | ICD-10-CM

## 2023-08-13 DIAGNOSIS — T82218D Other mechanical complication of coronary artery bypass graft, subsequent encounter: Secondary | ICD-10-CM

## 2023-08-13 DIAGNOSIS — I6782 Cerebral ischemia: Secondary | ICD-10-CM | POA: Diagnosis not present

## 2023-08-13 DIAGNOSIS — I213 ST elevation (STEMI) myocardial infarction of unspecified site: Secondary | ICD-10-CM | POA: Diagnosis not present

## 2023-08-13 DIAGNOSIS — R570 Cardiogenic shock: Secondary | ICD-10-CM | POA: Diagnosis not present

## 2023-08-13 DIAGNOSIS — I517 Cardiomegaly: Secondary | ICD-10-CM | POA: Diagnosis not present

## 2023-08-13 DIAGNOSIS — I255 Ischemic cardiomyopathy: Secondary | ICD-10-CM | POA: Diagnosis present

## 2023-08-13 DIAGNOSIS — Z515 Encounter for palliative care: Secondary | ICD-10-CM

## 2023-08-13 DIAGNOSIS — Z452 Encounter for adjustment and management of vascular access device: Secondary | ICD-10-CM | POA: Diagnosis not present

## 2023-08-13 DIAGNOSIS — Z7984 Long term (current) use of oral hypoglycemic drugs: Secondary | ICD-10-CM

## 2023-08-13 DIAGNOSIS — I2510 Atherosclerotic heart disease of native coronary artery without angina pectoris: Secondary | ICD-10-CM | POA: Diagnosis not present

## 2023-08-13 DIAGNOSIS — E875 Hyperkalemia: Secondary | ICD-10-CM | POA: Diagnosis not present

## 2023-08-13 DIAGNOSIS — Z66 Do not resuscitate: Secondary | ICD-10-CM | POA: Diagnosis present

## 2023-08-13 DIAGNOSIS — J90 Pleural effusion, not elsewhere classified: Secondary | ICD-10-CM | POA: Diagnosis not present

## 2023-08-13 DIAGNOSIS — Z951 Presence of aortocoronary bypass graft: Secondary | ICD-10-CM | POA: Diagnosis not present

## 2023-08-13 DIAGNOSIS — N390 Urinary tract infection, site not specified: Secondary | ICD-10-CM | POA: Diagnosis not present

## 2023-08-13 DIAGNOSIS — I4891 Unspecified atrial fibrillation: Secondary | ICD-10-CM | POA: Insufficient documentation

## 2023-08-13 DIAGNOSIS — R54 Age-related physical debility: Secondary | ICD-10-CM | POA: Diagnosis present

## 2023-08-13 DIAGNOSIS — I200 Unstable angina: Secondary | ICD-10-CM | POA: Diagnosis not present

## 2023-08-13 DIAGNOSIS — I252 Old myocardial infarction: Secondary | ICD-10-CM

## 2023-08-13 DIAGNOSIS — J9601 Acute respiratory failure with hypoxia: Secondary | ICD-10-CM | POA: Diagnosis not present

## 2023-08-13 DIAGNOSIS — K838 Other specified diseases of biliary tract: Secondary | ICD-10-CM | POA: Diagnosis not present

## 2023-08-13 DIAGNOSIS — Z79899 Other long term (current) drug therapy: Secondary | ICD-10-CM

## 2023-08-13 DIAGNOSIS — J449 Chronic obstructive pulmonary disease, unspecified: Secondary | ICD-10-CM | POA: Diagnosis present

## 2023-08-13 DIAGNOSIS — L8990 Pressure ulcer of unspecified site, unspecified stage: Secondary | ICD-10-CM

## 2023-08-13 DIAGNOSIS — G928 Other toxic encephalopathy: Secondary | ICD-10-CM | POA: Diagnosis not present

## 2023-08-13 DIAGNOSIS — I4892 Unspecified atrial flutter: Secondary | ICD-10-CM | POA: Diagnosis present

## 2023-08-13 DIAGNOSIS — R6889 Other general symptoms and signs: Secondary | ICD-10-CM | POA: Diagnosis not present

## 2023-08-13 DIAGNOSIS — Z7901 Long term (current) use of anticoagulants: Secondary | ICD-10-CM

## 2023-08-13 DIAGNOSIS — Z87891 Personal history of nicotine dependence: Secondary | ICD-10-CM

## 2023-08-13 DIAGNOSIS — I480 Paroxysmal atrial fibrillation: Secondary | ICD-10-CM | POA: Diagnosis present

## 2023-08-13 DIAGNOSIS — K810 Acute cholecystitis: Secondary | ICD-10-CM | POA: Diagnosis present

## 2023-08-13 DIAGNOSIS — F05 Delirium due to known physiological condition: Secondary | ICD-10-CM | POA: Diagnosis not present

## 2023-08-13 DIAGNOSIS — I2582 Chronic total occlusion of coronary artery: Secondary | ICD-10-CM | POA: Diagnosis present

## 2023-08-13 DIAGNOSIS — R918 Other nonspecific abnormal finding of lung field: Secondary | ICD-10-CM | POA: Diagnosis not present

## 2023-08-13 DIAGNOSIS — Y832 Surgical operation with anastomosis, bypass or graft as the cause of abnormal reaction of the patient, or of later complication, without mention of misadventure at the time of the procedure: Secondary | ICD-10-CM | POA: Diagnosis present

## 2023-08-13 DIAGNOSIS — I5033 Acute on chronic diastolic (congestive) heart failure: Secondary | ICD-10-CM | POA: Diagnosis not present

## 2023-08-13 DIAGNOSIS — R5381 Other malaise: Secondary | ICD-10-CM | POA: Diagnosis present

## 2023-08-13 DIAGNOSIS — I499 Cardiac arrhythmia, unspecified: Secondary | ICD-10-CM | POA: Diagnosis not present

## 2023-08-13 DIAGNOSIS — Z8249 Family history of ischemic heart disease and other diseases of the circulatory system: Secondary | ICD-10-CM

## 2023-08-13 DIAGNOSIS — Z79890 Hormone replacement therapy (postmenopausal): Secondary | ICD-10-CM

## 2023-08-13 DIAGNOSIS — I25119 Atherosclerotic heart disease of native coronary artery with unspecified angina pectoris: Secondary | ICD-10-CM | POA: Diagnosis not present

## 2023-08-13 DIAGNOSIS — R079 Chest pain, unspecified: Secondary | ICD-10-CM | POA: Diagnosis not present

## 2023-08-13 DIAGNOSIS — R072 Precordial pain: Secondary | ICD-10-CM | POA: Diagnosis present

## 2023-08-13 DIAGNOSIS — Z888 Allergy status to other drugs, medicaments and biological substances status: Secondary | ICD-10-CM

## 2023-08-13 DIAGNOSIS — Z743 Need for continuous supervision: Secondary | ICD-10-CM | POA: Diagnosis not present

## 2023-08-13 DIAGNOSIS — I447 Left bundle-branch block, unspecified: Secondary | ICD-10-CM | POA: Diagnosis present

## 2023-08-13 DIAGNOSIS — E7800 Pure hypercholesterolemia, unspecified: Secondary | ICD-10-CM | POA: Diagnosis present

## 2023-08-13 DIAGNOSIS — N179 Acute kidney failure, unspecified: Secondary | ICD-10-CM | POA: Diagnosis not present

## 2023-08-13 DIAGNOSIS — R0602 Shortness of breath: Principal | ICD-10-CM

## 2023-08-13 DIAGNOSIS — E669 Obesity, unspecified: Secondary | ICD-10-CM | POA: Diagnosis present

## 2023-08-13 DIAGNOSIS — K828 Other specified diseases of gallbladder: Secondary | ICD-10-CM | POA: Diagnosis present

## 2023-08-13 DIAGNOSIS — R109 Unspecified abdominal pain: Secondary | ICD-10-CM | POA: Diagnosis not present

## 2023-08-13 DIAGNOSIS — E878 Other disorders of electrolyte and fluid balance, not elsewhere classified: Secondary | ICD-10-CM | POA: Diagnosis not present

## 2023-08-13 DIAGNOSIS — I440 Atrioventricular block, first degree: Secondary | ICD-10-CM | POA: Diagnosis present

## 2023-08-13 DIAGNOSIS — I509 Heart failure, unspecified: Secondary | ICD-10-CM | POA: Diagnosis not present

## 2023-08-13 LAB — LIPID PANEL
Cholesterol: 74 mg/dL (ref 0–200)
HDL: 24 mg/dL — ABNORMAL LOW (ref >40)
LDL Cholesterol: 34 mg/dL (ref 0–99)
Total CHOL/HDL Ratio: 3.1 ratio
Triglycerides: 79 mg/dL (ref <150)
VLDL: 16 mg/dL (ref 0–40)

## 2023-08-13 LAB — BASIC METABOLIC PANEL
Anion gap: 11 (ref 5–15)
BUN: 17 mg/dL (ref 8–23)
CO2: 25 mmol/L (ref 22–32)
Calcium: 8.6 mg/dL — ABNORMAL LOW (ref 8.9–10.3)
Chloride: 100 mmol/L (ref 98–111)
Creatinine, Ser: 1.01 mg/dL (ref 0.61–1.24)
GFR, Estimated: >60 mL/min (ref >60)
Glucose, Bld: 157 mg/dL — ABNORMAL HIGH (ref 70–99)
Potassium: 3.9 mmol/L (ref 3.5–5.1)
Sodium: 136 mmol/L (ref 135–145)

## 2023-08-13 LAB — TROPONIN I (HIGH SENSITIVITY)
Troponin I (High Sensitivity): 2774 ng/L (ref <18)
Troponin I (High Sensitivity): 2562 ng/L (ref <18)

## 2023-08-13 LAB — CBC
HCT: 38 % — ABNORMAL LOW (ref 39.0–52.0)
Hemoglobin: 11.8 g/dL — ABNORMAL LOW (ref 13.0–17.0)
MCH: 26.9 pg (ref 26.0–34.0)
MCHC: 31.1 g/dL (ref 30.0–36.0)
MCV: 86.6 fL (ref 80.0–100.0)
Platelets: 164 10*3/uL (ref 150–400)
RBC: 4.39 MIL/uL (ref 4.22–5.81)
RDW: 13.7 % (ref 11.5–15.5)
WBC: 12.1 10*3/uL — ABNORMAL HIGH (ref 4.0–10.5)
nRBC: 0 % (ref 0.0–0.2)

## 2023-08-13 LAB — CBG MONITORING, ED: Glucose-Capillary: 141 mg/dL — ABNORMAL HIGH (ref 70–99)

## 2023-08-13 LAB — HEPARIN LEVEL (UNFRACTIONATED)
Heparin Unfractionated: >1.1 [IU]/mL — ABNORMAL HIGH (ref 0.30–0.70)
Heparin Unfractionated: >1.1 [IU]/mL — ABNORMAL HIGH (ref 0.30–0.70)

## 2023-08-13 LAB — BRAIN NATRIURETIC PEPTIDE: B Natriuretic Peptide: 324.7 pg/mL — ABNORMAL HIGH (ref 0.0–100.0)

## 2023-08-13 SURGERY — LEFT HEART CATH AND CORONARY ANGIOGRAPHY
Anesthesia: LOCAL

## 2023-08-13 MED ORDER — HEPARIN (PORCINE) 25000 UT/250ML-% IV SOLN
1200.0000 [IU]/h | INTRAVENOUS | Status: DC
  Administered 2023-08-13: 1200 [IU]/h via INTRAVENOUS
  Filled 2023-08-13: qty 250

## 2023-08-13 MED ORDER — AMIODARONE LOAD VIA INFUSION
150.0000 mg | Freq: Once | INTRAVENOUS | Status: AC
  Administered 2023-08-13: 150 mg via INTRAVENOUS
  Filled 2023-08-13: qty 83.34

## 2023-08-13 MED ORDER — DILTIAZEM HCL 25 MG/5ML IV SOLN: 5.0000 mg | Freq: Once | INTRAVENOUS | Status: DC

## 2023-08-13 MED ORDER — METOPROLOL TARTRATE 50 MG PO TABS
50.0000 mg | ORAL_TABLET | Freq: Two times a day (BID) | ORAL | Status: DC
  Administered 2023-08-13 – 2023-08-14 (×3): 50 mg via ORAL
  Filled 2023-08-13 (×2): qty 2
  Filled 2023-08-13: qty 1

## 2023-08-13 MED ORDER — ASPIRIN 300 MG RE SUPP: 300.0000 mg | RECTAL | Status: AC

## 2023-08-13 MED ORDER — AMIODARONE HCL IN DEXTROSE 360-4.14 MG/200ML-% IV SOLN
30.0000 mg/h | INTRAVENOUS | Status: DC
  Administered 2023-08-14 (×2): 30 mg/h via INTRAVENOUS
  Filled 2023-08-13: qty 200

## 2023-08-13 MED ORDER — HEPARIN BOLUS VIA INFUSION
4000.0000 [IU] | Freq: Once | INTRAVENOUS | Status: AC
  Administered 2023-08-13: 4000 [IU] via INTRAVENOUS
  Filled 2023-08-13: qty 4000

## 2023-08-13 MED ORDER — ONDANSETRON HCL 4 MG/2ML IJ SOLN: 4.0000 mg | Freq: Four times a day (QID) | INTRAMUSCULAR | Status: DC | PRN

## 2023-08-13 MED ORDER — ASPIRIN 81 MG PO CHEW
324.0000 mg | CHEWABLE_TABLET | ORAL | Status: AC
  Administered 2023-08-13: 324 mg via ORAL
  Filled 2023-08-13: qty 4

## 2023-08-13 MED ORDER — HEPARIN (PORCINE) 25000 UT/250ML-% IV SOLN
1700.0000 [IU]/h | INTRAVENOUS | Status: DC
  Administered 2023-08-13: 1000 [IU]/h via INTRAVENOUS
  Administered 2023-08-15: 1800 [IU]/h via INTRAVENOUS
  Administered 2023-08-15: 1600 [IU]/h via INTRAVENOUS
  Filled 2023-08-13 (×3): qty 250

## 2023-08-13 MED ORDER — AMIODARONE HCL IN DEXTROSE 360-4.14 MG/200ML-% IV SOLN
60.0000 mg/h | INTRAVENOUS | Status: DC
  Administered 2023-08-13: 60 mg/h via INTRAVENOUS
  Filled 2023-08-13 (×2): qty 200

## 2023-08-13 MED ORDER — ASPIRIN 81 MG PO TBEC
81.0000 mg | DELAYED_RELEASE_TABLET | Freq: Every day | ORAL | Status: DC
  Administered 2023-08-14 – 2023-08-16 (×3): 81 mg via ORAL
  Filled 2023-08-13 (×3): qty 1

## 2023-08-13 MED ORDER — ACETAMINOPHEN 325 MG PO TABS
650.0000 mg | ORAL_TABLET | ORAL | Status: DC | PRN
  Administered 2023-08-18: 650 mg via ORAL
  Filled 2023-08-13 (×2): qty 2

## 2023-08-13 NOTE — ED Notes (Signed)
EDP aware of pt HR.  

## 2023-08-13 NOTE — ED Notes (Signed)
 This nurse informed the cardio MD that is at bedside that the repeat heparin level is 1.10. Provider said to pause the heparin infusion for then restart the infusion at 1,000 unites.

## 2023-08-13 NOTE — ED Notes (Signed)
 MD Jacinto Halim made aware of patient's current HR.

## 2023-08-13 NOTE — ED Triage Notes (Signed)
 Pt to ED via [facility]  c/o CP x 1 week. Activated code STEMI, evaluated by cath team and canceled by MD. #[id number]- No medications given by EMS   LE swelling.   Last VS: bp 140/71, 100%RA, HR140

## 2023-08-13 NOTE — Progress Notes (Addendum)
 Patient Name: Matthew May Date of Encounter: 08/13/2023 [facility] Cardiologist: Rollene Rotunda, MD   Interval Summary  .    CC: Chest pain, Generalized weakness  Mr. Nichalos Brenton is a 78 year old Caucasian male patient with history of CABG in 2011 with LIMA to LAD, SVG to OM and sequential SVG to PDA and PL branch, due to severe aortic stenosis underwent TAVR on 01/20/2020 (29 mm EDWARDS LIFESCIENCES ), has history of COPD, hypertension, hypercholesterolemia, type 2 diabetes mellitus, PAF had been doing well until 2 weeks ago started having severe precordial pain described as tightness to heaviness and thought that he was going to die that started 2 weeks ago.  He took a total of 3 sublingual nitroglycerin over the past 2 weeks ago with mild relief of chest discomfort.  Chest pain started getting better over the past 1 week, and since for the past 1 week patient has noticed mild generalized weakness, lack of appetite.  He has not had any further chest pain.  In view of continued worsening fatigue, not feeling well, his wife coaxed him to call EMS.  He was picked up by EMS and STEMI was activated in view of ST elevations noted in V1-V4.  I met the patient in the emergency room.  He is presently not having any chest pain.  States that he just feels weak and tired.  Denies nausea, vomiting, dyspnea, PND or orthopnea or leg edema.  Vital Signs .   There were no vitals filed for this visit. No intake or output data in the 24 hours ending 08/13/23 1127    07/27/2023    9:29 AM 04/11/2023    9:26 AM 03/19/2023   11:55 AM  Last 3 Weights  Weight (lbs) 228 lb 3.2 oz 237 lb 232 lb 6.4 oz  Weight (kg) 103.511 kg 107.502 kg 105.416 kg     Telemetry/ECG    EKG from EMS reviewed.  EKG 08/13/2023: Atrial flutter with 2: 1 conduction, ventricular rate 148 bpm.  Completed anteroseptal infarct old.  Nonspecific T wave abnormality.- Personally Reviewed  ECHOCARDIOGRAM COMPLETE 06/08/2022   1. Left ventricular ejection fraction, by estimation, is 70 to 75%. The left ventricle has hyperdynamic function. The left ventricle has no regional wall motion abnormalities. There is mild asymmetric left ventricular hypertrophy of the basal-septal segment. Left ventricular diastolic parameters are indeterminate. 2. Right ventricular systolic function is mildly reduced. The right ventricular size is normal. Tricuspid regurgitation signal is inadequate for assessing PA pressure. 3. Right atrial size was mildly dilated. 4. The mitral valve is normal in structure. No evidence of mitral valve regurgitation. The mean mitral valve gradient is 8.0 mmHg with average heart rate of 93 bpm. Suspect elevated gradient through mitral valve due to high flow state and not mitral stenosis, MVA normal (3.3 cm^2) by continuity equation 5. The aortic valve has been repaired/replaced. Aortic valve regurgitation is not visualized. 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 Echo findings are consistent with normal structure and function of the aortic valve prosthesis. Aortic valve mean gradient measures 7.0 mmHg.  RIGHT/LEFT HEART CATH AND CORONARY/GRAFT ANGIOGRAPHY  12/30/2019    Review of Systems  Constitutional: Positive for decreased appetite and malaise/fatigue.  Cardiovascular:  Positive for chest pain (2 weeks ago lasted 1 week) and dyspnea on exertion. Negative for leg swelling.    Physical Exam .   Physical Exam Constitutional:      General: He is  not in acute distress. Neck:     Vascular: No carotid bruit or JVD.  Cardiovascular:     Rate and Rhythm: Regular rhythm. Tachycardia present.     Pulses: Intact distal pulses.          Dorsalis pedis pulses are 0 on the left side.       Posterior tibial pulses are 0 on the right side and 0 on the left side.     Heart sounds: No murmur heard. Pulmonary:     Effort: Pulmonary effort is normal.     Breath  sounds: Normal breath sounds.  Abdominal:     General: Bowel sounds are normal.     Palpations: Abdomen is soft.  Musculoskeletal:     Right lower leg: No edema.     Left lower leg: No edema.  Skin:    Capillary Refill: Capillary refill takes less than 2 seconds.    Assessment & Plan .     1.  Most probably completed infarct in the anteroseptal region with Q waves and persistent ST elevation in V1-V4 with nonspecific T abnormality. Unstable angina and other form of angina  2.  Atrial flutter with 2: 1 conduction. 3.  CAD SP CABG in 2011 with LIMA to LAD, SVG to OM1, SVG to PDA and PL branch of RCA. 4.  History of TAVR 2021 (TAVR on 01/20/2020 (29 mm EDWARDS LIFESCIENCES).  Recommendation: Cancel STEMI.  Patient needs admission to the hospital for further evaluation and management of tachyarrhythmia, stabilization of medical therapy and evaluate his LV systolic function.  Suspect he probably completed his anteroseptal infarct 2 weeks ago and has been having weakness and fatigue over the past 2 weeks, no further chest pain or the past 1 week.  Patient wishes to be DNR. Discussed with patient and also daughter and wife.  He is aware of the decision made.  For questions or updates, please contact [facility] Please consult [email or web address] for contact info under    Yates Decamp, MD, [facility] 08/13/2023, 11:37 AM [facility] [address] [place], [place] [address] Phone: [phone]. Fax:  [phone]

## 2023-08-13 NOTE — Progress Notes (Signed)
 PHARMACY - ANTICOAGULATION CONSULT NOTE  Pharmacy Consult for [facility] IV Infusion Indication: chest pain/ACS  Allergies  Allergen Reactions   Prednisone Other (See Comments)    Pt states "sugar went to 580"   Januvia [Sitagliptin] Nausea And Vomiting   Jardiance [Empagliflozin] Rash    Tolerated Farxiga samples   Lisinopril Cough    Patient Measurements: Height: 5\' 7"  (170.2 cm) Weight: 103.4 kg (228 lb) IBW/kg (Calculated) : 66.1 Heparin Dosing Weight: 103.4 kg  Vital Signs: Temp: 98.1 F (36.7 C) (03/10 1139) Temp Source: Oral (03/10 1139) BP: 115/87 (03/10 1139) Pulse Rate: 139 (03/10 1139)  Labs: No results for input(s): "HGB", "HCT", "PLT", "APTT", "LABPROT", "INR", "HEPARINUNFRC", "HEPRLOWMOCWT", "CREATININE", "CKTOTAL", "CKMB", "TROPONINIHS" in the last 72 hours.  Estimated Creatinine Clearance: 72 mL/min (by C-G formula based on SCr of 1 mg/dL).   Medical History: Past Medical History:  Diagnosis Date   Atrial fibrillation (HCC)    CAD (coronary artery disease)    a. 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   TAVR on 01/20/2020 (29 mm EDWARDS LIFESCIENCES )   HLD (hyperlipidemia)    HTN (hypertension)    Obesity    Renal insufficiency    Subclavian artery stenosis, right (HCC)    based upon carotid U/S done 11/2012    Medications:  (Not in a hospital admission)  Scheduled:   [START ON 08/14/2023] aspirin EC  81 mg Oral Daily   Infusions:   heparin 1,200 Units/hr (08/13/23 1309)   PRN: acetaminophen, ondansetron (ZOFRAN) IV Anti-infectives (From admission, onward)    None       Assessment: 37 YOM with PMH HLD, HTN,  CAD, AF, COPD. Presented to ED with complaints of SOB and chest pain. EKG showed ST elevations. However code STEMI was cancelled and patient will be medically managed for now. No anticoagulation PTA.  Goal of Therapy:  Heparin level 0.3-0.7 units/ml Monitor platelets by anticoagulation protocol: Yes   Plan:  Give 4000 units bolus x 1 Start heparin infusion at 1200 units/hr Check anti-Xa level in 6 hours and daily while on heparin Continue to monitor H&H and platelets  Wilmer Floor 08/13/2023,12:13 PM

## 2023-08-13 NOTE — Progress Notes (Signed)
 Patient will heart appears to be atrial flutter with 2: 1 conduction and rapid ventricular response.  Will go ahead start him on IV amiodarone for now along with metoprolol tartrate 25 mg twice daily.  Patient was on Eliquis as well 5 mg twice daily at home, suspect related to paroxysmal atrial fibrillation.  Will hold off on Eliquis for now in case he needs cardiac catheterization.:     Current Facility-Administered Medications:    acetaminophen (TYLENOL) tablet 650 mg, 650 mg, Oral, Q4H PRN, Yates Decamp, MD   amiodarone (NEXTERONE) 1.8 mg/mL load via infusion 150 mg, 150 mg, Intravenous, Once **FOLLOWED BY** amiodarone (NEXTERONE PREMIX) 360-4.14 MG/200ML-% (1.8 mg/mL) IV infusion, 60 mg/hr, Intravenous, Continuous **FOLLOWED BY** [START ON 08/14/2023] amiodarone (NEXTERONE PREMIX) 360-4.14 MG/200ML-% (1.8 mg/mL) IV infusion, 30 mg/hr, Intravenous, Continuous, Yates Decamp, MD   Melene Muller ON 08/14/2023] aspirin EC tablet 81 mg, 81 mg, Oral, Daily, Yates Decamp, MD   heparin ADULT infusion 100 units/mL (25000 units/224mL), 1,200 Units/hr, Intravenous, Continuous, Wilmer Floor, [facility], Last Rate: 12 mL/hr at 08/13/23 1349, 1,200 Units/hr at 08/13/23 1349   ondansetron (ZOFRAN) injection 4 mg, 4 mg, Intravenous, Q6H PRN, Yates Decamp, MD  Current Outpatient Medications:    acetaminophen (TYLENOL) 325 MG tablet, Take 2 tablets (650 mg total) by mouth every 6 (six) hours as needed for mild pain (or Fever >/= 101)., Disp: , Rfl:    albuterol (VENTOLIN HFA) 108 (90 Base) MCG/ACT inhaler, Inhale 1-2 puffs into the lungs every 6 (six) hours as needed for wheezing or shortness of breath., Disp: 9 g, Rfl: 2   Budeson-Glycopyrrol-Formoterol (BREZTRI AEROSPHERE) 160-9-4.8 MCG/ACT AERO, Inhale 2 puffs into the lungs 2 (two) times daily., Disp: 32.1 g, Rfl: 11   dapagliflozin propanediol (FARXIGA) 10 MG TABS tablet, Take 1 tablet (10 mg total) by mouth daily. (Patient taking differently: Take 10 mg by mouth in the morning.),  Disp: 90 tablet, Rfl: 2   ELIQUIS 5 MG TABS tablet, Take 1 tablet (5 mg total) by mouth 2 (two) times daily., Disp: 180 tablet, Rfl: 0   gabapentin (NEURONTIN) 100 MG capsule, Take 1 capsule (100 mg total) by mouth 3 (three) times daily., Disp: 300 capsule, Rfl: 0   glimepiride (AMARYL) 4 MG tablet, Take 1 tablet (4 mg total) by mouth daily with breakfast., Disp: 90 tablet, Rfl: 1   levothyroxine (SYNTHROID) 150 MCG tablet, Take 1 tablet (150 mcg total) by mouth daily., Disp: 90 tablet, Rfl: 3   metFORMIN (GLUCOPHAGE) 1000 MG tablet, Take 1 tablet (1,000 mg total) by mouth 2 (two) times daily., Disp: 200 tablet, Rfl: 0   nitroGLYCERIN (NITROSTAT) 0.4 MG SL tablet, DISSOLVE ONE TABLET UNDER THE TONGUE EVERY 5 MINUTES AS NEEDED FOR CHEST PAIN.  DO NOT EXCEED A TOTAL OF 3 DOSES IN 15 MINUTES, Disp: 25 tablet, Rfl: 1   potassium chloride SA (KLOR-CON M) 20 MEQ tablet, TAKE 1 TABLET BY MOUTH DAILY, Disp: 100 tablet, Rfl: 0   rosuvastatin (CRESTOR) 40 MG tablet, Take 1 tablet (40 mg total) by mouth daily. (Patient taking differently: Take 40 mg by mouth in the morning.), Disp: 90 tablet, Rfl: 3   torsemide (DEMADEX) 20 MG tablet, Take 2 tablets (40 mg total) by mouth daily. (Patient taking differently: Take 40 mg by mouth in the morning.), Disp: 180 tablet, Rfl: 0   Vitamin D, Ergocalciferol, (DRISDOL) 1.25 MG (50000 UNIT) CAPS capsule, Take 1 capsule by mouth once a week (Patient taking differently: Take 50,000 Units by mouth once a  week. On Wednesday), Disp: 12 capsule, Rfl: 0   cetirizine (ZYRTEC) 10 MG tablet, Take 1 tablet (10 mg total) by mouth daily. (Patient not taking: Reported on 07/27/2023), Disp: 90 tablet, Rfl: 1   glucose blood (ONETOUCH VERIO) test strip, CHECK BLOOD SUGAR DAILY Dx E11.9, Disp: 100 strip, Rfl: 3   Lancets (ONETOUCH DELICA PLUS LANCET33G) MISC, CHECK BLOOD SUGAR DAILY Dx E11.9, Disp: 100 each, Rfl: 3

## 2023-08-13 NOTE — ED Notes (Signed)
Cariology at bedside  

## 2023-08-13 NOTE — Progress Notes (Signed)
 PHARMACY - ANTICOAGULATION CONSULT NOTE  Pharmacy Consult for [facility] IV Infusion Indication: chest pain/ACS  Allergies  Allergen Reactions   Prednisone Other (See Comments)    Pt states "sugar went to 580"   Januvia [Sitagliptin] Nausea And Vomiting   Jardiance [Empagliflozin] Rash    Tolerated Farxiga samples   Lisinopril Cough    Patient Measurements: Height: 5\' 7"  (170.2 cm) Weight: 103.4 kg (228 lb) IBW/kg (Calculated) : 66.1 Heparin Dosing Weight: 89 kg  Vital Signs: Temp: 97.9 F (36.6 C) (03/10 1528) Temp Source: Oral (03/10 1528) BP: 110/78 (03/10 2045) Pulse Rate: 84 (03/10 2045)  Labs: Recent Labs    08/13/23 1151 08/13/23 1344 08/13/23 1830 08/13/23 2000  HGB 11.8*  --   --   --   HCT 38.0*  --   --   --   PLT 164  --   --   --   HEPARINUNFRC  --   --  >1.10* >1.10*  CREATININE 1.01  --   --   --   TROPONINIHS 2,774* 2,562*  --   --     Estimated Creatinine Clearance: 71.3 mL/min (by C-G formula based on SCr of 1.01 mg/dL).   Assessment: 84 YOM with PMH HLD, HTN, CAD, AF, COPD. Presented to ED with complaints of SOB and chest pain. EKG showed ST elevations. However code STEMI was cancelled and patient will be medically managed for now. No anticoagulation PTA.  Heparin level >1.1, repeated and still >1.1 (supratherapeutic). Level drawn correctly from arm opposite heparin. No bleeding noted.  RN contacted MD who said to pause for and then restart at 1000 units/hr so will go with that plan for now.   Goal of Therapy:  Heparin level 0.3-0.7 units/ml Monitor platelets by anticoagulation protocol: Yes   Plan:  Hold heparin x 30 min Restart heparin at 1000 units/hr F/u 8 hr heparin level  Christoper Fabian, PharmD, BCPS Please see amion for complete clinical pharmacist phone list  08/13/2023,9:04 PM

## 2023-08-13 NOTE — ED Provider Notes (Signed)
 Matthew May Provider Note   CSN: [id number] Arrival date & time: 08/13/23  1119     History  Chief May  Patient presents with   Chest Pain   Shortness of Breath    Matthew May is a 78 y.o. male patient with past medical history of hyperlipidemia, Matthew May, Matthew May, Matthew May, Matthew May of shortness of breath and chest pain.  Patient reports around 2 weeks ago he had episode of chest pain.  He reports 1 week ago he started having shortness of breath.  Shortness of breath is present at rest and worse with walking.  Reports weakness and fatigue.  Prior to me seeing patient patient was evaluated by STEMI team.  Code STEMI was canceled. He has not received ASA. His heart rate was found to be in 140s.    Chest Pain Associated symptoms: shortness of breath   Shortness of Breath Associated symptoms: chest pain        Home Medications Prior to Admission medications   Medication Sig Start Date End Date Taking? Authorizing Provider  acetaminophen (TYLENOL) 325 MG tablet Take 2 tablets (650 mg total) by mouth every 6 (six) hours as needed for mild pain (or Fever >/= 101). 01/21/20   Leary Roca, PA-C  albuterol (VENTOLIN HFA) 108 (90 Base) MCG/ACT inhaler Inhale 1-2 puffs into the lungs every 6 (six) hours as needed for wheezing or shortness of breath. 07/27/23   [name] A, FNP  Budeson-Glycopyrrol-Formoterol (BREZTRI AEROSPHERE) 160-9-4.8 MCG/ACT AERO Inhale 2 puffs into the lungs 2 (two) times daily. 12/28/22   [name], FNP  cetirizine (ZYRTEC) 10 MG tablet Take 1 tablet (10 mg total) by mouth daily. Patient not taking: Reported on 07/27/2023 04/07/21   [name] A, FNP  dapagliflozin propanediol (FARXIGA) 10 MG TABS tablet Take 1 tablet (10 mg total) by mouth daily. 07/27/23   [name], Christy A, FNP  ELIQUIS 5 MG TABS tablet Take 1 tablet  (5 mg total) by mouth 2 (two) times daily. 07/27/23   [name], FNP  gabapentin (NEURONTIN) 100 MG capsule Take 1 capsule (100 mg total) by mouth 3 (three) times daily. 07/27/23   [name], FNP  glimepiride (AMARYL) 4 MG tablet Take 1 tablet (4 mg total) by mouth daily with breakfast. 07/27/23   [name] A, FNP  glucose blood (ONETOUCH VERIO) test strip CHECK BLOOD SUGAR DAILY Dx E11.9 03/28/23   [name], FNP  Lancets (ONETOUCH DELICA PLUS [place]) MISC CHECK BLOOD SUGAR DAILY Dx E11.9 09/08/22   [name], FNP  levothyroxine (SYNTHROID) 150 MCG tablet Take 1 tablet (150 mcg total) by mouth daily. 07/27/23   [name], FNP  metFORMIN (GLUCOPHAGE) 1000 MG tablet Take 1 tablet (1,000 mg total) by mouth 2 (two) times daily. 07/27/23   [name] A, FNP  nitroGLYCERIN (NITROSTAT) 0.4 MG SL tablet DISSOLVE ONE TABLET UNDER THE TONGUE EVERY 5 MINUTES AS NEEDED FOR CHEST PAIN.  DO NOT EXCEED A TOTAL OF 3 DOSES IN 15 MINUTES 07/27/23   [name], [place] A, FNP  potassium chloride SA (KLOR-CON M) 20 MEQ tablet TAKE 1 TABLET BY MOUTH DAILY 01/15/23   Rollene Rotunda, MD  rosuvastatin (CRESTOR) 40 MG tablet Take 1 tablet (40 mg total) by mouth daily. 04/11/23   Rollene Rotunda, MD  torsemide (DEMADEX) 20 MG tablet Take 2 tablets (40 mg total) by mouth daily.  07/27/23   [name], FNP  Vitamin D, Ergocalciferol, (DRISDOL) 1.25 MG (50000 UNIT) CAPS capsule Take 1 capsule by mouth once a week 06/14/23   [name] A, FNP      Allergies    Prednisone, Januvia [sitagliptin], Jardiance [empagliflozin], and Lisinopril    Review of Systems   Review of Systems  Respiratory:  Positive for shortness of breath.   Cardiovascular:  Positive for chest pain.    Physical Exam Updated Vital Signs BP 115/87   Pulse (!) 139   Temp 98.1 F (36.7 C) (Oral)   Resp 18   Ht 5\' 7"  (1.702 m)   Wt 103.4 kg   SpO2 98%   BMI 35.71 kg/m  Physical Exam Vitals and nursing note  reviewed.  Constitutional:      General: He is not in acute distress.    Appearance: He is not toxic-appearing.  HENT:     Head: Normocephalic and atraumatic.  Eyes:     General: No scleral icterus.    Conjunctiva/sclera: Conjunctivae normal.  Cardiovascular:     Rate and Rhythm: Normal rate and regular rhythm.     Pulses: Normal pulses.     Heart sounds: Normal heart sounds.  Pulmonary:     Effort: Pulmonary effort is normal. No respiratory distress.     Breath sounds: Normal breath sounds. No wheezing.  Chest:     Chest wall: No tenderness.  Abdominal:     General: Abdomen is flat. Bowel sounds are normal.     Palpations: Abdomen is soft.     Tenderness: There is no abdominal tenderness.  Musculoskeletal:     Right lower leg: Edema present.     Left lower leg: Edema present.  Skin:    General: Skin is warm and dry.     Findings: No lesion.  Neurological:     General: No focal deficit present.     Mental Status: He is alert and oriented to person, place, and time. Mental status is at baseline.     ED Results / Procedures / Treatments   Labs (all labs ordered are listed, but only abnormal results are displayed) Labs Reviewed  BASIC METABOLIC PANEL  CBC  BRAIN NATRIURETIC PEPTIDE  LIPID PANEL  CBG MONITORING, ED  TROPONIN I (HIGH SENSITIVITY)    EKG None  Radiology No results found.  Procedures Procedures    Medications Ordered in ED Medications  aspirin chewable tablet 324 mg (has no administration in time range)    Or  aspirin suppository 300 mg (has no administration in time range)  aspirin EC tablet 81 mg (has no administration in time range)  acetaminophen (TYLENOL) tablet 650 mg (has no administration in time range)  ondansetron (ZOFRAN) injection 4 mg (has no administration in time range)    ED Course/ Medical Decision Making/ A&P                                 Medical Decision Making Amount and/or Complexity of Data Reviewed Labs:  ordered. Radiology: ordered.  Risk Decision regarding hospitalization.   Matthew May 78 y.o. presented today for chest pain. Working DDx that I considered at this time includes, but not limited to, ACS, GERD, pe, pna, aortic dissection, pneumothorax, MSK path, anemia, esophageal rupture, CHF exacerbation, valvular disorder, myocarditis, pericarditis, endocarditis, pericardial effusion/cardiac tamponade, pulmonary edema, gastritis/PUD, esophagitis.    Problem List / ED Course /  Critical interventions / Medication management  Patient came in as code STEMI.  Prior to me evaluating patient patient was evaluated by STEMI team.  Dr Jacinto Halim has placed admission orders, labs and ordered ASA. Given cardiology is admitting no intervened ordered by me in ER. Patient is tachycardic in 140s.  Blood pressure stable.  He complains of generalized weakness, shortness of breath.  Lungs clear to auscultation.  Does have lower extremity edema.  Per cardiologist note they will further eval and manage tachyarrhythmia.  I have reviewed the patients home medicines and have made adjustments as needed    Plan: Cardiology has seen patient and placed admission orders.         Final Clinical Impression(s) / ED Diagnoses Final diagnoses:  None    Rx / DC Orders ED Discharge Orders     None         Smitty Knudsen, PA-C 08/13/23 1205    Margarita Grizzle, MD 08/13/23 [phone]

## 2023-08-13 NOTE — ED Notes (Signed)
 MD Jacinto Halim contacted regarding patient's HR

## 2023-08-13 NOTE — Consult Note (Addendum)
 Cardiology Consultation   Patient ID: Matthew May MRN: [id number]; DOB: [birth year]  Admit date: 08/13/2023 Date of Consult: 08/13/2023  PCP:  [name], FNP   [facility] Providers Cardiologist:  Rollene Rotunda, MD        Chief Complaint  Patient presents with   Chest Pain   Shortness of Breath     Patient Profile:   Matthew May is a 78 y.o. male patient with history of CABG in 2011 with LIMA to LAD, SVG to OM and sequential SVG to PDA and PL branch, due to severe aortic stenosis underwent TAVR on 01/20/2020 (29 mm EDWARDS [employer]ESCIENCES ), has history of COPD, hypertension, hypercholesterolemia, type 2 diabetes mellitus, PAF on long term Eliquis had been doing well until 2 weeks ago started having severe precordial pain described as tightness to heaviness and thought that he was going to die that started 2 weeks ago.  He took a total of 3 sublingual nitroglycerin over the past 2 weeks ago with mild relief of chest discomfort.  Chest pain started getting better over the past 1 week, and since for the past 1 week patient has noticed mild generalized weakness, lack of appetite.  He has not had any further chest pain.  In view of continued worsening fatigue, not feeling well, his wife coaxed him to call EMS.  He was picked up by EMS and STEMI was activated in view of ST elevations noted in V1-V4.   I met the patient in the emergency room.  He is presently not having any chest pain.  States that he just feels weak and tired.  Denies nausea, vomiting, dyspnea, PND or orthopnea or leg edema.   Past Medical History:  Diagnosis Date   Atrial fibrillation (HCC)    CAD (coronary artery disease)    a. 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   TAVR on 01/20/2020 (29 mm [employer] )   HLD (hyperlipidemia)    HTN (hypertension)    Obesity    Renal insufficiency    Subclavian artery stenosis, right (HCC)    based upon carotid U/S done 11/2012    Past Surgical History:  Procedure Laterality Date   CATARACT EXTRACTION Left    CORONARY ARTERY BYPASS GRAFT     2011, LIMA to LAD coronary artery, SVG to OM2 branch of lect circumflex coronary artery, and a sequential SVG to psot descening to posterolateral branches to RCA   endoscopic vein harvesting     right leg    HERNIA REPAIR     RIGHT/LEFT HEART CATH AND CORONARY/GRAFT ANGIOGRAPHY N/A 12/31/2017   Procedure: RIGHT/LEFT HEART CATH AND CORONARY/GRAFT ANGIOGRAPHY;  Surgeon: Marykay Lex, MD;  Location: [facility] INVASIVE CV LAB;  Service: Cardiovascular;  Laterality: N/A;   RIGHT/LEFT HEART CATH AND CORONARY/GRAFT ANGIOGRAPHY N/A 12/30/2019   Procedure: RIGHT/LEFT HEART CATH AND CORONARY/GRAFT ANGIOGRAPHY;  Surgeon: Lyn Records, MD;  Location: [facility] INVASIVE CV LAB;  Service: Cardiovascular;  Laterality: N/A;   TEE WITHOUT CARDIOVERSION N/A 01/20/2020   Procedure: TRANSESOPHAGEAL ECHOCARDIOGRAM (TEE);  Surgeon: Tonny Bollman, MD;  Location: [facility] OR;  Service: Open Heart Surgery;  Laterality:  N/A;   TONSILLECTOMY     TRANSCATHETER AORTIC VALVE REPLACEMENT, TRANSFEMORAL N/A 01/20/2020   Procedure: TRANSCATHETER AORTIC VALVE REPLACEMENT, TRANSFEMORALUSING EDWARDS SAPIEN 3 29 MM AORTIC THV.;  Surgeon: Tonny Bollman, MD;  Location: [facility] OR;  Service: Open Heart Surgery;  Laterality: N/A;     Home Medications:  Prior to Admission medications   Medication Sig Start Date End Date Taking? Authorizing Provider  acetaminophen (TYLENOL) 325 MG tablet Take 2 tablets (650 mg total) by mouth every 6 (six) hours as needed for mild pain (or Fever >/= 101). 01/21/20  Yes Roddenberry, Cecille Amsterdam, PA-C  albuterol (VENTOLIN HFA) 108 (90 Base) MCG/ACT inhaler Inhale 1-2 puffs into the lungs every 6 (six) hours as needed for wheezing or shortness of breath. 07/27/23  Yes [name], Christy A, FNP  Budeson-Glycopyrrol-Formoterol (BREZTRI AEROSPHERE) 160-9-4.8 MCG/ACT AERO Inhale 2 puffs into the lungs 2 (two) times daily. 12/28/22  Yes [name], Christy A, FNP  dapagliflozin propanediol (FARXIGA) 10 MG TABS tablet Take 1 tablet (10 mg total) by mouth daily. Patient taking differently: Take 10 mg by mouth in the morning. 07/27/23  Yes [name], Christy A, FNP  ELIQUIS 5 MG TABS tablet Take 1 tablet (5 mg total) by mouth 2 (two) times daily. 07/27/23  Yes [name], Christy A, FNP  gabapentin (NEURONTIN) 100 MG capsule Take 1 capsule (100 mg total) by mouth 3 (three) times daily. 07/27/23  Yes [name], Christy A, FNP  glimepiride (AMARYL) 4 MG tablet Take 1 tablet (4 mg total) by mouth daily with breakfast. 07/27/23  Yes [name], Christy A, FNP  levothyroxine (SYNTHROID) 150 MCG tablet Take 1 tablet (150 mcg total) by mouth daily. 07/27/23  Yes [name], Christy A, FNP  metFORMIN (GLUCOPHAGE) 1000 MG tablet Take 1 tablet (1,000 mg total) by mouth 2 (two) times daily. 07/27/23  Yes [name], Christy A, FNP  nitroGLYCERIN (NITROSTAT) 0.4 MG SL tablet DISSOLVE ONE TABLET UNDER THE TONGUE EVERY 5 MINUTES AS NEEDED FOR CHEST PAIN.  DO NOT EXCEED A TOTAL OF 3 DOSES IN 15 MINUTES 07/27/23  Yes [name], Klemme A, FNP  potassium chloride SA (KLOR-CON M) 20 MEQ tablet TAKE 1 TABLET BY MOUTH DAILY 01/15/23  Yes Rollene Rotunda, MD  rosuvastatin (CRESTOR) 40 MG tablet Take 1 tablet (40 mg total) by mouth daily. Patient taking differently: Take 40 mg by mouth in the morning. 04/11/23  Yes Rollene Rotunda, MD  torsemide (DEMADEX) 20 MG tablet Take 2 tablets (40 mg total) by mouth daily. Patient taking differently: Take 40 mg by mouth in the morning. 07/27/23  Yes [name], Christy A, FNP  Vitamin D, Ergocalciferol, (DRISDOL) 1.25 MG (50000 UNIT)  CAPS capsule Take 1 capsule by mouth once a week Patient taking differently: Take 50,000 Units by mouth once a week. On Wednesday 06/14/23  Yes [name], Neysa Bonito A, FNP  cetirizine (ZYRTEC) 10 MG tablet Take 1 tablet (10 mg total) by mouth daily. Patient not taking: Reported on 07/27/2023 04/07/21   [name] A, FNP  glucose blood (ONETOUCH VERIO) test strip CHECK BLOOD SUGAR DAILY Dx E11.9 03/28/23   [name], FNP  Lancets [facility] DELICA PLUS [place]) MISC CHECK BLOOD SUGAR DAILY Dx E11.9 09/08/22   [name], FNP    Inpatient Medications: Scheduled Meds:  [START ON 08/14/2023] aspirin EC  81 mg Oral Daily   Continuous Infusions:  heparin 1,200 Units/hr (08/13/23 1349)   PRN Meds: acetaminophen, ondansetron (ZOFRAN) IV  Allergies:    Allergies  Allergen Reactions   Prednisone  Other (See Comments)    Pt states "sugar went to 580"   Januvia [Sitagliptin] Nausea And Vomiting   Jardiance [Empagliflozin] Rash    Tolerated Farxiga samples   Lisinopril Cough   Social History   Tobacco Use   Smoking status: Former    Current packs/day: 0.00    Types: Cigarettes    Quit date: 07/11/2009    Years since quitting: 14.0   Smokeless tobacco: Never   Tobacco comments:    smoked about 10 cig/day; used to smoke 2 ppd for many years   Substance Use Topics   Alcohol use: No    Alcohol/week: 0.0 standard drinks of alcohol  Marital Status: Married     Family History:    Family History  Problem Relation Age of Onset   Heart disease Father    Heart attack Father    Mental illness Mother    Mental illness Sister    Diabetes Brother    Depression Maternal Aunt      Review of Systems  Constitutional: Positive for decreased appetite and malaise/fatigue.  Cardiovascular:  Positive for chest pain (2 weeks ago) and dyspnea on exertion (2 weeks worse). Negative for leg swelling.    Physical Exam/Data:   Vitals:   08/13/23 1415 08/13/23 1528 08/13/23 1641 08/13/23 1830   BP:   104/76 97/67  Pulse:   (!) 140   Resp: (!) 25  (!) 22 16  Temp:  97.9 F (36.6 C)    TempSrc:  Oral    SpO2: 98%  99% 97%  Weight:      Height:        Intake/Output Summary (Last 24 hours) at 08/13/2023 2011 Last data filed at 08/13/2023 1349 Gross per 24 hour  Intake 51.7 ml  Output --  Net 51.7 ml      08/13/2023   11:42 AM 07/27/2023    9:29 AM 04/11/2023    9:26 AM  Last 3 Weights  Weight (lbs) 228 lb 228 lb 3.2 oz 237 lb  Weight (kg) 103.42 kg 103.511 kg 107.502 kg     Body mass index is 35.71 kg/m.   Physical Exam Constitutional:      General: He is not in acute distress.    Appearance: He is obese.  Neck:     Vascular: No carotid bruit or JVD.  Cardiovascular:     Rate and Rhythm: Regular rhythm. Tachycardia present.     Pulses:          Dorsalis pedis pulses are 0 on the right side and 0 on the left side.       Posterior tibial pulses are 0 on the right side and 0 on the left side.     Heart sounds: Normal heart sounds. No murmur heard.    No gallop.  Pulmonary:     Effort: Pulmonary effort is normal.     Breath sounds: Normal breath sounds.  Abdominal:     General: Bowel sounds are normal.     Palpations: Abdomen is soft.  Musculoskeletal:     Right lower leg: No edema.     Left lower leg: No edema.    Relevant CV Studies:  EKG from EMS reviewed.   EKG 08/13/2023: Atrial flutter with 2: 1 conduction, ventricular rate 148 bpm.  Completed anteroseptal infarct old.  Nonspecific T wave abnormality.- Personally Reviewed   ECHOCARDIOGRAM COMPLETE 06/08/2022  1. Left ventricular ejection fraction, by estimation, is 70 to 75%. The left ventricle has  hyperdynamic function. The left ventricle has no regional wall motion abnormalities. There is mild asymmetric left ventricular hypertrophy of the basal-septal segment. Left ventricular diastolic parameters are indeterminate. 2. Right ventricular systolic function is mildly reduced. The right ventricular size  is normal. Tricuspid regurgitation signal is inadequate for assessing PA pressure. 3. Right atrial size was mildly dilated. 4. The mitral valve is normal in structure. No evidence of mitral valve regurgitation. The mean mitral valve gradient is 8.0 mmHg with average heart rate of 93 bpm. Suspect elevated gradient through mitral valve due to high flow state and not mitral stenosis, MVA normal (3.3 cm^2) by continuity equation 5. The aortic valve has been repaired/replaced. Aortic valve regurgitation is not visualized. 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 Echo findings are consistent with normal structure and function of the aortic valve prosthesis. Aortic valve mean gradient measures 7.0 mmHg.   RIGHT/LEFT HEART CATH AND CORONARY/GRAFT ANGIOGRAPHY  12/30/2019      Laboratory Data:  High Sensitivity Troponin:   Recent Labs  Lab 08/13/23 1151 08/13/23 1344  TROPONINIHS 2,774* 2,562*     Chemistry Recent Labs  Lab 08/13/23 1151  NA 136  K 3.9  CL 100  CO2 25  GLUCOSE 157*  BUN 17  CREATININE 1.01  CALCIUM 8.6*  GFRNONAA >60  ANIONGAP 11    No results for input(s): "PROT", "ALBUMIN", "AST", "ALT", "ALKPHOS", "BILITOT" in the last 168 hours. Lipids  Recent Labs  Lab 08/13/23 1151  CHOL 74  TRIG 79  HDL 24*  LDLCALC 34  CHOLHDL 3.1    Hematology Recent Labs  Lab 08/13/23 1151  WBC 12.1*  RBC 4.39  HGB 11.8*  HCT 38.0*  MCV 86.6  MCH 26.9  MCHC 31.1  RDW 13.7  PLT 164   Thyroid No results for input(s): "TSH", "FREET4" in the last 168 hours.  BNP Recent Labs  Lab 08/13/23 1151  BNP 324.7*    DDimer No results for input(s): "DDIMER" in the last 168 hours.   Radiology/Studies:  DG Chest 2 View Result Date: 08/13/2023 CLINICAL DATA:  Chest pain.  Shortness of breath. EXAM: CHEST - 2 VIEW COMPARISON:  06/23/2022. FINDINGS: Low lung volume. Bilateral lung fields are clear. No dense consolidation or lung  collapse. No overt pulmonary edema. Bilateral costophrenic angles are clear. Normal cardio-mediastinal silhouette. There are surgical staples along the heart border and sternotomy wires, status post CABG (coronary artery bypass graft). No acute osseous abnormalities. The soft tissues are within normal limits. IMPRESSION: No active cardiopulmonary disease. Electronically Signed   By: Jules Schick M.D.   On: 08/13/2023 15:37     Assessment and Plan:   1.  Most probably completed infarct in the anteroseptal region with Q waves and persistent ST elevation in V1-V4 with nonspecific T abnormality. Unstable angina and other form of angina  2.  Atrial flutter with 2: 1 conduction. 3.  CAD SP CABG in 2011 with LIMA to LAD, SVG to OM1, SVG to PDA and PL branch of RCA. 4.  History of TAVR 2021 (TAVR on 01/20/2020 (29 mm [employer]). 5.  Paroxysmal atrial fibrillation with RVR, patient now appears to be in a flutter.  Patient is on Eliquis at home.   Recommendation: Cancel STEMI.  Patient needs admission to the hospital for further evaluation and management of tachyarrhythmia, stabilization of medical therapy and evaluate his LV systolic function.   Suspect he probably completed his anteroseptal infarct 2  weeks ago and has been having weakness and fatigue over the past 2 weeks, no further chest pain or the past 1 week.   Patient wishes to be DNR. Discussed with patient and also daughter and wife.  He is aware of the decision made.  For questions or updates, please contact  [facility] Please consult [email or web address] for contact info under    Signed, Yates Decamp, MD  08/13/2023 8:11 PM

## 2023-08-14 ENCOUNTER — Encounter (HOSPITAL_COMMUNITY): Payer: Self-pay | Admitting: Cardiology

## 2023-08-14 ENCOUNTER — Inpatient Hospital Stay (HOSPITAL_COMMUNITY)

## 2023-08-14 ENCOUNTER — Other Ambulatory Visit (HOSPITAL_COMMUNITY)

## 2023-08-14 DIAGNOSIS — I2510 Atherosclerotic heart disease of native coronary artery without angina pectoris: Secondary | ICD-10-CM | POA: Diagnosis not present

## 2023-08-14 DIAGNOSIS — I200 Unstable angina: Secondary | ICD-10-CM | POA: Diagnosis not present

## 2023-08-14 LAB — ECHOCARDIOGRAM COMPLETE
Height: 67 in
S' Lateral: 4.2 cm
Single Plane A4C EF: 4.6 %
Weight: 3648 [oz_av]

## 2023-08-14 LAB — BRAIN NATRIURETIC PEPTIDE: B Natriuretic Peptide: 377.8 pg/mL — ABNORMAL HIGH (ref 0.0–100.0)

## 2023-08-14 LAB — CBC
HCT: 41.6 % (ref 39.0–52.0)
Hemoglobin: 12.9 g/dL — ABNORMAL LOW (ref 13.0–17.0)
MCH: 26.3 pg (ref 26.0–34.0)
MCHC: 31 g/dL (ref 30.0–36.0)
MCV: 84.7 fL (ref 80.0–100.0)
Platelets: 192 10*3/uL (ref 150–400)
RBC: 4.91 MIL/uL (ref 4.22–5.81)
RDW: 14 % (ref 11.5–15.5)
WBC: 12.2 10*3/uL — ABNORMAL HIGH (ref 4.0–10.5)
nRBC: 0 % (ref 0.0–0.2)

## 2023-08-14 LAB — APTT
aPTT: 41 s — ABNORMAL HIGH (ref 24–36)
aPTT: 46 s — ABNORMAL HIGH (ref 24–36)
aPTT: 63 s — ABNORMAL HIGH (ref 24–36)

## 2023-08-14 LAB — HEPARIN LEVEL (UNFRACTIONATED): Heparin Unfractionated: >1.1 [IU]/mL — ABNORMAL HIGH (ref 0.30–0.70)

## 2023-08-14 MED ORDER — METFORMIN HCL 500 MG PO TABS
1000.0000 mg | ORAL_TABLET | Freq: Two times a day (BID) | ORAL | Status: DC
  Administered 2023-08-14: 1000 mg via ORAL
  Filled 2023-08-14: qty 2

## 2023-08-14 MED ORDER — APIXABAN 5 MG PO TABS: 5.0000 mg | ORAL_TABLET | Freq: Two times a day (BID) | ORAL | Status: DC

## 2023-08-14 MED ORDER — NITROGLYCERIN 0.4 MG SL SUBL: 0.4000 mg | SUBLINGUAL_TABLET | SUBLINGUAL | Status: DC | PRN

## 2023-08-14 MED ORDER — DAPAGLIFLOZIN PROPANEDIOL 10 MG PO TABS
10.0000 mg | ORAL_TABLET | Freq: Every day | ORAL | Status: DC
  Administered 2023-08-14 – 2023-08-20 (×6): 10 mg via ORAL
  Filled 2023-08-14 (×7): qty 1

## 2023-08-14 MED ORDER — BUDESON-GLYCOPYRROL-FORMOTEROL 160-9-4.8 MCG/ACT IN AERO: 2.0000 | INHALATION_SPRAY | Freq: Two times a day (BID) | RESPIRATORY_TRACT | Status: DC

## 2023-08-14 MED ORDER — ROSUVASTATIN CALCIUM 20 MG PO TABS
40.0000 mg | ORAL_TABLET | Freq: Every day | ORAL | Status: DC
  Administered 2023-08-14 – 2023-08-20 (×6): 40 mg via ORAL
  Filled 2023-08-14 (×8): qty 2

## 2023-08-14 MED ORDER — ENSURE ENLIVE PO LIQD
237.0000 mL | Freq: Two times a day (BID) | ORAL | Status: DC
  Administered 2023-08-15 – 2023-08-19 (×5): 237 mL via ORAL

## 2023-08-14 MED ORDER — LEVOTHYROXINE SODIUM 75 MCG PO TABS
150.0000 ug | ORAL_TABLET | Freq: Every day | ORAL | Status: DC
  Administered 2023-08-14 – 2023-08-15 (×2): 150 ug via ORAL
  Filled 2023-08-14 (×2): qty 2

## 2023-08-14 MED ORDER — METOPROLOL TARTRATE 25 MG PO TABS: 25.0000 mg | ORAL_TABLET | Freq: Two times a day (BID) | ORAL | Status: DC

## 2023-08-14 MED ORDER — VITAMIN D (ERGOCALCIFEROL) 1.25 MG (50000 UNIT) PO CAPS
50000.0000 [IU] | ORAL_CAPSULE | ORAL | Status: DC
  Administered 2023-08-14: 50000 [IU] via ORAL
  Filled 2023-08-14 (×2): qty 1

## 2023-08-14 MED ORDER — LORATADINE 10 MG PO TABS
10.0000 mg | ORAL_TABLET | Freq: Every day | ORAL | Status: DC
  Administered 2023-08-14 – 2023-08-20 (×6): 10 mg via ORAL
  Filled 2023-08-14 (×7): qty 1

## 2023-08-14 MED ORDER — GABAPENTIN 100 MG PO CAPS
100.0000 mg | ORAL_CAPSULE | Freq: Three times a day (TID) | ORAL | Status: DC
  Administered 2023-08-14 – 2023-08-20 (×19): 100 mg via ORAL
  Filled 2023-08-14 (×22): qty 1

## 2023-08-14 MED ORDER — PERFLUTREN LIPID MICROSPHERE
1.0000 mL | INTRAVENOUS | Status: AC | PRN
  Administered 2023-08-14: 4 mL via INTRAVENOUS

## 2023-08-14 MED ORDER — FUROSEMIDE 10 MG/ML IJ SOLN
40.0000 mg | Freq: Two times a day (BID) | INTRAMUSCULAR | Status: DC
  Administered 2023-08-14: 40 mg via INTRAVENOUS
  Filled 2023-08-14 (×2): qty 4

## 2023-08-14 MED ORDER — UMECLIDINIUM BROMIDE 62.5 MCG/ACT IN AEPB
1.0000 | INHALATION_SPRAY | Freq: Every day | RESPIRATORY_TRACT | Status: DC
  Administered 2023-08-16 – 2023-08-21 (×4): 1 via RESPIRATORY_TRACT
  Filled 2023-08-14 (×3): qty 7

## 2023-08-14 MED ORDER — FLUTICASONE FUROATE-VILANTEROL 200-25 MCG/ACT IN AEPB
1.0000 | INHALATION_SPRAY | Freq: Every day | RESPIRATORY_TRACT | Status: DC
  Administered 2023-08-16 – 2023-08-21 (×4): 1 via RESPIRATORY_TRACT
  Filled 2023-08-14 (×3): qty 28

## 2023-08-14 MED ORDER — TORSEMIDE 20 MG PO TABS
40.0000 mg | ORAL_TABLET | Freq: Every day | ORAL | Status: DC
  Administered 2023-08-14: 40 mg via ORAL
  Filled 2023-08-14: qty 2

## 2023-08-14 MED ORDER — ACETAMINOPHEN 325 MG PO TABS: 650.0000 mg | ORAL_TABLET | Freq: Four times a day (QID) | ORAL | Status: DC | PRN

## 2023-08-14 MED ORDER — ALBUTEROL SULFATE HFA 108 (90 BASE) MCG/ACT IN AERS
1.0000 | INHALATION_SPRAY | Freq: Four times a day (QID) | RESPIRATORY_TRACT | Status: DC | PRN
  Administered 2023-08-14: 2 via RESPIRATORY_TRACT
  Filled 2023-08-14: qty 6.7

## 2023-08-14 MED ORDER — AMIODARONE HCL 200 MG PO TABS
400.0000 mg | ORAL_TABLET | Freq: Two times a day (BID) | ORAL | Status: DC
  Administered 2023-08-14: 400 mg via ORAL
  Filled 2023-08-14: qty 2

## 2023-08-14 MED ORDER — POTASSIUM CHLORIDE CRYS ER 20 MEQ PO TBCR
20.0000 meq | EXTENDED_RELEASE_TABLET | Freq: Every day | ORAL | Status: DC
  Administered 2023-08-14 – 2023-08-20 (×5): 20 meq via ORAL
  Filled 2023-08-14 (×6): qty 1

## 2023-08-14 MED ORDER — GLIMEPIRIDE 4 MG PO TABS
4.0000 mg | ORAL_TABLET | Freq: Every day | ORAL | Status: DC
  Administered 2023-08-14 – 2023-08-20 (×6): 4 mg via ORAL
  Filled 2023-08-14 (×9): qty 1

## 2023-08-14 NOTE — Progress Notes (Signed)
 Patients BP 98/64 heart rate 88 notified MD per order, received order to stop Amiodarone gtt and to star PO amiodarone.  Orders confirmed and initiated.

## 2023-08-14 NOTE — Progress Notes (Signed)
   Heart Failure Stewardship Pharmacist Progress Note   PCP: [name], FNP PCP-Cardiologist: Rollene Rotunda, MD    HPI:  Matthew May is a 78 y.o. male with PMHx of T2DM, COPD, HTN, hypothyroidism, paroxysmal A fib, PAD, and HLD.   He presented to [facility] ED on 03/09 with complaints of shortness of breath and chest pain. He reported he had a similar episode of chest pain 2 weeks prior. He described he pain as so severe he thought he was going to die. He took 3 doses of SLG NTG which provided some relief. Shortness of breath occurs both at rest and on exertion. He also had complaints of weakness and poor appetite. HR was in 140s in the ED. EKG showed A flutter. Started on IV amiodarone and transitioned to PO on 03/11 and metoprolol 25 mg BID. Lower extremity edema seen on exam. CXR on 03/10 showed no active cardiopulmonary disease. ECHO from 03/11 revealed LVEF 20-25% (70-75% in Jan 2024), LV has severely decreased function, global hypokinesis, mild concentric LVH, RV size is slightly enlarged, and MV is normal in structure. Cardiology suspects patient experienced an infarct 2 weeks ago. Considering cardiac MRI.   Due to suspected cardiogenic  shock, patient is being transferred to ICU. Patient showing signs of declining renal function, hypotensive, and tachycardic. PICC ordered to check CO-Ox was 65.9. Today patient stated he is still experiencing SOB. Bilatral lower extremity edema noted on exam. His left LE is worse than the right. Upper and lower extremities were warm to the touch.Patient stated the swelling is painful. He was on [place] with 3 L of oxygen. Restarted IV amiodarone for recurrent A fib.   Current HF Medications: Diuretic: Furosemide 40 mg IV BID Beta Blocker: Metoprolol tartrate 50 mg BID SGLT2i: Farxiga 10 mg QD Other: Kcl 20 mEq QD    Prior to admission HF Medications: Diuretic: Torsemide 40 mg QAM SGLT2i: Farxiga 10 mg QD Other: Potassium chloride 20 mEq QD  Pertinent  Lab Values: Serum creatinine 1.54 (1.01), BUN 27 (17), Potassium 3.7 (3.9), Sodium 132 (136), BNP 377.8, Magnesium not ordered, A1c 7.2 (Jan 2024)  Vital Signs: Weight: 226.9 lbs (admission weight: 228.2 lbs) Blood pressure: 82/62-121/96 (121/96 at 0835)   Heart rate: 63-124 (125 at 0835) I/O: net -1.03 L yesterday; net -2.2L since admission  Medication Assistance / Insurance Benefits Check: Does the patient have prescription insurance?  Yes Type of insurance plan: [employer]  Outpatient Pharmacy:  Prior to admission outpatient pharmacy: [facility] [address] [place] [address]  Is the patient willing to use [facility] TOC pharmacy at discharge? Pending Is the patient willing to transition their outpatient pharmacy to utilize a [facility] outpatient pharmacy?   Pending    Assessment: 1. Acute systolic CHF (LVEF 20-25%). NYHA class IV symptoms. - Order Mg lab -CO-Ox normal -Furosemide was transitioned to drip 10 mg/hr CIV  -Metoprolol stopped for concern of low CO    Plan: 1) Medication changes recommended at this time: -None. Advanced HF team has been consulted.    Sofie Rower, PharmD [facility] PGY-1

## 2023-08-14 NOTE — ED Notes (Signed)
 ED pharmacist came to talk to this RN about pt heparin infusion and that the lab value was high. Pharmacist wanted APTT to decide the next steps with heparin infusion. Lab was called to add on the APTT to lab that was just sent down in the 5am hour. Lab is in process pharm will follow up with drip after lab results.

## 2023-08-14 NOTE — Progress Notes (Signed)
  Echocardiogram 2D Echocardiogram has been performed.  Leda Roys RDCS 08/14/2023, 11:08 AM

## 2023-08-14 NOTE — Progress Notes (Signed)
 PHARMACY - ANTICOAGULATION CONSULT NOTE  Pharmacy Consult for UFH IV Infusion Indication: chest pain/ACS  Allergies  Allergen Reactions   Prednisone Other (See Comments)    Pt states "sugar went to 580"   Januvia [Sitagliptin] Nausea And Vomiting   Jardiance [Empagliflozin] Rash    Tolerated Farxiga samples   Lisinopril Cough    Patient Measurements: Height: 5\' 7"  (170.2 cm) Weight: 103.4 kg (228 lb) IBW/kg (Calculated) : 66.1 Heparin Dosing Weight: 89 kg  Vital Signs: Temp: 97.8 F (36.6 C) (03/11 1219) Temp Source: Axillary (03/11 1219) BP: 104/89 (03/11 1011) Pulse Rate: 124 (03/11 1011)  Labs: Recent Labs    08/13/23 1151 08/13/23 1344 08/13/23 1830 08/13/23 2000 08/14/23 0511 08/14/23 1233  HGB 11.8*  --   --   --  12.9*  --   HCT 38.0*  --   --   --  41.6  --   PLT 164  --   --   --  192  --   APTT  --   --   --   --  41* 46*  HEPARINUNFRC  --   --  >1.10* >1.10* >1.10*  --   CREATININE 1.01  --   --   --   --   --   TROPONINIHS 2,774* 2,562*  --   --   --   --     Estimated Creatinine Clearance: 71.3 mL/min (by C-G formula based on SCr of 1.01 mg/dL).   Assessment: Matthew May is a 78 y.o. year old male admitted on 08/13/2023 with CP and concern for unstable angina. Eliquis prior to admission for afib (last dose 3/10 @ 0530). Pharmacy consulted to dose heparin.  Repeat aPTT up to 46 seconds but still subtherapeutic, appears to be infusing ok.  Goal of Therapy:  Heparin level 0.3-0.7 units/ml aPTT 66-102 secs Monitor platelets by anticoagulation protocol: Yes   Plan:  Increase heparin infusion at 1450 units/h Repeat aPTT in 8h  Fredonia Highland, PharmD, [place], [facility] [place] [facility] Clinical Pharmacist [phone] Please check AMION for all [facility] Pharmacy numbers 08/14/2023

## 2023-08-14 NOTE — Progress Notes (Signed)
 Rounding Note    Patient Name: Matthew May Date of Encounter: 08/14/2023  [facility] Cardiologist: Rollene Rotunda, MD   Subjective   He has increased wob , sitting up   Inpatient Medications    Scheduled Meds:  aspirin EC  81 mg Oral Daily   dapagliflozin propanediol  10 mg Oral Daily   fluticasone furoate-vilanterol  1 puff Inhalation Daily   And   umeclidinium bromide  1 puff Inhalation Daily   gabapentin  100 mg Oral TID   glimepiride  4 mg Oral Q breakfast   levothyroxine  150 mcg Oral Daily   loratadine  10 mg Oral Daily   metFORMIN  1,000 mg Oral BID   metoprolol tartrate  50 mg Oral BID   potassium chloride SA  20 mEq Oral Daily   rosuvastatin  40 mg Oral Daily   torsemide  40 mg Oral Daily   Vitamin D (Ergocalciferol)  50,000 Units Oral Weekly   Continuous Infusions:  amiodarone 30 mg/hr (08/14/23 0913)   heparin 1,200 Units/hr (08/14/23 0648)   PRN Meds: acetaminophen, acetaminophen, albuterol, nitroGLYCERIN, ondansetron (ZOFRAN) IV   Vital Signs    Vitals:   08/14/23 0630 08/14/23 0709 08/14/23 0711 08/14/23 0900  BP: 104/73 111/83 111/83 109/79  Pulse:  100    Resp:  (!) 25  (!) 33  Temp:  97.8 F (36.6 C)    TempSrc:  Oral    SpO2: 96% 93% 97% 94%  Weight:      Height:        Intake/Output Summary (Last 24 hours) at 08/14/2023 0958 Last data filed at 08/14/2023 0913 Gross per 24 hour  Intake 488.19 ml  Output 100 ml  Net 388.19 ml      08/13/2023   11:42 AM 07/27/2023    9:29 AM 04/11/2023    9:26 AM  Last 3 Weights  Weight (lbs) 228 lb 228 lb 3.2 oz 237 lb  Weight (kg) 103.42 kg 103.511 kg 107.502 kg      Telemetry    Atrial flutter rates < 120 - Personally Reviewed  ECG    Atrial flutter, LBBB - Personally Reviewed  Physical Exam   Vitals:   08/14/23 0711 08/14/23 0900  BP: 111/83 109/79  Pulse:    Resp:  (!) 33  Temp:    SpO2: 97% 94%    GEN: Increased work of breathing Neck: challenging to assess  with wob Cardiac: RRR, no murmurs, rubs, or gallops.  Respiratory: increased wob, decreased BS GI: Distended MS: Venous stasis wounds. Neuro:  Nonfocal  Psych: Normal affect   Labs    High Sensitivity Troponin:   Recent Labs  Lab 08/13/23 1151 08/13/23 1344  TROPONINIHS 2,774* 2,562*     Chemistry Recent Labs  Lab 08/13/23 1151  NA 136  K 3.9  CL 100  CO2 25  GLUCOSE 157*  BUN 17  CREATININE 1.01  CALCIUM 8.6*  GFRNONAA >60  ANIONGAP 11    Lipids  Recent Labs  Lab 08/13/23 1151  CHOL 74  TRIG 79  HDL 24*  LDLCALC 34  CHOLHDL 3.1    Hematology Recent Labs  Lab 08/13/23 1151 08/14/23 0511  WBC 12.1* 12.2*  RBC 4.39 4.91  HGB 11.8* 12.9*  HCT 38.0* 41.6  MCV 86.6 84.7  MCH 26.9 26.3  MCHC 31.1 31.0  RDW 13.7 14.0  PLT 164 192   Thyroid No results for input(s): "TSH", "FREET4" in the last 168 hours.  BNP Recent Labs  Lab 08/13/23 1151 08/14/23 0511  BNP 324.7* 377.8*    DDimer No results for input(s): "DDIMER" in the last 168 hours.   Radiology    DG Chest 2 View Result Date: 08/13/2023 CLINICAL DATA:  Chest pain.  Shortness of breath. EXAM: CHEST - 2 VIEW COMPARISON:  06/23/2022. FINDINGS: Low lung volume. Bilateral lung fields are clear. No dense consolidation or lung collapse. No overt pulmonary edema. Bilateral costophrenic angles are clear. Normal cardio-mediastinal silhouette. There are surgical staples along the heart border and sternotomy wires, status post CABG (coronary artery bypass graft). No acute osseous abnormalities. The soft tissues are within normal limits. IMPRESSION: No active cardiopulmonary disease. Electronically Signed   By: Jules Schick M.D.   On: 08/13/2023 15:37    Cardiac Studies   ECHOCARDIOGRAM COMPLETE 06/08/2022  1. Left ventricular ejection fraction, by estimation, is 70 to 75%. The left ventricle has hyperdynamic function. The left ventricle has no regional wall motion abnormalities. There is mild asymmetric  left ventricular hypertrophy of the basal-septal segment. Left ventricular diastolic parameters are indeterminate. 2. Right ventricular systolic function is mildly reduced. The right ventricular size is normal. Tricuspid regurgitation signal is inadequate for assessing PA pressure. 3. Right atrial size was mildly dilated. 4. The mitral valve is normal in structure. No evidence of mitral valve regurgitation. The mean mitral valve gradient is 8.0 mmHg with average heart rate of 93 bpm. Suspect elevated gradient through mitral valve due to high flow state and not mitral stenosis, MVA normal (3.3 cm^2) by continuity equation 5. The aortic valve has been repaired/replaced. Aortic valve regurgitation is not visualized. 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 Echo findings are consistent with normal structure and function of the aortic valve prosthesis. Aortic valve mean gradient measures 7.0 mmHg.   RIGHT/LEFT HEART CATH AND CORONARY/GRAFT ANGIOGRAPHY  12/30/2019     Patient Profile     Matthew May is a 78 y.o. 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  He was reporting chest pain over the last 2 weeks. Troponin 2,774->2,562 - heparin gtt will continue medical management - aspirin 81 mg daily - continue metop tartrate 50 mg BID - continue crestor 40 mg daily - nitroglycerin SL PRN  Atrial Flutter Rate controlled -  cont amio gtt   S/p TAVR Stable  DM2 Stopped metformin while inpatient   For questions or updates, please contact [facility] Please consult [email or web address] for contact info under        Signed, Matthew Fus, MD  08/14/2023, 9:58 AM

## 2023-08-14 NOTE — Plan of Care (Signed)

## 2023-08-14 NOTE — Progress Notes (Signed)
 PHARMACY - ANTICOAGULATION CONSULT NOTE  Pharmacy Consult for UFH IV Infusion Indication: chest pain/ACS  Allergies  Allergen Reactions   Prednisone Other (See Comments)    Pt states "sugar went to 580"   Januvia [Sitagliptin] Nausea And Vomiting   Jardiance [Empagliflozin] Rash    Tolerated Farxiga samples   Lisinopril Cough    Patient Measurements: Height: 5\' 7"  (170.2 cm) Weight: 103.4 kg (228 lb) IBW/kg (Calculated) : 66.1 Heparin Dosing Weight: 89 kg  Vital Signs: Temp: 98.1 F (36.7 C) (03/11 1946) Temp Source: Oral (03/11 1946) BP: 100/76 (03/11 1946) Pulse Rate: 63 (03/11 1946)  Labs: Recent Labs    08/13/23 1151 08/13/23 1344 08/13/23 1830 08/13/23 2000 08/14/23 0511 08/14/23 1233 08/14/23 2204  HGB 11.8*  --   --   --  12.9*  --   --   HCT 38.0*  --   --   --  41.6  --   --   PLT 164  --   --   --  192  --   --   APTT  --   --   --   --  41* 46* 63*  HEPARINUNFRC  --   --  >1.10* >1.10* >1.10*  --   --   CREATININE 1.01  --   --   --   --   --   --   TROPONINIHS 2,774* 2,562*  --   --   --   --   --     Estimated Creatinine Clearance: 71.3 mL/min (by C-G formula based on SCr of 1.01 mg/dL).   Assessment: ZIAH LEANDRO is a 78 y.o. year old male admitted on 08/13/2023 with CP and concern for unstable angina. Eliquis prior to admission for afib (last dose 3/10 @ 0530). Pharmacy consulted to dose heparin.  aPTT 63 sec (slightly subtherapeutic) on infusion at 1450 units/hr. No issues with line or bleeding reported per RN.  Goal of Therapy:  Heparin level 0.3-0.7 units/ml aPTT 66-102 secs Monitor platelets by anticoagulation protocol: Yes   Plan:  Increase heparin infusion to 1600 units/h aPTT in 8 hours  Christoper Fabian, PharmD, BCPS Please see amion for complete clinical pharmacist phone list 08/14/2023

## 2023-08-14 NOTE — Progress Notes (Signed)
 PHARMACY - ANTICOAGULATION CONSULT NOTE  Pharmacy Consult for UFH IV Infusion Indication: chest pain/ACS  Allergies  Allergen Reactions   Prednisone Other (See Comments)    Pt states "sugar went to 580"   Januvia [Sitagliptin] Nausea And Vomiting   Jardiance [Empagliflozin] Rash    Tolerated Farxiga samples   Lisinopril Cough    Patient Measurements: Height: 5\' 7"  (170.2 cm) Weight: 103.4 kg (228 lb) IBW/kg (Calculated) : 66.1 Heparin Dosing Weight: 89 kg  Vital Signs: Temp: 98.5 F (36.9 C) (03/11 0500) Temp Source: Oral (03/11 0053) BP: 107/79 (03/11 0500) Pulse Rate: 76 (03/11 0100)  Labs: Recent Labs    08/13/23 1151 08/13/23 1344 08/13/23 1830 08/13/23 2000 08/14/23 0511  HGB 11.8*  --   --   --  12.9*  HCT 38.0*  --   --   --  41.6  PLT 164  --   --   --  192  HEPARINUNFRC  --   --  >1.10* >1.10* >1.10*  CREATININE 1.01  --   --   --   --   TROPONINIHS 2,774* 2,562*  --   --   --     Estimated Creatinine Clearance: 71.3 mL/min (by C-G formula based on SCr of 1.01 mg/dL).   Assessment: Matthew May is a 78 y.o. year old male admitted on 08/13/2023 with CP and concern for unstable angina. Eliquis prior to admission for afib (last dose 3/10 @ 0530). Pharmacy consulted to dose heparin.  Heparin level >1.1, supratherapeutic as expected while on eliquis  aPTT 41, subtherapeutic No issues with bleeding or infusion.  Goal of Therapy:  Heparin level 0.3-0.7 units/ml aPTT 66-102 secs Monitor platelets by anticoagulation protocol: Yes   Plan:  Increase heparin infusion at 1200 units/hr 6 aPTT  Daily aPTT, heparin level, CBC, and monitoring for bleeding F/u cards plans for cardiac cath and plans for resuming oral anticoagulation   Thank you for allowing pharmacy to participate in this patient's care.  Marja Kays, PharmD Emergency Medicine Clinical Pharmacist 08/14/2023,6:39 AM

## 2023-08-15 ENCOUNTER — Inpatient Hospital Stay (HOSPITAL_COMMUNITY)

## 2023-08-15 ENCOUNTER — Other Ambulatory Visit: Payer: Self-pay

## 2023-08-15 ENCOUNTER — Telehealth: Payer: Self-pay | Admitting: Cardiology

## 2023-08-15 DIAGNOSIS — I200 Unstable angina: Secondary | ICD-10-CM | POA: Diagnosis not present

## 2023-08-15 LAB — CBC
HCT: 39.4 % (ref 39.0–52.0)
Hemoglobin: 12.8 g/dL — ABNORMAL LOW (ref 13.0–17.0)
MCH: 27.8 pg (ref 26.0–34.0)
MCHC: 32.5 g/dL (ref 30.0–36.0)
MCV: 85.5 fL (ref 80.0–100.0)
Platelets: 185 10*3/uL (ref 150–400)
RBC: 4.61 MIL/uL (ref 4.22–5.81)
RDW: 13.9 % (ref 11.5–15.5)
WBC: 11.8 10*3/uL — ABNORMAL HIGH (ref 4.0–10.5)
nRBC: 0 % (ref 0.0–0.2)

## 2023-08-15 LAB — COOXEMETRY PANEL
Carboxyhemoglobin: 1.8 % — ABNORMAL HIGH (ref 0.5–1.5)
Methemoglobin: <0.7 % (ref 0.0–1.5)
O2 Saturation: 65.9 %
Total hemoglobin: 13 g/dL (ref 12.0–16.0)

## 2023-08-15 LAB — BASIC METABOLIC PANEL
Anion gap: 11 (ref 5–15)
BUN: 27 mg/dL — ABNORMAL HIGH (ref 8–23)
CO2: 26 mmol/L (ref 22–32)
Calcium: 8.3 mg/dL — ABNORMAL LOW (ref 8.9–10.3)
Chloride: 95 mmol/L — ABNORMAL LOW (ref 98–111)
Creatinine, Ser: 1.54 mg/dL — ABNORMAL HIGH (ref 0.61–1.24)
GFR, Estimated: 46 mL/min — ABNORMAL LOW (ref >60)
Glucose, Bld: 96 mg/dL (ref 70–99)
Potassium: 3.7 mmol/L (ref 3.5–5.1)
Sodium: 132 mmol/L — ABNORMAL LOW (ref 135–145)

## 2023-08-15 LAB — APTT
aPTT: 62 s — ABNORMAL HIGH (ref 24–36)
aPTT: 112 s — ABNORMAL HIGH (ref 24–36)

## 2023-08-15 LAB — LACTIC ACID, PLASMA
Lactic Acid, Venous: 1.3 mmol/L (ref 0.5–1.9)
Lactic Acid, Venous: 1.2 mmol/L (ref 0.5–1.9)

## 2023-08-15 LAB — MAGNESIUM: Magnesium: 2 mg/dL (ref 1.7–2.4)

## 2023-08-15 LAB — HEPARIN LEVEL (UNFRACTIONATED): Heparin Unfractionated: >1.1 [IU]/mL — ABNORMAL HIGH (ref 0.30–0.70)

## 2023-08-15 LAB — MRSA NEXT GEN BY PCR, NASAL: MRSA by PCR Next Gen: DETECTED — AB

## 2023-08-15 LAB — LIPOPROTEIN A (LPA): Lipoprotein (a): 187.1 nmol/L — ABNORMAL HIGH (ref <75.0)

## 2023-08-15 MED ORDER — AMIODARONE HCL IN DEXTROSE 360-4.14 MG/200ML-% IV SOLN
60.0000 mg/h | INTRAVENOUS | Status: AC
  Administered 2023-08-15 (×2): 60 mg/h via INTRAVENOUS
  Filled 2023-08-15: qty 200

## 2023-08-15 MED ORDER — CHLORHEXIDINE GLUCONATE CLOTH 2 % EX PADS
6.0000 | MEDICATED_PAD | Freq: Every day | CUTANEOUS | Status: DC
  Administered 2023-08-15 – 2023-08-22 (×6): 6 via TOPICAL

## 2023-08-15 MED ORDER — SODIUM CHLORIDE 0.9% FLUSH
10.0000 mL | Freq: Two times a day (BID) | INTRAVENOUS | Status: DC
  Administered 2023-08-15 – 2023-08-22 (×10): 10 mL

## 2023-08-15 MED ORDER — FUROSEMIDE 10 MG/ML IJ SOLN
5.0000 mg/h | INTRAVENOUS | Status: DC
  Administered 2023-08-15 (×2): 10 mg/h via INTRAVENOUS
  Filled 2023-08-15 (×4): qty 20

## 2023-08-15 MED ORDER — SODIUM CHLORIDE 0.9% FLUSH: 10.0000 mL | INTRAVENOUS | Status: DC | PRN

## 2023-08-15 MED ORDER — AMIODARONE LOAD VIA INFUSION
150.0000 mg | Freq: Once | INTRAVENOUS | Status: AC
  Administered 2023-08-15: 150 mg via INTRAVENOUS
  Filled 2023-08-15: qty 83.34

## 2023-08-15 MED ORDER — FUROSEMIDE 10 MG/ML IJ SOLN
80.0000 mg | Freq: Once | INTRAMUSCULAR | Status: AC
  Administered 2023-08-15: 80 mg via INTRAVENOUS
  Filled 2023-08-15: qty 8

## 2023-08-15 MED ORDER — MUPIROCIN 2 % EX OINT
TOPICAL_OINTMENT | Freq: Two times a day (BID) | CUTANEOUS | Status: DC
  Administered 2023-08-17 – 2023-08-21 (×4): 1 via NASAL
  Filled 2023-08-15 (×3): qty 22

## 2023-08-15 MED ORDER — AMIODARONE HCL IN DEXTROSE 360-4.14 MG/200ML-% IV SOLN
30.0000 mg/h | INTRAVENOUS | Status: DC
  Administered 2023-08-15 – 2023-08-17 (×6): 30 mg/h via INTRAVENOUS
  Filled 2023-08-15 (×5): qty 200

## 2023-08-15 MED ORDER — LEVOTHYROXINE SODIUM 75 MCG PO TABS
150.0000 ug | ORAL_TABLET | Freq: Every day | ORAL | Status: DC
  Administered 2023-08-16 – 2023-08-21 (×6): 150 ug via ORAL
  Filled 2023-08-15 (×6): qty 2

## 2023-08-15 NOTE — H&P (View-Only) (Signed)
 Advanced Heart Failure Team Consult Note   Primary Physician: [name], FNP Cardiologist:  Rollene Rotunda, MD  Reason for Consultation: Heart Failure/ Cardiogenic Shock  HPI:    Matthew May is seen today for evaluation of heart failure and suspected cardiogenic shock at the request of  Dr Wyline Mood.   Matthew May is  a 78 year old with a history of CAD, CABG 2011, severe AS TAVR 2021, HTN,COPD, HLD, DMII, and PAF. He has been on elqiuis since 2021.   Most recent Echo 2024 EF 70-75%  In February he saw [name] for PCP visit. - EKG A fib RVR 120 bpm. His wife said he did not want seek further treatment and went back home.   Started having intermittent chest pain a couple weeks ago. His wife tells me he was weaker everyday and he wouldn't tell them her if he was having chest pain. He started having a hard time moving around and had poor appetite.   Presented to ED via EMS with weakness and poor appetite.His wife convinced him to call EMS. EMS EKG with ST elevated V1-V4. CODE STEMI called. Dr Nadara Eaton reviewed suspected completed infarct. CODE STEMI cancelled.  BNP 377. EKG A flutter RVR. Diuresing with IV lasix. Negative 1.6 liters.   Started on amio drip and heparin drip.  Diuresing with intermittent IV lasix. Echo EF 20-25% RV severely reduced. Today BP soft. PICC ordered. Concern for cardiogenic shock with AKI/hypotension. Complaining of shortness of breath. Lactic acid 1.3    Home Medications Prior to Admission medications   Medication Sig Start Date End Date Taking? Authorizing Provider  acetaminophen (TYLENOL) 325 MG tablet Take 2 tablets (650 mg total) by mouth every 6 (six) hours as needed for mild pain (or Fever >/= 101). 01/21/20  Yes Roddenberry, Cecille [place], PA-C  albuterol (VENTOLIN HFA) 108 (90 Base) MCG/ACT inhaler Inhale 1-2 puffs into the lungs every 6 (six) hours as needed for wheezing or shortness of breath. 07/27/23  Yes [name], Christy A, FNP   Budeson-Glycopyrrol-Formoterol (BREZTRI AEROSPHERE) 160-9-4.8 MCG/ACT AERO Inhale 2 puffs into the lungs 2 (two) times daily. 12/28/22  Yes [name], Christy A, FNP  dapagliflozin propanediol (FARXIGA) 10 MG TABS tablet Take 1 tablet (10 mg total) by mouth daily. Patient taking differently: Take 10 mg by mouth in the morning. 07/27/23  Yes [name], Christy A, FNP  ELIQUIS 5 MG TABS tablet Take 1 tablet (5 mg total) by mouth 2 (two) times daily. 07/27/23  Yes [name], Christy A, FNP  gabapentin (NEURONTIN) 100 MG capsule Take 1 capsule (100 mg total) by mouth 3 (three) times daily. 07/27/23  Yes [name], Christy A, FNP  glimepiride (AMARYL) 4 MG tablet Take 1 tablet (4 mg total) by mouth daily with breakfast. 07/27/23  Yes [name], Christy A, FNP  levothyroxine (SYNTHROID) 150 MCG tablet Take 1 tablet (150 mcg total) by mouth daily. 07/27/23  Yes [name], Christy A, FNP  metFORMIN (GLUCOPHAGE) 1000 MG tablet Take 1 tablet (1,000 mg total) by mouth 2 (two) times daily. 07/27/23  Yes [name], Christy A, FNP  nitroGLYCERIN (NITROSTAT) 0.4 MG SL tablet DISSOLVE ONE TABLET UNDER THE TONGUE EVERY 5 MINUTES AS NEEDED FOR CHEST PAIN.  DO NOT EXCEED A TOTAL OF 3 DOSES IN 15 MINUTES 07/27/23  Yes [name], [place] A, FNP  potassium chloride SA (KLOR-CON M) 20 MEQ tablet TAKE 1 TABLET BY MOUTH DAILY 01/15/23  Yes Rollene Rotunda, MD  rosuvastatin (CRESTOR) 40 MG tablet Take 1  tablet (40 mg total) by mouth daily. Patient taking differently: Take 40 mg by mouth in the morning. 04/11/23  Yes Rollene Rotunda, MD  torsemide (DEMADEX) 20 MG tablet Take 2 tablets (40 mg total) by mouth daily. Patient taking differently: Take 40 mg by mouth in the morning. 07/27/23  Yes [name], Christy A, FNP  Vitamin D, Ergocalciferol, (DRISDOL) 1.25 MG (50000 UNIT) CAPS capsule Take 1 capsule by mouth once a week Patient taking differently: Take 50,000 Units by mouth once a week. On Wednesday 06/14/23  Yes [name], Neysa Bonito A, FNP  cetirizine (ZYRTEC) 10 MG tablet  Take 1 tablet (10 mg total) by mouth daily. Patient not taking: Reported on 07/27/2023 04/07/21   [name] A, FNP  glucose blood (ONETOUCH VERIO) test strip CHECK BLOOD SUGAR DAILY Dx E11.9 03/28/23   [name], FNP  Lancets [facility] DELICA PLUS [place]) MISC CHECK BLOOD SUGAR DAILY Dx E11.9 08/14/23   [name], FNP    Past Medical History: Past Medical History:  Diagnosis Date   Atrial fibrillation (HCC)    CAD (coronary artery disease)    a. 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   TAVR on 01/20/2020 (29 mm [employer] )   HLD (hyperlipidemia)    HTN (hypertension)    Obesity    Renal insufficiency    Subclavian artery stenosis, right (HCC)    based upon carotid U/S done 11/2012    Past Surgical History: Past Surgical History:  Procedure Laterality Date   CATARACT EXTRACTION Left    CORONARY ARTERY BYPASS GRAFT     2011, LIMA to LAD coronary artery, SVG to OM2 branch of lect circumflex coronary artery, and a sequential SVG to psot descening to posterolateral branches to RCA   endoscopic vein harvesting     right leg    HERNIA REPAIR     RIGHT/LEFT HEART CATH AND CORONARY/GRAFT ANGIOGRAPHY N/A 12/31/2017   Procedure: RIGHT/LEFT HEART CATH AND CORONARY/GRAFT ANGIOGRAPHY;  Surgeon: Marykay Lex, MD;  Location: [facility] INVASIVE CV LAB;  Service: Cardiovascular;  Laterality: N/A;   RIGHT/LEFT HEART CATH AND CORONARY/GRAFT ANGIOGRAPHY N/A 12/30/2019   Procedure: RIGHT/LEFT HEART CATH AND CORONARY/GRAFT ANGIOGRAPHY;  Surgeon: Lyn Records, MD;  Location: [facility] INVASIVE CV LAB;  Service: Cardiovascular;  Laterality: N/A;   TEE  WITHOUT CARDIOVERSION N/A 01/20/2020   Procedure: TRANSESOPHAGEAL ECHOCARDIOGRAM (TEE);  Surgeon: Tonny Bollman, MD;  Location: [facility] OR;  Service: Open Heart Surgery;  Laterality: N/A;   TONSILLECTOMY     TRANSCATHETER AORTIC VALVE REPLACEMENT, TRANSFEMORAL N/A 01/20/2020   Procedure: TRANSCATHETER AORTIC VALVE REPLACEMENT, TRANSFEMORALUSING EDWARDS SAPIEN 3 29 MM AORTIC THV.;  Surgeon: Tonny Bollman, MD;  Location: [facility] OR;  Service: Open Heart Surgery;  Laterality: N/A;    Family History: Family History  Problem Relation Age of Onset   Heart disease Father    Heart attack Father    Mental illness Mother    Mental illness Sister    Diabetes Brother    Depression Maternal Aunt     Social History: Social History   Socioeconomic History   Marital status: Married    Spouse name:  Lions   Number  of children: 3   Years of education: 8   Highest education level: 8th grade  Occupational History   Occupation: Retired    Comment: scrap yard  Tobacco Use   Smoking status: Former    Current packs/day: 0.00    Types: Cigarettes    Quit date: 07/11/2009    Years since quitting: 14.1   Smokeless tobacco: Never   Tobacco comments:    smoked about 10 cig/day; used to smoke 2 ppd for many years   Vaping Use   Vaping status: Never Used  Substance and Sexual Activity   Alcohol use: No    Alcohol/week: 0.0 standard drinks of alcohol   Drug use: No   Sexual activity: Never  Other Topics Concern   Not on file  Social History Narrative   Lives at home with his wife. He is retired but his wife continues to work daily. They have adult children that live locally and grandchildren that they spend time with regularly. He is active around his home and yard.    Social Drivers of Corporate investment banker Strain: Low Risk  (08/25/2022)   Overall Financial Resource Strain (CARDIA)    Difficulty of Paying Living Expenses: Not hard at all  Food Insecurity: No Food Insecurity (08/13/2023)   Hunger  Vital Sign    Worried About Running Out of Food in the Last Year: Never true    Ran Out of Food in the Last Year: Never true  Transportation Needs: No Transportation Needs (08/13/2023)   PRAPARE - Administrator, Civil Service (Medical): No    Lack of Transportation (Non-Medical): No  Physical Activity: Insufficiently Active (08/25/2022)   Exercise Vital Sign    Days of Exercise per Week: 3 days    Minutes of Exercise per Session: 30 min  Stress: No Stress Concern Present (08/25/2022)   [employer] of Occupational Health - Occupational Stress Questionnaire    Feeling of Stress : Not at all  Social Connections: Moderately Isolated (08/13/2023)   Social Connection and Isolation Panel [NHANES]    Frequency of Communication with Friends and Family: Three times a week    Frequency of Social Gatherings with Friends and Family: Once a week    Attends Religious Services: Never    Database administrator or Organizations: No    Attends Banker Meetings: Never    Marital Status: Married    Allergies:  Allergies  Allergen Reactions   Prednisone Other (See Comments)    Pt states "sugar went to 580"   Januvia [Sitagliptin] Nausea And Vomiting   Jardiance [Empagliflozin] Rash    Tolerated Farxiga samples   Lisinopril Cough    Objective:    Vital Signs:   Temp:  [97.5 F (36.4 C)-98.1 F (36.7 C)] 98 F (36.7 C) (03/12 0835) Pulse Rate:  [63-125] 123 (03/12 1013) Resp:  [20-38] 31 (03/12 1013) BP: (84-122)/(62-96) 92/66 (03/12 1013) SpO2:  [94 %-99 %] 94 % (03/12 1013) Weight:  [102.9 kg] 102.9 kg (03/12 0500) Last BM Date : 08/13/23  Weight change: Filed Weights   08/13/23 1142 08/15/23 0500  Weight: 103.4 kg 102.9 kg    Intake/Output:   Intake/Output Summary (Last 24 hours) at 08/15/2023 1056 Last data filed at 08/15/2023 0518 Gross per 24 hour  Intake 514.46 ml  Output 2050 ml  Net -1535.54 ml      Physical Exam    General:  Increased  WOB. Appears weak.  Neck: supple.  JVP elevated.  Cor: PMI nondisplaced. Tach Irregular rate & rhythm. No rubs, gallops or murmurs. Lungs: Decreased Abdomen: soft, nontender, nondistended.  Extremities: no cyanosis, clubbing, rash, edema Neuro: alert & oriented x3.   Telemetry  A Flutter  RVR 120s   EKG   On admit A flutter RVR 1141   Labs   Basic Metabolic Panel: Recent Labs  Lab 08/13/23 1151 08/15/23 0755  NA 136 132*  K 3.9 3.7  CL 100 95*  CO2 25 26  GLUCOSE 157* 96  BUN 17 27*  CREATININE 1.01 1.54*  CALCIUM 8.6* 8.3*    Liver Function Tests: No results for input(s): "AST", "ALT", "ALKPHOS", "BILITOT", "PROT", "ALBUMIN" in the last 168 hours. No results for input(s): "LIPASE", "AMYLASE" in the last 168 hours. No results for input(s): "AMMONIA" in the last 168 hours.  CBC: Recent Labs  Lab 08/13/23 1151 08/14/23 0511 08/15/23 0755  WBC 12.1* 12.2* 11.8*  HGB 11.8* 12.9* 12.8*  HCT 38.0* 41.6 39.4  MCV 86.6 84.7 85.5  PLT 164 192 185    Cardiac Enzymes: No results for input(s): "CKTOTAL", "CKMB", "CKMBINDEX", "TROPONINI" in the last 168 hours.  BNP: BNP (last 3 results) Recent Labs    08/13/23 1151 08/14/23 0511  BNP 324.7* 377.8*    ProBNP (last 3 results) No results for input(s): "PROBNP" in the last 8760 hours.   CBG: Recent Labs  Lab 08/13/23 1413  GLUCAP 141*    Coagulation Studies: No results for input(s): "LABPROT", "INR" in the last 72 hours.   Imaging   [place] EKG SITE RITE Result Date: 08/15/2023 If Site Rite image not attached, placement could not be confirmed due to current cardiac rhythm.  ECHOCARDIOGRAM COMPLETE Result Date: 08/14/2023    ECHOCARDIOGRAM REPORT   Patient Name:   MYKAH SHIN Date of Exam: 08/14/2023 Medical Rec #:  [id number]        Height:       67.0 in Accession #:    [id number]       Weight:       228.0 lb Date of Birth:  31-May-1946         BSA:          2.138 m Patient Age:    78 years         BP:            104/89 mmHg Patient Gender: M                HR:           122 bpm. Exam Location:  Inpatient Procedure: 2D Echo, Cardiac Doppler, Color Doppler and Intracardiac            Opacification Agent (Both Spectral and Color Flow Doppler were            utilized during procedure). Indications:     CAD native vessel I25.10  History:         Patient has prior history of Echocardiogram examinations, most                  recent 06/08/2022. CAD, COPD; Risk Factors:Dyslipidemia, Diabetes                  and Hypertension.  Sonographer:     Harriette Bouillon RDCS Referring Phys:  [id number] Yates Decamp Diagnosing Phys: Arvilla Meres MD IMPRESSIONS  1. Left ventricular ejection fraction, by estimation, is 20 to 25%. The left ventricle has severely decreased function. The  left ventricle demonstrates global hypokinesis. There is mild concentric left ventricular hypertrophy. Left ventricular diastolic  function could not be evaluated.  2. Right ventricular systolic function is severely reduced. The right ventricular size is mildly enlarged. Tricuspid regurgitation signal is inadequate for assessing PA pressure.  3. Left atrial size was mildly dilated.  4. The mitral valve is normal in structure. Trivial mitral valve regurgitation. No evidence of mitral stenosis.  5. The aortic valve has been repaired/replaced. Aortic valve regurgitation is not visualized. No aortic stenosis is present. Echo findings are consistent with normal structure and function of the aortic valve prosthesis. FINDINGS  Left Ventricle: Left ventricular ejection fraction, by estimation, is 20 to 25%. The left ventricle has severely decreased function. The left ventricle demonstrates global hypokinesis. Definity contrast agent was given IV to delineate the left ventricular endocardial borders. The left ventricular internal cavity size was normal in size. There is mild concentric left ventricular hypertrophy. Abnormal (paradoxical) septal motion, consistent with left  bundle branch block. Left ventricular diastolic function could not be evaluated due to atrial fibrillation. Left ventricular diastolic function could not be evaluated. Right Ventricle: The right ventricular size is mildly enlarged. No increase in right ventricular wall thickness. Right ventricular systolic function is severely reduced. Tricuspid regurgitation signal is inadequate for assessing PA pressure. Left Atrium: Left atrial size was mildly dilated. Right Atrium: Right atrial size was normal in size. Pericardium: There is no evidence of pericardial effusion. Mitral Valve: The mitral valve is normal in structure. Mild mitral annular calcification. Trivial mitral valve regurgitation. No evidence of mitral valve stenosis. Tricuspid Valve: The tricuspid valve is normal in structure. Tricuspid valve regurgitation is trivial. No evidence of tricuspid stenosis. Aortic Valve: The aortic valve has been repaired/replaced. Aortic valve regurgitation is not visualized. No aortic stenosis is present. There is a 29 mm Sapien prosthetic, stented (TAVR) valve present in the aortic position. Echo findings are consistent with normal structure and function of the aortic valve prosthesis. Pulmonic Valve: The pulmonic valve was normal in structure. Pulmonic valve regurgitation is trivial. No evidence of pulmonic stenosis. Aorta: The aortic root is normal in size and structure. Venous: The inferior vena cava was not well visualized. IAS/Shunts: No atrial level shunt detected by color flow Doppler.  LEFT VENTRICLE PLAX 2D LVIDd:         4.60 cm     Diastology LVIDs:         4.20 cm     LV e' medial: 4.46 cm/s LV PW:         1.20 cm LV IVS:        1.30 cm LVOT diam:     2.10 cm LV SV:         20 LV SV Index:   9 LVOT Area:     3.46 cm  LV Volumes (MOD) LV vol d, MOD A4C: 60.5 ml LV vol s, MOD A4C: 57.7 ml LV SV MOD A4C:     60.5 ml RIGHT VENTRICLE TAPSE (M-mode): 0.8 cm LEFT ATRIUM             Index        RIGHT ATRIUM            Index LA diam:        4.70 cm 2.20 cm/m   RA Area:     15.10 cm LA Vol (A2C):   37.7 ml 17.63 ml/m  RA Volume:   39.00 ml  18.24 ml/m LA Vol (A4C):  70.1 ml 32.79 ml/m LA Biplane Vol: 55.4 ml 25.91 ml/m  AORTIC VALVE LVOT Vmax:   35.80 cm/s LVOT Vmean:  23.700 cm/s LVOT VTI:    0.057 m  AORTA Ao Root diam: 2.70 cm Ao Asc diam:  3.30 cm  SHUNTS Systemic VTI:  0.06 m Systemic Diam: 2.10 cm Arvilla Meres MD Electronically signed by Arvilla Meres MD Signature Date/Time: 08/14/2023/11:18:48 AM    Final (Updated)      Medications:     Current Medications:  aspirin EC  81 mg Oral Daily   dapagliflozin propanediol  10 mg Oral Daily   feeding supplement  237 mL Oral BID BM   fluticasone furoate-vilanterol  1 puff Inhalation Daily   And   umeclidinium bromide  1 puff Inhalation Daily   gabapentin  100 mg Oral TID   glimepiride  4 mg Oral Q breakfast   levothyroxine  150 mcg Oral Daily   loratadine  10 mg Oral Daily   potassium chloride SA  20 mEq Oral Daily   rosuvastatin  40 mg Oral Daily   Vitamin D (Ergocalciferol)  50,000 Units Oral Weekly    Infusions:  amiodarone 60 mg/hr (08/15/23 1022)   Followed by   amiodarone     furosemide (LASIX) 200 mg in dextrose 5 % 100 mL (2 mg/mL) infusion     heparin 1,600 Units/hr (08/15/23 0209)      Patient Profile   Matthew May is  a 78 year old with a history of CAD, CABG 2011, severe AS TAVR 2021, HTN,COPD, HLD, DMII, and PAF.    Assessment/Plan  1. Acute Biventricular HFrEF     CAD suspected completed infarct.  Suspect mixed etiology with CAD and tachy mediated. Echo with EF down to 20-25% from previous 70-75%. Lactic acid 1.3  CO-OX once PICC placed.  - Increased WOB. Diuresing IV lasix. CXR now.  - BB stopped today. - Hold GDMT   2. Acute Respiratory Distress Increased WOB with respirations in the 30s. CXR now. Check ABG.  O2 sats low 90s Giving higher dose of IV lasix.   3. A flutter  EkG at PCP in [place] looked  like A fib RVR. He declined further work up per his wife.  -A Flutter RVR on admit. -TSH ok.  -On amio drip+ heparin drip.  - Will need eventual TEE/Cardioversion once stabilized.   4.AKI -Creatinine on admit 1-->1.5 today  - Follow BMET daily   5. 4.CAD H/O CABG 2011 LIMA to LAD, SVG to OM and sequential SVG to PDA and PL branch -Dr Nadara Eaton reviewed. Chest started a few weeks ago. Suspected completed infart. HS Trop 1610>9604 -On Aspirin +crestor. Holding bb due to possible shock.  6.DNR  Moving to ICU   Length of Stay: 2  Moriah Shawley, NP  08/15/2023, 10:56 AM  Advanced Heart Failure Team Pager [phone] (M-F; 7a - 5p)  Please contact [facility] for night-coverage after hours (4p -7a ) and weekends on [email or web address]

## 2023-08-15 NOTE — Progress Notes (Signed)
 Patient Name: Matthew May Date of Encounter: 08/15/2023 [facility] Cardiologist: Rollene Rotunda, MD   Interval Summary  .    Patient reports feeling poorly this AM. Continues to feel short of breath. No chest pain or palpitations. Feels short of breath when sitting upright and when laying back. Per telemetry, he remains in atrial flutter with HR elevated to the 120s.   Vital Signs .    Vitals:   08/14/23 1541 08/14/23 1946 08/14/23 2356 08/15/23 0500  BP: 98/67 100/76 (!) 85/62 122/85  Pulse: 85 63 75 (!) 124  Resp: (!) 22 (!) 22 20 20   Temp: 97.6 F (36.4 C) 98.1 F (36.7 C) 97.6 F (36.4 C) (!) 97.5 F (36.4 C)  TempSrc: Oral Oral Oral Oral  SpO2: 99% 97% 98% 95%  Weight:    102.9 kg  Height:        Intake/Output Summary (Last 24 hours) at 08/15/2023 0840 Last data filed at 08/15/2023 0518 Gross per 24 hour  Intake 514.46 ml  Output 2150 ml  Net -1635.54 ml      08/15/2023    5:00 AM 08/13/2023   11:42 AM 07/27/2023    9:29 AM  Last 3 Weights  Weight (lbs) 226 lb 13.7 oz 228 lb 228 lb 3.2 oz  Weight (kg) 102.9 kg 103.42 kg 103.511 kg      Telemetry/ECG    Atrial flutter, HR in the 120s - Personally Reviewed  Physical Exam .   GEN: No acute distress. Sitting upright in the bed  Neck: No JVD Cardiac: Irregular rate and rhythm, tachycardic. no murmurs, rubs, or gallops.  Respiratory: Crackles in bilateral lung bases. Increased WOB on nasal cannula  GI: Soft, nontender, non-distended  MS: No edema in BLE   Assessment & Plan .     Acute Systolic Heart Failure  Ischemic cardiomyopathy  Biventricular Heart Failure  - Patient initially presented on 3/10 complaining of chest pain. Reportedly hd chest tightness/heaviness that started 2 weeks ago and lasted about 1 week. Since then, he had generalized weakness and lack of appetite.  - In the ED, a code STEMI was activated. Dr. Jacinto Halim reviewed- there were Q waves and persistent ST elevation in V1-V4.  Patient was not having chest pain. Suspected that he had completed his anteroseptal infarct 2 weeks ago. Also possible that flutter waves were read as ST elevation  - Echo this admission showed EF 20-25% (down from 70-75% in 06/2022). Echo this admission also showed mild LVH, severely reduced RV systolic function  - Suspect ischemic cardiomyopathy following infarct. Consider cardiac MRI for viability. - BNP elevated to 377.8. Patient now on IV lasix 40 mg BID. Put out 2.15 L urine yesterday. Continues to have crackles in lung bases  - Creatinine bumped 1.01>1.54  - With his increased WOB and crackles in lung bases on exam, continue lasix for now. I wonder if rise in creatinine is related to low BP. Hold metoprolol for now given drop in EF and low BP  - Check lactic acid. Patient is overall warm and well perfused on exam. Has good urine output - BP soft, limiting aggressive GDMT. Continue farxiga 10 mg daily  NSTEMI  History of CABG  - patient previously had CABG in 2011 with LIMA-LAD, SVG-OM1, SVG-PDA and PL branch of RCA - As above, patient had chest pain 2 weeks ago that since improved. hsTn [phone] - With echo showing EF down to 20-25% with severely reduced RV function, suspect patient  completed infarct 2 weeks ago  - Currently on IV heparin  - Consider cardiac MRI for viability. However, patient is DNR. He could not currently tolerate cath as he could not lay flat. May be better to continue medical management  - Continue IV heparin  - Continue ASA, crestor   Atrial Flutter  History of PAF  - Patient has a history of PAF, presented to the ED in new onset atrial flutter - Treated with IV amiodarone and was transitioned to PO amiodarone yesterday evening. HR was well controlled overnight, but he is again in RVR this AM  - Restart IV amiodarone  - As above, holding metoprolol with low BP  - Currently on IV heparin in case patient needs to undergo cath this admission   S/p TAVR  -  Underwent TAVR in 2021  - Echo this admission showed normal structure and function of the aortic valve prosthesis   Type 2 DM  - Continue glimepiride and farxiga   For questions or updates, please contact [facility] Please consult [email or web address] for contact info under        Signed, Jonita Albee, PA-C

## 2023-08-15 NOTE — Progress Notes (Signed)
 PHARMACY - ANTICOAGULATION CONSULT NOTE  Pharmacy Consult for heparin IV Infusion Indication: chest pain/ACS  Allergies  Allergen Reactions   Prednisone Other (See Comments)    Pt states "sugar went to 580"   Januvia [Sitagliptin] Nausea And Vomiting   Jardiance [Empagliflozin] Rash    Tolerated Farxiga samples   Lisinopril Cough    Patient Measurements: Height: 5\' 7"  (170.2 cm) Weight: 102.9 kg (226 lb 13.7 oz) IBW/kg (Calculated) : 66.1 Heparin Dosing Weight: 89 kg  Vital Signs: Temp: 97.7 F (36.5 C) (03/12 1600) Temp Source: Axillary (03/12 1600) BP: 117/72 (03/12 2100) Pulse Rate: 125 (03/12 2100)  Labs: Recent Labs    08/13/23 1151 08/13/23 1151 08/13/23 1344 08/13/23 1830 08/13/23 2000 08/14/23 0511 08/14/23 1233 08/14/23 2204 08/15/23 0755  HGB 11.8*  --   --   --   --  12.9*  --   --  12.8*  HCT 38.0*  --   --   --   --  41.6  --   --  39.4  PLT 164  --   --   --   --  192  --   --  185  APTT  --    < >  --   --   --  41* 46* 63* 62*  HEPARINUNFRC  --   --   --    < > >1.10* >1.10*  --   --  >1.10*  CREATININE 1.01  --   --   --   --   --   --   --  1.54*  TROPONINIHS 2,774*  --  2,562*  --   --   --   --   --   --    < > = values in this interval not displayed.    Estimated Creatinine Clearance: 46.6 mL/min (A) (by C-G formula based on SCr of 1.54 mg/dL (H)).   Assessment: Matthew May is a 78 y.o. year old male admitted on 08/13/2023 with CP and concern for unstable angina. Eliquis prior to admission for afib (last dose 3/10 @ 0530). Pharmacy consulted to dose heparin.  aPTT 112 sec (slightly supra-therapeutic) on infusion at 1800 units/hr. No issues with line or bleeding reported per nursing. Confirmed aPTT was drawn away from heparin infusion (heparin via L PIV) and aPTT via PICC in RUE and was flushed.   Goal of Therapy:  Heparin level 0.3-0.7 units/ml aPTT 66-102 secs Monitor platelets by anticoagulation protocol: Yes   Plan:  Decrease  heparin infusion to 1700 units/h Recheck aPTT in AM.  Daily heparin level, aPTT and CBC.  Estill Batten, PharmD, BCCCP  Clinical Pharmacist  08/15/2023 9:20 PM   [facility] pharmacy phone numbers are listed on [email or web address]

## 2023-08-15 NOTE — H&P (Signed)
 Please see consult note.

## 2023-08-15 NOTE — Telephone Encounter (Signed)
 Patient identification verified by 2 forms. Matthew Rail, RN    Called and spoke to patients Son Matthew May states:   -patient is currently admitted   -since admission patient has not been seen by cardiology   -concerned about care patient is receiving  Informed [name]   -Per chart review patient has cardiology consult, note available from Dr. Wyline Mood 3/11   -best to communicate with hospital team regarding care and concerns   -follow up once patient is discharged  [name] verbalized understanding, no questions at this time

## 2023-08-15 NOTE — Plan of Care (Signed)

## 2023-08-15 NOTE — Progress Notes (Signed)
 Peripherally Inserted Central Catheter Placement  The IV Nurse has discussed with the patient and/or persons authorized to consent for the patient, the purpose of this procedure and the potential benefits and risks involved with this procedure.  The benefits include less needle sticks, lab draws from the catheter, and the patient may be discharged home with the catheter. Risks include, but not limited to, infection, bleeding, blood clot (thrombus formation), and puncture of an artery; nerve damage and irregular heartbeat and possibility to perform a PICC exchange if needed/ordered by physician.  Alternatives to this procedure were also discussed.  Bard Power PICC patient education guide, fact sheet on infection prevention and patient information card has been provided to patient /or left at bedside.    PICC Placement Documentation  PICC Triple Lumen 08/15/23 Right Brachial 41 cm 0 cm (Active)  Indication for Insertion or Continuance of Line Prolonged intravenous therapies;Vasoactive infusions 08/15/23 1314  Exposed Catheter (cm) 0 cm 08/15/23 1314  Site Assessment Clean, Dry, Intact 08/15/23 1314  Lumen #1 Status Flushed;Saline locked;Blood return noted 08/15/23 1314  Lumen #2 Status Flushed;Saline locked;Blood return noted 08/15/23 1314  Lumen #3 Status Flushed;Saline locked;Blood return noted 08/15/23 1314  Dressing Type Transparent;Securing device 08/15/23 1314  Dressing Status Antimicrobial disc/dressing in place;Clean, Dry, Intact 08/15/23 1314  Line Care Connections checked and tightened 08/15/23 1314  Line Adjustment (NICU/IV Team Only) No 08/15/23 1314  Dressing Intervention New dressing;Adhesive placed at insertion site (IV team only);Other (Comment) 08/15/23 1314  Dressing Change Due 08/25/2023 08/15/23 1314    Patient's wife, Matthew May, signed consent at bedside prior to PICC insertion.   Annett Fabian 08/15/2023, 1:15 PM

## 2023-08-15 NOTE — Progress Notes (Signed)
   Transferred to 2h for HF optimization.   PICC placed. CO-OX stable. Remains in A fib RVR. Continue amio and heparin drip. Continue lasix drip.   Set up CVP.  Plan cath in next 24-48 hours followed by TEE DC-CV.   Uri Covey NP-C  2:52 PM

## 2023-08-15 NOTE — Consult Note (Addendum)
 Advanced Heart Failure Team Consult Note   Primary Physician: Matthew Spencer, FNP Cardiologist:  Rollene Rotunda, MD  Reason for Consultation: Heart Failure/ Cardiogenic Shock  HPI:    Matthew May is seen today for evaluation of heart failure and suspected cardiogenic shock at the request of  Matthew May.   Matthew May is  a 78 year old with a history of CAD, CABG 2011, severe AS TAVR 2021, HTN,COPD, HLD, DMII, and PAF. He has been on elqiuis since 2021.   Most recent Echo 2024 EF 70-75%  In February he saw Matthew Rodney NP for PCP visit. - EKG A fib RVR 120 bpm. His wife said he did not want seek further treatment and went back home.   Started having intermittent chest pain a couple weeks ago. His wife tells me he was weaker everyday and he wouldn't tell them her if he was having chest pain. He started having a hard time moving around and had poor appetite.   Presented to ED via EMS with weakness and poor appetite.His wife convinced him to call EMS. EMS EKG with ST elevated V1-V4. CODE STEMI called. Matthew Nadara Eaton reviewed suspected completed infarct. CODE STEMI cancelled.  BNP 377. EKG A flutter RVR. Diuresing with IV lasix. Negative 1.6 liters.   Started on amio drip and heparin drip.  Diuresing with intermittent IV lasix. Echo EF 20-25% RV severely reduced. Today BP soft. PICC ordered. Concern for cardiogenic shock with AKI/hypotension. Complaining of shortness of breath. Lactic acid 1.3    Home Medications Prior to Admission medications   Medication Sig Start Date End Date Taking? Authorizing Provider  acetaminophen (TYLENOL) 325 MG tablet Take 2 tablets (650 mg total) by mouth every 6 (six) hours as needed for mild pain (or Fever >/= 101). 01/21/20  Yes Roddenberry, Cecille [place], PA-C  albuterol (VENTOLIN HFA) 108 (90 Base) MCG/ACT inhaler Inhale 1-2 puffs into the lungs every 6 (six) hours as needed for wheezing or shortness of breath. 07/27/23  Yes [name], Christy A, FNP   Budeson-Glycopyrrol-Formoterol (BREZTRI AEROSPHERE) 160-9-4.8 MCG/ACT AERO Inhale 2 puffs into the lungs 2 (two) times daily. 12/28/22  Yes [name], Christy A, FNP  dapagliflozin propanediol (FARXIGA) 10 MG TABS tablet Take 1 tablet (10 mg total) by mouth daily. Patient taking differently: Take 10 mg by mouth in the morning. 07/27/23  Yes [name], Christy A, FNP  ELIQUIS 5 MG TABS tablet Take 1 tablet (5 mg total) by mouth 2 (two) times daily. 07/27/23  Yes [name], Christy A, FNP  gabapentin (NEURONTIN) 100 MG capsule Take 1 capsule (100 mg total) by mouth 3 (three) times daily. 07/27/23  Yes [name], Christy A, FNP  glimepiride (AMARYL) 4 MG tablet Take 1 tablet (4 mg total) by mouth daily with breakfast. 07/27/23  Yes [name], Christy A, FNP  levothyroxine (SYNTHROID) 150 MCG tablet Take 1 tablet (150 mcg total) by mouth daily. 07/27/23  Yes [name], Christy A, FNP  metFORMIN (GLUCOPHAGE) 1000 MG tablet Take 1 tablet (1,000 mg total) by mouth 2 (two) times daily. 07/27/23  Yes [name], Christy A, FNP  nitroGLYCERIN (NITROSTAT) 0.4 MG SL tablet DISSOLVE ONE TABLET UNDER THE TONGUE EVERY 5 MINUTES AS NEEDED FOR CHEST PAIN.  DO NOT EXCEED A TOTAL OF 3 DOSES IN 15 MINUTES 07/27/23  Yes [name], [place] A, FNP  potassium chloride SA (KLOR-CON M) 20 MEQ tablet TAKE 1 TABLET BY MOUTH DAILY 01/15/23  Yes Rollene Rotunda, MD  rosuvastatin (CRESTOR) 40 MG tablet Take 1  tablet (40 mg total) by mouth daily. Patient taking differently: Take 40 mg by mouth in the morning. 04/11/23  Yes Rollene Rotunda, MD  torsemide (DEMADEX) 20 MG tablet Take 2 tablets (40 mg total) by mouth daily. Patient taking differently: Take 40 mg by mouth in the morning. 07/27/23  Yes [name], Christy A, FNP  Vitamin D, Ergocalciferol, (DRISDOL) 1.25 MG (50000 UNIT) CAPS capsule Take 1 capsule by mouth once a week Patient taking differently: Take 50,000 Units by mouth once a week. On Wednesday 06/14/23  Yes [name], Neysa Bonito A, FNP  cetirizine (ZYRTEC) 10 MG tablet  Take 1 tablet (10 mg total) by mouth daily. Patient not taking: Reported on 07/27/2023 04/07/21   Matthew May A, FNP  glucose blood (ONETOUCH VERIO) test strip CHECK BLOOD SUGAR DAILY Dx E11.9 03/28/23   Matthew Spencer, FNP  Lancets [facility] DELICA PLUS [place]) MISC CHECK BLOOD SUGAR DAILY Dx E11.9 08/14/23   Matthew Spencer, FNP    Past Medical History: Past Medical History:  Diagnosis Date   Atrial fibrillation (HCC)    CAD (coronary artery disease)    a. 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   TAVR on 01/20/2020 (29 mm [employer]WARDS [employer]IF[employer]CI[employer]ES )   HLD (hyperlipidemia)    HTN (hypertension)    Obesity    Renal insufficiency    Subclavian artery stenosis, right (HCC)    based upon carotid U/S done 11/2012    Past Surgical History: Past Surgical History:  Procedure Laterality Date   CATARACT EXTRACTION Left    CORONARY ARTERY BYPASS GRAFT     2011, LIMA to LAD coronary artery, SVG to OM2 branch of lect circumflex coronary artery, and a sequential SVG to psot descening to posterolateral branches to RCA   endoscopic vein harvesting     right leg    HERNIA REPAIR     RIGHT/LEFT HEART CATH AND CORONARY/GRAFT ANGIOGRAPHY N/A 12/31/2017   Procedure: RIGHT/LEFT HEART CATH AND CORONARY/GRAFT ANGIOGRAPHY;  Surgeon: Marykay Lex, MD;  Location: [facility] INVASIVE CV LAB;  Service: Cardiovascular;  Laterality: N/A;   RIGHT/LEFT HEART CATH AND CORONARY/GRAFT ANGIOGRAPHY N/A 12/30/2019   Procedure: RIGHT/LEFT HEART CATH AND CORONARY/GRAFT ANGIOGRAPHY;  Surgeon: Lyn Records, MD;  Location: [facility] INVASIVE CV LAB;  Service: Cardiovascular;  Laterality: N/A;   TEE  WITHOUT CARDIOVERSION N/A 01/20/2020   Procedure: TRANSESOPHAGEAL ECHOCARDIOGRAM (TEE);  Surgeon: Tonny Bollman, MD;  Location: [facility] OR;  Service: Open Heart Surgery;  Laterality: N/A;   TONSILLECTOMY     TRANSCATHETER AORTIC VALVE REPLACEMENT, TRANSFEMORAL N/A 01/20/2020   Procedure: TRANSCATHETER AORTIC VALVE REPLACEMENT, TRANSFEMORALUSING EDWARDS SAPIEN 3 29 MM AORTIC THV.;  Surgeon: Tonny Bollman, MD;  Location: [facility] OR;  Service: Open Heart Surgery;  Laterality: N/A;    Family History: Family History  Problem Relation Age of Onset   Heart disease Father    Heart attack Father    Mental illness Mother    Mental illness Sister    Diabetes Brother    Depression Maternal Aunt     Social History: Social History   Socioeconomic History   Marital status: Married    Spouse name:  Lions   Number  of children: 3   Years of education: 8   Highest education level: 8th grade  Occupational History   Occupation: Retired    Comment: scrap yard  Tobacco Use   Smoking status: Former    Current packs/day: 0.00    Types: Cigarettes    Quit date: 07/11/2009    Years since quitting: 14.1   Smokeless tobacco: Never   Tobacco comments:    smoked about 10 cig/day; used to smoke 2 ppd for many years   Vaping Use   Vaping status: Never Used  Substance and Sexual Activity   Alcohol use: No    Alcohol/week: 0.0 standard drinks of alcohol   Drug use: No   Sexual activity: Never  Other Topics Concern   Not on file  Social History Narrative   Lives at home with his wife. He is retired but his wife continues to work daily. They have adult children that live locally and grandchildren that they spend time with regularly. He is active around his home and yard.    Social Drivers of Corporate investment banker Strain: Low Risk  (08/25/2022)   Overall Financial Resource Strain (CARDIA)    Difficulty of Paying Living Expenses: Not hard at all  Food Insecurity: No Food Insecurity (08/13/2023)   Hunger  Vital Sign    Worried About Running Out of Food in the Last Year: Never true    Ran Out of Food in the Last Year: Never true  Transportation Needs: No Transportation Needs (08/13/2023)   PRAPARE - Administrator, Civil Service (Medical): No    Lack of Transportation (Non-Medical): No  Physical Activity: Insufficiently Active (08/25/2022)   Exercise Vital Sign    Days of Exercise per Week: 3 days    Minutes of Exercise per Session: 30 min  Stress: No Stress Concern Present (08/25/2022)   [employer] of Occupational Health - Occupational Stress Questionnaire    Feeling of Stress : Not at all  Social Connections: Moderately Isolated (08/13/2023)   Social Connection and Isolation Panel [NHANES]    Frequency of Communication with Friends and Family: Three times a week    Frequency of Social Gatherings with Friends and Family: Once a week    Attends Religious Services: Never    Database administrator or Organizations: No    Attends Banker Meetings: Never    Marital Status: Married    Allergies:  Allergies  Allergen Reactions   Prednisone Other (See Comments)    Pt states "sugar went to 580"   Januvia [Sitagliptin] Nausea And Vomiting   Jardiance [Empagliflozin] Rash    Tolerated Farxiga samples   Lisinopril Cough    Objective:    Vital Signs:   Temp:  [97.5 F (36.4 C)-98.1 F (36.7 C)] 98 F (36.7 C) (03/12 0835) Pulse Rate:  [63-125] 123 (03/12 1013) Resp:  [20-38] 31 (03/12 1013) BP: (84-122)/(62-96) 92/66 (03/12 1013) SpO2:  [94 %-99 %] 94 % (03/12 1013) Weight:  [102.9 kg] 102.9 kg (03/12 0500) Last BM Date : 08/13/23  Weight change: Filed Weights   08/13/23 1142 08/15/23 0500  Weight: 103.4 kg 102.9 kg    Intake/Output:   Intake/Output Summary (Last 24 hours) at 08/15/2023 1056 Last data filed at 08/15/2023 0518 Gross per 24 hour  Intake 514.46 ml  Output 2050 ml  Net -1535.54 ml      Physical Exam    General:  Increased  WOB. Appears weak.  Neck: supple.  JVP elevated.  Cor: PMI nondisplaced. Tach Irregular rate & rhythm. No rubs, gallops or murmurs. Lungs: Decreased Abdomen: soft, nontender, nondistended.  Extremities: no cyanosis, clubbing, rash, edema Neuro: alert & oriented x3.   Telemetry  A Flutter  RVR 120s   EKG   On admit A flutter RVR 1141   Labs   Basic Metabolic Panel: Recent Labs  Lab 08/13/23 1151 08/15/23 0755  NA 136 132*  K 3.9 3.7  CL 100 95*  CO2 25 26  GLUCOSE 157* 96  BUN 17 27*  CREATININE 1.01 1.54*  CALCIUM 8.6* 8.3*    Liver Function Tests: No results for input(s): "AST", "ALT", "ALKPHOS", "BILITOT", "PROT", "ALBUMIN" in the last 168 hours. No results for input(s): "LIPASE", "AMYLASE" in the last 168 hours. No results for input(s): "AMMONIA" in the last 168 hours.  CBC: Recent Labs  Lab 08/13/23 1151 08/14/23 0511 08/15/23 0755  WBC 12.1* 12.2* 11.8*  HGB 11.8* 12.9* 12.8*  HCT 38.0* 41.6 39.4  MCV 86.6 84.7 85.5  PLT 164 192 185    Cardiac Enzymes: No results for input(s): "CKTOTAL", "CKMB", "CKMBINDEX", "TROPONINI" in the last 168 hours.  BNP: BNP (last 3 results) Recent Labs    08/13/23 1151 08/14/23 0511  BNP 324.7* 377.8*    ProBNP (last 3 results) No results for input(s): "PROBNP" in the last 8760 hours.   CBG: Recent Labs  Lab 08/13/23 1413  GLUCAP 141*    Coagulation Studies: No results for input(s): "LABPROT", "INR" in the last 72 hours.   Imaging   [place] EKG SITE RITE Result Date: 08/15/2023 If Site Rite image not attached, placement could not be confirmed due to current cardiac rhythm.  ECHOCARDIOGRAM COMPLETE Result Date: 08/14/2023    ECHOCARDIOGRAM REPORT   Patient Name:   MYKAH SHIN Date of Exam: 08/14/2023 Medical Rec #:  [id number]        Height:       67.0 in Accession #:    [id number]       Weight:       228.0 lb Date of Birth:  31-May-1946         BSA:          2.138 m Patient Age:    78 years         BP:            104/89 mmHg Patient Gender: M                HR:           122 bpm. Exam Location:  Inpatient Procedure: 2D Echo, Cardiac Doppler, Color Doppler and Intracardiac            Opacification Agent (Both Spectral and Color Flow Doppler were            utilized during procedure). Indications:     CAD native vessel I25.10  History:         Patient has prior history of Echocardiogram examinations, most                  recent 06/08/2022. CAD, COPD; Risk Factors:Dyslipidemia, Diabetes                  and Hypertension.  Sonographer:     Harriette Bouillon RDCS Referring Phys:  [id number] Yates Decamp Diagnosing Phys: Arvilla Meres MD IMPRESSIONS  1. Left ventricular ejection fraction, by estimation, is 20 to 25%. The left ventricle has severely decreased function. The  left ventricle demonstrates global hypokinesis. There is mild concentric left ventricular hypertrophy. Left ventricular diastolic  function could not be evaluated.  2. Right ventricular systolic function is severely reduced. The right ventricular size is mildly enlarged. Tricuspid regurgitation signal is inadequate for assessing PA pressure.  3. Left atrial size was mildly dilated.  4. The mitral valve is normal in structure. Trivial mitral valve regurgitation. No evidence of mitral stenosis.  5. The aortic valve has been repaired/replaced. Aortic valve regurgitation is not visualized. No aortic stenosis is present. Echo findings are consistent with normal structure and function of the aortic valve prosthesis. FINDINGS  Left Ventricle: Left ventricular ejection fraction, by estimation, is 20 to 25%. The left ventricle has severely decreased function. The left ventricle demonstrates global hypokinesis. Definity contrast agent was given IV to delineate the left ventricular endocardial borders. The left ventricular internal cavity size was normal in size. There is mild concentric left ventricular hypertrophy. Abnormal (paradoxical) septal motion, consistent with left  bundle branch block. Left ventricular diastolic function could not be evaluated due to atrial fibrillation. Left ventricular diastolic function could not be evaluated. Right Ventricle: The right ventricular size is mildly enlarged. No increase in right ventricular wall thickness. Right ventricular systolic function is severely reduced. Tricuspid regurgitation signal is inadequate for assessing PA pressure. Left Atrium: Left atrial size was mildly dilated. Right Atrium: Right atrial size was normal in size. Pericardium: There is no evidence of pericardial effusion. Mitral Valve: The mitral valve is normal in structure. Mild mitral annular calcification. Trivial mitral valve regurgitation. No evidence of mitral valve stenosis. Tricuspid Valve: The tricuspid valve is normal in structure. Tricuspid valve regurgitation is trivial. No evidence of tricuspid stenosis. Aortic Valve: The aortic valve has been repaired/replaced. Aortic valve regurgitation is not visualized. No aortic stenosis is present. There is a 29 mm Sapien prosthetic, stented (TAVR) valve present in the aortic position. Echo findings are consistent with normal structure and function of the aortic valve prosthesis. Pulmonic Valve: The pulmonic valve was normal in structure. Pulmonic valve regurgitation is trivial. No evidence of pulmonic stenosis. Aorta: The aortic root is normal in size and structure. Venous: The inferior vena cava was not well visualized. IAS/Shunts: No atrial level shunt detected by color flow Doppler.  LEFT VENTRICLE PLAX 2D LVIDd:         4.60 cm     Diastology LVIDs:         4.20 cm     LV e' medial: 4.46 cm/s LV PW:         1.20 cm LV IVS:        1.30 cm LVOT diam:     2.10 cm LV SV:         20 LV SV Index:   9 LVOT Area:     3.46 cm  LV Volumes (MOD) LV vol d, MOD A4C: 60.5 ml LV vol s, MOD A4C: 57.7 ml LV SV MOD A4C:     60.5 ml RIGHT VENTRICLE TAPSE (M-mode): 0.8 cm LEFT ATRIUM             Index        RIGHT ATRIUM            Index LA diam:        4.70 cm 2.20 cm/m   RA Area:     15.10 cm LA Vol (A2C):   37.7 ml 17.63 ml/m  RA Volume:   39.00 ml  18.24 ml/m LA Vol (A4C):  70.1 ml 32.79 ml/m LA Biplane Vol: 55.4 ml 25.91 ml/m  AORTIC VALVE LVOT Vmax:   35.80 cm/s LVOT Vmean:  23.700 cm/s LVOT VTI:    0.057 m  AORTA Ao Root diam: 2.70 cm Ao Asc diam:  3.30 cm  SHUNTS Systemic VTI:  0.06 m Systemic Diam: 2.10 cm Arvilla Meres MD Electronically signed by Arvilla Meres MD Signature Date/Time: 08/14/2023/11:18:48 AM    Final (Updated)      Medications:     Current Medications:  aspirin EC  81 mg Oral Daily   dapagliflozin propanediol  10 mg Oral Daily   feeding supplement  237 mL Oral BID BM   fluticasone furoate-vilanterol  1 puff Inhalation Daily   And   umeclidinium bromide  1 puff Inhalation Daily   gabapentin  100 mg Oral TID   glimepiride  4 mg Oral Q breakfast   levothyroxine  150 mcg Oral Daily   loratadine  10 mg Oral Daily   potassium chloride SA  20 mEq Oral Daily   rosuvastatin  40 mg Oral Daily   Vitamin D (Ergocalciferol)  50,000 Units Oral Weekly    Infusions:  amiodarone 60 mg/hr (08/15/23 1022)   Followed by   amiodarone     furosemide (LASIX) 200 mg in dextrose 5 % 100 mL (2 mg/mL) infusion     heparin 1,600 Units/hr (08/15/23 0209)      Patient Profile   Matthew Ezzell is  a 78 year old with a history of CAD, CABG 2011, severe AS TAVR 2021, HTN,COPD, HLD, DMII, and PAF.    Assessment/Plan  1. Acute Biventricular HFrEF     CAD suspected completed infarct.  Suspect mixed etiology with CAD and tachy mediated. Echo with EF down to 20-25% from previous 70-75%. Lactic acid 1.3  CO-OX once PICC placed.  - Increased WOB. Diuresing IV lasix. CXR now.  - BB stopped today. - Hold GDMT   2. Acute Respiratory Distress Increased WOB with respirations in the 30s. CXR now. Check ABG.  O2 sats low 90s Giving higher dose of IV lasix.   3. A flutter  EkG at PCP in [place] looked  like A fib RVR. He declined further work up per his wife.  -A Flutter RVR on admit. -TSH ok.  -On amio drip+ heparin drip.  - Will need eventual TEE/Cardioversion once stabilized.   4.AKI -Creatinine on admit 1-->1.5 today  - Follow BMET daily   5. 4.CAD H/O CABG 2011 LIMA to LAD, SVG to OM and sequential SVG to PDA and PL branch -Matthew Nadara Eaton reviewed. Chest started a few weeks ago. Suspected completed infart. HS Trop [id number] -On Aspirin +crestor. Holding bb due to possible shock.  6.DNR  Moving to ICU   Length of Stay: 2  Moriah Shawley, NP  08/15/2023, 10:56 AM  Advanced Heart Failure Team Pager [phone] (M-F; 7a - 5p)  Please contact [facility] for night-coverage after hours (4p -7a ) and weekends on [email or web address]

## 2023-08-15 NOTE — Progress Notes (Signed)
Heart Failure Navigator Progress Note  Assessed for Heart & Vascular TOC clinic readiness.  Patient does not meet criteria due to Advanced Heart Failure Team Dr. Aundra Dubin consulted. .   Navigator will sign off at this time.   Earnestine Leys, BSN, Clinical cytogeneticist Only

## 2023-08-15 NOTE — Telephone Encounter (Signed)
 Per pt's son - York Spaniel father has been in the hospital for 3 days and a cardiologist has not seen him yet

## 2023-08-15 NOTE — Progress Notes (Signed)
 PHARMACY - ANTICOAGULATION CONSULT NOTE  Pharmacy Consult for heparin IV Infusion Indication: chest pain/ACS  Allergies  Allergen Reactions   Prednisone Other (See Comments)    Pt states "sugar went to 580"   Januvia [Sitagliptin] Nausea And Vomiting   Jardiance [Empagliflozin] Rash    Tolerated Farxiga samples   Lisinopril Cough    Patient Measurements: Height: 5\' 7"  (170.2 cm) Weight: 102.9 kg (226 lb 13.7 oz) IBW/kg (Calculated) : 66.1 Heparin Dosing Weight: 89 kg  Vital Signs: Temp: 98 F (36.7 C) (03/12 0835) Temp Source: Oral (03/12 0835) BP: 121/96 (03/12 0835) Pulse Rate: 125 (03/12 0835)  Labs: Recent Labs    08/13/23 1151 08/13/23 1151 08/13/23 1344 08/13/23 1830 08/13/23 2000 08/14/23 0511 08/14/23 1233 08/14/23 2204 08/15/23 0755  HGB 11.8*  --   --   --   --  12.9*  --   --  12.8*  HCT 38.0*  --   --   --   --  41.6  --   --  39.4  PLT 164  --   --   --   --  192  --   --  185  APTT  --    < >  --   --   --  41* 46* 63* 62*  HEPARINUNFRC  --   --   --    < > >1.10* >1.10*  --   --  >1.10*  CREATININE 1.01  --   --   --   --   --   --   --  1.54*  TROPONINIHS 2,774*  --  2,562*  --   --   --   --   --   --    < > = values in this interval not displayed.    Estimated Creatinine Clearance: 46.6 mL/min (A) (by C-G formula based on SCr of 1.54 mg/dL (H)).   Assessment: Matthew May is a 78 y.o. year old male admitted on 08/13/2023 with CP and concern for unstable angina. Eliquis prior to admission for afib (last dose 3/10 @ 0530). Pharmacy consulted to dose heparin.  aPTT 62 sec (slightly subtherapeutic) on infusion at 1600 units/hr. No issues with line or bleeding reported per RN.  Goal of Therapy:  Heparin level 0.3-0.7 units/ml aPTT 66-102 secs Monitor platelets by anticoagulation protocol: Yes   Plan:  Increase heparin infusion to 1800 units/h aPTT in 8 hours Daily heparin level, aPTT and CBC.  Reece Leader, Colon Flattery,  [facility] Clinical Pharmacist  08/15/2023 9:58 AM   [facility] pharmacy phone numbers are listed on [email or web address]

## 2023-08-16 ENCOUNTER — Encounter (HOSPITAL_COMMUNITY): Admission: EM | Disposition: E | Payer: Self-pay | Source: Home / Self Care | Attending: Internal Medicine

## 2023-08-16 ENCOUNTER — Telehealth: Payer: Self-pay | Admitting: Family

## 2023-08-16 DIAGNOSIS — I5021 Acute systolic (congestive) heart failure: Secondary | ICD-10-CM | POA: Diagnosis not present

## 2023-08-16 DIAGNOSIS — I5023 Acute on chronic systolic (congestive) heart failure: Secondary | ICD-10-CM | POA: Diagnosis not present

## 2023-08-16 DIAGNOSIS — Z0279 Encounter for issue of other medical certificate: Secondary | ICD-10-CM

## 2023-08-16 HISTORY — PX: RIGHT HEART CATH AND CORONARY/GRAFT ANGIOGRAPHY: CATH118265

## 2023-08-16 LAB — POCT I-STAT EG7
Acid-Base Excess: 7 mmol/L — ABNORMAL HIGH (ref 0.0–2.0)
Acid-Base Excess: 8 mmol/L — ABNORMAL HIGH (ref 0.0–2.0)
Bicarbonate: 32.7 mmol/L — ABNORMAL HIGH (ref 20.0–28.0)
Bicarbonate: 33.8 mmol/L — ABNORMAL HIGH (ref 20.0–28.0)
Calcium, Ion: 1.02 mmol/L — ABNORMAL LOW (ref 1.15–1.40)
Calcium, Ion: 1.04 mmol/L — ABNORMAL LOW (ref 1.15–1.40)
HCT: 40 % (ref 39.0–52.0)
HCT: 41 % (ref 39.0–52.0)
Hemoglobin: 13.6 g/dL (ref 13.0–17.0)
Hemoglobin: 13.9 g/dL (ref 13.0–17.0)
O2 Saturation: 53 %
O2 Saturation: 55 %
Potassium: 3.4 mmol/L — ABNORMAL LOW (ref 3.5–5.1)
Potassium: 3.5 mmol/L (ref 3.5–5.1)
Sodium: 138 mmol/L (ref 135–145)
Sodium: 138 mmol/L (ref 135–145)
TCO2: 34 mmol/L — ABNORMAL HIGH (ref 22–32)
TCO2: 35 mmol/L — ABNORMAL HIGH (ref 22–32)
pCO2, Ven: 49.8 mmHg (ref 44–60)
pCO2, Ven: 51.4 mmHg (ref 44–60)
pH, Ven: 7.425 (ref 7.25–7.43)
pH, Ven: 7.426 (ref 7.25–7.43)
pO2, Ven: 28 mmHg — CL (ref 32–45)
pO2, Ven: 29 mmHg — CL (ref 32–45)

## 2023-08-16 LAB — BASIC METABOLIC PANEL
Anion gap: 10 (ref 5–15)
Anion gap: 17 — ABNORMAL HIGH (ref 5–15)
BUN: 27 mg/dL — ABNORMAL HIGH (ref 8–23)
BUN: 26 mg/dL — ABNORMAL HIGH (ref 8–23)
CO2: 33 mmol/L — ABNORMAL HIGH (ref 22–32)
CO2: 27 mmol/L (ref 22–32)
Calcium: 8.3 mg/dL — ABNORMAL LOW (ref 8.9–10.3)
Calcium: 8.3 mg/dL — ABNORMAL LOW (ref 8.9–10.3)
Chloride: 92 mmol/L — ABNORMAL LOW (ref 98–111)
Chloride: 91 mmol/L — ABNORMAL LOW (ref 98–111)
Creatinine, Ser: 1.37 mg/dL — ABNORMAL HIGH (ref 0.61–1.24)
Creatinine, Ser: 1.44 mg/dL — ABNORMAL HIGH (ref 0.61–1.24)
GFR, Estimated: 53 mL/min — ABNORMAL LOW (ref >60)
GFR, Estimated: 50 mL/min — ABNORMAL LOW (ref >60)
Glucose, Bld: 193 mg/dL — ABNORMAL HIGH (ref 70–99)
Glucose, Bld: 126 mg/dL — ABNORMAL HIGH (ref 70–99)
Potassium: 3.3 mmol/L — ABNORMAL LOW (ref 3.5–5.1)
Potassium: 4 mmol/L (ref 3.5–5.1)
Sodium: 135 mmol/L (ref 135–145)
Sodium: 135 mmol/L (ref 135–145)

## 2023-08-16 LAB — CBC
HCT: 40.9 % (ref 39.0–52.0)
Hemoglobin: 12.8 g/dL — ABNORMAL LOW (ref 13.0–17.0)
MCH: 26.8 pg (ref 26.0–34.0)
MCHC: 31.3 g/dL (ref 30.0–36.0)
MCV: 85.7 fL (ref 80.0–100.0)
Platelets: 185 10*3/uL (ref 150–400)
RBC: 4.77 MIL/uL (ref 4.22–5.81)
RDW: 13.9 % (ref 11.5–15.5)
WBC: 10.6 10*3/uL — ABNORMAL HIGH (ref 4.0–10.5)
nRBC: 0 % (ref 0.0–0.2)

## 2023-08-16 LAB — COOXEMETRY PANEL
Carboxyhemoglobin: 1.5 % (ref 0.5–1.5)
Methemoglobin: <0.7 % (ref 0.0–1.5)
O2 Saturation: 60.3 %
Total hemoglobin: 13.4 g/dL (ref 12.0–16.0)

## 2023-08-16 LAB — MAGNESIUM: Magnesium: 2.1 mg/dL (ref 1.7–2.4)

## 2023-08-16 LAB — APTT: aPTT: 107 s — ABNORMAL HIGH (ref 24–36)

## 2023-08-16 LAB — HEPARIN LEVEL (UNFRACTIONATED): Heparin Unfractionated: >1.1 [IU]/mL — ABNORMAL HIGH (ref 0.30–0.70)

## 2023-08-16 SURGERY — RIGHT HEART CATH AND CORONARY/GRAFT ANGIOGRAPHY
Anesthesia: LOCAL

## 2023-08-16 MED ORDER — MIDAZOLAM HCL 2 MG/2ML IJ SOLN
INTRAMUSCULAR | Status: AC
Start: 2023-08-16 — End: ?
  Filled 2023-08-16: qty 2

## 2023-08-16 MED ORDER — VERAPAMIL HCL 2.5 MG/ML IV SOLN
INTRAVENOUS | Status: AC
  Filled 2023-08-16: qty 2

## 2023-08-16 MED ORDER — SODIUM CHLORIDE 0.9 % IV SOLN: 250.0000 mL | INTRAVENOUS | Status: AC | PRN

## 2023-08-16 MED ORDER — HEPARIN SODIUM (PORCINE) 1000 UNIT/ML IJ SOLN
INTRAMUSCULAR | Status: DC | PRN
  Administered 2023-08-16: 5000 [IU] via INTRAVENOUS

## 2023-08-16 MED ORDER — CLOPIDOGREL BISULFATE 75 MG PO TABS
75.0000 mg | ORAL_TABLET | Freq: Every day | ORAL | Status: DC
Start: 2023-08-16 — End: 2023-08-22
  Administered 2023-08-16 – 2023-08-20 (×4): 75 mg via ORAL
  Filled 2023-08-16 (×5): qty 1

## 2023-08-16 MED ORDER — LIDOCAINE HCL (PF) 1 % IJ SOLN
INTRAMUSCULAR | Status: AC
  Filled 2023-08-16: qty 30

## 2023-08-16 MED ORDER — SODIUM CHLORIDE 0.9 % IV SOLN: INTRAVENOUS | Status: DC

## 2023-08-16 MED ORDER — SODIUM CHLORIDE 0.9% FLUSH: 3.0000 mL | INTRAVENOUS | Status: DC | PRN

## 2023-08-16 MED ORDER — HEPARIN (PORCINE) 25000 UT/250ML-% IV SOLN
1650.0000 [IU]/h | INTRAVENOUS | Status: DC
  Administered 2023-08-16: 1600 [IU]/h via INTRAVENOUS
  Administered 2023-08-17: 1400 [IU]/h via INTRAVENOUS
  Administered 2023-08-18: 1650 [IU]/h via INTRAVENOUS
  Filled 2023-08-16 (×3): qty 250

## 2023-08-16 MED ORDER — FENTANYL CITRATE (PF) 100 MCG/2ML IJ SOLN
INTRAMUSCULAR | Status: DC | PRN
  Administered 2023-08-16: 25 ug via INTRAVENOUS

## 2023-08-16 MED ORDER — LABETALOL HCL 5 MG/ML IV SOLN: 10.0000 mg | INTRAVENOUS | Status: AC | PRN

## 2023-08-16 MED ORDER — SODIUM CHLORIDE 0.9% FLUSH
3.0000 mL | Freq: Two times a day (BID) | INTRAVENOUS | Status: DC
  Administered 2023-08-16 – 2023-08-22 (×7): 3 mL via INTRAVENOUS

## 2023-08-16 MED ORDER — HEPARIN (PORCINE) IN NACL 1000-0.9 UT/500ML-% IV SOLN
INTRAVENOUS | Status: DC | PRN
Start: 2023-08-16 — End: 2023-08-16
  Administered 2023-08-16 (×2): 500 mL via INTRA_ARTERIAL

## 2023-08-16 MED ORDER — QUETIAPINE FUMARATE 25 MG PO TABS
25.0000 mg | ORAL_TABLET | Freq: Every day | ORAL | Status: DC
  Administered 2023-08-16 – 2023-08-20 (×5): 25 mg via ORAL
  Filled 2023-08-16 (×6): qty 1

## 2023-08-16 MED ORDER — ONDANSETRON HCL 4 MG/2ML IJ SOLN: 4.0000 mg | Freq: Four times a day (QID) | INTRAMUSCULAR | Status: DC | PRN

## 2023-08-16 MED ORDER — POTASSIUM CHLORIDE CRYS ER 20 MEQ PO TBCR
60.0000 meq | EXTENDED_RELEASE_TABLET | ORAL | Status: AC
  Administered 2023-08-16 (×2): 60 meq via ORAL
  Filled 2023-08-16 (×2): qty 3

## 2023-08-16 MED ORDER — HYDRALAZINE HCL 20 MG/ML IJ SOLN: 10.0000 mg | INTRAMUSCULAR | Status: AC | PRN

## 2023-08-16 MED ORDER — HEPARIN SODIUM (PORCINE) 1000 UNIT/ML IJ SOLN
INTRAMUSCULAR | Status: AC
  Filled 2023-08-16: qty 10

## 2023-08-16 MED ORDER — DIGOXIN 125 MCG PO TABS
0.1250 mg | ORAL_TABLET | Freq: Every day | ORAL | Status: DC
  Administered 2023-08-16 – 2023-08-20 (×4): 0.125 mg via ORAL
  Filled 2023-08-16 (×5): qty 1

## 2023-08-16 MED ORDER — VERAPAMIL HCL 2.5 MG/ML IV SOLN
INTRAVENOUS | Status: DC | PRN
  Administered 2023-08-16: 10 mL via INTRA_ARTERIAL

## 2023-08-16 MED ORDER — LIDOCAINE HCL (PF) 1 % IJ SOLN
INTRAMUSCULAR | Status: DC | PRN
  Administered 2023-08-16: 5 mL
  Administered 2023-08-16: 2 mL

## 2023-08-16 MED ORDER — IOHEXOL 350 MG/ML SOLN
INTRAVENOUS | Status: DC | PRN
  Administered 2023-08-16: 130 mL via INTRA_ARTERIAL

## 2023-08-16 MED ORDER — MIDAZOLAM HCL 2 MG/2ML IJ SOLN
INTRAMUSCULAR | Status: DC | PRN
  Administered 2023-08-16: .5 mg via INTRAVENOUS

## 2023-08-16 MED ORDER — FENTANYL CITRATE (PF) 100 MCG/2ML IJ SOLN
INTRAMUSCULAR | Status: AC
  Filled 2023-08-16: qty 2

## 2023-08-16 MED ORDER — FUROSEMIDE 10 MG/ML IJ SOLN
5.0000 mg/h | INTRAVENOUS | Status: DC
  Administered 2023-08-16: 5 mg/h via INTRAVENOUS
  Filled 2023-08-16: qty 20

## 2023-08-16 MED ORDER — ACETAMINOPHEN 325 MG PO TABS
650.0000 mg | ORAL_TABLET | ORAL | Status: DC | PRN
  Administered 2023-08-16: 650 mg via ORAL

## 2023-08-16 MED ORDER — AMIODARONE HCL IN DEXTROSE 360-4.14 MG/200ML-% IV SOLN
INTRAVENOUS | Status: AC
  Filled 2023-08-16: qty 200

## 2023-08-16 MED ORDER — SPIRONOLACTONE 12.5 MG HALF TABLET
12.5000 mg | ORAL_TABLET | Freq: Every day | ORAL | Status: DC
  Administered 2023-08-16 – 2023-08-20 (×4): 12.5 mg via ORAL
  Filled 2023-08-16 (×4): qty 1

## 2023-08-16 SURGICAL SUPPLY — 11 items
CATH BALLN WEDGE 5F 110CM (CATHETERS) IMPLANT
CATH INFINITI 5 FR IM (CATHETERS) IMPLANT
CATH INFINITI 5FR MULTPACK ANG (CATHETERS) IMPLANT
DEVICE RAD COMP TR BAND LRG (VASCULAR PRODUCTS) IMPLANT
GLIDESHEATH SLEND SS 6F .021 (SHEATH) IMPLANT
GUIDEWIRE INQWIRE 1.5J.035X260 (WIRE) IMPLANT
INQWIRE 1.5J .035X260CM (WIRE) ×1 IMPLANT
KIT HEART LEFT (KITS) IMPLANT
PACK CARDIAC CATHETERIZATION (CUSTOM PROCEDURE TRAY) ×1 IMPLANT
SHEATH GLIDE SLENDER 4/5FR (SHEATH) IMPLANT
TUBING CIL FLEX 10 FLL-RA (TUBING) IMPLANT

## 2023-08-16 NOTE — Telephone Encounter (Signed)
 Matthew May dropped off FMLA forms to be completed and signed.  Form Fee Paid? (Y/N)       YES     If NO, form is placed on front office manager desk to hold until payment received. If YES, then form will be placed in the RX/HH Nurse Coordinators box for completion.  Form will not be processed until payment is received

## 2023-08-16 NOTE — Plan of Care (Signed)
  Problem: Clinical Measurements: Goal: Diagnostic test results will improve Outcome: Progressing Goal: Respiratory complications will improve Outcome: Progressing Goal: Cardiovascular complication will be avoided Outcome: Progressing   

## 2023-08-16 NOTE — Progress Notes (Addendum)
 PHARMACY - ANTICOAGULATION CONSULT NOTE  Pharmacy Consult for heparin IV Infusion Indication: chest pain/ACS  Allergies  Allergen Reactions   Prednisone Other (See Comments)    Pt states "sugar went to 580"   Januvia [Sitagliptin] Nausea And Vomiting   Jardiance [Empagliflozin] Rash    Tolerated Farxiga samples   Lisinopril Cough    Patient Measurements: Height: 5\' 7"  (170.2 cm) Weight: 99.5 kg (219 lb 5.7 oz) IBW/kg (Calculated) : 66.1 Heparin Dosing Weight: 89 kg  Vital Signs: Temp: 97.6 F (36.4 C) (03/13 0829) Temp Source: Oral (03/13 0829) BP: 138/52 (03/13 1016) Pulse Rate: 126 (03/13 1016)  Labs: Recent Labs    08/13/23 1151 08/13/23 1344 08/13/23 1830 08/14/23 0511 08/14/23 1233 08/15/23 0755 08/15/23 2054 08/16/23 0457  HGB 11.8*  --   --  12.9*  --  12.8*  --  12.8*  HCT 38.0*  --   --  41.6  --  39.4  --  40.9  PLT 164  --   --  192  --  185  --  185  APTT  --   --   --  41*   < > 62* 112* 107*  HEPARINUNFRC  --   --    < > >1.10*  --  >1.10*  --  >1.10*  CREATININE 1.01  --   --   --   --  1.54*  --  1.37*  TROPONINIHS 2,774* 2,562*  --   --   --   --   --   --    < > = values in this interval not displayed.    Estimated Creatinine Clearance: 51.6 mL/min (A) (by C-G formula based on SCr of 1.37 mg/dL (H)).   Assessment: Matthew May is a 78 y.o. year old male admitted on 08/13/2023 with CP and concern for unstable angina. Eliquis prior to admission for afib (last dose 3/10 @ 0530). Pharmacy consulted to dose heparin.  aPTT 107 sec (slightly supra-therapeutic) this AM on 1700 units/hr. Not able to confirm lab was appropriately drawn with RN. Heparin was then paused for RHC/LHC.  While in cath lab, patient received 5000 unit heparin bolus. 90% LIMA-LAD stenosis at touchdown on LAD is not favorable for PCI so will be medically managing.  Pharmacy to restart heparin 2 hours post radial band removal. As patient was slightly supra-therapeutic on  1700 units/hr, will resume at reduced rate. CBC remains stable. Planning TEE-guided DCCV tomorrow.  Goal of Therapy:  Heparin level 0.3-0.7 units/ml aPTT 66-102 secs Monitor platelets by anticoagulation protocol: Yes   Plan:  Restart heparin infusion at 1600 units/h @ 1615 F/u 8hr aPTT Daily heparin level, aPTT and CBC Can discontinue aPTTs once heparin level correlates  Matthew May, PharmD PGY1 Pharmacy Resident 08/16/2023 10:54 AM

## 2023-08-16 NOTE — Interval H&P Note (Signed)
 History and Physical Interval Note:  08/16/2023 9:18 AM  Matthew May  has presented today for surgery, with the diagnosis of cad.  The various methods of treatment have been discussed with the patient and family. After consideration of risks, benefits and other options for treatment, the patient has consented to  Procedure(s): RIGHT/LEFT HEART CATH AND CORONARY/GRAFT ANGIOGRAPHY (N/A) as a surgical intervention.  The patient's history has been reviewed, patient examined, no change in status, stable for surgery.  I have reviewed the patient's chart and labs.  Questions were answered to the patient's satisfaction.     Wilkes Potvin [employer]

## 2023-08-16 NOTE — Progress Notes (Addendum)
 Advanced Heart Failure Rounding Note  Cardiologist: Rollene Rotunda, MD  Chief Complaint: Biventricular Heart Failure Subjective:    Coox pending. CVP 10-11. 5.8L UOP on Lasix 10/hr. Weight down 7lbs. sCr improving 1.54>1.37  SOB improved. No CP. BLE much less tight, he can wiggle his toes now.   Objective:    Weight Range: 102.9 kg Body mass index is 35.53 kg/m.   Vital Signs:   Temp:  [97.6 F (36.4 C)-98 F (36.7 C)] 98 F (36.7 C) (03/12 2345) Pulse Rate:  [84-127] 89 (03/13 0600) Resp:  [18-41] 23 (03/13 0600) BP: (84-133)/(44-111) 107/80 (03/13 0600) SpO2:  [91 %-99 %] 96 % (03/13 0600) Last BM Date : 07/16/23  Weight change: Filed Weights   08/13/23 1142 08/15/23 0500  Weight: 103.4 kg 102.9 kg   Intake/Output:  Intake/Output Summary (Last 24 hours) at 08/16/2023 0652 Last data filed at 08/16/2023 0600 Gross per 24 hour  Intake 1231.21 ml  Output 5850 ml  Net -4618.79 ml    Physical Exam    CVP 10-11 General: Frail appearing  Cardiac: JVP ~10cm. S1 and S2 present. No murmurs or rub. Extremities: Warm and dry. BLE red, bark-like. +1 edema to thighs.  Neuro: Alert and oriented x3. Affect pleasant. Weak Lines/Devices:  RUE PICC  Telemetry   In AF in 110-120s, appear that he attempted to convert to NSR this morning on tele (personally reviewed)  EKG   AF in 120s and progressive ST elevation in V2-5 (personally reviewed)  Labs    CBC Recent Labs    08/15/23 0755 08/16/23 0457  WBC 11.8* 10.6*  HGB 12.8* 12.8*  HCT 39.4 40.9  MCV 85.5 85.7  PLT 185 185   Basic Metabolic Panel Recent Labs    96/29/52 0755 08/16/23 0457  NA 132* 135  K 3.7 3.3*  CL 95* 92*  CO2 26 33*  GLUCOSE 96 193*  BUN 27* 27*  CREATININE 1.54* 1.37*  CALCIUM 8.3* 8.3*  MG 2.0 2.1   Liver Function Tests No results for input(s): "AST", "ALT", "ALKPHOS", "BILITOT", "PROT", "ALBUMIN" in the last 72 hours. No results for input(s): "LIPASE", "AMYLASE" in the  last 72 hours. Cardiac Enzymes No results for input(s): "CKTOTAL", "CKMB", "CKMBINDEX", "TROPONINI" in the last 72 hours.  BNP: BNP (last 3 results) Recent Labs    08/13/23 1151 08/14/23 0511  BNP 324.7* 377.8*   Fasting Lipid Panel Recent Labs    08/13/23 1151  CHOL 74  HDL 24*  LDLCALC 34  TRIG 79  CHOLHDL 3.1   Imaging   DG CHEST PORT 1 VIEW Result Date: 08/15/2023 CLINICAL DATA:  [id number] Dyspnea and respiratory abnormalities [id number] EXAM: PORTABLE CHEST 1 VIEW COMPARISON:  08/13/2023 FINDINGS: Interval placement of right-sided PICC line with distal tip terminating near the superior cavoatrial junction. Stable heart size status post sternotomy, CABG, and TAVR. Pulmonary vascular congestion. Diffuse interstitial prominence. Small bilateral pleural effusions. No pneumothorax. IMPRESSION: Findings of CHF with pulmonary edema and small bilateral pleural effusions. Electronically Signed   By: Duanne Guess D.O.   On: 08/15/2023 18:26   [place] EKG SITE RITE Result Date: 08/15/2023 If Site Rite image not attached, placement could not be confirmed due to current cardiac rhythm.  Medications:    Scheduled Medications:  aspirin EC  81 mg Oral Daily   Chlorhexidine Gluconate Cloth  6 each Topical Daily   dapagliflozin propanediol  10 mg Oral Daily   feeding supplement  237 mL Oral BID BM  fluticasone furoate-vilanterol  1 puff Inhalation Daily   And   umeclidinium bromide  1 puff Inhalation Daily   gabapentin  100 mg Oral TID   glimepiride  4 mg Oral Q breakfast   levothyroxine  150 mcg Oral Q0600   loratadine  10 mg Oral Daily   mupirocin ointment   Nasal BID   potassium chloride SA  20 mEq Oral Daily   rosuvastatin  40 mg Oral Daily   sodium chloride flush  10-40 mL Intracatheter Q12H   Vitamin D (Ergocalciferol)  50,000 Units Oral Weekly    Infusions:  amiodarone 30 mg/hr (08/16/23 0600)   furosemide (LASIX) 200 mg in dextrose 5 % 100 mL (2 mg/mL) infusion 10 mg/hr  (08/16/23 0600)   heparin 1,700 Units/hr (08/16/23 0600)    PRN Medications: acetaminophen, acetaminophen, albuterol, nitroGLYCERIN, ondansetron (ZOFRAN) IV, sodium chloride flush  Patient Profile   Mr. Binette is a 78 year old white male with CAD status post CABG in 2011, severe aortic stenosis status post TAVR in 2021, COPD, hyperlipidemia, type 2 diabetes and paroxysmal atrial fibrillation/flutter currently admitted with concerns for cardiogenic shock.   Assessment/Plan   Acute Biventricular HFrEF CAD  - suspected completed infarct. Suspect mixed etiology with CAD and tachy mediated.  - Echo with EF down to 20-25% from previous 70-75% - Co-ox yesterday normal. Pending today. CXR w pulm edema - CVP 10. Great response to Lasix 10/hr, decrease to 5/hr. Replete K. - BP too low for ARB - BB on hold - on Farxiga 10 mg daily - start spiro 12.5 mg daily - R/LHC today  Informed Consent   Shared Decision Making/Informed Consent The risks [stroke (1 in 1000), death (1 in 1000), kidney failure [usually temporary] (1 in 500), bleeding (1 in 200), allergic reaction [possibly serious] (1 in 200)], benefits (diagnostic support and management of coronary artery disease) and alternatives of a cardiac catheterization were discussed in detail with Mr. Kann and he is willing to proceed.    CAD - H/O CABG 2011 LIMA to LAD, SVG to OM and sequential SVG to PDA and PL branch - Dr Nadara Eaton reviewed. Chest started a few weeks ago. Suspected completed infart. HS Trop [id number] - EKG with progressive ST elevation in V2-5 - no CP - On Aspirin + crestor.  - Holding bb due to possible shock. - cath once euvolemic and renal function stable  Acute Respiratory Distress - RR improved with diuresis. On [place] - CXR w pulm edema    A flutter  - EKG at PCP in [place] looked like A fib RVR. He declined further work up per his wife.  - A Flutter RVR on admit. Attempted to convert overnight on tele. - TSH ok.  -  Continue amio drip + heparin drip.  - Will need eventual TEE/Cardioversion, likely tomorrow   AKI - Creatinine on admit 1>1.5>1.3 - cardiorenal; improving with decongestion  H/o TAVR - Normal valve function on echo this admission.    DNR  Length of Stay: 3  CRITICAL CARE Performed by: [place] Lee  Total critical care time: 15 minutes  Critical care time was exclusive of separately billable procedures and treating other patients.  Critical care was necessary to treat or prevent imminent or life-threatening deterioration.  Critical care was time spent personally by me on the following activities: development of treatment plan with patient and/or surrogate as well as nursing, discussions with consultants, evaluation of patient's response to treatment, examination of patient, obtaining history from patient or surrogate,  ordering and performing treatments and interventions, ordering and review of laboratory studies, ordering and review of radiographic studies, pulse oximetry and re-evaluation of patient's condition.  [place] Lee, NP  08/16/2023, 6:52 AM  Advanced Heart Failure Team Pager [phone] (M-F; 7a - 5p)  Please contact [facility] for night-coverage after hours (5p -7a ) and weekends on [email or web address]  Patient seen with NP, I formulated the plan and agree with the above note.   He remains in atrial fibrillation rate 90s.  He is on Lasix gtt 10 mg/hr with excellent diuresis, I/O net negative 4580 cc and weight down.  Co-ox 60% this morning, not on inotrope. Creatinine lower at 1.37.  Breathing improved, no chest pain.   RHC/LHC today: 1. Low cardiac index at 1.97.  2. Filling pressures upper normal (PCWP 15, RA pressure 8).  3. Mild pulmonary venous hypertension.  4. PAPi preserved.  5. He had a patent sequential SVG-PLV/PDA.  RCA known to be occluded and not injected.  SVG-OM known to be occluded and not injected.  The proximal LCx is occluded after a high OM1.  LIMA-LAD has a  90% stenosis with some haziness at the touchdown on the distal LAD.  This is likely the culprit for NSTEMI.    General: NAD Neck: JVP 8 cm, no thyromegaly or thyroid nodule.  Lungs: Clear to auscultation bilaterally with normal respiratory effort. CV: Nondisplaced PMI.  Heart irregular S1/S2, no S3/S4, 1/6 SEM RUSB. 1+ edema at ankles.   Abdomen: Soft, nontender, no hepatosplenomegaly, no distention.  Skin: Intact without lesions or rashes.  Neurologic: Alert and oriented x 3.  Psych: Normal affect. Extremities: No clubbing or cyanosis.  HEENT: Normal.   RHC showed filling pressures near-optimized.  Will hold Lasix gtt for now and restart at 5 mg/hr later this afternoon.   CI 1.97, will hold off on milrinone for now.  Will start digoxin 0.125 and continue as long as renal function continues to trend down.   Restart heparin gtt this afternoon.  Continue amiodarone gtt.  Will need TEE-guided DCCV tomorrow.   He is no longer having chest pain. Suspect completed infarct.  The 90% LIMA-LAD stenosis at touchdown on LAD is the likely culprit.  I reviewed films with Dr. Clifton James. The LAD is small in caliber at the LIMA touchdown site and not favorable for PCI so we plan to medically manage.  He will be anticoagulated. I will stop ASA and start po Plavix given ACS.   CRITICAL CARE Performed by: Marca Ancona  Total critical care time: 40 minutes  Critical care time was exclusive of separately billable procedures and treating other patients.  Critical care was necessary to treat or prevent imminent or life-threatening deterioration.  Critical care was time spent personally by me on the following activities: development of treatment plan with patient and/or surrogate as well as nursing, discussions with consultants, evaluation of patient's response to treatment, examination of patient, obtaining history from patient or surrogate, ordering and performing treatments and interventions, ordering and  review of laboratory studies, ordering and review of radiographic studies, pulse oximetry and re-evaluation of patient's condition.  Marca Ancona 08/16/2023 10:45 AM

## 2023-08-16 NOTE — H&P (View-Only) (Signed)
 Advanced Heart Failure Rounding Note  Cardiologist: Rollene Rotunda, MD  Chief Complaint: Biventricular Heart Failure Subjective:    Coox pending. CVP 10-11. 5.8L UOP on Lasix 10/hr. Weight down 7lbs. sCr improving 1.54>1.37  SOB improved. No CP. BLE much less tight, he can wiggle his toes now.   Objective:    Weight Range: 102.9 kg Body mass index is 35.53 kg/m.   Vital Signs:   Temp:  [97.6 F (36.4 C)-98 F (36.7 C)] 98 F (36.7 C) (03/12 2345) Pulse Rate:  [84-127] 89 (03/13 0600) Resp:  [18-41] 23 (03/13 0600) BP: (84-133)/(44-111) 107/80 (03/13 0600) SpO2:  [91 %-99 %] 96 % (03/13 0600) Last BM Date : 07/16/23  Weight change: Filed Weights   08/13/23 1142 08/15/23 0500  Weight: 103.4 kg 102.9 kg   Intake/Output:  Intake/Output Summary (Last 24 hours) at 08/16/2023 0652 Last data filed at 08/16/2023 0600 Gross per 24 hour  Intake 1231.21 ml  Output 5850 ml  Net -4618.79 ml    Physical Exam    CVP 10-11 General: Frail appearing  Cardiac: JVP ~10cm. S1 and S2 present. No murmurs or rub. Extremities: Warm and dry. BLE red, bark-like. +1 edema to thighs.  Neuro: Alert and oriented x3. Affect pleasant. Weak Lines/Devices:  RUE PICC  Telemetry   In AF in 110-120s, appear that he attempted to convert to NSR this morning on tele (personally reviewed)  EKG   AF in 120s and progressive ST elevation in V2-5 (personally reviewed)  Labs    CBC Recent Labs    08/15/23 0755 08/16/23 0457  WBC 11.8* 10.6*  HGB 12.8* 12.8*  HCT 39.4 40.9  MCV 85.5 85.7  PLT 185 185   Basic Metabolic Panel Recent Labs    96/29/52 0755 08/16/23 0457  NA 132* 135  K 3.7 3.3*  CL 95* 92*  CO2 26 33*  GLUCOSE 96 193*  BUN 27* 27*  CREATININE 1.54* 1.37*  CALCIUM 8.3* 8.3*  MG 2.0 2.1   Liver Function Tests No results for input(s): "AST", "ALT", "ALKPHOS", "BILITOT", "PROT", "ALBUMIN" in the last 72 hours. No results for input(s): "LIPASE", "AMYLASE" in the  last 72 hours. Cardiac Enzymes No results for input(s): "CKTOTAL", "CKMB", "CKMBINDEX", "TROPONINI" in the last 72 hours.  BNP: BNP (last 3 results) Recent Labs    08/13/23 1151 08/14/23 0511  BNP 324.7* 377.8*   Fasting Lipid Panel Recent Labs    08/13/23 1151  CHOL 74  HDL 24*  LDLCALC 34  TRIG 79  CHOLHDL 3.1   Imaging   DG CHEST PORT 1 VIEW Result Date: 08/15/2023 CLINICAL DATA:  [id number] Dyspnea and respiratory abnormalities [id number] EXAM: PORTABLE CHEST 1 VIEW COMPARISON:  08/13/2023 FINDINGS: Interval placement of right-sided PICC line with distal tip terminating near the superior cavoatrial junction. Stable heart size status post sternotomy, CABG, and TAVR. Pulmonary vascular congestion. Diffuse interstitial prominence. Small bilateral pleural effusions. No pneumothorax. IMPRESSION: Findings of CHF with pulmonary edema and small bilateral pleural effusions. Electronically Signed   By: Duanne Guess D.O.   On: 08/15/2023 18:26   [place] EKG SITE RITE Result Date: 08/15/2023 If Site Rite image not attached, placement could not be confirmed due to current cardiac rhythm.  Medications:    Scheduled Medications:  aspirin EC  81 mg Oral Daily   Chlorhexidine Gluconate Cloth  6 each Topical Daily   dapagliflozin propanediol  10 mg Oral Daily   feeding supplement  237 mL Oral BID BM  fluticasone furoate-vilanterol  1 puff Inhalation Daily   And   umeclidinium bromide  1 puff Inhalation Daily   gabapentin  100 mg Oral TID   glimepiride  4 mg Oral Q breakfast   levothyroxine  150 mcg Oral Q0600   loratadine  10 mg Oral Daily   mupirocin ointment   Nasal BID   potassium chloride SA  20 mEq Oral Daily   rosuvastatin  40 mg Oral Daily   sodium chloride flush  10-40 mL Intracatheter Q12H   Vitamin D (Ergocalciferol)  50,000 Units Oral Weekly    Infusions:  amiodarone 30 mg/hr (08/16/23 0600)   furosemide (LASIX) 200 mg in dextrose 5 % 100 mL (2 mg/mL) infusion 10 mg/hr  (08/16/23 0600)   heparin 1,700 Units/hr (08/16/23 0600)    PRN Medications: acetaminophen, acetaminophen, albuterol, nitroGLYCERIN, ondansetron (ZOFRAN) IV, sodium chloride flush  Patient Profile   Mr. Binette is a 78 year old white male with CAD status post CABG in 2011, severe aortic stenosis status post TAVR in 2021, COPD, hyperlipidemia, type 2 diabetes and paroxysmal atrial fibrillation/flutter currently admitted with concerns for cardiogenic shock.   Assessment/Plan   Acute Biventricular HFrEF CAD  - suspected completed infarct. Suspect mixed etiology with CAD and tachy mediated.  - Echo with EF down to 20-25% from previous 70-75% - Co-ox yesterday normal. Pending today. CXR w pulm edema - CVP 10. Great response to Lasix 10/hr, decrease to 5/hr. Replete K. - BP too low for ARB - BB on hold - on Farxiga 10 mg daily - start spiro 12.5 mg daily - R/LHC today  Informed Consent   Shared Decision Making/Informed Consent The risks [stroke (1 in 1000), death (1 in 1000), kidney failure [usually temporary] (1 in 500), bleeding (1 in 200), allergic reaction [possibly serious] (1 in 200)], benefits (diagnostic support and management of coronary artery disease) and alternatives of a cardiac catheterization were discussed in detail with Mr. Kann and he is willing to proceed.    CAD - H/O CABG 2011 LIMA to LAD, SVG to OM and sequential SVG to PDA and PL branch - Dr Nadara Eaton reviewed. Chest started a few weeks ago. Suspected completed infart. HS Trop 1610>9604 - EKG with progressive ST elevation in V2-5 - no CP - On Aspirin + crestor.  - Holding bb due to possible shock. - cath once euvolemic and renal function stable  Acute Respiratory Distress - RR improved with diuresis. On Magee - CXR w pulm edema    A flutter  - EKG at PCP in [place] looked like A fib RVR. He declined further work up per his wife.  - A Flutter RVR on admit. Attempted to convert overnight on tele. - TSH ok.  -  Continue amio drip + heparin drip.  - Will need eventual TEE/Cardioversion, likely tomorrow   AKI - Creatinine on admit 1>1.5>1.3 - cardiorenal; improving with decongestion  H/o TAVR - Normal valve function on echo this admission.    DNR  Length of Stay: 3  CRITICAL CARE Performed by: [place] Lee  Total critical care time: 15 minutes  Critical care time was exclusive of separately billable procedures and treating other patients.  Critical care was necessary to treat or prevent imminent or life-threatening deterioration.  Critical care was time spent personally by me on the following activities: development of treatment plan with patient and/or surrogate as well as nursing, discussions with consultants, evaluation of patient's response to treatment, examination of patient, obtaining history from patient or surrogate,  ordering and performing treatments and interventions, ordering and review of laboratory studies, ordering and review of radiographic studies, pulse oximetry and re-evaluation of patient's condition.  [place] Lee, NP  08/16/2023, 6:52 AM  Advanced Heart Failure Team Pager [phone] (M-F; 7a - 5p)  Please contact [facility] for night-coverage after hours (5p -7a ) and weekends on [email or web address]  Patient seen with NP, I formulated the plan and agree with the above note.   He remains in atrial fibrillation rate 90s.  He is on Lasix gtt 10 mg/hr with excellent diuresis, I/O net negative 4580 cc and weight down.  Co-ox 60% this morning, not on inotrope. Creatinine lower at 1.37.  Breathing improved, no chest pain.   RHC/LHC today: 1. Low cardiac index at 1.97.  2. Filling pressures upper normal (PCWP 15, RA pressure 8).  3. Mild pulmonary venous hypertension.  4. PAPi preserved.  5. He had a patent sequential SVG-PLV/PDA.  RCA known to be occluded and not injected.  SVG-OM known to be occluded and not injected.  The proximal LCx is occluded after a high OM1.  LIMA-LAD has a  90% stenosis with some haziness at the touchdown on the distal LAD.  This is likely the culprit for NSTEMI.    General: NAD Neck: JVP 8 cm, no thyromegaly or thyroid nodule.  Lungs: Clear to auscultation bilaterally with normal respiratory effort. CV: Nondisplaced PMI.  Heart irregular S1/S2, no S3/S4, 1/6 SEM RUSB. 1+ edema at ankles.   Abdomen: Soft, nontender, no hepatosplenomegaly, no distention.  Skin: Intact without lesions or rashes.  Neurologic: Alert and oriented x 3.  Psych: Normal affect. Extremities: No clubbing or cyanosis.  HEENT: Normal.   RHC showed filling pressures near-optimized.  Will hold Lasix gtt for now and restart at 5 mg/hr later this afternoon.   CI 1.97, will hold off on milrinone for now.  Will start digoxin 0.125 and continue as long as renal function continues to trend down.   Restart heparin gtt this afternoon.  Continue amiodarone gtt.  Will need TEE-guided DCCV tomorrow.   He is no longer having chest pain. Suspect completed infarct.  The 90% LIMA-LAD stenosis at touchdown on LAD is the likely culprit.  I reviewed films with Dr. Clifton James. The LAD is small in caliber at the LIMA touchdown site and not favorable for PCI so we plan to medically manage.  He will be anticoagulated. I will stop ASA and start po Plavix given ACS.   CRITICAL CARE Performed by: Marca Ancona  Total critical care time: 40 minutes  Critical care time was exclusive of separately billable procedures and treating other patients.  Critical care was necessary to treat or prevent imminent or life-threatening deterioration.  Critical care was time spent personally by me on the following activities: development of treatment plan with patient and/or surrogate as well as nursing, discussions with consultants, evaluation of patient's response to treatment, examination of patient, obtaining history from patient or surrogate, ordering and performing treatments and interventions, ordering and  review of laboratory studies, ordering and review of radiographic studies, pulse oximetry and re-evaluation of patient's condition.  Marca Ancona 08/16/2023 10:45 AM

## 2023-08-16 NOTE — Plan of Care (Signed)
  Problem: Cardiac: Goal: Ability to achieve and maintain adequate cardiovascular perfusion will improve Outcome: Progressing   Problem: Education: Goal: Knowledge of General Education information will improve Description: Including pain rating scale, medication(s)/side effects and non-pharmacologic comfort measures Outcome: Not Progressing   Problem: Health Behavior/Discharge Planning: Goal: Ability to manage health-related needs will improve Outcome: Not Progressing   Problem: Clinical Measurements: Goal: Ability to maintain clinical measurements within normal limits will improve Outcome: Progressing Goal: Will remain free from infection Outcome: Progressing Goal: Cardiovascular complication will be avoided Outcome: Progressing

## 2023-08-16 NOTE — TOC Initial Note (Signed)
 Transition of Care [facility]) - Initial/Assessment Note    Patient Details  Name: Matthew May MRN: [id number] Date of Birth: May 13, 1946  Transition of Care [facility]) CM/SW Contact:    Elliot Cousin, RN Phone Number: [phone] 08/16/2023, 2:06 PM  Clinical Narrative:                  TOC CM spoke to pt and wife/dtr at bedside. Pt was independent pta. Uses RW and Rollator at home. He had oxygen previously. Wife is requesting [place] for DME, oxygen. Wife is at home to assist with care.      Expected Discharge Plan: Home w Home Health Services Barriers to Discharge: Continued Medical Work up   Patient Goals and CMS Choice Patient states their goals for this hospitalization and ongoing recovery are:: wants to get better and remain independent CMS Medicare.gov Compare Post Acute Care list provided to:: Patient Represenative (must comment) Choice offered to / list presented to : Spouse      Expected Discharge Plan and Services   Discharge Planning Services: CM Consult Post Acute Care Choice: Home Health Living arrangements for the past 2 months: Single Family Home                   Prior Living Arrangements/Services Living arrangements for the past 2 months: Single Family Home Lives with:: Spouse Patient language and need for interpreter reviewed:: Yes Do you feel safe going back to the place where you live?: Yes      Need for Family Participation in Patient Care: Yes (Comment) Care giver support system in place?: Yes (comment) Current home services: DME (rolling walker, rollator) Criminal Activity/Legal Involvement Pertinent to Current Situation/Hospitalization: No - Comment as needed  Activities of Daily Living   ADL Screening (condition at time of admission) Independently performs ADLs?: No Does the patient have a NEW difficulty with bathing/dressing/toileting/self-feeding that is expected to last >3 days?: No (needs assist) Does the patient have a NEW  difficulty with getting in/out of bed, walking, or climbing stairs that is expected to last >3 days?: No (needs assist up with walker) Does the patient have a NEW difficulty with communication that is expected to last >3 days?: No Is the patient deaf or have difficulty hearing?: Yes Does the patient have difficulty seeing, even when wearing glasses/contacts?: Yes Does the patient have difficulty concentrating, remembering, or making decisions?: No  Permission Sought/Granted Permission sought to share information with : Case Manager, Family Supports, PCP Permission granted to share information with : Yes, Verbal Permission Granted  Share Information with NAME: Aadan Chenier     Permission granted to share info w Relationship: spouse  Permission granted to share info w Contact Information: [phone]  Emotional Assessment Appearance:: Appears stated age Attitude/Demeanor/Rapport: Engaged Affect (typically observed): Accepting Orientation: : Oriented to Self, Oriented to Place, Oriented to  Time, Oriented to Situation   Psych Involvement: No (comment)  Admission diagnosis:  Shortness of breath [R06.02] Unstable angina (HCC) [I20.0] Patient Active Problem List   Diagnosis Date Noted   Unstable angina (HCC) 08/13/2023   PAD (peripheral artery disease) (HCC) 03/19/2023   Acute respiratory failure with hypoxia (HCC) 06/08/2022   Acute on chronic diastolic CHF (congestive heart failure) (HCC) 06/08/2022   S/P TAVR (transcatheter aortic valve replacement) 01/20/2020   HTN (hypertension)    COPD (chronic obstructive pulmonary disease) (HCC) 09/09/2018   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   Paroxysmal atrial fibrillation (HCC) 01/28/2018   Near syncope  Postural dizziness with presyncope 12/30/2017   Bilateral carotid artery stenosis 11/01/2016   Severe aortic stenosis 11/01/2016   Morbid obesity (HCC) 11/29/2015   Trigeminy 05/23/2013   Subclavian artery stenosis, right (HCC)  12/03/2012   Diabetes mellitus, type 2 (HCC) 08/26/2012   Vitamin D deficiency 08/26/2012   Hypothyroidism 07/12/2011   Hyperlipidemia associated with type 2 diabetes mellitus (HCC) 11/10/2009   CORONARY ATHEROSCLEROSIS NATIVE CORONARY ARTERY 11/10/2009   PCP:  [name], FNP Pharmacy:   [facility] [address], [place] - [address] [place] [address] Phone: [phone] Fax: [phone]  [facility] Delivery) - [place], [place] - [address] [facility] [address] [place] [address] [place] [address] Phone: [phone] Fax: [phone]  [facility] Delivery - [place], [place] - [address] [place] [address] Phone: [phone] Fax: [phone]     Social Drivers of Health (SDOH) Social History: SDOH Screenings   Food Insecurity: No Food Insecurity (08/13/2023)  Housing: Low Risk  (08/13/2023)  Transportation Needs: No Transportation Needs (08/13/2023)  Utilities: Not At Risk (08/13/2023)  Alcohol Screen: Low Risk  (08/25/2022)  Depression (PHQ2-9): Low Risk  (10/26/2022)  Financial Resource Strain: Low Risk  (08/25/2022)  Physical Activity: Insufficiently Active (08/25/2022)  Social Connections: Moderately Isolated (08/13/2023)  Stress: No Stress Concern Present (08/25/2022)  Tobacco Use: Medium Risk (08/14/2023)   SDOH Interventions:     Readmission Risk Interventions     No data to display

## 2023-08-16 NOTE — Progress Notes (Signed)
 PT Cancellation Note  Patient Details Name: Matthew May MRN: [id number] DOB: 07-Nov-1945   Cancelled Treatment:    Reason Eval/Treat Not Completed: Patient not medically ready (post cath still with TR band)   [name] 08/16/2023, 12:16 PM [name], PT Acute Rehabilitation Services Office: [phone]

## 2023-08-17 ENCOUNTER — Encounter (HOSPITAL_COMMUNITY): Admission: EM | Disposition: E | Payer: Self-pay | Source: Home / Self Care | Attending: Internal Medicine

## 2023-08-17 ENCOUNTER — Inpatient Hospital Stay (HOSPITAL_COMMUNITY)

## 2023-08-17 ENCOUNTER — Inpatient Hospital Stay (HOSPITAL_COMMUNITY): Admitting: Anesthesiology

## 2023-08-17 ENCOUNTER — Encounter (HOSPITAL_COMMUNITY): Payer: Self-pay | Admitting: Cardiology

## 2023-08-17 DIAGNOSIS — I110 Hypertensive heart disease with heart failure: Secondary | ICD-10-CM

## 2023-08-17 DIAGNOSIS — I5033 Acute on chronic diastolic (congestive) heart failure: Secondary | ICD-10-CM

## 2023-08-17 DIAGNOSIS — I25119 Atherosclerotic heart disease of native coronary artery with unspecified angina pectoris: Secondary | ICD-10-CM

## 2023-08-17 DIAGNOSIS — I4891 Unspecified atrial fibrillation: Secondary | ICD-10-CM

## 2023-08-17 DIAGNOSIS — I4892 Unspecified atrial flutter: Secondary | ICD-10-CM

## 2023-08-17 DIAGNOSIS — I200 Unstable angina: Secondary | ICD-10-CM | POA: Diagnosis not present

## 2023-08-17 HISTORY — PX: TRANSESOPHAGEAL ECHOCARDIOGRAM (CATH LAB): EP1270

## 2023-08-17 HISTORY — PX: CARDIOVERSION: EP1203

## 2023-08-17 LAB — CBC
HCT: 41 % (ref 39.0–52.0)
Hemoglobin: 12.9 g/dL — ABNORMAL LOW (ref 13.0–17.0)
MCH: 27 pg (ref 26.0–34.0)
MCHC: 31.5 g/dL (ref 30.0–36.0)
MCV: 85.8 fL (ref 80.0–100.0)
Platelets: 191 10*3/uL (ref 150–400)
RBC: 4.78 MIL/uL (ref 4.22–5.81)
RDW: 14 % (ref 11.5–15.5)
WBC: 10.6 10*3/uL — ABNORMAL HIGH (ref 4.0–10.5)
nRBC: 0 % (ref 0.0–0.2)

## 2023-08-17 LAB — ECHO TEE
AV Mean grad: 1 mmHg
AV Peak grad: 2.1 mmHg
Ao pk vel: 0.72 m/s

## 2023-08-17 LAB — BASIC METABOLIC PANEL
Anion gap: 11 (ref 5–15)
Anion gap: 13 (ref 5–15)
BUN: 24 mg/dL — ABNORMAL HIGH (ref 8–23)
BUN: 26 mg/dL — ABNORMAL HIGH (ref 8–23)
CO2: 29 mmol/L (ref 22–32)
CO2: 30 mmol/L (ref 22–32)
Calcium: 8 mg/dL — ABNORMAL LOW (ref 8.9–10.3)
Calcium: 8.3 mg/dL — ABNORMAL LOW (ref 8.9–10.3)
Chloride: 94 mmol/L — ABNORMAL LOW (ref 98–111)
Chloride: 94 mmol/L — ABNORMAL LOW (ref 98–111)
Creatinine, Ser: 1.44 mg/dL — ABNORMAL HIGH (ref 0.61–1.24)
Creatinine, Ser: 1.46 mg/dL — ABNORMAL HIGH (ref 0.61–1.24)
GFR, Estimated: 50 mL/min — ABNORMAL LOW (ref >60)
GFR, Estimated: 50 mL/min — ABNORMAL LOW (ref >60)
Glucose, Bld: 180 mg/dL — ABNORMAL HIGH (ref 70–99)
Glucose, Bld: 249 mg/dL — ABNORMAL HIGH (ref 70–99)
Potassium: 3.8 mmol/L (ref 3.5–5.1)
Potassium: 4 mmol/L (ref 3.5–5.1)
Sodium: 135 mmol/L (ref 135–145)
Sodium: 136 mmol/L (ref 135–145)

## 2023-08-17 LAB — HEPARIN LEVEL (UNFRACTIONATED): Heparin Unfractionated: 0.55 [IU]/mL (ref 0.30–0.70)

## 2023-08-17 LAB — APTT
aPTT: 147 s — ABNORMAL HIGH (ref 24–36)
aPTT: 43 s — ABNORMAL HIGH (ref 24–36)
aPTT: 55 s — ABNORMAL HIGH (ref 24–36)

## 2023-08-17 LAB — COOXEMETRY PANEL
Carboxyhemoglobin: 1.6 % — ABNORMAL HIGH (ref 0.5–1.5)
Methemoglobin: <0.7 % (ref 0.0–1.5)
O2 Saturation: 66 %
Total hemoglobin: 13.2 g/dL (ref 12.0–16.0)

## 2023-08-17 LAB — MAGNESIUM
Magnesium: 2.1 mg/dL (ref 1.7–2.4)
Magnesium: 2.2 mg/dL (ref 1.7–2.4)

## 2023-08-17 LAB — GLUCOSE, CAPILLARY
Glucose-Capillary: 155 mg/dL — ABNORMAL HIGH (ref 70–99)
Glucose-Capillary: 201 mg/dL — ABNORMAL HIGH (ref 70–99)

## 2023-08-17 SURGERY — TRANSESOPHAGEAL ECHOCARDIOGRAM (TEE) (CATHLAB)
Anesthesia: Monitor Anesthesia Care

## 2023-08-17 MED ORDER — LOSARTAN POTASSIUM 25 MG PO TABS
25.0000 mg | ORAL_TABLET | Freq: Every day | ORAL | Status: DC
  Administered 2023-08-18 – 2023-08-20 (×3): 25 mg via ORAL
  Filled 2023-08-17 (×4): qty 1

## 2023-08-17 MED ORDER — LIDOCAINE 2% (20 MG/ML) 5 ML SYRINGE
INTRAMUSCULAR | Status: DC | PRN
  Administered 2023-08-17: 100 mg via INTRAVENOUS

## 2023-08-17 MED ORDER — POLYETHYLENE GLYCOL 3350 17 G PO PACK
17.0000 g | PACK | Freq: Two times a day (BID) | ORAL | Status: DC
  Administered 2023-08-17 – 2023-08-20 (×4): 17 g via ORAL
  Filled 2023-08-17 (×8): qty 1

## 2023-08-17 MED ORDER — SENNA 8.6 MG PO TABS
1.0000 | ORAL_TABLET | Freq: Every day | ORAL | Status: DC
  Administered 2023-08-17 – 2023-08-20 (×4): 8.6 mg via ORAL
  Filled 2023-08-17 (×5): qty 1

## 2023-08-17 MED ORDER — BUTAMBEN-TETRACAINE-BENZOCAINE 2-2-14 % EX AERO
INHALATION_SPRAY | CUTANEOUS | Status: DC | PRN
  Administered 2023-08-17: 2 via TOPICAL

## 2023-08-17 MED ORDER — PROPOFOL 10 MG/ML IV BOLUS
INTRAVENOUS | Status: DC | PRN
Start: 2023-08-17 — End: 2023-08-17
  Administered 2023-08-17: 20 mg via INTRAVENOUS

## 2023-08-17 MED ORDER — PROPOFOL 500 MG/50ML IV EMUL
INTRAVENOUS | Status: DC | PRN
  Administered 2023-08-17: 20 ug/kg/min via INTRAVENOUS

## 2023-08-17 MED ORDER — SODIUM CHLORIDE 0.9 % IV SOLN
INTRAVENOUS | Status: DC | PRN
Start: 2023-08-17 — End: 2023-08-17

## 2023-08-17 MED ORDER — BUTAMBEN-TETRACAINE-BENZOCAINE 2-2-14 % EX AERO
INHALATION_SPRAY | CUTANEOUS | Status: AC
Start: 2023-08-17 — End: 2023-08-17
  Filled 2023-08-17: qty 20

## 2023-08-17 MED ORDER — INSULIN ASPART 100 UNIT/ML IJ SOLN
0.0000 [IU] | Freq: Three times a day (TID) | INTRAMUSCULAR | Status: DC
  Administered 2023-08-17 – 2023-08-18 (×4): 2 [IU] via SUBCUTANEOUS
  Administered 2023-08-19: 1 [IU] via SUBCUTANEOUS
  Administered 2023-08-19: 3 [IU] via SUBCUTANEOUS
  Administered 2023-08-19: 2 [IU] via SUBCUTANEOUS
  Administered 2023-08-20 – 2023-08-21 (×2): 1 [IU] via SUBCUTANEOUS

## 2023-08-17 SURGICAL SUPPLY — 1 items: PAD DEFIB RADIO PHYSIO CONN (PAD) ×1 IMPLANT

## 2023-08-17 NOTE — Consult Note (Signed)
 [facility] Liaison Note  08/17/2023  Matthew May May 21, 1946 [id number] Covering Matthew May  Location: [facility] Liaison screened the patient remotely at [facility].  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 The patient was screened for readmission hospitalization with noted high risk score for unplanned readmission risk with 1 IP in 6 months.  The patient was assessed for potential Care Management service needs for post hospital transition for care coordination. Review of patient's electronic medical record reveals patient was admitted for Unstable Angina. Pt recommended for SNF level of care, currently pending. If pt d/c to an affiliated facility will collaborate with [facility] PAC-RN to follow. If pt is d/c to a non-affiliated SNF the facility will continue to address pt's needs.  Plan: [facility] Liaison will continue to follow progress and disposition to asess for post hospital community care coordination/management needs.  Referral request for community care coordination: pending disposition.   [facility] Care Management/Population Health does not replace or interfere with any arrangements made by the Inpatient Transition of Care team.   For questions contact:   Matthew Cousin, RN, BSN Hospital Liaison [place]   [facility], Population Health Office Hours MTWF  8:00 am-6:00 pm Direct Dial: [phone] mobile [email or web address]

## 2023-08-17 NOTE — Progress Notes (Signed)
 OT Cancellation Note  Patient Details Name: SYON TEWS MRN: [id number] DOB: [birth year]   Cancelled Treatment:    Reason Eval/Treat Not Completed: Patient at procedure or test/ unavailable (Attempted to see pt for OT eval. Pt currently off unit for TEE. OT to reattempt to see pt at a later time as available/appropriate.)  Imanol Bihl "Ronaldo Miyamoto" M., OTR/L, MA Acute Rehab [phone]  [name] 08/17/2023, 9:06 AM

## 2023-08-17 NOTE — CV Procedure (Signed)
 Procedure: TEE  Sedation: Per anesthesiology  Indication: Atrial fibrillation with RVR  Findings: Please see echo section for full report.  Normal LV size with mild LV hypertrophy, EF 40-45% with mid-apical septal and apical akinesis.  Mild RV dysfunction. Mild left and right atrial enlargement.  No LA appendage thrombus.  Bioprosthetic aortic valve s/p TAVR with no significant perivalvular leakage, no significant stenosis/opens well.  Trivial TR.  Trivial MR.  Normal caliber thoracic aorta with mild plaque.   Proceed with DCCV.   Marca Ancona 08/17/2023 10:59 AM

## 2023-08-17 NOTE — Procedures (Signed)
 Electrical Cardioversion Procedure Note TORIEN RAMROOP [id number] Jan 14, 1946  Procedure: Electrical Cardioversion Indications:  Atrial Flutter  Procedure Details Consent: Risks of procedure as well as the alternatives and risks of each were explained to the (patient/caregiver).  Consent for procedure obtained. Time Out: Verified patient identification, verified procedure, site/side was marked, verified correct patient position, special equipment/implants available, medications/allergies/relevent history reviewed, required imaging and test results available.  Performed  Patient placed on cardiac monitor, pulse oximetry, supplemental oxygen as necessary.  Sedation given:  Propofol per anesthesiology Pacer pads placed anterior and posterior chest.  Cardioverted 1 time(s).  Cardioverted at 200J.  Evaluation Findings: Post procedure EKG shows: NSR Complications: None Patient did tolerate procedure well.   Marca Ancona 08/17/2023, 10:59 AM

## 2023-08-17 NOTE — Anesthesia Preprocedure Evaluation (Signed)
 Anesthesia Evaluation  Patient identified by MRN, date of birth, ID band Patient awake    Reviewed: Allergy & Precautions, NPO status , Patient's Chart, lab work & pertinent test results  History of Anesthesia Complications Negative for: history of anesthetic complications  Airway Mallampati: III  TM Distance: >3 FB Neck ROM: Full    Dental  (+) Edentulous Lower, Edentulous Upper, Dental Advisory Given   Pulmonary shortness of breath and Long-Term Oxygen Therapy, COPD,  COPD inhaler, former smoker   breath sounds clear to auscultation       Cardiovascular hypertension, + angina  + CAD, + Peripheral Vascular Disease and +CHF  + dysrhythmias Atrial Fibrillation  Rhythm:Irregular  1. Left ventricular ejection fraction, by estimation, is 20 to 25%. The  left ventricle has severely decreased function. The left ventricle  demonstrates global hypokinesis. There is mild concentric left ventricular  hypertrophy. Left ventricular diastolic   function could not be evaluated.   2. Right ventricular systolic function is severely reduced. The right  ventricular size is mildly enlarged. Tricuspid regurgitation signal is  inadequate for assessing PA pressure.   3. Left atrial size was mildly dilated.   4. The mitral valve is normal in structure. Trivial mitral valve  regurgitation. No evidence of mitral stenosis.   5. The aortic valve has been repaired/replaced. Aortic valve  regurgitation is not visualized. No aortic stenosis is present. Echo  findings are consistent with normal structure and function of the aortic  valve prosthesis.      Mid LAD lesion is 100% stenosed.   Prox Cx lesion is 100% stenosed.   Mid Cx lesion is 100% stenosed.   Prox RCA to Dist RCA lesion is 100% stenosed.   Origin to Prox Graft lesion is 100% stenosed.   Lat 1st Diag lesion is 90% stenosed.   Insertion lesion is 90% stenosed.   Prox LAD lesion is 95%  stenosed.   Seq SVG- RPDA-RPL and is large.   LIMA and is normal in caliber.   SVG and is normal in caliber.   The graft exhibits no disease.   The graft exhibits no disease.   1. Low cardiac index at 1.97.  2. Filling pressures upper normal (PCWP 15, RA pressure 8).  3. Mild pulmonary venous hypertension.  4. PAPi preserved.  5. He had a patent sequential SVG-PLV/PDA.  RCA known to be occluded and not injected.  SVG-OM known to be occluded and not injected.  The proximal LCx is occluded after a high OM1.  LIMA-LAD has a 90% stenosis with some haziness at the touchdown on the distal LAD.  This is likely the culprit for NSTEMI.     Patient is not having chest pain.  I reviewed films with Dr. Clifton May.  The LAD is small in caliber at the LIMA touchdown site and not favorable for PCI so we plan to medically manage.        Neuro/Psych negative neurological ROS  negative psych ROS   GI/Hepatic Neg liver ROS,,,  Endo/Other  diabetesHypothyroidism    Renal/GU Renal InsufficiencyRenal diseaseLab Results      Component                Value               Date                      CREATININE  1.44 (H)            08/17/2023           Lab Results      Component                Value               Date                      NA                       136                 08/17/2023                K                        3.8                 08/17/2023                CO2                      29                  08/17/2023                GLUCOSE                  180 (H)             08/17/2023                BUN                      24 (H)              08/17/2023                CREATININE               1.44 (H)            08/17/2023                CALCIUM                  8.3 (L)             08/17/2023                GFR                      84.51               10/29/2012                EGFR                     78                  07/27/2023                GFRNONAA                  50 (L)              08/17/2023  Musculoskeletal   Abdominal   Peds  Hematology Lab Results      Component                Value               Date                      WBC                      10.6 (H)            08/17/2023                HGB                      12.9 (L)            08/17/2023                HCT                      41.0                08/17/2023                MCV                      85.8                08/17/2023                PLT                      191                 08/17/2023              Anesthesia Other Findings 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  He took a total of 3 sublingual nitroglycerin over the past 2 weeks ago with mild relief of chest discomfort.  Chest pain started getting better over the past 1 week, and since for the past 1 week patient has noticed mild generalized weakness, lack of appetite.  He has not had any further chest pain.  In view of continued worsening fatigue, not feeling well, his wife coaxed him to call EMS.  He was picked up by EMS and STEMI was activated in view of ST elevations noted in V1-V4.  Reproductive/Obstetrics                              Anesthesia Physical Anesthesia Plan  ASA: 4  Anesthesia Plan: General   Post-op Pain Management: Minimal or no pain anticipated   Induction: Intravenous  PONV Risk Score and Plan: 2 and Propofol infusion and Treatment may vary due to age or medical condition  Airway Management  Planned: Nasal Cannula, Natural Airway and  Simple Face Mask  Additional Equipment: None  Intra-op Plan:   Post-operative Plan:   Informed Consent: I have reviewed the patients History and Physical, chart, labs and discussed the procedure including the risks, benefits and alternatives for the proposed anesthesia with the patient or authorized representative who has indicated his/her understanding and acceptance.     Dental advisory given  Plan Discussed with: CRNA  Anesthesia Plan Comments:          Anesthesia Quick Evaluation

## 2023-08-17 NOTE — Progress Notes (Signed)
 PHARMACY - ANTICOAGULATION CONSULT NOTE  Pharmacy Consult for heparin IV Infusion Indication: chest pain/ACS  Allergies  Allergen Reactions   Prednisone Other (See Comments)    Pt states "sugar went to 580"   Januvia [Sitagliptin] Nausea And Vomiting   Jardiance [Empagliflozin] Rash    Tolerated Farxiga samples   Lisinopril Cough    Patient Measurements: Height: 5\' 7"  (170.2 cm) Weight: 99.5 kg (219 lb 5.7 oz) IBW/kg (Calculated) : 66.1 Heparin Dosing Weight: 89 kg  Vital Signs: Temp: 98.3 F (36.8 C) (03/13 1900) Temp Source: Oral (03/13 1900) BP: 128/77 (03/13 2330) Pulse Rate: 125 (03/13 2330)  Labs: Recent Labs    08/14/23 0511 08/14/23 1233 08/15/23 0755 08/15/23 2054 08/16/23 0457 08/16/23 0940 08/16/23 1231 08/17/23 0000  HGB 12.9*  --  12.8*  --  12.8* 13.6  13.9  --   --   HCT 41.6  --  39.4  --  40.9 40.0  41.0  --   --   PLT 192  --  185  --  185  --   --   --   APTT 41*   < > 62* 112* 107*  --   --  147*  HEPARINUNFRC >1.10*  --  >1.10*  --  >1.10*  --   --   --   CREATININE  --    < > 1.54*  --  1.37*  --  1.44* 1.46*   < > = values in this interval not displayed.    Estimated Creatinine Clearance: 48.4 mL/min (A) (by C-G formula based on SCr of 1.46 mg/dL (H)).   Assessment: Matthew May is a 78 y.o. year old male admitted on 08/13/2023 with CP and concern for unstable angina. Eliquis prior to admission for afib (last dose 3/10 @ 0530). Pharmacy consulted to dose heparin.  aPTT 147 seconds (supra-therapeutic) on 1600 units/hr s/p restart after cath lab. Per RN, drawn appropriately, no signs/symptoms of bleeding or issues with the heparin infusion running continuously. Planning TEE-guided DCCV tomorrow.  Goal of Therapy:  Heparin level 0.3-0.7 units/ml aPTT 66-102 secs Monitor platelets by anticoagulation protocol: Yes   Plan:  Decrease heparin infusion to 1200 units/hr F/u 8hr aPTT Daily heparin level, aPTT and CBC Can discontinue  aPTTs once heparin level correlates  Arabella Merles, PharmD. Clinical Pharmacist 08/17/2023 1:00 AM

## 2023-08-17 NOTE — Progress Notes (Signed)
 Physical Therapy Evaluation Patient Details Name: Matthew May MRN: [id number] DOB: 1945-09-11 Today's Date: 08/17/2023  History of Present Illness  78 yo male admitted 3/10 with chest pain and cardiogenic shock, NSTEMI. 3/13 RHC/LHC. 3/14 s/p TEE. PMhx: CAD, CABG, DM, PAF, AS s/p TAVR, COPD, HTN, HLD  Clinical Impression  Pt in bed upon arrival with family present and agreeable to PT eval. PTA, pt occasionally needed assistance from family to stand and ambulate a short distance with a rollator/RW. In today's session, pt needed MaxA to move from supine/sit. Pt had difficulty moving LE's off EOB and shifting hips once in sitting. Pt needed MinA to CGA for seated balance with a tendency to lean to the left. Pt was able to stand and perform step-pivot transfer with [facility] and MaxAx2. Pt currently with functional limitations due to the deficits listed below (see PT Problem List). Pt would benefit from acute skilled PT to address functional impairments. Recommending post-acute rehab <3hrs to work towards independence with mobility. Acute PT to follow.         If plan is discharge home, recommend the following: A lot of help with walking and/or transfers;A lot of help with bathing/dressing/bathroom;Assistance with cooking/housework;Assist for transportation;Help with stairs or ramp for entrance   Can travel by private vehicle   No    Equipment Recommendations BSC/3in1  Recommendations for Other Services  OT consult    Functional Status Assessment Patient has had a recent decline in their functional status and demonstrates the ability to make significant improvements in function in a reasonable and predictable amount of time.     Precautions / Restrictions Precautions Precautions: Fall;Other (comment) Recall of Precautions/Restrictions: Impaired Precaution/Restrictions Comments: LHC on 3/13, no push/pull/lift >5# Restrictions Weight Bearing Restrictions Per Provider Order: No       Mobility  Bed Mobility Overal bed mobility: Needs Assistance Bed Mobility: Supine to Sit     Supine to sit: Max assist     General bed mobility comments: pt able to slightly move LE's towards EOB, needed assistance to complete movement. Pt moved into long sitting and did not want to roll. Needed MaxA to shift hips forwards. Increased time and effort    Transfers Overall transfer level: Needs assistance Equipment used: 2 person hand held assist Transfers: Sit to/from Stand, Bed to chair/wheelchair/BSC Sit to Stand: Max assist, +2 physical assistance, +2 safety/equipment   Step pivot transfers: Max assist, +2 physical assistance, +2 safety/equipment       General transfer comment: Able to partially stand with MaxAx2 with pt holding onto PT/NT posterior elbows. Unable to maintain upright posture with pivotal steps towards recliner. Poor eccentric lowering    Ambulation/Gait    General Gait Details: unable this date    Balance Overall balance assessment: Needs assistance, Mild deficits observed, not formally tested Sitting-balance support: No upper extremity supported, Feet supported Sitting balance-Leahy Scale: Fair Sitting balance - Comments: leans to the left, required MinA to CGA for balance Postural control: Left lateral lean Standing balance support: Bilateral upper extremity supported, During functional activity, Reliant on assistive device for balance Standing balance-Leahy Scale: Zero Standing balance comment: unable to maintain balance without MaxAx2       Pertinent Vitals/Pain Pain Assessment Pain Assessment: No/denies pain    Home Living Family/patient expects to be discharged to:: Private residence Living Arrangements: Spouse/significant other;Children (son) Available Help at Discharge: Family;Available 24 hours/day Type of Home: House Home Access: Stairs to enter;Ramped entrance Entrance Stairs-Rails: None Entrance Stairs-Number of Steps: 3  Home  Layout: One level Home Equipment: Rollator (4 wheels);Rolling Walker (2 wheels) Additional Comments: plan to get shower seat, can get WC    Prior Function Prior Level of Function : Needs assist      Physical Assist : Mobility (physical);ADLs (physical) Mobility (physical): Transfers;Gait ADLs (physical): Bathing;Dressing Mobility Comments: used rollator at baseline, occasionally needed help to stand and ambulate a short distance ADLs Comments: occasionally needed assist for dressing and bathing     Extremity/Trunk Assessment   Upper Extremity Assessment Upper Extremity Assessment: Defer to OT evaluation    Lower Extremity Assessment Lower Extremity Assessment: Generalized weakness (alert to light touch)    Cervical / Trunk Assessment Cervical / Trunk Assessment: Kyphotic  Communication   Communication Communication: Other (comment) (mumbles, difficulty to comprehend) Factors Affecting Communication: Reduced clarity of speech;Hearing impaired    Cognition Arousal: Alert Behavior During Therapy: WFL for tasks assessed/performed   PT - Cognitive impairments: Sequencing, Safety/Judgement, Problem solving    PT - Cognition Comments: A&Ox4, however, had difficulty comprehending how to sequence scooting towards EOB. Wanted to stand before having feet flat on the ground. Was not aware of how much assistance he needed to transfer Following commands: Impaired Following commands impaired: Follows one step commands with increased time     Cueing Cueing Techniques: Verbal cues, Tactile cues     General Comments General comments (skin integrity, edema, etc.): R great toe nail hanging, dry and patchy skin on B lower legs. Daughter present during session     PT Assessment Patient needs continued PT services  PT Problem List Decreased strength;Decreased activity tolerance;Decreased balance;Decreased mobility;Decreased knowledge of use of DME;Decreased safety awareness;Cardiopulmonary  status limiting activity       PT Treatment Interventions DME instruction;Gait training;Functional mobility training;Therapeutic activities;Therapeutic exercise;Balance training;Neuromuscular re-education;Patient/family education;Wheelchair mobility training    PT Goals (Current goals can be found in the Care Plan section)  Acute Rehab PT Goals Patient Stated Goal: to get better PT Goal Formulation: With patient/family Time For Goal Achievement: 08/31/23 Potential to Achieve Goals: Good    Frequency Min 2X/week        AM-PAC PT "6 Clicks" Mobility  Outcome Measure Help needed turning from your back to your side while in a flat bed without using bedrails?: A Lot Help needed moving from lying on your back to sitting on the side of a flat bed without using bedrails?: A Lot Help needed moving to and from a bed to a chair (including a wheelchair)?: Total Help needed standing up from a chair using your arms (e.g., wheelchair or bedside chair)?: Total Help needed to walk in hospital room?: Total Help needed climbing 3-5 steps with a railing? : Total 6 Click Score: 8    End of Session Equipment Utilized During Treatment: Gait belt;Oxygen Activity Tolerance: Patient tolerated treatment well Patient left: in chair;with call bell/phone within reach;with chair alarm set;with family/visitor present Nurse Communication: Mobility status PT Visit Diagnosis: Unsteadiness on feet (R26.81);Other abnormalities of gait and mobility (R26.89);Muscle weakness (generalized) (M62.81)    Time: 2952-8413 PT Time Calculation (min) (ACUTE ONLY): 43 min   Charges:   PT Evaluation $PT Eval Low Complexity: 1 Low PT Treatments $Therapeutic Activity: 38-52 mins PT General Charges $$ ACUTE PT VISIT: 1 Visit        Hilton Cork, PT, DPT Secure Chat Preferred  Rehab Office [phone]   Arturo Morton Brion Aliment 08/17/2023, 2:26 PM

## 2023-08-17 NOTE — Interval H&P Note (Signed)
 History and Physical Interval Note:  08/17/2023 10:33 AM  Matthew May  has presented today for surgery, with the diagnosis of afib.  The various methods of treatment have been discussed with the patient and family. After consideration of risks, benefits and other options for treatment, the patient has consented to  Procedure(s): TRANSESOPHAGEAL ECHOCARDIOGRAM (N/A) CARDIOVERSION (N/A) as a surgical intervention.  The patient's history has been reviewed, patient examined, no change in status, stable for surgery.  I have reviewed the patient's chart and labs.  Questions were answered to the patient's satisfaction.     [name] [employer]

## 2023-08-17 NOTE — Progress Notes (Signed)
 PT Cancellation Note  Patient Details Name: ARIF AMENDOLA MRN: [id number] DOB: 25-Oct-1945   Cancelled Treatment:    Reason Eval/Treat Not Completed: Patient at procedure or test/unavailable (Pt off unit for TEE. Acute PT to follow and re-attempt as schedule allows.)  Hilton Cork, PT, DPT Secure Chat Preferred  Rehab Office [phone]   Arturo Morton Brion Aliment 08/17/2023, 8:34 AM

## 2023-08-17 NOTE — Progress Notes (Signed)
 PHARMACY - ANTICOAGULATION CONSULT NOTE  Pharmacy Consult for heparin IV Infusion Indication: chest pain/ACS  Allergies  Allergen Reactions   Prednisone Other (See Comments)    Pt states "sugar went to 580"   Januvia [Sitagliptin] Nausea And Vomiting   Jardiance [Empagliflozin] Rash    Tolerated Farxiga samples   Lisinopril Cough    Patient Measurements: Height: 5\' 7"  (170.2 cm) Weight: 95.3 kg (210 lb 1.6 oz) IBW/kg (Calculated) : 66.1 Heparin Dosing Weight: 89 kg  Vital Signs: Temp: 98 F (36.7 C) (03/14 2000) Temp Source: Oral (03/14 2000) BP: 117/66 (03/14 2000) Pulse Rate: 96 (03/14 2000)  Labs: Recent Labs    08/15/23 0755 08/15/23 2054 08/16/23 0457 08/16/23 0940 08/16/23 1231 08/17/23 0000 08/17/23 0500 08/17/23 1200 08/17/23 1210 08/17/23 2016  HGB 12.8*  --  12.8* 13.6  13.9  --   --  12.9*  --   --   --   HCT 39.4  --  40.9 40.0  41.0  --   --  41.0  --   --   --   PLT 185  --  185  --   --   --  191  --   --   --   APTT 62*   < > 107*  --   --  147*  --   --  43* 55*  HEPARINUNFRC >1.10*  --  >1.10*  --   --   --   --  0.55  --   --   CREATININE 1.54*  --  1.37*  --  1.44* 1.46* 1.44*  --   --   --    < > = values in this interval not displayed.    Estimated Creatinine Clearance: 48 mL/min (A) (by C-G formula based on SCr of 1.44 mg/dL (H)).   Assessment: Matthew May is a 78 y.o. year old male admitted on 08/13/2023 with CP and concern for unstable angina. Eliquis prior to admission for afib (last dose 3/10 @ 0530). Pharmacy consulted to dose heparin.  Underwent cardioversion this morning. aPTT obtained after returning to the floor is 43 seconds (subtherapeutic) on 1200 units/hr s/p rate decrease. No issues with infusion or s/sx of bleeding per RN. CBC stable.  Heparin is currently running in the PICC line so level being drawn peripherally. aPTT this evening came back subtherapeutic at 55, on heparin infusion at 1400 units/hr. No s/sx of  bleeding or infusion issues.   Goal of Therapy:  Heparin level 0.3-0.7 units/ml aPTT 66-102 secs Monitor platelets by anticoagulation protocol: Yes   Plan:  Increase heparin infusion to 1650 units/hr F/u 8hr aPTT Daily heparin level, aPTT and CBC Can discontinue aPTTs once heparin level correlates  Thank you for allowing pharmacy to participate in this patient's care,  Sherron Monday, PharmD, BCCCP Clinical Pharmacist  Phone: [phone] 08/17/2023 9:23 PM  Please check AMION for all [facility] Pharmacy phone numbers After 10:00 PM, call Main Pharmacy [phone]

## 2023-08-17 NOTE — Transfer of Care (Signed)
 Immediate Anesthesia Transfer of Care Note  Patient: Matthew May  Procedure(s) Performed: TRANSESOPHAGEAL ECHOCARDIOGRAM CARDIOVERSION  Patient Location: PACU  Anesthesia Type:MAC  Level of Consciousness: awake, drowsy, patient cooperative, and responds to stimulation  Airway & Oxygen Therapy: Patient Spontanous Breathing and Patient connected to nasal cannula oxygen  Post-op Assessment: Report given to RN and Post -op Vital signs reviewed and stable  Post vital signs: Reviewed and stable  Last Vitals:  Vitals Value Taken Time  BP 153/72   Temp 98   Pulse 99   Resp 18   SpO2 93     Last Pain:  Vitals:   08/17/23 0808  TempSrc: Temporal  PainSc: 0-No pain         Complications: No notable events documented.

## 2023-08-17 NOTE — Progress Notes (Signed)
  Echocardiogram Echocardiogram Transesophageal has been performed.  Janalyn Harder 08/17/2023, 11:10 AM

## 2023-08-17 NOTE — Progress Notes (Signed)
 PHARMACY - ANTICOAGULATION CONSULT NOTE  Pharmacy Consult for heparin IV Infusion Indication: chest pain/ACS  Allergies  Allergen Reactions   Prednisone Other (See Comments)    Pt states "sugar went to 580"   Januvia [Sitagliptin] Nausea And Vomiting   Jardiance [Empagliflozin] Rash    Tolerated Farxiga samples   Lisinopril Cough    Patient Measurements: Height: 5\' 7"  (170.2 cm) Weight: 95.3 kg (210 lb 1.6 oz) IBW/kg (Calculated) : 66.1 Heparin Dosing Weight: 89 kg  Vital Signs: Temp: 97.6 F (36.4 C) (03/14 1140) Temp Source: Temporal (03/14 0808) BP: 155/77 (03/14 1300) Pulse Rate: 100 (03/14 1300)  Labs: Recent Labs    08/15/23 0755 08/15/23 2054 08/16/23 0457 08/16/23 0940 08/16/23 1231 08/17/23 0000 08/17/23 0500 08/17/23 1200 08/17/23 1210  HGB 12.8*  --  12.8* 13.6  13.9  --   --  12.9*  --   --   HCT 39.4  --  40.9 40.0  41.0  --   --  41.0  --   --   PLT 185  --  185  --   --   --  191  --   --   APTT 62*   < > 107*  --   --  147*  --   --  43*  HEPARINUNFRC >1.10*  --  >1.10*  --   --   --   --  0.55  --   CREATININE 1.54*  --  1.37*  --  1.44* 1.46* 1.44*  --   --    < > = values in this interval not displayed.    Estimated Creatinine Clearance: 48 mL/min (A) (by C-G formula based on SCr of 1.44 mg/dL (H)).   Assessment: Matthew May is a 78 y.o. year old male admitted on 08/13/2023 with CP and concern for unstable angina. Eliquis prior to admission for afib (last dose 3/10 @ 0530). Pharmacy consulted to dose heparin.  Underwent cardioversion this morning. aPTT obtained after returning to the floor is 43 seconds (subtherapeutic) on 1200 units/hr s/p rate decrease. No issues with infusion or s/sx of bleeding per RN. CBC stable.  Levels have been drawn from PICC due to lack of peripheral line, however these levels have been variable. Will order next lab to be drawn peripherally to hopefully get more accurate levels.  Goal of Therapy:  Heparin  level 0.3-0.7 units/ml aPTT 66-102 secs Monitor platelets by anticoagulation protocol: Yes   Plan:  Increase heparin infusion to 1400 units/hr F/u 8hr aPTT Daily heparin level, aPTT and CBC Can discontinue aPTTs once heparin level correlates  Matthew May, PharmD PGY1 Pharmacy Resident 08/17/2023 1:56 PM

## 2023-08-17 NOTE — Progress Notes (Addendum)
 Advanced Heart Failure Rounding Note  Cardiologist: Rollene Rotunda, MD   Chief Complaint: Biventricular Heart Failure  Subjective:    CO-OX 66%. Not on inotrope support.  2.5L UOP last 24 hrs with lasix gtt at 5/hr. CVP 5.   Remains in Afib 110s-120s on amiodarone gtt.   Sleepy but easily aroused. No complaints this am.     Objective:    Weight Range: 95.3 kg Body mass index is 32.91 kg/m.   Vital Signs:   Temp:  [97.6 F (36.4 C)-98.5 F (36.9 C)] 98.3 F (36.8 C) (03/13 1900) Pulse Rate:  [0-129] 107 (03/14 0700) Resp:  [16-35] 23 (03/14 0700) BP: (90-185)/(33-149) 147/72 (03/14 0700) SpO2:  [89 %-99 %] 96 % (03/14 0700) Weight:  [95.3 kg-99.5 kg] 95.3 kg (03/14 0500) Last BM Date : 08/13/23  Weight change: Filed Weights   08/16/23 0500 08/16/23 0800 08/17/23 0500  Weight: 99.5 kg 99.5 kg 95.3 kg   Intake/Output:  Intake/Output Summary (Last 24 hours) at 08/17/2023 0739 Last data filed at 08/17/2023 0700 Gross per 24 hour  Intake 773.8 ml  Output 2550 ml  Net -1776.2 ml    Physical Exam    General:  Chronically ill appearing elderly male Neck: no JVD.  Cor: Irregularly irregular rhythm, tachy. No rubs, gallops or murmurs. Lungs: clear Abdomen: soft, nontender, nondistended.  Extremities: no edema Neuro: alert & orientedx3. Affect pleasant   Telemetry   AF 110s-120s   Labs    CBC Recent Labs    08/16/23 0457 08/16/23 0940 08/17/23 0500  WBC 10.6*  --  10.6*  HGB 12.8* 13.6  13.9 12.9*  HCT 40.9 40.0  41.0 41.0  MCV 85.7  --  85.8  PLT 185  --  191   Basic Metabolic Panel Recent Labs    46/96/29 0000 08/17/23 0500  NA 135 136  K 4.0 3.8  CL 94* 94*  CO2 30 29  GLUCOSE 249* 180*  BUN 26* 24*  CREATININE 1.46* 1.44*  CALCIUM 8.0* 8.3*  MG 2.1 2.2   Liver Function Tests No results for input(s): "AST", "ALT", "ALKPHOS", "BILITOT", "PROT", "ALBUMIN" in the last 72 hours. No results for input(s): "LIPASE", "AMYLASE" in  the last 72 hours. Cardiac Enzymes No results for input(s): "CKTOTAL", "CKMB", "CKMBINDEX", "TROPONINI" in the last 72 hours.  BNP: BNP (last 3 results) Recent Labs    08/13/23 1151 08/14/23 0511  BNP 324.7* 377.8*   Fasting Lipid Panel No results for input(s): "CHOL", "HDL", "LDLCALC", "TRIG", "CHOLHDL", "LDLDIRECT" in the last 72 hours.  Imaging   CARDIAC CATHETERIZATION Result Date: 08/16/2023   Mid LAD lesion is 100% stenosed.   Prox Cx lesion is 100% stenosed.   Mid Cx lesion is 100% stenosed.   Prox RCA to Dist RCA lesion is 100% stenosed.   Origin to Prox Graft lesion is 100% stenosed.   Lat 1st Diag lesion is 90% stenosed.   Insertion lesion is 90% stenosed.   Prox LAD lesion is 95% stenosed.   Seq SVG- RPDA-RPL and is large.   LIMA and is normal in caliber.   SVG and is normal in caliber.   The graft exhibits no disease.   The graft exhibits no disease. 1. Low cardiac index at 1.97. 2. Filling pressures upper normal (PCWP 15, RA pressure 8). 3. Mild pulmonary venous hypertension. 4. PAPi preserved. 5. He had a patent sequential SVG-PLV/PDA.  RCA known to be occluded and not injected.  SVG-OM known to be occluded  and not injected.  The proximal LCx is occluded after a high OM1.  LIMA-LAD has a 90% stenosis with some haziness at the touchdown on the distal LAD.  This is likely the culprit for NSTEMI.  Patient is not having chest pain.  I reviewed films with Dr. Clifton James.  The LAD is small in caliber at the LIMA touchdown site and not favorable for PCI so we plan to medically manage.   Medications:    Scheduled Medications:  Chlorhexidine Gluconate Cloth  6 each Topical Daily   clopidogrel  75 mg Oral Daily   dapagliflozin propanediol  10 mg Oral Daily   digoxin  0.125 mg Oral Daily   feeding supplement  237 mL Oral BID BM   fluticasone furoate-vilanterol  1 puff Inhalation Daily   And   umeclidinium bromide  1 puff Inhalation Daily   gabapentin  100 mg Oral TID   glimepiride  4  mg Oral Q breakfast   levothyroxine  150 mcg Oral Q0600   loratadine  10 mg Oral Daily   mupirocin ointment   Nasal BID   potassium chloride SA  20 mEq Oral Daily   QUEtiapine  25 mg Oral QHS   rosuvastatin  40 mg Oral Daily   sodium chloride flush  10-40 mL Intracatheter Q12H   sodium chloride flush  3 mL Intravenous Q12H   spironolactone  12.5 mg Oral Daily   Vitamin D (Ergocalciferol)  50,000 Units Oral Weekly    Infusions:  sodium chloride     amiodarone 30 mg/hr (08/17/23 0700)   furosemide (LASIX) 200 mg in dextrose 5 % 100 mL (2 mg/mL) infusion 5 mg/hr (08/17/23 0700)   heparin 1,200 Units/hr (08/17/23 0700)    PRN Medications: sodium chloride, acetaminophen, acetaminophen, acetaminophen, albuterol, nitroGLYCERIN, ondansetron (ZOFRAN) IV, ondansetron (ZOFRAN) IV, sodium chloride flush, sodium chloride flush  Patient Profile   Mr. Lecomte is a 78 year old white male with CAD status post CABG in 2011, severe aortic stenosis status post TAVR in 2021, COPD, hyperlipidemia, type 2 diabetes and paroxysmal atrial fibrillation/flutter currently admitted with concerns for cardiogenic shock.   Assessment/Plan   Acute Biventricular HFrEF CAD  - suspected completed infarct. Suspect mixed etiology with CAD and tachy mediated.  - Echo with EF down to 20-25% from previous 70-75% - [facility] 03/13: Patent sequential SVG-PLV/PDA, RCA known to be occluded, SVG to OM known to be occluded, p LCX occluded, LIMA to LAD w/ 90% stenosis at touchdown to distal LAD (likely culprit, small, not amenable to PCI), upper normal filling pressures with CI 1.97 - CO-OX okay. CVP 5. Stop lasix gtt. Start po lasix tomorrow. - BP trending up. Add losartan 25 mg daily. - BB on hold - on Farxiga 10 mg daily - start spiro 12.5 mg daily - continue digoxin 0.125 mg daily.     CAD - H/O CABG 2011 LIMA to LAD, SVG to OM and sequential SVG to PDA and PL branch - Chest pain started a few weeks ago. Suspected  completed infart. HS Trop [id number] - LHC as above. Suspect culprit was 90% stenosis LIMA to LAD at touchdown to distal LAD. Med management recommended - no recurrent chest pain - aspirin switched to plavix d/t NSTEMI. Continue crestor.  - Holding bb due to possible shock.  Acute Respiratory Distress - CXR w pulm edema  - Improved with diuresis. Now on RA.   A flutter/Afib with RVR - EKG at PCP in [place] looked like A fib RVR. He declined  further work up per his wife.  - A Flutter RVR on admit. Currently Afib 110s-120s  - Continue amio drip + heparin drip.  - TEE/DCCV today - TSH okay Informed Consent   Shared Decision Making/Informed Consent   The risks [stroke, cardiac arrhythmias rarely resulting in the need for a temporary or permanent pacemaker, skin irritation or burns, esophageal damage, perforation (1:10,000 risk), bleeding, pharyngeal hematoma as well as other potential complications associated with conscious sedation including aspiration, arrhythmia, respiratory failure and death], benefits (treatment guidance, restoration of normal sinus rhythm, diagnostic support) and alternatives of a transesophageal echocardiogram guided cardioversion were discussed in detail with Mr. Riese and he is willing to proceed.       AKI - cardiorenal; improving with decongestion - creatinine stable today 1.4  H/o TAVR - Normal valve function on echo this admission.     Will need aggressive PT/OT  Code status: DNR   Length of Stay: 4  CRITICAL CARE Performed by: Anna Genre N   Total critical care time: 15 minutes  Critical care time was exclusive of separately billable procedures and treating other patients.  Critical care was necessary to treat or prevent imminent or life-threatening deterioration.  Critical care was time spent personally by me on the following activities: development of treatment plan with patient and/or surrogate as well as nursing, discussions with  consultants, evaluation of patient's response to treatment, examination of patient, obtaining history from patient or surrogate, ordering and performing treatments and interventions, ordering and review of laboratory studies, ordering and review of radiographic studies, pulse oximetry and re-evaluation of patient's condition.   FINCH, LINDSAY N, PA-C  08/17/2023, 7:39 AM  Advanced Heart Failure Team Pager [phone] (M-F; 7a - 5p)  Please contact [facility] for night-coverage after hours (5p -7a ) and weekends on [email or web address]  Patient seen with PA, I formulated the plan and agree with the above note.   Co-ox 66% today with CVP 5.  Good diuresis yesterday, net negative 1776 cc.  Breathing improved.   TEE done today, EF 40-45% with mild RV dysfunction, normally functioning TAVR valve. DCCV done with conversion to NSR.   General: NAD Neck: No JVD, no thyromegaly or thyroid nodule.  Lungs: Clear to auscultation bilaterally with normal respiratory effort. CV: Nondisplaced PMI.  Heart irregular S1/S2, no S3/S4, no murmur.  No peripheral edema.  Abdomen: Soft, nontender, no hepatosplenomegaly, no distention.  Skin: Intact without lesions or rashes.  Neurologic: Alert and oriented x 3.  Psych: Normal affect. Extremities: No clubbing or cyanosis.  HEENT: Normal.   LV EF looked better on TEE today than TTE (EF 40-45%).  CVP 5, co-ox 66%.  - Can stop Lasix gtt, start po Lasix tomorrow.  - Continue Farxiga - Continue digoxin for now, probably can stop before discharge with EF looking better.  - Start spironolactone 12.5 daily and losartan 25 daily.   He is now back in NSR after DCCV, continue amiodarone gtt 30 mg/hr for now, hopefully to po tomorrow. He has been on heparin gtt, will make transition tomorrow to apixaban.   Off ASA with anticoagulation, continue Plavix with NSTEMI and continue statin.   CRITICAL CARE Performed by: Marca Ancona  Total critical care time: 35  minutes  Critical care time was exclusive of separately billable procedures and treating other patients.  Critical care was necessary to treat or prevent imminent or life-threatening deterioration.  Critical care was time spent personally by me on the following activities: development of treatment plan  with patient and/or surrogate as well as nursing, discussions with consultants, evaluation of patient's response to treatment, examination of patient, obtaining history from patient or surrogate, ordering and performing treatments and interventions, ordering and review of laboratory studies, ordering and review of radiographic studies, pulse oximetry and re-evaluation of patient's condition.  Marca Ancona 08/17/2023 11:04 AM

## 2023-08-18 DIAGNOSIS — I200 Unstable angina: Secondary | ICD-10-CM | POA: Diagnosis not present

## 2023-08-18 LAB — CBC
HCT: 39.2 % (ref 39.0–52.0)
Hemoglobin: 12.4 g/dL — ABNORMAL LOW (ref 13.0–17.0)
MCH: 27 pg (ref 26.0–34.0)
MCHC: 31.6 g/dL (ref 30.0–36.0)
MCV: 85.4 fL (ref 80.0–100.0)
Platelets: 203 10*3/uL (ref 150–400)
RBC: 4.59 MIL/uL (ref 4.22–5.81)
RDW: 14 % (ref 11.5–15.5)
WBC: 11.6 10*3/uL — ABNORMAL HIGH (ref 4.0–10.5)
nRBC: 0 % (ref 0.0–0.2)

## 2023-08-18 LAB — GLUCOSE, CAPILLARY
Glucose-Capillary: 152 mg/dL — ABNORMAL HIGH (ref 70–99)
Glucose-Capillary: 128 mg/dL — ABNORMAL HIGH (ref 70–99)
Glucose-Capillary: 157 mg/dL — ABNORMAL HIGH (ref 70–99)
Glucose-Capillary: 168 mg/dL — ABNORMAL HIGH (ref 70–99)
Glucose-Capillary: 64 mg/dL — ABNORMAL LOW (ref 70–99)
Glucose-Capillary: 78 mg/dL (ref 70–99)

## 2023-08-18 LAB — BASIC METABOLIC PANEL
Anion gap: 12 (ref 5–15)
BUN: 23 mg/dL (ref 8–23)
CO2: 30 mmol/L (ref 22–32)
Calcium: 8.6 mg/dL — ABNORMAL LOW (ref 8.9–10.3)
Chloride: 96 mmol/L — ABNORMAL LOW (ref 98–111)
Creatinine, Ser: 1.53 mg/dL — ABNORMAL HIGH (ref 0.61–1.24)
GFR, Estimated: 47 mL/min — ABNORMAL LOW (ref >60)
Glucose, Bld: 137 mg/dL — ABNORMAL HIGH (ref 70–99)
Potassium: 3.9 mmol/L (ref 3.5–5.1)
Sodium: 138 mmol/L (ref 135–145)

## 2023-08-18 LAB — COOXEMETRY PANEL
Carboxyhemoglobin: 2 % — ABNORMAL HIGH (ref 0.5–1.5)
Methemoglobin: <0.7 % (ref 0.0–1.5)
O2 Saturation: 69.7 %
Total hemoglobin: 12.9 g/dL (ref 12.0–16.0)

## 2023-08-18 LAB — MAGNESIUM: Magnesium: 2.3 mg/dL (ref 1.7–2.4)

## 2023-08-18 LAB — HEPARIN LEVEL (UNFRACTIONATED): Heparin Unfractionated: 0.86 [IU]/mL — ABNORMAL HIGH (ref 0.30–0.70)

## 2023-08-18 LAB — APTT: aPTT: 74 s — ABNORMAL HIGH (ref 24–36)

## 2023-08-18 MED ORDER — AMIODARONE HCL 200 MG PO TABS
400.0000 mg | ORAL_TABLET | Freq: Two times a day (BID) | ORAL | Status: DC
  Administered 2023-08-18 – 2023-08-20 (×6): 400 mg via ORAL
  Filled 2023-08-18 (×7): qty 2

## 2023-08-18 MED ORDER — FUROSEMIDE 40 MG PO TABS
40.0000 mg | ORAL_TABLET | Freq: Every day | ORAL | Status: DC
  Administered 2023-08-18 – 2023-08-20 (×3): 40 mg via ORAL
  Filled 2023-08-18 (×3): qty 1

## 2023-08-18 MED ORDER — APIXABAN 5 MG PO TABS
5.0000 mg | ORAL_TABLET | Freq: Two times a day (BID) | ORAL | Status: DC
  Administered 2023-08-18 – 2023-08-20 (×6): 5 mg via ORAL
  Filled 2023-08-18 (×7): qty 1

## 2023-08-18 NOTE — Progress Notes (Signed)
 PHARMACY - ANTICOAGULATION CONSULT NOTE  Pharmacy Consult for heparin IV Infusion Indication: chest pain/ACS  Allergies  Allergen Reactions   Prednisone Other (See Comments)    Pt states "sugar went to 580"   Januvia [Sitagliptin] Nausea And Vomiting   Jardiance [Empagliflozin] Rash    Tolerated Farxiga samples   Lisinopril Cough    Patient Measurements: Height: 5\' 7"  (170.2 cm) Weight: 96 kg (211 lb 10.3 oz) IBW/kg (Calculated) : 66.1 Heparin Dosing Weight: 89 kg  Vital Signs: Temp: 98 F (36.7 C) (03/15 0435) Temp Source: Oral (03/15 0435) BP: 126/64 (03/15 0435) Pulse Rate: 96 (03/15 0435)  Labs: Recent Labs    08/16/23 0457 08/16/23 0940 08/16/23 1231 08/17/23 0000 08/17/23 0500 08/17/23 1200 08/17/23 1210 08/17/23 2016 08/18/23 0500 08/18/23 0551  HGB 12.8* 13.6  13.9  --   --  12.9*  --   --   --  12.4*  --   HCT 40.9 40.0  41.0  --   --  41.0  --   --   --  39.2  --   PLT 185  --   --   --  191  --   --   --  203  --   APTT 107*  --   --  147*  --   --  43* 55*  --  74*  HEPARINUNFRC >1.10*  --   --   --   --  0.55  --   --   --  0.86*  CREATININE 1.37*  --    < > 1.46* 1.44*  --   --   --  1.53*  --    < > = values in this interval not displayed.    Estimated Creatinine Clearance: 45.4 mL/min (A) (by C-G formula based on SCr of 1.53 mg/dL (H)).   Assessment: Matthew May is a 78 y.o. year old male admitted on 08/13/2023 with CP and concern for unstable angina. Eliquis prior to admission for afib (last dose 3/10 @ 0530). Pharmacy consulted to dose heparin.  Underwent cardioversion 08/17/23.  aPTT of 74sec is therapeutic with heparin running at 1650 units/hr. Heparin level of 0.86 is not quite correlating. Hgb (12.4) and PLTs (203) are stable. Per RN, no report of pauses, issues with the line, or signs of bleeding.   Goal of Therapy:  Heparin level 0.3-0.7 units/ml aPTT 66-102 secs Monitor platelets by anticoagulation protocol: Yes   Plan:   Continue heparin infusion at 1650 units/hr Check 8 hr confirmatory aPTT Daily heparin level, aPTT and CBC Can discontinue aPTTs once heparin level correlates F/u plan to transition to oral anticoagulation   Ernestene Kiel, PharmD PGY1 Pharmacy Resident  Please check AMION for all [facility] phone numbers After 10:00 PM, call Main Pharmacy [phone] 08/18/2023 7:29 AM

## 2023-08-18 NOTE — Plan of Care (Signed)
  Problem: Education: Goal: Understanding of cardiac disease, CV risk reduction, and recovery process will improve Outcome: Progressing   Problem: Clinical Measurements: Goal: Will remain free from infection Outcome: Progressing   Problem: Clinical Measurements: Goal: Diagnostic test results will improve Outcome: Progressing   Problem: Clinical Measurements: Goal: Respiratory complications will improve Outcome: Progressing   Problem: Clinical Measurements: Goal: Cardiovascular complication will be avoided Outcome: Progressing

## 2023-08-18 NOTE — Evaluation (Signed)
 Occupational Therapy Evaluation Patient Details Name: Matthew May MRN: [id number] DOB: Mar 27, 1946 Today's Date: 08/18/2023   History of Present Illness   78 yo male admitted 3/10 with chest pain and cardiogenic shock, NSTEMI. 3/13 RHC/LHC. 3/14 s/p TEE. PMhx: CAD, CABG, DM, PAF, AS s/p TAVR, COPD, HTN, HLD     Clinical Impressions Patient is currently requiring as high as Total assistance of 2 with basic ADLs, as well as  Total assist +2 with bed mobility for sit to supine and heavy Max As for supine to sit.  Inability to stand this date due to belief that his RT LE is "broken" as well as back pain, guarding, stiffness throughout body and strong LT lean in sitting.    Current level of function is below patient's typical baseline.  Wife reports light LE Assist with ADLs and light assist with mobility PRN with pt's rollator.   During this evaluation, patient was limited by cognitive deficits, vision deficits (mac degen), anxiety/self-limiting, generalized weakness, impaired activity tolerance, and pain to RLE and back, all of which has the potential to impact patient's and/or caregivers' safety and independence during functional mobility, as well as performance for ADLs.    Patient lives with his spouse who would be unable to safely provide the necessary supervision and assistance that pt currently requires.  Patient demonstrates fair rehab potential, and should benefit from continued skilled occupational therapy services while in acute care to maximize safety, independence and quality of life at home.  Continued occupational therapy services in acute care are recommended. Patient will benefit from continued inpatient follow up therapy, <3 hours/day.  ?      If plan is discharge home, recommend the following:   Supervision due to cognitive status;Two people to help with bathing/dressing/bathroom;Two people to help with walking and/or transfers;A lot of help with walking and/or transfers;A  lot of help with bathing/dressing/bathroom     Functional Status Assessment   Patient has had a recent decline in their functional status and demonstrates the ability to make significant improvements in function in a reasonable and predictable amount of time.     Equipment Recommendations   Wheelchair (measurements OT);Tub/shower bench;BSC/3in1     Recommendations for Other Services         Precautions/Restrictions   Precautions Precautions: Fall;Other (comment) Recall of Precautions/Restrictions: Impaired Precaution/Restrictions Comments: LHC on 3/13, no push/pull/lift >5# Restrictions Weight Bearing Restrictions Per Provider Order: No     Mobility Bed Mobility Overal bed mobility: Needs Assistance Bed Mobility: Supine to Sit, Sit to Supine     Supine to sit: Max assist, Used rails, HOB elevated Sit to supine: Total assist, +2 for physical assistance, +2 for safety/equipment   General bed mobility comments: Increased time needed to pt to process sequencing. Pt anxious of falling and self-limits, requiring frequent reassurance and encouragement. Spouse very helpful wit hthis.    Transfers                          Balance Overall balance assessment: Needs assistance Sitting-balance support: Feet supported, Bilateral upper extremity supported Sitting balance-Leahy Scale: Poor Sitting balance - Comments: leans to the left, required MinA to Mod As for balance. Resists full upright position. Postural control: Left lateral lean                                 ADL either performed or assessed with  clinical judgement   ADL Overall ADL's : Needs assistance/impaired Eating/Feeding: Maximal assistance;Bed level   Grooming: Wash/dry hands;Minimal assistance;Bed level Grooming Details (indicate cue type and reason): Unable to sit EOB and release either UE for ADLs. Upper Body Bathing: Minimal assistance;Bed level   Lower Body Bathing: Total  assistance;+2 for physical assistance;Bed level   Upper Body Dressing : Moderate assistance;Cueing for sequencing;Bed level   Lower Body Dressing: Total assistance;Bed level     Toilet Transfer Details (indicate cue type and reason): Not attempted. Pt with very poor sitting balance today and strong LT lateral lean. Pt also reports his RT leg is "broken" and very guarded with it. Spouse reports the RLE is not broken and expressed being unsure why pt thinks this. Pt needs 2 people for safety to attempt a stand. Toileting- Clothing Manipulation and Hygiene: Total assistance;Bed level Toileting - Clothing Manipulation Details (indicate cue type and reason): on external catheter     Functional mobility during ADLs: Maximal assistance;Total assistance;+2 for physical assistance;Cueing for sequencing;Cueing for safety       Vision Baseline Vision/History: 6 Macular Degeneration Ability to See in Adequate Light: 3 Highly impaired Patient Visual Report: Central vision impairment Additional Comments: Tends to favor LT gaze, LT head position.  Does not bring head to neutral. Pt encouraged to begain gentle trying to look over [facility] shoulder.     Perception  Pt and spouse educated on simple home adaptations, use of high contrast, and use of direct rather than overhead lighting to assist pt with vision and safety in the home.        Praxis         Pertinent Vitals/Pain Pain Assessment Pain Assessment: Faces Faces Pain Scale: Hurts even more Pain Location: Chronic back pain and RLE pain. Pain Descriptors / Indicators: Moaning, Guarding, Grimacing Pain Intervention(s): Limited activity within patient's tolerance, Monitored during session, Repositioned     Extremity/Trunk Assessment Upper Extremity Assessment Upper Extremity Assessment: Right hand dominant;RUE deficits/detail;LUE deficits/detail RUE Deficits / Details: Generalized weakness, especially to grip. Noted pt dropped pill cup from RT  hand and RN had to provide hand over hand to for pt to hold cup to mouth. Spouse endorses pt often drops things. RUE Sensation: decreased light touch RUE Coordination: decreased fine motor LUE Deficits / Details: Generalized weakness, especially to grip. LUE Sensation: decreased light touch LUE Coordination: decreased fine motor   Lower Extremity Assessment Lower Extremity Assessment: Generalized weakness   Cervical / Trunk Assessment Cervical / Trunk Assessment: Kyphotic;Other exceptions Cervical / Trunk Exceptions: Still   Communication Communication Communication: Impaired Factors Affecting Communication: Reduced clarity of speech;Hearing impaired   Cognition Arousal: Lethargic Behavior During Therapy: Anxious, Flat affect Cognition: Cognition impaired   Orientation impairments:  (Ox4) Awareness: Online awareness impaired Memory impairment (select all impairments): Working memory Attention impairment (select first level of impairment): Sustained attention Executive functioning impairment (select all impairments): Sequencing, Problem solving, Initiation                   Following commands: Impaired Following commands impaired: Follows one step commands with increased time     Cueing  General Comments   Cueing Techniques: Verbal cues;Tactile cues      Exercises     Shoulder Instructions      Home Living Family/patient expects to be discharged to:: Private residence Living Arrangements: Spouse/significant other;Children (son) Available Help at Discharge: Family;Available 24 hours/day Type of Home: House Home Access: Stairs to enter;Ramped entrance Entrance Stairs-Number of Steps:  3 Entrance Stairs-Rails: None Home Layout: One level     Bathroom Shower/Tub: Tub/shower unit;Curtain (Pt has been sink bathing for 2 months. Spouse aware of what a tub bench is.)   Bathroom Toilet: Standard Bathroom Accessibility: Yes   Home Equipment: Rollator (4  wheels);Rolling Walker (2 wheels)   Additional Comments: plan to get shower seat, can get WC      Prior Functioning/Environment Prior Level of Function : Needs assist       Physical Assist : Mobility (physical);ADLs (physical) Mobility (physical): Transfers;Gait ADLs (physical): Bathing;Dressing;IADLs Mobility Comments: used rollator at baseline, occasionally needed help to stand and ambulate a short distance. Sleeps in a regular recliner due to chronic back pain. ADLs Comments: occasionally needed assist for dressing and bathing. Spouse helps with socks/shoes. Spouse soaks his feet.    OT Problem List: Decreased strength;Decreased cognition;Obesity;Decreased safety awareness;Decreased range of motion;Decreased activity tolerance;Decreased knowledge of use of DME or AE;Impaired UE functional use;Pain;Decreased knowledge of precautions;Impaired balance (sitting and/or standing);Impaired vision/perception;Cardiopulmonary status limiting activity;Increased edema;Decreased coordination;Impaired sensation   OT Treatment/Interventions: Self-care/ADL training;Therapeutic activities;Cognitive remediation/compensation;Therapeutic exercise;Visual/perceptual remediation/compensation;Patient/family education;Energy conservation;DME and/or AE instruction;Balance training      OT Goals(Current goals can be found in the care plan section)   Acute Rehab OT Goals Patient Stated Goal: Per spouse, for pt to improve his strength so she can safely care for him at home without injury. OT Goal Formulation: With family Time For Goal Achievement: 09/01/23 Potential to Achieve Goals: Fair ADL Goals Pt Will Perform Eating: with min assist;sitting;with adaptive utensils Pt Will Perform Grooming: sitting;with set-up;with supervision (Improving EOB sitting balance to at least fair with unilatearl UE support.) Pt Will Perform Upper Body Dressing: with set-up;sitting;bed level (to demo improved sequencing and UE  functiona) Pt Will Transfer to Toilet: with min assist;stand pivot transfer Pt/caregiver will Perform Home Exercise Program: Both right and left upper extremity;With Supervision;Increased ROM;Increased strength Additional ADL Goal #1: Pt will engage in 15 min functional activities without loss of sitting balance, in order to demonstrate improved activity tolerance and balance needed to perform ADLs safely at home.   OT Frequency:  Min 1X/week    Co-evaluation              AM-PAC OT "6 Clicks" Daily Activity     Outcome Measure Help from another person eating meals?: A Lot Help from another person taking care of personal grooming?: A Little Help from another person toileting, which includes using toliet, bedpan, or urinal?: Total Help from another person bathing (including washing, rinsing, drying)?: A Lot Help from another person to put on and taking off regular upper body clothing?: A Lot Help from another person to put on and taking off regular lower body clothing?: Total 6 Click Score: 11   End of Session Nurse Communication: Mobility status;Other (comment) (RN approved OT ot see)  Activity Tolerance: Patient limited by fatigue;Patient limited by lethargy;Patient limited by pain Patient left: in bed;with call bell/phone within reach;with bed alarm set;with family/visitor present  OT Visit Diagnosis: Dizziness and giddiness (R42);Muscle weakness (generalized) (M62.81);Pain;Other symptoms and signs involving cognitive function;Low vision, both eyes (H54.2);Feeding difficulties (R63.3);Unsteadiness on feet (R26.81) Pain - Right/Left: Right Pain - part of body: Leg (and back)                Time: 5784-6962 OT Time Calculation (min): 41 min Charges:  OT General Charges $OT Visit: 1 Visit OT Evaluation $OT Eval Low Complexity: 1 Low OT Treatments $Self Care/Home Management : 8-22 mins $Therapeutic  Activity: 8-22 mins  Victorino Dike, [place] [facility] Office:  [phone] 08/18/2023   Theodoro Clock 08/18/2023, 9:46 AM

## 2023-08-18 NOTE — Progress Notes (Signed)
 Advanced Heart Failure Rounding Note  Cardiologist: Rollene Rotunda, MD   Chief Complaint: Biventricular Heart Failure  Subjective:    CO-OX 69%. Not on inotrope support.  Net + 311. Lasix gtt was stopped. Total net negative 7L  Crt 1.4->1.53  S/p successful TEE/DCCV  Very deconditioned   Objective:    Weight Range: 96 kg Body mass index is 33.15 kg/m.   Vital Signs:   Temp:  [97.6 F (36.4 C)-98.2 F (36.8 C)] 98 F (36.7 C) (03/15 0807) Pulse Rate:  [27-103] 89 (03/15 0929) Resp:  [16-36] 18 (03/15 0807) BP: (99-155)/(64-79) 138/71 (03/15 0807) SpO2:  [92 %-99 %] 92 % (03/15 0807) Weight:  [96 kg] 96 kg (03/15 0435) Last BM Date : 08/15/23 (pt claimed he had BM 2 days ago)  Weight change: Filed Weights   08/16/23 0800 08/17/23 0500 08/18/23 0435  Weight: 99.5 kg 95.3 kg 96 kg   Intake/Output:  Intake/Output Summary (Last 24 hours) at 08/18/2023 1050 Last data filed at 08/17/2023 2205 Gross per 24 hour  Intake 680.6 ml  Output 550 ml  Net 130.6 ml    Physical Exam    General:  Chronically ill appearing elderly male Neck: no JVD.  Cor: RRR, No rubs, gallops or murmurs. Lungs: clear Abdomen: soft, nontender, nondistended.  Extremities: no edema Neuro: alert & orientedx3. Affect pleasant   Telemetry   Sinus rhythm   Labs    CBC Recent Labs    08/17/23 0500 08/18/23 0500  WBC 10.6* 11.6*  HGB 12.9* 12.4*  HCT 41.0 39.2  MCV 85.8 85.4  PLT 191 203   Basic Metabolic Panel Recent Labs    16/10/96 0500 08/18/23 0500  NA 136 138  K 3.8 3.9  CL 94* 96*  CO2 29 30  GLUCOSE 180* 137*  BUN 24* 23  CREATININE 1.44* 1.53*  CALCIUM 8.3* 8.6*  MG 2.2 2.3   Liver Function Tests No results for input(s): "AST", "ALT", "ALKPHOS", "BILITOT", "PROT", "ALBUMIN" in the last 72 hours. No results for input(s): "LIPASE", "AMYLASE" in the last 72 hours. Cardiac Enzymes No results for input(s): "CKTOTAL", "CKMB", "CKMBINDEX", "TROPONINI" in the  last 72 hours.  BNP: BNP (last 3 results) Recent Labs    08/13/23 1151 08/14/23 0511  BNP 324.7* 377.8*   Fasting Lipid Panel No results for input(s): "CHOL", "HDL", "LDLCALC", "TRIG", "CHOLHDL", "LDLDIRECT" in the last 72 hours.  Imaging   ECHO TEE Result Date: 08/17/2023    TRANSESOPHOGEAL ECHO REPORT   Patient Name:   Matthew May Date of Exam: 08/17/2023 Medical Rec #:  [id number]        Height:       67.0 in Accession #:    [id number]       Weight:       210.1 lb Date of Birth:  Dec 14, 1945         BSA:          2.065 m Patient Age:    78 years         BP:           136/79 mmHg Patient Gender: M                HR:           120 bpm. Exam Location:  Inpatient Procedure: Transesophageal Echo, Cardiac Doppler and Color Doppler (Both            Spectral and Color Flow Doppler were utilized during procedure).  Indications:     I48.92* Unspecified atrial flutter  History:         Patient has prior history of Echocardiogram examinations, most                  recent 08/14/2023. CHF, CAD, Abnormal ECG and Prior CABG, COPD,                  Aortic Valve Disease, Arrythmias:Atrial Flutter,                  Signs/Symptoms:Chest Pain; Risk Factors:Diabetes. TAVR.                  Aortic Valve: unknown Edwards Sapien prosthetic, stented (TAVR)                  valve is present in the aortic position.  Sonographer:     Sheralyn Boatman RDCS Referring Phys:  [id number] [place] LEE Diagnosing Phys: Wilfred Lacy PROCEDURE: After discussion of the risks and benefits of a TEE, an informed consent was obtained from the patient. The transesophogeal probe was passed without difficulty through the esophogus of the patient. Imaged were obtained with the patient in a left lateral decubitus position. Local oropharyngeal anesthetic was provided with Cetacaine. Sedation performed by different physician. The patient was monitored while under deep sedation. Anesthestetic sedation was provided intravenously by Anesthesiology: 67mg  of  Propofol, 100mg  of Lidocaine. The patient developed Respiratory depression during the procedure. A successful direct current cardioversion was performed at 360 joules with 1 attempt.  IMPRESSIONS  1. Left ventricular ejection fraction, by estimation, is 35 to 40%. The left ventricle has moderately decreased function. The left ventricle demonstrates regional wall motion abnormalities with mid-apical septal and apical akinesis. There is mild concentric left ventricular hypertrophy.  2. Peak RV-RA gradient 19 mmHg. Right ventricular systolic function is moderately reduced. The right ventricular size is normal.  3. Left atrial size was mildly dilated. No left atrial/left atrial appendage thrombus was detected.  4. Right atrial size was mildly dilated.  5. No PFO or ASD by color doppler.  6. The mitral valve is normal in structure. Trivial mitral valve regurgitation. No evidence of mitral stenosis.  7. Bioprosthetic aortic valve s/p TAVR. No significant peri-valvular leakage. No significant stenosis. FINDINGS  Left Ventricle: Left ventricular ejection fraction, by estimation, is 35 to 40%. The left ventricle has moderately decreased function. The left ventricle demonstrates regional wall motion abnormalities. The left ventricular internal cavity size was small. There is mild concentric left ventricular hypertrophy. Right Ventricle: Peak RV-RA gradient 19 mmHg. The right ventricular size is normal. No increase in right ventricular wall thickness. Right ventricular systolic function is moderately reduced. Left Atrium: Left atrial size was mildly dilated. No left atrial/left atrial appendage thrombus was detected. Right Atrium: Right atrial size was mildly dilated. Pericardium: There is no evidence of pericardial effusion. Mitral Valve: The mitral valve is normal in structure. Trivial mitral valve regurgitation. No evidence of mitral valve stenosis. Tricuspid Valve: The tricuspid valve is normal in structure. Tricuspid  valve regurgitation is trivial. No evidence of tricuspid stenosis. Aortic Valve: Bioprosthetic aortic valve s/p TAVR. No significant peri-valvular leakage. No significant stenosis. The aortic valve has been repaired/replaced. Aortic valve regurgitation is not visualized. No aortic stenosis is present. Aortic valve mean gradient measures 1.0 mmHg. Aortic valve peak gradient measures 2.1 mmHg. There is a unknown Edwards Sapien prosthetic, stented (TAVR) valve present in the aortic position. Echo findings are consistent with normal  structure and function of the aortic valve prosthesis. Pulmonic Valve: The pulmonic valve was normal in structure. Pulmonic valve regurgitation is not visualized. No evidence of pulmonic stenosis. Aorta: The aortic root and ascending aorta are structurally normal, with no evidence of dilitation. IAS/Shunts: No PFO or ASD by color doppler. Additional Comments: Spectral Doppler performed. AORTIC VALVE AV Vmax:      72.10 cm/s AV Vmean:     38.500 cm/s AV VTI:       0.108 m AV Peak Grad: 2.1 mmHg AV Mean Grad: 1.0 mmHg  AORTA Ao Asc diam: 3.60 cm Dalton McleanMD Electronically signed by Wilfred Lacy Signature Date/Time: 08/17/2023/6:11:51 PM    Final    Medications:    Scheduled Medications:  Chlorhexidine Gluconate Cloth  6 each Topical Daily   clopidogrel  75 mg Oral Daily   dapagliflozin propanediol  10 mg Oral Daily   digoxin  0.125 mg Oral Daily   feeding supplement  237 mL Oral BID BM   fluticasone furoate-vilanterol  1 puff Inhalation Daily   And   umeclidinium bromide  1 puff Inhalation Daily   gabapentin  100 mg Oral TID   glimepiride  4 mg Oral Q breakfast   insulin aspart  0-9 Units Subcutaneous TID WC   levothyroxine  150 mcg Oral Q0600   loratadine  10 mg Oral Daily   losartan  25 mg Oral Daily   mupirocin ointment   Nasal BID   polyethylene glycol  17 g Oral BID   potassium chloride SA  20 mEq Oral Daily   QUEtiapine  25 mg Oral QHS   rosuvastatin  40 mg  Oral Daily   senna  1 tablet Oral QHS   sodium chloride flush  10-40 mL Intracatheter Q12H   sodium chloride flush  3 mL Intravenous Q12H   spironolactone  12.5 mg Oral Daily   Vitamin D (Ergocalciferol)  50,000 Units Oral Weekly    Infusions:  amiodarone 30 mg/hr (08/17/23 2334)   heparin 1,650 Units/hr (08/18/23 0437)    PRN Medications: acetaminophen, acetaminophen, acetaminophen, albuterol, nitroGLYCERIN, ondansetron (ZOFRAN) IV, ondansetron (ZOFRAN) IV, sodium chloride flush, sodium chloride flush  Patient Profile   Matthew May is a 78 year old white male with CAD status post CABG in 2011, severe aortic stenosis status post TAVR in 2021, COPD, hyperlipidemia, type 2 diabetes and paroxysmal atrial fibrillation/flutter currently admitted with concerns for cardiogenic shock.   Assessment/Plan   Acute Biventricular HFrEF with  CAD ; not in shock - suspected completed infarct. Suspect mixed etiology with CAD and tachy mediated.  - Echo with EF down to 20-25% from previous 70-75% - [facility] 03/13: Patent sequential SVG-PLV/PDA, RCA known to be occluded, SVG to OM known to be occluded, p LCX occluded, LIMA to LAD w/ 90% stenosis at touchdown to distal LAD (likely culprit, small, not amenable to PCI), upper normal filling pressures with CI 1.97 - CO-OX is normal. CVP 8. Start po lasix today - cont losartan 25 mg daily. - BB on hold; can reconsider starting tomorrow, Co. ox is normal - on Farxiga 10 mg daily - continue spiro 12.5 mg daily - continue digoxin 0.125 mg daily.     CAD - H/O CABG 2011 LIMA to LAD, SVG to OM and sequential SVG to PDA and PL [place] - Chest pain started a few weeks ago. Suspected completed infart. HS Trop 6440>3474 - LHC as above. Suspect culprit was 90% stenosis LIMA to LAD at touchdown to distal LAD. Med management  recommended - no recurrent chest pain - aspirin switched to plavix d/t NSTEMI. Continue crestor.  - Holding bb was held 2/2 teetering on shock.  Can consider low dose tomorrow  Acute Respiratory Distress - CXR w pulm edema  - Improved with diuresis. Now on RA.   A flutter/Afib with RVR -EKG at PCP in [place] looked like A fib RVR. He declined further work up per his wife. Atrial Flutter RVR on admit. Currently Afib 110s-120s  - Change amio to oral [amiodarone 400 mg twice daily for 10 days (~8 gram load) then can transition to amiodarone 200 mg daily] - s/p successful TEE/DCCV 3/14 - changed heparin to eliquis  - TSH okay  AKI - cardiorenal; improving with decongestion - creatinine stable today 1.5  H/o TAVR - Normal valve function on echo this admission.    Will need aggressive PT/OT  Code status: DNR  [name], MD  08/18/2023, 10:50 AM  Carolan Clines E 08/18/2023 10:50 AM

## 2023-08-19 DIAGNOSIS — I200 Unstable angina: Secondary | ICD-10-CM | POA: Diagnosis not present

## 2023-08-19 LAB — CBC
HCT: 38.1 % — ABNORMAL LOW (ref 39.0–52.0)
Hemoglobin: 11.8 g/dL — ABNORMAL LOW (ref 13.0–17.0)
MCH: 26.9 pg (ref 26.0–34.0)
MCHC: 31 g/dL (ref 30.0–36.0)
MCV: 87 fL (ref 80.0–100.0)
Platelets: 201 10*3/uL (ref 150–400)
RBC: 4.38 MIL/uL (ref 4.22–5.81)
RDW: 14.2 % (ref 11.5–15.5)
WBC: 10.1 10*3/uL (ref 4.0–10.5)
nRBC: 0 % (ref 0.0–0.2)

## 2023-08-19 LAB — MAGNESIUM: Magnesium: 2.5 mg/dL — ABNORMAL HIGH (ref 1.7–2.4)

## 2023-08-19 LAB — BASIC METABOLIC PANEL
Anion gap: 11 (ref 5–15)
BUN: 24 mg/dL — ABNORMAL HIGH (ref 8–23)
CO2: 29 mmol/L (ref 22–32)
Calcium: 8.5 mg/dL — ABNORMAL LOW (ref 8.9–10.3)
Chloride: 96 mmol/L — ABNORMAL LOW (ref 98–111)
Creatinine, Ser: 1.52 mg/dL — ABNORMAL HIGH (ref 0.61–1.24)
GFR, Estimated: 47 mL/min — ABNORMAL LOW (ref >60)
Glucose, Bld: 144 mg/dL — ABNORMAL HIGH (ref 70–99)
Potassium: 4.2 mmol/L (ref 3.5–5.1)
Sodium: 136 mmol/L (ref 135–145)

## 2023-08-19 LAB — GLUCOSE, CAPILLARY
Glucose-Capillary: 144 mg/dL — ABNORMAL HIGH (ref 70–99)
Glucose-Capillary: 180 mg/dL — ABNORMAL HIGH (ref 70–99)
Glucose-Capillary: 201 mg/dL — ABNORMAL HIGH (ref 70–99)
Glucose-Capillary: 97 mg/dL (ref 70–99)

## 2023-08-19 LAB — COOXEMETRY PANEL
Carboxyhemoglobin: 2 % — ABNORMAL HIGH (ref 0.5–1.5)
Methemoglobin: <0.7 % (ref 0.0–1.5)
O2 Saturation: 72.6 %
Total hemoglobin: 12.2 g/dL (ref 12.0–16.0)

## 2023-08-19 NOTE — Plan of Care (Signed)
 Patient doing well today.  Sat on edge of bed.  Still very weak and could not keep himself erect without help.  Vitals remained stable and patient alert and oriented.

## 2023-08-19 NOTE — Progress Notes (Signed)
 Advanced Heart Failure Rounding Note  Cardiologist: Matthew Rotunda, MD   Chief Complaint: Biventricular Heart Failure  Subjective:   3/15 CO-OX 69%. Not on inotrope support. Net + 311. Lasix gtt was stopped. Total net negative 7L Crt 1.4->1.53 S/p successful TEE/DCCV Very deconditioned  3/16 Co-ox 72 improved Crt stable He ate breakfast. Conditioning mildly improved   Objective:    Weight Range: 96.3 kg Body mass index is 33.25 kg/m.   Vital Signs:   Temp:  [97.5 F (36.4 C)-98.6 F (37 C)] 97.5 F (36.4 C) (03/16 0735) Pulse Rate:  [67-91] 75 (03/16 0735) Resp:  [15-20] 17 (03/16 0735) BP: (95-131)/(46-67) 124/67 (03/16 0735) SpO2:  [90 %-97 %] 95 % (03/16 0745) Weight:  [96.3 kg] 96.3 kg (03/16 0513) Last BM Date : 08/17/23  Weight change: Filed Weights   08/17/23 0500 08/18/23 0435 08/19/23 0513  Weight: 95.3 kg 96 kg 96.3 kg   Intake/Output:  Intake/Output Summary (Last 24 hours) at 08/19/2023 1050 Last data filed at 08/19/2023 0900 Gross per 24 hour  Intake 600 ml  Output 900 ml  Net -300 ml    Physical Exam    General:  Chronically ill appearing man, resting Neck: no JVD.  Cor: RRR, . No rubs, gallops or murmurs. Lungs: clear Abdomen: soft, nontender, nondistended.  Extremities: no edema Neuro: alert & orientedx3. Affect pleasant   Telemetry   Sinus rhythm   Labs    CBC Recent Labs    08/18/23 0500 08/19/23 0504  WBC 11.6* 10.1  HGB 12.4* 11.8*  HCT 39.2 38.1*  MCV 85.4 87.0  PLT 203 201   Basic Metabolic Panel Recent Labs    40/98/11 0500 08/19/23 0504  NA 138 136  K 3.9 4.2  CL 96* 96*  CO2 30 29  GLUCOSE 137* 144*  BUN 23 24*  CREATININE 1.53* 1.52*  CALCIUM 8.6* 8.5*  MG 2.3 2.5*   Liver Function Tests No results for input(s): "AST", "ALT", "ALKPHOS", "BILITOT", "PROT", "ALBUMIN" in the last 72 hours. No results for input(s): "LIPASE", "AMYLASE" in the last 72 hours. Cardiac Enzymes No results for  input(s): "CKTOTAL", "CKMB", "CKMBINDEX", "TROPONINI" in the last 72 hours.  BNP: BNP (last 3 results) Recent Labs    08/13/23 1151 08/14/23 0511  BNP 324.7* 377.8*   Fasting Lipid Panel No results for input(s): "CHOL", "HDL", "LDLCALC", "TRIG", "CHOLHDL", "LDLDIRECT" in the last 72 hours.  Imaging   No results found.  Medications:    Scheduled Medications:  amiodarone  400 mg Oral BID   apixaban  5 mg Oral BID   Chlorhexidine Gluconate Cloth  6 each Topical Daily   clopidogrel  75 mg Oral Daily   dapagliflozin propanediol  10 mg Oral Daily   digoxin  0.125 mg Oral Daily   feeding supplement  237 mL Oral BID BM   fluticasone furoate-vilanterol  1 puff Inhalation Daily   And   umeclidinium bromide  1 puff Inhalation Daily   furosemide  40 mg Oral Daily   gabapentin  100 mg Oral TID   glimepiride  4 mg Oral Q breakfast   insulin aspart  0-9 Units Subcutaneous TID WC   levothyroxine  150 mcg Oral Q0600   loratadine  10 mg Oral Daily   losartan  25 mg Oral Daily   mupirocin ointment   Nasal BID   polyethylene glycol  17 g Oral BID   potassium chloride SA  20 mEq Oral Daily   QUEtiapine  25 mg Oral QHS   rosuvastatin  40 mg Oral Daily   senna  1 tablet Oral QHS   sodium chloride flush  10-40 mL Intracatheter Q12H   sodium chloride flush  3 mL Intravenous Q12H   spironolactone  12.5 mg Oral Daily   Vitamin D (Ergocalciferol)  50,000 Units Oral Weekly    Infusions:    PRN Medications: acetaminophen, acetaminophen, acetaminophen, albuterol, nitroGLYCERIN, ondansetron (ZOFRAN) IV, ondansetron (ZOFRAN) IV, sodium chloride flush, sodium chloride flush  Patient Profile   Matthew May is a 78 year old white male with CAD status post CABG in 2011, severe aortic stenosis status post TAVR in 2021, COPD, hyperlipidemia, type 2 diabetes and paroxysmal atrial fibrillation/flutter currently admitted with concerns for cardiogenic shock.   Assessment/Plan   Acute Biventricular  HFrEF with  CAD ; not in shock - suspected completed infarct. Suspect mixed etiology with CAD and tachy mediated.  - Echo with EF down to 20-25% from previous 70-75% - [facility] 03/13: Patent sequential SVG-PLV/PDA, RCA known to be occluded, SVG to OM known to be occluded, p LCX occluded, LIMA to LAD w/ 90% stenosis at touchdown to distal LAD (likely culprit, small, not amenable to PCI), upper normal filling pressures with CI 1.97 - CO-OX is normal. CVP mild. Started po lasix  - cont losartan 25 mg daily. - BB on hold has low Bps at times; can reconsider starting tomorrow, Co. ox is normal - on Farxiga 10 mg daily - continue spiro 12.5 mg daily - continue digoxin 0.125 mg daily.     CAD - H/O CABG 2011 LIMA to LAD, SVG to OM and sequential SVG to PDA and PL Matthew May - Chest pain started a few weeks ago. Suspected completed infart. HS Trop [id number] - LHC as above. Suspect culprit was 90% stenosis LIMA to LAD at touchdown to distal LAD. Med management recommended - no recurrent chest pain - aspirin switched to plavix d/t NSTEMI. Continue crestor.  - Holding bb per above  Acute Respiratory Distress - CXR w pulm edema  - Improved with diuresis. Now on 1L   A flutter/Afib with RVR -EKG at PCP in [place] looked like A fib RVR. He declined further work up per his wife. Atrial Flutter RVR on admit. Currently Afib 110s-120s  - Changed amio to oral [amiodarone 400 mg twice daily for 10 days (~8 gram load) then can transition to amiodarone 200 mg daily] - s/p successful TEE/DCCV 3/14; continues in sinus rhythm - changed heparin to eliquis  - TSH okay  AKI - cardiorenal; improving with decongestion - creatinine stable today 1.5  H/o TAVR - Normal valve function on echo this admission.    Will need aggressive PT/OT  Code status: DNR  Matthew Fus, MD  08/19/2023, 10:50 AM

## 2023-08-19 NOTE — Plan of Care (Signed)
  Problem: Education: Goal: Understanding of cardiac disease, CV risk reduction, and recovery process will improve Outcome: Progressing   Problem: Education: Goal: Knowledge of General Education information will improve Description: Including pain rating scale, medication(s)/side effects and non-pharmacologic comfort measures Outcome: Progressing   Problem: Health Behavior/Discharge Planning: Goal: Ability to manage health-related needs will improve Outcome: Progressing   Problem: Clinical Measurements: Goal: Ability to maintain clinical measurements within normal limits will improve Outcome: Progressing

## 2023-08-20 ENCOUNTER — Inpatient Hospital Stay (HOSPITAL_COMMUNITY)

## 2023-08-20 ENCOUNTER — Encounter (HOSPITAL_COMMUNITY): Payer: Self-pay | Admitting: Cardiology

## 2023-08-20 DIAGNOSIS — I200 Unstable angina: Secondary | ICD-10-CM | POA: Diagnosis not present

## 2023-08-20 LAB — URINALYSIS, ROUTINE W REFLEX MICROSCOPIC
Bilirubin Urine: NEGATIVE
Glucose, UA: >=500 mg/dL — AB
Hgb urine dipstick: NEGATIVE
Ketones, ur: NEGATIVE mg/dL
Leukocytes,Ua: NEGATIVE
Nitrite: NEGATIVE
Protein, ur: NEGATIVE mg/dL
Specific Gravity, Urine: 1.016 (ref 1.005–1.030)
pH: 5 (ref 5.0–8.0)

## 2023-08-20 LAB — BASIC METABOLIC PANEL
Anion gap: 12 (ref 5–15)
BUN: 21 mg/dL (ref 8–23)
CO2: 27 mmol/L (ref 22–32)
Calcium: 8.4 mg/dL — ABNORMAL LOW (ref 8.9–10.3)
Chloride: 96 mmol/L — ABNORMAL LOW (ref 98–111)
Creatinine, Ser: 1.32 mg/dL — ABNORMAL HIGH (ref 0.61–1.24)
GFR, Estimated: 56 mL/min — ABNORMAL LOW (ref >60)
Glucose, Bld: 100 mg/dL — ABNORMAL HIGH (ref 70–99)
Potassium: 4.5 mmol/L (ref 3.5–5.1)
Sodium: 135 mmol/L (ref 135–145)

## 2023-08-20 LAB — CBC WITH DIFFERENTIAL/PLATELET
Abs Immature Granulocytes: 0 10*3/uL (ref 0.00–0.07)
Basophils Absolute: 0 10*3/uL (ref 0.0–0.1)
Basophils Relative: 0 %
Eosinophils Absolute: 0 10*3/uL (ref 0.0–0.5)
Eosinophils Relative: 0 %
HCT: 40.6 % (ref 39.0–52.0)
Hemoglobin: 12.6 g/dL — ABNORMAL LOW (ref 13.0–17.0)
Lymphocytes Relative: 2 %
Lymphs Abs: 0.6 10*3/uL — ABNORMAL LOW (ref 0.7–4.0)
MCH: 27 pg (ref 26.0–34.0)
MCHC: 31 g/dL (ref 30.0–36.0)
MCV: 86.9 fL (ref 80.0–100.0)
Monocytes Absolute: 1.2 10*3/uL — ABNORMAL HIGH (ref 0.1–1.0)
Monocytes Relative: 4 %
Neutro Abs: 29.3 10*3/uL — ABNORMAL HIGH (ref 1.7–7.7)
Neutrophils Relative %: 94 %
Platelets: 250 10*3/uL (ref 150–400)
RBC: 4.67 MIL/uL (ref 4.22–5.81)
RDW: 14.4 % (ref 11.5–15.5)
WBC: 31.2 10*3/uL — ABNORMAL HIGH (ref 4.0–10.5)
nRBC: 0 % (ref 0.0–0.2)
nRBC: 0 /100{WBCs}

## 2023-08-20 LAB — BLOOD GAS, ARTERIAL
Acid-Base Excess: 5.2 mmol/L — ABNORMAL HIGH (ref 0.0–2.0)
Bicarbonate: 29 mmol/L — ABNORMAL HIGH (ref 20.0–28.0)
Drawn by: 548791
O2 Saturation: 93.1 %
Patient temperature: 37.6
pCO2 arterial: 40 mmHg (ref 32–48)
pH, Arterial: 7.47 — ABNORMAL HIGH (ref 7.35–7.45)
pO2, Arterial: 64 mmHg — ABNORMAL LOW (ref 83–108)

## 2023-08-20 LAB — COOXEMETRY PANEL
Carboxyhemoglobin: 2 % — ABNORMAL HIGH (ref 0.5–1.5)
Methemoglobin: <0.7 % (ref 0.0–1.5)
O2 Saturation: 73 %
Total hemoglobin: 13 g/dL (ref 12.0–16.0)

## 2023-08-20 LAB — AMMONIA: Ammonia: <10 umol/L (ref 9–35)

## 2023-08-20 LAB — BLOOD GAS, VENOUS
Acid-base deficit: 1.1 mmol/L (ref 0.0–2.0)
Bicarbonate: 23.5 mmol/L (ref 20.0–28.0)
O2 Saturation: 83.1 %
Patient temperature: 37.6
pCO2, Ven: 39 mmHg — ABNORMAL LOW (ref 44–60)
pH, Ven: 7.39 (ref 7.25–7.43)
pO2, Ven: 53 mmHg — ABNORMAL HIGH (ref 32–45)

## 2023-08-20 LAB — GLUCOSE, CAPILLARY
Glucose-Capillary: 100 mg/dL — ABNORMAL HIGH (ref 70–99)
Glucose-Capillary: 132 mg/dL — ABNORMAL HIGH (ref 70–99)
Glucose-Capillary: 107 mg/dL — ABNORMAL HIGH (ref 70–99)
Glucose-Capillary: 123 mg/dL — ABNORMAL HIGH (ref 70–99)

## 2023-08-20 LAB — MAGNESIUM: Magnesium: 2.1 mg/dL (ref 1.7–2.4)

## 2023-08-20 MED ORDER — SPIRONOLACTONE 25 MG PO TABS
25.0000 mg | ORAL_TABLET | Freq: Every day | ORAL | Status: DC
  Filled 2023-08-20: qty 1

## 2023-08-20 MED ORDER — TORSEMIDE 20 MG PO TABS
40.0000 mg | ORAL_TABLET | Freq: Every day | ORAL | Status: DC
  Filled 2023-08-20: qty 2

## 2023-08-20 MED ORDER — CARVEDILOL 3.125 MG PO TABS
3.1250 mg | ORAL_TABLET | Freq: Two times a day (BID) | ORAL | Status: DC
  Administered 2023-08-20 (×2): 3.125 mg via ORAL
  Filled 2023-08-20 (×3): qty 1

## 2023-08-20 NOTE — Progress Notes (Addendum)
 Advanced Heart Failure Rounding Note  Cardiologist: Rollene Rotunda, MD  Chief Complaint: Biventricular Heart Failure Subjective:    Co-ox 73%. Not on inotrope support. Net negative 1L. CVP 9, up from 5 yesterday.  Breathing appears labored.   Somewhat drowsy and confused. Denies SOB, pain, CP or dizziness. Needs to mobilize.  Objective:    Weight Range: 95.6 kg Body mass index is 33.01 kg/m.   Vital Signs:   Temp:  [97.6 F (36.4 C)-99.9 F (37.7 C)] 99.9 F (37.7 C) (03/17 0809) Pulse Rate:  [74-102] 102 (03/17 0847) Resp:  [20-26] 24 (03/17 0809) BP: (93-138)/(45-69) 138/63 (03/17 0809) SpO2:  [93 %-98 %] 93 % (03/17 0809) Weight:  [95.6 kg] 95.6 kg (03/17 0639) Last BM Date : 08/17/23  Weight change: Filed Weights   08/18/23 0435 08/19/23 0513 08/20/23 0639  Weight: 96 kg 96.3 kg 95.6 kg   Intake/Output:  Intake/Output Summary (Last 24 hours) at 08/20/2023 0902 Last data filed at 08/20/2023 0639 Gross per 24 hour  Intake 270 ml  Output 1325 ml  Net -1055 ml    Physical Exam    CVP 9 General: Haggard appearing. Breathing labored Cardiac: JVP ~10cm. S1 and S2 present. No murmurs or rub. Extremities: Warm and dry. Trace edema.  Neuro: Disoriented this morning. Answering one word questions. Lines/Devices:  RUE PICC  Telemetry   SR 90s (personally reviewed)  Labs    CBC Recent Labs    08/18/23 0500 08/19/23 0504  WBC 11.6* 10.1  HGB 12.4* 11.8*  HCT 39.2 38.1*  MCV 85.4 87.0  PLT 203 201   Basic Metabolic Panel Recent Labs    19/14/78 0504 08/20/23 0500  NA 136 135  K 4.2 4.5  CL 96* 96*  CO2 29 27  GLUCOSE 144* 100*  BUN 24* 21  CREATININE 1.52* 1.32*  CALCIUM 8.5* 8.4*  MG 2.5* 2.1   Liver Function Tests No results for input(s): "AST", "ALT", "ALKPHOS", "BILITOT", "PROT", "ALBUMIN" in the last 72 hours. No results for input(s): "LIPASE", "AMYLASE" in the last 72 hours. Cardiac Enzymes No results for input(s): "CKTOTAL",  "CKMB", "CKMBINDEX", "TROPONINI" in the last 72 hours.  BNP: BNP (last 3 results) Recent Labs    08/13/23 1151 08/14/23 0511  BNP 324.7* 377.8*   Fasting Lipid Panel No results for input(s): "CHOL", "HDL", "LDLCALC", "TRIG", "CHOLHDL", "LDLDIRECT" in the last 72 hours.  Imaging   No results found.  Medications:    Scheduled Medications:  amiodarone  400 mg Oral BID   apixaban  5 mg Oral BID   Chlorhexidine Gluconate Cloth  6 each Topical Daily   clopidogrel  75 mg Oral Daily   dapagliflozin propanediol  10 mg Oral Daily   digoxin  0.125 mg Oral Daily   feeding supplement  237 mL Oral BID BM   fluticasone furoate-vilanterol  1 puff Inhalation Daily   And   umeclidinium bromide  1 puff Inhalation Daily   furosemide  40 mg Oral Daily   gabapentin  100 mg Oral TID   glimepiride  4 mg Oral Q breakfast   insulin aspart  0-9 Units Subcutaneous TID WC   levothyroxine  150 mcg Oral Q0600   loratadine  10 mg Oral Daily   losartan  25 mg Oral Daily   mupirocin ointment   Nasal BID   polyethylene glycol  17 g Oral BID   potassium chloride SA  20 mEq Oral Daily   QUEtiapine  25 mg Oral QHS  rosuvastatin  40 mg Oral Daily   senna  1 tablet Oral QHS   sodium chloride flush  10-40 mL Intracatheter Q12H   sodium chloride flush  3 mL Intravenous Q12H   spironolactone  12.5 mg Oral Daily   Vitamin D (Ergocalciferol)  50,000 Units Oral Weekly   Infusions:  PRN Medications: acetaminophen, acetaminophen, acetaminophen, albuterol, nitroGLYCERIN, ondansetron (ZOFRAN) IV, ondansetron (ZOFRAN) IV, sodium chloride flush, sodium chloride flush  Patient Profile   Matthew May is a 78 year old white male with CAD status post CABG in 2011, severe aortic stenosis status post TAVR in 2021, COPD, hyperlipidemia, type 2 diabetes and paroxysmal atrial fibrillation/flutter currently admitted with concerns for cardiogenic shock.   Assessment/Plan   Acute Biventricular HFrEF CAD  - Suspect  mixed etiology with CAD and tachy mediated.  - Echo with EF down to 20-25% from previous 70-75% - [facility] 03/13: Patent sequential SVG-PLV/PDA, RCA known to be occluded, SVG to OM known to be occluded, p LCX occluded, LIMA to LAD w/ 90% stenosis at touchdown to distal LAD (likely culprit, small, not amenable to PCI), upper normal filling pressures with CI 1.97 - Co-ox 73. CVP 9. Continue Lasix 40 mg daily. Will switch to Torsemide tomorrow. - BP up, but lower at night. Received losartan this morning. Can likely switch to [facility] tomorrow if BP remains up overnight. - start Coreg 3.125 mg bid - continue Farxiga 10 mg daily - continue spiro 12.5 mg daily - continue digoxin 0.125 mg daily.   CAD - h/o CABG 2011 LIMA to LAD, SVG to OM and sequential SVG to PDA and PL branch - Chest pain started a few weeks ago. Suspected completed infart. HS Trop 0160>1093 - LHC as above. Suspect culprit was 90% stenosis LIMA to LAD at touchdown to distal LAD. Med management recommended - no recurrent chest pain - Continue plavix + crestor. No ASA with Eliquis - Holding bb due to possible shock.  Acute Respiratory Distress - CXR w pulm edema  - Improved with diuresis. Now on RA. - denies SOB, however appears labored. Check VBG. Physical exam consistent with hypercarbia vs. delerium   A flutter/Afib with RVR - A Flutter RVR on admit. - s/p TEE/DCCV. Remains in NSR - continue eliquis - continue amio 400 mg bid  AKI - cardiorenal; improving with decongestion - creatinine stable today 1.32  H/o TAVR - Normal valve function on echo this admission.    Concerned with deconditioning. Aggressive PT/OT. Needs to keep a consistent schedule with delirium.   Code status: DNR  Length of Stay: 7  Matthew Lee, Matthew May  08/20/2023, 9:02 AM  Advanced Heart Failure Team Pager [phone] (M-F; 7a - 5p)  Please contact [facility] for night-coverage after hours (5p -7a ) and weekends on [email or web address]  Patient seen with  Matthew May, I formulated the plan and agree with the above note.   He is delirious/confused this morning.  Very weak.  Able to sit on edge of bed.  Daughter thinks delirium is worsening.    CVP 9, co-ox 73%.  He remains in NSR.  Creatinine trending down, 1.32 today.  BP stable.   General: NAD Neck: No JVD, no thyromegaly or thyroid nodule.  Lungs: Clear to auscultation bilaterally with normal respiratory effort. CV: Nondisplaced PMI.  Heart regular S1/S2, no S3/S4, no murmur.  No peripheral edema. Abdomen: Soft, nontender, no hepatosplenomegaly, no distention.  Skin: Intact without lesions or rashes.  Neurologic: Alert but confused.  Psych: Normal affect. Extremities: No clubbing or  cyanosis.  HEENT: Normal.   Per daughter, patient seems more weak and confused today.  Workup for delirium as below.  - CT head w/o contrast.  - NH3 level - ABG - UA, CXR, CBC with differential.   He remains in NSR on amiodarone and apixaban.   BP stable, CVP 9 though not volume overloaded on exam.  - Lasix 40 mg po received today but will transition to torsemide 40 mg daily tomorrow.  - Increase spironolactone to 25 mg daily and can start Coreg as above.   He is profoundly deconditioned.  Will need rehab stay.   Matthew May 08/20/2023 11:56 AM

## 2023-08-20 NOTE — Progress Notes (Signed)
 ABG sample obtained x1 attempt on room air. Lab notified of sample being sent down at this time. Pt placed on 4L nasal cannula after sample obtained. RT will continue to monitor and be available as needed.

## 2023-08-20 NOTE — TOC Progression Note (Signed)
 Transition of Care [facility]) - Progression Note    Patient Details  Name: Matthew May MRN: [id number] Date of Birth: 1945/07/22  [facility]) CM/SW Contact  Reva Bores, LCSWA Phone Number:[phone] 08/20/2023, 3:03 PM  Clinical Narrative: 2:41 PM- HF CSW attempted to meet with pt at bedside. Pt was sleeping. CSW will follow up with pt at a more appropriate time.   2:58 PM- HF CSW called and spoke with the pts wife over the phone. Pts wife stated that she was under the impression that the pt was coming home to complete HHPT services. Pts wife stated that she is "not declining SNF, but it is not her first option." Pts wife stated that she would prefer to bring the pt home. Pts wife stated that her adult son lives at home and come help. Pts wife also stated that they have other family who can assist around the clock.   HF CSW notified HF NCM via secure chat and shared updates.   TOC will continue following.        Expected Discharge Plan: Home w Home Health Services Barriers to Discharge: Continued Medical Work up  Expected Discharge Plan and Services   Discharge Planning Services: CM Consult Post Acute Care Choice: Home Health Living arrangements for the past 2 months: Single Family Home                                       Social Determinants of Health (SDOH) Interventions SDOH Screenings   Food Insecurity: No Food Insecurity (08/13/2023)  Housing: Low Risk  (08/13/2023)  Transportation Needs: No Transportation Needs (08/13/2023)  Utilities: Not At Risk (08/13/2023)  Alcohol Screen: Low Risk  (08/25/2022)  Depression (PHQ2-9): Low Risk  (10/26/2022)  Financial Resource Strain: Low Risk  (08/25/2022)  Physical Activity: Insufficiently Active (08/25/2022)  Social Connections: Moderately Isolated (08/13/2023)  Stress: No Stress Concern Present (08/25/2022)  Tobacco Use: Medium Risk (08/14/2023)    Readmission Risk Interventions     No data to display

## 2023-08-20 NOTE — Care Management Important Message (Signed)
 Important Message  Patient Details  Name: Matthew May MRN: [id number] Date of Birth: 08/12/1945   Important Message Given:  Yes - Medicare IM     Renie Ora 08/20/2023, 10:25 AM

## 2023-08-20 NOTE — Progress Notes (Signed)
 RN made an hourly round when patient claimed he is hurting across his chest. RN assessed patient's pain and he said "this pain is chronic". RN offered tylenol but he declined.  RN stayed with the patient until he said his pain is now decreasing. MD notified. Call bell placed within patient's reach. Will continue to monitor.

## 2023-08-20 NOTE — Progress Notes (Addendum)
 Physical Therapy Treatment Patient Details Name: Matthew May MRN: [id number] DOB: September 17, 1945 Today's Date: 08/20/2023   History of Present Illness 78 yo male admitted 3/10 with chest pain and cardiogenic shock, NSTEMI. 3/13 RHC/LHC. 3/14 s/p TEE. PMhx: CAD, CABG, DM, PAF, AS s/p TAVR, COPD, HTN, HLD    PT Comments  Pt with increased confusion since last treatment. Daughter reports pt seems more confused and she has feels his RUE is weaker. During session pt is globally weak and difficult to tell if RUE is weaker. MD present and to order head CT. Worked on sitting EOB with pt and he fatigued quickly and also with some BP decr. (136/67 in supine and 104/60 in sitting). Sitting balance began worsening and returned pt to supine. Unsure if this was from orthostasis or fatigue or both. Patient will benefit from continued inpatient follow up therapy, <3 hours/day.     If plan is discharge home, recommend the following: A lot of help with bathing/dressing/bathroom;Assistance with cooking/housework;Assist for transportation;Help with stairs or ramp for entrance;Two people to help with walking and/or transfers   Can travel by private vehicle     No  Equipment Recommendations  Other (comment) (To be determined at next venue)    Recommendations for Other Services       Precautions / Restrictions Precautions Precautions: Fall;Other (comment) Recall of Precautions/Restrictions: Impaired Precaution/Restrictions Comments: LHC on 3/13, no push/pull/lift >5# Restrictions Weight Bearing Restrictions Per Provider Order: No     Mobility  Bed Mobility Overal bed mobility: Needs Assistance Bed Mobility: Supine to Sit, Sit to Supine     Supine to sit: Max assist Sit to supine: Total assist   General bed mobility comments: Assist to bring legs off of bed, elevate trunk into sitting, and bring hips to EOB    Transfers                   General transfer comment: Unable to safely attempt  due to weakness, lethargy, and poor balance.    Ambulation/Gait               General Gait Details: Unable   Stairs             Wheelchair Mobility     Tilt Bed    Modified Rankin (Stroke Patients Only)       Balance Overall balance assessment: Needs assistance, Mild deficits observed, not formally tested Sitting-balance support: Feet supported, Bilateral upper extremity supported Sitting balance-Leahy Scale: Poor Sitting balance - Comments: min assist to maintain static sitting. Postural control: Other (comment), Left lateral lean (anterior lean)                                  Communication Communication Communication: Impaired Factors Affecting Communication: Reduced clarity of speech;Hearing impaired  Cognition Arousal: Lethargic Behavior During Therapy: Flat affect   PT - Cognitive impairments: Sequencing, Safety/Judgement, Problem solving, Orientation, Awareness, Memory, Attention, Initiation   Orientation impairments: Time, Situation                   PT - Cognition Comments: Pt with increased confusion. Following commands: Impaired Following commands impaired: Follows one step commands with increased time    Cueing Cueing Techniques: Verbal cues, Tactile cues  Exercises      General Comments        Pertinent Vitals/Pain Pain Assessment Pain Assessment: Faces Faces Pain Scale: Hurts even more Pain  Location: RLE Pain Descriptors / Indicators: Grimacing, Guarding, Moaning Pain Intervention(s): Limited activity within patient's tolerance, Repositioned, Monitored during session    Home Living                          Prior Function            PT Goals (current goals can now be found in the care plan section) Acute Rehab PT Goals Patient Stated Goal: not stated Progress towards PT goals: Not progressing toward goals - comment    Frequency    Min 2X/week      PT Plan      Co-evaluation               AM-PAC PT "6 Clicks" Mobility   Outcome Measure  Help needed turning from your back to your side while in a flat bed without using bedrails?: A Lot Help needed moving from lying on your back to sitting on the side of a flat bed without using bedrails?: A Lot Help needed moving to and from a bed to a chair (including a wheelchair)?: Total Help needed standing up from a chair using your arms (e.g., wheelchair or bedside chair)?: Total Help needed to walk in hospital room?: Total Help needed climbing 3-5 steps with a railing? : Total 6 Click Score: 8    End of Session   Activity Tolerance: Patient limited by lethargy;Patient limited by fatigue;Treatment limited secondary to medical complications (Comment) (Possible orthostatic) Patient left: with call bell/phone within reach;with family/visitor present;in bed;with bed alarm set Nurse Communication: Mobility status PT Visit Diagnosis: Unsteadiness on feet (R26.81);Other abnormalities of gait and mobility (R26.89);Muscle weakness (generalized) (M62.81)     Time: 1101-1130 PT Time Calculation (min) (ACUTE ONLY): 29 min  Charges:    $Therapeutic Activity: 23-37 mins PT General Charges $$ ACUTE PT VISIT: 1 Visit                     [facility] PT Acute Rehabilitation Services Office [phone]    Angelina Ok [facility] 08/20/2023, 1:53 PM

## 2023-08-20 NOTE — Plan of Care (Signed)
  Problem: Education: Goal: Understanding of cardiac disease, CV risk reduction, and recovery process will improve Outcome: Progressing   Problem: Activity: Goal: Ability to tolerate increased activity will improve Outcome: Progressing   Problem: Activity: Goal: Ability to return to baseline activity level will improve Outcome: Progressing

## 2023-08-21 ENCOUNTER — Inpatient Hospital Stay (HOSPITAL_COMMUNITY)

## 2023-08-21 DIAGNOSIS — I200 Unstable angina: Secondary | ICD-10-CM | POA: Diagnosis not present

## 2023-08-21 LAB — PROCALCITONIN: Procalcitonin: 4.15 ng/mL

## 2023-08-21 LAB — GLUCOSE, CAPILLARY
Glucose-Capillary: 133 mg/dL — ABNORMAL HIGH (ref 70–99)
Glucose-Capillary: 125 mg/dL — ABNORMAL HIGH (ref 70–99)
Glucose-Capillary: 117 mg/dL — ABNORMAL HIGH (ref 70–99)
Glucose-Capillary: 120 mg/dL — ABNORMAL HIGH (ref 70–99)

## 2023-08-21 LAB — BASIC METABOLIC PANEL
Anion gap: 13 (ref 5–15)
Anion gap: 9 (ref 5–15)
BUN: 33 mg/dL — ABNORMAL HIGH (ref 8–23)
BUN: 42 mg/dL — ABNORMAL HIGH (ref 8–23)
CO2: 26 mmol/L (ref 22–32)
CO2: 28 mmol/L (ref 22–32)
Calcium: 8.5 mg/dL — ABNORMAL LOW (ref 8.9–10.3)
Calcium: 8.4 mg/dL — ABNORMAL LOW (ref 8.9–10.3)
Chloride: 95 mmol/L — ABNORMAL LOW (ref 98–111)
Chloride: 98 mmol/L (ref 98–111)
Creatinine, Ser: 1.9 mg/dL — ABNORMAL HIGH (ref 0.61–1.24)
Creatinine, Ser: 2.12 mg/dL — ABNORMAL HIGH (ref 0.61–1.24)
GFR, Estimated: 36 mL/min — ABNORMAL LOW (ref >60)
GFR, Estimated: 32 mL/min — ABNORMAL LOW (ref >60)
Glucose, Bld: 126 mg/dL — ABNORMAL HIGH (ref 70–99)
Glucose, Bld: 133 mg/dL — ABNORMAL HIGH (ref 70–99)
Potassium: 5 mmol/L (ref 3.5–5.1)
Potassium: 5 mmol/L (ref 3.5–5.1)
Sodium: 134 mmol/L — ABNORMAL LOW (ref 135–145)
Sodium: 135 mmol/L (ref 135–145)

## 2023-08-21 LAB — HEPATIC FUNCTION PANEL
ALT: 24 U/L (ref 0–44)
AST: 42 U/L — ABNORMAL HIGH (ref 15–41)
Albumin: 2.3 g/dL — ABNORMAL LOW (ref 3.5–5.0)
Alkaline Phosphatase: 68 U/L (ref 38–126)
Bilirubin, Direct: 0.7 mg/dL — ABNORMAL HIGH (ref 0.0–0.2)
Indirect Bilirubin: 0.9 mg/dL (ref 0.3–0.9)
Total Bilirubin: 1.6 mg/dL — ABNORMAL HIGH (ref 0.0–1.2)
Total Protein: 7.2 g/dL (ref 6.5–8.1)

## 2023-08-21 LAB — APTT: aPTT: 100 s — ABNORMAL HIGH (ref 24–36)

## 2023-08-21 LAB — COOXEMETRY PANEL
Carboxyhemoglobin: 1.6 % — ABNORMAL HIGH (ref 0.5–1.5)
Methemoglobin: <0.7 % (ref 0.0–1.5)
O2 Saturation: 60.5 %
Total hemoglobin: 13.2 g/dL (ref 12.0–16.0)

## 2023-08-21 LAB — AMMONIA: Ammonia: 51 umol/L — ABNORMAL HIGH (ref 9–35)

## 2023-08-21 LAB — MAGNESIUM: Magnesium: 2.4 mg/dL (ref 1.7–2.4)

## 2023-08-21 MED ORDER — AMIODARONE HCL IN DEXTROSE 360-4.14 MG/200ML-% IV SOLN: 30.0000 mg/h | INTRAVENOUS | Status: DC

## 2023-08-21 MED ORDER — SODIUM CHLORIDE 0.9 % IV SOLN
1.0000 g | INTRAVENOUS | Status: DC
  Administered 2023-08-21: 1 g via INTRAVENOUS
  Filled 2023-08-21: qty 10

## 2023-08-21 MED ORDER — SODIUM CHLORIDE 0.9 % IV SOLN
2.0000 g | Freq: Two times a day (BID) | INTRAVENOUS | Status: DC
  Administered 2023-08-21 (×2): 2 g via INTRAVENOUS
  Filled 2023-08-21 (×2): qty 12.5

## 2023-08-21 MED ORDER — VANCOMYCIN HCL 1500 MG/300ML IV SOLN
1500.0000 mg | INTRAVENOUS | Status: DC
  Administered 2023-08-21: 1500 mg via INTRAVENOUS
  Filled 2023-08-21: qty 300

## 2023-08-21 MED ORDER — AMIODARONE HCL IN DEXTROSE 360-4.14 MG/200ML-% IV SOLN
30.0000 mg/h | INTRAVENOUS | Status: DC
  Administered 2023-08-21 – 2023-08-22 (×3): 30 mg/h via INTRAVENOUS
  Filled 2023-08-21 (×3): qty 200

## 2023-08-21 MED ORDER — HEPARIN (PORCINE) 25000 UT/250ML-% IV SOLN
1500.0000 [IU]/h | INTRAVENOUS | Status: DC
  Administered 2023-08-21: 1700 [IU]/h via INTRAVENOUS
  Administered 2023-08-22: 1650 [IU]/h via INTRAVENOUS
  Filled 2023-08-21 (×2): qty 250

## 2023-08-21 NOTE — TOC Progression Note (Signed)
 Transition of Care [facility]) - Progression Note    Patient Details  Name: Matthew May MRN: [id number] Date of Birth: Sep 19, 1945  [facility]) CM/SW Contact  Elliot Cousin, RN Phone Number: [phone] 08/21/2023, 3:09 PM  Clinical Narrative:     TOC CM spoke to pt and gave permission to call wife, [name]. Spoke to wife and states she is not sure what the discharge plan will be due to his current condition. She does want him to come home with [facility] or Home Hospice. Explained Palliative team will speak to her, and family about dc plan and put in a referral. Updated Unit RN CM with plan, Home Health vs Home Hospice with [facility]. (Medicare.gov list with ratings placed in chart for [facility] and provided to pt)   Will continue to follow for dc needs.   Expected Discharge Plan: Home w Home Health Services Barriers to Discharge: Continued Medical Work up  Expected Discharge Plan and Services   Discharge Planning Services: CM Consult Post Acute Care Choice: Home Health Living arrangements for the past 2 months: Single Family Home                                       Social Determinants of Health (SDOH) Interventions SDOH Screenings   Food Insecurity: No Food Insecurity (08/13/2023)  Housing: Low Risk  (08/13/2023)  Transportation Needs: No Transportation Needs (08/13/2023)  Utilities: Not At Risk (08/13/2023)  Alcohol Screen: Low Risk  (08/25/2022)  Depression (PHQ2-9): Low Risk  (10/26/2022)  Financial Resource Strain: Low Risk  (08/25/2022)  Physical Activity: Insufficiently Active (08/25/2022)  Social Connections: Moderately Isolated (08/13/2023)  Stress: No Stress Concern Present (08/25/2022)  Tobacco Use: Medium Risk (08/14/2023)    Readmission Risk Interventions     No data to display

## 2023-08-21 NOTE — Progress Notes (Signed)
 Pharmacy Antibiotic Note  Matthew May is a 78 y.o. male admitted on 08/13/2023 with sepsis.  Pharmacy has been consulted for vancomycin and cefepime dosing. Cr is up to 1.9 m/dl today.  Plan: Cefepime 2g IV q12h Vancomycin 1500mg  IV q48h - est AUC 514 Follow Cr, LOT, Cx  Height: 5\' 7"  (170.2 cm) Weight: 97 kg (213 lb 13.5 oz) IBW/kg (Calculated) : 66.1  Temp (24hrs), Avg:99.2 F (37.3 C), Min:98.2 F (36.8 C), Max:99.9 F (37.7 C)  Recent Labs  Lab 08/15/23 1006 08/15/23 1348 08/16/23 0457 08/16/23 1231 08/17/23 0500 08/18/23 0500 08/19/23 0504 08/20/23 0500 08/20/23 1649 08/21/23 0500  WBC  --   --  10.6*  --  10.6* 11.6* 10.1  --  31.2*  --   CREATININE  --   --  1.37*   < > 1.44* 1.53* 1.52* 1.32*  --  1.90*  LATICACIDVEN 1.3 1.2  --   --   --   --   --   --   --   --    < > = values in this interval not displayed.    Estimated Creatinine Clearance: 36.7 mL/min (A) (by C-G formula based on SCr of 1.9 mg/dL (H)).    Allergies  Allergen Reactions   Prednisone Other (See Comments)    Pt states "sugar went to 580"   Januvia [Sitagliptin] Nausea And Vomiting   Jardiance [Empagliflozin] Rash    Tolerated Farxiga samples   Lisinopril Cough      Fredonia Highland, PharmD, BCPS, [facility] Clinical Pharmacist [phone] Please check AMION for all [facility] Pharmacy numbers 08/21/2023

## 2023-08-21 NOTE — Progress Notes (Signed)
 PHARMACY - ANTICOAGULATION CONSULT NOTE  Pharmacy Consult for heparin Indication: atrial fibrillation  Allergies  Allergen Reactions   Prednisone Other (See Comments)    Pt states "sugar went to 580"   Januvia [Sitagliptin] Nausea And Vomiting   Jardiance [Empagliflozin] Rash    Tolerated Farxiga samples   Lisinopril Cough    Patient Measurements: Height: 5\' 7"  (170.2 cm) Weight: 97 kg (213 lb 13.5 oz) IBW/kg (Calculated) : 66.1 Heparin Dosing Weight: 89kg  Vital Signs: Temp: 98.6 F (37 C) (03/18 0727) Temp Source: Oral (03/18 0727) BP: 105/60 (03/18 0727) Pulse Rate: 99 (03/18 0727)  Labs: Recent Labs    08/19/23 0504 08/20/23 0500 08/20/23 1649 08/21/23 0500  HGB 11.8*  --  12.6*  --   HCT 38.1*  --  40.6  --   PLT 201  --  250  --   CREATININE 1.52* 1.32*  --  1.90*    Estimated Creatinine Clearance: 36.7 mL/min (A) (by C-G formula based on SCr of 1.9 mg/dL (H)).   Medical History: Past Medical History:  Diagnosis Date   Atrial fibrillation (HCC)    CAD (coronary artery disease)    a. 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 Pt with AF on apixaban. Unable to swallow pills and appears septic with AKI - will transition to IV heparin. Pt previously therapeutic on 1700 units/h.  Goal of Therapy:  Heparin level 0.3-0.7 units/ml aPTT 66-102 seconds Monitor  platelets by anticoagulation protocol: Yes   Plan:  Heparin 1700 units/h no bolus Check aPTT in 8h Daily aPTT, heparin level, CBC  Fredonia Highland, PharmD, [place], [facility] Clinical Pharmacist [phone] Please check AMION for all [facility] Pharmacy numbers 08/21/2023

## 2023-08-21 NOTE — Consult Note (Addendum)
 Initial Consultation Note   Patient: Matthew May:[id number] DOB: [birth year] PCP: [name], FNP DOA: 08/13/2023 DOS: the patient was seen and examined on 08/21/2023 Primary service: Zannie Cove, MD  Referring physician: CHF team Reason for consult: Lethargy, Encephalopathy  HPI: Matthew May is a 78 y.o. male with past medical history of Matthew May is a 78 year old with a history of CAD, CABG 2011, severe AS TAVR 2021, HTN,COPD, HLD, DMII, and PAF on eliquis, was admitted w/ weakness and chest pain to Cards service, Code STEMI + on admission, cancelled, Afib RVR, started on amio and Hep gtt, then Complicated by cardiogenic shock, ECHO noted EF 20-25% with severely reduced RV. -Admitted, treated with diuretics, R/LHC: Improved filling pressures, 90% stenosis of LIMA to LAD, medical management was recommended, subsequently underwent TEE and cardioversion for atrial flutter with RVR. -3/17 noted to be drowsy, delirious -3/18: Lethargic, worsening leukocytosis, TRH consulted   Assessment/Plan:  Toxic Encephalopathy Sepsis -WBC up to 31K -source not clear, CXR cannot rule out PNA, aspiration is a possibility -UA not suggestive of infection, also has a R PICC line, check Blood CX and RUQ [place]: considering mild tenderness and abd distension -Cover with broad-spectrum antibiotics, changed ceftriaxone to cefepime and Vanc, will d/w Cards re removing PICC  Acute BiV Failure -Improved with diuresis -R/LHC 03/13: Patent SVG-PLV/PDA, RCA known to be occluded, SVG to OM known to be occluded, p LCX occluded, LIMA to LAD w/ 90% stenosis at touchdown to distal LAD (likely culprit, but not amenable to PCI), filling pressures - TEE showed E 35-40%, mild LVH, moderate RV dysfunction.  -Heart failure team, now on oral torsemide, losartan, Coreg, Aldactone and digoxin, suggest holding torsemide today with decreased intake  CAD/CABG -LHC noted 90% stenosis of LIMA to LAD, recommended  medical management -Continue Plavix, Crestor and Eliquis  A-fib with RVR -Underwent TEE/DCCV this admission -Now back in atrial flutter, continue oral amiodarone and IV heparin  AKI -Cardiorenal, was improving -Now with uptrending creatinine again, likely secondary to sepsis, hold diuretics today  DVT proph: IV Heparin DNR Family: d/w spouse at bedside  Zannie Cove, MD  Review of Systems: unable to obtain: lethargy Past Medical History:  Diagnosis Date   Atrial fibrillation (HCC)    CAD (coronary artery disease)    a. 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   TAVR on 01/20/2020 (29 mm [employer] )   HLD (hyperlipidemia)    HTN (hypertension)    Obesity    Renal insufficiency    Subclavian artery stenosis, right (HCC)    based upon carotid U/S done 11/2012   Past Surgical History:  Procedure Laterality Date   CARDIOVERSION N/A 08/17/2023   Procedure: CARDIOVERSION;  Surgeon: Laurey Morale, MD;  Location: [facility] INVASIVE CV LAB;  Service: Cardiovascular;  Laterality: N/A;   CATARACT EXTRACTION Left    CORONARY ARTERY BYPASS GRAFT     2011, LIMA to LAD coronary artery, SVG to OM2 branch of lect circumflex coronary artery, and a sequential SVG to psot descening to posterolateral branches to RCA   endoscopic vein harvesting     right leg  HERNIA REPAIR     RIGHT HEART CATH AND CORONARY/GRAFT ANGIOGRAPHY N/A 08/16/2023   Procedure: RIGHT HEART CATH AND CORONARY/GRAFT ANGIOGRAPHY;  Surgeon: Laurey Morale, MD;  Location: [facility] INVASIVE CV LAB;  Service: Cardiovascular;  Laterality: N/A;   RIGHT/LEFT HEART CATH AND CORONARY/GRAFT  ANGIOGRAPHY N/A 12/31/2017   Procedure: RIGHT/LEFT HEART CATH AND CORONARY/GRAFT ANGIOGRAPHY;  Surgeon: Marykay Lex, MD;  Location: [facility] INVASIVE CV LAB;  Service: Cardiovascular;  Laterality: N/A;   RIGHT/LEFT HEART CATH AND CORONARY/GRAFT ANGIOGRAPHY N/A 12/30/2019   Procedure: RIGHT/LEFT HEART CATH AND CORONARY/GRAFT ANGIOGRAPHY;  Surgeon: Lyn Records, MD;  Location: [facility] INVASIVE CV LAB;  Service: Cardiovascular;  Laterality: N/A;   TEE WITHOUT CARDIOVERSION N/A 01/20/2020   Procedure: TRANSESOPHAGEAL ECHOCARDIOGRAM (TEE);  Surgeon: Tonny Bollman, MD;  Location: [facility] OR;  Service: Open Heart Surgery;  Laterality: N/A;   TONSILLECTOMY     TRANSCATHETER AORTIC VALVE REPLACEMENT, TRANSFEMORAL N/A 01/20/2020   Procedure: TRANSCATHETER AORTIC VALVE REPLACEMENT, TRANSFEMORALUSING EDWARDS SAPIEN 3 29 MM AORTIC THV.;  Surgeon: Tonny Bollman, MD;  Location: [facility] OR;  Service: Open Heart Surgery;  Laterality: N/A;   TRANSESOPHAGEAL ECHOCARDIOGRAM (CATH LAB) N/A 08/17/2023   Procedure: TRANSESOPHAGEAL ECHOCARDIOGRAM;  Surgeon: Laurey Morale, MD;  Location: [facility] INVASIVE CV LAB;  Service: Cardiovascular;  Laterality: N/A;   Social History:  reports that he quit smoking about 14 years ago. His smoking use included cigarettes. He has never used smokeless tobacco. He reports that he does not drink alcohol and does not use drugs.  Allergies  Allergen Reactions   Prednisone Other (See Comments)    Pt states "sugar went to 580"   Januvia [Sitagliptin] Nausea And Vomiting   Jardiance [Empagliflozin] Rash    Tolerated Farxiga samples   Lisinopril Cough    Family History  Problem Relation Age of Onset   Heart disease Father    Heart attack Father    Mental illness Mother    Mental illness Sister    Diabetes Brother    Depression Maternal Aunt     Prior to Admission medications   Medication Sig Start Date End Date Taking? Authorizing Provider  acetaminophen (TYLENOL) 325 MG tablet Take 2 tablets  (650 mg total) by mouth every 6 (six) hours as needed for mild pain (or Fever >/= 101). 01/21/20  Yes Roddenberry, Cecille Amsterdam, PA-C  albuterol (VENTOLIN HFA) 108 (90 Base) MCG/ACT inhaler Inhale 1-2 puffs into the lungs every 6 (six) hours as needed for wheezing or shortness of breath. 07/27/23  Yes [name], Christy A, FNP  Budeson-Glycopyrrol-Formoterol (BREZTRI AEROSPHERE) 160-9-4.8 MCG/ACT AERO Inhale 2 puffs into the lungs 2 (two) times daily. 12/28/22  Yes [name], Christy A, FNP  dapagliflozin propanediol (FARXIGA) 10 MG TABS tablet Take 1 tablet (10 mg total) by mouth daily. Patient taking differently: Take 10 mg by mouth in the morning. 07/27/23  Yes [name], Christy A, FNP  ELIQUIS 5 MG TABS tablet Take 1 tablet (5 mg total) by mouth 2 (two) times daily. 07/27/23  Yes [name], Christy A, FNP  gabapentin (NEURONTIN) 100 MG capsule Take 1 capsule (100 mg total) by mouth 3 (three) times daily. 07/27/23  Yes [name], Christy A, FNP  glimepiride (AMARYL) 4 MG tablet Take 1 tablet (4 mg total) by mouth daily with breakfast. 07/27/23  Yes [name], Christy A, FNP  levothyroxine (SYNTHROID) 150 MCG tablet Take 1 tablet (150 mcg total) by mouth daily. 07/27/23  Yes [name], Christy A, FNP  metFORMIN (GLUCOPHAGE) 1000 MG tablet Take 1 tablet (  1,000 mg total) by mouth 2 (two) times daily. 07/27/23  Yes [name], Christy A, FNP  potassium chloride SA (KLOR-CON M) 20 MEQ tablet TAKE 1 TABLET BY MOUTH DAILY 01/15/23  Yes Rollene Rotunda, MD  rosuvastatin (CRESTOR) 40 MG tablet Take 1 tablet (40 mg total) by mouth daily. Patient taking differently: Take 40 mg by mouth in the morning. 04/11/23  Yes Rollene Rotunda, MD  torsemide (DEMADEX) 20 MG tablet Take 2 tablets (40 mg total) by mouth daily. Patient taking differently: Take 40 mg by mouth in the morning. 07/27/23  Yes [name], Christy A, FNP  Vitamin D, Ergocalciferol, (DRISDOL) 1.25 MG (50000 UNIT) CAPS capsule Take 1 capsule by mouth once a week Patient taking differently: Take 50,000  Units by mouth once a week. On Wednesday 06/14/23  Yes [name], Neysa Bonito A, FNP  cetirizine (ZYRTEC) 10 MG tablet Take 1 tablet (10 mg total) by mouth daily. Patient not taking: Reported on 07/27/2023 04/07/21   [name] A, FNP  glucose blood (ONETOUCH VERIO) test strip CHECK BLOOD SUGAR DAILY Dx E11.9 03/28/23   [name], FNP  Lancets [facility] DELICA PLUS [place]) MISC CHECK BLOOD SUGAR DAILY Dx E11.9 08/14/23   [name] A, FNP  nitroGLYCERIN (NITROSTAT) 0.4 MG SL tablet DISSOLVE ONE TABLET UNDER THE TONGUE EVERY 5 MINUTES AS NEEDED FOR CHEST PAIN.  DO NOT EXCEED A TOTAL OF 3 DOSES IN [address] MINUTES 07/27/23   [name], [place]    Physical Exam: Vitals:   08/21/23 0149 08/21/23 0355 08/21/23 0541 08/21/23 0727  BP: (!) 134/106 (!) 134/122 106/62 105/60  Pulse: 98 97 98 99  Resp: (!) 22 (!) 21 (!) 22 (!) 24  Temp: 98.2 F (36.8 C) 99.3 F (37.4 C)  98.6 F (37 C)  TempSrc: Oral Oral  Oral  SpO2: 92% 96%  90%  Weight:   97 kg   Height:       Obese chronically ill male laying in bed, somnolent, arousable, unable to assess orientation HEENT: No JVD CVS: S1-S2, regular rhythm Lungs: Few basilar rales Abdomen: Distended obese, mild right-sided tenderness, bowel sounds present : No edema  Author: Zannie Cove, MD 08/21/2023 9:17 AM  For on call review [email or web address].

## 2023-08-21 NOTE — Progress Notes (Addendum)
 PHARMACY - ANTICOAGULATION CONSULT NOTE  Pharmacy Consult for heparin Indication: atrial fibrillation  Allergies  Allergen Reactions   Prednisone Other (See Comments)    Pt states "sugar went to 580"   Januvia [Sitagliptin] Nausea And Vomiting   Jardiance [Empagliflozin] Rash    Tolerated Farxiga samples   Lisinopril Cough    Patient Measurements: Height: 5\' 7"  (170.2 cm) Weight: 97 kg (213 lb 13.5 oz) IBW/kg (Calculated) : 66.1 Heparin Dosing Weight: 89kg  Vital Signs: Temp: 98.1 F (36.7 C) (03/18 1551) Temp Source: Oral (03/18 1551) BP: 109/74 (03/18 1551) Pulse Rate: 94 (03/18 1551)  Labs: Recent Labs    08/19/23 0504 08/20/23 0500 08/20/23 1649 08/21/23 0500 08/21/23 1307 08/21/23 1752  HGB 11.8*  --  12.6*  --   --   --   HCT 38.1*  --  40.6  --   --   --   PLT 201  --  250  --   --   --   APTT  --   --   --   --   --  100*  CREATININE 1.52* 1.32*  --  1.90* 2.12*  --     Estimated Creatinine Clearance: 32.9 mL/min (A) (by C-G formula based on SCr of 2.12 mg/dL (H)).   Medical History: Past Medical History:  Diagnosis Date   Atrial fibrillation (HCC)    CAD (coronary artery disease)    a. 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 Pt with AF on apixaban. Unable to swallow pills and appears septic with AKI - will transition to IV heparin. Pt previously  therapeutic on 1700 units/h.  PM - aPTT 100 sec on upper end of therapeutic range on 1700 units/hr.    Goal of Therapy:  Heparin level 0.3-0.7 units/ml aPTT 66-102 seconds Monitor platelets by anticoagulation protocol: Yes   Plan:  Reduce heparin to 1650 units/h  Check aPTT in 8h (~AM labs) Daily aPTT, heparin level, CBC  Trixie Rude, PharmD Clinical Pharmacist 08/21/2023  7:12 PM

## 2023-08-21 NOTE — Evaluation (Signed)
 Clinical/Bedside Swallow Evaluation Patient Details  Name: Matthew May MRN: [id number] Date of Birth: 1945-10-03  Today's Date: 08/21/2023 Time: SLP Start Time (ACUTE ONLY): 1151 SLP Stop Time (ACUTE ONLY): 1204 SLP Time Calculation (min) (ACUTE ONLY): 13 min  Past Medical History:  Past Medical History:  Diagnosis Date   Atrial fibrillation (HCC)    CAD (coronary artery disease)    a. 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   TAVR on 01/20/2020 (29 mm [employer] )   HLD (hyperlipidemia)    HTN (hypertension)    Obesity    Renal insufficiency    Subclavian artery stenosis, right (HCC)    based upon carotid U/S done 11/2012   Past Surgical History:  Past Surgical History:  Procedure Laterality Date   CARDIOVERSION N/A 08/17/2023   Procedure: CARDIOVERSION;  Surgeon: Laurey Morale, MD;  Location: [facility] INVASIVE CV LAB;  Service: Cardiovascular;  Laterality: N/A;   CATARACT EXTRACTION Left    CORONARY ARTERY BYPASS GRAFT     2011, LIMA to LAD coronary artery, SVG to OM2 branch of lect circumflex coronary artery, and a sequential SVG to psot descening to posterolateral branches to RCA   endoscopic vein harvesting     right leg    HERNIA REPAIR     RIGHT HEART CATH AND CORONARY/GRAFT ANGIOGRAPHY N/A 08/16/2023   Procedure: RIGHT HEART CATH AND CORONARY/GRAFT ANGIOGRAPHY;  Surgeon: Laurey Morale, MD;  Location: [facility] INVASIVE CV LAB;  Service: Cardiovascular;  Laterality: N/A;   RIGHT/LEFT HEART CATH AND CORONARY/GRAFT ANGIOGRAPHY N/A 12/31/2017   Procedure: RIGHT/LEFT HEART CATH AND CORONARY/GRAFT ANGIOGRAPHY;  Surgeon: Marykay Lex, MD;   Location: [facility] INVASIVE CV LAB;  Service: Cardiovascular;  Laterality: N/A;   RIGHT/LEFT HEART CATH AND CORONARY/GRAFT ANGIOGRAPHY N/A 12/30/2019   Procedure: RIGHT/LEFT HEART CATH AND CORONARY/GRAFT ANGIOGRAPHY;  Surgeon: Lyn Records, MD;  Location: [facility] INVASIVE CV LAB;  Service: Cardiovascular;  Laterality: N/A;   TEE WITHOUT CARDIOVERSION N/A 01/20/2020   Procedure: TRANSESOPHAGEAL ECHOCARDIOGRAM (TEE);  Surgeon: Tonny Bollman, MD;  Location: [facility] OR;  Service: Open Heart Surgery;  Laterality: N/A;   TONSILLECTOMY     TRANSCATHETER AORTIC VALVE REPLACEMENT, TRANSFEMORAL N/A 01/20/2020   Procedure: TRANSCATHETER AORTIC VALVE REPLACEMENT, TRANSFEMORALUSING EDWARDS SAPIEN 3 29 MM AORTIC THV.;  Surgeon: Tonny Bollman, MD;  Location: [facility] OR;  Service: Open Heart Surgery;  Laterality: N/A;   TRANSESOPHAGEAL ECHOCARDIOGRAM (CATH LAB) N/A 08/17/2023   Procedure: TRANSESOPHAGEAL ECHOCARDIOGRAM;  Surgeon: Laurey Morale, MD;  Location: [facility] INVASIVE CV LAB;  Service: Cardiovascular;  Laterality: N/A;   HPI:  77 yo male admitted 3/10 with chest pain and cardiogenic shock, NSTEMI. 3/13 RHC/LHC. 3/14 s/p TEE. CXR 3/17 concerning for PNA or CHF. Pt with AMS and concern for aspiration so SLP swallow eval was ordered. PMhx: CAD, CABG, DM, PAF, AS s/p TAVR, COPD, HTN, HLD    Assessment / Plan / Recommendation  Clinical Impression  Pt's biggest barrier to PO intake at this time appears to be his mentation. He is drowsy, confused, and inconsistently following commands. He has reduced awareness and sustained attentiont to PO trials , exhibiting oral holding, concern for delayed  swallow initiation, and intermittent throat clearing/coughing with thin liquids. Frequent eructation is also noted - will need to see if he has any other baseline esophageal issues PTA, but he is not able to provide hx at this time. Agree with keeping him NPO today pending improvements in mentation. SLP Visit Diagnosis: Dysphagia, unspecified  (R13.10)    Aspiration Risk  Moderate aspiration risk;Risk for inadequate nutrition/hydration    Diet Recommendation NPO except meds;Ice chips PRN after oral care    Medication Administration: Crushed with puree (as mentation allows)    Other  Recommendations Oral Care Recommendations: Oral care QID    Recommendations for follow up therapy are one component of a multi-disciplinary discharge planning process, led by the attending physician.  Recommendations may be updated based on patient status, additional functional criteria and insurance authorization.  Follow up Recommendations Skilled nursing-short term rehab (<3 hours/day)      Assistance Recommended at Discharge    Functional Status Assessment Patient has had a recent decline in their functional status and demonstrates the ability to make significant improvements in function in a reasonable and predictable amount of time.  Frequency and Duration min 2x/week  2 weeks       Prognosis Prognosis for improved oropharyngeal function: Good Barriers to Reach Goals: Cognitive deficits      Swallow Study   General HPI: 78 yo male admitted 3/10 with chest pain and cardiogenic shock, NSTEMI. 3/13 RHC/LHC. 3/14 s/p TEE. CXR 3/17 concerning for PNA or CHF. Pt with AMS and concern for aspiration so SLP swallow eval was ordered. PMhx: CAD, CABG, DM, PAF, AS s/p TAVR, COPD, HTN, HLD Type of Study: Bedside Swallow Evaluation Previous Swallow Assessment: none in chart Diet Prior to this Study: NPO Temperature Spikes Noted: No Respiratory Status: Nasal cannula History of Recent Intubation: No Behavior/Cognition: Lethargic/Drowsy;Cooperative;Confused;Requires cueing Oral Cavity Assessment: Dried secretions Oral Care Completed by SLP: Yes Oral Cavity - Dentition: Edentulous Self-Feeding Abilities: Total assist Patient Positioning: Upright in bed Baseline Vocal Quality: Normal Volitional Cough: Cognitively unable to elicit Volitional  Swallow: Unable to elicit    Oral/Motor/Sensory Function Overall Oral Motor/Sensory Function:  (not following commands well for assessment, no overt focal deficits or asymmetry)   Ice Chips Ice chips: Not tested   Thin Liquid Thin Liquid: Impaired Presentation: Spoon;Straw Oral Phase Functional Implications: Other (comment) (anterior loss) Pharyngeal  Phase Impairments: Suspected delayed Swallow;Cough - Immediate;Throat Clearing - Immediate    Nectar Thick Nectar Thick Liquid: Not tested   Honey Thick Honey Thick Liquid: Not tested   Puree Puree: Impaired Presentation: Spoon Oral Phase Impairments: Poor awareness of bolus Oral Phase Functional Implications: Oral holding Pharyngeal Phase Impairments: Suspected delayed Swallow   Solid     Solid: Not tested      Mahala Menghini., M.A. CCC-SLP Acute Rehabilitation Services Office [phone]  Secure chat preferred  08/21/2023,12:50 PM

## 2023-08-21 NOTE — Plan of Care (Signed)

## 2023-08-21 NOTE — Progress Notes (Addendum)
 Advanced Heart Failure Rounding Note  Cardiologist: Rollene Rotunda, MD  Chief Complaint: Biventricular Heart Failure Subjective:    Co-ox 61%. CVP 6. AMS intermittently ongoing. + UTI by WBC and UA. UCx pending.  Cr bumped 1.3>1.9 with UTI. No output reordered yesterday. Weight up 3lbs. Back in AF overnight with rate in 90-100s  Drowsy and dosing off during exam. Confused this morning.   Objective:    Weight Range: 97 kg Body mass index is 33.49 kg/m.   Vital Signs:   Temp:  [98.2 F (36.8 C)-99.9 F (37.7 C)] 98.6 F (37 C) (03/18 0727) Pulse Rate:  [88-102] 99 (03/18 0727) Resp:  [18-32] 24 (03/18 0727) BP: (101-138)/(55-122) 105/60 (03/18 0727) SpO2:  [90 %-98 %] 90 % (03/18 0727) Weight:  [97 kg] 97 kg (03/18 0541) Last BM Date : 08/20/23  Weight change: Filed Weights   08/19/23 0513 08/20/23 0639 08/21/23 0541  Weight: 96.3 kg 95.6 kg 97 kg   Intake/Output:  Intake/Output Summary (Last 24 hours) at 08/21/2023 0748 Last data filed at 08/20/2023 1832 Gross per 24 hour  Intake --  Output 650 ml  Net -650 ml    Physical Exam    CVP 6 General: Dosing during exam Cardiac: JVP not elevated. S1 and S2 present. No murmurs or rub. Extremities: Warm and dry. Trace peripheral edema.  Neuro: Confused, drowsy. Answering 1 word question.  Lines/Devices: RUE PICC  Telemetry   AF 80-90s (personally reviewed)  Labs    CBC Recent Labs    08/19/23 0504 08/20/23 1649  WBC 10.1 31.2*  NEUTROABS  --  29.3*  HGB 11.8* 12.6*  HCT 38.1* 40.6  MCV 87.0 86.9  PLT 201 250   Basic Metabolic Panel Recent Labs    47/82/95 0500 08/21/23 0500  NA 135 134*  K 4.5 5.0  CL 96* 95*  CO2 27 26  GLUCOSE 100* 126*  BUN 21 33*  CREATININE 1.32* 1.90*  CALCIUM 8.4* 8.5*  MG 2.1 2.4   Liver Function Tests No results for input(s): "AST", "ALT", "ALKPHOS", "BILITOT", "PROT", "ALBUMIN" in the last 72 hours. No results for input(s): "LIPASE", "AMYLASE" in the last 72  hours. Cardiac Enzymes No results for input(s): "CKTOTAL", "CKMB", "CKMBINDEX", "TROPONINI" in the last 72 hours.  BNP: BNP (last 3 results) Recent Labs    08/13/23 1151 08/14/23 0511  BNP 324.7* 377.8*   Fasting Lipid Panel No results for input(s): "CHOL", "HDL", "LDLCALC", "TRIG", "CHOLHDL", "LDLDIRECT" in the last 72 hours.  Imaging   DG Chest 2 View Result Date: 08/20/2023 CLINICAL DATA:  Pneumonia EXAM: CHEST - 2 VIEW COMPARISON:  08/15/2023 FINDINGS: Frontal and lateral views of the chest demonstrate right-sided PICC tip in the region of the superior vena cava. Cardiac silhouette is enlarged, with stable postsurgical changes from aortic valve replacement and median sternotomy. There is persistent bibasilar consolidation with small bilateral pleural effusions again seen. No pneumothorax. IMPRESSION: 1. Persistent bibasilar consolidation and effusions, which could reflect pneumonia or congestive heart failure. Electronically Signed   By: Sharlet Salina M.D.   On: 08/20/2023 16:14   CT HEAD WO CONTRAST ( ) Result Date: 08/20/2023 CLINICAL DATA:  Altered mental status EXAM: CT HEAD WITHOUT CONTRAST TECHNIQUE: Contiguous axial images were obtained from the base of the skull through the vertex without intravenous contrast. RADIATION DOSE REDUCTION: This exam was performed according to the departmental dose-optimization program which includes automated exposure control, adjustment of the mA and/or kV according to patient size and/or use of  iterative reconstruction technique. COMPARISON:  None Available. FINDINGS: Brain: No acute intracranial hemorrhage. No CT evidence of acute infarct. Nonspecific hypoattenuation in the periventricular and subcortical white matter favored to reflect chronic microvascular ischemic changes. Generalized parenchymal volume loss. No edema, mass effect, or midline shift. The basilar cisterns are patent. Ventricles: Prominence of the ventricles suggesting underlying  parenchymal volume loss. Vascular: Atherosclerotic calcifications of the carotid siphons and intracranial vertebral arteries. No hyperdense vessel. Skull: No acute or aggressive finding. Orbits: Orbits are symmetric. Sinuses: The visualized paranasal sinuses are clear. Other: Mastoid air cells are clear. IMPRESSION: No CT evidence of acute intracranial abnormality. Moderate chronic microvascular ischemic changes. Mild parenchymal volume loss. Electronically Signed   By: Emily Filbert M.D.   On: 08/20/2023 13:25    Medications:    Scheduled Medications:  amiodarone  400 mg Oral BID   apixaban  5 mg Oral BID   carvedilol  3.125 mg Oral BID WC   Chlorhexidine Gluconate Cloth  6 each Topical Daily   clopidogrel  75 mg Oral Daily   dapagliflozin propanediol  10 mg Oral Daily   digoxin  0.125 mg Oral Daily   feeding supplement  237 mL Oral BID BM   fluticasone furoate-vilanterol  1 puff Inhalation Daily   And   umeclidinium bromide  1 puff Inhalation Daily   gabapentin  100 mg Oral TID   glimepiride  4 mg Oral Q breakfast   insulin aspart  0-9 Units Subcutaneous TID WC   levothyroxine  150 mcg Oral Q0600   loratadine  10 mg Oral Daily   losartan  25 mg Oral Daily   mupirocin ointment   Nasal BID   polyethylene glycol  17 g Oral BID   potassium chloride SA  20 mEq Oral Daily   QUEtiapine  25 mg Oral QHS   rosuvastatin  40 mg Oral Daily   senna  1 tablet Oral QHS   sodium chloride flush  10-40 mL Intracatheter Q12H   sodium chloride flush  3 mL Intravenous Q12H   spironolactone  25 mg Oral Daily   torsemide  40 mg Oral Daily   Vitamin D (Ergocalciferol)  50,000 Units Oral Weekly   Infusions:  PRN Medications: acetaminophen, acetaminophen, acetaminophen, albuterol, nitroGLYCERIN, ondansetron (ZOFRAN) IV, ondansetron (ZOFRAN) IV, sodium chloride flush, sodium chloride flush  Patient Profile   Mr. Trefz is a 78 year old white male with CAD status post CABG in 2011, severe aortic  stenosis status post TAVR in 2021, COPD, hyperlipidemia, type 2 diabetes and paroxysmal atrial fibrillation/flutter currently admitted with concerns for cardiogenic shock.   Assessment/Plan   Acute Biventricular HFrEF CAD  - Suspect mixed etiology with CAD and tachy mediated.  - Echo with EF down to 20-25% from previous 70-75% - [facility] 03/13: Patent sequential SVG-PLV/PDA, RCA known to be occluded, SVG to OM known to be occluded, p LCX occluded, LIMA to LAD w/ 90% stenosis at touchdown to distal LAD (likely culprit, small, not amenable to PCI), upper normal filling pressures with CI 1.97 - TEE showed E 35-40%, mild LVH, moderate RV dysfunction.  - Co-ox 61. CVP 6. Continue Torsemide 40 mg daily. Do not want to push diuresis too much with now SIRS. - continue Losartan 25 mg daily - continue Coreg 3.125 mg bid - stop [place] with active UTI - continue spiro 12.5 mg daily - continue digoxin 0.125 mg daily  CAD - h/o CABG 2011 LIMA to LAD, SVG to OM and sequential SVG to PDA and  PL branch - Chest pain started a few weeks ago. Suspected completed infart. HS Trop 6295>2841 - LHC as above. Suspect culprit was 90% stenosis LIMA to LAD at touchdown to distal LAD. Med management recommended - no recurrent chest pain - Continue plavix + crestor. No ASA with Eliquis  Acute Respiratory Distress - CXR 3/17 w pulm edema  - ABG yesterday on RA with 7.47/40/64/29, placed on 4L [place]  UTI Altered Mental Status - CTH negative, ammonia negative,  - UA + for many bacteria. Ucx pending.  - Start Rocephin for abx coverage - Stop SGLT2i - Concerned about asp PNA with mentation, NPO and SLP to eval   A flutter/Afib with RVR - A Flutter RVR on admit. - s/p TEE/DCCV - Appear in flutter on tele. Obtain 12 lead.  - continue eliquis - continue amio 400 mg bid  AKI - cardiorenal; improving with decongestion - Cr elevated with UTI up to 1.9.  H/o TAVR - Normal valve function on echo this admission.     Concerned with deconditioning. Aggressive PT/OT. Needs to keep a consistent schedule with delirium.   Code status: DNR  Length of Stay: 8  [place] Lee, NP  08/21/2023, 7:48 AM  Advanced Heart Failure Team Pager [phone] (M-F; 7a - 5p)  Please contact [facility] for night-coverage after hours (5p -7a ) and weekends on [email or web address]  Patient seen with NP, I formulated the plan and agree with the above note.   Patient remains confused.  He is able to follow commands with prompting.  Tm 99.3, WBCs up to 31.2.   CXR with possible PNA and he has some coughing.  CT head yesterday with no acute changes.  UA with bacteria but no WBCs, nitrites, or LE so probably not UTI.   Co-ox 61%, creatinine 1.32 => 1.9, CVP 6.   He is back in atrial fibrillation with HR 100s.   General: Drowsy, NAD Neck: No JVD, no thyromegaly or thyroid nodule.  Lungs: Crackles at bases.  CV: Nondisplaced PMI.  Heart mildly tachy, irregular S1/S2, no S3/S4, no murmur.  No peripheral edema.    Abdomen: Soft, mild RUQ tenderness, no hepatosplenomegaly, moderate distention.  Skin: Intact without lesions or rashes.  Neurologic: Confused, can follow commands with prompting.  Extremities: No clubbing or cyanosis.  HEENT: Normal.   I suspect he has delirium related to infection with WBCs up to 31.2.  I think PNA is most likely diagnosis. But also has moderately distended abdomen with mild RUQ tenderness.  - Empiric vancomycin/cefepime.  - Urine and blood cultures sent.  - Check procalcitonin.  - RUQ [place] for ?cholecystitis.   He appears to be back in AF this morning.  - Will get ECG.  - Stop po amiodarone and restart amiodarone gtt 30 mg/hr.  - If he cannot take Eliquis pills, will need to start IV heparin.   TEE on 3/14 showed LV E 35-40%, mild LVH, moderate RV dysfunction.  Volume status looks ok, CVP 6. Co-ox 61%.  Creatinine up to 1.9 with suspected infection.  SBP still 100s-110s.  - Hold torsemide, losartan,  spironolactone today and stop KCl.  - Stop Marcelline Deist for now.  - Stop digoxin with rise in creatinine.  - Can continue low dose Coreg for now.   Marca Ancona 08/21/2023 9:42 AM

## 2023-08-22 ENCOUNTER — Inpatient Hospital Stay (HOSPITAL_COMMUNITY)

## 2023-08-22 DIAGNOSIS — I5023 Acute on chronic systolic (congestive) heart failure: Secondary | ICD-10-CM

## 2023-08-22 DIAGNOSIS — G9341 Metabolic encephalopathy: Secondary | ICD-10-CM | POA: Diagnosis not present

## 2023-08-22 DIAGNOSIS — N390 Urinary tract infection, site not specified: Secondary | ICD-10-CM

## 2023-08-22 DIAGNOSIS — N179 Acute kidney failure, unspecified: Secondary | ICD-10-CM | POA: Diagnosis not present

## 2023-08-22 DIAGNOSIS — I2510 Atherosclerotic heart disease of native coronary artery without angina pectoris: Secondary | ICD-10-CM | POA: Diagnosis not present

## 2023-08-22 DIAGNOSIS — I4891 Unspecified atrial fibrillation: Secondary | ICD-10-CM | POA: Insufficient documentation

## 2023-08-22 DIAGNOSIS — I2583 Coronary atherosclerosis due to lipid rich plaque: Secondary | ICD-10-CM

## 2023-08-22 DIAGNOSIS — L89302 Pressure ulcer of unspecified buttock, stage 2: Secondary | ICD-10-CM

## 2023-08-22 DIAGNOSIS — L8990 Pressure ulcer of unspecified site, unspecified stage: Secondary | ICD-10-CM

## 2023-08-22 LAB — COMPREHENSIVE METABOLIC PANEL
ALT: 30 U/L (ref 0–44)
ALT: 35 U/L (ref 0–44)
AST: 60 U/L — ABNORMAL HIGH (ref 15–41)
AST: 78 U/L — ABNORMAL HIGH (ref 15–41)
Albumin: 2.2 g/dL — ABNORMAL LOW (ref 3.5–5.0)
Albumin: 2.1 g/dL — ABNORMAL LOW (ref 3.5–5.0)
Alkaline Phosphatase: 74 U/L (ref 38–126)
Alkaline Phosphatase: 75 U/L (ref 38–126)
Anion gap: 17 — ABNORMAL HIGH (ref 5–15)
Anion gap: 18 — ABNORMAL HIGH (ref 5–15)
BUN: 61 mg/dL — ABNORMAL HIGH (ref 8–23)
BUN: 69 mg/dL — ABNORMAL HIGH (ref 8–23)
CO2: 21 mmol/L — ABNORMAL LOW (ref 22–32)
CO2: 23 mmol/L (ref 22–32)
Calcium: 8.2 mg/dL — ABNORMAL LOW (ref 8.9–10.3)
Calcium: 8.3 mg/dL — ABNORMAL LOW (ref 8.9–10.3)
Chloride: 96 mmol/L — ABNORMAL LOW (ref 98–111)
Chloride: 93 mmol/L — ABNORMAL LOW (ref 98–111)
Creatinine, Ser: 3.28 mg/dL — ABNORMAL HIGH (ref 0.61–1.24)
Creatinine, Ser: 3.89 mg/dL — ABNORMAL HIGH (ref 0.61–1.24)
GFR, Estimated: 19 mL/min — ABNORMAL LOW (ref >60)
GFR, Estimated: 15 mL/min — ABNORMAL LOW (ref >60)
Glucose, Bld: 96 mg/dL (ref 70–99)
Glucose, Bld: 60 mg/dL — ABNORMAL LOW (ref 70–99)
Potassium: 5.3 mmol/L — ABNORMAL HIGH (ref 3.5–5.1)
Potassium: 6 mmol/L — ABNORMAL HIGH (ref 3.5–5.1)
Sodium: 134 mmol/L — ABNORMAL LOW (ref 135–145)
Sodium: 134 mmol/L — ABNORMAL LOW (ref 135–145)
Total Bilirubin: 1.1 mg/dL (ref 0.0–1.2)
Total Bilirubin: 1.2 mg/dL (ref 0.0–1.2)
Total Protein: 7.1 g/dL (ref 6.5–8.1)
Total Protein: 7.5 g/dL (ref 6.5–8.1)

## 2023-08-22 LAB — APTT: aPTT: 109 s — ABNORMAL HIGH (ref 24–36)

## 2023-08-22 LAB — CBC
HCT: 40.5 % (ref 39.0–52.0)
Hemoglobin: 12.6 g/dL — ABNORMAL LOW (ref 13.0–17.0)
MCH: 27 pg (ref 26.0–34.0)
MCHC: 31.1 g/dL (ref 30.0–36.0)
MCV: 86.9 fL (ref 80.0–100.0)
Platelets: 248 10*3/uL (ref 150–400)
RBC: 4.66 MIL/uL (ref 4.22–5.81)
RDW: 14.9 % (ref 11.5–15.5)
WBC: 34.4 10*3/uL — ABNORMAL HIGH (ref 4.0–10.5)
nRBC: 0 % (ref 0.0–0.2)

## 2023-08-22 LAB — MAGNESIUM: Magnesium: 2.6 mg/dL — ABNORMAL HIGH (ref 1.7–2.4)

## 2023-08-22 LAB — LACTIC ACID, PLASMA: Lactic Acid, Venous: 1.5 mmol/L (ref 0.5–1.9)

## 2023-08-22 LAB — GLUCOSE, CAPILLARY
Glucose-Capillary: 80 mg/dL (ref 70–99)
Glucose-Capillary: 62 mg/dL — ABNORMAL LOW (ref 70–99)

## 2023-08-22 LAB — TROPONIN I (HIGH SENSITIVITY): Troponin I (High Sensitivity): 634 ng/L (ref <18)

## 2023-08-22 LAB — HEPARIN LEVEL (UNFRACTIONATED): Heparin Unfractionated: >1.1 [IU]/mL — ABNORMAL HIGH (ref 0.30–0.70)

## 2023-08-22 MED ORDER — DEXTROSE 50 % IV SOLN
12.5000 g | INTRAVENOUS | Status: AC
  Administered 2023-08-22: 12.5 g via INTRAVENOUS

## 2023-08-22 MED ORDER — FUROSEMIDE 10 MG/ML IJ SOLN
80.0000 mg | Freq: Once | INTRAMUSCULAR | Status: AC
Start: 2023-08-22 — End: 2023-08-22
  Administered 2023-08-22: 80 mg via INTRAVENOUS
  Filled 2023-08-22: qty 8

## 2023-08-22 MED ORDER — BIOTENE DRY MOUTH MT LIQD: 15.0000 mL | OROMUCOSAL | Status: DC | PRN

## 2023-08-22 MED ORDER — HALOPERIDOL 0.5 MG PO TABS: 0.5000 mg | ORAL_TABLET | ORAL | Status: DC | PRN

## 2023-08-22 MED ORDER — GLYCOPYRROLATE 0.2 MG/ML IJ SOLN: 0.2000 mg | INTRAMUSCULAR | Status: DC | PRN

## 2023-08-22 MED ORDER — DEXTROSE 50 % IV SOLN
INTRAVENOUS | Status: AC
  Filled 2023-08-22: qty 50

## 2023-08-22 MED ORDER — GLYCOPYRROLATE 1 MG PO TABS: 1.0000 mg | ORAL_TABLET | ORAL | Status: DC | PRN

## 2023-08-22 MED ORDER — HALOPERIDOL LACTATE 5 MG/ML IJ SOLN: 0.5000 mg | INTRAMUSCULAR | Status: DC | PRN

## 2023-08-22 MED ORDER — ACETAMINOPHEN 650 MG RE SUPP: 650.0000 mg | Freq: Four times a day (QID) | RECTAL | Status: DC | PRN

## 2023-08-22 MED ORDER — HYDROMORPHONE HCL 1 MG/ML IJ SOLN
1.0000 mg | INTRAMUSCULAR | Status: DC | PRN
  Administered 2023-08-22: 1 mg via INTRAVENOUS
  Filled 2023-08-22: qty 1

## 2023-08-22 MED ORDER — ACETAMINOPHEN 325 MG PO TABS: 650.0000 mg | ORAL_TABLET | Freq: Four times a day (QID) | ORAL | Status: DC | PRN

## 2023-08-22 MED ORDER — HALOPERIDOL LACTATE 2 MG/ML PO CONC: 0.5000 mg | ORAL | Status: DC | PRN

## 2023-08-22 MED ORDER — POLYVINYL ALCOHOL 1.4 % OP SOLN: 1.0000 [drp] | Freq: Four times a day (QID) | OPHTHALMIC | Status: DC | PRN

## 2023-08-22 MED ORDER — CEFEPIME HCL 2 G IV SOLR: 2.0000 g | INTRAVENOUS | Status: DC

## 2023-08-22 MED ORDER — ACETAMINOPHEN 650 MG RE SUPP
650.0000 mg | Freq: Once | RECTAL | Status: AC | PRN
  Administered 2023-08-22: 650 mg via RECTAL
  Filled 2023-08-22: qty 1

## 2023-08-22 MED ORDER — ATORVASTATIN CALCIUM 80 MG PO TABS: 80.0000 mg | ORAL_TABLET | Freq: Every day | ORAL | Status: DC

## 2023-08-22 MED ORDER — ALBUTEROL SULFATE (2.5 MG/3ML) 0.083% IN NEBU
2.5000 mg | INHALATION_SOLUTION | Freq: Once | RESPIRATORY_TRACT | Status: AC
  Administered 2023-08-22: 2.5 mg via RESPIRATORY_TRACT
  Filled 2023-08-22: qty 3

## 2023-08-26 LAB — CULTURE, BLOOD (ROUTINE X 2)
Culture: NO GROWTH
Culture: NO GROWTH
Special Requests: ADEQUATE

## 2023-09-04 NOTE — IPAL (Signed)
 I spoke with patient's family at the bedside. I explained that Matthew May condition is worsening despite aggressive medical care. Currently into multiorgan failure with very poor prognosis. They confirmed he is DNR.  They agree with plan of no further escalation of care and adding comfort medications to avoid suffering. If patient continues to deteriorate will transition to full comfort care.  All questions were addressed.

## 2023-09-04 NOTE — Progress Notes (Signed)
 PHARMACY - ANTICOAGULATION CONSULT NOTE  Pharmacy Consult for heparin Indication: atrial fibrillation  Labs: Recent Labs    08/19/23 0504 08/20/23 0500 08/20/23 1649 08/21/23 0500 08/21/23 1307 08/21/23 1752 08/20/2023 0251  HGB 11.8*  --  12.6*  --   --   --  12.6*  HCT 38.1*  --  40.6  --   --   --  40.5  PLT 201  --  250  --   --   --  248  APTT  --   --   --   --   --  100* 109*  HEPARINUNFRC  --   --   --   --   --   --  >1.10*  CREATININE 1.52*   < >  --  1.90* 2.12*  --  3.28*  TROPONINIHS  --   --   --   --   --   --  634*   < > = values in this interval not displayed.   Assessment: 78yo male supratherapeutic on heparin with higher PTT despite decreased rate; no infusion issues or signs of bleeding per RN.  Goal of Therapy:  aPTT 66-102 seconds   Plan:  Decrease heparin infusion by 1-2 units/kg/hr to 1500 units/hr. Check level in 8 hours.   Vernard Gambles, PharmD, BCPS 08/21/2023 4:22 AM

## 2023-09-04 NOTE — Progress Notes (Addendum)
 Overnight progress note  Informed by RN that patient desatted to 70-80s on 4 L [place].  RT did oral suctioning and placed the patient on nonrebreather.  Current vital signs: Temperature 100.1 F, pulse 90, respiratory rate 22, blood pressure 126/86, and SpO2 95% on 15 L via nonrebreather.  Patient is not endorsing chest pain.  Given history of COPD, RT requested breathing treatment and albuterol neb ordered.  Follow-up stat EKG and chest x-ray.  Addendum/update: EKG showing sinus rhythm with first-degree AV block and left bundle branch block, no significant change compared to previous EKG.  Will also check stat troponin.  Chest x-ray pending.  Addendum/update: Patient was placed on heated high flow nasal cannula by RT. Currently satting in the mid 90s on 45 L HHFNC, 65% FiO2.  Chest x-ray showing no new or worsening findings.  Troponin 634, improved compared to labs done 9 days ago when it was >2500.  Patient is not endorsing chest pain.  ?PE but patient is already anticoagulated with IV heparin and not tachycardic.  Blood pressure stable.  Avoiding obtaining CT angiogram chest due to AKI.  Continue IV heparin and VQ scan can be done in the morning if not improving.

## 2023-09-04 NOTE — TOC Progression Note (Signed)
 Transition of Care [facility]) - Progression Note    Patient Details  Name: Matthew May MRN: [id number] Date of Birth: 1945/11/08  [facility] CM/SW Contact  Nicanor Bake Phone Number: [phone] 08/09/2023, 10:14 AM  Clinical Narrative:   HF CSW met with pt, wife, and daughter at bedside. Pt appeared to be lethargic.  Pts wife and daughter stated at this time they do not need anything or have any questions. CSW will continue to follow and monitor for dc plan.   TOC will continue following.     Expected Discharge Plan: Home w Home Health Services Barriers to Discharge: Continued Medical Work up  Expected Discharge Plan and Services   Discharge Planning Services: CM Consult Post Acute Care Choice: Home Health Living arrangements for the past 2 months: Single Family Home                                       Social Determinants of Health (SDOH) Interventions SDOH Screenings   Food Insecurity: No Food Insecurity (08/13/2023)  Housing: Low Risk  (08/13/2023)  Transportation Needs: No Transportation Needs (08/13/2023)  Utilities: Not At Risk (08/13/2023)  Alcohol Screen: Low Risk  (08/25/2022)  Depression (PHQ2-9): Low Risk  (10/26/2022)  Financial Resource Strain: Low Risk  (08/25/2022)  Physical Activity: Insufficiently Active (08/25/2022)  Social Connections: Moderately Isolated (08/13/2023)  Stress: No Stress Concern Present (08/25/2022)  Tobacco Use: Medium Risk (08/14/2023)    Readmission Risk Interventions     No data to display

## 2023-09-04 NOTE — Assessment & Plan Note (Signed)
 Sp CABG.  Patient does note tolerate po meds due to risk of aspiration.

## 2023-09-04 NOTE — Progress Notes (Signed)
 SLP Cancellation Note  Patient Details Name: Matthew May MRN: [id number] DOB: Nov 01, 1945   Cancelled treatment:       Reason Eval/Treat Not Completed: Medical issues which prohibited therapy. Pt currently lethargic. Per chart pt O2 sats dropped significantly last night. Pt currently on HHFNC @ 35L, FiO2 50%. Will continue efforts.  Cherrie Franca B. Murvin Natal, [facility], CCC-SLP Speech Language Pathologist Office: [phone]  Leigh Aurora 08/06/2023, 10:08 AM

## 2023-09-04 NOTE — Progress Notes (Signed)
 PT Cancellation Note  Patient Details Name: Matthew May MRN: [id number] DOB: July 21, 1945   Cancelled Treatment:    Reason Eval/Treat Not Completed: Medical issues which prohibited therapy;Fatigue/lethargy limiting ability to participate (Per conversation with DO and RN, not appropriate for PT treatment at this time d/t somnolence and increased oxygen needs via HHFNC. Will follow-up when appropriate.)  Cheri Guppy, PT, DPT Acute Rehabilitation Services Office: [phone] Secure Chat Preferred  Richardson Chiquito 08/07/2023, 10:23 AM

## 2023-09-04 NOTE — Assessment & Plan Note (Signed)
 Worsening mentation, likely worsening hypoactive delirium.  He can no longer sustain PO intake or PO meds due to risk of aspiration.   No further escalation of care.  Add hydromorphone for comfort care.

## 2023-09-04 NOTE — Anesthesia Postprocedure Evaluation (Addendum)
 Anesthesia Post Note  Patient: Matthew May  Procedure(s) Performed: TRANSESOPHAGEAL ECHOCARDIOGRAM CARDIOVERSION     Patient location during evaluation: Cath Lab Anesthesia Type: General Level of consciousness: awake and patient cooperative Pain management: pain level controlled Vital Signs Assessment: post-procedure vital signs reviewed and stable Respiratory status: spontaneous breathing, nonlabored ventilation and respiratory function stable Cardiovascular status: stable and blood pressure returned to baseline Postop Assessment: no apparent nausea or vomiting Anesthetic complications: yes                  Isaias Dowson

## 2023-09-04 NOTE — Progress Notes (Signed)
   08/21/2023 0000  Assess: MEWS Score  Temp 99 F (37.2 C)  BP (!) 97/53  MAP (mmHg) 67  Pulse Rate 86  ECG Heart Rate 86  Resp (!) 21  Level of Consciousness Alert  SpO2 92 %  O2 Device Nasal Cannula  Patient Activity (if Appropriate) In bed  O2 Flow Rate (L/min) 4 L/min  Assess: MEWS Score  MEWS Temp 0  MEWS Systolic 1  MEWS Pulse 0  MEWS RR 1  MEWS LOC 0  MEWS Score 2  MEWS Score Color Yellow  Assess: if the MEWS score is Yellow or Red  Were vital signs accurate and taken at a resting state? Yes  Does the patient meet 2 or more of the SIRS criteria? No  MEWS guidelines implemented  Yes, yellow  Treat  MEWS Interventions Considered administering scheduled or prn medications/treatments as ordered  Take Vital Signs  Increase Vital Sign Frequency  Yellow: Q2hr x1, continue Q4hrs until patient remains green for 12hrs  Escalate  MEWS: Escalate Yellow: Discuss with charge nurse and consider notifying provider and/or RRT  Notify: Charge Nurse/RN  Name of Charge Nurse/RN Notified Matthew, RN  Assess: SIRS CRITERIA  SIRS Temperature  0  SIRS Respirations  1  SIRS Pulse 0  SIRS WBC 0  SIRS Score Sum  1

## 2023-09-04 NOTE — Assessment & Plan Note (Signed)
 Urine analysis with no pyuria.   US abdomen with distended gallbladder with sludge, wall thickening a question small amount of adjacent fluid.   Chest radiograph with bibasilar atelectasis, no frank mnemonic infiltrates.   Will continue antibiotic therapy today with Cefepime, Ok to discontinue vancomycin.  Doubt sepsis

## 2023-09-04 NOTE — Progress Notes (Signed)
 OT Cancellation Note  Patient Details Name: Matthew May MRN: [id number] DOB: February 22, 1946   Cancelled Treatment:    Reason Eval/Treat Not Completed: Medical issues which prohibited therapy;Fatigue/lethargy limiting ability to participate (Per DO and RN, pt not appropriate for OT treatment at this time due to somnolence and increased oxygen needs via HHFNC. OT will attempt to see pt at a later time as appropriate/available.)  [name] "Ronaldo Miyamoto" M., OTR/L, MA Acute Rehab [phone]  [name] 08/08/2023, 10:45 AM

## 2023-09-04 NOTE — Progress Notes (Signed)
 Patient O2 saturation dropped to 70's and 80s'. RN informed RT. RN did oral suction. RT changed patient's nasal cannula to non re-breather. Patient's O2 saturation went up to 96. MD notified via paged.

## 2023-09-04 NOTE — Plan of Care (Signed)
  Problem: Cardiac: Goal: Ability to achieve and maintain adequate cardiovascular perfusion will improve Outcome: Progressing   Problem: Education: Goal: Knowledge of General Education information will improve Description: Including pain rating scale, medication(s)/side effects and non-pharmacologic comfort measures Outcome: Progressing   Problem: Clinical Measurements: Goal: Will remain free from infection Outcome: Progressing   Problem: Clinical Measurements: Goal: Diagnostic test results will improve Outcome: Progressing   Problem: Clinical Measurements: Goal: Respiratory complications will improve Outcome: Progressing   Problem: Clinical Measurements: Goal: Cardiovascular complication will be avoided Outcome: Progressing

## 2023-09-04 NOTE — Hospital Course (Addendum)
 Matthew May is a 78 y.o. male with past medical history of CAD, CABG 2011, severe AS TAVR 2021, HTN,COPD, HLD, DMII, and PAF on eliquis, was admitted w/ weakness and chest pain to Cards service, Code STEMI + on admission, cancelled, Afib RVR, started on amio and Hep gtt, then Complicated by cardiogenic shock, ECHO noted EF 20-25% with severely reduced RV.  Admitted, treated with diuretics, R/LHC: Improved filling pressures, 90% stenosis of LIMA to LAD, medical management was recommended, subsequently underwent TEE and cardioversion for atrial flutter with RVR. -3/17 noted to be drowsy, delirious -3/18: Lethargic, worsening leukocytosis, TRH consulted 03/19 worsening mentation, multiorgan failure. Spoke with patient's family, plan for no further escalation of care and concentrate more on patient's comfort.   Patient continue to deteriorate through the course of the day, he was transitioned to comfort care. His family was at the bedside and he was allowed to have a natural death under comfort care.

## 2023-09-04 NOTE — Assessment & Plan Note (Signed)
 ATN, hyperkalemia, hyponatremia, hypochloremia.  Oliguria with urine output 200 cc  Patient with worsening renal function, poor candidate for renal replacement therapy.  Patient's family do not want dialysis, considering his poor prognosis.  Plan to continue supportive care, likely transition to comfort care in the next 24 hrs.

## 2023-09-04 NOTE — Death Summary Note (Signed)
 DEATH SUMMARY   Patient Details  Name: Matthew May MRN: [id number] DOB: 1945-08-16 WUJ:[id number], Edilia Bo, FNP Admission/Discharge Information   Admit Date:  2023-09-04  Date of Death: Date of Death: 2023-09-13  Time of Death: Time of Death: 1245  Length of Stay: 9   Principle Cause of death: heart failure   Hospital Diagnoses: Principal Problem:   Acute on chronic systolic (congestive) heart failure (HCC) Active Problems:   Acute metabolic encephalopathy   AKI (acute kidney injury) (HCC)   Coronary artery disease   Pressure injury of skin   UTI (urinary tract infection)   Atrial fibrillation [facility])   Hospital Course: Matthew May is a 78 y.o. male with past medical history of CAD, CABG 2011, severe AS TAVR 2021, HTN,COPD, HLD, DMII, and PAF on eliquis, was admitted w/ weakness and chest pain to Cards service, Code STEMI + on admission, cancelled, Afib RVR, started on amio and Hep gtt, then Complicated by cardiogenic shock, ECHO noted EF 20-25% with severely reduced RV.  Admitted, treated with diuretics, R/LHC: Improved filling pressures, 90% stenosis of LIMA to LAD, medical management was recommended, subsequently underwent TEE and cardioversion for atrial flutter with RVR. -3/17 noted to be drowsy, delirious -3/18: Lethargic, worsening leukocytosis, TRH consulted 09-13-2023 worsening mentation, multiorgan failure. Spoke with patient's family, plan for no further escalation of care and concentrate more on patient's comfort.   Patient continue to deteriorate through the course of the day, he was transitioned to comfort care. His family was at the bedside and he was allowed to have a natural death under comfort care.   Assessment and Plan: * Acute on chronic systolic (congestive) heart failure (HCC) Echocardiogram with reduced LV systolic function Ef 20 to 25%, global hypokinesis, mild LVH, RV systolic function severely reduced. RA with normal size,  LA with mild enlargement,  aortic valve sp TAVR with adequate function.   Advance biventricular failure.   Patient with worsening renal function, despite aggressive medical therapy.  Had one dose of furosemide IV today, 80 mg.  Will not further escalate care and if continues to deteriorate, will transition to comfort care, discuss with his family at the bedside.   Acute hypoxemic respiratory due to cardiogenic pulmonary edema and worsening renal function.  Continue heated high flow nasal cannula for now, considering his poor mental status he is not candidate for non invasive mechanical ventilation.   Acute metabolic encephalopathy Worsening mentation, likely worsening hypoactive delirium.  He can no longer sustain PO intake or PO meds due to risk of aspiration.   No further escalation of care.  Add hydromorphone for comfort care.   AKI (acute kidney injury) (HCC) ATN, hyperkalemia, hyponatremia, hypochloremia.  Oliguria with urine output 200 cc  Patient with worsening renal function, poor candidate for renal replacement therapy.  Patient's family do not want dialysis, considering his poor prognosis.  Plan to continue supportive care, likely transition to comfort care in the next 24 hrs.   Coronary artery disease Sp CABG.  Patient does note tolerate po meds due to risk of aspiration.   Pressure injury of skin Continue local skin care.    UTI (urinary tract infection) Urine analysis with no pyuria.   US abdomen with distended gallbladder with sludge, wall thickening a question small amount of adjacent fluid.   Chest radiograph with bibasilar atelectasis, no frank mnemonic infiltrates.   Will continue antibiotic therapy today with Cefepime, Ok to discontinue vancomycin.  Doubt sepsis   Atrial fibrillation (HCC)  Patient had direct current cardioversion.  Currently he is back in atrial flutter/ fibrillation.  Plan to continue amiodarone and heparin for now.   If further decompensation will  transition for comfort care.           The results of significant diagnostics from this hospitalization (including imaging, microbiology, ancillary and laboratory) are listed below for reference.   Significant Diagnostic Studies: DG CHEST PORT 1 VIEW Result Date: 08/28/2023 CLINICAL DATA:  [id number] with shortness of breath. EXAM: PORTABLE CHEST 1 VIEW COMPARISON:  Portable chest 08/20/2023 at 12:10 p.m. FINDINGS: 2:48 a.m. Again noted are sternotomy changes, CABG changes and a TAVR. Stable cardiomegaly. Central vessels are mildly prominent without overt edema. Small pleural effusions and patchy bibasilar airspace disease continue to be seen. The mid and upper lungs are generally clear. The overall aeration seems unchanged. There is aortic tortuosity and atherosclerosis with stable mediastinum. No new osseous findings. IMPRESSION: 1. Stable cardiomegaly with mild central vascular prominence but no overt edema. 2. Small pleural effusions and patchy bibasilar airspace disease continue to be seen. 3. No new or worsening findings. Electronically Signed   By: Almira Bar M.D.   On: 08/30/2023 03:39   US Abdomen Limited RUQ (LIVER/GB) Result Date: 08/21/2023 CLINICAL DATA:  Abdominal pain. EXAM: ULTRASOUND ABDOMEN LIMITED RIGHT UPPER QUADRANT COMPARISON:  CT angiogram 01/01/2020. FINDINGS: Gallbladder: Distended gallbladder with some wall thickening. There also dependent stones. Some gallbladder sludge also seen. Question tiny adjacent fluid. Common bile duct: Diameter: 3 mm Liver: Heterogeneous liver. Portal vein is patent on color Doppler imaging with normal direction of blood flow towards the liver. Other: None. IMPRESSION: Distended gallbladder with sludge, wall thickening a question tiny amount of adjacent fluid. Please correlate for other clinical evidence of acute cholecystitis if needed further workup such as HIDA scan. No biliary ductal dilatation. Heterogeneous echogenic liver. Electronically  Signed   By: Karen Kays M.D.   On: 08/21/2023 10:47   DG Chest 2 View Result Date: 08/20/2023 CLINICAL DATA:  Pneumonia EXAM: CHEST - 2 VIEW COMPARISON:  08/15/2023 FINDINGS: Frontal and lateral views of the chest demonstrate right-sided PICC tip in the region of the superior vena cava. Cardiac silhouette is enlarged, with stable postsurgical changes from aortic valve replacement and median sternotomy. There is persistent bibasilar consolidation with small bilateral pleural effusions again seen. No pneumothorax. IMPRESSION: 1. Persistent bibasilar consolidation and effusions, which could reflect pneumonia or congestive heart failure. Electronically Signed   By: Sharlet Salina M.D.   On: 08/20/2023 16:14   CT HEAD WO CONTRAST ( ) Result Date: 08/20/2023 CLINICAL DATA:  Altered mental status EXAM: CT HEAD WITHOUT CONTRAST TECHNIQUE: Contiguous axial images were obtained from the base of the skull through the vertex without intravenous contrast. RADIATION DOSE REDUCTION: This exam was performed according to the departmental dose-optimization program which includes automated exposure control, adjustment of the mA and/or kV according to patient size and/or use of iterative reconstruction technique. COMPARISON:  None Available. FINDINGS: Brain: No acute intracranial hemorrhage. No CT evidence of acute infarct. Nonspecific hypoattenuation in the periventricular and subcortical white matter favored to reflect chronic microvascular ischemic changes. Generalized parenchymal volume loss. No edema, mass effect, or midline shift. The basilar cisterns are patent. Ventricles: Prominence of the ventricles suggesting underlying parenchymal volume loss. Vascular: Atherosclerotic calcifications of the carotid siphons and intracranial vertebral arteries. No hyperdense vessel. Skull: No acute or aggressive finding. Orbits: Orbits are symmetric. Sinuses: The visualized paranasal sinuses are clear. Other: Mastoid air cells are  clear. IMPRESSION: No CT evidence of acute intracranial abnormality. Moderate chronic microvascular ischemic changes. Mild parenchymal volume loss. Electronically Signed   By: Emily Filbert M.D.   On: 08/20/2023 13:25   ECHO TEE Result Date: 08/17/2023    TRANSESOPHOGEAL ECHO REPORT   Patient Name:   ENRIGUE HASHIMI Date of Exam: 08/17/2023 Medical Rec #:  [id number]        Height:       67.0 in Accession #:    [id number]       Weight:       210.1 lb Date of Birth:  1945-08-17         BSA:          2.065 m Patient Age:    78 years         BP:           136/79 mmHg Patient Gender: M                HR:           120 bpm. Exam Location:  Inpatient Procedure: Transesophageal Echo, Cardiac Doppler and Color Doppler (Both            Spectral and Color Flow Doppler were utilized during procedure). Indications:     I48.92* Unspecified atrial flutter  History:         Patient has prior history of Echocardiogram examinations, most                  recent 08/14/2023. CHF, CAD, Abnormal ECG and Prior CABG, COPD,                  Aortic Valve Disease, Arrythmias:Atrial Flutter,                  Signs/Symptoms:Chest Pain; Risk Factors:Diabetes. TAVR.                  Aortic Valve: unknown Edwards Sapien prosthetic, stented (TAVR)                  valve is present in the aortic position.  Sonographer:     Sheralyn Boatman RDCS Referring Phys:  [id number] [place] LEE Diagnosing Phys: Wilfred Lacy PROCEDURE: After discussion of the risks and benefits of a TEE, an informed consent was obtained from the patient. The transesophogeal probe was passed without difficulty through the esophogus of the patient. Imaged were obtained with the patient in a left lateral decubitus position. Local oropharyngeal anesthetic was provided with Cetacaine. Sedation performed by different physician. The patient was monitored while under deep sedation. Anesthestetic sedation was provided intravenously by Anesthesiology: 67mg  of Propofol, 100mg  of Lidocaine. The  patient developed Respiratory depression during the procedure. A successful direct current cardioversion was performed at 360 joules with 1 attempt.  IMPRESSIONS  1. Left ventricular ejection fraction, by estimation, is 35 to 40%. The left ventricle has moderately decreased function. The left ventricle demonstrates regional wall motion abnormalities with mid-apical septal and apical akinesis. There is mild concentric left ventricular hypertrophy.  2. Peak RV-RA gradient 19 mmHg. Right ventricular systolic function is moderately reduced. The right ventricular size is normal.  3. Left atrial size was mildly dilated. No left atrial/left atrial appendage thrombus was detected.  4. Right atrial size was mildly dilated.  5. No PFO or ASD by color doppler.  6. The mitral valve is normal in structure. Trivial mitral valve regurgitation. No evidence of mitral stenosis.  7. Bioprosthetic aortic valve  s/p TAVR. No significant peri-valvular leakage. No significant stenosis. FINDINGS  Left Ventricle: Left ventricular ejection fraction, by estimation, is 35 to 40%. The left ventricle has moderately decreased function. The left ventricle demonstrates regional wall motion abnormalities. The left ventricular internal cavity size was small. There is mild concentric left ventricular hypertrophy. Right Ventricle: Peak RV-RA gradient 19 mmHg. The right ventricular size is normal. No increase in right ventricular wall thickness. Right ventricular systolic function is moderately reduced. Left Atrium: Left atrial size was mildly dilated. No left atrial/left atrial appendage thrombus was detected. Right Atrium: Right atrial size was mildly dilated. Pericardium: There is no evidence of pericardial effusion. Mitral Valve: The mitral valve is normal in structure. Trivial mitral valve regurgitation. No evidence of mitral valve stenosis. Tricuspid Valve: The tricuspid valve is normal in structure. Tricuspid valve regurgitation is trivial. No  evidence of tricuspid stenosis. Aortic Valve: Bioprosthetic aortic valve s/p TAVR. No significant peri-valvular leakage. No significant stenosis. The aortic valve has been repaired/replaced. Aortic valve regurgitation is not visualized. No aortic stenosis is present. Aortic valve mean gradient measures 1.0 mmHg. Aortic valve peak gradient measures 2.1 mmHg. There is a unknown Edwards Sapien prosthetic, stented (TAVR) valve present in the aortic position. Echo findings are consistent with normal structure and function of the aortic valve prosthesis. Pulmonic Valve: The pulmonic valve was normal in structure. Pulmonic valve regurgitation is not visualized. No evidence of pulmonic stenosis. Aorta: The aortic root and ascending aorta are structurally normal, with no evidence of dilitation. IAS/Shunts: No PFO or ASD by color doppler. Additional Comments: Spectral Doppler performed. AORTIC VALVE AV Vmax:      72.10 cm/s AV Vmean:     38.500 cm/s AV VTI:       0.108 m AV Peak Grad: 2.1 mmHg AV Mean Grad: 1.0 mmHg  AORTA Ao Asc diam: 3.60 cm Dalton McleanMD Electronically signed by Wilfred Lacy Signature Date/Time: 08/17/2023/6:11:51 PM    Final    EP STUDY Result Date: 08/17/2023 See surgical note for result.  CARDIAC CATHETERIZATION Result Date: 08/16/2023   Mid LAD lesion is 100% stenosed.   Prox Cx lesion is 100% stenosed.   Mid Cx lesion is 100% stenosed.   Prox RCA to Dist RCA lesion is 100% stenosed.   Origin to Prox Graft lesion is 100% stenosed.   Lat 1st Diag lesion is 90% stenosed.   Insertion lesion is 90% stenosed.   Prox LAD lesion is 95% stenosed.   Seq SVG- RPDA-RPL and is large.   LIMA and is normal in caliber.   SVG and is normal in caliber.   The graft exhibits no disease.   The graft exhibits no disease. 1. Low cardiac index at 1.97. 2. Filling pressures upper normal (PCWP 15, RA pressure 8). 3. Mild pulmonary venous hypertension. 4. PAPi preserved. 5. He had a patent sequential SVG-PLV/PDA.  RCA  known to be occluded and not injected.  SVG-OM known to be occluded and not injected.  The proximal LCx is occluded after a high OM1.  LIMA-LAD has a 90% stenosis with some haziness at the touchdown on the distal LAD.  This is likely the culprit for NSTEMI.  Patient is not having chest pain.  I reviewed films with Dr. Clifton James.  The LAD is small in caliber at the LIMA touchdown site and not favorable for PCI so we plan to medically manage.   DG CHEST PORT 1 VIEW Result Date: 08/15/2023 CLINICAL DATA:  [id number] Dyspnea and respiratory abnormalities [id number]  EXAM: PORTABLE CHEST 1 VIEW COMPARISON:  08/13/2023 FINDINGS: Interval placement of right-sided PICC line with distal tip terminating near the superior cavoatrial junction. Stable heart size status post sternotomy, CABG, and TAVR. Pulmonary vascular congestion. Diffuse interstitial prominence. Small bilateral pleural effusions. No pneumothorax. IMPRESSION: Findings of CHF with pulmonary edema and small bilateral pleural effusions. Electronically Signed   By: Duanne Guess D.O.   On: 08/15/2023 18:26   [place] EKG SITE RITE Result Date: 08/15/2023 If Site Rite image not attached, placement could not be confirmed due to current cardiac rhythm.  ECHOCARDIOGRAM COMPLETE Result Date: 08/14/2023    ECHOCARDIOGRAM REPORT   Patient Name:   VEDANT SHEHADEH Date of Exam: 08/14/2023 Medical Rec #:  [id number]        Height:       67.0 in Accession #:    [id number]       Weight:       228.0 lb Date of Birth:  March 14, 1946         BSA:          2.138 m Patient Age:    78 years         BP:           104/89 mmHg Patient Gender: M                HR:           122 bpm. Exam Location:  Inpatient Procedure: 2D Echo, Cardiac Doppler, Color Doppler and Intracardiac            Opacification Agent (Both Spectral and Color Flow Doppler were            utilized during procedure). Indications:     CAD native vessel I25.10  History:         Patient has prior history of Echocardiogram  examinations, most                  recent 06/08/2022. CAD, COPD; Risk Factors:Dyslipidemia, Diabetes                  and Hypertension.  Sonographer:     Harriette Bouillon RDCS Referring Phys:  [id number] Y[address]amp Diagnosing Phys: Arvilla Meres MD IMPRESSIONS  1. Left ventricular ejection fraction, by estimation, is 20 to 25%. The left ventricle has severely decreased function. The left ventricle demonstrates global hypokinesis. There is mild concentric left ventricular hypertrophy. Left ventricular diastolic  function could not be evaluated.  2. Right ventricular systolic function is severely reduced. The right ventricular size is mildly enlarged. Tricuspid regurgitation signal is inadequate for assessing PA pressure.  3. Left atrial size was mildly dilated.  4. The mitral valve is normal in structure. Trivial mitral valve regurgitation. No evidence of mitral stenosis.  5. The aortic valve has been repaired/replaced. Aortic valve regurgitation is not visualized. No aortic stenosis is present. Echo findings are consistent with normal structure and function of the aortic valve prosthesis. FINDINGS  Left Ventricle: Left ventricular ejection fraction, by estimation, is 20 to 25%. The left ventricle has severely decreased function. The left ventricle demonstrates global hypokinesis. Definity contrast agent was given IV to delineate the left ventricular endocardial borders. The left ventricular internal cavity size was normal in size. There is mild concentric left ventricular hypertrophy. Abnormal (paradoxical) septal motion, consistent with left bundle branch block. Left ventricular diastolic function could not be evaluated due to atrial fibrillation. Left ventricular diastolic function could not be evaluated. Right Ventricle: The  right ventricular size is mildly enlarged. No increase in right ventricular wall thickness. Right ventricular systolic function is severely reduced. Tricuspid regurgitation signal is inadequate for  assessing PA pressure. Left Atrium: Left atrial size was mildly dilated. Right Atrium: Right atrial size was normal in size. Pericardium: There is no evidence of pericardial effusion. Mitral Valve: The mitral valve is normal in structure. Mild mitral annular calcification. Trivial mitral valve regurgitation. No evidence of mitral valve stenosis. Tricuspid Valve: The tricuspid valve is normal in structure. Tricuspid valve regurgitation is trivial. No evidence of tricuspid stenosis. Aortic Valve: The aortic valve has been repaired/replaced. Aortic valve regurgitation is not visualized. No aortic stenosis is present. There is a 29 mm Sapien prosthetic, stented (TAVR) valve present in the aortic position. Echo findings are consistent with normal structure and function of the aortic valve prosthesis. Pulmonic Valve: The pulmonic valve was normal in structure. Pulmonic valve regurgitation is trivial. No evidence of pulmonic stenosis. Aorta: The aortic root is normal in size and structure. Venous: The inferior vena cava was not well visualized. IAS/Shunts: No atrial level shunt detected by color flow Doppler.  LEFT VENTRICLE PLAX 2D LVIDd:         4.60 cm     Diastology LVIDs:         4.20 cm     LV e' medial: 4.46 cm/s LV PW:         1.20 cm LV IVS:        1.30 cm LVOT diam:     2.10 cm LV SV:         20 LV SV Index:   9 LVOT Area:     3.46 cm  LV Volumes (MOD) LV vol d, MOD A4C: 60.5 ml LV vol s, MOD A4C: 57.7 ml LV SV MOD A4C:     60.5 ml RIGHT VENTRICLE TAPSE (M-mode): 0.8 cm LEFT ATRIUM             Index        RIGHT ATRIUM           Index LA diam:        4.70 cm 2.20 cm/m   RA Area:     15.10 cm LA Vol (A2C):   37.7 ml 17.63 ml/m  RA Volume:   39.00 ml  18.24 ml/m LA Vol (A4C):   70.1 ml 32.79 ml/m LA Biplane Vol: 55.4 ml 25.91 ml/m  AORTIC VALVE LVOT Vmax:   35.80 cm/s LVOT Vmean:  23.700 cm/s LVOT VTI:    0.057 m  AORTA Ao Root diam: 2.70 cm Ao Asc diam:  3.30 cm  SHUNTS Systemic VTI:  0.06 m Systemic Diam:  2.10 cm Arvilla Meres MD Electronically signed by Arvilla Meres MD Signature Date/Time: 08/14/2023/11:18:48 AM    Final (Updated)    DG Chest 2 View Result Date: 08/13/2023 CLINICAL DATA:  Chest pain.  Shortness of breath. EXAM: CHEST - 2 VIEW COMPARISON:  06/23/2022. FINDINGS: Low lung volume. Bilateral lung fields are clear. No dense consolidation or lung collapse. No overt pulmonary edema. Bilateral costophrenic angles are clear. Normal cardio-mediastinal silhouette. There are surgical staples along the heart border and sternotomy wires, status post CABG (coronary artery bypass graft). No acute osseous abnormalities. The soft tissues are within normal limits. IMPRESSION: No active cardiopulmonary disease. Electronically Signed   By: Jules Schick M.D.   On: 08/13/2023 15:37    Microbiology: Recent Results (from the past 240 hours)  MRSA Next Gen by PCR,  Nasal     Status: Abnormal   Collection Time: 08/15/23  2:51 PM   Specimen: Nasal Mucosa; Nasal Swab  Result Value Ref Range Status   MRSA by PCR Next Gen DETECTED (A) NOT DETECTED Final    Comment: RESULT CALLED TO, READ BACK BY AND VERIFIED WITH: RN Soundra Pilon [phone] @ 1747 FH (NOTE) The GeneXpert MRSA Assay (FDA approved for NASAL specimens only), is one component of a comprehensive MRSA colonization surveillance program. It is not intended to diagnose MRSA infection nor to guide or monitor treatment for MRSA infections. Test performance is not FDA approved in patients less than 28 years old. Performed at [facility], [address], [place], [place] [address]   Culture, blood (Routine X 2) w Reflex to ID Panel     Status: None (Preliminary result)   Collection Time: 08/21/23 12:59 PM   Specimen: BLOOD  Result Value Ref Range Status   Specimen Description BLOOD LEFT ANTECUBITAL  Final   Special Requests   Final    BOTTLES DRAWN AEROBIC AND ANAEROBIC Blood Culture results may not be optimal due to an inadequate volume of  blood received in culture bottles   Culture   Final    NO GROWTH < 24 HOURS Performed at [facility], [address] [place], [place] [address]    Report Status PENDING  Incomplete  Culture, blood (Routine X 2) w Reflex to ID Panel     Status: None (Preliminary result)   Collection Time: 08/21/23  1:07 PM   Specimen: BLOOD LEFT HAND  Result Value Ref Range Status   Specimen Description BLOOD LEFT HAND  Final   Special Requests   Final    BOTTLES DRAWN AEROBIC AND ANAEROBIC Blood Culture adequate volume   Culture   Final    NO GROWTH < 24 HOURS Performed at [facility], [address], [place], [place] [address]    Report Status PENDING  Incomplete    Signed: Wilbert Schouten Annett Gula, MD

## 2023-09-04 NOTE — Addendum Note (Signed)
 Addendum  created 08/06/2023 1302 by Val Eagle, MD   Clinical Note Signed

## 2023-09-04 NOTE — Assessment & Plan Note (Addendum)
Continue local skin care.

## 2023-09-04 NOTE — Assessment & Plan Note (Addendum)
 Echocardiogram with reduced LV systolic function Ef 20 to 25%, global hypokinesis, mild LVH, RV systolic function severely reduced. RA with normal size,  LA with mild enlargement, aortic valve sp TAVR with adequate function.   Advance biventricular failure.   Patient with worsening renal function, despite aggressive medical therapy.  Had one dose of furosemide IV today, 80 mg.  Will not further escalate care and if continues to deteriorate, will transition to comfort care, discuss with his family at the bedside.   Acute hypoxemic respiratory due to cardiogenic pulmonary edema and worsening renal function.  Continue heated high flow nasal cannula for now, considering his poor mental status he is not candidate for non invasive mechanical ventilation.

## 2023-09-04 NOTE — Assessment & Plan Note (Signed)
 Patient had direct current cardioversion.  Currently he is back in atrial flutter/ fibrillation.  Plan to continue amiodarone and heparin for now.   If further decompensation will transition for comfort care.

## 2023-09-04 NOTE — Progress Notes (Addendum)
 Progress Note   Patient: Matthew May OZH:[id number] DOB: 1946/01/26 DOA: 08/13/2023     9 DOS: the patient was seen and examined on 08/23/2023   Brief hospital course: CHIDI SHIRER is a 78 y.o. male with past medical history of CAD, CABG 2011, severe AS TAVR 2021, HTN,COPD, HLD, DMII, and PAF on eliquis, was admitted w/ weakness and chest pain to Cards service, Code STEMI + on admission, cancelled, Afib RVR, started on amio and Hep gtt, then Complicated by cardiogenic shock, ECHO noted EF 20-25% with severely reduced RV.  Admitted, treated with diuretics, R/LHC: Improved filling pressures, 90% stenosis of LIMA to LAD, medical management was recommended, subsequently underwent TEE and cardioversion for atrial flutter with RVR. -3/17 noted to be drowsy, delirious -3/18: Lethargic, worsening leukocytosis, TRH consulted 03/19 worsening mentation, multiorgan failure. Spoke with patient's family, plan for no further escalation of care and concentrate more on patient's comfort.   Assessment and Plan: * Acute on chronic systolic (congestive) heart failure (HCC) Echocardiogram with reduced LV systolic function Ef 20 to 25%, global hypokinesis, mild LVH, RV systolic function severely reduced. RA with normal size,  LA with mild enlargement, aortic valve sp TAVR with adequate function.   Advance biventricular failure.   Patient with worsening renal function, despite aggressive medical therapy.  Had one dose of furosemide IV today, 80 mg.  Will not further escalate care and if continues to deteriorate, will transition to comfort care, discuss with his family at the bedside.   Acute hypoxemic respiratory due to cardiogenic pulmonary edema and worsening renal function.  Continue heated high flow nasal cannula for now, considering his poor mental status he is not candidate for non invasive mechanical ventilation.   Acute metabolic encephalopathy Worsening mentation, likely worsening hypoactive  delirium.  He can no longer sustain PO intake or PO meds due to risk of aspiration.   No further escalation of care.  Add hydromorphone for comfort care.   AKI (acute kidney injury) (HCC) ATN, hyperkalemia, hyponatremia, hypochloremia.  Oliguria with urine output 200 cc  Patient with worsening renal function, poor candidate for renal replacement therapy.  Patient's family do not want dialysis, considering his poor prognosis.  Plan to continue supportive care, likely transition to comfort care in the next 24 hrs.   Coronary artery disease Sp CABG.  Patient does note tolerate po meds due to risk of aspiration.   Pressure injury of skin Continue local skin care.   UTI (urinary tract infection) Urine analysis with no pyuria.   US abdomen with distended gallbladder with sludge, wall thickening a question small amount of adjacent fluid.   Chest radiograph with bibasilar atelectasis, no frank mnemonic infiltrates.   Will continue antibiotic therapy today with Cefepime, Ok to discontinue vancomycin.  Doubt sepsis   Atrial fibrillation [facility]) Patient had direct current cardioversion.  Currently he is back in atrial flutter/ fibrillation.  Plan to continue amiodarone and heparin for now.   If further decompensation will transition for comfort care.       Subjective: Patient ill looking appearing, his is not responsive, his family is at the bedside. They confirm advance directives.   Physical Exam: Vitals:   09/03/2023 0217 08/25/2023 0544 08/25/2023 0549 09/02/2023 0826  BP:   105/69 117/60  Pulse:  77 80 77  Resp:  (!) 26 (!) 25 (!) 36  Temp:   (!) 100.8 F (38.2 C) 99.5 F (37.5 C)  TempSrc:   Axillary Axillary  SpO2: 95%  100%  100%  Weight:   96.6 kg   Height:       Neurology eyes closed not responsive to touch or voice.  ENT with pallor, dry mucous membranes Cardiovascular with S1 and S2 present, irregularly irregular with no gallops or rubs, positive systolic murmur at  the base Respiratory with bilateral rales with no wheezing, positive scattered rhonchi Abdomen with no distention  Positive lower extremity edema +  Data Reviewed:    Family Communication: I spoke with patient's family at the bedside, we talked in detail about patient's condition, plan of care and prognosis and all questions were addressed.   Disposition: Status is: Inpatient Remains inpatient appropriate because: critically ill   Planned Discharge Destination: Home    Author: Coralie Keens, MD 08/20/2023 9:20 AM  For on call review [email or web address].

## 2023-09-04 NOTE — Progress Notes (Addendum)
 Advanced Heart Failure Rounding Note  Cardiologist: Rollene Rotunda, MD  Chief Complaint: Biventricular Heart Failure Subjective:    Desat overnight to 70s. Now on HFNC 35L 50%. CXR with improved opacities, with small B/L pleural effusions.  ABD [place] concerning for acute cholecystitis. Tmax overnight 100.63F. WBC 10>31>34. On abx. Cr 1.9>2.12>3.28.   Somnolent on exam.   Objective:    Weight Range: 96.6 kg Body mass index is 33.35 kg/m.   Vital Signs:   Temp:  [98.1 F (36.7 C)-100.8 F (38.2 C)] 99.5 F (37.5 C) (03/19 0826) Pulse Rate:  [77-98] 77 (03/19 0826) Resp:  [15-36] 36 (03/19 0826) BP: (95-126)/(53-86) 117/60 (03/19 0826) SpO2:  [91 %-100 %] 100 % (03/19 0826) FiO2 (%):  [50 %-100 %] 50 % (03/19 0544) Weight:  [96.6 kg] 96.6 kg (03/19 0549) Last BM Date : 08/20/23  Weight change: Filed Weights   08/20/23 0639 08/21/23 0541 09/03/2023 0549  Weight: 95.6 kg 97 kg 96.6 kg   Intake/Output:  Intake/Output Summary (Last 24 hours) at 08/14/2023 0827 Last data filed at 08/14/2023 0655 Gross per 24 hour  Intake 0 ml  Output 200 ml  Net -200 ml    Physical Exam    General: Somnolent on HFNC Cardiac: JVP ~12cm. S1 and S2 present. No murmurs or rub. Resp: Coarse crackles throughout Abdomen: Soft, non-tender, non-distended. + BS. Extremities: Warm and dry.  1+ BLE edema.   Telemetry   In SR 80s with PVCs on tele (personally reviewed)  Labs    CBC Recent Labs    08/20/23 1649 08/23/2023 0251  WBC 31.2* 34.4*  NEUTROABS 29.3*  --   HGB 12.6* 12.6*  HCT 40.6 40.5  MCV 86.9 86.9  PLT 250 248   Basic Metabolic Panel Recent Labs    91/47/82 0500 08/21/23 1307 08/15/2023 0251  NA 134* 135 134*  K 5.0 5.0 5.3*  CL 95* 98 96*  CO2 26 28 21*  GLUCOSE 126* 133* 96  BUN 33* 42* 61*  CREATININE 1.90* 2.12* 3.28*  CALCIUM 8.5* 8.4* 8.2*  MG 2.4  --  2.6*   Liver Function Tests Recent Labs    08/21/23 1307 08/15/2023 0251  AST 42* 60*  ALT 24 30   ALKPHOS 68 74  BILITOT 1.6* 1.1  PROT 7.2 7.1  ALBUMIN 2.3* 2.2*   No results for input(s): "LIPASE", "AMYLASE" in the last 72 hours. Cardiac Enzymes No results for input(s): "CKTOTAL", "CKMB", "CKMBINDEX", "TROPONINI" in the last 72 hours.  BNP: BNP (last 3 results) Recent Labs    08/13/23 1151 08/14/23 0511  BNP 324.7* 377.8*   Fasting Lipid Panel No results for input(s): "CHOL", "HDL", "LDLCALC", "TRIG", "CHOLHDL", "LDLDIRECT" in the last 72 hours.  Imaging   DG CHEST PORT 1 VIEW Result Date: 08/28/2023 CLINICAL DATA:  [id number] with shortness of breath. EXAM: PORTABLE CHEST 1 VIEW COMPARISON:  Portable chest 08/20/2023 at 12:10 p.m. FINDINGS: 2:48 a.m. Again noted are sternotomy changes, CABG changes and a TAVR. Stable cardiomegaly. Central vessels are mildly prominent without overt edema. Small pleural effusions and patchy bibasilar airspace disease continue to be seen. The mid and upper lungs are generally clear. The overall aeration seems unchanged. There is aortic tortuosity and atherosclerosis with stable mediastinum. No new osseous findings. IMPRESSION: 1. Stable cardiomegaly with mild central vascular prominence but no overt edema. 2. Small pleural effusions and patchy bibasilar airspace disease continue to be seen. 3. No new or worsening findings. Electronically Signed   By: Mellody Dance  Chesser M.D.   On: 08/10/2023 03:39   US Abdomen Limited RUQ (LIVER/GB) Result Date: 08/21/2023 CLINICAL DATA:  Abdominal pain. EXAM: ULTRASOUND ABDOMEN LIMITED RIGHT UPPER QUADRANT COMPARISON:  CT angiogram 01/01/2020. FINDINGS: Gallbladder: Distended gallbladder with some wall thickening. There also dependent stones. Some gallbladder sludge also seen. Question tiny adjacent fluid. Common bile duct: Diameter: 3 mm Liver: Heterogeneous liver. Portal vein is patent on color Doppler imaging with normal direction of blood flow towards the liver. Other: None. IMPRESSION: Distended gallbladder with sludge,  wall thickening a question tiny amount of adjacent fluid. Please correlate for other clinical evidence of acute cholecystitis if needed further workup such as HIDA scan. No biliary ductal dilatation. Heterogeneous echogenic liver. Electronically Signed   By: Karen Kays M.D.   On: 08/21/2023 10:47   Medications:    Scheduled Medications:  carvedilol  3.125 mg Oral BID WC   Chlorhexidine Gluconate Cloth  6 each Topical Daily   clopidogrel  75 mg Oral Daily   feeding supplement  237 mL Oral BID BM   fluticasone furoate-vilanterol  1 puff Inhalation Daily   And   umeclidinium bromide  1 puff Inhalation Daily   gabapentin  100 mg Oral TID   glimepiride  4 mg Oral Q breakfast   insulin aspart  0-9 Units Subcutaneous TID WC   levothyroxine  150 mcg Oral Q0600   loratadine  10 mg Oral Daily   mupirocin ointment   Nasal BID   polyethylene glycol  17 g Oral BID   QUEtiapine  25 mg Oral QHS   rosuvastatin  40 mg Oral Daily   senna  1 tablet Oral QHS   sodium chloride flush  10-40 mL Intracatheter Q12H   sodium chloride flush  3 mL Intravenous Q12H   Vitamin D (Ergocalciferol)  50,000 Units Oral Weekly   Infusions:  amiodarone 30 mg/hr (08/21/23 2317)   ceFEPime (MAXIPIME) IV 2 g (08/21/23 2216)   heparin 1,500 Units/hr (08/24/2023 0423)   vancomycin 1,500 mg (08/21/23 1130)   PRN Medications: albuterol, nitroGLYCERIN, ondansetron (ZOFRAN) IV, ondansetron (ZOFRAN) IV, sodium chloride flush, sodium chloride flush  Patient Profile   Matthew May is a 78 year old white male with CAD status post CABG in 2011, severe aortic stenosis status post TAVR in 2021, COPD, hyperlipidemia, type 2 diabetes and paroxysmal atrial fibrillation/flutter currently admitted with concerns for cardiogenic shock.   Assessment/Plan   Acute Biventricular HFrEF CAD  - Suspect mixed etiology with CAD and tachy mediated.  - Echo with EF down to 20-25% from previous 70-75% - [facility] 03/13: Patent sequential  SVG-PLV/PDA, RCA known to be occluded, SVG to OM known to be occluded, p LCX occluded, LIMA to LAD w/ 90% stenosis at touchdown to distal LAD (likely culprit, small, not amenable to PCI), upper normal filling pressures with CI 1.97 - TEE showed E 35-40%, mild LVH, moderate RV dysfunction.  - PICC line removed for infectious risk.  - Hold diuresis with concern for sepsis - Hold all GDMT for now with concern for sepsis - Concerned about respiratory compromise with anuria, consult to renal for possible HD. - Long discussion with family about poor prognosis and functional status with severe deconditioning. Palliative Care consult  CAD - h/o CABG 2011 LIMA to LAD, SVG to OM and sequential SVG to PDA and PL branch - Chest pain started a few weeks ago. Suspected completed infart. HS Trop [id number] - LHC as above. Suspect culprit was 90% stenosis LIMA to LAD at touchdown to  distal LAD. Med management recommended - no recurrent chest pain - unable to take PO with AMS. Continue Heparin gtt  Acute Respiratory Distress Acute Encephalopathy Sepsis ?Acute cholecystitis ?Aspiration PNA - CT head negative, ammonia nl - CXR 3/17 w pulm edema > CXR 3/19 improved opacities, with b/l small pleural effusions - WBC 10>31>34 - on HFNC overnight 35L and 50% - Bcx pending - Continue cefepime, ceftriaxone. Stop vanc with renal function. Start Flagyl. - Abd [place] with evidence for acute cholecystitis. HIDA scan ordered. - CMET and Lactic acid STAT   A flutter/Afib with RVR - A Flutter RVR on admit. - s/p TEE/DCCV - In/out AF overnight. NSR this am. - continue eliquis - continue amio gtt  AKI - originally cardiorenal; however worsening in the setting of sepsis - Cr 1.3>1.9>2.12>3.28 - consult to renal for HD discussion - may need to temporize K, CMET STAT  H/o TAVR - Normal valve function on echo this admission.    Code status: DNR  Length of Stay: 9  Matthew Lee, NP  08/18/2023, 8:27 AM  Advanced  Heart Failure Team Pager [phone] (M-F; 7a - 5p)  Please contact [facility] for night-coverage after hours (5p -7a ) and weekends on [email or web address]  Patient seen with NP, I formulated the plan and agree with the above note.   Patient looks significantly worse today.  Minimal UOP with creatinine up to 3.28.  BP stable. Abdomen distended and abdominal US concerning for acute cholecystitis. WBCs up to 34 with PCT 4.15, Tm 100.8. He is on vancomycin/cefepime.   Patient is now on HFNC to maintain oxygen saturation.   He remains in NSR, on amiodarone gtt and heparin gtt.   He will wake up but is confused.   General: Lethargic.  Neck: JVP 8-9, no thyromegaly or thyroid nodule.  Lungs: Crackles bilaterally.  CV: Nondisplaced PMI.  Heart regular S1/S2, no S3/S4, no murmur.  No peripheral edema.   Abdomen: Soft, no hepatosplenomegaly, moderate distention.  Skin: Intact without lesions or rashes.  Neurologic: Drowsy and confused.  Extremities: No clubbing or cyanosis.  HEENT: Normal.   Suspect infection/sepsis with WBCs 34, PCT 4.15.  Possible acute cholecystitis based on RUQ [place]. Also cannot rule out PNA.  - Will try to get HIDA scan to confirm.  - Continue broad spectrum abx.   Acute hypoxemic respiratory failure, now on HFNC.  Creatinine up to 3.28.  I will give a dose of Lasix 80 mg IV to assess response but he has had minimal urine out over the last day.  With lethargy/delirium, we are going to have to consider intubation for airway protection but I worry with his overall picture that this would lead to a prolonged and probably unsuccessful course in the ICU.  I discussed this with his family at bedside.  They are going to think about comfort/palliative care and come to a decision about this soon.  I will involve our palliative care service.   In a similar vein, his renal function is worsening with creatinine up to 3.28 and poor response to Lasix with worsening volume overload/oxygen  requirement.  - Lasix 80 mg IV x 1 now.  - I also told his family that if we proceed aggressively, he will likely need CVVH soon.  He would tolerate HD poorly long-term.  They are leaning towards no HD but would like to speak with nephrologist.   He remains in NSR, continue heparin gtt and amiodarone gtt.   Will need ongoing goals of  care discussions, I worry that aggressive care will ultimately not be successful in this situation.  He has developed multisystem organ failure.   Matthew May 08/05/2023 10:59 AM

## 2023-09-04 NOTE — Consult Note (Addendum)
 [facility] Consultation Note  Requesting MD: Advanced HF team  Reason for consult: AKI   HPI:  Matthew May is a 78 y.o. male.   PMH of CAD, CABG 2011, severe AS s/p TAVR, HTN, COPD, HD, T2DM and PAF on Eiquis.   Presented with chest pain and SOB and admitted for NSTEMI and acute biventricular HF c/b Afib RVR. Diuresed. S/p TEE/DCCV on 3/14. R/LHC showed known occluded RCA and SVG-OM. Proximal Lcx occluded and LIMA-LAD has 90% stenosis, not favorable for PCI so plan for medical management. TEE showed EF 35-40% with RV dysfunction.   Noted overnight 3/18 for respiratory distress needing NRB and then on HHFNC 45L. Worsening leukocytosis, got blood cultures. CXR without worsening findings but noted bibasilar airspace disease. RUQ u/s since noted abdominal distention had c/f cholecystitis. Also had worsening renal function and oliguric. Diuresis was held yesterday given AKI. Prior to admission, renal function normal and Scr was last 0.9-1.0.   Nephrology consulted for AKI and possibly initiation of dialysis.   PMHx:   Past Medical History:  Diagnosis Date   Atrial fibrillation (HCC)    CAD (coronary artery disease)    a. 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   TAVR on 01/20/2020 (29 mm [employer] )   HLD (hyperlipidemia)    HTN (hypertension)    Obesity    Renal insufficiency    Subclavian artery stenosis, right (HCC)    based upon carotid U/S done 11/2012    Past Surgical History:  Procedure Laterality Date   CARDIOVERSION N/A 08/17/2023   Procedure: CARDIOVERSION;  Surgeon: Laurey Morale, MD;  Location:  [facility] INVASIVE CV LAB;  Service: Cardiovascular;  Laterality: N/A;   CATARACT EXTRACTION Left    CORONARY ARTERY BYPASS GRAFT     2011, LIMA to LAD coronary artery, SVG to OM2 branch of lect circumflex coronary artery, and a sequential SVG to psot descening to posterolateral branches to RCA   endoscopic vein harvesting     right leg    HERNIA REPAIR     RIGHT HEART CATH AND CORONARY/GRAFT ANGIOGRAPHY N/A 08/16/2023   Procedure: RIGHT HEART CATH AND CORONARY/GRAFT ANGIOGRAPHY;  Surgeon: Laurey Morale, MD;  Location: [facility] INVASIVE CV LAB;  Service: Cardiovascular;  Laterality: N/A;   RIGHT/LEFT HEART CATH AND CORONARY/GRAFT ANGIOGRAPHY N/A 12/31/2017   Procedure: RIGHT/LEFT HEART CATH AND CORONARY/GRAFT ANGIOGRAPHY;  Surgeon: Marykay Lex, MD;  Location: [facility] INVASIVE CV LAB;  Service: Cardiovascular;  Laterality: N/A;   RIGHT/LEFT HEART CATH AND CORONARY/GRAFT ANGIOGRAPHY N/A 12/30/2019   Procedure: RIGHT/LEFT HEART CATH AND CORONARY/GRAFT ANGIOGRAPHY;  Surgeon: Lyn Records, MD;  Location: [facility] INVASIVE CV LAB;  Service: Cardiovascular;  Laterality: N/A;   TEE WITHOUT CARDIOVERSION N/A 01/20/2020   Procedure: TRANSESOPHAGEAL ECHOCARDIOGRAM (TEE);  Surgeon: Tonny Bollman, MD;  Location: [facility] OR;  Service: Open Heart Surgery;  Laterality: N/A;   TONSILLECTOMY     TRANSCATHETER AORTIC VALVE REPLACEMENT, TRANSFEMORAL N/A 01/20/2020   Procedure: TRANSCATHETER AORTIC VALVE REPLACEMENT, TRANSFEMORALUSING EDWARDS SAPIEN 3 29 MM AORTIC THV.;  Surgeon: Tonny Bollman, MD;  Location: [facility] OR;  Service: Open Heart Surgery;  Laterality: N/A;  TRANSESOPHAGEAL ECHOCARDIOGRAM (CATH LAB) N/A 08/17/2023   Procedure: TRANSESOPHAGEAL ECHOCARDIOGRAM;  Surgeon: Laurey Morale, MD;  Location: [facility] INVASIVE CV LAB;  Service: Cardiovascular;  Laterality: N/A;    Family Hx:  Family History  Problem Relation Age of Onset   Heart disease Father    Heart attack Father    Mental illness Mother    Mental illness Sister     Diabetes Brother    Depression Maternal Aunt     Social History:  reports that he quit smoking about 14 years ago. His smoking use included cigarettes. He has never used smokeless tobacco. He reports that he does not drink alcohol and does not use drugs.  Allergies:  Allergies  Allergen Reactions   Prednisone Other (See Comments)    Pt states "sugar went to 580"   Januvia [Sitagliptin] Nausea And Vomiting   Jardiance [Empagliflozin] Rash    Tolerated Farxiga samples   Lisinopril Cough    Medications: Prior to Admission medications   Medication Sig Start Date End Date Taking? Authorizing Provider  acetaminophen (TYLENOL) 325 MG tablet Take 2 tablets (650 mg total) by mouth every 6 (six) hours as needed for mild pain (or Fever >/= 101). 01/21/20  Yes Roddenberry, Cecille Amsterdam, PA-C  albuterol (VENTOLIN HFA) 108 (90 Base) MCG/ACT inhaler Inhale 1-2 puffs into the lungs every 6 (six) hours as needed for wheezing or shortness of breath. 07/27/23  Yes [name], Christy A, FNP  Budeson-Glycopyrrol-Formoterol (BREZTRI AEROSPHERE) 160-9-4.8 MCG/ACT AERO Inhale 2 puffs into the lungs 2 (two) times daily. 12/28/22  Yes [name], Christy A, FNP  dapagliflozin propanediol (FARXIGA) 10 MG TABS tablet Take 1 tablet (10 mg total) by mouth daily. Patient taking differently: Take 10 mg by mouth in the morning. 07/27/23  Yes [name], Christy A, FNP  ELIQUIS 5 MG TABS tablet Take 1 tablet (5 mg total) by mouth 2 (two) times daily. 07/27/23  Yes [name], Christy A, FNP  gabapentin (NEURONTIN) 100 MG capsule Take 1 capsule (100 mg total) by mouth 3 (three) times daily. 07/27/23  Yes [name], Christy A, FNP  glimepiride (AMARYL) 4 MG tablet Take 1 tablet (4 mg total) by mouth daily with breakfast. 07/27/23  Yes [name], Christy A, FNP  levothyroxine (SYNTHROID) 150 MCG tablet Take 1 tablet (150 mcg total) by mouth daily. 07/27/23  Yes [name], Christy A, FNP  metFORMIN (GLUCOPHAGE) 1000 MG tablet Take 1 tablet (1,000 mg total) by mouth 2  (two) times daily. 07/27/23  Yes [name], Christy A, FNP  potassium chloride SA (KLOR-CON M) 20 MEQ tablet TAKE 1 TABLET BY MOUTH DAILY 01/15/23  Yes Rollene Rotunda, MD  rosuvastatin (CRESTOR) 40 MG tablet Take 1 tablet (40 mg total) by mouth daily. Patient taking differently: Take 40 mg by mouth in the morning. 04/11/23  Yes Rollene Rotunda, MD  torsemide (DEMADEX) 20 MG tablet Take 2 tablets (40 mg total) by mouth daily. Patient taking differently: Take 40 mg by mouth in the morning. 07/27/23  Yes [name], Christy A, FNP  Vitamin D, Ergocalciferol, (DRISDOL) 1.25 MG (50000 UNIT) CAPS capsule Take 1 capsule by mouth once a week Patient taking differently: Take 50,000 Units by mouth once a week. On Wednesday 06/14/23  Yes [name], Neysa Bonito A, FNP  cetirizine (ZYRTEC) 10 MG tablet Take 1 tablet (10 mg total) by mouth daily. Patient not taking: Reported on 07/27/2023 04/07/21   [name] A, FNP  glucose blood (ONETOUCH VERIO) test strip CHECK BLOOD SUGAR DAILY Dx E11.9 03/28/23   [name],  Edilia Bo, FNP  Lancets (ONETOUCH DELICA PLUS LANCET33G) MISC CHECK BLOOD SUGAR DAILY Dx E11.9 08/14/23   [name] A, FNP  nitroGLYCERIN (NITROSTAT) 0.4 MG SL tablet DISSOLVE ONE TABLET UNDER THE TONGUE EVERY 5 MINUTES AS NEEDED FOR CHEST PAIN.  DO NOT EXCEED A TOTAL OF 3 DOSES IN 15 MINUTES 07/27/23   [name], FNP    I have reviewed the patient's current medications.  Labs: Renal Panel: Recent Labs  Lab 08/18/23 0500 08/19/23 0504 08/20/23 0500 08/21/23 0500 08/21/23 1307 08/23/2023 0251  NA 138 136 135 134* 135 134*  K 3.9 4.2 4.5 5.0 5.0 5.3*  CL 96* 96* 96* 95* 98 96*  CO2 30 29 27 26 28  21*  GLUCOSE 137* 144* 100* 126* 133* 96  BUN 23 24* 21 33* 42* 61*  CREATININE 1.53* 1.52* 1.32* 1.90* 2.12* 3.28*  CALCIUM 8.6* 8.5* 8.4* 8.5* 8.4* 8.2*  MG 2.3 2.5* 2.1 2.4  --  2.6*     CBC:    Latest Ref Rng & Units 08/04/2023    2:51 AM 08/20/2023    4:49 PM 08/19/2023    5:04 AM  CBC  WBC 4.0 -  10.5 K/uL 34.4  31.2  10.1   Hemoglobin 13.0 - 17.0 g/dL 62.1  30.8  65.7   Hematocrit 39.0 - 52.0 % 40.5  40.6  38.1   Platelets 150 - 400 K/uL 248  250  201      Anemia Panel:  Recent Labs    08/17/23 0500 08/18/23 0500 08/19/23 0504 08/20/23 1649 09/01/2023 0251  HGB 12.9* 12.4* 11.8* 12.6* 12.6*  MCV 85.8 85.4 87.0 86.9 86.9    Recent Labs  Lab 08/21/23 1307 08/23/2023 0251  AST 42* 60*  ALT 24 30  ALKPHOS 68 74  BILITOT 1.6* 1.1  PROT 7.2 7.1  ALBUMIN 2.3* 2.2*    Lab Results  Component Value Date   HGBA1C 7.5 (H) 07/27/2023    ROS:  Review of systems not obtained due to patient factors.  Physical Exam: Vitals:   08/28/2023 0549 08/19/2023 0826  BP: 105/69 117/60  Pulse: 80 77  Resp: (!) 25 (!) 36  Temp: (!) 100.8 F (38.2 C) 99.5 F (37.5 C)  SpO2: 100% 100%     General exam: awakens to voice but drifts off easily Respiratory system: coarse breath sounds, on HHFNC Cardiovascular system: RRR, no murmur appreciated Gastrointestinal system: bowel sounds present, distended, tenderness of right upper and lower quadrant  Central nervous system: awakens to voice but drifts off easily, oriented to self  Extremities: chronic skin changes of LE and LE edema  Assessment/Plan:  # AKI  # Oliguria  BUN/Cr increased to 61/3.28 this morning. Appears he had normal renal function normal prior to admission. Admitted for acute HFrEF and was diuresed. Transitioned to oral lasix on 3/17. Lasix held on 3/18 due to increasing Scr. Noted decline in UOP past 1-2 days. C/f sepsis ?cholecystitis pending HIDA, CXR without worsening findings but noted bibasilar airspace disease. Started on empiric abx.  - trend renal function - strict I&O - s/p IV lasix, monitor UOP - discussion with wife and daughter, patient would not want dialysis, may consider transition to comfort focused  # Hyperkalemia K of 5.3. Repeat EKG no significant changes, sinus rhythm with 1st AV block and LBBB  seen on prior EKGs. Did get IV lasix this morning.  - trend potassium    # Sepsis  # Acute encephalopathy # Leukocytosis  Tmax  of 100.8 overnight. Increasing WBC to 34. Started empiric abx and got blood cultures.  - on cefepime, vanc, follow cx and further infectious workup   # Acute biventricular HF (EF 35-40%) - HF team following, GDMT held given AKI - received IV lasix 80 today   # CAD s/p CABG # Afib RVR - TRH and cardiology following, on statin, Plavix, amio and heparin drip   # GOC Patient is somnolent today, unable to participate. Palliative team consulted by heart failure team for continued discussion of GOC. Currently DNR/DNI.  - spoke with wife and daughter at bedside this morning, they stated that patient would not want dialysis given current status, uncertain of outcome and if he will tolerate it  - PTA between patient and family, patient had expressed DNR/DNI and that he expressed had a good life even with his ongoing chronic conditions  - I am concerned if patient would tolerate initiation of dialysis, discussed with patient's family and they also agreed, updated TRH  - our team will discuss with advanced HF team about dialysis discussion   Rana Snare 08/04/2023, 9:20 AM

## 2023-09-04 DEATH — deceased

## 2023-09-10 ENCOUNTER — Encounter (HOSPITAL_COMMUNITY): Admitting: Cardiology

## 2023-09-13 NOTE — Telephone Encounter (Signed)
 Spoke with wife today concerning the FMLA paperwork.  She states that she no longer needs this as they have handled it on her end with her work.  I told her to please reach out if there was anything she needed.  We will refund the $29 patient paid for the form.

## 2023-10-23 ENCOUNTER — Encounter: Admitting: Vascular Surgery

## 2023-10-23 ENCOUNTER — Encounter: Payer: Medicare Other | Admitting: Vascular Surgery

## 2023-10-23 ENCOUNTER — Encounter

## 2023-11-01 ENCOUNTER — Ambulatory Visit: Payer: Medicare Other | Admitting: Family

## 2023-11-06 ENCOUNTER — Ambulatory Visit: Payer: Medicare Other
